# Patient Record
Sex: Male | Born: 1950 | Race: Black or African American | Hispanic: No | Marital: Married | Smoking: Former smoker
Health system: Southern US, Community
[De-identification: ages and names within clinical notes are randomized; demographics above are authoritative.]

## PROBLEM LIST (undated history)

## (undated) DIAGNOSIS — I10 Essential (primary) hypertension: Secondary | ICD-10-CM

## (undated) DIAGNOSIS — R413 Other amnesia: Secondary | ICD-10-CM

## (undated) DIAGNOSIS — E119 Type 2 diabetes mellitus without complications: Secondary | ICD-10-CM

## (undated) DIAGNOSIS — K219 Gastro-esophageal reflux disease without esophagitis: Secondary | ICD-10-CM

## (undated) DIAGNOSIS — M1990 Unspecified osteoarthritis, unspecified site: Secondary | ICD-10-CM

## (undated) HISTORY — DX: Essential (primary) hypertension: I10

## (undated) HISTORY — PX: SKIN GRAFT: SHX250

## (undated) HISTORY — PX: FRACTURE SURGERY: SHX138

## (undated) HISTORY — PX: COLONOSCOPY: SHX174

## (undated) HISTORY — PX: ABSCESS DRAINAGE: SHX1119

## (undated) HISTORY — DX: Other amnesia: R41.3

## (undated) HISTORY — PX: PILONIDAL CYST EXCISION: SHX744

## (undated) HISTORY — DX: Type 2 diabetes mellitus without complications: E11.9

---

## 2000-07-14 ENCOUNTER — Encounter: Admission: RE | Admit: 2000-07-14 | Discharge: 2000-07-14 | Payer: Self-pay | Admitting: Family Medicine

## 2000-07-14 ENCOUNTER — Encounter: Payer: Self-pay | Admitting: Family Medicine

## 2002-08-01 ENCOUNTER — Inpatient Hospital Stay (HOSPITAL_COMMUNITY): Admission: EM | Admit: 2002-08-01 | Discharge: 2002-08-03 | Payer: Self-pay | Admitting: Nurse Practitioner

## 2002-08-01 ENCOUNTER — Encounter: Payer: Self-pay | Admitting: Cardiology

## 2002-08-02 ENCOUNTER — Encounter: Payer: Self-pay | Admitting: Cardiology

## 2003-05-17 ENCOUNTER — Emergency Department (HOSPITAL_COMMUNITY): Admission: EM | Admit: 2003-05-17 | Discharge: 2003-05-17 | Payer: Self-pay | Admitting: Emergency Medicine

## 2003-05-20 ENCOUNTER — Emergency Department (HOSPITAL_COMMUNITY): Admission: EM | Admit: 2003-05-20 | Discharge: 2003-05-20 | Payer: Self-pay | Admitting: Emergency Medicine

## 2005-07-14 ENCOUNTER — Ambulatory Visit: Payer: Self-pay | Admitting: Gastroenterology

## 2005-07-22 ENCOUNTER — Encounter (INDEPENDENT_AMBULATORY_CARE_PROVIDER_SITE_OTHER): Payer: Self-pay | Admitting: *Deleted

## 2005-07-22 ENCOUNTER — Ambulatory Visit: Payer: Self-pay | Admitting: Gastroenterology

## 2009-07-21 DIAGNOSIS — K219 Gastro-esophageal reflux disease without esophagitis: Secondary | ICD-10-CM

## 2009-07-21 DIAGNOSIS — R079 Chest pain, unspecified: Secondary | ICD-10-CM | POA: Insufficient documentation

## 2010-10-10 ENCOUNTER — Observation Stay (HOSPITAL_COMMUNITY): Admission: EM | Admit: 2010-10-10 | Discharge: 2009-11-16 | Payer: Self-pay | Admitting: Emergency Medicine

## 2011-01-19 LAB — CBC
HCT: 43.4 % (ref 39.0–52.0)
Hemoglobin: 15.2 g/dL (ref 13.0–17.0)
MCHC: 34.9 g/dL (ref 30.0–36.0)
MCV: 94.8 fL (ref 78.0–100.0)
Platelets: 196 10*3/uL (ref 150–400)
RBC: 4.58 MIL/uL (ref 4.22–5.81)
RDW: 12.2 % (ref 11.5–15.5)
WBC: 10.1 10*3/uL (ref 4.0–10.5)

## 2011-01-19 LAB — BASIC METABOLIC PANEL
BUN: 8 mg/dL (ref 6–23)
CO2: 28 mEq/L (ref 19–32)
Calcium: 8.8 mg/dL (ref 8.4–10.5)
Chloride: 99 mEq/L (ref 96–112)
Creatinine, Ser: 1.02 mg/dL (ref 0.4–1.5)
GFR calc Af Amer: >60 mL/min (ref >60)
GFR calc non Af Amer: >60 mL/min (ref >60)
Glucose, Bld: 111 mg/dL — ABNORMAL HIGH (ref 70–99)
Potassium: 3.8 mEq/L (ref 3.5–5.1)
Sodium: 136 mEq/L (ref 135–145)

## 2011-01-19 LAB — POCT CARDIAC MARKERS
CKMB, poc: <1 ng/mL — ABNORMAL LOW (ref 1.0–8.0)
Myoglobin, poc: 78.5 ng/mL (ref 12–200)
Troponin i, poc: <0.05 ng/mL (ref 0.00–0.09)

## 2011-01-19 LAB — CK TOTAL AND CKMB (NOT AT ARMC)
CK, MB: 0.4 ng/mL (ref 0.3–4.0)
CK, MB: 0.4 ng/mL (ref 0.3–4.0)
Relative Index: 0.4 (ref 0.0–2.5)
Total CK: 105 U/L (ref 7–232)

## 2011-01-19 LAB — TROPONIN I: Troponin I: 0.03 ng/mL (ref 0.00–0.06)

## 2011-03-21 NOTE — Discharge Summary (Signed)
NAME:  Edward Briggs, Edward Briggs NO.:  [id number]   MEDICAL RECORD NO.:  [id number]                   PATIENT TYPE:  INP   LOCATION:  2009                                 FACILITY:  [facility]   PHYSICIAN:  Everardo Beals. Juanda Chance, M.D. [facility]           DATE OF BIRTH:  04-01-1951   DATE OF ADMISSION:  08/01/2002  DATE OF DISCHARGE:  08/03/2002                           DISCHARGE SUMMARY - REFERRING   PROCEDURES:  1. Cardiac catheterization.  2. Coronary arteriogram.  3. Left ventriculogram.  4. A 2D echocardiogram.   HOSPITAL COURSE:  The patient is a 60 year old male with no known  history  of coronary artery disease, who was admitted for intermittent chest pain  that radiated to his shoulder and tingling in his left arm. He was  admitted  to rule out MI and for further evaluation. His enzymes were negative for MI  and he was scheduled for cardiac catheterization. The cardiac  catheterization showed normal coronary arteries with an RCA which was large  and dominant. His ejection fraction was 65% with normal  wall motion. No  further ischemic workup was indicated at this time.   The patient had an echocardiogram to assess valvular function. His EF was  55% to 65%, and he had trivial tricuspid regurgitation but no significant  aortic valvular or mitral valvular regurgitation. There was no pericardial  effusion. The left atrium was mildly dilated at 42 mm with the upper limits  of normal being 40. It was not felt that any further workup was indicated  for this at this time.   The patient had been started on a proton pump inhibitor and remained symptom  free.  Post catheterization his groin was stable and he was ambulating  without difficulty. He was  considered stable for discharge on August 03, 2002.   LABORATORY DATA:  Hemoglobin 13.4, hematocrit 40.1, WBCs 9.5, platelets 216.  Sodium 138, potassium 3.9, chloride 105, CO2 27, BUN 10, creatinine 1.3.  Total cholesterol  161, triglycerides 108, HDL 43, LDL 96.   Chest x-ray:  Heart size and vascularity are normal and the lungs are clear  with no significant bony abnormality.   CONDITION ON DISCHARGE:  Stable.   DISCHARGE DIAGNOSES:  1. Chest pain, possibility secondary to reflux symptoms with no     abnormalities on chest x-ray or cardiac catheterization.  2. Possible gastroesophageal reflux disease symptoms.   DISCHARGE INSTRUCTIONS:  His activity  level is to include no driving,  sexual or strenuous activity for two days. He is to stick to a low fat diet.  He is to call  the office for problems with the catheterization site.    FOLLOW UP:  He has a followup appointment with the P.A. for a groin check on  August 15, 2002, at 10 a.m. He is to see Dr. Margrett Rud for followup.   DISCHARGE MEDICATIONS:  Protonix 40 mg q.d.  Lavella Hammock, P.A. [facility]                  Bruce R. Juanda Chance, M.D. [facility]    RG/MEDQ  D:  08/03/2002  T:  08/06/2002  Job:  [id number]   cc:   Vale Haven. Andrey Campanile, M.D.   Bruce Elvera Lennox Juanda Chance, M.D. [facility]  [address]  [place]  [place] [address]

## 2011-03-21 NOTE — H&P (Signed)
NAME:  Edward Briggs, Edward Briggs NO.:  [id number]   MEDICAL RECORD NO.:  [id number]                   PATIENT TYPE:  INP   LOCATION:  1830                                 FACILITY:  [facility]   PHYSICIAN:  Everardo Beals. Juanda Chance, M.D. [facility]           DATE OF BIRTH:  1951-09-18   DATE OF ADMISSION:  08/01/2002  DATE OF DISCHARGE:                                HISTORY & PHYSICAL   CHIEF COMPLAINT:  Left shoulder, jaw, and chest pain.   CLINICAL HISTORY:  Mr. Meiklejohn is a 60 year old gentleman with no prior history  of known heart disease. Over the past two weeks, he has had intermittent  pain in his left jaw and left shoulder and some pain in his mid to left  chest. This comes on without any provocation and lasts about 10 minutes.  Sometimes his symptoms can be in his jaw, sometimes in his shoulder, and  sometimes in his chest. There is no relation to exertion. There is no  associated shortness of breath, diaphoresis, or nausea. He saw Dr. Earl Lites in Urgent Care today, and he performed an EKG which showed ST elevation  in V1 and V2 and some lateral depression in V4 to V6. He was concerned his  symptoms might represent unstable angina and arranged for him to come to the  emergency room and be seen by [place]. He was not having any pain bilateral in  the emergency room.   PAST MEDICAL HISTORY:  Negative for any major illnesses, and he is currently  taking no medications. There is no history of hypertension, diabetes, or  hyperlipidemia and no prior surgeries.   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  He quit smoking about 15 years ago. For details of social history, family  history, and review of systems, please see Annette Stable Yearn's complete note.   PHYSICAL EXAMINATION:  VITAL SIGNS:  Blood pressure 120/80, pulse 70 and  regular. There was no venous distention. Carotid pulses were full without  bruits.  CHEST:  Clear. There were no rales or  rhonchi.  CARDIAC:  Rhythm was regular. The first and second heart sounds were normal,  and there were no murmurs or gallops.  ABDOMEN:  Soft without organomegaly. No pulsatile masses, and the bowel  sounds were normal.  EXTREMITIES:  The peripheral pulses were full, and there was no peripheral  edema.  MUSCULOSKELETAL SYSTEM:  Shows no deformities.  SKIN:  Warm and dry.  NEUROLOGIC EXAM:  Showed no focal signs.   LABORATORY DATA:  His EKG showed slight ST elevation of 1 to 1.5 mm in V1 to  V3 and lateral T wave inversions from V4 to V6 and in III and aVF.   IMPRESSION:  Chest, shoulder, and jaw pain and abnormal EKG-possible  unstable angina, rule out other causes.    RECOMMENDATIONS:  We will plan  to admit the patient for observation. We will  get serial enzymes and ECGs. We will evaluate the patient with an  echocardiogram to rule out a cardiomyopathy and will plan to evaluate him  with cardiac catheterization tomorrow to rule out ischemic heart disease. We  will treat him with Lovenox and aspirin in the interim.                                                 Bruce Elvera Lennox Juanda Chance, M.D. [facility]    BRB/MEDQ  D:  08/01/2002  T:  08/03/2002  Job:  [id number]   cc:   Brett Canales A. Cleta Alberts, M.D.   Cardiopulmonary lab   [facility]

## 2011-03-21 NOTE — Cardiovascular Report (Signed)
   NAME:  Edward Briggs, Edward Briggs NO.:  [id number]   MEDICAL RECORD NO.:  [id number]                   PATIENT TYPE:  INP   LOCATION:  2009                                 FACILITY:  [facility]   PHYSICIAN:  Rollene Rotunda, M.D. [facility]            DATE OF BIRTH:  Mar 17, 1951   DATE OF PROCEDURE:  08/02/2002  DATE OF DISCHARGE:                              CARDIAC CATHETERIZATION   PROCEDURE:  Left heart catheterization/coronary arteriography.   INDICATION:  Evaluate patient with chest pain and an abnormal EKG.   PROCEDURAL NOTE:  Left heart catheterization was performed via the right  femoral artery.  The artery was cannulated using anterior wall puncture.  A  #6-French arterial sheath was inserted via the modified Seldinger technique.  Preformed Judkins and a pigtail catheter were utilized.  The patient  tolerated the procedure well and left the lab in stable condition.   RESULTS:  Hemodynamics:  LV 144/25, AO 148/100.   Coronaries:  The left main was normal.  The LAD was normal.  The circumflex  was normal.  The right coronary artery was a large dominant vessel and was  normal.   Left ventriculogram:  The left ventriculogram was obtained in the RAO  projection.  The EF was 65% with normal wall motion.   CONCLUSION:  Normal coronary arteries.  Normal left ventricular function.   PLAN:  No further cardiac workup is planned.  The patient will be encouraged  to follow up with his primary care physician for evaluation of recurrent  chest discomfort, which is obviously of non-anginal etiology.                                               Rollene Rotunda, M.D. [facility]    JH/MEDQ  D:  08/02/2002  T:  08/06/2002  Job:  [id number]   cc:   Brett Canales A. Cleta Alberts, M.D.

## 2011-11-17 ENCOUNTER — Emergency Department (HOSPITAL_COMMUNITY): Payer: Worker's Compensation

## 2011-11-17 ENCOUNTER — Encounter (HOSPITAL_COMMUNITY): Payer: Self-pay

## 2011-11-17 ENCOUNTER — Emergency Department (HOSPITAL_COMMUNITY)
Admission: EM | Admit: 2011-11-17 | Discharge: 2011-11-17 | Disposition: A | Discharge status: Home / Self Care | Payer: Worker's Compensation | Attending: Emergency Medicine | Admitting: Emergency Medicine

## 2011-11-17 DIAGNOSIS — X500XXA Overexertion from strenuous movement or load, initial encounter: Secondary | ICD-10-CM | POA: Insufficient documentation

## 2011-11-17 DIAGNOSIS — M25476 Effusion, unspecified foot: Secondary | ICD-10-CM | POA: Insufficient documentation

## 2011-11-17 DIAGNOSIS — M25473 Effusion, unspecified ankle: Secondary | ICD-10-CM | POA: Insufficient documentation

## 2011-11-17 DIAGNOSIS — R269 Unspecified abnormalities of gait and mobility: Secondary | ICD-10-CM | POA: Insufficient documentation

## 2011-11-17 DIAGNOSIS — M25579 Pain in unspecified ankle and joints of unspecified foot: Secondary | ICD-10-CM | POA: Insufficient documentation

## 2011-11-17 DIAGNOSIS — S8253XA Displaced fracture of medial malleolus of unspecified tibia, initial encounter for closed fracture: Secondary | ICD-10-CM

## 2011-11-17 MED ORDER — OXYCODONE-ACETAMINOPHEN 5-325 MG PO TABS: 1.0000 | ORAL_TABLET | Freq: Four times a day (QID) | ORAL | Status: AC | PRN

## 2011-11-17 MED ORDER — HYDROMORPHONE HCL PF 2 MG/ML IJ SOLN
1.0000 mg | Freq: Once | INTRAMUSCULAR | Status: AC
  Administered 2011-11-17: 1 mg via INTRAMUSCULAR
  Filled 2011-11-17: qty 1

## 2011-11-17 NOTE — Progress Notes (Signed)
Orthopedic Tech Progress Note Patient Details:  Edward Briggs Sep 23, 1951 [id number]  Type of Splint: Stirrup;Post (short) Splint Location: applied prefab splint to left leg Splint Interventions: Application crutches   Gaye Pollack 11/17/2011, 10:21 AM

## 2011-11-17 NOTE — ED Notes (Signed)
Pt transported to and from radiology on stretcher with tech and tolerated well. 

## 2011-11-17 NOTE — ED Notes (Signed)
Pt presents with swelling and pain to L ankle after slipping in water at work.  Pt reports turning ankle.  +pedal pulse to L foot, +sensation and movement.  Pt reports he is unable to bear weight.  +swelling from L foot up to L knee that is pitting.

## 2011-11-17 NOTE — ED Notes (Signed)
Ice applied while pt awaiting imaging result.

## 2011-11-17 NOTE — ED Notes (Signed)
Pt presents with L ankle and foot pain after slipping in water in parking deck at work.  Pt unable to bear weight.

## 2011-11-17 NOTE — ED Provider Notes (Signed)
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 States his walking and somehow tripped. Reports twisting his ankle inward. Reports immediately begin to swell and was unable to bear weight on his ankle. Denies numbness, tingling. Patient is a 61 y.o. male presenting with ankle pain. The history is provided by the patient.  Ankle Pain  The incident occurred 1 to 2 hours ago. The incident occurred at work. The injury mechanism was a fall. The pain is present in the left ankle. The pain is severe. Associated symptoms include inability to bear weight and loss of motion. Pertinent negatives include no numbness, no muscle weakness, no loss of sensation and no tingling. He reports no foreign bodies present. The symptoms are aggravated by activity, bearing weight and palpation.    History reviewed. No pertinent past medical history.  Past Surgical History  Procedure Date  . Skin graft   . Abscess drainage     No family history on file.  History  Substance Use Topics  . Smoking status: Former Games developer  . Smokeless tobacco: Not on file  . Alcohol Use: Yes      Review of Systems  Musculoskeletal: Positive for joint swelling and gait problem. Negative for back pain.       Ankle pain and swelling   Skin: Negative for color change and wound.  Neurological: Negative for tingling, weakness and numbness.  All other systems reviewed and are negative.    Allergies  Review of patient's allergies indicates no known allergies.  Home Medications   Current Outpatient Rx  Name Route Sig Dispense Refill  . NAPROXEN SODIUM 220 MG PO TABS Oral Take 440 mg by mouth 2 (two) times daily as needed. For pain    . PSEUDOEPHEDRINE-GUAIFENESIN ER 60-600 MG PO TB12 Oral Take 1 tablet by mouth daily as needed. For congestion with cold    . OXYCODONE-ACETAMINOPHEN 5-325  MG PO TABS Oral Take 1 tablet by mouth every 6 (six) hours as needed for pain. 30 tablet 0    BP 149/90  Pulse 86  Temp(Src) 97.5 F (36.4 C) (Oral)  Resp 14  SpO2 99%  Physical Exam  Constitutional: He is oriented to person, place, and time. He appears well-developed and well-nourished.  HENT:  Head: Normocephalic and atraumatic.  Eyes: Pupils are equal, round, and reactive to light.  Musculoskeletal:       Left ankle: He exhibits decreased range of motion and swelling. He exhibits no ecchymosis, no deformity and normal pulse. tenderness. Medial malleolus and AITFL tenderness found. No lateral malleolus, no CF ligament, no posterior TFL, no head of 5th metatarsal and no proximal fibula tenderness found. Achilles tendon normal.       Feet:  Neurological: He is alert and oriented to person, place, and time.  Skin: Skin is warm and dry. No rash noted. No erythema. No pallor.  Psychiatric: He has a normal mood and affect. His behavior is normal.    ED Course  Procedures   Labs Reviewed - No data to display Dg Ankle Complete Left  11/17/2011  *RADIOLOGY REPORT*  Clinical Data: Twisting injury, swelling, pain.  LEFT ANKLE COMPLETE - 3+ VIEW  Comparison: None.  Findings: There is a transverse fracture through the medial malleolus.  Widening of the medial ankle mortise noted.  No fibular abnormality seen.  Overlying soft tissue swelling.  IMPRESSION: Transverse fracture through the medial malleolus with widening of the joint space medially.  Original Report Authenticated By: Cyndie Chime, M.D.     1. Fracture of medial malleolus, closed       MDM  Patient placed in a posterior/stirrup splint recommended orthopedic physician after swelling has decreased. Patient's family agree with plan to.       Thomasene Lot, PA-C 11/17/11 [phone]

## 2011-11-18 NOTE — ED Provider Notes (Signed)
Medical screening examination/treatment/procedure(s) were performed by non-physician practitioner and as supervising physician I was immediately available for consultation/collaboration.    Makana Rostad R Analiza Cowger, MD 11/18/11 0707 

## 2011-12-01 ENCOUNTER — Encounter (HOSPITAL_BASED_OUTPATIENT_CLINIC_OR_DEPARTMENT_OTHER): Payer: Self-pay | Admitting: *Deleted

## 2011-12-02 ENCOUNTER — Other Ambulatory Visit: Payer: Self-pay | Admitting: Physician Assistant

## 2011-12-03 ENCOUNTER — Encounter (HOSPITAL_BASED_OUTPATIENT_CLINIC_OR_DEPARTMENT_OTHER): Payer: Self-pay | Admitting: Anesthesiology

## 2011-12-03 ENCOUNTER — Ambulatory Visit (HOSPITAL_BASED_OUTPATIENT_CLINIC_OR_DEPARTMENT_OTHER)
Admission: RE | Admit: 2011-12-03 | Discharge: 2011-12-03 | Disposition: A | Discharge status: Home / Self Care | Payer: Worker's Compensation | Source: Ambulatory Visit | Attending: Orthopedic Surgery | Admitting: Orthopedic Surgery

## 2011-12-03 ENCOUNTER — Encounter (HOSPITAL_BASED_OUTPATIENT_CLINIC_OR_DEPARTMENT_OTHER)
Admission: RE | Disposition: A | Discharge status: Home / Self Care | Payer: Self-pay | Source: Ambulatory Visit | Attending: Orthopedic Surgery

## 2011-12-03 ENCOUNTER — Ambulatory Visit (HOSPITAL_BASED_OUTPATIENT_CLINIC_OR_DEPARTMENT_OTHER): Payer: Worker's Compensation | Admitting: Anesthesiology

## 2011-12-03 DIAGNOSIS — Y9269 Other specified industrial and construction area as the place of occurrence of the external cause: Secondary | ICD-10-CM | POA: Insufficient documentation

## 2011-12-03 DIAGNOSIS — S8253XA Displaced fracture of medial malleolus of unspecified tibia, initial encounter for closed fracture: Secondary | ICD-10-CM | POA: Insufficient documentation

## 2011-12-03 DIAGNOSIS — S93439A Sprain of tibiofibular ligament of unspecified ankle, initial encounter: Secondary | ICD-10-CM | POA: Insufficient documentation

## 2011-12-03 DIAGNOSIS — Y990 Civilian activity done for income or pay: Secondary | ICD-10-CM | POA: Insufficient documentation

## 2011-12-03 DIAGNOSIS — X500XXA Overexertion from strenuous movement or load, initial encounter: Secondary | ICD-10-CM | POA: Insufficient documentation

## 2011-12-03 DIAGNOSIS — S82409A Unspecified fracture of shaft of unspecified fibula, initial encounter for closed fracture: Secondary | ICD-10-CM | POA: Insufficient documentation

## 2011-12-03 DIAGNOSIS — K219 Gastro-esophageal reflux disease without esophagitis: Secondary | ICD-10-CM | POA: Insufficient documentation

## 2011-12-03 DIAGNOSIS — S82892A Other fracture of left lower leg, initial encounter for closed fracture: Secondary | ICD-10-CM

## 2011-12-03 HISTORY — DX: Gastro-esophageal reflux disease without esophagitis: K21.9

## 2011-12-03 HISTORY — PX: ORIF ANKLE FRACTURE: SHX5408

## 2011-12-03 SURGERY — OPEN REDUCTION INTERNAL FIXATION (ORIF) ANKLE FRACTURE
Anesthesia: General | Site: Leg Lower | Laterality: Left | Wound class: Clean

## 2011-12-03 MED ORDER — CEFAZOLIN SODIUM 1-5 GM-% IV SOLN: 1.0000 g | INTRAVENOUS | Status: DC

## 2011-12-03 MED ORDER — VITAMIN C 500 MG PO TABS: 500.0000 mg | ORAL_TABLET | Freq: Every day | ORAL | Status: AC

## 2011-12-03 MED ORDER — DEXAMETHASONE SODIUM PHOSPHATE 4 MG/ML IJ SOLN
INTRAMUSCULAR | Status: DC | PRN
  Administered 2011-12-03: 10 mg via INTRAVENOUS

## 2011-12-03 MED ORDER — MIDAZOLAM HCL 2 MG/2ML IJ SOLN
1.0000 mg | INTRAMUSCULAR | Status: DC | PRN
  Administered 2011-12-03: 2 mg via INTRAVENOUS

## 2011-12-03 MED ORDER — METHOCARBAMOL 500 MG PO TABS: 500.0000 mg | ORAL_TABLET | Freq: Three times a day (TID) | ORAL | Status: AC

## 2011-12-03 MED ORDER — PROPOFOL 10 MG/ML IV EMUL
INTRAVENOUS | Status: DC | PRN
  Administered 2011-12-03: 250 mg via INTRAVENOUS

## 2011-12-03 MED ORDER — OXYCODONE-ACETAMINOPHEN 5-325 MG PO TABS
1.0000 | ORAL_TABLET | Freq: Once | ORAL | Status: AC
  Administered 2011-12-03: 2 via ORAL

## 2011-12-03 MED ORDER — SODIUM CHLORIDE 0.9 % IV SOLN: INTRAVENOUS | Status: DC

## 2011-12-03 MED ORDER — HYDROMORPHONE HCL PF 1 MG/ML IJ SOLN
0.2500 mg | INTRAMUSCULAR | Status: DC | PRN
  Administered 2011-12-03 (×3): 0.5 mg via INTRAVENOUS

## 2011-12-03 MED ORDER — FENTANYL CITRATE 0.05 MG/ML IJ SOLN
50.0000 ug | INTRAMUSCULAR | Status: DC | PRN
  Administered 2011-12-03: 100 ug via INTRAVENOUS

## 2011-12-03 MED ORDER — CEFAZOLIN SODIUM-DEXTROSE 2-3 GM-% IV SOLR
2.0000 g | INTRAVENOUS | Status: AC
  Administered 2011-12-03: 2 g via INTRAVENOUS

## 2011-12-03 MED ORDER — CHLORHEXIDINE GLUCONATE 4 % EX LIQD: 60.0000 mL | Freq: Once | CUTANEOUS | Status: DC

## 2011-12-03 MED ORDER — PROMETHAZINE HCL 25 MG/ML IJ SOLN: 6.2500 mg | INTRAMUSCULAR | Status: DC | PRN

## 2011-12-03 MED ORDER — LACTATED RINGERS IV SOLN
INTRAVENOUS | Status: DC
  Administered 2011-12-03 (×3): via INTRAVENOUS

## 2011-12-03 MED ORDER — FENTANYL CITRATE 0.05 MG/ML IJ SOLN
INTRAMUSCULAR | Status: DC | PRN
Start: 2011-12-03 — End: 2011-12-03
  Administered 2011-12-03: 25 ug via INTRAVENOUS
  Administered 2011-12-03: 100 ug via INTRAVENOUS
  Administered 2011-12-03: 25 ug via INTRAVENOUS
  Administered 2011-12-03: 50 ug via INTRAVENOUS

## 2011-12-03 MED ORDER — BUPIVACAINE HCL (PF) 0.5 % IJ SOLN
INTRAMUSCULAR | Status: DC | PRN
  Administered 2011-12-03: 10 mL

## 2011-12-03 MED ORDER — ONDANSETRON HCL 4 MG/2ML IJ SOLN
INTRAMUSCULAR | Status: DC | PRN
  Administered 2011-12-03: 4 mg via INTRAVENOUS

## 2011-12-03 MED ORDER — ASPIRIN EC 325 MG PO TBEC: 325.0000 mg | DELAYED_RELEASE_TABLET | Freq: Two times a day (BID) | ORAL | Status: AC

## 2011-12-03 MED ORDER — OXYCODONE-ACETAMINOPHEN 5-325 MG PO TABS
1.0000 | ORAL_TABLET | ORAL | Status: DC | PRN
  Administered 2011-12-03: 1 via ORAL

## 2011-12-03 MED ORDER — BUPIVACAINE-EPINEPHRINE PF 0.5-1:200000 % IJ SOLN
INTRAMUSCULAR | Status: DC | PRN
  Administered 2011-12-03: 30 mL

## 2011-12-03 SURGICAL SUPPLY — 71 items
BANDAGE ELASTIC 4 VELCRO ST LF (GAUZE/BANDAGES/DRESSINGS) IMPLANT
BANDAGE ELASTIC 6 VELCRO ST LF (GAUZE/BANDAGES/DRESSINGS) ×3 IMPLANT
BIT DRILL 2.5X110 QC LCP DISP (BIT) ×3 IMPLANT
BIT DRILL Q/COUPLING 1 (BIT) ×3 IMPLANT
BIT DRILL QC 3.5X195 (BIT) IMPLANT
BLADE SURG 15 STRL LF DISP TIS (BLADE) ×8 IMPLANT
BLADE SURG 15 STRL SS (BLADE) ×4
BRUSH SCRUB EZ PLAIN DRY (MISCELLANEOUS) ×3 IMPLANT
BUR EGG 3PK/BX (BURR) IMPLANT
CLOTH BEACON ORANGE TIMEOUT ST (SAFETY) ×3 IMPLANT
COTTON STERILE ROLL (GAUZE/BANDAGES/DRESSINGS) ×3 IMPLANT
COVER TABLE BACK 60X90 (DRAPES) ×3 IMPLANT
CUFF TOURNIQUET SINGLE 34IN LL (TOURNIQUET CUFF) ×3 IMPLANT
DRAPE EXTREMITY T 121X128X90 (DRAPE) ×3 IMPLANT
DRAPE OEC MINIVIEW 54X84 (DRAPES) ×3 IMPLANT
DRAPE SURG 17X23 STRL (DRAPES) ×3 IMPLANT
DRSG PAD ABDOMINAL 8X10 ST (GAUZE/BANDAGES/DRESSINGS) IMPLANT
DURA STEPPER LG (CAST SUPPLIES) IMPLANT
DURA STEPPER MED (CAST SUPPLIES) IMPLANT
ELECT REM PT RETURN 9FT ADLT (ELECTROSURGICAL) ×3
ELECTRODE REM PT RTRN 9FT ADLT (ELECTROSURGICAL) ×2 IMPLANT
GAUZE SPONGE 4X4 16PLY XRAY LF (GAUZE/BANDAGES/DRESSINGS) IMPLANT
GAUZE XEROFORM 1X8 LF (GAUZE/BANDAGES/DRESSINGS) ×3 IMPLANT
GAUZE XEROFORM 5X9 LF (GAUZE/BANDAGES/DRESSINGS) IMPLANT
GLOVE BIO SURGEON STRL SZ 6.5 (GLOVE) ×3 IMPLANT
GLOVE BIO SURGEON STRL SZ8 (GLOVE) ×3 IMPLANT
GLOVE BIOGEL PI IND STRL 8 (GLOVE) ×4 IMPLANT
GLOVE BIOGEL PI INDICATOR 8 (GLOVE) ×2
GLOVE INDICATOR 7.0 STRL GRN (GLOVE) ×3 IMPLANT
GLOVE SURG SS PI 8.0 STRL IVOR (GLOVE) ×3 IMPLANT
GOWN BRE IMP PREV XXLGXLNG (GOWN DISPOSABLE) ×6 IMPLANT
GOWN PREVENTION PLUS XLARGE (GOWN DISPOSABLE) ×3 IMPLANT
K-WIRE 2.0X150M (WIRE) ×3
KIT 1/3 TUB PL 4H 49M (Orthopedic Implant) ×2 IMPLANT
KWIRE 2.0X150M (WIRE) ×2 IMPLANT
NEEDLE HYPO 22GX1.5 SAFETY (NEEDLE) IMPLANT
NS IRRIG 1000ML POUR BTL (IV SOLUTION) ×3 IMPLANT
PACK BASIN DAY SURGERY FS (CUSTOM PROCEDURE TRAY) ×3 IMPLANT
PAD CAST 4YDX4 CTTN HI CHSV (CAST SUPPLIES) ×2 IMPLANT
PADDING CAST ABS 4INX4YD NS (CAST SUPPLIES) ×1
PADDING CAST ABS COTTON 4X4 ST (CAST SUPPLIES) ×2 IMPLANT
PADDING CAST COTTON 4X4 STRL (CAST SUPPLIES) ×1
PENCIL BUTTON HOLSTER BLD 10FT (ELECTRODE) ×3 IMPLANT
PROS 1/3 TUB PL 4H 49M (Orthopedic Implant) ×3 IMPLANT
SCREW CORT ST 4.5X50 (Screw) ×3 IMPLANT
SCREW CORTEX 3.5 34MM (Screw) ×1 IMPLANT
SCREW CORTEX 3.5 38MM (Screw) ×1 IMPLANT
SCREW CORTEX ST 4.5X60 (Screw) ×3 IMPLANT
SCREW CORTEX ST 4.5X62 (Screw) IMPLANT
SCREW LOCK CORT ST 3.5X34 (Screw) ×2 IMPLANT
SCREW LOCK CORT ST 3.5X38 (Screw) ×2 IMPLANT
SHEET MEDIUM DRAPE 40X70 STRL (DRAPES) ×6 IMPLANT
SLEEVE SCD COMPRESS KNEE MED (MISCELLANEOUS) ×3 IMPLANT
SPLINT FAST PLASTER 5X30 (CAST SUPPLIES) ×20
SPLINT PLASTER CAST FAST 5X30 (CAST SUPPLIES) ×40 IMPLANT
SPONGE GAUZE 4X4 12PLY (GAUZE/BANDAGES/DRESSINGS) ×3 IMPLANT
STOCKINETTE 6  STRL (DRAPES) ×1
STOCKINETTE 6 STRL (DRAPES) ×2 IMPLANT
SUCTION FRAZIER TIP 10 FR DISP (SUCTIONS) ×3 IMPLANT
SUT ETHILON 3 0 PS 1 (SUTURE) IMPLANT
SUT ETHILON 4 0 PS 2 18 (SUTURE) ×9 IMPLANT
SUT VIC AB 2-0 SH 27 (SUTURE)
SUT VIC AB 2-0 SH 27XBRD (SUTURE) IMPLANT
SUT VIC AB 3-0 PS1 18 (SUTURE) ×2
SUT VIC AB 3-0 PS1 18XBRD (SUTURE) ×4 IMPLANT
SYR BULB 3OZ (MISCELLANEOUS) ×3 IMPLANT
TOWEL OR 17X24 6PK STRL BLUE (TOWEL DISPOSABLE) ×9 IMPLANT
TOWEL OR NON WOVEN STRL DISP B (DISPOSABLE) ×3 IMPLANT
TUBE CONNECTING 20X1/4 (TUBING) ×6 IMPLANT
UNDERPAD 30X30 INCONTINENT (UNDERPADS AND DIAPERS) ×3 IMPLANT
WATER STERILE IRR 1000ML POUR (IV SOLUTION) ×3 IMPLANT

## 2011-12-03 NOTE — Anesthesia Procedure Notes (Addendum)
Anesthesia Regional Block:  Popliteal block  Pre-Anesthetic Checklist: ,, timeout performed, Correct Patient, Correct Site, Correct Laterality, Correct Procedure, Correct Position, site marked, Risks and benefits discussed,  Surgical consent,  Pre-op evaluation,  At surgeon's request and post-op pain management  Laterality: Left  Prep: chloraprep       Needles:  Injection technique: Single-shot  Needle Type: Stimulator Needle - 80      Needle Gauge: 22 and 22 G    Additional Needles:  Procedures: nerve stimulator Popliteal block  Nerve Stimulator or Paresthesia:  Response: toe abduction, 0.45 mA, 0.1 ms,   Additional Responses:   Narrative:  Start time: 12/03/2011 10:40 AM End time: 12/03/2011 10:46 AM Injection made incrementally with aspirations every 5 mL.  Performed by: Personally  Anesthesiologist: Sandford Craze, MD  Additional Notes: Pt identified in Holding room.  Monitors applied. Working IV access confirmed. Sterile prep.  #22ga PNS to toe twitch at 0.16mA threshold.  30cc 0.5% Bupivacaine with 1:200k epi injected incrementally after negative test dose.  Additional 10cc 0.5% Bupivacaine injected subq at prox, ant-med tibia for saphenous nerve supplementation. Patient asymptomatic, VSS, no heme aspirated, tolerated well.   Sandford Craze, MD   Procedure Name: LMA Insertion Date/Time: 12/03/2011 10:56 AM Performed by: Jearld Shines Pre-anesthesia Checklist: Patient identified, Emergency Drugs available, Suction available and Patient being monitored Patient Re-evaluated:Patient Re-evaluated prior to inductionOxygen Delivery Method: Circle System Utilized Preoxygenation: Pre-oxygenation with 100% oxygen Intubation Type: IV induction Ventilation: Mask ventilation without difficulty LMA: LMA with gastric port inserted LMA Size: 4.0 and 5.0 Number of attempts: 1 Airway Equipment and Method: bite block Placement Confirmation: positive ETCO2 Tube secured with: Tape Dental  Injury: Teeth and Oropharynx as per pre-operative assessment

## 2011-12-03 NOTE — Progress Notes (Signed)
Assisted Dr. Jackson with left, popliteal block. Side rails up, monitors on throughout procedure. See vital signs in flow sheet. Tolerated Procedure well. 

## 2011-12-03 NOTE — Brief Op Note (Signed)
12/03/2011  11:11 AM  PATIENT:  Edward Briggs  61 y.o. male  PRE-OPERATIVE DIAGNOSIS:  left medial malleolus fracture  POST-OPERATIVE DIAGNOSIS:  * No post-op diagnosis entered *  PROCEDURE:  Procedure(s): OPEN REDUCTION INTERNAL FIXATION (ORIF) ANKLE FRACTURE OPEN REDUCTION INTERNAL FIXATION (ORIF) TIBIA/FIBULA FRACTURE  SURGEON:  Surgeon(s): Sherri Rad, MD  PHYSICIAN ASSISTANT: Rexene Edison, PAC  ASSISTANTS: Above   ANESTHESIA:   general  EBL:  Total I/O In: 200 [I.V.:200] Out: -   BLOOD ADMINISTERED:none  DRAINS: none   LOCAL MEDICATIONS USED:  NONE  SPECIMEN:  No Specimen  DISPOSITION OF SPECIMEN:  N/A  COUNTS:  YES  TOURNIQUET:  * Missing tourniquet times found for documented tourniquets in log:  21315 *  DICTATION: .Other Dictation: Dictation Number [id number]  PLAN OF CARE: Discharge to home after PACU  PATIENT DISPOSITION:  PACU - hemodynamically stable.   Delay start of Pharmacological VTE agent (>24hrs) due to surgical blood loss or risk of bleeding:  {YES/NO/NOT APPLICABLE:20182

## 2011-12-03 NOTE — Anesthesia Preprocedure Evaluation (Signed)
Anesthesia Evaluation  Patient identified by MRN, date of birth, ID band Patient awake    Reviewed: Allergy & Precautions, H&P , NPO status , Patient's Chart, lab work & pertinent test results  History of Anesthesia Complications Negative for: history of anesthetic complications  Airway       Dental   Pulmonary former smoker         Cardiovascular neg cardio ROS  ECHO '03- normal LVF, normal valves   Neuro/Psych Negative Neurological ROS     GI/Hepatic Neg liver ROS, GERD-  Controlled,  Endo/Other  Morbid obesity  Renal/GU negative Renal ROS     Musculoskeletal   Abdominal   Peds  Hematology   Anesthesia Other Findings   Reproductive/Obstetrics                           Anesthesia Physical Anesthesia Plan  ASA: II  Anesthesia Plan: General   Post-op Pain Management:    Induction: Intravenous  Airway Management Planned: LMA  Additional Equipment:   Intra-op Plan:   Post-operative Plan:   Informed Consent: I have reviewed the patients History and Physical, chart, labs and discussed the procedure including the risks, benefits and alternatives for the proposed anesthesia with the patient or authorized representative who has indicated his/her understanding and acceptance.   Dental advisory given  Plan Discussed with: CRNA and Surgeon  Anesthesia Plan Comments: (Plan routine monitors, GA- LMA OK, popliteal block for post op analgesia)        Anesthesia Quick Evaluation

## 2011-12-03 NOTE — Transfer of Care (Signed)
Immediate Anesthesia Transfer of Care Note  Patient: Edward Briggs  Procedure(s) Performed:  OPEN REDUCTION INTERNAL FIXATION (ORIF) ANKLE FRACTURE - left medial malleolus fracture; OPEN REDUCTION INTERNAL FIXATION (ORIF) TIBIA/FIBULA FRACTURE - distal tibial/fibula, syndesmosis closed fracture with manipulation of fibula shaft   Patient Location: PACU  Anesthesia Type: GA combined with regional for post-op pain  Level of Consciousness: sedated  Airway & Oxygen Therapy: Patient Spontanous Breathing and Patient connected to face mask oxygen  Post-op Assessment: Report given to PACU RN and Post -op Vital signs reviewed and stable  Post vital signs: Reviewed and stable  Complications: No apparent anesthesia complications

## 2011-12-03 NOTE — Op Note (Signed)
NAME:  JEFFERY, BACHMEIER NO.:  MEDICAL RECORD NO.:  [id number]  LOCATION:                                 FACILITY:  PHYSICIAN:  Leonides Grills, M.D.     DATE OF BIRTH:  11/02/1951  DATE OF PROCEDURE:  12/03/2011 DATE OF DISCHARGE:                              OPERATIVE REPORT   PREOPERATIVE DIAGNOSES: 1. Left medial malleolus fracture. 2. Left fibular shaft fracture. 3. Left distal tibiofibular syndesmotic rupture.  POSTOPERATIVE DIAGNOSES: 1. Left medial malleolus fracture. 2. Left fibular shaft fracture. 3. Left distal tibiofibular syndesmotic rupture.  OPERATION: 1. Open reduction and internal fixation of left medial malleolus     fracture. 2. Open reduction and internal fixation of left distal tibiofibular     syndesmotic rupture. 3. Closed treatment with manipulation, left fibular shaft fracture. 4. Stress x-ray, ankle.  ANESTHESIA:  General.  SURGEON:  Leonides Grills, MD  ASSISTANT:  Richardean Canal, PA-C.  First assistant was used throughout the case for aid and visualization, suction, aid, reduction, fixation, closure, and dressing application.  ESTIMATED BLOOD LOSS:  Minimal.  TOURNIQUET TIME:  Approximately an hour.  COMPLICATIONS:  None.  DISPOSITION:  Stable to PR.  INDICATION:  This is a 61 year old gentleman who while at work twisted his ankle and sustained the above injury.  He was consented for the procedure.  All risks of infection, nerve or vessel injury, nonunion, malunion, hardware rotation, hardware failure, persistent pain, worsening pain, prolonged recovery, stiffness, arthritis, wound healing problems, DVT, PE, and the fact that the hardware will be removed at a later date likely were all explained.  Specifically, the syndesmotic risks were all explained.  Questions were encouraged and answered.  OPERATION:  The patient was brought to the operating room and placed in supine position after adequate general anesthesia  was administered with popliteal block as well as Ancef 1 g IV piggyback.  Bump was placed on the left ipsilateral hip internally rotating the left lower extremity. All bony prominences were well padded.  Left lower extremity was then prepped and draped in a sterile manner over a proximally thigh tourniquet.  Limb was gravity exsanguinated.  Tourniquet was elevated to 290 mmHg.  A longitudinal incision over the left medial malleolus was then made.  Dissection was carried down through the skin.  Hemostasis was obtained.  Fracture site was then encountered.  This had a more distal fracture and extended into a portion of the tibial plafond anteromedially.  Once the soft tissue and hematoma as well as periosteum from the fracture site was removed and the fracture site was opened, the joint was copiously irrigated with normal saline as was the fracture site.  There was no obvious osteochondral lesion.  We then anatomically reduced the fracture with a two-point reduction clamp and a dental pick. Once this was anatomically reduced, we then obtained an stress x-ray to verify that this was in the proper position and also too there was still lateral subluxation of talus without any warts.  Once this was anatomically reduced, we then placed a K-wire across the fracture site to hold this in its desired position.  This  was placed anteriorly.  We then placed a screw on the posterior aspect.  This was a 3.5-mm fully- threaded cortical set screw using a 2.5-mm drill hole respectively. This had excellent purchase and maintenance of the desired position.  K- wire was removed and a 3.5-mm fully-threaded cortical set screw was placed using 3.5 and 2.5 mm drill hole respectively.  Again, this had excellent purchase and maintenance of the desired position.  The clamp was removed.  We then obtained stress x-rays on AP, lateral, mortise views that showed no gross motion of fixation, proper position, and excellent  alignment as well.  The fracture was anatomic.  We then made a longitudinal over the lateral malleolus on distal fibular region. Dissection was carried down through skin.  Hemostasis was obtained. Full-thickness flap was then done.  A 4-hole one-third tubular plate was applied in poster0lateral portion of the lateral malleolus.  We then using a Darrick Penna clamp reduced the distal tib-fib syndesmosis.  Once this was anatomically reduced, we verified this under C-arm guidance to be in the proper position on AP and lateral planes.  The lateral subluxation of the talus within the ankle mortise was reduced and the thigh stasis between the distal tib-fib syndesmosis was also reduced as well.  We then placed a 4.5-mm fully-threaded cortical set screw using a 3.2-mm drill hole respectively.  This had excellent purchase and maintenance of the desired position.  This was a distal screw most screw and 3-cortical screw.  This was done because of the fact that this was abutting the medial screws in the medial malleolus.  We then removed the reduction clamp and then placed another 4.5 screw, these were all 4.5 mm fully-threaded cortical set screw using a 3.2-mm drill hole respectively.  This had excellent purchase and maintenance of the desired position.  This was 4 cortices.  Both syndesmotic screws were 4.5 mm fully-threaded cortical set screws.  All hardware used was [employer] stainless steel.  We then obtained final stress x-rays on AP, lateral, and mortise views that showed no gross motion of fixation, proper position, and excellent alignment as well.  Clinically, the range of motion of ankle was excellent.  There was no impingement that could be obviously felt.  The proximal fibula was also indirectly reduced with internal rotation and with reduction of the distal tib-fib syndesmosis and we will treat this conservatively as well with a close treatment. Wounds were copiously irrigated with normal  saline.  Tourniquet was deflated.  Hemostasis was obtained.  There was no pulsatile bleeding. Subcu was closed with 3-0 Vicryl and skin was closed with 4-0 nylon over all wounds.  Sterile dressing was applied.  Modified Jones dressing was applied with the ankle in neutral dorsiflexion.  The patient was stable to PR.     Leonides Grills, M.D.     PB/MEDQ  D:  12/03/2011  T:  12/03/2011  Job:  [id number]

## 2011-12-03 NOTE — H&P (Signed)
  H&P documentation: Placed to be scanned history and physical exam in chart.  -History and Physical Reviewed  -Patient has been re-examined  -No change in the plan of care  Kavari Parrillo A  

## 2011-12-03 NOTE — Anesthesia Postprocedure Evaluation (Signed)
  Anesthesia Post-op Note  Patient: Edward Briggs  Procedure(s) Performed:  OPEN REDUCTION INTERNAL FIXATION (ORIF) ANKLE FRACTURE - left medial malleolus fracture; OPEN REDUCTION INTERNAL FIXATION (ORIF) TIBIA/FIBULA FRACTURE - distal tibial/fibula, syndesmosis closed fracture with manipulation of fibula shaft   Patient Location: PACU  Anesthesia Type: GA combined with regional for post-op pain  Level of Consciousness: awake, alert  and oriented  Airway and Oxygen Therapy: Patient Spontanous Breathing  Post-op Pain: mild  Post-op Assessment: Post-op Vital signs reviewed, Patient's Cardiovascular Status Stable, Respiratory Function Stable, Patent Airway, No signs of Nausea or vomiting and Pain level controlled  Post-op Vital Signs: Reviewed and stable  Complications: No apparent anesthesia complications

## 2011-12-04 ENCOUNTER — Encounter (HOSPITAL_BASED_OUTPATIENT_CLINIC_OR_DEPARTMENT_OTHER): Payer: Self-pay | Admitting: Orthopedic Surgery

## 2012-01-30 ENCOUNTER — Encounter (HOSPITAL_BASED_OUTPATIENT_CLINIC_OR_DEPARTMENT_OTHER): Payer: Self-pay

## 2012-03-24 ENCOUNTER — Encounter (HOSPITAL_BASED_OUTPATIENT_CLINIC_OR_DEPARTMENT_OTHER): Payer: Self-pay | Admitting: *Deleted

## 2012-03-26 ENCOUNTER — Encounter (HOSPITAL_BASED_OUTPATIENT_CLINIC_OR_DEPARTMENT_OTHER)
Admission: RE | Disposition: A | Discharge status: Home / Self Care | Payer: Self-pay | Source: Ambulatory Visit | Attending: Orthopedic Surgery

## 2012-03-26 ENCOUNTER — Encounter (HOSPITAL_BASED_OUTPATIENT_CLINIC_OR_DEPARTMENT_OTHER): Payer: Self-pay | Admitting: Anesthesiology

## 2012-03-26 ENCOUNTER — Ambulatory Visit (HOSPITAL_BASED_OUTPATIENT_CLINIC_OR_DEPARTMENT_OTHER)
Admission: RE | Admit: 2012-03-26 | Discharge: 2012-03-26 | Disposition: A | Discharge status: Home / Self Care | Payer: Worker's Compensation | Source: Ambulatory Visit | Attending: Orthopedic Surgery | Admitting: Orthopedic Surgery

## 2012-03-26 ENCOUNTER — Encounter (HOSPITAL_BASED_OUTPATIENT_CLINIC_OR_DEPARTMENT_OTHER): Payer: Self-pay | Admitting: *Deleted

## 2012-03-26 ENCOUNTER — Ambulatory Visit (HOSPITAL_BASED_OUTPATIENT_CLINIC_OR_DEPARTMENT_OTHER): Payer: Worker's Compensation | Admitting: Anesthesiology

## 2012-03-26 DIAGNOSIS — K219 Gastro-esophageal reflux disease without esophagitis: Secondary | ICD-10-CM | POA: Insufficient documentation

## 2012-03-26 DIAGNOSIS — M25579 Pain in unspecified ankle and joints of unspecified foot: Secondary | ICD-10-CM

## 2012-03-26 DIAGNOSIS — Y831 Surgical operation with implant of artificial internal device as the cause of abnormal reaction of the patient, or of later complication, without mention of misadventure at the time of the procedure: Secondary | ICD-10-CM | POA: Insufficient documentation

## 2012-03-26 DIAGNOSIS — T84498A Other mechanical complication of other internal orthopedic devices, implants and grafts, initial encounter: Secondary | ICD-10-CM | POA: Insufficient documentation

## 2012-03-26 DIAGNOSIS — Z87891 Personal history of nicotine dependence: Secondary | ICD-10-CM | POA: Insufficient documentation

## 2012-03-26 HISTORY — DX: Unspecified osteoarthritis, unspecified site: M19.90

## 2012-03-26 HISTORY — PX: HARDWARE REMOVAL: SHX979

## 2012-03-26 SURGERY — REMOVAL, HARDWARE
Anesthesia: General | Site: Ankle | Laterality: Left | Wound class: Clean

## 2012-03-26 MED ORDER — CHLORHEXIDINE GLUCONATE 4 % EX LIQD: 60.0000 mL | Freq: Once | CUTANEOUS | Status: DC

## 2012-03-26 MED ORDER — LACTATED RINGERS IV SOLN
INTRAVENOUS | Status: DC
  Administered 2012-03-26 (×2): via INTRAVENOUS

## 2012-03-26 MED ORDER — ONDANSETRON HCL 4 MG/2ML IJ SOLN
INTRAMUSCULAR | Status: DC | PRN
  Administered 2012-03-26: 4 mg via INTRAVENOUS

## 2012-03-26 MED ORDER — SODIUM CHLORIDE 0.9 % IV SOLN: INTRAVENOUS | Status: DC

## 2012-03-26 MED ORDER — PROPOFOL 10 MG/ML IV EMUL
INTRAVENOUS | Status: DC | PRN
  Administered 2012-03-26: 150 mg via INTRAVENOUS

## 2012-03-26 MED ORDER — FENTANYL CITRATE 0.05 MG/ML IJ SOLN
INTRAMUSCULAR | Status: DC | PRN
  Administered 2012-03-26: 100 ug via INTRAVENOUS

## 2012-03-26 MED ORDER — LIDOCAINE HCL (CARDIAC) 20 MG/ML IV SOLN
INTRAVENOUS | Status: DC | PRN
  Administered 2012-03-26: 30 mg via INTRAVENOUS

## 2012-03-26 MED ORDER — DEXAMETHASONE SODIUM PHOSPHATE 10 MG/ML IJ SOLN
INTRAMUSCULAR | Status: DC | PRN
  Administered 2012-03-26: 10 mg via INTRAVENOUS

## 2012-03-26 MED ORDER — VITAMIN C 500 MG PO TABS: 500.0000 mg | ORAL_TABLET | Freq: Every day | ORAL | Status: AC

## 2012-03-26 MED ORDER — MIDAZOLAM HCL 5 MG/5ML IJ SOLN
INTRAMUSCULAR | Status: DC | PRN
  Administered 2012-03-26: 2 mg via INTRAVENOUS

## 2012-03-26 MED ORDER — METHOCARBAMOL 500 MG PO TABS: 500.0000 mg | ORAL_TABLET | Freq: Three times a day (TID) | ORAL | Status: AC

## 2012-03-26 MED ORDER — CEFAZOLIN SODIUM 1-5 GM-% IV SOLN
1.0000 g | INTRAVENOUS | Status: AC
  Administered 2012-03-26: 2 g via INTRAVENOUS

## 2012-03-26 MED ORDER — CEFAZOLIN SODIUM 1-5 GM-% IV SOLN: 1.0000 g | INTRAVENOUS | Status: DC

## 2012-03-26 SURGICAL SUPPLY — 48 items
BANDAGE ELASTIC 4 VELCRO ST LF (GAUZE/BANDAGES/DRESSINGS) ×2 IMPLANT
BANDAGE ELASTIC 6 VELCRO ST LF (GAUZE/BANDAGES/DRESSINGS) IMPLANT
BLADE SURG 15 STRL LF DISP TIS (BLADE) ×2 IMPLANT
BLADE SURG 15 STRL SS (BLADE) ×2
BRUSH SCRUB EZ PLAIN DRY (MISCELLANEOUS) ×2 IMPLANT
CLOTH BEACON ORANGE TIMEOUT ST (SAFETY) ×2 IMPLANT
COVER TABLE BACK 60X90 (DRAPES) ×2 IMPLANT
CUFF TOURNIQUET SINGLE 34IN LL (TOURNIQUET CUFF) ×2 IMPLANT
DRAPE EXTREMITY T 121X128X90 (DRAPE) ×2 IMPLANT
DRAPE OEC MINIVIEW 54X84 (DRAPES) ×2 IMPLANT
DRAPE SURG 17X23 STRL (DRAPES) ×2 IMPLANT
DRSG PAD ABDOMINAL 8X10 ST (GAUZE/BANDAGES/DRESSINGS) ×2 IMPLANT
ELECT REM PT RETURN 9FT ADLT (ELECTROSURGICAL) ×2
ELECTRODE REM PT RTRN 9FT ADLT (ELECTROSURGICAL) ×1 IMPLANT
GAUZE XEROFORM 1X8 LF (GAUZE/BANDAGES/DRESSINGS) ×2 IMPLANT
GLOVE BIO SURGEON STRL SZ 6.5 (GLOVE) ×2 IMPLANT
GLOVE BIO SURGEON STRL SZ8 (GLOVE) ×2 IMPLANT
GLOVE BIOGEL PI IND STRL 7.0 (GLOVE) ×2 IMPLANT
GLOVE BIOGEL PI IND STRL 8 (GLOVE) ×2 IMPLANT
GLOVE BIOGEL PI INDICATOR 7.0 (GLOVE) ×2
GLOVE BIOGEL PI INDICATOR 8 (GLOVE) ×2
GLOVE SURG SS PI 8.0 STRL IVOR (GLOVE) ×2 IMPLANT
GOWN BRE IMP PREV XXLGXLNG (GOWN DISPOSABLE) ×2 IMPLANT
GOWN PREVENTION PLUS XLARGE (GOWN DISPOSABLE) IMPLANT
NEEDLE HYPO 22GX1.5 SAFETY (NEEDLE) IMPLANT
NS IRRIG 1000ML POUR BTL (IV SOLUTION) ×2 IMPLANT
PACK BASIN DAY SURGERY FS (CUSTOM PROCEDURE TRAY) ×2 IMPLANT
PAD CAST 4YDX4 CTTN HI CHSV (CAST SUPPLIES) IMPLANT
PADDING CAST ABS 4INX4YD NS (CAST SUPPLIES) ×1
PADDING CAST ABS COTTON 4X4 ST (CAST SUPPLIES) ×1 IMPLANT
PADDING CAST COTTON 4X4 STRL (CAST SUPPLIES)
PENCIL BUTTON HOLSTER BLD 10FT (ELECTRODE) IMPLANT
SHEET MEDIUM DRAPE 40X70 STRL (DRAPES) ×4 IMPLANT
SPONGE GAUZE 4X4 12PLY (GAUZE/BANDAGES/DRESSINGS) ×2 IMPLANT
STOCKINETTE 6  STRL (DRAPES) ×1
STOCKINETTE 6 STRL (DRAPES) ×1 IMPLANT
SUCTION FRAZIER TIP 10 FR DISP (SUCTIONS) IMPLANT
SUT ETHILON 4 0 PS 2 18 (SUTURE) ×2 IMPLANT
SUT VIC AB 3-0 FS2 27 (SUTURE) IMPLANT
SUT VIC AB 3-0 PS1 18 (SUTURE)
SUT VIC AB 3-0 PS1 18XBRD (SUTURE) IMPLANT
SYR BULB 3OZ (MISCELLANEOUS) ×2 IMPLANT
SYR CONTROL 10ML LL (SYRINGE) IMPLANT
TOWEL OR 17X24 6PK STRL BLUE (TOWEL DISPOSABLE) ×6 IMPLANT
TOWEL OR NON WOVEN STRL DISP B (DISPOSABLE) ×2 IMPLANT
TUBE CONNECTING 20X1/4 (TUBING) ×4 IMPLANT
UNDERPAD 30X30 INCONTINENT (UNDERPADS AND DIAPERS) ×2 IMPLANT
WATER STERILE IRR 1000ML POUR (IV SOLUTION) ×2 IMPLANT

## 2012-03-26 NOTE — Discharge Instructions (Signed)

## 2012-03-26 NOTE — Anesthesia Procedure Notes (Signed)
Procedure Name: LMA Insertion Date/Time: 03/26/2012 11:59 AM Performed by: Burna Cash Pre-anesthesia Checklist: Patient identified, Emergency Drugs available, Suction available and Patient being monitored Patient Re-evaluated:Patient Re-evaluated prior to inductionOxygen Delivery Method: Circle System Utilized Preoxygenation: Pre-oxygenation with 100% oxygen Intubation Type: IV induction Ventilation: Mask ventilation without difficulty LMA: LMA inserted LMA Size: 5.0 Number of attempts: 1 Airway Equipment and Method: bite block Placement Confirmation: positive ETCO2 Tube secured with: Tape Dental Injury: Teeth and Oropharynx as per pre-operative assessment

## 2012-03-26 NOTE — Anesthesia Preprocedure Evaluation (Addendum)
Anesthesia Evaluation  Patient identified by MRN, date of birth, ID band Patient awake    Reviewed: Allergy & Precautions, H&P , NPO status , Patient's Chart, lab work & pertinent test results  Airway Mallampati: I TM Distance: >3 FB Neck ROM: Full    Dental  (+) Partial Lower, Partial Upper and Dental Advisory Given   Pulmonary former smoker (quit over 20 years) breath sounds clear to auscultation  Pulmonary exam normal       Cardiovascular negative cardio ROS  Rhythm:Regular Rate:Normal  '03 cath: normal coronaries, normal ventricle '03 ECHO: normal LVF, normal valves    Neuro/Psych negative neurological ROS     GI/Hepatic Neg liver ROS, GERD-  Controlled,  Endo/Other  Morbid obesity  Renal/GU negative Renal ROS     Musculoskeletal   Abdominal (+) + obese,   Peds  Hematology negative hematology ROS (+)   Anesthesia Other Findings   Reproductive/Obstetrics                          Anesthesia Physical Anesthesia Plan  ASA: II  Anesthesia Plan: General   Post-op Pain Management:    Induction: Intravenous  Airway Management Planned: Oral ETT  Additional Equipment:   Intra-op Plan:   Post-operative Plan:   Informed Consent: I have reviewed the patients History and Physical, chart, labs and discussed the procedure including the risks, benefits and alternatives for the proposed anesthesia with the patient or authorized representative who has indicated his/her understanding and acceptance.   Dental advisory given  Plan Discussed with: Surgeon and CRNA  Anesthesia Plan Comments: (Plan routine monitors, GA- LMA OK  )        Anesthesia Quick Evaluation

## 2012-03-26 NOTE — Transfer of Care (Signed)
Immediate Anesthesia Transfer of Care Note  Patient: Edward Briggs  Procedure(s) Performed: Procedure(s) (LRB): HARDWARE REMOVAL (Left)  Patient Location: PACU  Anesthesia Type: General  Level of Consciousness: sedated  Airway & Oxygen Therapy: Patient Spontanous Breathing and Patient connected to face mask oxygen  Post-op Assessment: Report given to PACU RN and Post -op Vital signs reviewed and stable  Post vital signs: Reviewed and stable  Complications: No apparent anesthesia complications

## 2012-03-26 NOTE — H&P (Signed)
  H&P documentation: Placed to be scanned history and physical exam in chart.  -History and Physical Reviewed  -Patient has been re-examined  -No change in the plan of care  Edward Briggs  

## 2012-03-26 NOTE — Brief Op Note (Signed)
03/26/2012  12:23 PM  PATIENT:  Edward Briggs  61 y.o. male  PRE-OPERATIVE DIAGNOSIS:  retained hardware and status post orif syndesmotic ligament and medial wall  POST-OPERATIVE DIAGNOSIS:  same as preop  PROCEDURE:  Procedure(s) (LRB): HARDWARE REMOVAL (Left)  SURGEON:  Surgeon(s) and Role:    * Sherri Rad, MD - Primary  PHYSICIAN ASSISTANT: Rexene Edison, PAC   ASSISTANTS: Above   ANESTHESIA:   general  EBL:  Total I/O In: 300 [I.V.:300] Out: -   BLOOD ADMINISTERED:none  DRAINS: none   LOCAL MEDICATIONS USED:  NONE  SPECIMEN:  No Specimen  DISPOSITION OF SPECIMEN:  N/A  COUNTS:  YES  TOURNIQUET:  * No tourniquets in log *  DICTATION: .Other Dictation: Dictation Number [id number]  PLAN OF CARE: Discharge to home after PACU  PATIENT DISPOSITION:  PACU - hemodynamically stable.   Delay start of Pharmacological VTE agent (>24hrs) due to surgical blood loss or risk of bleeding: no

## 2012-03-26 NOTE — Anesthesia Postprocedure Evaluation (Signed)
  Anesthesia Post-op Note  Patient: Edward Briggs  Procedure(s) Performed: Procedure(s) (LRB): HARDWARE REMOVAL (Left)  Patient Location: PACU  Anesthesia Type: General  Level of Consciousness: awake, alert  and oriented  Airway and Oxygen Therapy: Patient Spontanous Breathing  Post-op Pain: none  Post-op Assessment: Post-op Vital signs reviewed, Patient's Cardiovascular Status Stable, Respiratory Function Stable, Patent Airway, No signs of Nausea or vomiting and Pain level controlled  Post-op Vital Signs: Reviewed and stable  Complications: No apparent anesthesia complications

## 2012-03-30 ENCOUNTER — Encounter (HOSPITAL_BASED_OUTPATIENT_CLINIC_OR_DEPARTMENT_OTHER): Payer: Self-pay | Admitting: Orthopedic Surgery

## 2012-03-30 NOTE — Op Note (Signed)
Edward Briggs, Edward Briggs NO.:  [id number]  MEDICAL RECORD NO.:  [id number]  LOCATION:                                 FACILITY:  PHYSICIAN:  Leonides Grills, M.D.     DATE OF BIRTH:  [birth year]  DATE OF PROCEDURE:  03/26/2012 DATE OF DISCHARGE:                              OPERATIVE REPORT   PREOPERATIVE DIAGNOSIS:  Complication of hardware of left ankle, status post open reduction and internal fixation of the distal tib-fib syndesmosis and medial malleolus.  POSTOPERATIVE DIAGNOSIS:  Complication of hardware of left ankle, status post open reduction and internal fixation of the distal tib-fib syndesmosis and medial malleolus.  OPERATION: 1. Hardware removal deep left ankle syndesmotic screws only. 2. Stress x-rays of left ankle.  ANESTHESIA:  General.  SURGEON:  SURGEON:  Leonides Grills, MD.  ASSISTANT:  None.  ESTIMATED BLOOD LOSS:  Minimal.  TOURNIQUET TIME:  None.  COMPLICATIONS:  None.  DISPOSITION:  Stable to PR.  INDICATION:  This is a 61 year old male who underwent open reduction and fixation for the above injuries.  At this point, he was consented for hardware removal of the syndesmotic screws only.  He was consented for the above procedure.  All risks, infection, nerve or vessel injury, possibility of nonunion of the syndesmotic ligament with instability of ankle that may require refixation, persistent pain, worse pain, prolonged recovery, stiffness, arthritis, wound healing problems, DVT, PE, and possibility screws may be broken were all explained.  Questions were encouraged and answered.  OPERATION:  The patient was brought to the operating room and placed in a supine position.  After adequate general anesthesia was administered as well as Ancef 1 g IV piggyback, bumps placed on left ipsilateral hip internally rotating left lower extremity.  All bony prominences were well padded.  Left lower extremity was prepped and draped  sterile manner.  No tourniquet was used.  A longitudinal incision down to bone was then made through the old incision to the screw head.  Once the screw head was developed, screwdrivers were placed in the head and this was then removed.  Both screws were removed without difficulty.  Both were intact, no evidence of hardware failure.  We then obtained external rotational stress x-ray and showed no lateral subluxation of the talus within the ankle mortise.  Diastasis of the distal tib-fib syndesmosis.  The wound was copiously irrigated with normal saline.  Skin was closed with 4-0 nylon stitch.  Sterile dressing was applied.  The patient was stable to PR.     Leonides Grills, M.D.     PB/MEDQ  D:  03/26/2012  T:  03/26/2012  Job:  [id number]

## 2012-08-25 ENCOUNTER — Ambulatory Visit (INDEPENDENT_AMBULATORY_CARE_PROVIDER_SITE_OTHER): Payer: BC Managed Care – PPO | Admitting: Family Medicine

## 2012-08-25 VITALS — BP 133/87 | HR 80 | Temp 98.1°F | Resp 16 | Ht 73.0 in | Wt 263.0 lb

## 2012-08-25 DIAGNOSIS — R079 Chest pain, unspecified: Secondary | ICD-10-CM

## 2012-08-25 DIAGNOSIS — E119 Type 2 diabetes mellitus without complications: Secondary | ICD-10-CM

## 2012-08-25 DIAGNOSIS — R42 Dizziness and giddiness: Secondary | ICD-10-CM

## 2012-08-25 DIAGNOSIS — D649 Anemia, unspecified: Secondary | ICD-10-CM

## 2012-08-25 LAB — POCT CBC
Granulocyte percent: 62.9 %G (ref 37–80)
HCT, POC: 43 % — AB (ref 43.5–53.7)
Hemoglobin: 13.2 g/dL — AB (ref 14.1–18.1)
MPV: 8.4 fL (ref 0–99.8)
POC Granulocyte: 6.4 (ref 2–6.9)
POC MID %: 4.9 %M (ref 0–12)
RBC: 4.42 M/uL — AB (ref 4.69–6.13)

## 2012-08-25 LAB — POCT GLYCOSYLATED HEMOGLOBIN (HGB A1C): Hemoglobin A1C: 10.2

## 2012-08-25 LAB — GLUCOSE, POCT (MANUAL RESULT ENTRY): POC Glucose: 226 mg/dl — AB (ref 70–99)

## 2012-08-25 MED ORDER — METFORMIN HCL 500 MG PO TABS: 500.0000 mg | ORAL_TABLET | Freq: Two times a day (BID) | ORAL | Status: DC

## 2012-08-25 NOTE — Progress Notes (Signed)
[facility] [address], [place] [address] [phone]  Date:  08/25/2012   Name:  Edward Briggs   DOB:  10-13-1951   MRN:  [id number]  PCP:  Sheila Oats, MD    Chief Complaint: Sore Throat and Dizziness   History of Present Illness:  Edward Briggs is a 61 y.o. very pleasant male patient who presents with the following:  He was at work today and noted onset of vertigo at around 10 am.  The dizziness lasted for about 10 minutes.   It has not come back again.  He has never had vertigo in the past.   He also had a ST a few days ago- his throat now feels ok.    He has not noted a cough, fever, or headache.   He did note some chest pain about 2 weeks ago.  It lasted a minute or so.  It has not come back.  He was walking on level ground when this occurred.    No numbness, weakness, slurred speech or facial drooping. He did not have any CP when he had the dizziness today.    He is generally healthy, no history of HTN or heart trouble.  He did have CP and a cardiac cath in 07/2002 and was finally diagnosed with GERD.  Normal cath and echo at that time.   His sister has a heart murmur- no other family history.    Patient Active Problem List  Diagnosis  . GASTROESOPHAGEAL REFLUX DISEASE  . CHEST PAIN    Past Medical History  Diagnosis Date  . GERD (gastroesophageal reflux disease)     no meds  . Arthritis     Past Surgical History  Procedure Date  . Skin graft ~1970    Lt great toe (graft from Lt thigh)  . Abscess drainage   . Pilonidal cyst excision ~1982  . Orif ankle fracture 12/03/2011    Procedure: OPEN REDUCTION INTERNAL FIXATION (ORIF) ANKLE FRACTURE;  Surgeon: Sherri Rad, MD;  Location: [facility];  Service: Orthopedics;  Laterality: Left;  left medial malleolus fracture  . Hardware removal 03/26/2012    Procedure: HARDWARE REMOVAL;  Surgeon: Sherri Rad, MD;  Location: [facility];  Service:  Orthopedics;  Laterality: Left;  hardware removal deep left ankle syndesmotic screws only and stress xrays ankle     History  Substance Use Topics  . Smoking status: Former Smoker    Quit date: 10/30/1988  . Smokeless tobacco: Not on file  . Alcohol Use: 0.6 oz/week    1 Shots of liquor per week     every two weeks    Family History  Problem Relation Age of Onset  . Cancer Mother   . Cancer Father     No Known Allergies  Medication list has been reviewed and updated.  Current Outpatient Prescriptions on File Prior to Visit  Medication Sig Dispense Refill  . oxyCODONE-acetaminophen (PERCOCET) 10-650 MG per tablet Take 0.5 tablets by mouth every 6 (six) hours as needed.      . vitamin C (ASCORBIC ACID) 500 MG tablet Take 1 tablet (500 mg total) by mouth daily.  90 tablet  0  . vitamin C (ASCORBIC ACID) 500 MG tablet Take 1 tablet (500 mg total) by mouth daily.  90 tablet  0    Review of Systems:  As per HPI- otherwise negative.   Physical Examination: Filed Vitals:   08/25/12 1119  BP: 133/87  Pulse: 80  Temp: 98.1 F (36.7 C)  Resp: 16   Filed Vitals:   08/25/12 1119  Height: 6\' 1"  (1.854 m)  Weight: 263 lb (119.296 kg)   Body mass index is 34.70 kg/(m^2). Ideal Body Weight: Weight in (lb) to have BMI = 25: 189.1   GEN: WDWN, NAD, Non-toxic, A & O x 3, obese, looks well, here with his wife today HEENT: Atraumatic, Normocephalic. Neck supple. No masses, No LAD.  Bilateral TM wnl, oropharynx normal.  PEERL,EOMI.   Ears and Nose: No external deformity. CV: RRR, No M/G/R. No JVD. No thrill. No extra heart sounds. PULM: CTA B, no wheezes, crackles, rhonchi. No retractions. No resp. distress. No accessory muscle use. ABD: S, NT, ND, +BS. No rebound. No HSM. EXTR: No c/c/e NEURO Normal gait. Negative arm drift, normal strength, sensation, and DTR all extremities.  No facial droop or muscular asymmetry. Normal cerebellar fundgion PSYCH: Normally interactive.  Conversant. Not depressed or anxious appearing.  Calm demeanor.   Results for orders placed in visit on 08/25/12  POCT CBC      Component Value Range   WBC 10.1  4.6 - 10.2 K/uL   Lymph, poc 3.3  0.6 - 3.4   POC LYMPH PERCENT 32.3  10 - 50 %L   MID (cbc) 0.5  0 - 0.9   POC MID % 4.9  0 - 12 %M   POC Granulocyte 6.4  2 - 6.9   Granulocyte percent 62.9  37 - 80 %G   RBC 4.42 (*) 4.69 - 6.13 M/uL   Hemoglobin 13.2 (*) 14.1 - 18.1 g/dL   HCT, POC 16.1 (*) 09.6 - 53.7 %   MCV 97.3 (*) 80 - 97 fL   MCH, POC 29.9  27 - 31.2 pg   MCHC 30.7 (*) 31.8 - 35.4 g/dL   RDW, POC 04.5     Platelet Count, POC 284  142 - 424 K/uL   MPV 8.4  0 - 99.8 fL  GLUCOSE, POCT (MANUAL RESULT ENTRY)      Component Value Range   POC Glucose 226 (*) 70 - 99 mg/dl  POCT GLYCOSYLATED HEMOGLOBIN (HGB A1C)      Component Value Range   Hemoglobin A1C 10.2     EKG: NSR, no ST elevation or depression  Assessment and Plan: 1. Vertigo  POCT CBC, POCT glucose (manual entry)  2. Chest pain  EKG 12-Lead  3. Diabetes mellitus, type II  POCT glycosylated hemoglobin (Hb A1C), metFORMIN (GLUCOPHAGE) 500 MG tablet  4. Anemia     New diagnosis of DM today.  Discussed in detail with [place] and his wife.  This is likely the cause of his dizziness today.  We will start treatment with metformin.  Went over some diet and exercise guidelines.  He will review the ADA website.  Plan close follow-up and a FLP at follow-up.  Advised him that we will probably start lisinopril at his follow-up. Also will need to start aspirin and have a flu shot.  Will make these changes step- wise as do not want to overwhelm him with changes today.   He also has mild anemia today.  We will recheck his CBC at his recheck.  He had a colonoscopy in 2006 at [facility] CP- atypical and EKG today is reassuring.  However, will need to evaluate further if it happens again.  Right now focus on risk factor reduction.    Also added a BMP today to  his  labs  Abbe Amsterdam, MD

## 2012-08-25 NOTE — Patient Instructions (Addendum)
You have diabetes.  We are going to work together to get this under control.  Please look over the american diabetes association (ADA) website- it has good diet and exercise advice.  Let's see you back in 2 weeks- please come fasting so we can check your cholesterol

## 2012-08-26 LAB — BASIC METABOLIC PANEL
BUN: 10 mg/dL (ref 6–23)
Chloride: 103 mEq/L (ref 96–112)
Potassium: 4.2 mEq/L (ref 3.5–5.3)

## 2012-08-27 ENCOUNTER — Encounter: Payer: Self-pay | Admitting: Family Medicine

## 2012-09-10 ENCOUNTER — Ambulatory Visit (INDEPENDENT_AMBULATORY_CARE_PROVIDER_SITE_OTHER): Payer: BC Managed Care – PPO | Admitting: Family Medicine

## 2012-09-10 VITALS — BP 118/72 | HR 77 | Temp 98.2°F | Resp 18 | Ht 72.5 in | Wt 262.2 lb

## 2012-09-10 DIAGNOSIS — IMO0001 Reserved for inherently not codable concepts without codable children: Secondary | ICD-10-CM

## 2012-09-10 DIAGNOSIS — E119 Type 2 diabetes mellitus without complications: Secondary | ICD-10-CM | POA: Insufficient documentation

## 2012-09-10 DIAGNOSIS — E669 Obesity, unspecified: Secondary | ICD-10-CM

## 2012-09-10 LAB — COMPREHENSIVE METABOLIC PANEL
ALT: 16 U/L (ref 0–53)
Albumin: 4.1 g/dL (ref 3.5–5.2)
Alkaline Phosphatase: 73 U/L (ref 39–117)
CO2: 30 mEq/L (ref 19–32)
Glucose, Bld: 151 mg/dL — ABNORMAL HIGH (ref 70–99)
Potassium: 4.4 mEq/L (ref 3.5–5.3)
Sodium: 138 mEq/L (ref 135–145)
Total Bilirubin: 0.5 mg/dL (ref 0.3–1.2)
Total Protein: 7.2 g/dL (ref 6.0–8.3)

## 2012-09-10 LAB — LIPID PANEL
Cholesterol: 115 mg/dL (ref 0–200)
LDL Cholesterol: 59 mg/dL (ref 0–99)
Triglycerides: 87 mg/dL (ref <150)
VLDL: 17 mg/dL (ref 0–40)

## 2012-09-10 LAB — POCT URINALYSIS DIPSTICK
Bilirubin, UA: NEGATIVE
Glucose, UA: 500
Nitrite, UA: NEGATIVE
Urobilinogen, UA: 0.2

## 2012-09-10 LAB — GLUCOSE, POCT (MANUAL RESULT ENTRY): POC Glucose: 176 mg/dl — AB (ref 70–99)

## 2012-09-10 MED ORDER — BLOOD GLUCOSE METER KIT: PACK | Status: AC

## 2012-09-10 MED ORDER — METFORMIN HCL 500 MG PO TABS: ORAL_TABLET | ORAL | Status: DC

## 2012-09-10 MED ORDER — LISINOPRIL 5 MG PO TABS: 5.0000 mg | ORAL_TABLET | Freq: Every day | ORAL | Status: DC

## 2012-09-10 NOTE — Patient Instructions (Addendum)
Go to American Diabetic Association website.  Aspirin 81 mg daily Lisinopril 5 mg daily Metformin 1000 mg twice daily

## 2012-09-10 NOTE — Progress Notes (Signed)
  Subjective:  patient is here for followup with regard to his diabetes. He feels all better. No major problems or concerns. He had a lot of questions about diet. His wife accompanied her here today. He does not have a glucose meter.  Objective: Obese male in no acute distress. Chest is clear. Heart regular without murmurs. Normal neurologic sensation in the feet   Results for orders placed in visit on 09/10/12  GLUCOSE, POCT (MANUAL RESULT ENTRY)      Component Value Range   POC Glucose 176 (*) 70 - 99 mg/dl  POCT URINALYSIS DIPSTICK      Component Value Range   Color, UA yellow     Clarity, UA clear     Glucose, UA 500     Bilirubin, UA neg     Ketones, UA neg     Spec Grav, UA 1.015     Blood, UA neg     pH, UA 5.5     Protein, UA neg     Urobilinogen, UA 0.2     Nitrite, UA neg     Leukocytes, UA Negative    Assessment: Poorly controlled diabetes mellitus, improving Obesity  Plan: Increase the metformin to 1000 mg twice a day. Add lisinopril 5 mg daily. Add aspirin 81 mg daily. Check baseline labs. See her back in 2 months. Call if problems. Thank you

## 2012-09-13 ENCOUNTER — Encounter: Payer: Self-pay | Admitting: Family Medicine

## 2012-11-10 ENCOUNTER — Ambulatory Visit (INDEPENDENT_AMBULATORY_CARE_PROVIDER_SITE_OTHER): Payer: BC Managed Care – PPO | Admitting: Family Medicine

## 2012-11-10 VITALS — BP 128/84 | HR 82 | Temp 97.9°F | Resp 18 | Ht 72.5 in | Wt 263.0 lb

## 2012-11-10 DIAGNOSIS — E119 Type 2 diabetes mellitus without complications: Secondary | ICD-10-CM

## 2012-11-10 DIAGNOSIS — IMO0001 Reserved for inherently not codable concepts without codable children: Secondary | ICD-10-CM

## 2012-11-10 LAB — COMPREHENSIVE METABOLIC PANEL
ALT: 14 U/L (ref 0–53)
Albumin: 4.3 g/dL (ref 3.5–5.2)
Alkaline Phosphatase: 65 U/L (ref 39–117)
Glucose, Bld: 99 mg/dL (ref 70–99)
Potassium: 4.4 mEq/L (ref 3.5–5.3)
Sodium: 140 mEq/L (ref 135–145)
Total Bilirubin: 0.7 mg/dL (ref 0.3–1.2)
Total Protein: 7.3 g/dL (ref 6.0–8.3)

## 2012-11-10 LAB — POCT GLYCOSYLATED HEMOGLOBIN (HGB A1C): Hemoglobin A1C: 6.8

## 2012-11-10 MED ORDER — METFORMIN HCL 500 MG PO TABS: ORAL_TABLET | ORAL | Status: DC

## 2012-11-10 NOTE — Patient Instructions (Signed)
Same medications. Return in 3-4 months. Work hard on weight loss.

## 2012-11-10 NOTE — Progress Notes (Signed)
Subjective: Patient is here for recheck with regard to his diabetes. In November he had a high hemoglobin A1c, we increased his Glucophage to metformin 1000 mg twice a day ( he is only takin 500 bid apparently). He takes his medicines faithfully. He checks his sugars he gets usually 100 to 100 in the mornings. No major complaints. No cardiovascular respiratory, sensory, or neurologic complaints.  Objective: Alert and oriented. Chest clear. Heart regular without murmurs. Feet look good. No ulcerations. Pulses are present little diminished, believe I can feel both posterior tib pulses. He has sensory to the filament test was normal.  Assessment: Diabetes  Plan: Check hemoglobin A1c and chemistries  Results for orders placed in visit on 11/10/12  POCT GLYCOSYLATED HEMOGLOBIN (HGB A1C)      Component Value Range   Hemoglobin A1C 6.8     Commended him on the wonderful improvement in his sugars. Continue metformin 500 twice a day.

## 2013-01-05 ENCOUNTER — Ambulatory Visit (INDEPENDENT_AMBULATORY_CARE_PROVIDER_SITE_OTHER): Payer: BC Managed Care – PPO | Admitting: Emergency Medicine

## 2013-01-05 VITALS — BP 160/80 | HR 86 | Temp 98.3°F | Resp 17 | Ht 72.0 in | Wt 264.0 lb

## 2013-01-05 DIAGNOSIS — M7918 Myalgia, other site: Secondary | ICD-10-CM

## 2013-01-05 DIAGNOSIS — L03317 Cellulitis of buttock: Secondary | ICD-10-CM

## 2013-01-05 DIAGNOSIS — IMO0001 Reserved for inherently not codable concepts without codable children: Secondary | ICD-10-CM

## 2013-01-05 DIAGNOSIS — L0231 Cutaneous abscess of buttock: Secondary | ICD-10-CM

## 2013-01-05 MED ORDER — HYDROCODONE-ACETAMINOPHEN 5-325 MG PO TABS: 1.0000 | ORAL_TABLET | Freq: Four times a day (QID) | ORAL | Status: DC | PRN

## 2013-01-05 MED ORDER — DOXYCYCLINE HYCLATE 100 MG PO CAPS: 100.0000 mg | ORAL_CAPSULE | Freq: Two times a day (BID) | ORAL | Status: DC

## 2013-01-05 NOTE — Progress Notes (Signed)
Procedure Note: Verbal consent obtained.  Local anesthesia with 3 cc 2% lidocaine.  Betadine prep.  1cm incision with 11 blade.  Copious purulence expressed.  Wound culture collected.  Wound explored, no loculations.  Wound irrigated with remaining anesthetic.  Packed with 1/4 inch plain packing.  Cleansed and dressed.  Pt tolerated well.

## 2013-01-05 NOTE — Progress Notes (Signed)
  Subjective:    Patient ID: Edward Briggs, male    DOB: Sep 01, 1951, 62 y.o.   MRN: [id number]  HPI boil on buttocks, there for about 4 days. Drained in the past on two separate occassions. One in the 80's and again about 5 years ago the area is exquisitely tender. The last time he had to have an I&D was about 5 years ago.     Review of Systems     Objective:   Physical Exam examination reveals a 6 x 8 cm firm area involving the right mid buttocks. There is loosening of the skin in about a 2 x 3 cm area consistent with abscess formation.        Assessment & Plan:  Patient has an abscess of his right buttocks with a history of same. We'll go ahead and I&D the abscess place him on hydrocodone and doxycycline recheck 48 hours for packing removal wound culture will be sent .

## 2013-01-08 LAB — WOUND CULTURE: Organism ID, Bacteria: NO GROWTH

## 2013-01-10 ENCOUNTER — Ambulatory Visit (INDEPENDENT_AMBULATORY_CARE_PROVIDER_SITE_OTHER): Payer: BC Managed Care – PPO | Admitting: Physician Assistant

## 2013-01-10 VITALS — BP 124/84 | HR 87 | Temp 98.0°F | Resp 16 | Ht 72.5 in | Wt 270.0 lb

## 2013-01-10 DIAGNOSIS — L723 Sebaceous cyst: Secondary | ICD-10-CM

## 2013-01-10 DIAGNOSIS — L089 Local infection of the skin and subcutaneous tissue, unspecified: Secondary | ICD-10-CM

## 2013-01-10 NOTE — Progress Notes (Signed)
   [address] [place] [address]   Phone [phone]  Subjective:    Patient ID: Edward Briggs, male    DOB: [birth year], 62 y.o.   MRN: [id number]  HPI  Pt presents to clinic for wound recheck.  He was supposed to come back 3 days ago but due to the weather he is back today.  He has been changing the drsg daily.  He has noticed a lot of drainage from the wound.  He is tolerating the abx ok.  Less pain.  Review of Systems  Constitutional: Negative for fever and chills.  Gastrointestinal: Negative for nausea.  Skin: Positive for wound.       Objective:   Physical Exam  Vitals reviewed. Constitutional: He is oriented to person, place, and time. He appears well-developed and well-nourished.  HENT:  Head: Normocephalic and atraumatic.  Right Ear: External ear normal.  Left Ear: External ear normal.  Neurological: He is alert and oriented to person, place, and time.  Skin: Skin is warm and dry.  Packing removed.  Some sebaceous material expressed.  Irrigated with 1% lido and then repacked with 1/4in plain packing - Drsg placed on wound.  Psychiatric: He has a normal mood and affect. His behavior is normal. Judgment and thought content normal.   Reviewed wound cultures with patient.     Assessment & Plan:  Infected sebaceous cyst  Pt to recheck in 4 days.  Continue abx and daily drsg changes.

## 2013-01-13 ENCOUNTER — Ambulatory Visit (INDEPENDENT_AMBULATORY_CARE_PROVIDER_SITE_OTHER): Payer: BC Managed Care – PPO | Admitting: Physician Assistant

## 2013-01-13 VITALS — BP 130/84 | HR 72 | Temp 98.2°F | Resp 16 | Ht 73.0 in | Wt 266.0 lb

## 2013-01-13 DIAGNOSIS — L723 Sebaceous cyst: Secondary | ICD-10-CM

## 2013-01-13 DIAGNOSIS — L089 Local infection of the skin and subcutaneous tissue, unspecified: Secondary | ICD-10-CM

## 2013-01-13 NOTE — Progress Notes (Signed)
  Subjective:    Patient ID: Edward Briggs, male    DOB: Jul 05, 1951, 62 y.o.   MRN: [id number]  HPI   Edward Briggs is a very pleasant 62 yr old male here for follow-up on an abscess/infected sebaceous cyst I&D'd here 01/05/13.  Pt states he is doing much better.  Minimal pain.  Changing dressing about once daily, still with drainage from the wound.  Tolerating abx well.      Review of Systems  Skin: Positive for wound.  All other systems reviewed and are negative.       Objective:   Physical Exam  Vitals reviewed. Constitutional: He is oriented to person, place, and time. He appears well-developed and well-nourished. No distress.  HENT:  Head: Normocephalic and atraumatic.  Eyes: Conjunctivae are normal. No scleral icterus.  Pulmonary/Chest: Effort normal.  Neurological: He is alert and oriented to person, place, and time.  Skin: Skin is warm and dry.     Psychiatric: He has a normal mood and affect. His behavior is normal.     Filed Vitals:   01/13/13 0813  BP: 130/84  Pulse: 72  Temp: 98.2 F (36.8 C)  Resp: 16    Wound Care: Dressing removed.  Packing stuck to dressing, saturated with purulent/serosanguinous drainage.  Unable to express anything from the wound.  Irrigated with 5cc 2% plain lidocaine.  Wound explored, still approx 1cm deep.  Repacked with 1/4" plain packing.  Dressing applied.       Assessment & Plan:  Infected sebaceous cyst  Edward Briggs is a very pleasant 62 yr old male here for wound care.  Wound appears to be healing well, it looks much better than when I initially drained the abscess 8 days ago.  The wound still accomodates several centimeters of packing, so I have repacked it.  Discussed wound care.  Finish abx as prescribed.  Will see him back in approx 3 days for recheck.  Fast track card given.

## 2013-01-17 ENCOUNTER — Ambulatory Visit (INDEPENDENT_AMBULATORY_CARE_PROVIDER_SITE_OTHER): Payer: BC Managed Care – PPO | Admitting: Physician Assistant

## 2013-01-17 VITALS — BP 133/86 | HR 90 | Temp 98.5°F | Resp 18 | Wt 282.0 lb

## 2013-01-17 DIAGNOSIS — L089 Local infection of the skin and subcutaneous tissue, unspecified: Secondary | ICD-10-CM

## 2013-01-17 DIAGNOSIS — L723 Sebaceous cyst: Secondary | ICD-10-CM

## 2013-01-17 NOTE — Progress Notes (Signed)
  Subjective:    Patient ID: Edward Briggs, male    DOB: [birth year], 62 y.o.   MRN: [id number]  HPI    Edward Briggs is a very pleasant 62 yr old male here for follow-up on an abscess/infected sebaceous cyst I&D'd here 01/05/13.  Pt states he is doing much better.  No pain.  Went back to work today.  Changing dressing about once daily, still with some drainage from the wound.  Completed antibiotics.    Review of Systems  Skin: Positive for wound.  All other systems reviewed and are negative.       Objective:   Physical Exam  Vitals reviewed. Constitutional: He is oriented to person, place, and time. He appears well-developed and well-nourished. No distress.  HENT:  Head: Normocephalic and atraumatic.  Eyes: Conjunctivae are normal. No scleral icterus.  Pulmonary/Chest: Effort normal.  Neurological: He is alert and oriented to person, place, and time.  Skin: Skin is warm and dry.     Psychiatric: He has a normal mood and affect. His behavior is normal.     Filed Vitals:   01/17/13 1705  BP: 133/86  Pulse: 90  Temp: 98.5 F (36.9 C)  Resp: 18    Wound Care: Dressing removed.  Packing removed.  Unable to express anything from the wound.  Wound bed healthy.  Dry dressing applied.       Assessment & Plan:  Infected sebaceous cyst  Edward Briggs is a very pleasant 62 yr old male here for wound care.  Wound appears to be healing well.  Wound bed visible and healthy.  I have not repacked.  Dry dressing applied.  Encouraged pt to continue daily dressings until the area is fully closed.  No need for further follow-up unless he is worsening or not improving.  Pt needed updated work note stating he can return to work today, I provided this for him.

## 2013-09-17 ENCOUNTER — Other Ambulatory Visit: Payer: Self-pay | Admitting: Family Medicine

## 2013-10-23 ENCOUNTER — Other Ambulatory Visit: Payer: Self-pay | Admitting: Physician Assistant

## 2013-11-11 ENCOUNTER — Ambulatory Visit (INDEPENDENT_AMBULATORY_CARE_PROVIDER_SITE_OTHER): Payer: BC Managed Care – PPO | Admitting: Family Medicine

## 2013-11-11 VITALS — BP 110/82 | HR 81 | Temp 98.3°F | Resp 16 | Ht 71.5 in | Wt 259.0 lb

## 2013-11-11 DIAGNOSIS — L259 Unspecified contact dermatitis, unspecified cause: Secondary | ICD-10-CM

## 2013-11-11 DIAGNOSIS — E119 Type 2 diabetes mellitus without complications: Secondary | ICD-10-CM

## 2013-11-11 DIAGNOSIS — L309 Dermatitis, unspecified: Secondary | ICD-10-CM

## 2013-11-11 LAB — COMPREHENSIVE METABOLIC PANEL
ALK PHOS: 53 U/L (ref 39–117)
ALT: 14 U/L (ref 0–53)
AST: 18 U/L (ref 0–37)
Albumin: 4.5 g/dL (ref 3.5–5.2)
BUN: 13 mg/dL (ref 6–23)
CO2: 26 mEq/L (ref 19–32)
Calcium: 9.6 mg/dL (ref 8.4–10.5)
Chloride: 101 mEq/L (ref 96–112)
Creat: 0.95 mg/dL (ref 0.50–1.35)
Glucose, Bld: 74 mg/dL (ref 70–99)
Potassium: 4.4 mEq/L (ref 3.5–5.3)
SODIUM: 136 meq/L (ref 135–145)
TOTAL PROTEIN: 7.7 g/dL (ref 6.0–8.3)
Total Bilirubin: 0.7 mg/dL (ref 0.3–1.2)

## 2013-11-11 LAB — LIPID PANEL
Cholesterol: 140 mg/dL (ref 0–200)
HDL: 42 mg/dL (ref >39)
LDL Cholesterol: 75 mg/dL (ref 0–99)
Total CHOL/HDL Ratio: 3.3 Ratio
Triglycerides: 113 mg/dL (ref <150)
VLDL: 23 mg/dL (ref 0–40)

## 2013-11-11 LAB — POCT GLYCOSYLATED HEMOGLOBIN (HGB A1C): HEMOGLOBIN A1C: 6.2

## 2013-11-11 MED ORDER — METFORMIN HCL 500 MG PO TABS: ORAL_TABLET | ORAL | Status: DC

## 2013-11-11 MED ORDER — LISINOPRIL 5 MG PO TABS: 5.0000 mg | ORAL_TABLET | Freq: Every day | ORAL | Status: DC

## 2013-11-11 MED ORDER — TRIAMCINOLONE ACETONIDE 0.1 % EX CREA: 1.0000 "application " | TOPICAL_CREAM | Freq: Two times a day (BID) | CUTANEOUS | Status: DC

## 2013-11-11 NOTE — Progress Notes (Signed)
Subjective: Patient is here for recheck of his diabetes. He has been doing well. He is working on trying to lose weight now. We will about weight and exercise. He has not seen an eye doctor and we are making a referral to Dr. Gershon Crane for him. His wife sees Gershon Crane. He does not smoke. He gets some exercises and walks around the AM [place]. Review of systems is really unremarkable.  Objective: Overweight male in no major acute distress. Fundi appear benign. No carotid bruits. Chest clear. Heart regular without murmurs. And soft without mass or tenderness. Extremities normal. He apparently breaks out intermittently with some eczema. He only has a little bit on his ankle at this time.  Assessment: Type 2 diabetes mellitus Eczema  Plan: Triamcinolone cream Continue current medications Had a long talk with him about his diabetes. He is continued take an aspirin daily. I stressed the importance of regular visits.  Results for orders placed in visit on 11/11/13  POCT GLYCOSYLATED HEMOGLOBIN (HGB A1C)      Result Value Range   Hemoglobin A1C 6.2

## 2013-11-11 NOTE — Patient Instructions (Addendum)
Continue taking your medications in the same fashion  Advise seeing an eye doctor. Since your wife goes to Dr. Gershon Crane he would be a good one to see you. You should get checked yearly for diabetes.  Work hard on getting regular exercise and eat less. Especially cut back on the starches, things like breads, rice, posterior, potatoes. Learn to fill up on fruits and vegetables.  Use the cream on the rash twice daily when necessary

## 2014-01-18 ENCOUNTER — Encounter: Payer: Self-pay | Admitting: Family Medicine

## 2014-12-06 ENCOUNTER — Other Ambulatory Visit: Payer: Self-pay | Admitting: Family Medicine

## 2015-01-07 ENCOUNTER — Other Ambulatory Visit: Payer: Self-pay | Admitting: Family Medicine

## 2015-02-10 ENCOUNTER — Other Ambulatory Visit: Payer: Self-pay | Admitting: Physician Assistant

## 2015-02-20 ENCOUNTER — Ambulatory Visit (INDEPENDENT_AMBULATORY_CARE_PROVIDER_SITE_OTHER): Payer: BC Managed Care – PPO | Admitting: Family Medicine

## 2015-02-20 VITALS — BP 130/84 | HR 78 | Temp 97.6°F | Resp 18 | Ht 73.0 in | Wt 271.6 lb

## 2015-02-20 DIAGNOSIS — E119 Type 2 diabetes mellitus without complications: Secondary | ICD-10-CM

## 2015-02-20 DIAGNOSIS — I10 Essential (primary) hypertension: Secondary | ICD-10-CM | POA: Diagnosis not present

## 2015-02-20 DIAGNOSIS — M25511 Pain in right shoulder: Secondary | ICD-10-CM

## 2015-02-20 LAB — COMPREHENSIVE METABOLIC PANEL
ALT: 16 U/L (ref 0–53)
AST: 13 U/L (ref 0–37)
Albumin: 4 g/dL (ref 3.5–5.2)
Alkaline Phosphatase: 58 U/L (ref 39–117)
BUN: 8 mg/dL (ref 6–23)
CO2: 27 mEq/L (ref 19–32)
Calcium: 8.9 mg/dL (ref 8.4–10.5)
Chloride: 104 mEq/L (ref 96–112)
Creat: 0.79 mg/dL (ref 0.50–1.35)
Glucose, Bld: 153 mg/dL — ABNORMAL HIGH (ref 70–99)
Potassium: 4.3 mEq/L (ref 3.5–5.3)
Sodium: 138 mEq/L (ref 135–145)
Total Bilirubin: 0.5 mg/dL (ref 0.2–1.2)
Total Protein: 6.9 g/dL (ref 6.0–8.3)

## 2015-02-20 LAB — POCT GLYCOSYLATED HEMOGLOBIN (HGB A1C): Hemoglobin A1C: 6.5

## 2015-02-20 LAB — MICROALBUMIN, URINE: Microalb, Ur: 0.7 mg/dL (ref <2.0)

## 2015-02-20 LAB — GLUCOSE, POCT (MANUAL RESULT ENTRY): POC Glucose: 160 mg/dl — AB (ref 70–99)

## 2015-02-20 MED ORDER — DICLOFENAC SODIUM 75 MG PO TBEC: 75.0000 mg | DELAYED_RELEASE_TABLET | Freq: Two times a day (BID) | ORAL | Status: DC

## 2015-02-20 MED ORDER — LISINOPRIL 5 MG PO TABS: 5.0000 mg | ORAL_TABLET | Freq: Every day | ORAL | Status: DC

## 2015-02-20 MED ORDER — METFORMIN HCL 500 MG PO TABS: 500.0000 mg | ORAL_TABLET | Freq: Two times a day (BID) | ORAL | Status: DC

## 2015-02-20 NOTE — Progress Notes (Signed)
This a 64 year old gentleman who works at [employer]. He's in for refill on his medications.  Patient has a many year history of diabetes. Is having no new symptoms referable to that. He's also taking lisinopril to protect his kidneys and needs a refill.  Patient also complains about right shoulder pain which she's had 56 days. He's been no trauma. 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 He has had some discomfort moving his neck but this has resolved and is able to look both ways now without problem.  Objective:BP 130/84 mmHg  Pulse 78  Temp(Src) 97.6 F (36.4 C) (Oral)  Resp 18  Ht 6\' 1"  (1.854 m)  Wt 271 lb 9.6 oz (123.197 kg)  BMI 35.84 kg/m2  SpO2 99% HEENT: Unremarkable including funduscopic exam Neck: Supple Chest: Clear Heart: Regular no murmur Extremities: Good pulses, scar over left first metatarsal, posttraumatic swelling of the left ankle Patient has full range of motion of his neck, and right upper extremity with no focal tenderness or obvious bony abnormality. There is no rash in the area. Normal monofilament test  Results for orders placed or performed in visit on 02/20/15  POCT glucose (manual entry)  Result Value Ref Range   POC Glucose 160 (A) 70 - 99 mg/dl  POCT glycosylated hemoglobin (Hb A1C)  Result Value Ref Range   Hemoglobin A1C 6.5      Assessment:  64 year old gentleman with controlled diabetes and no complications. He has shoulder discomfort which is probably a strain and I think short-term dose of Voltaren be safe and effective.  This chart was scribed in my presence and reviewed by me personally.    ICD-9-CM ICD-10-CM   1. Essential hypertension 401.9 I10 lisinopril (PRINIVIL,ZESTRIL) 5 MG tablet     Comprehensive metabolic panel  2. Type 2 diabetes mellitus, controlled 250.00 E11.9 metFORMIN (GLUCOPHAGE) 500 MG tablet     POCT glucose (manual entry)     POCT glycosylated hemoglobin (Hb A1C)     Comprehensive metabolic panel     Microalbumin, urine  3. Type 2 diabetes mellitus without complication 656.81 E75.1 metFORMIN (GLUCOPHAGE) 500 MG tablet     POCT glucose (manual entry)     POCT glycosylated hemoglobin (Hb A1C)     Comprehensive metabolic panel     Microalbumin, urine  4. Pain in joint, shoulder region, right 719.41 M25.511 diclofenac (VOLTAREN) 75 MG EC tablet     Signed, Robyn Haber, MD

## 2015-03-05 ENCOUNTER — Other Ambulatory Visit: Payer: Self-pay | Admitting: Family Medicine

## 2015-03-05 ENCOUNTER — Telehealth: Payer: Self-pay | Admitting: Radiology

## 2015-03-05 DIAGNOSIS — M25511 Pain in right shoulder: Secondary | ICD-10-CM

## 2015-03-05 MED ORDER — DICLOFENAC SODIUM 75 MG PO TBEC: 75.0000 mg | DELAYED_RELEASE_TABLET | Freq: Two times a day (BID) | ORAL | Status: DC

## 2015-03-05 NOTE — Telephone Encounter (Signed)
Pt would like refill of Diclofenac.

## 2015-03-05 NOTE — Telephone Encounter (Signed)
Called pt to let him know. Pt could not here me on the phone.

## 2015-03-05 NOTE — Telephone Encounter (Signed)
This is fine, rx was sent. He is not to take any other NSAID like ibuprofen, advil, naproxen while taking diclofenac. If his pain continues beyond 1 week, he should come in for an office visit.

## 2015-03-05 NOTE — Telephone Encounter (Signed)
Assessment: 64 year old gentleman with controlled diabetes and no complications. He has shoulder discomfort which is probably a strain and I think short-term dose of Voltaren be safe and effective.  Can we refill?

## 2015-03-06 NOTE — Telephone Encounter (Signed)
Informed pt .

## 2015-03-12 ENCOUNTER — Ambulatory Visit (INDEPENDENT_AMBULATORY_CARE_PROVIDER_SITE_OTHER): Payer: BC Managed Care – PPO | Admitting: Family Medicine

## 2015-03-12 ENCOUNTER — Other Ambulatory Visit: Payer: Self-pay | Admitting: Urgent Care

## 2015-03-12 ENCOUNTER — Other Ambulatory Visit: Payer: Self-pay | Admitting: Family Medicine

## 2015-03-12 VITALS — BP 132/80 | HR 88 | Temp 98.0°F | Resp 18 | Ht 72.0 in | Wt 273.0 lb

## 2015-03-12 DIAGNOSIS — M5412 Radiculopathy, cervical region: Secondary | ICD-10-CM | POA: Diagnosis not present

## 2015-03-12 DIAGNOSIS — M542 Cervicalgia: Secondary | ICD-10-CM | POA: Diagnosis not present

## 2015-03-12 MED ORDER — CYCLOBENZAPRINE HCL 5 MG PO TABS: ORAL_TABLET | ORAL | Status: DC

## 2015-03-12 MED ORDER — PREDNISONE 20 MG PO TABS: 40.0000 mg | ORAL_TABLET | Freq: Every day | ORAL | Status: DC

## 2015-03-12 NOTE — Progress Notes (Signed)
Subjective:  This chart was scribed for Edward Ray, MD by [facility], medical scribe at [facility].The patient was seen in exam room 14 and the patient's care was started at 11:18 AM.   Patient ID: Edward Briggs, male    DOB: Sep 13, 1951, 64 y.o.   MRN: [id number] Chief Complaint  Patient presents with  . Arm Pain    right arm; x1.5 week(s); states he woke up one morning and it was hurting  . Shoulder Pain    right arm   HPI HPI Comments: Edward Briggs is a 65 y.o. male who presents to [facility] complaining of right arm pain about 2 weeks ago. Pt woke up and his right arm was bothering him. Pain starts at the right side of his neck and radiates down his arm. 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 No surgeries to his neck or shoulder. Seen on the 02/20/2015 by Dr. Joseph Art  for the same issue and given diclofenac which provided mild relief. He has not taken anything. He has a history of diabetes, A1C was 6.5 at April 19 th visit. He denies chest pain and  shortness of breath.   Patient Active Problem List   Diagnosis Date Noted  . Type II or unspecified type diabetes mellitus without mention of complication, uncontrolled 09/10/2012  . GASTROESOPHAGEAL REFLUX DISEASE 07/21/2009  . CHEST PAIN 07/21/2009   Past Medical History  Diagnosis Date  . GERD (gastroesophageal reflux disease)     no meds  . Arthritis   . Diabetes mellitus without complication    Past Surgical History  Procedure Laterality Date  . Skin graft  ~1970    Lt great toe (graft from Lt thigh)  . Abscess drainage    . Pilonidal cyst excision  ~1982  . Orif ankle fracture  12/03/2011    Procedure: OPEN REDUCTION INTERNAL FIXATION (ORIF) ANKLE FRACTURE;  Surgeon: [name], MD;  Location: [facility];  Service: Orthopedics;  Laterality: Left;  left medial malleolus fracture  . Hardware removal  03/26/2012      Procedure: HARDWARE REMOVAL;  Surgeon: [name], MD;  Location: [facility];  Service: Orthopedics;  Laterality: Left;  hardware removal deep left ankle syndesmotic screws only and stress xrays ankle   . Fracture surgery     No Known Allergies Prior to Admission medications   Medication Sig Start Date End Date Taking? Authorizing Provider  Ascorbic Acid (VITAMIN C PO) Take by mouth.   Yes Historical Provider, MD  aspirin 81 MG tablet Take 81 mg by mouth daily.   Yes Historical Provider, MD  Blood Glucose Monitoring Suppl (BLOOD GLUCOSE METER) kit Use as instructed 09/10/12  Yes [name], MD  diclofenac (VOLTAREN) 75 MG EC tablet Take 1 tablet (75 mg total) by mouth 2 (two) times daily. 03/05/15  Yes Jaynee Eagles, PA-C  HYDROcodone-acetaminophen (NORCO) 5-325 MG per tablet Take 1 tablet by mouth every 6 (six) hours as needed for pain. 01/05/13  Yes Darlyne Russian, MD  lisinopril (PRINIVIL,ZESTRIL) 5 MG tablet Take 1 tablet (5 mg total) by mouth daily. PT OVERDUE FOR CHECK UP. NEEDS OV FOR MORE REFILLS 02/20/15  Yes Robyn Haber, MD  metFORMIN (GLUCOPHAGE) 500 MG tablet Take 1 tablet (500 mg total) by mouth 2 (two) times daily with a meal. 02/20/15  Yes Robyn Haber, MD   History  Social History  . Marital Status: Married    Spouse Name: N/A  . Number of Children: N/A  . Years of Education: N/A   Occupational History  . Not on file.   Social History Main Topics  . Smoking status: Former Smoker    Quit date: 10/30/1988  . Smokeless tobacco: Not on file  . Alcohol Use: 0.6 oz/week    1 Shots of liquor per week     Comment: every two weeks  . Drug Use: No  . Sexual Activity: Yes    Birth Control/ Protection: None   Other Topics Concern  . Not on file   Social History Narrative   Review of Systems  Respiratory: Negative for shortness of breath.   Cardiovascular: Negative for chest pain.  Musculoskeletal: Positive for myalgias, neck pain and neck  stiffness.      Objective:  BP 132/80 mmHg  Pulse 88  Temp(Src) 98 F (36.7 C) (Oral)  Resp 18  Ht 6' (1.829 m)  Wt 273 lb (123.832 kg)  BMI 37.02 kg/m2  SpO2 96% Physical Exam  Constitutional: He is oriented to person, place, and time. He appears well-developed and well-nourished. No distress.  HENT:  Head: Normocephalic and atraumatic.  Eyes: Pupils are equal, round, and reactive to light.  Neck: Normal range of motion.  No midline bony tenderness, slight sasap of the trapizus and perispinal muscle of the neck Decreased extension, which also reproduces pain shooting down his arm. flull flexion Equal rotation Equal lateral flexion  Cardiovascular: Normal rate, regular rhythm and normal heart sounds.   No murmur heard. Pulmonary/Chest: Effort normal. No respiratory distress.  Musculoskeletal: Normal range of motion.  Right shoulder: Enderlin joint, AC joint and clavicle intact. Right shoulder full range of motion. Full rotator cuff strength. Equal strength and grip of upper extremities.   Neurological: He is alert and oriented to person, place, and time.  Difficulty with upper extremity reflex but equal.  Skin: Skin is warm and dry.  Psychiatric: He has a normal mood and affect. His behavior is normal.  Nursing note and vitals reviewed.     Assessment & Plan:   Edward Briggs is a 64 y.o. male Neck pain on right side - Plan: predniSONE (DELTASONE) 20 MG tablet, cyclobenzaprine (FLEXERIL) 5 MG tablet  Radiculopathy of cervical spine - Plan: predniSONE (DELTASONE) 20 MG tablet, cyclobenzaprine (FLEXERIL) 5 MG tablet  Suspected discogenic cause of pain - DDD with radiculopathy of R arm. No weakness.  -trial of adding flexeril to diclofenac as slight improvement.   -neck care manual  -If not improved in few days - start prednisone (STOP diclofenac at that time), and check blood sugars 3 times per day as diabetic, but has been well controlled.   -rtc precautions if pain persists  or worsening sx's sooner.   Meds ordered this encounter  Medications  . predniSONE (DELTASONE) 20 MG tablet    Sig: Take 2 tablets (40 mg total) by mouth daily with breakfast.    Dispense:  10 tablet    Refill:  0  . cyclobenzaprine (FLEXERIL) 5 MG tablet    Sig: 1 pill by mouth up to every 8 hours as needed. Start with one pill by mouth each bedtime as needed due to sedation    Dispense:  15 tablet    Refill:  0   Patient Instructions  You can try the cyclobenzaprine at bedtime along with the diclofenac next day or two. Also see the neck care  manual for other information.  If not improving in next few days - STOP diclofenac and start prednisone for 5 days. Check your blood sugar 3 times per day while taking prednisone and if over 250 - return to clinic for instructions.  Return to the clinic or go to the nearest emergency room if any of your symptoms worsen or new symptoms occur.  If neck pain is not improving after the prednisone, return for xray and other evaluation as you may need to see a specialist if this persists.       I personally performed the services described in this documentation, which was scribed in my presence. The recorded information has been reviewed and considered, and addended by me as needed.

## 2015-03-12 NOTE — Patient Instructions (Signed)
You can try the cyclobenzaprine at bedtime along with the diclofenac next day or two. Also see the neck care manual for other information.  If not improving in next few days - STOP diclofenac and start prednisone for 5 days. Check your blood sugar 3 times per day while taking prednisone and if over 250 - return to clinic for instructions.  Return to the clinic or go to the nearest emergency room if any of your symptoms worsen or new symptoms occur.  If neck pain is not improving after the prednisone, return for xray and other evaluation as you may need to see a specialist if this persists.

## 2015-05-31 ENCOUNTER — Encounter: Payer: Self-pay | Admitting: Gastroenterology

## 2015-08-20 ENCOUNTER — Encounter: Payer: Self-pay | Admitting: Family Medicine

## 2015-08-20 ENCOUNTER — Ambulatory Visit (INDEPENDENT_AMBULATORY_CARE_PROVIDER_SITE_OTHER): Payer: BC Managed Care – PPO | Admitting: Family Medicine

## 2015-08-20 VITALS — BP 125/79 | HR 80 | Temp 98.8°F | Resp 16 | Ht 71.5 in | Wt 261.0 lb

## 2015-08-20 DIAGNOSIS — Z1159 Encounter for screening for other viral diseases: Secondary | ICD-10-CM

## 2015-08-20 DIAGNOSIS — E119 Type 2 diabetes mellitus without complications: Secondary | ICD-10-CM | POA: Diagnosis not present

## 2015-08-20 DIAGNOSIS — Z125 Encounter for screening for malignant neoplasm of prostate: Secondary | ICD-10-CM

## 2015-08-20 DIAGNOSIS — I10 Essential (primary) hypertension: Secondary | ICD-10-CM

## 2015-08-20 DIAGNOSIS — Z130 Encounter for screening for diseases of the blood and blood-forming organs and certain disorders involving the immune mechanism: Secondary | ICD-10-CM | POA: Diagnosis not present

## 2015-08-20 DIAGNOSIS — Z0000 Encounter for general adult medical examination without abnormal findings: Secondary | ICD-10-CM | POA: Diagnosis not present

## 2015-08-20 DIAGNOSIS — Z114 Encounter for screening for human immunodeficiency virus [HIV]: Secondary | ICD-10-CM

## 2015-08-20 LAB — COMPLETE METABOLIC PANEL WITHOUT GFR
ALT: 16 U/L (ref 9–46)
AST: 15 U/L (ref 10–35)
Albumin: 4.2 g/dL (ref 3.6–5.1)
Alkaline Phosphatase: 60 U/L (ref 40–115)
BUN: 12 mg/dL (ref 7–25)
CO2: 26 mmol/L (ref 20–31)
Calcium: 9.5 mg/dL (ref 8.6–10.3)
Chloride: 106 mmol/L (ref 98–110)
Creat: 0.86 mg/dL (ref 0.70–1.25)
GFR, Est African American: >89 mL/min
GFR, Est Non African American: >89 mL/min
Glucose, Bld: 140 mg/dL — ABNORMAL HIGH (ref 65–99)
Potassium: 4.5 mmol/L (ref 3.5–5.3)
Sodium: 140 mmol/L (ref 135–146)
Total Bilirubin: 0.7 mg/dL (ref 0.2–1.2)
Total Protein: 7.4 g/dL (ref 6.1–8.1)

## 2015-08-20 LAB — LIPID PANEL
Cholesterol: 144 mg/dL (ref 125–200)
HDL: 44 mg/dL
LDL Cholesterol: 84 mg/dL
Total CHOL/HDL Ratio: 3.3 ratio
Triglycerides: 80 mg/dL
VLDL: 16 mg/dL

## 2015-08-20 LAB — CBC
HCT: 42.2 % (ref 39.0–52.0)
Hemoglobin: 15.1 g/dL (ref 13.0–17.0)
MCH: 32.8 pg (ref 26.0–34.0)
MCHC: 35.8 g/dL (ref 30.0–36.0)
MCV: 91.5 fL (ref 78.0–100.0)
MPV: 10.4 fL (ref 8.6–12.4)
Platelets: 267 K/uL (ref 150–400)
RBC: 4.61 MIL/uL (ref 4.22–5.81)
RDW: 12.9 % (ref 11.5–15.5)
WBC: 8.5 K/uL (ref 4.0–10.5)

## 2015-08-20 LAB — POCT GLYCOSYLATED HEMOGLOBIN (HGB A1C): Hemoglobin A1C: 6.8

## 2015-08-20 LAB — HEPATITIS C ANTIBODY: HCV Ab: NEGATIVE

## 2015-08-20 LAB — HEMOGLOBIN A1C: Hgb A1c MFr Bld: 6.8 % — AB (ref 4.0–6.0)

## 2015-08-20 LAB — GLUCOSE, POCT (MANUAL RESULT ENTRY): POC Glucose: 150 mg/dL — AB (ref 70–99)

## 2015-08-20 LAB — HIV ANTIBODY (ROUTINE TESTING W REFLEX): HIV 1&2 Ab, 4th Generation: NONREACTIVE

## 2015-08-20 MED ORDER — LISINOPRIL 5 MG PO TABS: 5.0000 mg | ORAL_TABLET | Freq: Every day | ORAL | Status: DC

## 2015-08-20 NOTE — Patient Instructions (Signed)
Check into date of last colonoscopy and tetanus shot and let us know.   I recommend flu vaccine, so return if you change your mind about this.   Start exercise - 150 minutes per week as recommended.   Schedule appointment with dentist (every 6 months) and eye care provider (once per year) as these are needed to monitor for complications of diabetes.   Keeping you healthy  Get these tests  Blood pressure- Have your blood pressure checked once a year by your healthcare provider.  Normal blood pressure is 120/80  Weight- Have your body mass index (BMI) calculated to screen for obesity.  BMI is a measure of body fat based on height and weight. You can also calculate your own BMI at ViewBanking.si.  Cholesterol- Have your cholesterol checked every year.  Diabetes- Have your blood sugar checked regularly if you have high blood pressure, high cholesterol, have a family history of diabetes or if you are overweight.  Screening for Colon Cancer- Colonoscopy starting at age 34.  Screening may begin sooner depending on your family history and other health conditions. Follow up colonoscopy as directed by your Gastroenterologist.  Screening for Prostate Cancer- Both blood work (PSA) and a rectal exam help screen for Prostate Cancer.  Screening begins at age 48 with African-American men and at age 73 with Caucasian men.  Screening may begin sooner depending on your family history.  Take these medicines  Aspirin- One aspirin daily can help prevent Heart disease and Stroke.  Flu shot- Every fall.  Tetanus- Every 10 years.  Zostavax- Once after the age of 86 to prevent Shingles.  Pneumonia shot- Once after the age of 30; if you are younger than 66, ask your healthcare provider if you need a Pneumonia shot.  Take these steps  Don't smoke- If you do smoke, talk to your doctor about quitting.  For tips on how to quit, go to www.smokefree.gov or call 1-800-QUIT-NOW.  Be physically active-  Exercise 5 days a week for at least 30 minutes.  If you are not already physically active start slow and gradually work up to 30 minutes of moderate physical activity.  Examples of moderate activity include walking briskly, mowing the yard, dancing, swimming, bicycling, etc.  Eat a healthy diet- Eat a variety of healthy food such as fruits, vegetables, low fat milk, low fat cheese, yogurt, lean meant, poultry, fish, beans, tofu, etc. For more information go to www.thenutritionsource.org  Drink alcohol in moderation- Limit alcohol intake to less than two drinks a day. Never drink and drive.  Dentist- Brush and floss twice daily; visit your dentist twice a year.  Depression- Your emotional health is as important as your physical health. If you're feeling down, or losing interest in things you would normally enjoy please talk to your healthcare provider.  Eye exam- Visit your eye doctor every year.  Safe sex- If you may be exposed to a sexually transmitted infection, use a condom.  Seat belts- Seat belts can save your life; always wear one.  Smoke/Carbon Monoxide detectors- These detectors need to be installed on the appropriate level of your home.  Replace batteries at least once a year.  Skin cancer- When out in the sun, cover up and use sunscreen 15 SPF or higher.  Violence- If anyone is threatening you, please tell your healthcare provider.  Living Will/ Health care power of attorney- Speak with your healthcare provider and family.

## 2015-08-20 NOTE — Progress Notes (Addendum)
Subjective:  This chart was scribed for Edward Ray, MD by [name], Medical Scribe. This patient was seen in room 29 and the patient's care was started 9:04 AM.    Patient ID: Edward Briggs, male    DOB: 1951/06/07, 64 y.o.   MRN: [id number]  HPI [name] is a 64 y.o. male Here today for a physical. HIV and Hep C will be drawn today.   Cancer Screening Colonoscopy: believes last done less than 10 years ago but does not recall exactly when or where.  Prostate exam recommended and was done today.   Immunizations Flu: He was recommended on flu shot but he declines the vaccine.  Tdap: He believes he had it done last in 2014.   Depression Depression screen PHQ 2/9 08/20/2015  Decreased Interest 0  Down, Depressed, Hopeless 0  PHQ - 2 Score 0    Vision  Visual Acuity Screening   Right eye Left eye Both eyes  Without correction: 20/30 20/25 20/25   With correction:     He denies seeing eye doctor.   Exercise He hasn't been exercising regularly lately.   Dentist He denies seeing dentist.   DM Lab Results  Component Value Date   HGBA1C 6.5 02/20/2015   Takes metformin 500 mg bid. Lisinopril 5 mg qd.  Lab Results  Component Value Date   MICROALBUR 0.7 02/20/2015   He usually takes metformin once a day. He might miss the second dose every now and then. He denies constipation and diarrhea with medication and denies symptomatic lows.    Patient Active Problem List   Diagnosis Date Noted  . Type II or unspecified type diabetes mellitus without mention of complication, uncontrolled 09/10/2012  . GASTROESOPHAGEAL REFLUX DISEASE 07/21/2009  . CHEST PAIN 07/21/2009   Past Medical History  Diagnosis Date  . GERD (gastroesophageal reflux disease)     no meds  . Arthritis   . Diabetes mellitus without complication [facility])    Past Surgical History  Procedure Laterality Date  . Skin graft  ~1970    Lt great toe (graft from Lt thigh)  . Abscess drainage      . Pilonidal cyst excision  ~1982  . Orif ankle fracture  12/03/2011    Procedure: OPEN REDUCTION INTERNAL FIXATION (ORIF) ANKLE FRACTURE;  Surgeon: [name], MD;  Location: [place];  Service: Orthopedics;  Laterality: Left;  left medial malleolus fracture  . Hardware removal  03/26/2012    Procedure: HARDWARE REMOVAL;  Surgeon: [name], MD;  Location: [place];  Service: Orthopedics;  Laterality: Left;  hardware removal deep left ankle syndesmotic screws only and stress xrays ankle   . Fracture surgery     No Known Allergies Prior to Admission medications   Medication Sig Start Date End Date Taking? Authorizing Provider  Ascorbic Acid (VITAMIN C PO) Take by mouth.    Historical Provider, MD  aspirin 81 MG tablet Take 81 mg by mouth daily.    Historical Provider, MD  Blood Glucose Monitoring Suppl (BLOOD GLUCOSE METER) kit Use as instructed 09/10/12   [name], MD  cyclobenzaprine (FLEXERIL) 5 MG tablet 1 pill by mouth up to every 8 hours as needed. Start with one pill by mouth each bedtime as needed due to sedation 03/12/15   [name], MD  diclofenac (VOLTAREN) 75 MG EC tablet Take 1 tablet (75 mg total) by mouth 2 (two) times daily. 03/05/15  Jaynee Eagles, PA-C  HYDROcodone-acetaminophen (NORCO) 5-325 MG per tablet Take 1 tablet by mouth every 6 (six) hours as needed for pain. 01/05/13   Darlyne Russian, MD  lisinopril (PRINIVIL,ZESTRIL) 5 MG tablet Take 1 tablet (5 mg total) by mouth daily. PT OVERDUE FOR CHECK UP. NEEDS OV FOR MORE REFILLS 02/20/15   Robyn Haber, MD  metFORMIN (GLUCOPHAGE) 500 MG tablet Take 1 tablet (500 mg total) by mouth 2 (two) times daily with a meal. 02/20/15   Robyn Haber, MD  predniSONE (DELTASONE) 20 MG tablet Take 2 tablets (40 mg total) by mouth daily with breakfast. 03/12/15   [name], MD   Social History   Social History  . Marital Status: Married    Spouse Name: N/A  . Number of Children: N/A   . Years of Education: N/A   Occupational History  . Not on file.   Social History Main Topics  . Smoking status: Former Smoker    Quit date: 10/30/1988  . Smokeless tobacco: Not on file  . Alcohol Use: 0.6 oz/week    1 Shots of liquor per week     Comment: every two weeks  . Drug Use: No  . Sexual Activity: Yes    Birth Control/ Protection: None   Other Topics Concern  . Not on file   Social History Narrative     Review of Systems  Constitutional: Negative for fatigue and unexpected weight change.  Eyes: Negative for visual disturbance.  Respiratory: Negative for cough, chest tightness and shortness of breath.   Cardiovascular: Negative for chest pain, palpitations and leg swelling.  Gastrointestinal: Negative for abdominal pain and blood in stool.  Neurological: Negative for dizziness, light-headedness and headaches.   13 point ROS reviewed, no positive findings    Objective:   Physical Exam  Constitutional: He is oriented to person, place, and time. He appears well-developed and well-nourished.  HENT:  Head: Normocephalic and atraumatic.  Right Ear: External ear normal.  Left Ear: External ear normal.  Mouth/Throat: Oropharynx is clear and moist.  Eyes: Conjunctivae and EOM are normal. Pupils are equal, round, and reactive to light.  Neck: Normal range of motion. Neck supple. No thyromegaly present.  Cardiovascular: Normal rate, regular rhythm, normal heart sounds and intact distal pulses.   Pulmonary/Chest: Effort normal and breath sounds normal. No respiratory distress. He has no wheezes.  Abdominal: Soft. He exhibits no distension. There is no tenderness. Hernia confirmed negative in the right inguinal area and confirmed negative in the left inguinal area.  Genitourinary: Prostate normal.  Musculoskeletal: Normal range of motion. He exhibits no edema or tenderness.  Lymphadenopathy:    He has no cervical adenopathy.  Neurological: He is alert and oriented to  person, place, and time. He has normal reflexes.  Skin: Skin is warm and dry.  Psychiatric: He has a normal mood and affect. His behavior is normal.  Vitals reviewed.   Filed Vitals:   08/20/15 0830  BP: 125/79  Pulse: 80  Temp: 98.8 F (37.1 C)  Resp: 16  Height: 5' 11.5" (1.816 m)  Weight: 261 lb (118.389 kg)   EKG: SR, no acute findings.  Results for orders placed or performed in visit on 08/20/15  POCT glucose (manual entry)  Result Value Ref Range   POC Glucose 150 (A) 70 - 99 mg/dl  POCT glycosylated hemoglobin (Hb A1C)  Result Value Ref Range   Hemoglobin A1C 6.8         Assessment &  Plan:   ANDRIS BROTHERS is a 64 y.o. male Annual physical exam - Plan: EKG 12-Lead  --anticipatory guidance as below in AVS, screening labs above. Health maintenance items as above in HPI discussed/recommended as applicable.   Need for hepatitis C screening test - Plan: Hepatitis C antibody  Screening for HIV (human immunodeficiency virus) - Plan: HIV antibody  Screening, anemia, deficiency, iron - Plan: CBC  Screening for prostate cancer - Plan: PSA  -We discussed pros and cons of prostate cancer screening, and after this discussion, he chose to have screening done. PSA obtained, and no concerning findings on DRE.   Controlled type 2 diabetes mellitus without complication, without long-term current use of insulin ([place]) - Plan: COMPLETE METABOLIC PANEL WITH GFR, Lipid panel, POCT glucose (manual entry), POCT glycosylated hemoglobin (Hb A1C), EKG 12-Lead  -continue same dose metformin. Wt loss and exercise encouraged.   Essential hypertension - Plan: lisinopril (PRINIVIL,ZESTRIL) 5 MG tablet, EKG 12-Lead  -stable, controlled on low dose Ace-I. No changes.   Meds ordered this encounter  Medications  . lisinopril (PRINIVIL,ZESTRIL) 5 MG tablet    Sig: Take 1 tablet (5 mg total) by mouth daily.    Dispense:  90 tablet    Refill:  1   Patient Instructions  Check into date of last  colonoscopy and tetanus shot and let us know.   I recommend flu vaccine, so return if you change your mind about this.   Start exercise - 150 minutes per week as recommended.   Schedule appointment with dentist (every 6 months) and eye care provider (once per year) as these are needed to monitor for complications of diabetes.   Keeping you healthy  Get these tests  Blood pressure- Have your blood pressure checked once a year by your healthcare provider.  Normal blood pressure is 120/80  Weight- Have your body mass index (BMI) calculated to screen for obesity.  BMI is a measure of body fat based on height and weight. You can also calculate your own BMI at ViewBanking.si.  Cholesterol- Have your cholesterol checked every year.  Diabetes- Have your blood sugar checked regularly if you have high blood pressure, high cholesterol, have a family history of diabetes or if you are overweight.  Screening for Colon Cancer- Colonoscopy starting at age 18.  Screening may begin sooner depending on your family history and other health conditions. Follow up colonoscopy as directed by your Gastroenterologist.  Screening for Prostate Cancer- Both blood work (PSA) and a rectal exam help screen for Prostate Cancer.  Screening begins at age 32 with African-American men and at age 64 with Caucasian men.  Screening may begin sooner depending on your family history.  Take these medicines  Aspirin- One aspirin daily can help prevent Heart disease and Stroke.  Flu shot- Every fall.  Tetanus- Every 10 years.  Zostavax- Once after the age of 76 to prevent Shingles.  Pneumonia shot- Once after the age of 63; if you are younger than 28, ask your healthcare provider if you need a Pneumonia shot.  Take these steps  Don't smoke- If you do smoke, talk to your doctor about quitting.  For tips on how to quit, go to www.smokefree.gov or call 1-800-QUIT-NOW.  Be physically active- Exercise 5 days a week  for at least 30 minutes.  If you are not already physically active start slow and gradually work up to 30 minutes of moderate physical activity.  Examples of moderate activity include walking briskly, mowing the  yard, dancing, swimming, bicycling, etc.  Eat a healthy diet- Eat a variety of healthy food such as fruits, vegetables, low fat milk, low fat cheese, yogurt, lean meant, poultry, fish, beans, tofu, etc. For more information go to www.thenutritionsource.org  Drink alcohol in moderation- Limit alcohol intake to less than two drinks a day. Never drink and drive.  Dentist- Brush and floss twice daily; visit your dentist twice a year.  Depression- Your emotional health is as important as your physical health. If you're feeling down, or losing interest in things you would normally enjoy please talk to your healthcare provider.  Eye exam- Visit your eye doctor every year.  Safe sex- If you may be exposed to a sexually transmitted infection, use a condom.  Seat belts- Seat belts can save your life; always wear one.  Smoke/Carbon Monoxide detectors- These detectors need to be installed on the appropriate level of your home.  Replace batteries at least once a year.  Skin cancer- When out in the sun, cover up and use sunscreen 15 SPF or higher.  Violence- If anyone is threatening you, please tell your healthcare provider.  Living Will/ Health care power of attorney- Speak with your healthcare provider and family.     By signing my name below, I, [name], attest that this documentation has been prepared under the direction and in the presence of Edward Ray, MD. Electronically Signed: [name], [place]. 08/20/2015 , 9:04 AM .  I personally performed the services described in this documentation, which was scribed in my presence. The recorded information has been reviewed and considered, and addended by me as needed.

## 2015-08-21 LAB — PSA: PSA: 0.38 ng/mL (ref <=4.00)

## 2015-08-27 ENCOUNTER — Encounter: Payer: Self-pay | Admitting: Family Medicine

## 2015-12-27 ENCOUNTER — Ambulatory Visit (INDEPENDENT_AMBULATORY_CARE_PROVIDER_SITE_OTHER): Payer: BC Managed Care – PPO | Admitting: Family Medicine

## 2015-12-27 VITALS — BP 126/84 | HR 86 | Temp 98.5°F | Resp 16 | Ht 72.0 in | Wt 259.0 lb

## 2015-12-27 DIAGNOSIS — E119 Type 2 diabetes mellitus without complications: Secondary | ICD-10-CM | POA: Diagnosis not present

## 2015-12-27 DIAGNOSIS — J069 Acute upper respiratory infection, unspecified: Secondary | ICD-10-CM

## 2015-12-27 LAB — POCT GLYCOSYLATED HEMOGLOBIN (HGB A1C)

## 2015-12-27 MED ORDER — GUAIFENESIN ER 1200 MG PO TB12: 1.0000 | ORAL_TABLET | Freq: Two times a day (BID) | ORAL | Status: DC | PRN

## 2015-12-27 MED ORDER — ALBUTEROL SULFATE (2.5 MG/3ML) 0.083% IN NEBU
2.5000 mg | INHALATION_SOLUTION | Freq: Once | RESPIRATORY_TRACT | Status: AC
  Administered 2015-12-27: 2.5 mg via RESPIRATORY_TRACT

## 2015-12-27 NOTE — Progress Notes (Signed)
Subjective:    Patient ID: Edward Briggs, male    DOB: 1951/05/03, 65 y.o.   MRN: [id number] By signing my name below, I, Zola Button, attest that this documentation has been prepared under the direction and in the presence of Delman Cheadle, MD.  Electronically Signed: Zola Button, Medical Scribe. 12/27/2015. 9:40 AM.  Chief Complaint  Patient presents with  . chest congestion    x 10 days   HPI HPI Comments: Edward Briggs is a 65 y.o. male who presents to the [facility] complaining of gradual onset productive cough with yellowish sputum that started 10 days ago. Patient reports having associated chest/nasal congestion and sore throat. He has been taking Benadryl and Robitussin for his symptoms. He has been able to sleep without difficulty. Patient denies SOB, fever and chills. He also denies history of asthma or smoking. He has missed work since Monday this past week and would like a note to return to work next Monday, in 4 days. He has not used an inhaler before. Patient states his blood sugars have been good; it was 133 this morning and 129 yesterday morning.   Patient reported his CBG to be 120s-140s in the mornings and occasional 150s-180s during the day but was not clear on when to check sugars or what to do with the results. Patient also requests his A1c be checked. His A1c was 6.8 four months ago at his CPE with Dr. [name]. He is on metformin 500 mg BID but usually only takes qd.  Past Medical History  Diagnosis Date  . GERD (gastroesophageal reflux disease)     no meds  . Arthritis   . Diabetes mellitus without complication [facility])    Past Surgical History  Procedure Laterality Date  . Skin graft  ~1970    Lt great toe (graft from Lt thigh)  . Abscess drainage    . Pilonidal cyst excision  ~1982  . Orif ankle fracture  12/03/2011    Procedure: OPEN REDUCTION INTERNAL FIXATION (ORIF) ANKLE FRACTURE;  Surgeon: [name], MD;  Location: [place];  Service: Orthopedics;  Laterality: Left;  left medial malleolus fracture  . Hardware removal  03/26/2012    Procedure: HARDWARE REMOVAL;  Surgeon: [name], MD;  Location: [place];  Service: Orthopedics;  Laterality: Left;  hardware removal deep left ankle syndesmotic screws only and stress xrays ankle   . Fracture surgery     Current Outpatient Prescriptions on File Prior to Visit  Medication Sig Dispense Refill  . Ascorbic Acid (VITAMIN C PO) Take by mouth.    [name] aspirin 81 MG tablet Take 81 mg by mouth daily.    . Blood Glucose Monitoring Suppl (BLOOD GLUCOSE METER) kit Use as instructed 1 each 0  . lisinopril (PRINIVIL,ZESTRIL) 5 MG tablet Take 1 tablet (5 mg total) by mouth daily. 90 tablet 1  . metFORMIN (GLUCOPHAGE) 500 MG tablet Take 1 tablet (500 mg total) by mouth 2 (two) times daily with a meal. 180 tablet 3   No current facility-administered medications on file prior to visit.   No Known Allergies Family History  Problem Relation Age of Onset  . Cancer Mother   . Cancer Father    Social History   Social History  . Marital Status: Married    Spouse Name: N/A  . Number of Children: N/A  . Years of Education: N/A   Social History Main Topics  .  Smoking status: Former Smoker    Quit date: 10/30/1988  . Smokeless tobacco: Never Used  . Alcohol Use: 0.6 oz/week    1 Shots of liquor per week     Comment: every two weeks  . Drug Use: No  . Sexual Activity: Yes    Birth Control/ Protection: None   Other Topics Concern  . None   Social History Narrative    Review of Systems  Constitutional: Positive for activity change, appetite change and fatigue. Negative for fever and chills.  HENT: Positive for congestion, postnasal drip, rhinorrhea, sinus pressure and sore throat. Negative for trouble swallowing and voice change.   Respiratory: Positive for cough and chest tightness. Negative for shortness of breath.   Cardiovascular: Negative  for chest pain.  Gastrointestinal: Negative for vomiting.  Neurological: Negative for numbness.  Psychiatric/Behavioral: Negative for sleep disturbance.       Objective:  BP 126/84 mmHg  Pulse 86  Temp(Src) 98.5 F (36.9 C) (Oral)  Resp 16  Ht 6' (1.829 m)  Wt 259 lb (117.482 kg)  BMI 35.12 kg/m2  SpO2 98%  Physical Exam  Constitutional: He is oriented to person, place, and time. He appears well-developed and well-nourished. No distress.  HENT:  Head: Normocephalic and atraumatic.  Left Ear: Tympanic membrane is injected.  Mouth/Throat: Posterior oropharyngeal erythema present. No oropharyngeal exudate.  Left naris edematous.  Eyes: Pupils are equal, round, and reactive to light.  Neck: Neck supple.  Anterior cervical adenopathy bilaterally.  Cardiovascular: Normal rate and regular rhythm.   Pulmonary/Chest: Effort normal. He has rhonchi in the right lower field.  Good air movement. Right lower lobe with inspiratory rhonchi.  Musculoskeletal: He exhibits no edema.  Lymphadenopathy:    He has cervical adenopathy.  Neurological: He is alert and oriented to person, place, and time. No cranial nerve deficit.  Skin: Skin is warm and dry. No rash noted.  Psychiatric: He has a normal mood and affect. His behavior is normal.  Nursing note and vitals reviewed.   Pulmonary exam is improved. Lungs clear, rhonchi cleared. Denies improvement in symptoms so declines offer of an albuterol inhaler, but agrees to pulmonary hygiene and start Mucinex.     Results for orders placed or performed in visit on 12/27/15  POCT glycosylated hemoglobin (Hb A1C)  Result Value Ref Range   Hemoglobin A1C 7.5f    Assessment & Plan:    1. URI, acute   2. Type 2 diabetes mellitus without complication, without long-term current use of insulin ([facility]) - spent over 20 min discussing above diabetic education - reviewed how and when to check cbgs and how to interpret them as well as diabetic diet.  Pt  was unaware that he was supposed to be taking the metformin twice a day and thinks he will easily be able to increase.    Orders Placed This Encounter  Procedures  . Ambulatory referral to diabetic education    Referral Priority:  Routine    Referral Type:  Consultation    Referral Reason:  Specialty Services Required    Number of Visits Requested:  1  . POCT glycosylated hemoglobin (Hb A1C)    Meds ordered this encounter  Medications  . albuterol (PROVENTIL) (2.5 MG/3ML) 0.083% nebulizer solution 2.5 mg    Sig:   . Guaifenesin (MUCINEX MAXIMUM STRENGTH) 1200 MG TB12    Sig: Take 1 tablet (1,200 mg total) by mouth every 12 (twelve) hours as needed.    Dispense:  14  tablet    Refill:  1   Over 25 min spent in face-to-face evaluation of and consultation with patient and coordination of care.  Over 50% of this time was spent counseling this patient.  I personally performed the services described in this documentation, which was scribed in my presence. The recorded information has been reviewed and considered, and addended by me as needed.  Delman Cheadle, MD MPH

## 2015-12-27 NOTE — Patient Instructions (Addendum)
Acute Bronchitis Bronchitis is inflammation of the airways that extend from the windpipe into the lungs (bronchi). The inflammation often causes mucus to develop. This leads to a cough, which is the most common symptom of bronchitis.  In acute bronchitis, the condition usually develops suddenly and goes away over time, usually in a couple weeks. Smoking, allergies, and asthma can make bronchitis worse. Repeated episodes of bronchitis may cause further lung problems.  CAUSES Acute bronchitis is most often caused by the same virus that causes a cold. The virus can spread from person to person (contagious) through coughing, sneezing, and touching contaminated objects. SIGNS AND SYMPTOMS   Cough.   Fever.   Coughing up mucus.   Body aches.   Chest congestion.   Chills.   Shortness of breath.   Sore throat.  DIAGNOSIS  Acute bronchitis is usually diagnosed through a physical exam. Your health care provider will also ask you questions about your medical history. Tests, such as chest X-rays, are sometimes done to rule out other conditions.  TREATMENT  Acute bronchitis usually goes away in a couple weeks. Oftentimes, no medical treatment is necessary. Medicines are sometimes given for relief of fever or cough. Antibiotic medicines are usually not needed but may be prescribed in certain situations. In some cases, an inhaler may be recommended to help reduce shortness of breath and control the cough. A cool mist vaporizer may also be used to help thin bronchial secretions and make it easier to clear the chest.  HOME CARE INSTRUCTIONS  Get plenty of rest.   Drink enough fluids to keep your urine clear or pale yellow (unless you have a medical condition that requires fluid restriction). Increasing fluids may help thin your respiratory secretions (sputum) and reduce chest congestion, and it will prevent dehydration.   Take medicines only as directed by your health care provider.  If  you were prescribed an antibiotic medicine, finish it all even if you start to feel better.  Avoid smoking and secondhand smoke. Exposure to cigarette smoke or irritating chemicals will make bronchitis worse. If you are a smoker, consider using nicotine gum or skin patches to help control withdrawal symptoms. Quitting smoking will help your lungs heal faster.   Reduce the chances of another bout of acute bronchitis by washing your hands frequently, avoiding people with cold symptoms, and trying not to touch your hands to your mouth, nose, or eyes.   Keep all follow-up visits as directed by your health care provider.  SEEK MEDICAL CARE IF: Your symptoms do not improve after 1 week of treatment.  SEEK IMMEDIATE MEDICAL CARE IF:  You develop an increased fever or chills.   You have chest pain.   You have severe shortness of breath.  You have bloody sputum.   You develop dehydration.  You faint or repeatedly feel like you are going to pass out.  You develop repeated vomiting.  You develop a severe headache. MAKE SURE YOU:   Understand these instructions.  Will watch your condition.  Will get help right away if you are not doing well or get worse.   This information is not intended to replace advice given to you by your health care provider. Make sure you discuss any questions you have with your health care provider.   Document Released: 11/27/2004 Document Revised: 11/10/2014 Document Reviewed: 04/12/2013 Elsevier Interactive Patient Education 2016 Elsevier Inc. Blood Glucose Monitoring, Adult Monitoring your blood glucose (also know as blood sugar) helps you to manage your diabetes.  It also helps you and your health care provider monitor your diabetes and determine how well your treatment plan is working. WHY SHOULD YOU MONITOR YOUR BLOOD GLUCOSE? It can help you understand how food, exercise, and medicine affect your blood glucose. It allows you to know what your blood  glucose is at any given moment. You can quickly tell if you are having low blood glucose (hypoglycemia) or high blood glucose (hyperglycemia). It can help you and your health care provider know how to adjust your medicines. It can help you understand how to manage an illness or adjust medicine for exercise. WHEN SHOULD YOU TEST? Your health care provider will help you decide how often you should check your blood glucose. This may depend on the type of diabetes you have, your diabetes control, or the types of medicines you are taking. Be sure to write down all of your blood glucose readings so that this information can be reviewed with your health care provider. See below for examples of testing times that your health care provider may suggest. Type 1 Diabetes Test at least 2 times per day if your diabetes is well controlled, if you are using an insulin pump, or if you perform multiple daily injections. If your diabetes is not well controlled or if you are sick, you may need to test more often. It is a good idea to also test: Before every insulin injection. Before and after exercise. Between meals and 2 hours after a meal. Occasionally between 2:00 a.m. and 3:00 a.m. Type 2 Diabetes If you are taking insulin, test at least 2 times per day. However, it is best to test before every insulin injection. If you take medicines by mouth (orally), test 2 times a day. If you are on a controlled diet, test once a day. If your diabetes is not well controlled or if you are sick, you may need to monitor more often. HOW TO MONITOR YOUR BLOOD GLUCOSE Supplies Needed Blood glucose meter. Test strips for your meter. Each meter has its own strips. You must use the strips that go with your own meter. A pricking needle (lancet). A device that holds the lancet (lancing device). A journal or log book to write down your results. Procedure Wash your hands with soap and water. Alcohol is not preferred. Prick the  side of your finger (not the tip) with the lancet. Gently milk the finger until a small drop of blood appears. Follow the instructions that come with your meter for inserting the test strip, applying blood to the strip, and using your blood glucose meter. Other Areas to Get Blood for Testing Some meters allow you to use other areas of your body (other than your finger) to test your blood. These areas are called alternative sites. The most common alternative sites are: The forearm. The thigh. The back area of the lower leg. The palm of the hand. The blood flow in these areas is slower. Therefore, the blood glucose values you get may be delayed, and the numbers are different from what you would get from your fingers. Do not use alternative sites if you think you are having hypoglycemia. Your reading will not be accurate. Always use a finger if you are having hypoglycemia. Also, if you cannot feel your lows (hypoglycemia unawareness), always use your fingers for your blood glucose checks. ADDITIONAL TIPS FOR GLUCOSE MONITORING Do not reuse lancets. Always carry your supplies with you. All blood glucose meters have a 24-hour "hotline" number to call  if you have questions or need help. Adjust (calibrate) your blood glucose meter with a control solution after finishing a few boxes of strips. BLOOD GLUCOSE RECORD KEEPING It is a good idea to keep a daily record or log of your blood glucose readings. Most glucose meters, if not all, keep your glucose records stored in the meter. Some meters come with the ability to download your records to your home computer. Keeping a record of your blood glucose readings is especially helpful if you are wanting to look for patterns. Make notes to go along with the blood glucose readings because you might forget what happened at that exact time. Keeping good records helps you and your health care provider to work together to achieve good diabetes management.    This  information is not intended to replace advice given to you by your health care provider. Make sure you discuss any questions you have with your health care provider.   Document Released: 10/23/2003 Document Revised: 11/10/2014 Document Reviewed: 03/14/2013 Elsevier Interactive Patient Education 2016 [employer]. Diabetes Mellitus and Food It is important for you to manage your blood sugar (glucose) level. Your blood glucose level can be greatly affected by what you eat. Eating healthier foods in the appropriate amounts throughout the day at about the same time each day will help you control your blood glucose level. It can also help slow or prevent worsening of your diabetes mellitus. Healthy eating may even help you improve the level of your blood pressure and reach or maintain a healthy weight.  General recommendations for healthful eating and cooking habits include:  Eating meals and snacks regularly. Avoid going long periods of time without eating to lose weight.  Eating a diet that consists mainly of plant-based foods, such as fruits, vegetables, nuts, legumes, and whole grains.  Using low-heat cooking methods, such as baking, instead of high-heat cooking methods, such as deep frying. Work with your dietitian to make sure you understand how to use the Nutrition Facts information on food labels. HOW CAN FOOD AFFECT ME? Carbohydrates Carbohydrates affect your blood glucose level more than any other type of food. Your dietitian will help you determine how many carbohydrates to eat at each meal and teach you how to count carbohydrates. Counting carbohydrates is important to keep your blood glucose at a healthy level, especially if you are using insulin or taking certain medicines for diabetes mellitus. Alcohol Alcohol can cause sudden decreases in blood glucose (hypoglycemia), especially if you use insulin or take certain medicines for diabetes mellitus. Hypoglycemia can be a life-threatening  condition. Symptoms of hypoglycemia (sleepiness, dizziness, and disorientation) are similar to symptoms of having too much alcohol.  If your health care provider has given you approval to drink alcohol, do so in moderation and use the following guidelines:  Women should not have more than one drink per day, and men should not have more than two drinks per day. One drink is equal to:  12 oz of beer.  5 oz of wine.  1 oz of hard liquor.  Do not drink on an empty stomach.  Keep yourself hydrated. Have water, diet soda, or unsweetened iced tea.  Regular soda, juice, and other mixers might contain a lot of carbohydrates and should be counted. WHAT FOODS ARE NOT RECOMMENDED? As you make food choices, it is important to remember that all foods are not the same. Some foods have fewer nutrients per serving than other foods, even though they might have the same  number of calories or carbohydrates. It is difficult to get your body what it needs when you eat foods with fewer nutrients. Examples of foods that you should avoid that are high in calories and carbohydrates but low in nutrients include:  Trans fats (most processed foods list trans fats on the Nutrition Facts label).  Regular soda.  Juice.  Candy.  Sweets, such as cake, pie, doughnuts, and cookies.  Fried foods. WHAT FOODS CAN I EAT? Eat nutrient-rich foods, which will nourish your body and keep you healthy. The food you should eat also will depend on several factors, including:  The calories you need.  The medicines you take.  Your weight.  Your blood glucose level.  Your blood pressure level.  Your cholesterol level. You should eat a variety of foods, including:  Protein.  Lean cuts of meat.  Proteins low in saturated fats, such as fish, egg whites, and beans. Avoid processed meats.  Fruits and vegetables.  Fruits and vegetables that may help control blood glucose levels, such as apples, mangoes, and  yams.  Dairy products.  Choose fat-free or low-fat dairy products, such as milk, yogurt, and cheese.  Grains, bread, pasta, and rice.  Choose whole grain products, such as multigrain bread, whole oats, and brown rice. These foods may help control blood pressure.  Fats.  Foods containing healthful fats, such as nuts, avocado, olive oil, canola oil, and fish. DOES EVERYONE WITH DIABETES MELLITUS HAVE THE SAME MEAL PLAN? Because every person with diabetes mellitus is different, there is not one meal plan that works for everyone. It is very important that you meet with a dietitian who will help you create a meal plan that is just right for you.   This information is not intended to replace advice given to you by your health care provider. Make sure you discuss any questions you have with your health care provider.   Document Released: 07/17/2005 Document Revised: 11/10/2014 Document Reviewed: 09/16/2013 Elsevier Interactive Patient Education [employer].

## 2016-03-04 ENCOUNTER — Other Ambulatory Visit: Payer: Self-pay | Admitting: Family Medicine

## 2016-03-05 ENCOUNTER — Other Ambulatory Visit: Payer: Self-pay | Admitting: Family Medicine

## 2016-08-06 ENCOUNTER — Other Ambulatory Visit: Payer: Self-pay | Admitting: Family Medicine

## 2016-08-06 NOTE — Telephone Encounter (Signed)
12/2015 last DM exam and HGA1C Dr. Brigitte Pulse

## 2016-08-08 NOTE — Telephone Encounter (Signed)
Needs Office Visit before any additional refills. Please let pt know.

## 2016-08-11 NOTE — Telephone Encounter (Signed)
Notified pt on VM that he needs OV for more RFs.

## 2016-08-17 ENCOUNTER — Other Ambulatory Visit: Payer: Self-pay | Admitting: Physician Assistant

## 2016-09-16 ENCOUNTER — Other Ambulatory Visit: Payer: Self-pay | Admitting: Physician Assistant

## 2016-09-16 NOTE — Telephone Encounter (Signed)
08/2015 last annual exam and labs 12/2015 last ov 03/2016  Last refill

## 2016-10-15 ENCOUNTER — Other Ambulatory Visit: Payer: Self-pay | Admitting: Physician Assistant

## 2016-10-17 ENCOUNTER — Ambulatory Visit (INDEPENDENT_AMBULATORY_CARE_PROVIDER_SITE_OTHER): Payer: BC Managed Care – PPO | Admitting: Physician Assistant

## 2016-10-17 VITALS — BP 128/80 | HR 74 | Temp 98.1°F | Resp 16 | Ht 72.0 in | Wt 263.0 lb

## 2016-10-17 DIAGNOSIS — J069 Acute upper respiratory infection, unspecified: Secondary | ICD-10-CM

## 2016-10-17 DIAGNOSIS — I10 Essential (primary) hypertension: Secondary | ICD-10-CM

## 2016-10-17 DIAGNOSIS — B9789 Other viral agents as the cause of diseases classified elsewhere: Secondary | ICD-10-CM

## 2016-10-17 MED ORDER — LISINOPRIL 5 MG PO TABS: 5.0000 mg | ORAL_TABLET | Freq: Every day | ORAL | 1 refills | Status: DC

## 2016-10-17 NOTE — Patient Instructions (Addendum)
DASH Eating Plan DASH stands for "Dietary Approaches to Stop Hypertension." The DASH eating plan is a healthy eating plan that has been shown to reduce high blood pressure (hypertension). Additional health benefits may include reducing the risk of type 2 diabetes mellitus, heart disease, and stroke. The DASH eating plan may also help with weight loss. What do I need to know about the DASH eating plan? For the DASH eating plan, you will follow these general guidelines:  Choose foods with less than 150 milligrams of sodium per serving (as listed on the food label).  Use salt-free seasonings or herbs instead of table salt or sea salt.  Check with your health care provider or pharmacist before using salt substitutes.  Eat lower-sodium products. These are often labeled as "low-sodium" or "no salt added."  Eat fresh foods. Avoid eating a lot of canned foods.  Eat more vegetables, fruits, and low-fat dairy products.  Choose whole grains. Look for the word "whole" as the first word in the ingredient list.  Choose fish and skinless chicken or turkey more often than red meat. Limit fish, poultry, and meat to 6 oz (170 g) each day.  Limit sweets, desserts, sugars, and sugary drinks.  Choose heart-healthy fats.  Eat more home-cooked food and less restaurant, buffet, and fast food.  Limit fried foods.  Do not fry foods. Cook foods using methods such as baking, boiling, grilling, and broiling instead.  When eating at a restaurant, ask that your food be prepared with less salt, or no salt if possible. What foods can I eat? Seek help from a dietitian for individual calorie needs. Grains  Whole grain or whole wheat bread. Brown rice. Whole grain or whole wheat pasta. Quinoa, bulgur, and whole grain cereals. Low-sodium cereals. Corn or whole wheat flour tortillas. Whole grain cornbread. Whole grain crackers. Low-sodium crackers. Vegetables  Fresh or frozen vegetables (raw, steamed, roasted, or  grilled). Low-sodium or reduced-sodium tomato and vegetable juices. Low-sodium or reduced-sodium tomato sauce and paste. Low-sodium or reduced-sodium canned vegetables. Fruits  All fresh, canned (in natural juice), or frozen fruits. Meat and Other Protein Products  Ground beef (85% or leaner), grass-fed beef, or beef trimmed of fat. Skinless chicken or turkey. Ground chicken or turkey. Pork trimmed of fat. All fish and seafood. Eggs. Dried beans, peas, or lentils. Unsalted nuts and seeds. Unsalted canned beans. Dairy  Low-fat dairy products, such as skim or 1% milk, 2% or reduced-fat cheeses, low-fat ricotta or cottage cheese, or plain low-fat yogurt. Low-sodium or reduced-sodium cheeses. Fats and Oils  Tub margarines without trans fats. Light or reduced-fat mayonnaise and salad dressings (reduced sodium). Avocado. Safflower, olive, or canola oils. Natural peanut or almond butter. Other  Unsalted popcorn and pretzels. The items listed above may not be a complete list of recommended foods or beverages. Contact your dietitian for more options.  What foods are not recommended? Grains  White bread. White pasta. White rice. Refined cornbread. Bagels and croissants. Crackers that contain trans fat. Vegetables  Creamed or fried vegetables. Vegetables in a cheese sauce. Regular canned vegetables. Regular canned tomato sauce and paste. Regular tomato and vegetable juices. Fruits  Canned fruit in light or heavy syrup. Fruit juice. Meat and Other Protein Products  Fatty cuts of meat. Ribs, chicken wings, bacon, sausage, bologna, salami, chitterlings, fatback, hot dogs, bratwurst, and packaged luncheon meats. Salted nuts and seeds. Canned beans with salt. Dairy  Whole or 2% milk, cream, half-and-half, and cream cheese. Whole-fat or sweetened yogurt. Full-fat cheeses   or blue cheese. Nondairy creamers and whipped toppings. Processed cheese, cheese spreads, or cheese curds. Condiments  Onion and garlic  salt, seasoned salt, table salt, and sea salt. Canned and packaged gravies. Worcestershire sauce. Tartar sauce. Barbecue sauce. Teriyaki sauce. Soy sauce, including reduced sodium. Steak sauce. Fish sauce. Oyster sauce. Cocktail sauce. Horseradish. Ketchup and mustard. Meat flavorings and tenderizers. Bouillon cubes. Hot sauce. Tabasco sauce. Marinades. Taco seasonings. Relishes. Fats and Oils  Butter, stick margarine, lard, shortening, ghee, and bacon fat. Coconut, palm kernel, or palm oils. Regular salad dressings. Other  Pickles and olives. Salted popcorn and pretzels. The items listed above may not be a complete list of foods and beverages to avoid. Contact your dietitian for more information.  Where can I find more information? National Heart, Lung, and Blood Institute: travelstabloid.com This information is not intended to replace advice given to you by your health care provider. Make sure you discuss any questions you have with your health care provider. Document Released: 10/09/2011 Document Revised: 03/27/2016 Document Reviewed: 08/24/2013 Elsevier Interactive Patient Education  2017 [employer].     IF you received an x-ray today, you will receive an invoice from [facility]. Please contact [facility] at [phone] with questions or concerns regarding your invoice.   IF you received labwork today, you will receive an invoice from [place] [facility]. Please contact [facility] at [phone] with questions or concerns regarding your invoice.   Our billing staff will not be able to assist you with questions regarding bills from these companies.  You will be contacted with the lab results as soon as they are available. The fastest way to get your results is to activate your My Chart account. Instructions are located on the last page of this paperwork. If you have not heard from [place] regarding the results in 2 weeks, please contact this office.

## 2016-10-17 NOTE — Progress Notes (Signed)
[facility] [address], [place] [address] [phone]  Date:  10/17/2016   Name:  Edward Briggs   DOB:  02-09-1951   MRN:  [id number]  PCP:  Default, Provider, MD    History of Present Illness:  Edward Briggs is a 65 y.o. male patient who presents to [facility] for cc of medication refill of lisinopril.  Hypertension ROS: taking medications as instructed, no medication side effects noted, no TIA's, no chest pain on exertion, no dyspnea on exertion and no swelling of ankles.   No vision changes or dizziness.    New concerns: recent nasal congestion that started about 1 week ago.  No fevers.  Not coughing.        Patient Active Problem List   Diagnosis Date Noted  . Type II or unspecified type diabetes mellitus without mention of complication, uncontrolled 09/10/2012  . GASTROESOPHAGEAL REFLUX DISEASE 07/21/2009  . CHEST PAIN 07/21/2009    Past Medical History:  Diagnosis Date  . Arthritis   . Diabetes mellitus without complication ([place])   . GERD (gastroesophageal reflux disease)    no meds    Past Surgical History:  Procedure Laterality Date  . ABSCESS DRAINAGE    . FRACTURE SURGERY    . HARDWARE REMOVAL  03/26/2012   Procedure: HARDWARE REMOVAL;  Surgeon: [name], MD;  Location: [place];  Service: Orthopedics;  Laterality: Left;  hardware removal deep left ankle syndesmotic screws only and stress xrays ankle   . ORIF ANKLE FRACTURE  12/03/2011   Procedure: OPEN REDUCTION INTERNAL FIXATION (ORIF) ANKLE FRACTURE;  Surgeon: [name], MD;  Location: [place];  Service: Orthopedics;  Laterality: Left;  left medial malleolus fracture  . PILONIDAL CYST EXCISION  ~1982  . SKIN GRAFT  ~1970   Lt great toe (graft from Lt thigh)    Social History  Substance Use Topics  . Smoking status: Former Smoker    Quit date: 10/30/1988  . Smokeless tobacco: Never Used  . Alcohol use 0.6 oz/week    1 Shots of  liquor per week     Comment: every two weeks    Family History  Problem Relation Age of Onset  . Cancer Mother   . Cancer Father     No Known Allergies  Medication list has been reviewed and updated.  Current Outpatient Prescriptions on File Prior to Visit  Medication Sig Dispense Refill  . Ascorbic Acid (VITAMIN C PO) Take by mouth.    [name] aspirin 81 MG tablet Take 81 mg by mouth daily.    . Blood Glucose Monitoring Suppl (BLOOD GLUCOSE METER) kit Use as instructed 1 each 0  . lisinopril (PRINIVIL,ZESTRIL) 5 MG tablet Take 1 tablet (5 mg total) by mouth daily. Patient needs office visit with new pcp before refills 30 tablet 0  . metFORMIN (GLUCOPHAGE) 500 MG tablet Take 1 tablet (500 mg total) by mouth 2 (two) times daily with a meal. Need office visit for any refills. 180 tablet 0  . Guaifenesin (MUCINEX MAXIMUM STRENGTH) 1200 MG TB12 Take 1 tablet (1,200 mg total) by mouth every 12 (twelve) hours as needed. (Patient not taking: Reported on 10/17/2016) 14 tablet 1   No current facility-administered medications on file prior to visit.     ROS ROS otherwise unremarkable unless listed above.   Physical Examination: BP 128/80 (BP Location: Right Arm, Patient Position: Sitting, Cuff Size: Small)   Pulse 74  Temp 98.1 F (36.7 C) (Oral)   Resp 16   Ht 6' (1.829 m)   Wt 263 lb (119.3 kg)   SpO2 97%   BMI 35.67 kg/m  Ideal Body Weight: Weight in (lb) to have BMI = 25: 183.9  Physical Exam  Appearance alert, well appearing, and in no distress, oriented to person, place, and time and well hydrated. General exam BP noted to be well controlled today in office, S1, S2 normal, no gallop, no murmur, chest clear, no JVD, no HSM, no edema.  Lab review: labs are reviewed, up to date and normal.   Assessment:   Hypertension stable.   Plan:  Current treatment plan is effective, no change in therapy..  Patient continues to have symptoms, he may contact by phone and directly send the  message to me.  I will issue an antibiotic.  Essential hypertension - Plan: Basic metabolic panel, lisinopril (PRINIVIL,ZESTRIL) 5 MG tablet  Viral URI  Ivar Drape, PA-C [facility] 12/15/20178:32 AM

## 2016-10-18 LAB — BASIC METABOLIC PANEL
BUN / CREAT RATIO: 14 (ref 10–24)
BUN: 12 mg/dL (ref 8–27)
CHLORIDE: 101 mmol/L (ref 96–106)
CO2: 23 mmol/L (ref 18–29)
Calcium: 9.4 mg/dL (ref 8.6–10.2)
Creatinine, Ser: 0.88 mg/dL (ref 0.76–1.27)
GFR calc Af Amer: 105 mL/min/{1.73_m2} (ref >59)
GFR calc non Af Amer: 91 mL/min/{1.73_m2} (ref >59)
GLUCOSE: 134 mg/dL — AB (ref 65–99)
POTASSIUM: 4.9 mmol/L (ref 3.5–5.2)
SODIUM: 140 mmol/L (ref 134–144)

## 2016-11-07 ENCOUNTER — Other Ambulatory Visit: Payer: Self-pay | Admitting: Family Medicine

## 2016-12-09 ENCOUNTER — Other Ambulatory Visit: Payer: Self-pay | Admitting: Family Medicine

## 2017-04-12 ENCOUNTER — Other Ambulatory Visit: Payer: Self-pay | Admitting: Physician Assistant

## 2017-04-12 DIAGNOSIS — I10 Essential (primary) hypertension: Secondary | ICD-10-CM

## 2017-04-23 ENCOUNTER — Encounter: Payer: Self-pay | Admitting: Family Medicine

## 2017-04-23 ENCOUNTER — Ambulatory Visit (INDEPENDENT_AMBULATORY_CARE_PROVIDER_SITE_OTHER): Payer: BC Managed Care – PPO | Admitting: Family Medicine

## 2017-04-23 VITALS — BP 125/82 | HR 81 | Temp 98.0°F | Resp 18 | Ht 71.65 in | Wt 260.0 lb

## 2017-04-23 DIAGNOSIS — E119 Type 2 diabetes mellitus without complications: Secondary | ICD-10-CM

## 2017-04-23 DIAGNOSIS — R059 Cough, unspecified: Secondary | ICD-10-CM

## 2017-04-23 DIAGNOSIS — Z13220 Encounter for screening for lipoid disorders: Secondary | ICD-10-CM | POA: Diagnosis not present

## 2017-04-23 DIAGNOSIS — J302 Other seasonal allergic rhinitis: Secondary | ICD-10-CM | POA: Diagnosis not present

## 2017-04-23 DIAGNOSIS — N400 Enlarged prostate without lower urinary tract symptoms: Secondary | ICD-10-CM | POA: Diagnosis not present

## 2017-04-23 DIAGNOSIS — I10 Essential (primary) hypertension: Secondary | ICD-10-CM | POA: Diagnosis not present

## 2017-04-23 DIAGNOSIS — R05 Cough: Secondary | ICD-10-CM | POA: Diagnosis not present

## 2017-04-23 DIAGNOSIS — L608 Other nail disorders: Secondary | ICD-10-CM | POA: Diagnosis not present

## 2017-04-23 DIAGNOSIS — Z0000 Encounter for general adult medical examination without abnormal findings: Secondary | ICD-10-CM

## 2017-04-23 DIAGNOSIS — Z1211 Encounter for screening for malignant neoplasm of colon: Secondary | ICD-10-CM | POA: Diagnosis not present

## 2017-04-23 DIAGNOSIS — Z125 Encounter for screening for malignant neoplasm of prostate: Secondary | ICD-10-CM

## 2017-04-23 DIAGNOSIS — Z23 Encounter for immunization: Secondary | ICD-10-CM

## 2017-04-23 MED ORDER — ZOSTER VAC RECOMB ADJUVANTED 50 MCG/0.5ML IM SUSR: 0.5000 mL | Freq: Once | INTRAMUSCULAR | 1 refills | Status: AC

## 2017-04-23 NOTE — Patient Instructions (Addendum)
Try over the counter claritin once per day to see if that helps cough. If not improving in next 2-3 weeks, return for possible xray and to look other causes. Lisinopril can also cause a cough, so may need to change to different blood pressure medication if cough continues.   Prevnar today, then other pneumonia vaccine in a year.   Schedule appointment with eye specialist and dentist.   I will refer you for a colonoscopy.  As we discussed the right side of your prostate appeared to be a little bit thicker or more prominent. I will check a PSA blood test, but would like you to meet with urology for another opinion and repeat exam. We will schedule that appointment. Let me know if you have questions in the meantime.   Cough, Adult Coughing is a reflex that clears your throat and your airways. Coughing helps to heal and protect your lungs. It is normal to cough occasionally, but a cough that happens with other symptoms or lasts a long time may be a sign of a condition that needs treatment. A cough may last only 2-3 weeks (acute), or it may last longer than 8 weeks (chronic). What are the causes? Coughing is commonly caused by:  Breathing in substances that irritate your lungs.  A viral or bacterial respiratory infection.  Allergies.  Asthma.  Postnasal drip.  Smoking.  Acid backing up from the stomach into the esophagus (gastroesophageal reflux).  Certain medicines.  Chronic lung problems, including COPD (or rarely, lung cancer).  Other medical conditions such as heart failure.  Follow these instructions at home: Pay attention to any changes in your symptoms. Take these actions to help with your discomfort:  Take medicines only as told by your health care provider. ? If you were prescribed an antibiotic medicine, take it as told by your health care provider. Do not stop taking the antibiotic even if you start to feel better. ? Talk with your health care provider before you take a  cough suppressant medicine.  Drink enough fluid to keep your urine clear or pale yellow.  If the air is dry, use a cold steam vaporizer or humidifier in your bedroom or your home to help loosen secretions.  Avoid anything that causes you to cough at work or at home.  If your cough is worse at night, try sleeping in a semi-upright position.  Avoid cigarette smoke. If you smoke, quit smoking. If you need help quitting, ask your health care provider.  Avoid caffeine.  Avoid alcohol.  Rest as needed.  Contact a health care provider if:  You have new symptoms.  You cough up pus.  Your cough does not get better after 2-3 weeks, or your cough gets worse.  You cannot control your cough with suppressant medicines and you are losing sleep.  You develop pain that is getting worse or pain that is not controlled with pain medicines.  You have a fever.  You have unexplained weight loss.  You have night sweats. Get help right away if:  You cough up blood.  You have difficulty breathing.  Your heartbeat is very fast. This information is not intended to replace advice given to you by your health care provider. Make sure you discuss any questions you have with your health care provider. Document Released: 04/18/2011 Document Revised: 03/27/2016 Document Reviewed: 12/27/2014 Elsevier Interactive Patient Education  2017 [employer].   Diabetes Mellitus and Standards of Medical Care Managing diabetes (diabetes mellitus) can be complicated.  Your diabetes treatment may be managed by a team of health care providers, including:  A diet and nutrition specialist (registered dietitian).  A nurse.  A certified diabetes educator (CDE).  A diabetes specialist (endocrinologist).  An eye doctor.  A primary care provider.  A dentist.  Your health care providers follow a schedule in order to help you get the best quality of care. The following schedule is a general guideline for your  diabetes management plan. Your health care providers may also give you more specific instructions. HbA1c ( hemoglobin A1c) test This test provides information about blood sugar (glucose) control over the previous 2-3 months. It is used to check whether your diabetes management plan needs to be adjusted.  If you are meeting your treatment goals, this test is done at least 2 times a year.  If you are not meeting treatment goals or if your treatment goals have changed, this test is done 4 times a year.  Blood pressure test  This test is done at every routine medical visit. For most people, the goal is less than 130/80. Ask your health care provider what your goal blood pressure should be. Dental and eye exams  Visit your dentist two times a year.  If you have type 1 diabetes, get an eye exam 3-5 years after you are diagnosed, and then once a year after your first exam. ? If you were diagnosed with type 1 diabetes as a child, get an eye exam when you are age 32 or older and have had diabetes for 3-5 years. After the first exam, you should get an eye exam once a year.  If you have type 2 diabetes, have an eye exam as soon as you are diagnosed, and then once a year after your first exam. Foot care exam  Visual foot exams are done at every routine medical visit. The exams check for cuts, bruises, redness, blisters, sores, or other problems with the feet.  A complete foot exam is done by your health care provider once a year. This exam includes an inspection of the structure and skin of your feet, and a check of the pulses and sensation in your feet. ? Type 1 diabetes: Get your first exam 3-5 years after diagnosis. ? Type 2 diabetes: Get your first exam as soon as you are diagnosed.  Check your feet every day for cuts, bruises, redness, blisters, or sores. If you have any of these or other problems that are not healing, contact your health care provider. Kidney function test ( urine  microalbumin)  This test is done once a year. ? Type 1 diabetes: Get your first test 5 years after diagnosis. ? Type 2 diabetes: Get your first test as soon as you are diagnosed.  If you have chronic kidney disease (CKD), get a serum creatinine and estimated glomerular filtration rate (eGFR) test once a year. Lipid profile (cholesterol, HDL, LDL, triglycerides)  This test should be done when you are diagnosed with diabetes, and every 5 years after the first test. If you are on medicines to lower your cholesterol, you may need to get this test done every year. ? The goal for LDL is less than 100 mg/dL (5.5 mmol/L). If you are at high risk, the goal is less than 70 mg/dL (3.9 mmol/L). ? The goal for HDL is 40 mg/dL (2.2 mmol/L) for men and 50 mg/dL(2.8 mmol/L) for women. An HDL cholesterol of 60 mg/dL (3.3 mmol/L) or higher gives some protection against heart  disease. ? The goal for triglycerides is less than 150 mg/dL (8.3 mmol/L). Immunizations  The yearly flu (influenza) vaccine is recommended for everyone 6 months or older who has diabetes.  The pneumonia (pneumococcal) vaccine is recommended for everyone 2 years or older who has diabetes. If you are 45 or older, you may get the pneumonia vaccine as a series of two separate shots.  The hepatitis B vaccine is recommended for adults shortly after they have been diagnosed with diabetes.  The Tdap (tetanus, diphtheria, and pertussis) vaccine should be given: ? According to normal childhood vaccination schedules, for children. ? Every 10 years, for adults who have diabetes.  The shingles vaccine is recommended for people who have had chicken pox and are 50 years or older. Mental and emotional health  Screening for symptoms of eating disorders, anxiety, and depression is recommended at the time of diagnosis and afterward as needed. If your screening shows that you have symptoms (you have a positive screening result), you may need further  evaluation and be referred to a mental health care provider. Diabetes self-management education  Education about how to manage your diabetes is recommended at diagnosis and ongoing as needed. Treatment plan  Your treatment plan will be reviewed at every medical visit. Summary  Managing diabetes (diabetes mellitus) can be complicated. Your diabetes treatment may be managed by a team of health care providers.  Your health care providers follow a schedule in order to help you get the best quality of care.  Standards of care including having regular physical exams, blood tests, blood pressure monitoring, immunizations, screening tests, and education about how to manage your diabetes.  Your health care providers may also give you more specific instructions based on your individual health. This information is not intended to replace advice given to you by your health care provider. Make sure you discuss any questions you have with your health care provider. Document Released: 08/17/2009 Document Revised: 07/18/2016 Document Reviewed: 07/18/2016 Elsevier Interactive Patient Education  2018 [place] you healthy  Get these tests  Blood pressure- Have your blood pressure checked once a year by your healthcare provider.  Normal blood pressure is 120/80  Weight- Have your body mass index (BMI) calculated to screen for obesity.  BMI is a measure of body fat based on height and weight. You can also calculate your own BMI at [email or web address].  Cholesterol- Have your cholesterol checked every year.  Diabetes- Have your blood sugar checked regularly if you have high blood pressure, high cholesterol, have a family history of diabetes or if you are overweight.  Screening for Colon Cancer- Colonoscopy starting at age 51.  Screening may begin sooner depending on your family history and other health conditions. Follow up colonoscopy as directed by your  Gastroenterologist.  Screening for Prostate Cancer- Both blood work (PSA) and a rectal exam help screen for Prostate Cancer.  Screening begins at age 39 with African-American men and at age 59 with Caucasian men.  Screening may begin sooner depending on your family history.  Take these medicines  Aspirin- One aspirin daily can help prevent Heart disease and Stroke.  Flu shot- Every fall.  Tetanus- Every 10 years.  Zostavax- Once after the age of 28 to prevent Shingles.  Pneumonia shot- Once after the age of 47; if you are younger than 47, ask your healthcare provider if you need a Pneumonia shot.  Take these steps  Don't smoke- If you do smoke, talk to your doctor about  quitting.  For tips on how to quit, go to www.smokefree.gov or call 1-800-QUIT-NOW.  Be physically active- Exercise 5 days a week for at least 30 minutes.  If you are not already physically active start slow and gradually work up to 30 minutes of moderate physical activity.  Examples of moderate activity include walking briskly, mowing the yard, dancing, swimming, bicycling, etc.  Eat a healthy diet- Eat a variety of healthy food such as fruits, vegetables, low fat milk, low fat cheese, yogurt, lean meant, poultry, fish, beans, tofu, etc. For more information go to www.thenutritionsource.org  Drink alcohol in moderation- Limit alcohol intake to less than two drinks a day. Never drink and drive.  Dentist- Brush and floss twice daily; visit your dentist twice a year.  Depression- Your emotional health is as important as your physical health. If you're feeling down, or losing interest in things you would normally enjoy please talk to your healthcare provider.  Eye exam- Visit your eye doctor every year.  Safe sex- If you may be exposed to a sexually transmitted infection, use a condom.  Seat belts- Seat belts can save your life; always wear one.  Smoke/Carbon Monoxide detectors- These detectors need to be installed on  the appropriate level of your home.  Replace batteries at least once a year.  Skin cancer- When out in the sun, cover up and use sunscreen 15 SPF or higher.  Violence- If anyone is threatening you, please tell your healthcare provider.  Living Will/ Health care power of attorney- Speak with your healthcare provider and family.   IF you received an x-ray today, you will receive an invoice from [facility]. Please contact [facility] at [phone] with questions or concerns regarding your invoice.   IF you received labwork today, you will receive an invoice from [facility]. Please contact [facility] at [phone] with questions or concerns regarding your invoice.   Our billing staff will not be able to assist you with questions regarding bills from these companies.  You will be contacted with the lab results as soon as they are available. The fastest way to get your results is to activate your My Chart account. Instructions are located on the last page of this paperwork. If you have not heard from [place] regarding the results in 2 weeks, please contact this office.

## 2017-04-23 NOTE — Progress Notes (Signed)
   Subjective:    Patient ID: Edward Briggs, male    DOB: 29-May-1951, 66 y.o.   MRN: [id number]  HPI    Review of Systems  Constitutional: Positive for activity change.  HENT: Negative.   Eyes: Negative.   Respiratory: Positive for cough.   Cardiovascular: Negative.   Gastrointestinal: Negative.   Endocrine: Negative.   Genitourinary: Negative.   Musculoskeletal: Negative.   Skin: Negative.   Allergic/Immunologic: Positive for environmental allergies.  Neurological: Negative.   Hematological: Negative.   Psychiatric/Behavioral: Negative.        Objective:   Physical Exam        Assessment & Plan:

## 2017-04-23 NOTE — Progress Notes (Addendum)
Subjective:  By signing my name below, I, Edward Briggs, attest that this documentation has been prepared under the direction and in the presence of Edward Ray, MD. Electronically Signed: Moises Briggs, [place]. 04/23/2017 , 3:38 PM .  Patient was seen in Room 26 .   Patient ID: Edward Briggs, male    DOB: [birth year], 66 y.o.   MRN: [id number] Chief Complaint  Patient presents with  . Annual Exam   HPI REMIJIO Briggs is a 66 y.o. male Here for annual physical. His last physical with me was in October 2016; I have not seen him since that time, but he's seen other providers.   Cough He states he's had this cough for a couple of months now. He believes it's caused by seasonal allergies. He's just been dealing with it. He denies coughing up Briggs, unexpected weight loss, rhinorrhea or congestion.   DM Lab Results  Component Value Date   HGBA1C 7.64f02/23/2017   He was last evaluated in Feb 2017, and is taking metformin 5056mQD.   Lab Results  Component Value Date   MICROALBUR 0.7 02/20/2015   HTN Lab Results  Component Value Date   CREATININE 0.88 10/17/2016   He takes lisinopril 85m78mD.   Lipid screening Lab Results  Component Value Date   CHOL 144 08/20/2015   HDL 44 08/20/2015   LDLCALC 84 08/20/2015   TRIG 80 08/20/2015   CHOLHDL 3.3 08/20/2015    Cancer Screening Colonoscopy: He believes his last colonoscopy was when he was [age]ars old. He denies known family history of colon cancer.  Prostate cancer screening:  Lab Results  Component Value Date   PSA 0.38 08/20/2015   He denies known family history of prostate cancer.  He urinates approximately twice per night. Sometimes once, sometimes more. Overall 2 times per night. No recent changes.  Immunizations  There is no immunization history on file for this patient.  He agrees to receiving pneumonia vaccine today.  He will check Shingles vaccine with his pharmacy.   Depression Depression screen  [facility] 04/23/2017 10/17/2016 12/27/2015 08/20/2015  Decreased Interest 0 0 0 0  Down, Depressed, Hopeless 0 0 0 0  PHQ - 2 Score 0 0 0 0   Fall screening He denies any falls in the past year.   Vision  Visual Acuity Screening   Right eye Left eye Both eyes  Without correction: _0  With correction:     Hearing Screening Comments: Left Ear- 6 feet Right Ear 6 feet  He hasn't seen his eye doctor in a while.   Dentist He's followed by dentist, but hasn't seen for a while.   Exercise Activity Change He's been keeping his yard clean and cut. He also has a [place] on the backside of his house and keeping that area cleaned up as well. He notes, "I went from slow to fast". He denies chest pain or shortness of breath with this activity.   Functional Status Survey: Is the patient deaf or have difficulty hearing?: No Does the patient have difficulty seeing, even when wearing glasses/contacts?: No Does the patient have difficulty concentrating, remembering, or making decisions?: No Does the patient have difficulty walking or climbing stairs?: No Does the patient have difficulty dressing or bathing?: No Does the patient have difficulty doing errands alone such as visiting a doctor's office or shopping?: No  Advanced Directives He does not have advanced directives, and declined information.   Patient Active  Problem List   Diagnosis Date Noted  . Type II or unspecified type diabetes mellitus without mention of complication, uncontrolled 09/10/2012  . GASTROESOPHAGEAL REFLUX DISEASE 07/21/2009  . CHEST PAIN 07/21/2009   Past Medical History:  Diagnosis Date  . Arthritis   . Diabetes mellitus without complication ([place])   . GERD (gastroesophageal reflux disease)    no meds   Past Surgical History:  Procedure Laterality Date  . ABSCESS DRAINAGE    . FRACTURE SURGERY    . HARDWARE REMOVAL  03/26/2012   Procedure: HARDWARE REMOVAL;  Surgeon: [name], MD;  Location:  [facility];  Service: Orthopedics;  Laterality: Left;  hardware removal deep left ankle syndesmotic screws only and stress xrays ankle   . ORIF ANKLE FRACTURE  12/03/2011   Procedure: OPEN REDUCTION INTERNAL FIXATION (ORIF) ANKLE FRACTURE;  Surgeon: [name], MD;  Location: [facility];  Service: Orthopedics;  Laterality: Left;  left medial malleolus fracture  . PILONIDAL CYST EXCISION  ~1982  . SKIN GRAFT  ~1970   Lt great toe (graft from Lt thigh)   No Known Allergies Prior to Admission medications   Medication Sig Start Date End Date Taking? Authorizing Provider  Ascorbic Acid (VITAMIN C PO) Take by mouth.   Yes [provider]  aspirin 81 MG tablet Take 81 mg by mouth daily.   Yes [provider]  Briggs Glucose Monitoring Suppl (Briggs GLUCOSE METER) kit Use as instructed 09/10/12  Yes [name], MD  Guaifenesin [facility] MAXIMUM STRENGTH) 1200 MG TB12 Take 1 tablet (1,200 mg total) by mouth every 12 (twelve) hours as needed. 12/27/15  Yes Shawnee Knapp, MD  lisinopril (PRINIVIL,ZESTRIL) 5 MG tablet TAKE 1 TABLET (5 MG TOTAL) BY MOUTH DAILY. PATIENT NEEDS OFFICE VISIT WITH NEW PCP BEFORE REFILLS 04/14/17  Yes English, Richardton D, PA  metFORMIN (GLUCOPHAGE) 500 MG tablet TAKE 1 TABLET BY MOUTH TWICE A DAY WITH A MEAL 12/09/16  Yes Shawnee Knapp, MD   Social History   Social History  . Marital status: Married    Spouse name: N/A  . Number of children: N/A  . Years of education: N/A   Occupational History  . Not on file.   Social History Main Topics  . Smoking status: Former Smoker    Quit date: 10/30/1988  . Smokeless tobacco: Never Used  . Alcohol use 0.6 oz/week    1 Shots of liquor per week     Comment: every two weeks  . Drug use: No  . Sexual activity: Yes    Birth control/ protection: None   Other Topics Concern  . Not on file   Social History Narrative  . No narrative on file   Review of Systems 13 point ROS -  positive for activity change, cough and seasonal allergies    Objective:   Physical Exam  Constitutional: He is oriented to person, place, and time. He appears well-developed and well-nourished.  HENT:  Head: Normocephalic and atraumatic.  Right Ear: External ear normal.  Left Ear: External ear normal.  Mouth/Throat: Oropharynx is clear and moist.  Eyes: Conjunctivae and EOM are normal. Pupils are equal, round, and reactive to light.  Neck: Normal range of motion. Neck supple. No thyromegaly present.  Cardiovascular: Normal rate, regular rhythm, normal heart sounds and intact distal pulses.   Pulmonary/Chest: Effort normal and breath sounds normal. No respiratory distress. He has no wheezes.  Abdominal: Soft. He exhibits no distension.  There is no tenderness. Hernia confirmed negative in the right inguinal area and confirmed negative in the left inguinal area.  Genitourinary: Prostate is enlarged (Enlarged with firm/asymmetric prominence of right side.). Prostate is not tender.  Musculoskeletal: Normal range of motion. He exhibits no edema or tenderness.  Thickened curved overgrown toenails bilaterally, L>R Scar over the left great toe, and left medial ankle  Lymphadenopathy:    He has no cervical adenopathy.  Neurological: He is alert and oriented to person, place, and time. He has normal reflexes.  Skin: Skin is warm and dry.  Psychiatric: He has a normal mood and affect. His behavior is normal.  Vitals reviewed.   Vitals:   04/23/17 1523  BP: 125/82  Pulse: 81  Resp: 18  Temp: 98 F (36.7 C)  TempSrc: Oral  SpO2: 98%  Weight: 260 lb (117.9 kg)  Height: 5' 11.65" (1.82 m)      Assessment & Plan:    EMAURI KRYGIER is a 66 y.o. male Annual physical exam  .-anticipatory guidance as below in AVS, screening labs above. Health maintenance items as above in HPI discussed/recommended as applicable.   Type 2 diabetes mellitus without complication, without long-term current use  of insulin (HCC)  - Check A1c, urine microalbumin. Continue metformin same dose. With thickened nails will refer to podiatry for nail care.  -  -Typical timing of A1c and other maintenance items for diabetes given on handout.  -Plan on recheck in 3 months, possible 6 months depending on control.   Cough Seasonal allergic rhinitis, unspecified trigger  Initial trial of Claritin, then recheck in next few weeks if not improved. Consider ACE inhibitor cough, or possible chest x-Briggs at that time if persistent. RTC precautions if worsening sooner  Essential hypertension  - Stable. Routine labs obtained. No med changes at this time.  Screen for colon cancer  -Refer to gastroenterology for colonoscopy.  Screening for prostate cancer  - We discussed pros and cons of prostate cancer screening, and after this discussion, he chose to have screening done. PSA obtained.  Possible asymmetric prostate. We'll refer to urology for repeat exam and evaluation.   Screening for hyperlipidemia  - Check CMP, lipid panel. If elevated, would recommend statin as diabetic.  Need for shingles vaccine  - Shingles vaccine printed for dispense/injection at pharmacy  Need for prophylactic vaccination against Streptococcus pneumoniae (pneumococcus)  - Prevnar given, plan on Pneumovax at future visit.  No orders of the defined types were placed in this encounter.  Patient Instructions   Try over the counter claritin once per day to see if that helps cough. If not improving in next 2-3 weeks, return for possible xray and to look other causes. Lisinopril can also cause a cough, so may need to change to different Briggs pressure medication if cough continues.   Prevnar today, then other pneumonia vaccine in a year.   Schedule appointment with eye specialist and dentist.   I will refer you for a colonoscopy.  As we discussed the right side of your prostate appeared to be a little bit thicker or more prominent. I will  check a PSA Briggs test, but would like you to meet with urology for another opinion and repeat exam. We will schedule that appointment. Let me know if you have questions in the meantime.   Cough, Adult Coughing is a reflex that clears your throat and your airways. Coughing helps to heal and protect your lungs. It is normal to cough occasionally,  but a cough that happens with other symptoms or lasts a long time may be a sign of a condition that needs treatment. A cough may last only 2-3 weeks (acute), or it may last longer than 8 weeks (chronic). What are the causes? Coughing is commonly caused by:  Breathing in substances that irritate your lungs.  A viral or bacterial respiratory infection.  Allergies.  Asthma.  Postnasal drip.  Smoking.  Acid backing up from the stomach into the esophagus (gastroesophageal reflux).  Certain medicines.  Chronic lung problems, including COPD (or rarely, lung cancer).  Other medical conditions such as heart failure.  Follow these instructions at home: Pay attention to any changes in your symptoms. Take these actions to help with your discomfort:  Take medicines only as told by your health care provider. ? If you were prescribed an antibiotic medicine, take it as told by your health care provider. Do not stop taking the antibiotic even if you start to feel better. ? Talk with your health care provider before you take a cough suppressant medicine.  Drink enough fluid to keep your urine clear or pale yellow.  If the air is dry, use a cold steam vaporizer or humidifier in your bedroom or your home to help loosen secretions.  Avoid anything that causes you to cough at work or at home.  If your cough is worse at night, try sleeping in a semi-upright position.  Avoid cigarette smoke. If you smoke, quit smoking. If you need help quitting, ask your health care provider.  Avoid caffeine.  Avoid alcohol.  Rest as needed.  Contact a health care  provider if:  You have new symptoms.  You cough up pus.  Your cough does not get better after 2-3 weeks, or your cough gets worse.  You cannot control your cough with suppressant medicines and you are losing sleep.  You develop pain that is getting worse or pain that is not controlled with pain medicines.  You have a fever.  You have unexplained weight loss.  You have night sweats. Get help right away if:  You cough up Briggs.  You have difficulty breathing.  Your heartbeat is very fast. This information is not intended to replace advice given to you by your health care provider. Make sure you discuss any questions you have with your health care provider. Document Released: 04/18/2011 Document Revised: 03/27/2016 Document Reviewed: 12/27/2014 Elsevier Interactive Patient Education  2017 [place] [employer].   Diabetes Mellitus and Standards of Medical Care Managing diabetes (diabetes mellitus) can be complicated. Your diabetes treatment may be managed by a team of health care providers, including:  A diet and nutrition specialist (registered dietitian).  A nurse.  A certified diabetes educator (CDE).  A diabetes specialist (endocrinologist).  An eye doctor.  A primary care provider.  A dentist.  Your health care providers follow a schedule in order to help you get the best quality of care. The following schedule is a general guideline for your diabetes management plan. Your health care providers may also give you more specific instructions. HbA1c ( hemoglobin A1c) test This test provides information about Briggs sugar (glucose) control over the previous 2-3 months. It is used to check whether your diabetes management plan needs to be adjusted.  If you are meeting your treatment goals, this test is done at least 2 times a year.  If you are not meeting treatment goals or if your treatment goals have changed, this test is done 4 times a  year.  Briggs pressure test  This  test is done at every routine medical visit. For most people, the goal is less than 130/80. Ask your health care provider what your goal Briggs pressure should be. Dental and eye exams  Visit your dentist two times a year.  If you have type 1 diabetes, get an eye exam 3-5 years after you are diagnosed, and then once a year after your first exam. ? If you were diagnosed with type 1 diabetes as a child, get an eye exam when you are age 86 or older and have had diabetes for 3-5 years. After the first exam, you should get an eye exam once a year.  If you have type 2 diabetes, have an eye exam as soon as you are diagnosed, and then once a year after your first exam. Foot care exam  Visual foot exams are done at every routine medical visit. The exams check for cuts, bruises, redness, blisters, sores, or other problems with the feet.  A complete foot exam is done by your health care provider once a year. This exam includes an inspection of the structure and skin of your feet, and a check of the pulses and sensation in your feet. ? Type 1 diabetes: Get your first exam 3-5 years after diagnosis. ? Type 2 diabetes: Get your first exam as soon as you are diagnosed.  Check your feet every day for cuts, bruises, redness, blisters, or sores. If you have any of these or other problems that are not healing, contact your health care provider. Kidney function test ( urine microalbumin)  This test is done once a year. ? Type 1 diabetes: Get your first test 5 years after diagnosis. ? Type 2 diabetes: Get your first test as soon as you are diagnosed.  If you have chronic kidney disease (CKD), get a serum creatinine and estimated glomerular filtration rate (eGFR) test once a year. Lipid profile (cholesterol, HDL, LDL, triglycerides)  This test should be done when you are diagnosed with diabetes, and every 5 years after the first test. If you are on medicines to lower your cholesterol, you may need to get this  test done every year. ? The goal for LDL is less than 100 mg/dL (5.5 mmol/L). If you are at high risk, the goal is less than 70 mg/dL (3.9 mmol/L). ? The goal for HDL is 40 mg/dL (2.2 mmol/L) for men and 50 mg/dL(2.8 mmol/L) for women. An HDL cholesterol of 60 mg/dL (3.3 mmol/L) or higher gives some protection against heart disease. ? The goal for triglycerides is less than 150 mg/dL (8.3 mmol/L). Immunizations  The yearly flu (influenza) vaccine is recommended for everyone 6 months or older who has diabetes.  The pneumonia (pneumococcal) vaccine is recommended for everyone 2 years or older who has diabetes. If you are 70 or older, you may get the pneumonia vaccine as a series of two separate shots.  The hepatitis B vaccine is recommended for adults shortly after they have been diagnosed with diabetes.  The Tdap (tetanus, diphtheria, and pertussis) vaccine should be given: ? According to normal childhood vaccination schedules, for children. ? Every 10 years, for adults who have diabetes.  The shingles vaccine is recommended for people who have had chicken pox and are 50 years or older. Mental and emotional health  Screening for symptoms of eating disorders, anxiety, and depression is recommended at the time of diagnosis and afterward as needed. If your screening shows that you have  symptoms (you have a positive screening result), you may need further evaluation and be referred to a mental health care provider. Diabetes self-management education  Education about how to manage your diabetes is recommended at diagnosis and ongoing as needed. Treatment plan  Your treatment plan will be reviewed at every medical visit. Summary  Managing diabetes (diabetes mellitus) can be complicated. Your diabetes treatment may be managed by a team of health care providers.  Your health care providers follow a schedule in order to help you get the best quality of care.  Standards of care including having  regular physical exams, Briggs tests, Briggs pressure monitoring, immunizations, screening tests, and education about how to manage your diabetes.  Your health care providers may also give you more specific instructions based on your individual health. This information is not intended to replace advice given to you by your health care provider. Make sure you discuss any questions you have with your health care provider. Document Released: 08/17/2009 Document Revised: 07/18/2016 Document Reviewed: 07/18/2016 Elsevier Interactive Patient Education  2018 [place] [employer]  Get these tests  Briggs pressure- Have your Briggs pressure checked once a year by your healthcare provider.  Normal Briggs pressure is 120/80  Weight- Have your body mass index (BMI) calculated to screen for obesity.  BMI is a measure of body fat based on height and weight. You can also calculate your own BMI at [employer].  Cholesterol- Have your cholesterol checked every year.  Diabetes- Have your Briggs sugar checked regularly if you have high Briggs pressure, high cholesterol, have a family history of diabetes or if you are overweight.  Screening for Colon Cancer- Colonoscopy starting at age 71.  Screening may begin sooner depending on your family history and other health conditions. Follow up colonoscopy as directed by your Gastroenterologist.  Screening for Prostate Cancer- Both Briggs work (PSA) and a rectal exam help screen for Prostate Cancer.  Screening begins at age 71 with African-American men and at age 46 with Caucasian men.  Screening may begin sooner depending on your family history.  Take these medicines  Aspirin- One aspirin daily can help prevent Heart disease and Stroke.  Flu shot- Every fall.  Tetanus- Every 10 years.  Zostavax- Once after the age of 63 to prevent Shingles.  Pneumonia shot- Once after the age of 66; if you are younger than 53, ask your healthcare provider  if you need a Pneumonia shot.  Take these steps  Don't smoke- If you do smoke, talk to your doctor about quitting.  For tips on how to quit, go to www.smokefree.gov or call 1-800-QUIT-NOW.  Be physically active- Exercise 5 days a week for at least 30 minutes.  If you are not already physically active start slow and gradually work up to 30 minutes of moderate physical activity.  Examples of moderate activity include walking briskly, mowing the yard, dancing, swimming, bicycling, etc.  Eat a healthy diet- Eat a variety of healthy food such as fruits, vegetables, low fat milk, low fat cheese, yogurt, lean meant, poultry, fish, beans, tofu, etc. For more information go to www.thenutritionsource.org  Drink alcohol in moderation- Limit alcohol intake to less than two drinks a day. Never drink and drive.  Dentist- Brush and floss twice daily; visit your dentist twice a year.  Depression- Your emotional health is as important as your physical health. If you're feeling down, or losing interest in things you would normally enjoy please talk to your healthcare provider.  Eye exam- Visit your eye doctor every year.  Safe sex- If you may be exposed to a sexually transmitted infection, use a condom.  Seat belts- Seat belts can save your life; always wear one.  Smoke/Carbon Monoxide detectors- These detectors need to be installed on the appropriate level of your home.  Replace batteries at least once a year.  Skin cancer- When out in the sun, cover up and use sunscreen 15 SPF or higher.  Violence- If anyone is threatening you, please tell your healthcare provider.  Living Will/ Health care power of attorney- Speak with your healthcare provider and family.   IF you received an x-Briggs today, you will receive an invoice from [facility]. Please contact [facility] at [phone] with questions or concerns regarding your invoice.   IF you received labwork today, you will receive an  invoice from [place]. Please contact [facility] at [phone] with questions or concerns regarding your invoice.   Our billing staff will not be able to assist you with questions regarding bills from these companies.  You will be contacted with the lab results as soon as they are available. The fastest way to get your results is to activate your My Chart account. Instructions are located on the last page of this paperwork. If you have not heard from [place] regarding the results in 2 weeks, please contact this office.       I personally performed the services described in this documentation, which was scribed in my presence. The recorded information has been reviewed and considered for accuracy and completeness, addended by me as needed, and agree with information above.  Signed,   Edward Ray, MD [facility] [place].  04/23/17 3:46 PM

## 2017-04-24 LAB — COMPREHENSIVE METABOLIC PANEL
ALBUMIN: 4.6 g/dL (ref 3.6–4.8)
ALK PHOS: 67 IU/L (ref 39–117)
ALT: 17 IU/L (ref 0–44)
AST: 19 IU/L (ref 0–40)
Albumin/Globulin Ratio: 1.5 (ref 1.2–2.2)
BUN/Creatinine Ratio: 12 (ref 10–24)
BUN: 11 mg/dL (ref 8–27)
Bilirubin Total: 0.6 mg/dL (ref 0.0–1.2)
CO2: 24 mmol/L (ref 20–29)
CREATININE: 0.89 mg/dL (ref 0.76–1.27)
Calcium: 9.4 mg/dL (ref 8.6–10.2)
Chloride: 104 mmol/L (ref 96–106)
GFR, EST AFRICAN AMERICAN: 104 mL/min/{1.73_m2} (ref >59)
GFR, EST NON AFRICAN AMERICAN: 90 mL/min/{1.73_m2} (ref >59)
GLOBULIN, TOTAL: 3 g/dL (ref 1.5–4.5)
GLUCOSE: 143 mg/dL — AB (ref 65–99)
Potassium: 5 mmol/L (ref 3.5–5.2)
Sodium: 143 mmol/L (ref 134–144)
TOTAL PROTEIN: 7.6 g/dL (ref 6.0–8.5)

## 2017-04-24 LAB — HEMOGLOBIN A1C
ESTIMATED AVERAGE GLUCOSE: 183 mg/dL
HEMOGLOBIN A1C: 8 % — AB (ref 4.8–5.6)

## 2017-04-24 LAB — LIPID PANEL
CHOL/HDL RATIO: 3.2 ratio (ref 0.0–5.0)
Cholesterol, Total: 152 mg/dL (ref 100–199)
HDL: 47 mg/dL (ref >39)
LDL CALC: 84 mg/dL (ref 0–99)
Triglycerides: 104 mg/dL (ref 0–149)
VLDL Cholesterol Cal: 21 mg/dL (ref 5–40)

## 2017-04-24 LAB — MICROALBUMIN, URINE: Microalbumin, Urine: 6.5 ug/mL

## 2017-04-24 LAB — PSA: Prostate Specific Ag, Serum: 0.3 ng/mL (ref 0.0–4.0)

## 2017-05-04 ENCOUNTER — Other Ambulatory Visit: Payer: Self-pay | Admitting: Family Medicine

## 2017-05-04 MED ORDER — METFORMIN HCL 500 MG PO TABS: ORAL_TABLET | ORAL | 0 refills | Status: DC

## 2017-05-05 ENCOUNTER — Ambulatory Visit (INDEPENDENT_AMBULATORY_CARE_PROVIDER_SITE_OTHER): Payer: BC Managed Care – PPO | Admitting: Podiatry

## 2017-05-05 ENCOUNTER — Encounter: Payer: Self-pay | Admitting: Podiatry

## 2017-05-05 VITALS — BP 149/85 | HR 85 | Ht 71.0 in | Wt 260.0 lb

## 2017-05-05 DIAGNOSIS — M79671 Pain in right foot: Secondary | ICD-10-CM | POA: Diagnosis not present

## 2017-05-05 DIAGNOSIS — M79672 Pain in left foot: Secondary | ICD-10-CM | POA: Diagnosis not present

## 2017-05-05 DIAGNOSIS — B351 Tinea unguium: Secondary | ICD-10-CM | POA: Diagnosis not present

## 2017-05-05 DIAGNOSIS — L600 Ingrowing nail: Secondary | ICD-10-CM | POA: Diagnosis not present

## 2017-05-05 DIAGNOSIS — E0800 Diabetes mellitus due to underlying condition with hyperosmolarity without nonketotic hyperglycemic-hyperosmolar coma (NKHHC): Secondary | ICD-10-CM

## 2017-05-05 NOTE — Patient Instructions (Addendum)
Seen for hypertrophic nails. All nails debrided. Return in 3 months or as needed.  

## 2017-05-05 NOTE — Progress Notes (Signed)
  66 y.o. year old male presents requesting diabetic foot care. Referred by Dr. Nyoka Cowden for diabetic foot check. Been diabetic for 2-3 years. Most recent blood glucose level was 165.  HPI: History of broken ankle and surgery left 3-4 years ago. History of lawnmower accident on left foot injured over the first MPJ in 1969.  REVIEW OF SYSTEMS: Pertinent items noted in HPI and remainder of comprehensive ROS otherwise negative.  OBJECTIVE: DERMATOLOGIC EXAMINATION: Nails: Thick dystrophic nails x 10.  VASCULAR EXAMINATION OF LOWER LIMBS: All pedal pulses are palpable with normal pulsation.  Capillary Filling times within 3 seconds in all digits.  No edema or erythema noted. Temperature gradient from tibial crest to dorsum of foot is within normal bilateral.  NEUROLOGIC EXAMINATION OF THE LOWER LIMBS: Achilles DTR is present and within normal. Monofilament (Semmes-Weinstein 10-gm) sensory testing positive 6 out of 6, bilateral. Vibratory sensations(128Hz  turning fork) intact at medial and lateral forefoot bilateral.  Sharp and Dull discriminatory sensations at the plantar ball of hallux is intact bilateral.   MUSCULOSKELETAL EXAMINATION: No gross deformities noted.  ASSESSMENT: Onychomycosis x 10. Painful feet.  PLAN: Reviewed clinical findings and available treatment options. All nails debrided. Return in 3 months.

## 2017-05-17 ENCOUNTER — Other Ambulatory Visit: Payer: Self-pay | Admitting: Physician Assistant

## 2017-05-17 DIAGNOSIS — I10 Essential (primary) hypertension: Secondary | ICD-10-CM

## 2017-05-18 ENCOUNTER — Encounter: Payer: Self-pay | Admitting: Family Medicine

## 2017-06-16 ENCOUNTER — Ambulatory Visit: Payer: BC Managed Care – PPO | Admitting: Family Medicine

## 2017-06-27 ENCOUNTER — Other Ambulatory Visit: Payer: Self-pay | Admitting: Family Medicine

## 2017-06-27 DIAGNOSIS — I10 Essential (primary) hypertension: Secondary | ICD-10-CM

## 2017-07-07 DIAGNOSIS — N402 Nodular prostate without lower urinary tract symptoms: Secondary | ICD-10-CM | POA: Diagnosis not present

## 2017-08-05 ENCOUNTER — Ambulatory Visit: Payer: BC Managed Care – PPO | Admitting: Podiatry

## 2017-08-05 ENCOUNTER — Other Ambulatory Visit: Payer: Self-pay | Admitting: Family Medicine

## 2017-08-06 NOTE — Telephone Encounter (Signed)
NO ANSWER ON CELL PHONE VOICE MAIL BOX WAS FULL LMOM ON HOME VOICE MAIL FOR PT TO CALL AN SCHEDULE AN OV WITH [facility] FOR MORE MED REFILL

## 2017-08-19 ENCOUNTER — Encounter: Payer: Self-pay | Admitting: Podiatry

## 2017-08-19 ENCOUNTER — Ambulatory Visit (INDEPENDENT_AMBULATORY_CARE_PROVIDER_SITE_OTHER): Payer: BC Managed Care – PPO | Admitting: Podiatry

## 2017-08-19 DIAGNOSIS — B351 Tinea unguium: Secondary | ICD-10-CM

## 2017-08-19 DIAGNOSIS — L600 Ingrowing nail: Secondary | ICD-10-CM

## 2017-08-19 NOTE — Patient Instructions (Signed)
Seen for hypertrophic nails. Both big toe nails are ingrown. All nails debrided. Bleeding nail cleansed with iodine and dressing applied. Remove the band aid tomorrow. Return in 3 months for routine foot care.

## 2017-08-19 NOTE — Progress Notes (Signed)
Subjective: 66 y.o. year old male patient presents complaining of painful nails. Patient requests toe nails, corns and calluses trimmed.  Blood sugar reading was 135 yesterday.  Hx of injured left great toe joint from lawnmower accident many years ago, 1969. Hx of broken ankle and surgery 3-4 years ago left.  Objective: Dermatologic: Thick yellow deformed nails x 10. Both great toe nails are ingrown. Vascular: Pedal pulses are all palpable. Orthopedic: Contracted lesser digits  Neurologic: All epicritic and tactile sensations grossly intact.  Assessment: Dystrophic mycotic nails x 10. Painful nails.  Treatment: All mycotic nails debrided.  Bleeding left great toe nail cleansed with Iodine and dressing applied. Return in 3 months for routine foot care.

## 2017-09-09 ENCOUNTER — Other Ambulatory Visit: Payer: Self-pay | Admitting: Family Medicine

## 2017-09-09 DIAGNOSIS — I10 Essential (primary) hypertension: Secondary | ICD-10-CM

## 2017-11-17 ENCOUNTER — Encounter: Payer: BC Managed Care – PPO | Admitting: Family Medicine

## 2017-11-19 ENCOUNTER — Ambulatory Visit: Payer: BC Managed Care – PPO | Admitting: Podiatry

## 2017-11-27 ENCOUNTER — Ambulatory Visit (INDEPENDENT_AMBULATORY_CARE_PROVIDER_SITE_OTHER): Payer: Medicare Other | Admitting: Family Medicine

## 2017-11-27 ENCOUNTER — Encounter: Payer: Self-pay | Admitting: Family Medicine

## 2017-11-27 VITALS — BP 130/80 | HR 87 | Temp 98.4°F | Resp 18 | Ht 72.5 in | Wt 261.0 lb

## 2017-11-27 DIAGNOSIS — E119 Type 2 diabetes mellitus without complications: Secondary | ICD-10-CM | POA: Diagnosis not present

## 2017-11-27 DIAGNOSIS — Z13220 Encounter for screening for lipoid disorders: Secondary | ICD-10-CM | POA: Diagnosis not present

## 2017-11-27 DIAGNOSIS — I10 Essential (primary) hypertension: Secondary | ICD-10-CM | POA: Diagnosis not present

## 2017-11-27 LAB — LIPID PANEL
CHOL/HDL RATIO: 2.9 ratio (ref 0.0–5.0)
Cholesterol, Total: 136 mg/dL (ref 100–199)
HDL: 47 mg/dL (ref >39)
LDL Calculated: 66 mg/dL (ref 0–99)
TRIGLYCERIDES: 113 mg/dL (ref 0–149)
VLDL Cholesterol Cal: 23 mg/dL (ref 5–40)

## 2017-11-27 LAB — HEMOGLOBIN A1C
ESTIMATED AVERAGE GLUCOSE: 212 mg/dL
HEMOGLOBIN A1C: 9 % — AB (ref 4.8–5.6)

## 2017-11-27 LAB — COMPREHENSIVE METABOLIC PANEL
A/G RATIO: 1.5 (ref 1.2–2.2)
ALT: 18 IU/L (ref 0–44)
AST: 17 IU/L (ref 0–40)
Albumin: 4.4 g/dL (ref 3.6–4.8)
Alkaline Phosphatase: 67 IU/L (ref 39–117)
BUN / CREAT RATIO: 10 (ref 10–24)
BUN: 9 mg/dL (ref 8–27)
Bilirubin Total: 0.6 mg/dL (ref 0.0–1.2)
CALCIUM: 10 mg/dL (ref 8.6–10.2)
CO2: 21 mmol/L (ref 20–29)
Chloride: 101 mmol/L (ref 96–106)
Creatinine, Ser: 0.89 mg/dL (ref 0.76–1.27)
GFR calc Af Amer: 103 mL/min/{1.73_m2} (ref >59)
GFR, EST NON AFRICAN AMERICAN: 89 mL/min/{1.73_m2} (ref >59)
GLOBULIN, TOTAL: 2.9 g/dL (ref 1.5–4.5)
Glucose: 194 mg/dL — ABNORMAL HIGH (ref 65–99)
POTASSIUM: 4.6 mmol/L (ref 3.5–5.2)
SODIUM: 139 mmol/L (ref 134–144)
Total Protein: 7.3 g/dL (ref 6.0–8.5)

## 2017-11-27 MED ORDER — LISINOPRIL 5 MG PO TABS: 5.0000 mg | ORAL_TABLET | Freq: Every day | ORAL | 1 refills | Status: DC

## 2017-11-27 NOTE — Patient Instructions (Addendum)
Thank you for coming in today. No change in medications for now. Would consider a statin medication and dose would depend on your cholesterol levels with history of diabetes. Recheck in 3 months.  IF you received an x-ray today, you will receive an invoice from [facility]. Please contact [facility] at [phone] with questions or concerns regarding your invoice.   IF you received labwork today, you will receive an invoice from [place]. Please contact [facility] at [phone] with questions or concerns regarding your invoice.   Our billing staff will not be able to assist you with questions regarding bills from these companies.  You will be contacted with the lab results as soon as they are available. The fastest way to get your results is to activate your My Chart account. Instructions are located on the last page of this paperwork. If you have not heard from [place] regarding the results in 2 weeks, please contact this office.

## 2017-11-27 NOTE — Progress Notes (Signed)
Subjective:    Patient ID: Edward Briggs, male    DOB: 1951/04/04, 67 y.o.   MRN: [id number]  HPI Edward Briggs is a 67 y.o. male Presents today for: Chief Complaint  Patient presents with  . Follow-up  . Diabetes  Here for follow-up of multiple medical problems and meds.   Diabetes: Last OV June 2018. Planned on 3 month follow up.  Lab Results  Component Value Date   HGBA1C 8.0 (H) 04/23/2017  planned to increase metformin to 523m BID - has been taking it twice per day.  Urine microalbumin 6.07 April 2017.  Last optho - unknown. Will refer today.  Refuses flu vaccine.   Had prevnar June 2018, plans on pneumovax in June this year On lisinopril.  On ASA 818mQD.   Hypertension Lab Results  Component Value Date   CREATININE 0.89 04/23/2017  lisinopril QD  - no new side effects or missed doses.   Lipid screening. Fasting this am.  Lab Results  Component Value Date   CHOL 152 04/23/2017   HDL 47 04/23/2017   LDLCALC 84 04/23/2017   TRIG 104 04/23/2017   CHOLHDL 3.2 04/23/2017   Lab Results  Component Value Date   ALT 17 04/23/2017   AST 19 04/23/2017   ALKPHOS 67 04/23/2017   BILITOT 0.6 04/23/2017   Health maintenance: Plans on colonoscopy in March Refuses flu vaccine.    Patient Active Problem List   Diagnosis Date Noted  . Type II or unspecified type diabetes mellitus without mention of complication, uncontrolled 09/10/2012  . GASTROESOPHAGEAL REFLUX DISEASE 07/21/2009  . CHEST PAIN 07/21/2009   Past Medical History:  Diagnosis Date  . Arthritis   . Diabetes [place] without complication [place]  . GERD (gastroesophageal reflux disease)    no meds   Past Surgical History:  Procedure Laterality Date  . ABSCESS DRAINAGE    . FRACTURE SURGERY    . HARDWARE REMOVAL  03/26/2012   Procedure: HARDWARE REMOVAL;  Surgeon: PaColin RheinMD;  [place];  Laterality: Left;  hardware removal deep left ankle  syndesmotic screws only and stress xrays ankle   . ORIF ANKLE FRACTURE  12/03/2011   Procedure: OPEN REDUCTION INTERNAL FIXATION (ORIF) ANKLE FRACTURE;  Surgeon: PaColin RheinMD;  [place] [facility] Service: [place]ics;  Laterality: Left;  left medial malleolus fracture  . PILONIDAL CYST EXCISION  ~1982  . SKIN GRAFT  ~1970   Lt great toe (graft from Lt thigh)   No Known Allergies Prior to Admission medications   Medication Sig Start Date End Date Taking? Authorizing Provider  Ascorbic Acid (VITAMIN C PO) Take by mouth.   Yes [provider]  aspirin 81 MG tablet Take 81 mg by mouth daily.   Yes [provider]  Blood Glucose Monitoring Suppl (BLOOD GLUCOSE METER) kit Use as instructed 09/10/12  Yes HoPosey BoyerMD  Guaifenesin ([facility]wayAXIMUM STRENGTH) 1200 MG TB12 Take 1 tablet (1,200 mg total) by mouth every 12 (twelve) hours as needed. 12/27/15  Yes ShShawnee KnappMD  lisinopril (PRINIVIL,ZESTRIL) 5 MG tablet TAKE 1 TABLET (5 MG TOTAL) BY MOUTH DAILY. PATIENT NEEDS OFFICE VISIT WITH NEW PCP BEFORE REFILLS 09/10/17  Yes GrWendie AgresteMD  metFORMIN (GLUCOPHAGE) 500 MG tablet TAKE 1 TABLET BY MOUTH TWICE A DAY WITH A MEAL  Office visit needed 09/10/17  Yes GrWendie AgresteMD   Social  History   Socioeconomic History  . Marital status: Married    Spouse name: Not on file  . Number of children: Not on file  . Years of education: Not on file  . Highest education level: Not on file  Social Needs  . Financial resource strain: Not on file  . Food insecurity - worry: Not on file  . Food insecurity - inability: Not on file  . Transportation needs - medical: Not on file  . Transportation needs - non-medical: Not on file  Occupational History  . Not on file  Tobacco Use  . Smoking status: Former Smoker    Last attempt to quit: 10/30/1988    Years since quitting: 29.0  . Smokeless tobacco: Never Used  Substance and Sexual Activity  . Alcohol  use: Yes    Alcohol/week: 0.6 oz    Types: 1 Shots of liquor per week    Comment: every two weeks  . Drug use: No  . Sexual activity: Yes    Birth control/protection: None  Other Topics Concern  . Not on file  Social History Narrative  . Not on file    Review of Systems  Constitutional: Negative for fatigue and unexpected weight change.  Eyes: Negative for visual disturbance.  Respiratory: Negative for cough, chest tightness and shortness of breath.   Cardiovascular: Negative for chest pain, palpitations and leg swelling.  Gastrointestinal: Negative for abdominal pain and blood in stool.  Neurological: Negative for dizziness, light-headedness and headaches.       Objective:   Physical Exam  Constitutional: He is oriented to person, place, and time. He appears well-developed and well-nourished.  HENT:  Head: Normocephalic and atraumatic.  Eyes: EOM are normal. Pupils are equal, round, and reactive to light.  Neck: No JVD present. Carotid bruit is not present.  Cardiovascular: Normal rate, regular rhythm and normal heart sounds.  No murmur heard. Pulmonary/Chest: Effort normal and breath sounds normal. He has no rales.  Musculoskeletal: He exhibits no edema.  Neurological: He is alert and oriented to person, place, and time.  Skin: Skin is warm and dry.  Psychiatric: He has a normal mood and affect.  Vitals reviewed.  Vitals:   11/27/17 1058  BP: 130/80  Pulse: 87  Resp: 18  Temp: 98.4 F (36.9 C)  TempSrc: Oral  SpO2: 98%  Weight: 261 lb (118.4 kg)  Height: 6' 0.5" (1.842 m)      Assessment & Plan:   Edward Briggs is a 67 y.o. male Type 2 diabetes mellitus without complication, without long-term current use of insulin ([place]) - Plan: Ambulatory referral to Ophthalmology, Comprehensive metabolic panel, Lipid panel, Hemoglobin A1c  - Referred also, check A1c, no change in medications for now. Stressed importance of follow-up A1c every 3 months unless controlled  then space to 6 months  Essential hypertension - Plan: Comprehensive metabolic panel, lisinopril (PRINIVIL,ZESTRIL) 5 MG tablet  -Borderline, controlled. Continue same dose lisinopril. Labs pending  Screening for hyperlipidemia - Plan: Lipid panel  -Check lipids, consider statin with diabetes.  Meds ordered this encounter  Medications  . lisinopril (PRINIVIL,ZESTRIL) 5 MG tablet    Sig: Take 1 tablet (5 mg total) by mouth daily.    Dispense:  90 tablet    Refill:  1   Patient Instructions   Thank you for coming in today. No change in medications for now. Would consider a statin medication and dose would depend on your cholesterol levels with history of diabetes. Recheck in 3  months.  IF you received an x-ray today, you will receive an invoice from [facility]. Please contact [facility] at [phone] with questions or concerns regarding your invoice.   IF you received labwork today, you will receive an invoice from [place]. Please contact [facility] at [phone] with questions or concerns regarding your invoice.   Our billing staff will not be able to assist you with questions regarding bills from these companies.  You will be contacted with the lab results as soon as they are available. The fastest way to get your results is to activate your My Chart account. Instructions are located on the last page of this paperwork. If you have not heard from [place] regarding the results in 2 weeks, please contact this office.       Signed,   [name], MD [facility] [place].  11/28/17 11:27 AM

## 2017-11-28 ENCOUNTER — Encounter: Payer: Self-pay | Admitting: Family Medicine

## 2017-12-15 ENCOUNTER — Ambulatory Visit: Payer: BC Managed Care – PPO | Admitting: Family Medicine

## 2017-12-17 ENCOUNTER — Ambulatory Visit: Payer: BC Managed Care – PPO | Admitting: Podiatry

## 2018-01-12 NOTE — Progress Notes (Deleted)
[facility] Note  01/13/2018     CHIEF COMPLAINT Patient presents for No chief complaint on file.   HISTORY OF PRESENT ILLNESS: Edward Briggs is a 67 y.o. male who presents to the clinic today for:     Referring physician: [name], MD [place], [place] [address]  HISTORICAL INFORMATION:   Selected notes from the MEDICAL RECORD NUMBER Referred by Dr. Jacklyn Shell for DM exam;   LEE-  Ocular Hx-  PMH- DM (A1C - 9.0 - 1 month ago), arthritis, GERD    CURRENT MEDICATIONS: No current outpatient medications on file. (Ophthalmic Drugs)   No current facility-administered medications for this visit.  (Ophthalmic Drugs)   Current Outpatient Medications (Other)  Medication Sig   Ascorbic Acid (VITAMIN C PO) Take by mouth.   aspirin 81 MG tablet Take 81 mg by mouth daily.   Blood Glucose Monitoring Suppl (BLOOD GLUCOSE METER) kit Use as instructed   Guaifenesin (MUCINEX MAXIMUM STRENGTH) 1200 MG TB12 Take 1 tablet (1,200 mg total) by mouth every 12 (twelve) hours as needed.   lisinopril (PRINIVIL,ZESTRIL) 5 MG tablet Take 1 tablet (5 mg total) by mouth daily.   metFORMIN (GLUCOPHAGE) 500 MG tablet TAKE 1 TABLET BY MOUTH TWICE A DAY WITH A MEAL  Office visit needed   No current facility-administered medications for this visit.  (Other)      REVIEW OF SYSTEMS:    ALLERGIES No Known Allergies  PAST MEDICAL HISTORY Past Medical History:  Diagnosis Date   Arthritis    Diabetes mellitus without complication ([place])    GERD (gastroesophageal reflux disease)    no meds   Past Surgical History:  Procedure Laterality Date   ABSCESS DRAINAGE     FRACTURE SURGERY     HARDWARE REMOVAL  03/26/2012   Procedure: HARDWARE REMOVAL;  Surgeon: [name], MD;  Location: [place];  Service: Orthopedics;  Laterality: Left;  hardware removal deep left ankle syndesmotic screws only and stress xrays ankle     ORIF ANKLE FRACTURE  12/03/2011   Procedure: OPEN REDUCTION INTERNAL FIXATION (ORIF) ANKLE FRACTURE;  Surgeon: [name], MD;  Location: [place];  Service: Orthopedics;  Laterality: Left;  left medial malleolus fracture   PILONIDAL CYST EXCISION  ~1982   SKIN GRAFT  ~1970   Lt great toe (graft from Lt thigh)    FAMILY HISTORY Family History  Problem Relation Age of Onset   Cancer Mother    Cancer Father     SOCIAL HISTORY Social History   Tobacco Use   Smoking status: Former Smoker    Last attempt to quit: 10/30/1988    Years since quitting: 29.2   Smokeless tobacco: Never Used  Substance Use Topics   Alcohol use: Yes    Alcohol/week: 0.6 oz    Types: 1 Shots of liquor per week    Comment: every two weeks   Drug use: No         OPHTHALMIC EXAM:  Not recorded      IMAGING AND PROCEDURES  Imaging and Procedures for 01/12/18           ASSESSMENT/PLAN:    ICD-10-CM   1. 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  2.  3.  Ophthalmic Meds Ordered this visit:  No orders of the defined types were placed in this encounter.  No Follow-up on file.  There are no Patient Instructions on file for this visit.   Explained the diagnoses, plan, and follow up with the patient and they expressed understanding.  Patient expressed understanding of the importance of proper follow up care.   This document serves as a record of services personally performed by Gardiner Sleeper, MD, PhD. It was created on their behalf by Catha Brow, [place], a certified ophthalmic assistant. The creation of this record is the provider's dictation and/or activities during the visit.  Electronically signed by: Catha Brow, [place]  01/12/18 11:55 AM   Gardiner Sleeper, M.D., Ph.D. Diseases & Surgery of the Retina and [place] 01/12/18     Abbreviations: M myopia (nearsighted); A astigmatism; H  hyperopia (farsighted); P presbyopia; Mrx spectacle prescription;  CTL contact lenses; OD right eye; OS left eye; OU both eyes  XT exotropia; ET esotropia; PEK punctate epithelial keratitis; PEE punctate epithelial erosions; DES dry eye syndrome; MGD meibomian gland dysfunction; ATs artificial tears; PFAT's preservative free artificial tears; [place] nuclear sclerotic cataract; PSC posterior subcapsular cataract; ERM epi-retinal membrane; PVD posterior vitreous detachment; RD retinal detachment; DM diabetes mellitus; DR diabetic retinopathy; NPDR non-proliferative diabetic retinopathy; PDR proliferative diabetic retinopathy; CSME clinically significant macular edema; DME diabetic macular edema; dbh dot blot hemorrhages; CWS cotton wool spot; POAG primary open angle glaucoma; C/D cup-to-disc ratio; HVF humphrey visual field; GVF goldmann visual field; OCT optical coherence tomography; IOP intraocular pressure; BRVO Branch retinal vein occlusion; CRVO central retinal vein occlusion; CRAO central retinal artery occlusion; BRAO branch retinal artery occlusion; RT retinal tear; SB scleral buckle; PPV pars plana vitrectomy; VH Vitreous hemorrhage; PRP panretinal laser photocoagulation; IVK intravitreal kenalog; VMT vitreomacular traction; MH Macular hole;  NVD neovascularization of the disc; NVE neovascularization elsewhere; AREDS age related eye disease study; ARMD age related macular degeneration; POAG primary open angle glaucoma; EBMD epithelial/anterior basement membrane dystrophy; ACIOL anterior chamber intraocular lens; IOL intraocular lens; PCIOL posterior chamber intraocular lens; Phaco/IOL phacoemulsification with intraocular lens placement; [place] photorefractive keratectomy; LASIK laser assisted in situ keratomileusis; HTN hypertension; DM diabetes mellitus; COPD chronic obstructive pulmonary disease

## 2018-01-13 ENCOUNTER — Encounter (INDEPENDENT_AMBULATORY_CARE_PROVIDER_SITE_OTHER): Payer: BC Managed Care – PPO | Admitting: Ophthalmology

## 2018-02-01 ENCOUNTER — Encounter: Payer: Self-pay | Admitting: Physician Assistant

## 2018-02-11 ENCOUNTER — Encounter: Payer: Self-pay | Admitting: Podiatry

## 2018-02-11 ENCOUNTER — Ambulatory Visit (INDEPENDENT_AMBULATORY_CARE_PROVIDER_SITE_OTHER): Payer: Medicare Other | Admitting: Podiatry

## 2018-02-11 DIAGNOSIS — B351 Tinea unguium: Secondary | ICD-10-CM | POA: Diagnosis not present

## 2018-02-11 DIAGNOSIS — M79672 Pain in left foot: Secondary | ICD-10-CM | POA: Diagnosis not present

## 2018-02-11 DIAGNOSIS — M79671 Pain in right foot: Secondary | ICD-10-CM | POA: Diagnosis not present

## 2018-02-11 DIAGNOSIS — L600 Ingrowing nail: Secondary | ICD-10-CM

## 2018-02-11 NOTE — Progress Notes (Signed)
Subjective: 67 y.o. year old male patient presents complaining of painful nails. Patient requests toe nails, corns and calluses trimmed.  Las blood sugar was 3 days ago and was 158.  Objective: Dermatologic: Thick yellow deformed nails x 10. Ingrown hallucal nail both great toes. Vascular: Pedal pulses are all palpable. Orthopedic: History of foot surgery and multiple scar over first ray bilateral. Neurologic: All epicritic and tactile sensations grossly intact.  Assessment: Dystrophic mycotic nails x 10. Ingrown hallucal nails. Painful feet.  Treatment: All mycotic nails debrided.  Return in 3 months or as needed.

## 2018-02-11 NOTE — Patient Instructions (Signed)
Seen for hypertrophic nails. All nails debrided. Return in 3 months or as needed.  

## 2018-05-13 ENCOUNTER — Ambulatory Visit: Payer: Medicare Other | Admitting: Podiatry

## 2018-05-20 ENCOUNTER — Encounter: Payer: Self-pay | Admitting: Podiatry

## 2018-05-20 ENCOUNTER — Ambulatory Visit (INDEPENDENT_AMBULATORY_CARE_PROVIDER_SITE_OTHER): Payer: Medicare Other | Admitting: Podiatry

## 2018-05-20 DIAGNOSIS — L600 Ingrowing nail: Secondary | ICD-10-CM

## 2018-05-20 DIAGNOSIS — B351 Tinea unguium: Secondary | ICD-10-CM | POA: Diagnosis not present

## 2018-05-20 DIAGNOSIS — E0800 Diabetes mellitus due to underlying condition with hyperosmolarity without nonketotic hyperglycemic-hyperosmolar coma (NKHHC): Secondary | ICD-10-CM

## 2018-05-20 NOTE — Progress Notes (Signed)
Subjective: 67 y.o. year old male patient presents for diabetic foot care for painful nails.  He does not remember his blood sugar level he checked 2 days ago.  Objective: Dermatologic: Thick yellow deformed nails x 10. Ingrown hallucal nails at medial border without infection bilateral. Vascular: Pedal pulses are all palpable. Orthopedic: Contracted lesser digits bilateral. Neurologic: All epicritic and tactile sensations grossly intact.  Assessment: Dystrophic mycotic nails x 10. Ingrown hallucal nails. Painful nails. Diabetic uncontrolled.  Treatment: All mycotic nails debrided.  Return in 3 months or as needed.

## 2018-05-20 NOTE — Patient Instructions (Signed)
Seen for hypertrophic nails. All nails debrided. Return in 3 months or as needed.  

## 2018-05-27 ENCOUNTER — Other Ambulatory Visit: Payer: Self-pay | Admitting: Family Medicine

## 2018-05-27 DIAGNOSIS — I10 Essential (primary) hypertension: Secondary | ICD-10-CM

## 2018-05-30 NOTE — Telephone Encounter (Signed)
LOV 11/27/17 with Dr. [name] / Refill reqeust for lisinopril / Last filled: lisinopril (PRINIVIL,ZESTRIL) 5 MG tablet 90 tablet 1 11/27/2017    Sig - Route: Take 1 tablet (5 mg total) by mouth daily. - Oral   Sent to pharmacy as: lisinopril (PRINIVIL,ZESTRIL) 5 MG tablet    / Per last note, patient should follow up in 3 months.  I do not see where patient followed up.  Will send to provider for review

## 2018-07-13 ENCOUNTER — Ambulatory Visit (INDEPENDENT_AMBULATORY_CARE_PROVIDER_SITE_OTHER): Payer: Medicare Other | Admitting: Family Medicine

## 2018-07-13 ENCOUNTER — Encounter: Payer: Self-pay | Admitting: Family Medicine

## 2018-07-13 ENCOUNTER — Other Ambulatory Visit: Payer: Self-pay

## 2018-07-13 VITALS — BP 137/88 | HR 84 | Temp 98.4°F | Ht 73.0 in | Wt 257.8 lb

## 2018-07-13 DIAGNOSIS — J22 Unspecified acute lower respiratory infection: Secondary | ICD-10-CM | POA: Diagnosis not present

## 2018-07-13 DIAGNOSIS — R059 Cough, unspecified: Secondary | ICD-10-CM

## 2018-07-13 DIAGNOSIS — R05 Cough: Secondary | ICD-10-CM

## 2018-07-13 MED ORDER — AZITHROMYCIN 250 MG PO TABS: ORAL_TABLET | ORAL | 0 refills | Status: DC

## 2018-07-13 NOTE — Patient Instructions (Addendum)
Start antibiotic for possible bronchitis or chest congestion.  Mucinex during the day for cough as needed. If cough is not improving within the next week to 10 days or any worsening sooner, please return for recheck and possible x-rays.  Please follow-up in the next few weeks to review other chronic medical concerns/medications.  Thank you for coming in today.   Acute Bronchitis, Adult Acute bronchitis is sudden (acute) swelling of the air tubes (bronchi) in the lungs. Acute bronchitis causes these tubes to fill with mucus, which can make it hard to breathe. It can also cause coughing or wheezing. In adults, acute bronchitis usually goes away within 2 weeks. A cough caused by bronchitis may last up to 3 weeks. Smoking, allergies, and asthma can make the condition worse. Repeated episodes of bronchitis may cause further lung problems, such as chronic obstructive pulmonary disease (COPD). What are the causes? This condition can be caused by germs and by substances that irritate the lungs, including:  Cold and flu viruses. This condition is most often caused by the same virus that causes a cold.  Bacteria.  Exposure to tobacco smoke, dust, fumes, and air pollution.  What increases the risk? This condition is more likely to develop in people who:  Have close contact with someone with acute bronchitis.  Are exposed to lung irritants, such as tobacco smoke, dust, fumes, and vapors.  Have a weak immune system.  Have a respiratory condition such as asthma.  What are the signs or symptoms? Symptoms of this condition include:  A cough.  Coughing up clear, yellow, or green mucus.  Wheezing.  Chest congestion.  Shortness of breath.  A fever.  Body aches.  Chills.  A sore throat.  How is this diagnosed? This condition is usually diagnosed with a physical exam. During the exam, your health care provider may order tests, such as chest X-rays, to rule out other conditions. He or she  may also:  Test a sample of your mucus for bacterial infection.  Check the level of oxygen in your blood. This is done to check for pneumonia.  Do a chest X-ray or lung function testing to rule out pneumonia and other conditions.  Perform blood tests.  Your health care provider will also ask about your symptoms and medical history. How is this treated? Most cases of acute bronchitis clear up over time without treatment. Your health care provider may recommend:  Drinking more fluids. Drinking more makes your mucus thinner, which may make it easier to breathe.  Taking a medicine for a fever or cough.  Taking an antibiotic medicine.  Using an inhaler to help improve shortness of breath and to control a cough.  Using a cool mist vaporizer or humidifier to make it easier to breathe.  Follow these instructions at home: Medicines  Take over-the-counter and prescription medicines only as told by your health care provider.  If you were prescribed an antibiotic, take it as told by your health care provider. Do not stop taking the antibiotic even if you start to feel better. General instructions  Get plenty of rest.  Drink enough fluids to keep your urine clear or pale yellow.  Avoid smoking and secondhand smoke. Exposure to cigarette smoke or irritating chemicals will make bronchitis worse. If you smoke and you need help quitting, ask your health care provider. Quitting smoking will help your lungs heal faster.  Use an inhaler, cool mist vaporizer, or humidifier as told by your health care provider.  Keep  all follow-up visits as told by your health care provider. This is important. How is this prevented? To lower your risk of getting this condition again:  Wash your hands often with soap and water. If soap and water are not available, use hand sanitizer.  Avoid contact with people who have cold symptoms.  Try not to touch your hands to your mouth, nose, or eyes.  Make sure to  get the flu shot every year.  Contact a health care provider if:  Your symptoms do not improve in 2 weeks of treatment. Get help right away if:  You cough up blood.  You have chest pain.  You have severe shortness of breath.  You become dehydrated.  You faint or keep feeling like you are going to faint.  You keep vomiting.  You have a severe headache.  Your fever or chills gets worse. This information is not intended to replace advice given to you by your health care provider. Make sure you discuss any questions you have with your health care provider. Document Released: 11/27/2004 Document Revised: 05/14/2016 Document Reviewed: 04/09/2016 Elsevier Interactive Patient Education  2018 [place].    Cough, Adult Coughing is a reflex that clears your throat and your airways. Coughing helps to heal and protect your lungs. It is normal to cough occasionally, but a cough that happens with other symptoms or lasts a long time may be a sign of a condition that needs treatment. A cough may last only 2-3 weeks (acute), or it may last longer than 8 weeks (chronic). What are the causes? Coughing is commonly caused by:  Breathing in substances that irritate your lungs.  A viral or bacterial respiratory infection.  Allergies.  Asthma.  Postnasal drip.  Smoking.  Acid backing up from the stomach into the esophagus (gastroesophageal reflux).  Certain medicines.  Chronic lung problems, including COPD (or rarely, lung cancer).  Other medical conditions such as heart failure.  Follow these instructions at home: Pay attention to any changes in your symptoms. Take these actions to help with your discomfort:  Take medicines only as told by your health care provider. ? If you were prescribed an antibiotic medicine, take it as told by your health care provider. Do not stop taking the antibiotic even if you start to feel better. ? Talk with your health care provider before you  take a cough suppressant medicine.  Drink enough fluid to keep your urine clear or pale yellow.  If the air is dry, use a cold steam vaporizer or humidifier in your bedroom or your home to help loosen secretions.  Avoid anything that causes you to cough at work or at home.  If your cough is worse at night, try sleeping in a semi-upright position.  Avoid cigarette smoke. If you smoke, quit smoking. If you need help quitting, ask your health care provider.  Avoid caffeine.  Avoid alcohol.  Rest as needed.  Contact a health care provider if:  You have new symptoms.  You cough up pus.  Your cough does not get better after 2-3 weeks, or your cough gets worse.  You cannot control your cough with suppressant medicines and you are losing sleep.  You develop pain that is getting worse or pain that is not controlled with pain medicines.  You have a fever.  You have unexplained weight loss.  You have night sweats. Get help right away if:  You cough up blood.  You have difficulty breathing.  Your heartbeat is  very fast. This information is not intended to replace advice given to you by your health care provider. Make sure you discuss any questions you have with your health care provider. Document Released: 04/18/2011 Document Revised: 03/27/2016 Document Reviewed: 12/27/2014 Elsevier Interactive Patient Education  [employer].    If you have lab work done today you will be contacted with your lab results within the next 2 weeks.  If you have not heard from [place] then please contact us. The fastest way to get your results is to register for My Chart.   IF you received an x-ray today, you will receive an invoice from [facility]. Please contact [facility] at [phone] with questions or concerns regarding your invoice.   IF you received labwork today, you will receive an invoice from [place]. Please contact [facility] at [phone] with questions or  concerns regarding your invoice.   Our billing staff will not be able to assist you with questions regarding bills from these companies.  You will be contacted with the lab results as soon as they are available. The fastest way to get your results is to activate your My Chart account. Instructions are located on the last page of this paperwork. If you have not heard from [place] regarding the results in 2 weeks, please contact this office.

## 2018-07-13 NOTE — Progress Notes (Signed)
Subjective:  By signing my name below, I, Essence Howell, attest that this documentation has been prepared under the direction and in the presence of [name], MD Electronically Signed: Ladene Artist, ED Scribe 07/13/2018 at 12:01 PM.   Patient ID: Edward Briggs, male    DOB: 1951-06-25, 67 y.o.   MRN: [id number]  Chief Complaint  Patient presents with  . Fall    fell off bed. Positive fall screening   . Chest congestion    2 weeks   HPI Edward Briggs is a 67 y.o. male who presents to Primary Care at [facility] complaining of chest congestion x 2 wks. Reports associated productive cough with yellow sputum. Denies fever, rhinorrhea, nasal congestion, sinus pain/pressure, sob, cp. No treatments tried PTA. No h/o CHF.  Pos Fall Screen Pt states that he was laying on the edge of his bed a few days ago when he rolled off the bed while sleeping. Denies any injuries.  Patient Active Problem List   Diagnosis Date Noted  . Type II or unspecified type diabetes mellitus without mention of complication, uncontrolled 09/10/2012  . GASTROESOPHAGEAL REFLUX DISEASE 07/21/2009  . CHEST PAIN 07/21/2009   Past Medical History:  Diagnosis Date  . Arthritis   . Diabetes mellitus without complication ([place])   . GERD (gastroesophageal reflux disease)    no meds   Past Surgical History:  Procedure Laterality Date  . ABSCESS DRAINAGE    . FRACTURE SURGERY    . HARDWARE REMOVAL  03/26/2012   Procedure: HARDWARE REMOVAL;  Surgeon: [name], MD;  Location: [facility];  Service: Orthopedics;  Laterality: Left;  hardware removal deep left ankle syndesmotic screws only and stress xrays ankle   . ORIF ANKLE FRACTURE  12/03/2011   Procedure: OPEN REDUCTION INTERNAL FIXATION (ORIF) ANKLE FRACTURE;  Surgeon: [name], MD;  Location: [facility];  Service: Orthopedics;  Laterality: Left;  left medial malleolus fracture  . PILONIDAL CYST EXCISION  ~1982  . SKIN  GRAFT  ~1970   Lt great toe (graft from Lt thigh)   No Known Allergies Prior to Admission medications   Medication Sig Start Date End Date Taking? Authorizing Provider  Ascorbic Acid (VITAMIN C PO) Take by mouth.    [provider]  aspirin 81 MG tablet Take 81 mg by mouth daily.    [provider]  Blood Glucose Monitoring Suppl (BLOOD GLUCOSE METER) kit Use as instructed 09/10/12   [name], MD  Guaifenesin [facility] MAXIMUM STRENGTH) 1200 MG TB12 Take 1 tablet (1,200 mg total) by mouth every 12 (twelve) hours as needed. 12/27/15   Shawnee Knapp, MD  lisinopril (PRINIVIL,ZESTRIL) 5 MG tablet TAKE 1 TABLET BY MOUTH EVERY DAY 06/01/18   [name], MD  metFORMIN (GLUCOPHAGE) 500 MG tablet TAKE 1 TABLET BY MOUTH TWICE A DAY WITH A MEAL  Office visit needed 09/10/17   [name], MD   Social History   Socioeconomic History  . Marital status: Married    Spouse name: Not on file  . Number of children: Not on file  . Years of education: Not on file  . Highest education level: Not on file  Occupational History  . Not on file  Social Needs  . Financial resource strain: Not on file  . Food insecurity:    Worry: Not on file    Inability: Not on file  . Transportation needs:    Medical: Not  on file    Non-medical: Not on file  Tobacco Use  . Smoking status: Former Smoker    Last attempt to quit: 10/30/1988    Years since quitting: 29.7  . Smokeless tobacco: Never Used  Substance and Sexual Activity  . Alcohol use: Yes    Alcohol/week: 1.0 standard drinks    Types: 1 Shots of liquor per week    Comment: every two weeks  . Drug use: No  . Sexual activity: Yes    Birth control/protection: None  Lifestyle  . Physical activity:    Days per week: Not on file    Minutes per session: Not on file  . Stress: Not on file  Relationships  . Social connections:    Talks on phone: Not on file    Gets together: Not on file    Attends religious service: Not  on file    Active member of club or organization: Not on file    Attends meetings of clubs or organizations: Not on file    Relationship status: Not on file  . Intimate partner violence:    Fear of current or ex partner: Not on file    Emotionally abused: Not on file    Physically abused: Not on file    Forced sexual activity: Not on file  Other Topics Concern  . Not on file  Social History Narrative  . Not on file   Review of Systems  Constitutional: Negative for fever.  HENT: Positive for congestion. Negative for rhinorrhea, sinus pressure and sinus pain.   Respiratory: Positive for cough. Negative for shortness of breath.   Cardiovascular: Negative for chest pain.      Objective:   Physical Exam  Constitutional: He is oriented to person, place, and time. He appears well-developed and well-nourished.  HENT:  Head: Normocephalic and atraumatic.  Right Ear: Tympanic membrane, external ear and ear canal normal.  Left Ear: Tympanic membrane, external ear and ear canal normal.  Nose: No rhinorrhea. Right sinus exhibits no maxillary sinus tenderness and no frontal sinus tenderness. Left sinus exhibits no maxillary sinus tenderness and no frontal sinus tenderness.  Mouth/Throat: Oropharynx is clear and moist and mucous membranes are normal. No oropharyngeal exudate or posterior oropharyngeal erythema.  Eyes: Pupils are equal, round, and reactive to light. Conjunctivae are normal.  Neck: Neck supple.  Cardiovascular: Normal rate, regular rhythm, normal heart sounds and intact distal pulses.  No murmur heard. Pulmonary/Chest: Effort normal and breath sounds normal. No respiratory distress. He has no wheezes. He has no rhonchi. He has no rales.  Few faint coarse breath sounds R lower lobe.  Abdominal: Soft. There is no tenderness.  Musculoskeletal: He exhibits no edema.  No sig pedal edema.  Lymphadenopathy:    He has no cervical adenopathy.  Neurological: He is alert and oriented to  person, place, and time.  Skin: Skin is warm and dry. No rash noted.  Psychiatric: He has a normal mood and affect. His behavior is normal.  Vitals reviewed.  Vitals:   07/13/18 1144  BP: 137/88  Pulse: 84  Temp: 98.4 F (36.9 C)  TempSrc: Oral  SpO2: 99%  Weight: 257 lb 12.8 oz (116.9 kg)  Height: _0  (1.854 m)      Assessment & Plan:  Edward Briggs is a 66 y.o. male Cough - Plan: azithromycin (ZITHROMAX) 250 MG tablet  LRTI (lower respiratory tract infection) - Plan: azithromycin (ZITHROMAX) 250 MG tablet  Persistent cough, suspected lower respiratory  infection/bronchitis.  Some asymmetry with rhonchi, DDX includes mild community acquired pneumonia but no distress, reassuring vital signs.  Imaging deferred at present.  -Start Z-Pak, potential side effects discussed.  Mucinex for cough, coupon given.  RTC precautions if not improving, sooner if worse.   Meds ordered this encounter  Medications  . azithromycin (ZITHROMAX) 250 MG tablet    Sig: Take 2 pills by mouth on day 1, then 1 pill by mouth per day on days 2 through 5.    Dispense:  6 tablet    Refill:  0   Patient Instructions   Start antibiotic for possible bronchitis or chest congestion.  Mucinex during the day for cough as needed. If cough is not improving within the next week to 10 days or any worsening sooner, please return for recheck and possible x-rays.  Please follow-up in the next few weeks to review other chronic medical concerns/medications.  Thank you for coming in today.   Acute Bronchitis, Adult Acute bronchitis is sudden (acute) swelling of the air tubes (bronchi) in the lungs. Acute bronchitis causes these tubes to fill with mucus, which can make it hard to breathe. It can also cause coughing or wheezing. In adults, acute bronchitis usually goes away within 2 weeks. A cough caused by bronchitis may last up to 3 weeks. Smoking, allergies, and asthma can make the condition worse. Repeated episodes of  bronchitis may cause further lung problems, such as chronic obstructive pulmonary disease (COPD). What are the causes? This condition can be caused by germs and by substances that irritate the lungs, including:  Cold and flu viruses. This condition is most often caused by the same virus that causes a cold.  Bacteria.  Exposure to tobacco smoke, dust, fumes, and air pollution.  What increases the risk? This condition is more likely to develop in people who:  Have close contact with someone with acute bronchitis.  Are exposed to lung irritants, such as tobacco smoke, dust, fumes, and vapors.  Have a weak immune system.  Have a respiratory condition such as asthma.  What are the signs or symptoms? Symptoms of this condition include:  A cough.  Coughing up clear, yellow, or green mucus.  Wheezing.  Chest congestion.  Shortness of breath.  A fever.  Body aches.  Chills.  A sore throat.  How is this diagnosed? This condition is usually diagnosed with a physical exam. During the exam, your health care provider may order tests, such as chest X-rays, to rule out other conditions. He or she may also:  Test a sample of your mucus for bacterial infection.  Check the level of oxygen in your blood. This is done to check for pneumonia.  Do a chest X-ray or lung function testing to rule out pneumonia and other conditions.  Perform blood tests.  Your health care provider will also ask about your symptoms and medical history. How is this treated? Most cases of acute bronchitis clear up over time without treatment. Your health care provider may recommend:  Drinking more fluids. Drinking more makes your mucus thinner, which may make it easier to breathe.  Taking a medicine for a fever or cough.  Taking an antibiotic medicine.  Using an inhaler to help improve shortness of breath and to control a cough.  Using a cool mist vaporizer or humidifier to make it easier to  breathe.  Follow these instructions at home: Medicines  Take over-the-counter and prescription medicines only as told by your health care provider.  If you were prescribed an antibiotic, take it as told by your health care provider. Do not stop taking the antibiotic even if you start to feel better. General instructions  Get plenty of rest.  Drink enough fluids to keep your urine clear or pale yellow.  Avoid smoking and secondhand smoke. Exposure to cigarette smoke or irritating chemicals will make bronchitis worse. If you smoke and you need help quitting, ask your health care provider. Quitting smoking will help your lungs heal faster.  Use an inhaler, cool mist vaporizer, or humidifier as told by your health care provider.  Keep all follow-up visits as told by your health care provider. This is important. How is this prevented? To lower your risk of getting this condition again:  Wash your hands often with soap and water. If soap and water are not available, use hand sanitizer.  Avoid contact with people who have cold symptoms.  Try not to touch your hands to your mouth, nose, or eyes.  Make sure to get the flu shot every year.  Contact a health care provider if:  Your symptoms do not improve in 2 weeks of treatment. Get help right away if:  You cough up blood.  You have chest pain.  You have severe shortness of breath.  You become dehydrated.  You faint or keep feeling like you are going to faint.  You keep vomiting.  You have a severe headache.  Your fever or chills gets worse. This information is not intended to replace advice given to you by your health care provider. Make sure you discuss any questions you have with your health care provider. Document Released: 11/27/2004 Document Revised: 05/14/2016 Document Reviewed: 04/09/2016 Elsevier Interactive Patient Education  2018 [place].    Cough, Adult Coughing is a reflex that clears your throat and  your airways. Coughing helps to heal and protect your lungs. It is normal to cough occasionally, but a cough that happens with other symptoms or lasts a long time may be a sign of a condition that needs treatment. A cough may last only 2-3 weeks (acute), or it may last longer than 8 weeks (chronic). What are the causes? Coughing is commonly caused by:  Breathing in substances that irritate your lungs.  A viral or bacterial respiratory infection.  Allergies.  Asthma.  Postnasal drip.  Smoking.  Acid backing up from the stomach into the esophagus (gastroesophageal reflux).  Certain medicines.  Chronic lung problems, including COPD (or rarely, lung cancer).  Other medical conditions such as heart failure.  Follow these instructions at home: Pay attention to any changes in your symptoms. Take these actions to help with your discomfort:  Take medicines only as told by your health care provider. ? If you were prescribed an antibiotic medicine, take it as told by your health care provider. Do not stop taking the antibiotic even if you start to feel better. ? Talk with your health care provider before you take a cough suppressant medicine.  Drink enough fluid to keep your urine clear or pale yellow.  If the air is dry, use a cold steam vaporizer or humidifier in your bedroom or your home to help loosen secretions.  Avoid anything that causes you to cough at work or at home.  If your cough is worse at night, try sleeping in a semi-upright position.  Avoid cigarette smoke. If you smoke, quit smoking. If you need help quitting, ask your health care provider.  Avoid caffeine.  Avoid alcohol.  Rest as needed.  Contact a health care provider if:  You have new symptoms.  You cough up pus.  Your cough does not get better after 2-3 weeks, or your cough gets worse.  You cannot control your cough with suppressant medicines and you are losing sleep.  You develop pain that is  getting worse or pain that is not controlled with pain medicines.  You have a fever.  You have unexplained weight loss.  You have night sweats. Get help right away if:  You cough up blood.  You have difficulty breathing.  Your heartbeat is very fast. This information is not intended to replace advice given to you by your health care provider. Make sure you discuss any questions you have with your health care provider. Document Released: 04/18/2011 Document Revised: 03/27/2016 Document Reviewed: 12/27/2014 Elsevier Interactive Patient Education  [employer].    If you have lab work done today you will be contacted with your lab results within the next 2 weeks.  If you have not heard from [place] then please contact us. The fastest way to get your results is to register for My Chart.   IF you received an x-ray today, you will receive an invoice from [facility]. Please contact [facility] at [phone] with questions or concerns regarding your invoice.   IF you received labwork today, you will receive an invoice from [place]. Please contact [facility] at [phone] with questions or concerns regarding your invoice.   Our billing staff will not be able to assist you with questions regarding bills from these companies.  You will be contacted with the lab results as soon as they are available. The fastest way to get your results is to activate your My Chart account. Instructions are located on the last page of this paperwork. If you have not heard from [place] regarding the results in 2 weeks, please contact this office.       I personally performed the services described in this documentation, which was scribed in my presence. The recorded information has been reviewed and considered for accuracy and completeness, addended by me as needed, and agree with information above.  Signed,   [name], MD Primary [facility] at [facility].   07/13/18 3:34 PM

## 2018-08-23 ENCOUNTER — Ambulatory Visit: Payer: Medicare Other | Admitting: Podiatry

## 2018-11-17 ENCOUNTER — Encounter: Payer: Self-pay | Admitting: Podiatry

## 2018-11-17 ENCOUNTER — Ambulatory Visit (INDEPENDENT_AMBULATORY_CARE_PROVIDER_SITE_OTHER): Payer: Medicare Other | Admitting: Podiatry

## 2018-11-17 VITALS — BP 114/70

## 2018-11-17 DIAGNOSIS — E1142 Type 2 diabetes mellitus with diabetic polyneuropathy: Secondary | ICD-10-CM

## 2018-11-17 DIAGNOSIS — M79674 Pain in right toe(s): Secondary | ICD-10-CM | POA: Diagnosis not present

## 2018-11-17 DIAGNOSIS — M79675 Pain in left toe(s): Secondary | ICD-10-CM | POA: Diagnosis not present

## 2018-11-17 DIAGNOSIS — B351 Tinea unguium: Secondary | ICD-10-CM

## 2018-11-17 NOTE — Patient Instructions (Addendum)
Diabetes Mellitus and Foot Care Foot care is an important part of your health, especially when you have diabetes. Diabetes may cause you to have problems because of poor blood flow (circulation) to your feet and legs, which can cause your skin to:  Become thinner and drier.  Break more easily.  Heal more slowly.  Peel and crack. You may also have nerve damage (neuropathy) in your legs and feet, causing decreased feeling in them. This means that you may not notice minor injuries to your feet that could lead to more serious problems. Noticing and addressing any potential problems early is the best way to prevent future foot problems. How to care for your feet Foot hygiene  Wash your feet daily with warm water and mild soap. Do not use hot water. Then, pat your feet and the areas between your toes until they are completely dry. Do not soak your feet as this can dry your skin.  Trim your toenails straight across. Do not dig under them or around the cuticle. File the edges of your nails with an emery board or nail file.  Apply a moisturizing lotion or petroleum jelly to the skin on your feet and to dry, brittle toenails. Use lotion that does not contain alcohol and is unscented. Do not apply lotion between your toes. Shoes and socks  Wear clean socks or stockings every day. Make sure they are not too tight. Do not wear knee-high stockings since they may decrease blood flow to your legs.  Wear shoes that fit properly and have enough cushioning. Always look in your shoes before you put them on to be sure there are no objects inside.  To break in new shoes, wear them for just a few hours a day. This prevents injuries on your feet. Wounds, scrapes, corns, and calluses  Check your feet daily for blisters, cuts, bruises, sores, and redness. If you cannot see the bottom of your feet, use a mirror or ask someone for help.  Do not cut corns or calluses or try to remove them with medicine.  If you  find a minor scrape, cut, or break in the skin on your feet, keep it and the skin around it clean and dry. You may clean these areas with mild soap and water. Do not clean the area with peroxide, alcohol, or iodine.  If you have a wound, scrape, corn, or callus on your foot, look at it several times a day to make sure it is healing and not infected. Check for: ? Redness, swelling, or pain. ? Fluid or blood. ? Warmth. ? Pus or a bad smell. General instructions  Do not cross your legs. This may decrease blood flow to your feet.  Do not use heating pads or hot water bottles on your feet. They may burn your skin. If you have lost feeling in your feet or legs, you may not know this is happening until it is too late.  Protect your feet from hot and cold by wearing shoes, such as at the beach or on hot pavement.  Schedule a complete foot exam at least once a year (annually) or more often if you have foot problems. If you have foot problems, report any cuts, sores, or bruises to your health care provider immediately. Contact a health care provider if:  You have a medical condition that increases your risk of infection and you have any cuts, sores, or bruises on your feet.  You have an injury that is not   healing.  You have redness on your legs or feet.  You feel burning or tingling in your legs or feet.  You have pain or cramps in your legs and feet.  Your legs or feet are numb.  Your feet always feel cold.  You have pain around a toenail. Get help right away if:  You have a wound, scrape, corn, or callus on your foot and: ? You have pain, swelling, or redness that gets worse. ? You have fluid or blood coming from the wound, scrape, corn, or callus. ? Your wound, scrape, corn, or callus feels warm to the touch. ? You have pus or a bad smell coming from the wound, scrape, corn, or callus. ? You have a fever. ? You have a red line going up your leg. Summary  Check your feet every day  for cuts, sores, red spots, swelling, and blisters.  Moisturize feet and legs daily.  Wear shoes that fit properly and have enough cushioning.  If you have foot problems, report any cuts, sores, or bruises to your health care provider immediately.  Schedule a complete foot exam at least once a year (annually) or more often if you have foot problems. This information is not intended to replace advice given to you by your health care provider. Make sure you discuss any questions you have with your health care provider. Document Released: 10/17/2000 Document Revised: 12/02/2017 Document Reviewed: 11/21/2016 Elsevier Interactive Patient Education  2019 [employer]  Diabetic Neuropathy Diabetic neuropathy refers to nerve damage that is caused by diabetes (diabetes mellitus). Over time, people with diabetes can develop nerve damage throughout the body. There are several types of diabetic neuropathy:  Peripheral neuropathy. This is the most common type of diabetic neuropathy. It causes damage to nerves that carry signals between the spinal cord and other parts of the body (peripheral nerves). This usually affects nerves in the feet and legs first, and may eventually affect the hands and arms. The damage affects the ability to sense touch or temperature.  Autonomic neuropathy. This type causes damage to nerves that control involuntary functions (autonomic nerves). These nerves carry signals that control: ? Heartbeat. ? Body temperature. ? Blood pressure. ? Urination. ? Digestion. ? Sweating. ? Sexual function. ? Response to changing blood sugar (glucose) levels.  Focal neuropathy. This type of nerve damage affects one area of the body, such as an arm, a leg, or the face. The injury may involve one nerve or a small group of nerves. Focal neuropathy can be painful and unpredictable, and occurs most often in older adults with diabetes. This often develops suddenly, but usually improves over time  and does not cause long-term problems.  Proximal neuropathy. This type of nerve damage affects the nerves of the thighs, hips, buttocks, or legs. It causes severe pain, weakness, and muscle death (atrophy), usually in the thigh muscles. It is more common among older men and people who have type 2 diabetes. The length of recovery time may vary. What are the causes? Peripheral, autonomic, and focal neuropathies are caused by diabetes that is not well controlled with treatment. The cause of proximal neuropathy is not known, but it may be caused by inflammation related to uncontrolled blood glucose levels. What are the signs or symptoms? Peripheral neuropathy Peripheral neuropathy develops slowly over time. When the nerves of the feet and legs no longer work, you may experience:  Burning, stabbing, or aching pain in the legs or feet.  Pain or cramping in the   legs or feet.  Loss of feeling (numbness) and inability to feel pressure or pain in the feet. This can lead to: ? Thick calluses or sores on areas of constant pressure. ? Ulcers. ? Reduced ability to feel temperature changes.  Foot deformities.  Muscle weakness.  Loss of balance or coordination. Autonomic neuropathy The symptoms of autonomic neuropathy vary depending on which nerves are affected. Symptoms may include:  Problems with digestion, such as: ? Nausea or vomiting. ? Poor appetite. ? Bloating. ? Diarrhea or constipation. ? Trouble swallowing. ? Losing weight without trying to.  Problems with the heart, blood and lungs, such as: ? Dizziness, especially when standing up. ? Fainting. ? Shortness of breath. ? Irregular heartbeat.  Bladder problems, such as: ? Trouble starting or stopping urination. ? Leaking urine. ? Trouble emptying the bladder. ? Urinary tract infections (UTIs).  Problems with other body functions, such as: ? Sweat. You may sweat too much or too little. ? Temperature. You might get hot easily.  Or, you might feel cold more than usual. ? Sexual function. Men may not be able to get or maintain an erection. Women may have vaginal dryness and difficulty with arousal. Focal neuropathy Symptoms affect only one area of the body. Common symptoms include:  Numbness.  Tingling.  Burning pain.  Prickling feeling.  Very sensitive skin.  Weakness.  Inability to move (paralysis).  Muscle twitching.  Muscles getting smaller (wasting).  Poor coordination.  Double or blurred vision. Proximal neuropathy  Sudden, severe pain in the hip, thigh, or buttocks. Pain may spread from the back into the legs (sciatica).  Pain and numbness in the arms and legs.  Tingling.  Loss of bladder control or bowel control.  Weakness and wasting of thigh muscles.  Difficulty getting up from a seated position.  Abdominal swelling.  Unexplained weight loss. How is this diagnosed? Diagnosis usually involves reviewing your medical history and any symptoms you have. Diagnosis varies depending on the type of neuropathy your health care provider suspects. Peripheral neuropathy Your health care provider will check areas that are affected by your nervous system (neurologic exam), such as your reflexes, how you move, and what you can feel. You may have other tests, such as:  Blood tests.  Removal and examination of fluid that surrounds the spinal cord (lumbar puncture).  CT scan.  MRI.  A test to check the nerves that control muscles (electromyogram, EMG).  Tests of how quickly messages pass through your nerves (nerve conduction velocity tests).  Removal of a small piece of nerve to be examined under a microscope (biopsy). Autonomic neuropathy You may have tests, such as:  Tests to measure your blood pressure and heart rate. This may include monitoring you while you are safely secured to an exam table that moves you from a lying position to an upright position (table tilt test).  Breathing  tests to check your lungs.  Tests to check how food moves through the digestive system (gastric emptying tests).  Blood, sweat, or urine tests.  Ultrasound of your bladder.  Spinal fluid tests. Focal neuropathy This condition may be diagnosed with:  A neurologic exam.  CT scan.  MRI.  EMG.  Nerve conduction velocity tests. Proximal neuropathy There is no test to diagnose this type of neuropathy. You may have tests to rule out other possible causes of this type of neuropathy. Tests may include:  X-rays of your spine and lumbar region.  Lumbar puncture.  MRI. How is this treated? The   goal of treatment is to keep nerve damage from getting worse. The most important part of treatment is keeping your blood glucose level and your A1C level within your target range by following your diabetes management plan. Over time, maintaining lower blood glucose levels helps lessen symptoms. In some cases, you may need prescription pain medicine. Follow these instructions at home:  Lifestyle   Do not use any products that contain nicotine or tobacco, such as cigarettes and e-cigarettes. If you need help quitting, ask your health care provider.  Be physically active every day. Include strength training and balance exercises.  Follow a healthy meal plan.  Work with your health care provider to manage your blood pressure. General instructions  Follow your diabetes management plan as directed. ? Check your blood glucose levels as directed by your health care provider. ? Keep your blood glucose in your target range as directed by your health care provider. ? Have your A1C level checked at least two times a year, or as often as told by your health care provider.  Take over the counter and prescription medicines only as told by your health care provider. This includes insulin and diabetes medicine.  Do not drive or use heavy machinery while taking prescription pain medicines.  Check your  skin and feet every day for cuts, bruises, redness, blisters, or sores.  Keep all follow up visits as told by your health care provider. This is important. Contact a health care provider if:  You have burning, stabbing, or aching pain in your legs or feet.  You are unable to feel pressure or pain in your feet.  You develop problems with digestion, such as: ? Nausea. ? Vomiting. ? Bloating. ? Constipation. ? Diarrhea. ? Abdominal pain.  You have difficulty with urination, such as inability: ? To control when you urinate (incontinence). ? To completely empty the bladder (retention).  You have palpitations.  You feel dizzy, weak, or faint when you stand up. Get help right away if:  You cannot urinate.  You have sudden weakness or loss of coordination.  You have trouble speaking.  You have pain or pressure in your chest.  You have an irregular heart beat.  You have sudden inability to move a part of your body. Summary  Diabetic neuropathy refers to nerve damage that is caused by diabetes. It can affect nerves throughout the entire body, causing numbness and pain in the arms, legs, digestive tract, heart, and other body systems.  Keep your blood glucose level and your blood pressure in your target range, as directed by your health care provider. This can help prevent neuropathy from getting worse.  Check your skin and feet every day for cuts, bruises, redness, blisters, or sores.  Do not use any products that contain nicotine or tobacco, such as cigarettes and e-cigarettes. If you need help quitting, ask your health care provider. This information is not intended to replace advice given to you by your health care provider. Make sure you discuss any questions you have with your health care provider. Document Released: 12/29/2001 Document Revised: 12/02/2017 Document Reviewed: 11/24/2016 Elsevier Interactive Patient Education  2019 [employer] Onychomycosis/Fungal  Toenails  WHAT IS IT? An infection that lies within the keratin of your nail plate that is caused by a fungus.  WHY ME? Fungal infections affect all ages, sexes, races, and creeds.  There may be many factors that predispose you to a fungal infection such as age, coexisting medical conditions such as diabetes, or   an autoimmune disease; stress, medications, fatigue, genetics, etc.  Bottom line: fungus thrives in a warm, moist environment and your shoes offer such a location.  IS IT CONTAGIOUS? Theoretically, yes.  You do not want to share shoes, nail clippers or files with someone who has fungal toenails.  Walking around barefoot in the same room or sleeping in the same bed is unlikely to transfer the organism.  It is important to realize, however, that fungus can spread easily from one nail to the next on the same foot.  HOW DO WE TREAT THIS?  There are several ways to treat this condition.  Treatment may depend on many factors such as age, medications, pregnancy, liver and kidney conditions, etc.  It is best to ask your doctor which options are available to you.  1. No treatment.   Unlike many other medical concerns, you can live with this condition.  However for many people this can be a painful condition and may lead to ingrown toenails or a bacterial infection.  It is recommended that you keep the nails cut short to help reduce the amount of fungal nail. 2. Topical treatment.  These range from herbal remedies to prescription strength nail lacquers.  About 40-50% effective, topicals require twice daily application for approximately 9 to 12 months or until an entirely new nail has grown out.  The most effective topicals are medical grade medications available through physicians offices. 3. Oral antifungal medications.  With an 80-90% cure rate, the most common oral medication requires 3 to 4 months of therapy and stays in your system for a year as the new nail grows out.  Oral antifungal medications do  require blood work to make sure it is a safe drug for you.  A liver function panel will be performed prior to starting the medication and after the first month of treatment.  It is important to have the blood work performed to avoid any harmful side effects.  In general, this medication safe but blood work is required. 4. Laser Therapy.  This treatment is performed by applying a specialized laser to the affected nail plate.  This therapy is noninvasive, fast, and non-painful.  It is not covered by insurance and is therefore, out of pocket.  The results have been very good with a 80-95% cure rate.  [facility] is the only practice in the area to offer this therapy. 5. Permanent Nail Avulsion.  Removing the entire nail so that a new nail will not grow back. 

## 2018-12-05 NOTE — Progress Notes (Signed)
Subjective: Edward Briggs presents today referred by [name], MD with diabetes and cc of painful, discolored, thick toenails which interfere with daily activities.  Pain is aggravated when wearing enclosed shoe gear. Pain is relieved with periodic professional debridement. Patient has see Podiatrist in the past, but Podiatrist has retired.  Last A1C: 9.0  Past Medical History:  Diagnosis Date  . Arthritis   . Diabetes mellitus without complication ([place])   . GERD (gastroesophageal reflux disease)    no meds    Patient Active Problem List   Diagnosis Date Noted  . Type II or unspecified type diabetes mellitus without mention of complication, uncontrolled 09/10/2012  . GASTROESOPHAGEAL REFLUX DISEASE 07/21/2009  . CHEST PAIN 07/21/2009    Past Surgical History:  Procedure Laterality Date  . ABSCESS DRAINAGE    . FRACTURE SURGERY    . HARDWARE REMOVAL  03/26/2012   Procedure: HARDWARE REMOVAL;  Surgeon: [name], MD;  Location: [place];  Service: Orthopedics;  Laterality: Left;  hardware removal deep left ankle syndesmotic screws only and stress xrays ankle   . ORIF ANKLE FRACTURE  12/03/2011   Procedure: OPEN REDUCTION INTERNAL FIXATION (ORIF) ANKLE FRACTURE;  Surgeon: [name], MD;  Location: [facility];  Service: Orthopedics;  Laterality: Left;  left medial malleolus fracture  . PILONIDAL CYST EXCISION  ~1982  . SKIN GRAFT  ~1970   Lt great toe (graft from Lt thigh)     Current Outpatient Medications:  .  Ascorbic Acid (VITAMIN C PO), Take by mouth., Disp: , Rfl:  .  aspirin 81 MG tablet, Take 81 mg by mouth daily., Disp: , Rfl:  .  Blood Glucose Monitoring Suppl (BLOOD GLUCOSE METER) kit, Use as instructed, Disp: 1 each, Rfl: 0 .  Guaifenesin (MUCINEX MAXIMUM STRENGTH) 1200 MG TB12, Take 1 tablet (1,200 mg total) by mouth every 12 (twelve) hours as needed., Disp: 14 tablet, Rfl: 1 .  lisinopril (PRINIVIL,ZESTRIL) 5 MG  tablet, TAKE 1 TABLET BY MOUTH EVERY DAY, Disp: 30 tablet, Rfl: 0 .  metFORMIN (GLUCOPHAGE) 500 MG tablet, TAKE 1 TABLET BY MOUTH TWICE A DAY WITH A MEAL  Office visit needed, Disp: 30 tablet, Rfl: 0 .  azithromycin (ZITHROMAX) 250 MG tablet, Take 2 pills by mouth on day 1, then 1 pill by mouth per day on days 2 through 5. (Patient not taking: Reported on 11/17/2018), Disp: 6 tablet, Rfl: 0  No Known Allergies  Social History   Occupational History  . Not on file  Tobacco Use  . Smoking status: Former Smoker    Last attempt to quit: 10/30/1988    Years since quitting: 30.1  . Smokeless tobacco: Never Used  Substance and Sexual Activity  . Alcohol use: Yes    Alcohol/week: 1.0 standard drinks    Types: 1 Shots of liquor per week    Comment: every two weeks  . Drug use: No  . Sexual activity: Yes    Birth control/protection: None    Family History  Problem Relation Age of Onset  . Cancer Mother   . Cancer Father     Immunization History  Administered Date(s) Administered  . Pneumococcal Conjugate-13 04/23/2017     Review of systems: Positive Findings in bold print.  Constitutional:  chills, fatigue, fever, sweats, weight change Communication: Optometrist, sign Ecologist, hand writing, iPad/Android device Head: headaches, head injury Eyes: changes in vision, eye pain, glaucoma, cataracts, macular degeneration, diplopia, glare,  light  sensitivity, eyeglasses or contacts, blindness Ears nose mouth throat: Hard of hearing, ringing in ears, deaf, sign language,  vertigo,   nosebleeds,  rhinitis,  cold sores, snoring, swollen glands Cardiovascular: HTN, edema, arrhythmia, pacemaker in place, defibrillator in place,  chest pain/tightness, chronic anticoagulation, blood clot, heart failure Peripheral Vascular: leg cramps, varicose veins, blood clots, lymphedema Respiratory:  difficulty breathing, denies congestion, SOB, wheezing, cough, emphysema Gastrointestinal: change  in appetite or weight, abdominal pain, constipation, diarrhea, nausea, vomiting, vomiting blood, change in bowel habits, abdominal pain, jaundice, rectal bleeding, hemorrhoids, GERD Genitourinary:  nocturia,  pain on urination,  blood in urine, Foley catheter, urinary urgency Musculoskeletal: uses mobility aid,  cramping, stiff joints, painful joints, decreased joint motion, fractures, OA, gout Skin: +changes in toenails, color change, dryness, itching, mole changes,  rash  Neurological: headaches, numbness in feet, paresthesias in feet, burning in feet, fainting,  seizures, change in speech. denies headaches, memory problems/poor historian, cerebral palsy, weakness, paralysis Endocrine: diabetes, hypothyroidism, hyperthyroidism,  goiter, dry mouth, flushing, heat intolerance,  cold intolerance,  excessive thirst, denies polyuria,  nocturia Hematological:  easy bleeding, excessive bleeding, easy bruising, enlarged lymph nodes, on long term blood thinner, history of past transusions Allergy/immunological:  hives, eczema, frequent infections, multiple drug allergies, seasonal allergies, transplant recipient Psychiatric:  anxiety, depression, mood disorder, suicidal ideations, hallucinations   Objective: Vascular Examination: Capillary refill time immediate x 10 digits Dorsalis pedis 2/4 b/l  Posterior tibial pulses 1/4 b/l Decreased digital hair x 10 digits Skin temperature gradient WNL b/l  Dermatological Examination: Skin with normal turgor, texture and tone b/l  Toenails 1-5 b/l discolored, thick, dystrophic with subungual debris and pain with palpation to nailbeds due to thickness of nails.  Musculoskeletal: Muscle strength 5/5 to all LE muscle groups  Neurological: Sensation intact with 10 gram monofilament b/l Vibratory sensation decreased b/l  Assessment: 1. Painful onychomycosis toenails 1-5 b/l  2. NIDDM with early neuropathy  Plan: 1. Discussed diabetic foot care  principles. Literature dispensed on today. 2. Toenails 1-5 b/l were debrided in length and girth without iatrogenic bleeding. 3. Patient to continue soft, supportive shoe gear 4. Patient to report any pedal injuries to medical professional immediately. 5. Follow up 3 months. Patient/POA to call should there be a concern in the interim.

## 2018-12-07 ENCOUNTER — Encounter: Payer: Self-pay | Admitting: Family Medicine

## 2018-12-07 ENCOUNTER — Other Ambulatory Visit: Payer: Self-pay

## 2018-12-07 ENCOUNTER — Ambulatory Visit (INDEPENDENT_AMBULATORY_CARE_PROVIDER_SITE_OTHER): Payer: Medicare Other | Admitting: Family Medicine

## 2018-12-07 VITALS — BP 132/82 | HR 72 | Temp 98.4°F | Resp 16 | Ht 73.0 in | Wt 255.0 lb

## 2018-12-07 DIAGNOSIS — N476 Balanoposthitis: Secondary | ICD-10-CM

## 2018-12-07 DIAGNOSIS — Z23 Encounter for immunization: Secondary | ICD-10-CM

## 2018-12-07 DIAGNOSIS — Z9114 Patient's other noncompliance with medication regimen: Secondary | ICD-10-CM | POA: Diagnosis not present

## 2018-12-07 DIAGNOSIS — Z125 Encounter for screening for malignant neoplasm of prostate: Secondary | ICD-10-CM | POA: Diagnosis not present

## 2018-12-07 DIAGNOSIS — E119 Type 2 diabetes mellitus without complications: Secondary | ICD-10-CM | POA: Diagnosis not present

## 2018-12-07 DIAGNOSIS — Z0000 Encounter for general adult medical examination without abnormal findings: Secondary | ICD-10-CM

## 2018-12-07 DIAGNOSIS — Z1211 Encounter for screening for malignant neoplasm of colon: Secondary | ICD-10-CM | POA: Diagnosis not present

## 2018-12-07 DIAGNOSIS — E1165 Type 2 diabetes mellitus with hyperglycemia: Secondary | ICD-10-CM | POA: Diagnosis not present

## 2018-12-07 LAB — POCT GLYCOSYLATED HEMOGLOBIN (HGB A1C): Hemoglobin A1C: 14 % — AB (ref 4.0–5.6)

## 2018-12-07 LAB — POCT URINALYSIS DIP (MANUAL ENTRY)
Bilirubin, UA: NEGATIVE
Glucose, UA: 500 mg/dL — AB
Ketones, POC UA: NEGATIVE mg/dL
Nitrite, UA: NEGATIVE
Protein Ur, POC: NEGATIVE mg/dL
SPEC GRAV UA: 1.015 (ref 1.010–1.025)
Urobilinogen, UA: 0.2 E.U./dL
pH, UA: 5.5 (ref 5.0–8.0)

## 2018-12-07 LAB — GLUCOSE, POCT (MANUAL RESULT ENTRY): POC Glucose: 376 mg/dl — AB (ref 70–99)

## 2018-12-07 MED ORDER — HYDROCORTISONE 1 % EX LOTN: 1.0000 "application " | TOPICAL_LOTION | Freq: Two times a day (BID) | CUTANEOUS | 0 refills | Status: DC

## 2018-12-07 MED ORDER — PEN NEEDLES 32G X 4 MM MISC: 1.0000 "application " | Freq: Every day | 2 refills | Status: DC

## 2018-12-07 MED ORDER — FLUCONAZOLE 150 MG PO TABS: 150.0000 mg | ORAL_TABLET | Freq: Once | ORAL | 0 refills | Status: AC

## 2018-12-07 MED ORDER — INSULIN GLARGINE 100 UNIT/ML SOLOSTAR PEN: 10.0000 [IU] | PEN_INJECTOR | Freq: Every day | SUBCUTANEOUS | 1 refills | Status: DC

## 2018-12-07 NOTE — Patient Instructions (Addendum)
Memory test looked ok today, but if you notice more memory problems, return for recheck and other testing.   Swelling at the foreskin of the penis is commonly due to fungal infection.  Take Diflucan that was prescribed today, hydrocortisone lotion twice per day to the irritated area to see if that will help with swelling and recheck in the next 3 to 4 days.  If at any point the foreskin is retracted and unable to replace the foreskin, go to emergency room.  Diabetes is uncontrolled. That is likely contributing to infection.  Use the pill box and setting an alarm can help with taking medications.  Start insulin once per day and recheck in 3 days with Dr. Nolon Rod.  Check blood sugars fasting and 2 hours after meal.   Watch for low blood sugar symptoms - see precautions below.     Insulin Injection Instructions, Using Insulin Pens, Adult A subcutaneous injection is a shot of medicine that is injected into the layer of fat and tissue between skin and muscle. People with type 1 diabetes must take insulin because their bodies do not make it. People with type 2 diabetes may need to take insulin. There are many different types of insulin. The type of insulin that you take may determine how many injections you give yourself and when you need to give the injections. Supplies needed:  Soap and water to wash hands.  Your insulin pen.  A new, unused needle.  Alcohol wipes.  A disposal container that is meant for sharp items (sharps container), such as an empty plastic bottle with a cover. How to choose a site for injection The body absorbs insulin differently, depending on where the insulin is injected (injection site). It is best to inject insulin into the same body area each time (for example, always in the abdomen), but you should use a different spot in that area for each injection. Do not inject the insulin in the same spot each time. There are five main areas that can be used for injecting.  These areas include:  Abdomen. This is the preferred area.  Front of thigh.  Upper, outer side of thigh.  Upper, outer side of arm.  Upper, outer part of buttock. How to use an insulin pen  First, follow the steps for Get ready, then continue with the steps for Inject the insulin. Get ready 1. Wash your hands with soap and water. If soap and water are not available, use hand sanitizer. 2. Before you give yourself an insulin injection, be sure to test your blood sugar level (blood glucose level) and write down that number. Follow any instructions from your health care provider about what to do if your blood glucose level is higher or lower than your normal range. 3. Check the expiration date and the type of insulin that is in the pen. 4. If you are using CLEAR insulin, check to see that it is clear and free of clumps. 5. If you are using CLOUDY insulin, do not shake the pen to get the injection ready. Instead, get it ready in one of these ways: ? Gently roll the pen between your palms several times. ? Tip the pen up and down several times. 6. Remove the cap from the insulin pen. 7. Use an alcohol wipe to clean the rubber tip of the pen. 8. Remove the protective paper tab from the disposable needle. Do not let the needle touch anything. 9. Screw a new, unused needle onto the pen.  10. Remove the outer plastic needle cover. Do not throw away the outer plastic cover yet. ? If the pen uses a special safety needle, leave the inner needle shield in place. ? If the pen does not use a special safety needle, remove the inner plastic cover from the needle. 11. Follow the manufacturer's instructions to prime the insulin pen with the volume of insulin needed. Hold the pen with the needle pointing up, and push the button on the opposite end of the pen until a drop of insulin appears at the needle tip. If no insulin appears, repeat this step. 12. Turn the button (dial) to the number of units of insulin  that you will be injecting. Inject the insulin 1. Use an alcohol wipe to clean the site where you will be injecting the needle. Let the site air-dry. 2. Hold the pen in the palm of your writing hand like a pencil. 3. If directed by your health care provider, use your other hand to pinch and hold about an inch (2.5 cm) of skin at the injection site. Do not directly touch the cleaned part of the skin. 4. Gently but quickly, use your writing hand to put the needle straight into the skin. The needle should be at a 90-degree angle (perpendicular) to the skin. 5. When the needle is completely inserted into the skin, use your thumb or index finger of your writing hand to push the top button of the pen down all the way to inject the insulin. 6. Let go of the skin that you are pinching. Continue to hold the pen in place with your writing hand. 7. Wait 10 seconds, then pull the needle straight out of the skin. This will allow all of the insulin to go from the pen and needle into your body. 8. Carefully put the larger (outer) plastic cover of the needle back over the needle, then unscrew the capped needle and discard it in a sharps container, such as an empty plastic bottle with a cover. 9. Put the plastic cap back on the insulin pen. How to throw away supplies  Discard all used needles in a puncture-proof sharps disposal container. You can ask your local pharmacy about where you can get this kind of disposal container, or you can use an empty plastic liquid laundry detergent bottle that has a cover.  Follow the disposal regulations for the area where you live. Do not use any needle more than one time.  Throw away empty disposable pens in the regular trash. Questions to ask your health care provider  How often should I be taking insulin?  How often should I check my blood glucose?  What amount of insulin should I be taking at each time?  What are the side effects?  What should I do if my blood  glucose is too high?  What should I do if my blood glucose is too low?  What should I do if I forget to take my insulin?  What number should I call if I have questions? Where to find more information  American Diabetes Association (ADA): www.diabetes.org  American Association of Diabetes Educators (AADE) Patient Resources: https://www.diabeteseducator.org Summary  A subcutaneous injection is a shot of medicine that is injected into the layer of fat and tissue between skin and muscle.  Before you give yourself an insulin injection, be sure to test your blood sugar level (blood glucose level) and write down that number.  Check the expiration date and the type of  insulin that is in the pen. The type of insulin that you take may determine how many injections you give yourself and when you need to give the injections.  It is best to inject insulin into the same body area each time (for example, always in the abdomen), but you should use a different spot in that area for each injection. This information is not intended to replace advice given to you by your health care provider. Make sure you discuss any questions you have with your health care provider. Document Released: 11/23/2015 Document Revised: 11/09/2017 Document Reviewed: 11/23/2015 Elsevier Interactive Patient Education  2019 [employer]   Hypoglycemia Hypoglycemia is when the sugar (glucose) level in your blood is too low. Signs of low blood sugar may include:  Feeling: ? Hungry. ? Worried or nervous (anxious). ? Sweaty and clammy. ? Confused. ? Dizzy. ? Sleepy. ? Sick to your stomach (nauseous).  Having: ? A fast heartbeat. ? A headache. ? A change in your vision. ? Tingling or no feeling (numbness) around your mouth, lips, or tongue. ? Jerky movements that you cannot control (seizure).  Having trouble with: ? Moving (coordination). ? Sleeping. ? Passing out (fainting). ? Getting upset easily  (irritability). Low blood sugar can happen to people who have diabetes and people who do not have diabetes. Low blood sugar can happen quickly, and it can be an emergency. Treating low blood sugar Low blood sugar is often treated by eating or drinking something sugary right away, such as:  Fruit juice, 4-6 oz (120-150 mL).  Regular soda (not diet soda), 4-6 oz (120-150 mL).  Low-fat milk, 4 oz (120 mL).  Several pieces of hard candy.  Sugar or honey, 1 Tbsp (15 mL). Treating low blood sugar if you have diabetes If you can think clearly and swallow safely, follow the 15:15 rule:  Take 15 grams of a fast-acting carb (carbohydrate). Talk with your doctor about how much you should take.  Always keep a source of fast-acting carb with you, such as: ? Sugar tablets (glucose pills). Take 3-4 pills. ? 6-8 pieces of hard candy. ? 4-6 oz (120-150 mL) of fruit juice. ? 4-6 oz (120-150 mL) of regular (not diet) soda. ? 1 Tbsp (15 mL) honey or sugar.  Check your blood sugar 15 minutes after you take the carb.  If your blood sugar is still at or below 70 mg/dL (3.9 mmol/L), take 15 grams of a carb again.  If your blood sugar does not go above 70 mg/dL (3.9 mmol/L) after 3 tries, get help right away.  After your blood sugar goes back to normal, eat a meal or a snack within 1 hour.  Treating very low blood sugar If your blood sugar is at or below 54 mg/dL (3 mmol/L), you have very low blood sugar (severe hypoglycemia). This may also cause:  Passing out.  Jerky movements you cannot control (seizure).  Losing consciousness (coma). This is an emergency. Do not wait to see if the symptoms will go away. Get medical help right away. Call your local emergency services (911 in the [place] Do not drive yourself to the hospital. If you have very low blood sugar and you cannot eat or drink, you may need a glucagon shot (injection). A family member or friend should learn how to check your blood sugar  and how to give you a glucagon shot. Ask your doctor if you need to have a glucagon shot kit at home. Follow these instructions at home:  General instructions  Take over-the-counter and prescription medicines only as told by your doctor.  Stay aware of your blood sugar as told by your doctor.  Limit alcohol intake to no more than 1 drink a day for nonpregnant women and 2 drinks a day for men. One drink equals 12 oz of beer (355 mL), 5 oz of wine (148 mL), or 1 oz of hard liquor (44 mL).  Keep all follow-up visits as told by your doctor. This is important. If you have diabetes:   Follow your diabetes care plan as told by your doctor. Make sure you: ? Know the signs of low blood sugar. ? Take your medicines as told. ? Follow your exercise and meal plan. ? Eat on time. Do not skip meals. ? Check your blood sugar as often as told by your doctor. Always check it before and after exercise. ? Follow your sick day plan when you cannot eat or drink normally. Make this plan ahead of time with your doctor.  Share your diabetes care plan with: ? Your work or school. ? People you live with.  Check your pee (urine) for ketones: ? When you are sick. ? As told by your doctor.  Carry a card or wear jewelry that says you have diabetes. Contact a doctor if:  You have trouble keeping your blood sugar in your target range.  You have low blood sugar often. Get help right away if:  You still have symptoms after you eat or drink something sugary.  Your blood sugar is at or below 54 mg/dL (3 mmol/L).  You have jerky movements that you cannot control.  You pass out. These symptoms may be an emergency. Do not wait to see if the symptoms will go away. Get medical help right away. Call your local emergency services (911 in the [place] Do not drive yourself to the hospital. Summary  Hypoglycemia happens when the level of sugar (glucose) in your blood is too low.  Low blood sugar can happen to  people who have diabetes and people who do not have diabetes. Low blood sugar can happen quickly, and it can be an emergency.  Make sure you know the signs of low blood sugar and know how to treat it.  Always keep a source of sugar (fast-acting carb) with you to treat low blood sugar. This information is not intended to replace advice given to you by your health care provider. Make sure you discuss any questions you have with your health care provider. Document Released: 01/14/2010 Document Revised: 04/13/2018 Document Reviewed: 11/23/2015 Elsevier Interactive Patient Education  2019 [employer].   Type 2 Diabetes Mellitus, Self Care, Adult When you have type 2 diabetes (type 2 diabetes mellitus), you must make sure your blood sugar (glucose) stays in a healthy range. You can do this with:  Nutrition.  Exercise.  Lifestyle changes.  Medicines or insulin, if needed.  Support from your doctors and others. How to stay aware of blood sugar   Check your blood sugar level every day, as often as told.  Have your A1c (hemoglobin A1c) level checked two or more times a year. Have it checked more often if your doctor tells you to. Your doctor will set personal treatment goals for you. Generally, you should have these blood sugar levels:  Before meals (preprandial): 80-130 mg/dL (4.4-7.2 mmol/L).  After meals (postprandial): below 180 mg/dL (10 mmol/L).  A1c level: less than 7%. How to manage high and low blood sugar Signs  of high blood sugar High blood sugar is called hyperglycemia. Know the signs of high blood sugar. Signs may include:  Feeling: ? Thirsty. ? Hungry. ? Very tired.  Needing to pee (urinate) more than usual.  Blurry vision. Signs of low blood sugar Low blood sugar is called hypoglycemia. This is when blood sugar is at or below 70 mg/dL (3.9 mmol/L). Signs may include:  Feeling: ? Hungry. ? Worried or nervous (anxious). ? Sweaty and  clammy. ? Confused. ? Dizzy. ? Sleepy. ? Sick to your stomach (nauseous).  Having: ? A fast heartbeat. ? A headache. ? A change in your vision. ? Jerky movements that you cannot control (seizure). ? Tingling or no feeling (numbness) around your mouth, lips, or tongue.  Having trouble with: ? Moving (coordination). ? Sleeping. ? Passing out (fainting). ? Getting upset easily (irritability). Treating low blood sugar To treat low blood sugar, eat or drink something sugary right away. If you can think clearly and swallow safely, follow the 15:15 rule:  Take 15 grams of a fast-acting carb (carbohydrate). Talk with your doctor about how much you should take.  Some fast-acting carbs are: ? Sugar tablets (glucose pills). Take 3-4 pills. ? 6-8 pieces of hard candy. ? 4-6 oz (120-150 mL) of fruit juice. ? 4-6 oz (120-150 mL) of regular (not diet) soda. ? 1 Tbsp (15 mL) honey or sugar.  Check your blood sugar 15 minutes after you take the carb.  If your blood sugar is still at or below 70 mg/dL (3.9 mmol/L), take 15 grams of a carb again.  If your blood sugar does not go above 70 mg/dL (3.9 mmol/L) after 3 tries, get help right away.  After your blood sugar goes back to normal, eat a meal or a snack within 1 hour. Treating very low blood sugar If your blood sugar is at or below 54 mg/dL (3 mmol/L), you have very low blood sugar (severe hypoglycemia). This is an emergency. Do not wait to see if the symptoms will go away. Get medical help right away. Call your local emergency services (911 in the [place] If you have very low blood sugar and you cannot eat or drink, you may need a glucagon shot (injection). A family member or friend should learn how to check your blood sugar and how to give you a glucagon shot. Ask your doctor if you need to have a glucagon shot kit at home. Follow these instructions at home: Medicine  Take insulin and diabetes medicines as told.  If your doctor says  you should take more or less insulin and medicines, do this exactly as told.  Do not run out of insulin or medicines. Having diabetes can raise your risk for other long-term conditions. These include heart disease and kidney disease. Your doctor may prescribe medicines to help you not have these problems. Food   Make healthy food choices. These include: ? Chicken, fish, egg whites, and beans. ? Oats, whole wheat, bulgur, brown rice, quinoa, and millet. ? Fresh fruits and vegetables. ? Low-fat dairy products. ? Nuts, avocado, olive oil, and canola oil.  Meet with a food specialist (dietitian). He or she can help you make an eating plan that is right for you.  Follow instructions from your doctor about what you cannot eat or drink.  Drink enough fluid to keep your pee (urine) pale yellow.  Keep track of carbs that you eat. Do this by reading food labels and learning food serving sizes.  Follow  your sick day plan when you cannot eat or drink normally. Make this plan with your doctor so it is ready to use. Activity  Exercise 3 or more times a week.  Do not go more than 2 days without exercising.  Talk with your doctor before you start a new exercise. Your doctor may need to tell you to change: ? How much insulin or medicines you take. ? How much food you eat. Lifestyle  Do not use any tobacco products. These include cigarettes, chewing tobacco, and e-cigarettes. If you need help quitting, ask your doctor.  Ask your doctor how much alcohol is safe for you.  Learn to deal with stress. If you need help with this, ask your doctor. Body care   Stay up to date with your shots (immunizations).  Have your eyes and feet checked by a doctor as often as told.  Check your skin and feet every day. Check for cuts, bruises, redness, blisters, or sores.  Brush your teeth and gums two times a day. Floss one or more times a day.  Go to the dentist one or more times every 6 months.  Stay  at a healthy weight. General instructions  Take over-the-counter and prescription medicines only as told by your doctor.  Share your diabetes care plan with: ? Your work or school. ? People you live with.  Carry a card or wear jewelry that says you have diabetes.  Keep all follow-up visits as told by your doctor. This is important. Questions to ask your doctor  Do I need to meet with a diabetes educator?  Where can I find a support group for people with diabetes? Where to find more information To learn more about diabetes, visit:  American Diabetes Association: www.diabetes.org  American Association of Diabetes Educators: www.diabeteseducator.org Summary  When you have type 2 diabetes, you must make sure your blood sugar (glucose) stays in a healthy range.  Check your blood sugar every day, as often as told.  Having diabetes can raise your risk for other conditions. Your doctor may prescribe medicines to help you not have these problems.  Keep all follow-up visits as told by your doctor. This is important. This information is not intended to replace advice given to you by your health care provider. Make sure you discuss any questions you have with your health care provider. Document Released: 02/11/2016 Document Revised: 04/12/2018 Document Reviewed: 11/23/2015 Elsevier Interactive Patient Education  2019 [place] 65 Years and Older, Male Preventive care refers to lifestyle choices and visits with your health care provider that can promote health and wellness. What does preventive care include?   A yearly physical exam. This is also called an annual well check.  Dental exams once or twice a year.  Routine eye exams. Ask your health care provider how often you should have your eyes checked.  Personal lifestyle choices, including: ? Daily care of your teeth and gums. ? Regular physical activity. ? Eating a healthy diet. ? Avoiding tobacco  and drug use. ? Limiting alcohol use. ? Practicing safe sex. ? Taking low doses of aspirin every day. ? Taking vitamin and mineral supplements as recommended by your health care provider. What happens during an annual well check? The services and screenings done by your health care provider during your annual well check will depend on your age, overall health, lifestyle risk factors, and family history of disease. Counseling Your health care provider may ask you questions about  your:  Alcohol use.  Tobacco use.  Drug use.  Emotional well-being.  Home and relationship well-being.  Sexual activity.  Eating habits.  History of falls.  Memory and ability to understand (cognition).  Work and work Statistician. Screening You may have the following tests or measurements:  Height, weight, and BMI.  Blood pressure.  Lipid and cholesterol levels. These may be checked every 5 years, or more frequently if you are over 80 years old.  Skin check.  Lung cancer screening. You may have this screening every year starting at age 73 if you have a 30-pack-year history of smoking and currently smoke or have quit within the past 15 years.  Colorectal cancer screening. All adults should have this screening starting at age 73 and continuing until age 44. You will have tests every 1-10 years, depending on your results and the type of screening test. People at increased risk should start screening at an earlier age. Screening tests may include: ? Guaiac-based fecal occult blood testing. ? Fecal immunochemical test (FIT). ? Stool DNA test. ? Virtual colonoscopy. ? Sigmoidoscopy. During this test, a flexible tube with a tiny camera (sigmoidoscope) is used to examine your rectum and lower colon. The sigmoidoscope is inserted through your anus into your rectum and lower colon. ? Colonoscopy. During this test, a long, thin, flexible tube with a tiny camera (colonoscope) is used to examine your entire  colon and rectum.  Prostate cancer screening. Recommendations will vary depending on your family history and other risks.  Hepatitis C blood test.  Hepatitis B blood test.  Sexually transmitted disease (STD) testing.  Diabetes screening. This is done by checking your blood sugar (glucose) after you have not eaten for a while (fasting). You may have this done every 1-3 years.  Abdominal aortic aneurysm (AAA) screening. You may need this if you are a current or former smoker.  Osteoporosis. You may be screened starting at age 63 if you are at high risk. Talk with your health care provider about your test results, treatment options, and if necessary, the need for more tests. Vaccines Your health care provider may recommend certain vaccines, such as:  Influenza vaccine. This is recommended every year.  Tetanus, diphtheria, and acellular pertussis (Tdap, Td) vaccine. You may need a Td booster every 10 years.  Varicella vaccine. You may need this if you have not been vaccinated.  Zoster vaccine. You may need this after age 31.  Measles, mumps, and rubella (MMR) vaccine. You may need at least one dose of MMR if you were born in 1957 or later. You may also need a second dose.  Pneumococcal 13-valent conjugate (PCV13) vaccine. One dose is recommended after age 108.  Pneumococcal polysaccharide (PPSV23) vaccine. One dose is recommended after age 42.  Meningococcal vaccine. You may need this if you have certain conditions.  Hepatitis A vaccine. You may need this if you have certain conditions or if you travel or work in places where you may be exposed to hepatitis A.  Hepatitis B vaccine. You may need this if you have certain conditions or if you travel or work in places where you may be exposed to hepatitis B.  Haemophilus influenzae type b (Hib) vaccine. You may need this if you have certain risk factors. Talk to your health care provider about which screenings and vaccines you need and  how often you need them. This information is not intended to replace advice given to you by your health care provider. Make sure  you discuss any questions you have with your health care provider. Document Released: 11/16/2015 Document Revised: 12/10/2017 Document Reviewed: 08/21/2015 Elsevier Interactive Patient Education  [employer].   If you have lab work done today you will be contacted with your lab results within the next 2 weeks.  If you have not heard from [place] then please contact us. The fastest way to get your results is to register for My Chart.   IF you received an x-ray today, you will receive an invoice from [facility]. Please contact [facility] at [phone] with questions or concerns regarding your invoice.   IF you received labwork today, you will receive an invoice from [place]. Please contact [facility] at [phone] with questions or concerns regarding your invoice.   Our billing staff will not be able to assist you with questions regarding bills from these companies.  You will be contacted with the lab results as soon as they are available. The fastest way to get your results is to activate your My Chart account. Instructions are located on the last page of this paperwork. If you have [id number] heard from [place] regarding the results in 2 weeks, please contact this office.

## 2018-12-07 NOTE — Progress Notes (Signed)
Subjective:    Patient ID: Edward Briggs, male    DOB: 04-19-1951, 68 y.o.   MRN: [id number]  HPI Edward Briggs is a 68 y.o. male Presents today for: Chief Complaint  Patient presents with  . Annual Exam    last physical done here was 04/23/17  . Groin Swelling    swelling around the tip of penis x 1-2 weeks   Presents for wellness exam as well as follow-up of routine medical issues as well as acute issue as above.  I saw him last September for acute illness.  Diabetes:  He has taken metformin 500 mg twice daily previously. Has missed doses at times - every few days misses a dose or the whole day. Does have a pill box.  Not on statin.  Has been on lisinopril 5 mg daily.  He was advised to follow-up in 3 months when seen in January of last year. Microalbumin: Normal in June 2018 Optho, foot exam, pneumovax: Due for optho exam.  Foot exam September 2019.  Due for Pneumovax Increased thirst/some inc urination. No n/v/abd pain/blurry vision.   Lab Results  Component Value Date   HGBA1C 9.0 (H) 11/27/2017   HGBA1C 8.0 (H) 04/23/2017   HGBA1C 7.70f02/23/2017   Lab Results  Component Value Date   MICROALBUR 0.7 02/20/2015   LDLCALC 66 11/27/2017   CREATININE 0.89 11/27/2017    . Hypertension: BP Readings from Last 3 Encounters:  12/07/18 132/82  11/17/18 114/70  07/13/18 137/88  Taking lisinopril 5 mg daily, occasional missed dose.  Lab Results  Component Value Date   CREATININE 0.89 11/27/2017   Penile swelling: Started 1-2 weeks ago. No d/c. Area is sore. No ulcers/blisters. Uncircumcised. Some difficulty retracting skin. spraying with urination d/t swelling of foreskin. Erection few times with ability to retract /replace foreskin. Few times of white d/c.  Tx: lotion.   Cancer screen: Overdue for colon cancer screening. PSA normal at 0.3 in June 2018  Immunization History  Administered Date(s) Administered  . Pneumococcal Conjugate-13 04/23/2017   Fall  screen: No falls in past year.  Depression screen:    Functional Status Survey: Is the patient deaf or have difficulty hearing?: No Does the patient have difficulty seeing, even when wearing glasses/contacts?: No Does the patient have difficulty concentrating, remembering, or making decisions?: Yes(little forgetful) Does the patient have difficulty walking or climbing stairs?: No Does the patient have difficulty dressing or bathing?: No Does the patient have difficulty doing errands alone such as visiting a doctor's office or shopping?: No  Memory screen 6CIT Screen 12/07/2018  What Year? 0 points  What time? 0 points  Count back from 20 0 points  Months in reverse 0 points  Repeat phrase 0 points   Vision:  Visual Acuity Screening   Right eye Left eye Both eyes  Without correction: 20/30 20/30 20/30  With correction:     no recent visit. Agrees to referral.   Dental: both dentures and natural teeth, no recent visit.   Exercise: Walking and working in yard.  Wt Readings from Last 3 Encounters:  12/07/18 255 lb (115.7 kg)  07/13/18 257 lb 12.8 oz (116.9 kg)  11/27/17 261 lb (118.4 kg)    Advanced directives: Info provided.     Patient Active Problem List   Diagnosis Date Noted  . Type II or unspecified type diabetes mellitus without mention of complication, uncontrolled 09/10/2012  . GASTROESOPHAGEAL REFLUX DISEASE 07/21/2009  . CHEST PAIN 07/21/2009  Past Medical History:  Diagnosis Date  . Arthritis   . Diabetes mellitus without complication ([place])   . GERD (gastroesophageal reflux disease)    no meds   Past Surgical History:  Procedure Laterality Date  . ABSCESS DRAINAGE    . FRACTURE SURGERY    . HARDWARE REMOVAL  03/26/2012   Procedure: HARDWARE REMOVAL;  Surgeon: [name], MD;  Location: [facility];  Service: Orthopedics;  Laterality: Left;  hardware removal deep left ankle syndesmotic screws only and stress xrays ankle   . ORIF  ANKLE FRACTURE  12/03/2011   Procedure: OPEN REDUCTION INTERNAL FIXATION (ORIF) ANKLE FRACTURE;  Surgeon: [name], MD;  Location: [facility];  Service: Orthopedics;  Laterality: Left;  left medial malleolus fracture  . PILONIDAL CYST EXCISION  ~1982  . SKIN GRAFT  ~1970   Lt great toe (graft from Lt thigh)   No Known Allergies Prior to Admission medications   Medication Sig Start Date End Date Taking? Authorizing Provider  Ascorbic Acid (VITAMIN C PO) Take by mouth.   Yes [provider]  aspirin 81 MG tablet Take 81 mg by mouth daily.   Yes [provider]  Blood Glucose Monitoring Suppl (BLOOD GLUCOSE METER) kit Use as instructed 09/10/12  Yes [name], MD  lisinopril (PRINIVIL,ZESTRIL) 5 MG tablet TAKE 1 TABLET BY MOUTH EVERY DAY 06/01/18  Yes [name], MD  metFORMIN (GLUCOPHAGE) 500 MG tablet TAKE 1 TABLET BY MOUTH TWICE A DAY WITH A MEAL  Office visit needed 09/10/17  Yes [name], MD   Social History   Socioeconomic History  . Marital status: Married    Spouse name: Not on file  . Number of children: Not on file  . Years of education: Not on file  . Highest education level: Not on file  Occupational History  . Not on file  Social Needs  . Financial resource strain: Not on file  . Food insecurity:    Worry: Not on file    Inability: Not on file  . Transportation needs:    Medical: Not on file    Non-medical: Not on file  Tobacco Use  . Smoking status: Former Smoker    Last attempt to quit: 10/30/1988    Years since quitting: 30.1  . Smokeless tobacco: Never Used  Substance and Sexual Activity  . Alcohol use: Yes    Alcohol/week: 1.0 standard drinks    Types: 1 Shots of liquor per week    Comment: every two weeks  . Drug use: No  . Sexual activity: Yes    Birth control/protection: None  Lifestyle  . Physical activity:    Days per week: Not on file    Minutes per session: Not on file  . Stress: Not on  file  Relationships  . Social connections:    Talks on phone: Not on file    Gets together: Not on file    Attends religious service: Not on file    Active member of club or organization: Not on file    Attends meetings of clubs or organizations: Not on file    Relationship status: Not on file  . Intimate partner violence:    Fear of current or ex partner: Not on file    Emotionally abused: Not on file    Physically abused: Not on file    Forced sexual activity: Not on file  Other Topics Concern  . Not on  file  Social History Narrative  . Not on file   Review of Systems 13 point review of systems per patient health survey noted.  Negative other than as indicated above or in HPI.      Objective:   Physical Exam Vitals signs reviewed.  Constitutional:      Appearance: He is well-developed.  HENT:     Head: Normocephalic and atraumatic.     Right Ear: External ear normal.     Left Ear: External ear normal.  Eyes:     Conjunctiva/sclera: Conjunctivae normal.     Pupils: Pupils are equal, round, and reactive to light.  Neck:     Musculoskeletal: Normal range of motion and neck supple.     Thyroid: No thyromegaly.  Cardiovascular:     Rate and Rhythm: Normal rate and regular rhythm.     Heart sounds: Normal heart sounds.  Pulmonary:     Effort: Pulmonary effort is normal. No respiratory distress.     Breath sounds: Normal breath sounds. No wheezing.  Abdominal:     General: There is no distension.     Palpations: Abdomen is soft.     Tenderness: There is no abdominal tenderness.     Hernia: There is no hernia in the right inguinal area or left inguinal area.  Genitourinary:    Penis: Uncircumcised. Tenderness and swelling (distal foreskin with few fissures. no vesciles. min erythema at affected area. no penil head ttp, but unable to retract foreskin) present.      Prostate: Normal.  Musculoskeletal: Normal range of motion.        General: No tenderness.    Lymphadenopathy:     Cervical: No cervical adenopathy.  Skin:    General: Skin is warm and dry.  Neurological:     Mental Status: He is alert and oriented to person, place, and time.     Deep Tendon Reflexes: Reflexes are normal and symmetric.  Psychiatric:        Behavior: Behavior normal.    Vitals:   12/07/18 0810  BP: 132/82  Pulse: 72  Resp: 16  Temp: 98.4 F (36.9 C)  TempSrc: Oral  SpO2: 97%  Weight: 255 lb (115.7 kg)  Height: 6' 1" (1.854 m)    Results for orders placed or performed in visit on 12/07/18  POCT glycosylated hemoglobin (Hb A1C)  Result Value Ref Range   Hemoglobin A1C 14.0 (A) 4.0 - 5.6 %   HbA1c POC (<> result, manual entry)     HbA1c, POC (prediabetic range)     HbA1c, POC (controlled diabetic range)    POCT glucose (manual entry)  Result Value Ref Range   POC Glucose 376 (A) 70 - 99 mg/dl  POCT urinalysis dipstick  Result Value Ref Range   Color, UA yellow yellow   Clarity, UA clear clear   Glucose, UA =500 (A) negative mg/dL   Bilirubin, UA negative negative   Ketones, POC UA negative negative mg/dL   Spec Grav, UA 1.015 1.010 - 1.025   Blood, UA small (A) negative   pH, UA 5.5 5.0 - 8.0   Protein Ur, POC negative negative mg/dL   Urobilinogen, UA 0.2 0.2 or 1.0 E.U./dL   Nitrite, UA Negative Negative   Leukocytes, UA Small (1+) (A) Negative      Assessment & Plan:    Edward Briggs is a 68 y.o. male Annual physical exam - Plan: Comprehensive metabolic panel, Lipid Panel, PSA  - -anticipatory guidance as  below in AVS, screening labs above. Health maintenance items as above in HPI discussed/recommended as applicable.   Type 2 diabetes mellitus with hyperglycemia, without long-term current use of insulin (HCC) - Plan: Microalbumin/Creatinine Ratio, Urine, POCT glycosylated hemoglobin (Hb A1C), Ambulatory referral to Ophthalmology, POCT glucose (manual entry), POCT urinalysis dipstick, Insulin Glargine (LANTUS SOLOSTAR) 100 UNIT/ML  Solostar Pen, Insulin Pen Needle (PEN NEEDLES) 32G X 4 MM MISC, Ambulatory referral to diabetic education, CANCELED: Hemoglobin A1c Nonadherence to medication  -Unfortunately has been nonadherent/inconsistent with use of metformin and no recent follow-up for diabetes.  Uncontrolled at this time as above with A1c of 14 glucose in the 300s.  -Restart metformin, but additionally will start Lantus Solostar pen 10 units/day.  Handout given on use of insulin pens, but call back following day requesting assistance.  Referral placed to diabetes educator today.  Special screening for malignant neoplasms, colon - Plan: Ambulatory referral to Gastroenterology  Need for prophylactic vaccination against Streptococcus pneumoniae (pneumococcus) - Plan: Pneumococcal polysaccharide vaccine 23-valent greater than or equal to 2yo subcutaneous/IM  Balanoposthitis - Plan: fluconazole (DIFLUCAN) 150 MG tablet, hydrocortisone 1 % lotion  -Likely related to uncontrolled diabetes.  No suspicious for fungal cause at this time, but differential includes bacterial.   -Start with Diflucan 1501 then hydrocortisone lotion twice daily with recheck in 3 days.  ER precautions discussed if any acute worsening, or symptoms of paraphimosis.  Understanding expressed.  Meds ordered this encounter  Medications  . fluconazole (DIFLUCAN) 150 MG tablet    Sig: Take 1 tablet (150 mg total) by mouth once for 1 dose.    Dispense:  1 tablet    Refill:  0  . hydrocortisone 1 % lotion    Sig: Apply 1 application topically 2 (two) times daily.    Dispense:  118 mL    Refill:  0  . Insulin Glargine (LANTUS SOLOSTAR) 100 UNIT/ML Solostar Pen    Sig: Inject 10 Units into the skin daily.    Dispense:  5 pen    Refill:  1  . Insulin Pen Needle (PEN NEEDLES) 32G X 4 MM MISC    Sig: 1 application by Does not apply route daily.    Dispense:  100 each    Refill:  2   Patient Instructions    Memory test looked ok today, but if you notice  more memory problems, return for recheck and other testing.   Swelling at the foreskin of the penis is commonly due to fungal infection.  Take Diflucan that was prescribed today, hydrocortisone lotion twice per day to the irritated area to see if that will help with swelling and recheck in the next 3 to 4 days.  If at any point the foreskin is retracted and unable to replace the foreskin, go to emergency room.  Diabetes is uncontrolled. That is likely contributing to infection.  Use the pill box and setting an alarm can help with taking medications.  Start insulin once per day and recheck in 3 days with Dr. Nolon Rod.  Check blood sugars fasting and 2 hours after meal.   Watch for low blood sugar symptoms - see precautions below.     Insulin Injection Instructions, Using Insulin Pens, Adult A subcutaneous injection is a shot of medicine that is injected into the layer of fat and tissue between skin and muscle. People with type 1 diabetes must take insulin because their bodies do not make it. People with type 2 diabetes may need to take insulin.  There are many different types of insulin. The type of insulin that you take may determine how many injections you give yourself and when you need to give the injections. Supplies needed:  Soap and water to wash hands.  Your insulin pen.  A new, unused needle.  Alcohol wipes.  A disposal container that is meant for sharp items (sharps container), such as an empty plastic bottle with a cover. How to choose a site for injection The body absorbs insulin differently, depending on where the insulin is injected (injection site). It is best to inject insulin into the same body area each time (for example, always in the abdomen), but you should use a different spot in that area for each injection. Do not inject the insulin in the same spot each time. There are five main areas that can be used for injecting. These areas include:  Abdomen. This is the  preferred area.  Front of thigh.  Upper, outer side of thigh.  Upper, outer side of arm.  Upper, outer part of buttock. How to use an insulin pen  First, follow the steps for Get ready, then continue with the steps for Inject the insulin. Get ready 1. Wash your hands with soap and water. If soap and water are not available, use hand sanitizer. 2. Before you give yourself an insulin injection, be sure to test your blood sugar level (blood glucose level) and write down that number. Follow any instructions from your health care provider about what to do if your blood glucose level is higher or lower than your normal range. 3. Check the expiration date and the type of insulin that is in the pen. 4. If you are using CLEAR insulin, check to see that it is clear and free of clumps. 5. If you are using CLOUDY insulin, do not shake the pen to get the injection ready. Instead, get it ready in one of these ways: ? Gently roll the pen between your palms several times. ? Tip the pen up and down several times. 6. Remove the cap from the insulin pen. 7. Use an alcohol wipe to clean the rubber tip of the pen. 8. Remove the protective paper tab from the disposable needle. Do not let the needle touch anything. 9. Screw a new, unused needle onto the pen. 10. Remove the outer plastic needle cover. Do not throw away the outer plastic cover yet. ? If the pen uses a special safety needle, leave the inner needle shield in place. ? If the pen does not use a special safety needle, remove the inner plastic cover from the needle. 11. Follow the manufacturer's instructions to prime the insulin pen with the volume of insulin needed. Hold the pen with the needle pointing up, and push the button on the opposite end of the pen until a drop of insulin appears at the needle tip. If no insulin appears, repeat this step. 12. Turn the button (dial) to the number of units of insulin that you will be injecting. Inject the  insulin 1. Use an alcohol wipe to clean the site where you will be injecting the needle. Let the site air-dry. 2. Hold the pen in the palm of your writing hand like a pencil. 3. If directed by your health care provider, use your other hand to pinch and hold about an inch (2.5 cm) of skin at the injection site. Do not directly touch the cleaned part of the skin. 4. Gently but quickly, use your writing hand  to put the needle straight into the skin. The needle should be at a 90-degree angle (perpendicular) to the skin. 5. When the needle is completely inserted into the skin, use your thumb or index finger of your writing hand to push the top button of the pen down all the way to inject the insulin. 6. Let go of the skin that you are pinching. Continue to hold the pen in place with your writing hand. 7. Wait 10 seconds, then pull the needle straight out of the skin. This will allow all of the insulin to go from the pen and needle into your body. 8. Carefully put the larger (outer) plastic cover of the needle back over the needle, then unscrew the capped needle and discard it in a sharps container, such as an empty plastic bottle with a cover. 9. Put the plastic cap back on the insulin pen. How to throw away supplies  Discard all used needles in a puncture-proof sharps disposal container. You can ask your local pharmacy about where you can get this kind of disposal container, or you can use an empty plastic liquid laundry detergent bottle that has a cover.  Follow the disposal regulations for the area where you live. Do not use any needle more than one time.  Throw away empty disposable pens in the regular trash. Questions to ask your health care provider  How often should I be taking insulin?  How often should I check my blood glucose?  What amount of insulin should I be taking at each time?  What are the side effects?  What should I do if my blood glucose is too high?  What should I do if  my blood glucose is too low?  What should I do if I forget to take my insulin?  What number should I call if I have questions? Where to find more information  American Diabetes Association (ADA): www.diabetes.org  American Association of Diabetes Educators (AADE) Patient Resources: https://www.diabeteseducator.org Summary  A subcutaneous injection is a shot of medicine that is injected into the layer of fat and tissue between skin and muscle.  Before you give yourself an insulin injection, be sure to test your blood sugar level (blood glucose level) and write down that number.  Check the expiration date and the type of insulin that is in the pen. The type of insulin that you take may determine how many injections you give yourself and when you need to give the injections.  It is best to inject insulin into the same body area each time (for example, always in the abdomen), but you should use a different spot in that area for each injection. This information is not intended to replace advice given to you by your health care provider. Make sure you discuss any questions you have with your health care provider. Document Released: 11/23/2015 Document Revised: 11/09/2017 Document Reviewed: 11/23/2015 Elsevier Interactive Patient Education  2019 [employer]   Hypoglycemia Hypoglycemia is when the sugar (glucose) level in your blood is too low. Signs of low blood sugar may include:  Feeling: ? Hungry. ? Worried or nervous (anxious). ? Sweaty and clammy. ? Confused. ? Dizzy. ? Sleepy. ? Sick to your stomach (nauseous).  Having: ? A fast heartbeat. ? A headache. ? A change in your vision. ? Tingling or no feeling (numbness) around your mouth, lips, or tongue. ? Jerky movements that you cannot control (seizure).  Having trouble with: ? Moving (coordination). ? Sleeping. ? Passing out (  fainting). ? Getting upset easily (irritability). Low blood sugar can happen to people who  have diabetes and people who do not have diabetes. Low blood sugar can happen quickly, and it can be an emergency. Treating low blood sugar Low blood sugar is often treated by eating or drinking something sugary right away, such as:  Fruit juice, 4-6 oz (120-150 mL).  Regular soda (not diet soda), 4-6 oz (120-150 mL).  Low-fat milk, 4 oz (120 mL).  Several pieces of hard candy.  Sugar or honey, 1 Tbsp (15 mL). Treating low blood sugar if you have diabetes If you can think clearly and swallow safely, follow the 15:15 rule:  Take 15 grams of a fast-acting carb (carbohydrate). Talk with your doctor about how much you should take.  Always keep a source of fast-acting carb with you, such as: ? Sugar tablets (glucose pills). Take 3-4 pills. ? 6-8 pieces of hard candy. ? 4-6 oz (120-150 mL) of fruit juice. ? 4-6 oz (120-150 mL) of regular (not diet) soda. ? 1 Tbsp (15 mL) honey or sugar.  Check your blood sugar 15 minutes after you take the carb.  If your blood sugar is still at or below 70 mg/dL (3.9 mmol/L), take 15 grams of a carb again.  If your blood sugar does not go above 70 mg/dL (3.9 mmol/L) after 3 tries, get help right away.  After your blood sugar goes back to normal, eat a meal or a snack within 1 hour.  Treating very low blood sugar If your blood sugar is at or below 54 mg/dL (3 mmol/L), you have very low blood sugar (severe hypoglycemia). This may also cause:  Passing out.  Jerky movements you cannot control (seizure).  Losing consciousness (coma). This is an emergency. Do not wait to see if the symptoms will go away. Get medical help right away. Call your local emergency services (911 in the [place] Do not drive yourself to the hospital. If you have very low blood sugar and you cannot eat or drink, you may need a glucagon shot (injection). A family member or friend should learn how to check your blood sugar and how to give you a glucagon shot. Ask your doctor if you  need to have a glucagon shot kit at home. Follow these instructions at home: General instructions  Take over-the-counter and prescription medicines only as told by your doctor.  Stay aware of your blood sugar as told by your doctor.  Limit alcohol intake to no more than 1 drink a day for nonpregnant women and 2 drinks a day for men. One drink equals 12 oz of beer (355 mL), 5 oz of wine (148 mL), or 1 oz of hard liquor (44 mL).  Keep all follow-up visits as told by your doctor. This is important. If you have diabetes:   Follow your diabetes care plan as told by your doctor. Make sure you: ? Know the signs of low blood sugar. ? Take your medicines as told. ? Follow your exercise and meal plan. ? Eat on time. Do not skip meals. ? Check your blood sugar as often as told by your doctor. Always check it before and after exercise. ? Follow your sick day plan when you cannot eat or drink normally. Make this plan ahead of time with your doctor.  Share your diabetes care plan with: ? Your work or school. ? People you live with.  Check your pee (urine) for ketones: ? When you are sick. ?  As told by your doctor.  Carry a card or wear jewelry that says you have diabetes. Contact a doctor if:  You have trouble keeping your blood sugar in your target range.  You have low blood sugar often. Get help right away if:  You still have symptoms after you eat or drink something sugary.  Your blood sugar is at or below 54 mg/dL (3 mmol/L).  You have jerky movements that you cannot control.  You pass out. These symptoms may be an emergency. Do not wait to see if the symptoms will go away. Get medical help right away. Call your local emergency services (911 in the [place] Do not drive yourself to the hospital. Summary  Hypoglycemia happens when the level of sugar (glucose) in your blood is too low.  Low blood sugar can happen to people who have diabetes and people who do not have diabetes.  Low blood sugar can happen quickly, and it can be an emergency.  Make sure you know the signs of low blood sugar and know how to treat it.  Always keep a source of sugar (fast-acting carb) with you to treat low blood sugar. This information is not intended to replace advice given to you by your health care provider. Make sure you discuss any questions you have with your health care provider. Document Released: 01/14/2010 Document Revised: 04/13/2018 Document Reviewed: 11/23/2015 Elsevier Interactive Patient Education  2019 [place].   Type 2 Diabetes Mellitus, Self Care, Adult When you have type 2 diabetes (type 2 diabetes mellitus), you must make sure your blood sugar (glucose) stays in a healthy range. You can do this with:  Nutrition.  Exercise.  Lifestyle changes.  Medicines or insulin, if needed.  Support from your doctors and others. How to stay aware of blood sugar   Check your blood sugar level every day, as often as told.  Have your A1c (hemoglobin A1c) level checked two or more times a year. Have it checked more often if your doctor tells you to. Your doctor will set personal treatment goals for you. Generally, you should have these blood sugar levels:  Before meals (preprandial): 80-130 mg/dL (4.4-7.2 mmol/L).  After meals (postprandial): below 180 mg/dL (10 mmol/L).  A1c level: less than 7%. How to manage high and low blood sugar Signs of high blood sugar High blood sugar is called hyperglycemia. Know the signs of high blood sugar. Signs may include:  Feeling: ? Thirsty. ? Hungry. ? Very tired.  Needing to pee (urinate) more than usual.  Blurry vision. Signs of low blood sugar Low blood sugar is called hypoglycemia. This is when blood sugar is at or below 70 mg/dL (3.9 mmol/L). Signs may include:  Feeling: ? Hungry. ? Worried or nervous (anxious). ? Sweaty and clammy. ? Confused. ? Dizzy. ? Sleepy. ? Sick to your stomach  (nauseous).  Having: ? A fast heartbeat. ? A headache. ? A change in your vision. ? Jerky movements that you cannot control (seizure). ? Tingling or no feeling (numbness) around your mouth, lips, or tongue.  Having trouble with: ? Moving (coordination). ? Sleeping. ? Passing out (fainting). ? Getting upset easily (irritability). Treating low blood sugar To treat low blood sugar, eat or drink something sugary right away. If you can think clearly and swallow safely, follow the 15:15 rule:  Take 15 grams of a fast-acting carb (carbohydrate). Talk with your doctor about how much you should take.  Some fast-acting carbs are: ? Sugar tablets (glucose pills).  Take 3-4 pills. ? 6-8 pieces of hard candy. ? 4-6 oz (120-150 mL) of fruit juice. ? 4-6 oz (120-150 mL) of regular (not diet) soda. ? 1 Tbsp (15 mL) honey or sugar.  Check your blood sugar 15 minutes after you take the carb.  If your blood sugar is still at or below 70 mg/dL (3.9 mmol/L), take 15 grams of a carb again.  If your blood sugar does not go above 70 mg/dL (3.9 mmol/L) after 3 tries, get help right away.  After your blood sugar goes back to normal, eat a meal or a snack within 1 hour. Treating very low blood sugar If your blood sugar is at or below 54 mg/dL (3 mmol/L), you have very low blood sugar (severe hypoglycemia). This is an emergency. Do not wait to see if the symptoms will go away. Get medical help right away. Call your local emergency services (911 in the [place] If you have very low blood sugar and you cannot eat or drink, you may need a glucagon shot (injection). A family member or friend should learn how to check your blood sugar and how to give you a glucagon shot. Ask your doctor if you need to have a glucagon shot kit at home. Follow these instructions at home: Medicine  Take insulin and diabetes medicines as told.  If your doctor says you should take more or less insulin and medicines, do this exactly  as told.  Do not run out of insulin or medicines. Having diabetes can raise your risk for other long-term conditions. These include heart disease and kidney disease. Your doctor may prescribe medicines to help you not have these problems. Food   Make healthy food choices. These include: ? Chicken, fish, egg whites, and beans. ? Oats, whole wheat, bulgur, brown rice, quinoa, and millet. ? Fresh fruits and vegetables. ? Low-fat dairy products. ? Nuts, avocado, olive oil, and canola oil.  Meet with a food specialist (dietitian). He or she can help you make an eating plan that is right for you.  Follow instructions from your doctor about what you cannot eat or drink.  Drink enough fluid to keep your pee (urine) pale yellow.  Keep track of carbs that you eat. Do this by reading food labels and learning food serving sizes.  Follow your sick day plan when you cannot eat or drink normally. Make this plan with your doctor so it is ready to use. Activity  Exercise 3 or more times a week.  Do not go more than 2 days without exercising.  Talk with your doctor before you start a new exercise. Your doctor may need to tell you to change: ? How much insulin or medicines you take. ? How much food you eat. Lifestyle  Do not use any tobacco products. These include cigarettes, chewing tobacco, and e-cigarettes. If you need help quitting, ask your doctor.  Ask your doctor how much alcohol is safe for you.  Learn to deal with stress. If you need help with this, ask your doctor. Body care   Stay up to date with your shots (immunizations).  Have your eyes and feet checked by a doctor as often as told.  Check your skin and feet every day. Check for cuts, bruises, redness, blisters, or sores.  Brush your teeth and gums two times a day. Floss one or more times a day.  Go to the dentist one or more times every 6 months.  Stay at a healthy weight. General  instructions  Take over-the-counter  and prescription medicines only as told by your doctor.  Share your diabetes care plan with: ? Your work or school. ? People you live with.  Carry a card or wear jewelry that says you have diabetes.  Keep all follow-up visits as told by your doctor. This is important. Questions to ask your doctor  Do I need to meet with a diabetes educator?  Where can I find a support group for people with diabetes? Where to find more information To learn more about diabetes, visit:  American Diabetes Association: www.diabetes.org  American Association of Diabetes Educators: www.diabeteseducator.org Summary  When you have type 2 diabetes, you must make sure your blood sugar (glucose) stays in a healthy range.  Check your blood sugar every day, as often as told.  Having diabetes can raise your risk for other conditions. Your doctor may prescribe medicines to help you not have these problems.  Keep all follow-up visits as told by your doctor. This is important. This information is not intended to replace advice given to you by your health care provider. Make sure you discuss any questions you have with your health care provider. Document Released: 02/11/2016 Document Revised: 04/12/2018 Document Reviewed: 11/23/2015 Elsevier Interactive Patient Education  2019 [place] 65 Years and Older, Male Preventive care refers to lifestyle choices and visits with your health care provider that can promote health and wellness. What does preventive care include?   A yearly physical exam. This is also called an annual well check.  Dental exams once or twice a year.  Routine eye exams. Ask your health care provider how often you should have your eyes checked.  Personal lifestyle choices, including: ? Daily care of your teeth and gums. ? Regular physical activity. ? Eating a healthy diet. ? Avoiding tobacco and drug use. ? Limiting alcohol use. ? Practicing safe  sex. ? Taking low doses of aspirin every day. ? Taking vitamin and mineral supplements as recommended by your health care provider. What happens during an annual well check? The services and screenings done by your health care provider during your annual well check will depend on your age, overall health, lifestyle risk factors, and family history of disease. Counseling Your health care provider may ask you questions about your:  Alcohol use.  Tobacco use.  Drug use.  Emotional well-being.  Home and relationship well-being.  Sexual activity.  Eating habits.  History of falls.  Memory and ability to understand (cognition).  Work and work Statistician. Screening You may have the following tests or measurements:  Height, weight, and BMI.  Blood pressure.  Lipid and cholesterol levels. These may be checked every 5 years, or more frequently if you are over 65 years old.  Skin check.  Lung cancer screening. You may have this screening every year starting at age 29 if you have a 30-pack-year history of smoking and currently smoke or have quit within the past 15 years.  Colorectal cancer screening. All adults should have this screening starting at age 6 and continuing until age 29. You will have tests every 1-10 years, depending on your results and the type of screening test. People at increased risk should start screening at an earlier age. Screening tests may include: ? Guaiac-based fecal occult blood testing. ? Fecal immunochemical test (FIT). ? Stool DNA test. ? Virtual colonoscopy. ? Sigmoidoscopy. During this test, a flexible tube with a tiny camera (sigmoidoscope) is used to examine your  rectum and lower colon. The sigmoidoscope is inserted through your anus into your rectum and lower colon. ? Colonoscopy. During this test, a long, thin, flexible tube with a tiny camera (colonoscope) is used to examine your entire colon and rectum.  Prostate cancer screening.  Recommendations will vary depending on your family history and other risks.  Hepatitis C blood test.  Hepatitis B blood test.  Sexually transmitted disease (STD) testing.  Diabetes screening. This is done by checking your blood sugar (glucose) after you have not eaten for a while (fasting). You may have this done every 1-3 years.  Abdominal aortic aneurysm (AAA) screening. You may need this if you are a current or former smoker.  Osteoporosis. You may be screened starting at age 71 if you are at high risk. Talk with your health care provider about your test results, treatment options, and if necessary, the need for more tests. Vaccines Your health care provider may recommend certain vaccines, such as:  Influenza vaccine. This is recommended every year.  Tetanus, diphtheria, and acellular pertussis (Tdap, Td) vaccine. You may need a Td booster every 10 years.  Varicella vaccine. You may need this if you have not been vaccinated.  Zoster vaccine. You may need this after age 62.  Measles, mumps, and rubella (MMR) vaccine. You may need at least one dose of MMR if you were born in 1957 or later. You may also need a second dose.  Pneumococcal 13-valent conjugate (PCV13) vaccine. One dose is recommended after age 65.  Pneumococcal polysaccharide (PPSV23) vaccine. One dose is recommended after age 67.  Meningococcal vaccine. You may need this if you have certain conditions.  Hepatitis A vaccine. You may need this if you have certain conditions or if you travel or work in places where you may be exposed to hepatitis A.  Hepatitis B vaccine. You may need this if you have certain conditions or if you travel or work in places where you may be exposed to hepatitis B.  Haemophilus influenzae type b (Hib) vaccine. You may need this if you have certain risk factors. Talk to your health care provider about which screenings and vaccines you need and how often you need them. This information is  not intended to replace advice given to you by your health care provider. Make sure you discuss any questions you have with your health care provider. Document Released: 11/16/2015 Document Revised: 12/10/2017 Document Reviewed: 08/21/2015 Elsevier Interactive Patient Education  [employer].   If you have lab work done today you will be contacted with your lab results within the next 2 weeks.  If you have not heard from [place] then please contact us. The fastest way to get your results is to register for My Chart.   IF you received an x-ray today, you will receive an invoice from [facility]. Please contact [facility] at [phone] with questions or concerns regarding your invoice.   IF you received labwork today, you will receive an invoice from [place]. Please contact [facility] at [phone] with questions or concerns regarding your invoice.   Our billing staff will not be able to assist you with questions regarding bills from these companies.  You will be contacted with the lab results as soon as they are available. The fastest way to get your results is to activate your My Chart account. Instructions are located on the last page of this paperwork. If you have not heard from [place] regarding the results in 2 weeks, please contact  this office.       Signed,   [name], MD [facility].  12/08/18 8:52 AM

## 2018-12-08 ENCOUNTER — Encounter: Payer: Medicare Other | Attending: Family Medicine | Admitting: *Deleted

## 2018-12-08 DIAGNOSIS — E119 Type 2 diabetes mellitus without complications: Secondary | ICD-10-CM

## 2018-12-08 LAB — COMPREHENSIVE METABOLIC PANEL
A/G RATIO: 1.4 (ref 1.2–2.2)
ALBUMIN: 4.1 g/dL (ref 3.8–4.8)
ALT: 14 IU/L (ref 0–44)
AST: 13 IU/L (ref 0–40)
Alkaline Phosphatase: 88 IU/L (ref 39–117)
BILIRUBIN TOTAL: 0.5 mg/dL (ref 0.0–1.2)
BUN / CREAT RATIO: 10 (ref 10–24)
BUN: 10 mg/dL (ref 8–27)
CHLORIDE: 97 mmol/L (ref 96–106)
CO2: 20 mmol/L (ref 20–29)
Calcium: 9.2 mg/dL (ref 8.6–10.2)
Creatinine, Ser: 0.98 mg/dL (ref 0.76–1.27)
GFR calc Af Amer: 92 mL/min/{1.73_m2} (ref >59)
GFR, EST NON AFRICAN AMERICAN: 79 mL/min/{1.73_m2} (ref >59)
Globulin, Total: 3 g/dL (ref 1.5–4.5)
Glucose: 365 mg/dL — ABNORMAL HIGH (ref 65–99)
Potassium: 4.4 mmol/L (ref 3.5–5.2)
Sodium: 136 mmol/L (ref 134–144)
TOTAL PROTEIN: 7.1 g/dL (ref 6.0–8.5)

## 2018-12-08 LAB — MICROALBUMIN / CREATININE URINE RATIO
CREATININE, UR: 135.1 mg/dL
MICROALB/CREAT RATIO: 14 mg/g{creat} (ref 0–29)
MICROALBUM., U, RANDOM: 18.7 ug/mL

## 2018-12-08 LAB — HEMOGLOBIN A1C
Est. average glucose Bld gHb Est-mCnc: 332 mg/dL
Hgb A1c MFr Bld: 13.2 % — ABNORMAL HIGH (ref 4.8–5.6)

## 2018-12-08 LAB — LIPID PANEL
CHOLESTEROL TOTAL: 146 mg/dL (ref 100–199)
Chol/HDL Ratio: 3 ratio (ref 0.0–5.0)
HDL: 49 mg/dL (ref >39)
LDL Calculated: 77 mg/dL (ref 0–99)
TRIGLYCERIDES: 98 mg/dL (ref 0–149)
VLDL CHOLESTEROL CAL: 20 mg/dL (ref 5–40)

## 2018-12-08 LAB — PSA: PROSTATE SPECIFIC AG, SERUM: 0.3 ng/mL (ref 0.0–4.0)

## 2018-12-10 ENCOUNTER — Encounter: Payer: Self-pay | Admitting: Family Medicine

## 2018-12-10 ENCOUNTER — Ambulatory Visit (INDEPENDENT_AMBULATORY_CARE_PROVIDER_SITE_OTHER): Payer: Medicare Other | Admitting: Family Medicine

## 2018-12-10 VITALS — BP 120/84 | HR 94 | Temp 98.4°F | Resp 16 | Ht 72.5 in | Wt 255.2 lb

## 2018-12-10 DIAGNOSIS — N476 Balanoposthitis: Secondary | ICD-10-CM | POA: Diagnosis not present

## 2018-12-10 DIAGNOSIS — E1165 Type 2 diabetes mellitus with hyperglycemia: Secondary | ICD-10-CM | POA: Diagnosis not present

## 2018-12-10 DIAGNOSIS — R739 Hyperglycemia, unspecified: Secondary | ICD-10-CM | POA: Diagnosis not present

## 2018-12-10 LAB — GLUCOSE, POCT (MANUAL RESULT ENTRY): POC GLUCOSE: 332 mg/dL — AB (ref 70–99)

## 2018-12-10 NOTE — Patient Instructions (Addendum)
Start your lantus at dinner time at 8pm Check your sugar before you go to sleep  Check your sugars when you wake up Check your sugars one hour after breakfast  Increase you lantus to 12 units at dinner time tonight Then increase by 2 units every 3-4 days until the fasting glucose in the morning is decreasing The goal of the fasting glucose should be 120-160 for now  If you get dizziness and fatigue and your sugars are still in the 200s it is because your body is not used to a normal sugar. Eat a protein rich snack like a mozzarella cheese stick  Fatty foods also raise your sugar. Avoid charp cheddar cheese or any orange color cheese Use mozzarella or swiss cheese.  Return to clinic with your sugar readings in 2 weeks.   If you have lab work done today you will be contacted with your lab results within the next 2 weeks.  If you have not heard from [place] then please contact us. The fastest way to get your results is to register for My Chart.   IF you received an x-ray today, you will receive an invoice from [facility]. Please contact [facility] at [phone] with questions or concerns regarding your invoice.   IF you received labwork today, you will receive an invoice from [place]. Please contact [facility] at [phone] with questions or concerns regarding your invoice.   Our billing staff will not be able to assist you with questions regarding bills from these companies.  You will be contacted with the lab results as soon as they are available. The fastest way to get your results is to activate your My Chart account. Instructions are located on the last page of this paperwork. If you have not heard from [place] regarding the results in 2 weeks, please contact this office.

## 2018-12-10 NOTE — Progress Notes (Signed)
Established Patient Office Visit  Subjective:  Patient ID: Edward Briggs, male    DOB: 10-12-1951  Age: 68 y.o. MRN: [id number]  CC:  Chief Complaint  Patient presents with  . Swelling    Penile at foreskin, per pt " swelling is a little better" still taking medication  . Diabetes    uncontrolled, f/u x 3 days pt stated that he is using insulin    HPI Edward Briggs presents for   Patient reports that he was noncompliant with his diabetes medications prior to seeing Dr. [name] 3 days ago. Dr. [name] restarted metformin 519m bid with a meal and lantus 10 units daily He has not had diabetic education as yet and has questions about what he can eat.  For dinner he had baked potato He put cheese on his potato  He is taking the lantus and went and got his supplies and demonstration yesterday. He took his lantus 10 units at 3pm He reports that he did not check sugars before or after the lantus He denies sweats, shakes, nausea or vomiting. He urinates frequently.   Diabetes management ACE/ARB? Lisinopril Insulin? yes Metformin? yes Statin? No, last LDL 77 Aspirin? Yes, asa 865mEye exam? Up to date Dental Exam? Not up to date Podiatry? Upcoming appointment Depression screen-  phq 0 Immunizations-  Up to date  Penile irritation He reports he has been using his hydrocortisone lotion And taking the diflucan He has a long foreskin but it does not affect him typically He had penile irritation     Past Medical History:  Diagnosis Date  . Arthritis   . [place] without complication [place] [place]  . GERD (gastroesophageal reflux disease)    no meds    Past Surgical History:  Procedure Laterality Date  . ABSCESS DRAINAGE    . FRACTURE SURGERY    . HARDWARE REMOVAL  03/26/2012   Procedure: HARDWARE REMOVAL;  Surgeon: PaColin RheinMD;  [facility];  Laterality: Left;  hardware removal deep left ankle syndesmotic  screws only and stress xrays ankle   . ORIF ANKLE FRACTURE  12/03/2011   Procedure: OPEN REDUCTION INTERNAL FIXATION (ORIF) ANKLE FRACTURE;  Surgeon: PaColin RheinMD;  [facility];  Laterality: Left;  left medial malleolus fracture  . PILONIDAL CYST EXCISION  ~1982  . SKIN GRAFT  ~1970   Lt great toe (graft from Lt thigh)    Family History  Problem Relation Age of Onset  . Cancer Mother   . Cancer Father     Social History   Socioeconomic History  . Marital status: Married    Spouse name: Not on file  . Number of children: Not on file  . Years of education: Not on file  . Highest education level: Not on file  Occupational History  . Not on file  Social Needs  . Financial resource strain: Not on file  . Food insecurity:    Worry: Not on file    Inability: Not on file  . Transportation needs:    Medical: Not on file    Non-medical: Not on file  Tobacco Use  . Smoking status: Former Smoker    Last attempt to quit: 10/30/1988    Years since quitting: 30.1  . Smokeless tobacco: Never Used  Substance and Sexual Activity  . Alcohol use: Yes    Alcohol/week: 1.0 standard drinks    Types: 1  Shots of liquor per week    Comment: every two weeks  . Drug use: No  . Sexual activity: Yes    Birth control/protection: None  Lifestyle  . Physical activity:    Days per week: Not on file    Minutes per session: Not on file  . Stress: Not on file  Relationships  . Social connections:    Talks on phone: Not on file    Gets together: Not on file    Attends religious service: Not on file    Active member of club or organization: Not on file    Attends meetings of clubs or organizations: Not on file    Relationship status: Not on file  . Intimate partner violence:    Fear of current or ex partner: Not on file    Emotionally abused: Not on file    Physically abused: Not on file    Forced sexual activity: Not on file  Other Topics  Concern  . Not on file  Social History Narrative  . Not on file    Outpatient Medications Prior to Visit  Medication Sig Dispense Refill  . Ascorbic Acid (VITAMIN C PO) Take by mouth.    [name] aspirin 81 MG tablet Take 81 mg by mouth daily.    . Blood Glucose Monitoring Suppl (BLOOD GLUCOSE METER) kit Use as instructed 1 each 0  . hydrocortisone 1 % lotion Apply 1 application topically 2 (two) times daily. 118 mL 0  . Insulin Glargine (LANTUS SOLOSTAR) 100 UNIT/ML Solostar Pen Inject 10 Units into the skin daily. 5 pen 1  . Insulin Pen Needle (PEN NEEDLES) 32G X 4 MM MISC 1 application by Does not apply route daily. 100 each 2  . lisinopril (PRINIVIL,ZESTRIL) 5 MG tablet TAKE 1 TABLET BY MOUTH EVERY DAY 30 tablet 0  . metFORMIN (GLUCOPHAGE) 500 MG tablet TAKE 1 TABLET BY MOUTH TWICE A DAY WITH A MEAL  Office visit needed 30 tablet 0   No facility-administered medications prior to visit.     No Known Allergies  ROS Review of Systems Review of Systems  Constitutional: Negative for activity change, appetite change, chills and fever.  HENT: Negative for congestion, nosebleeds, trouble swallowing and voice change.   Respiratory: Negative for cough, shortness of breath and wheezing.   Gastrointestinal: Negative for diarrhea, nausea and vomiting.  Genitourinary: Negative for difficulty urinating, dysuria, flank pain and hematuria.  Musculoskeletal: Negative for back pain, joint swelling and neck pain.  Neurological: Negative for dizziness, speech difficulty, light-headedness and numbness.  See HPI. All other review of systems negative.      Objective:    Physical Exam  BP 120/84 (BP Location: Right Arm, Patient Position: Sitting, Cuff Size: Large)   Pulse 94   Temp 98.4 F (36.9 C) (Oral)   Resp 16   Ht 6' 0.5" (1.842 m)   Wt 255 lb 3.2 oz (115.8 kg)   SpO2 99%   BMI 34.14 kg/m  Wt Readings from Last 3 Encounters:  12/10/18 255 lb 3.2 oz (115.8 kg)  12/07/18 255 lb (115.7 kg)   07/13/18 257 lb 12.8 oz (116.9 kg)   Physical Exam  Constitutional: Oriented to person, place, and time. Appears well-developed and well-nourished.  HENT:  Head: Normocephalic and atraumatic.  Eyes: Conjunctivae and EOM are normal.  Cardiovascular: Normal rate, regular rhythm, normal heart sounds and intact distal pulses.  No murmur heard. Pulmonary/Chest: Effort normal and breath sounds normal. No stridor. No respiratory distress.  Has no wheezes.  Neurological: Is alert and oriented to person, place, and time.  Skin: Skin is warm. Capillary refill takes less than 2 seconds.  Psychiatric: Has a normal mood and affect. Behavior is normal. Judgment and thought content normal.  GU chaperone present Foreskin with healing fissures Mild erythema Glans with thin white discharge   Health Maintenance Due  Topic Date Due  . COLONOSCOPY  10/23/2001    There are no preventive care reminders to display for this patient.  No results found for: TSH Lab Results  Component Value Date   WBC 8.5 08/20/2015   HGB 15.1 08/20/2015   HCT 42.2 08/20/2015   MCV 91.5 08/20/2015   PLT 267 08/20/2015   Lab Results  Component Value Date   NA 136 12/07/2018   K 4.4 12/07/2018   CO2 20 12/07/2018   GLUCOSE 365 (H) 12/07/2018   BUN 10 12/07/2018   CREATININE 0.98 12/07/2018   BILITOT 0.5 12/07/2018   ALKPHOS 88 12/07/2018   AST 13 12/07/2018   ALT 14 12/07/2018   PROT 7.1 12/07/2018   ALBUMIN 4.1 12/07/2018   CALCIUM 9.2 12/07/2018   Lab Results  Component Value Date   CHOL 146 12/07/2018   Lab Results  Component Value Date   HDL 49 12/07/2018   Lab Results  Component Value Date   LDLCALC 77 12/07/2018   Lab Results  Component Value Date   TRIG 98 12/07/2018   Lab Results  Component Value Date   CHOLHDL 3.0 12/07/2018   Lab Results  Component Value Date   HGBA1C 13.2 (H) 12/07/2018      Assessment & Plan:   Problem List Items Addressed This Visit    None    Visit  Diagnoses    Elevated blood sugar    -  Primary   Type 2 diabetes mellitus with hyperglycemia, without long-term current use of insulin ([place])    -  Will continue diabetic education at each visit.  Discussed some dietary changes. Pt will need close follow up for continued support on glucose monitoring and titrating his insulin. Will use same day appointment to facilitate that and plan for him to continue with Dr. [name] after the next visit  At next visit will perform more teaching on lantus titration to achieve goal of blood glucose of 120 fasting  Will also discuss ways to improve his diet and review glycemic index    Relevant Orders   POCT glucose (manual entry) (Completed)   Balanoposthitis    -  Continue to retract foreskin and keep it dry Use cream as discussed       No orders of the defined types were placed in this encounter.   Follow-up: Return in about 2 weeks (around 12/24/2018) for 2 weeks with same day with Dr. Nolon Rod and 6 weeks with Dr. [name] for office visit to recheck lab.    Forrest Moron, MD

## 2018-12-16 NOTE — Progress Notes (Signed)
Diabetes Self-Management Education  Visit Type: First/Initial  Appt. Start Time: 1430 Appt. End Time: 5038  12/16/2018  Edward Briggs, identified by name and date of birth, is a 68 y.o. male with a diagnosis of Diabetes: Type 2. Patient here for basic diabetes education and insulin instruction. He also has a new meter and would like me to show him how to use it.   ASSESSMENT  There were no vitals taken for this visit. There is no height or weight on file to calculate BMI.  Diabetes Self-Management Education - 12/16/18 0727      Visit Information   Visit Type  First/Initial      Initial Visit   Diabetes Type  Type 2    Are you currently following a meal plan?  No    Are you taking your medications as prescribed?  Yes    Date Diagnosed  2019      Health Coping   How would you rate your overall health?  Fair      Psychosocial Assessment   Other persons present  Patient    Patient Concerns  Medication;Monitoring    Special Needs  None    Learning Readiness  Change in progress    How often do you need to have someone help you when you read instructions, pamphlets, or other written materials from your doctor or pharmacy?  1 - Never    What is the last grade level you completed in school?  college      Pre-Education Assessment   Patient understands using medications safely.  Needs Instruction    Patient understands monitoring blood glucose, interpreting and using results  Needs Review      Complications   How often do you check your blood sugar?  1-2 times/day    Have you had a dilated eye exam in the past 12 months?  No    Have you had a dental exam in the past 12 months?  No    Are you checking your feet?  Yes    How many days per week are you checking your feet?  2      Exercise   Exercise Type  Light (walking / raking leaves)    How many days per week to you exercise?  3    How many minutes per day do you exercise?  30    Total minutes per week of exercise  90       Patient Education   Medications  Taught/reviewed insulin injection, site rotation, insulin storage and needle disposal.    Monitoring  Taught/evaluated SMBG meter.;Taught/discussed recording of test results and interpretation of SMBG.;Identified appropriate SMBG and/or A1C goals.      Individualized Goals (developed by patient)   Medications  take my medication as prescribed      Post-Education Assessment   Patient understands using medications safely.  Demonstrates understanding / competency    Patient understands monitoring blood glucose, interpreting and using results  Demonstrates understanding / competency      Outcomes   Expected Outcomes  Demonstrated interest in learning. Expect positive outcomes    Future DMSE  PRN    Program Status  Other (comment)       Individualized Plan for Diabetes Self-Management Training:   Learning Objective:  Patient will have a greater understanding of diabetes self-management. Patient education plan is to attend individual and/or group sessions per assessed needs and concerns.   Insulin Instruction  The following learning objectives were  met by the patient during this visit:   Insulin Action of Lantus insulins  Reviewed pen including # units per pen cartridge and needles  Hygiene and storage  Dialing up dose on pen   Rotation of Sites  Hypoglycemia- symptoms, causes , treatment choices  Record keeping and MD follow up   Patient demonstrated understanding of insulin administration by return demonstration.  Patient received the following handouts:  Insulin Pen Instruction Handout                                        Patient to start on insulin as Rx'd by MD @ 10 units at night  Plan:   Patient Instructions  Plan:  Take Lantus @ 10 units daily every night around 9 PM Use your new meter to check BG as directed by MD Consider checking BG at alternate times, both before and after meals, occasionally after exercise Let your MD know  if you have episodes of hypoglycemia  Expected Outcomes:  Demonstrated interest in learning. Expect positive outcomes  If problems or questions, patient to contact team via:  Phone  Future DSME appointment: PRN

## 2018-12-16 NOTE — Patient Instructions (Signed)
Plan:  Take Lantus @ 10 units daily every night around 9 PM Use your new meter to check BG as directed by MD Consider checking BG at alternate times, both before and after meals, occasionally after exercise Let your MD know if you have episodes of hypoglycemia

## 2018-12-21 ENCOUNTER — Encounter: Payer: Self-pay | Admitting: Radiology

## 2018-12-28 ENCOUNTER — Other Ambulatory Visit: Payer: Self-pay

## 2018-12-28 ENCOUNTER — Encounter: Payer: Self-pay | Admitting: Family Medicine

## 2018-12-28 ENCOUNTER — Telehealth: Payer: Self-pay | Admitting: Family Medicine

## 2018-12-28 ENCOUNTER — Other Ambulatory Visit: Payer: Self-pay | Admitting: Family Medicine

## 2018-12-28 ENCOUNTER — Ambulatory Visit (INDEPENDENT_AMBULATORY_CARE_PROVIDER_SITE_OTHER): Payer: Medicare Other | Admitting: Family Medicine

## 2018-12-28 VITALS — BP 124/75 | HR 86 | Temp 98.6°F | Resp 20 | Ht 72.32 in | Wt 257.8 lb

## 2018-12-28 DIAGNOSIS — Z1211 Encounter for screening for malignant neoplasm of colon: Secondary | ICD-10-CM

## 2018-12-28 DIAGNOSIS — E1165 Type 2 diabetes mellitus with hyperglycemia: Secondary | ICD-10-CM

## 2018-12-28 DIAGNOSIS — R739 Hyperglycemia, unspecified: Secondary | ICD-10-CM | POA: Diagnosis not present

## 2018-12-28 DIAGNOSIS — I10 Essential (primary) hypertension: Secondary | ICD-10-CM

## 2018-12-28 LAB — GLUCOSE, POCT (MANUAL RESULT ENTRY): POC Glucose: 241 mg/dl — AB (ref 70–99)

## 2018-12-28 NOTE — Patient Instructions (Addendum)
Increase lantus to 16 units at bedtime and stay there until you see Dr. [name]   If you have lab work done today you will be contacted with your lab results within the next 2 weeks.  If you have not heard from [place] then please contact us. The fastest way to get your results is to register for My Chart.   IF you received an x-ray today, you will receive an invoice from [facility]. Please contact [facility] at [phone] with questions or concerns regarding your invoice.   IF you received labwork today, you will receive an invoice from [facility]. Please contact [facility] at [phone] with questions or concerns regarding your invoice.   Our billing staff will not be able to assist you with questions regarding bills from these companies.  You will be contacted with the lab results as soon as they are available. The fastest way to get your results is to activate your My Chart account. Instructions are located on the last page of this paperwork. If you have not heard from [place] regarding the results in 2 weeks, please contact this office.     Colonoscopy, Adult A colonoscopy is an exam to look at the entire large intestine. During the exam, a lubricated, flexible tube that has a camera on the end of it is inserted into the anus and then passed into the rectum, colon, and other parts of the large intestine. You may have a colonoscopy as a part of normal colorectal screening or if you have certain symptoms, such as:  Lack of red blood cells (anemia).  Diarrhea that does not go away.  Abdominal pain.  Blood in your stool (feces). A colonoscopy can help screen for and diagnose medical problems, including:  Tumors.  Polyps.  Inflammation.  Areas of bleeding. Tell a health care provider about:  Any allergies you have.  All medicines you are taking, including vitamins, herbs, eye drops, creams, and over-the-counter medicines.  Any problems you or family members have had  with anesthetic medicines.  Any blood disorders you have.  Any surgeries you have had.  Any medical conditions you have.  Any problems you have had passing stool. What are the risks? Generally, this is a safe procedure. However, problems may occur, including:  Bleeding.  A tear in the intestine.  A reaction to medicines given during the exam.  Infection (rare). What happens before the procedure? Eating and drinking restrictions Follow instructions from your health care provider about eating and drinking, which may include:  A few days before the procedure - follow a low-fiber diet. Avoid nuts, seeds, dried fruit, raw fruits, and vegetables.  1-3 days before the procedure - follow a clear liquid diet. Drink only clear liquids, such as clear broth or bouillon, black coffee or tea, clear juice, clear soft drinks or sports drinks, gelatin dessert, and popsicles. Avoid any liquids that contain red or purple dye.  On the day of the procedure - do not eat or drink anything starting 2 hours before the procedure, or within the time period that your health care provider recommends. Up to 2 hours before the procedure, you may continue to drink clear liquids, such as water or clear fruit juice. Bowel prep If you were prescribed an oral bowel prep to clean out your colon:  Take it as told by your health care provider. Starting the day before your procedure, you will need to drink a large amount of medicated liquid. The liquid will cause you to  have multiple loose stools until your stool is almost clear or light green.  If your skin or anus gets irritated from diarrhea, you may use these to relieve the irritation: ? Medicated wipes, such as adult wet wipes with aloe and vitamin E. ? A skin-soothing product like petroleum jelly.  If you vomit while drinking the bowel prep, take a break for up to 60 minutes and then begin the bowel prep again. If vomiting continues and you cannot take the bowel  prep without vomiting, call your health care provider.  To clean out your colon, you may also be given: ? Laxative medicines. ? Instructions about how to use an enema. General instructions  Ask your health care provider about: ? Changing or stopping your regular medicines or supplements. This is especially important if you are taking iron supplements, diabetes medicines, or blood thinners. ? Taking medicines such as aspirin and ibuprofen. These medicines can thin your blood. Do not take these medicines before the procedure if your health care provider tells you not to.  Plan to have someone take you home from the hospital or clinic. What happens during the procedure?   An IV may be inserted into one of your veins.  You will be given medicine to help you relax (sedative).  To reduce your risk of infection: ? Your health care team will wash or sanitize their hands. ? Your anal area will be washed with soap.  You will be asked to lie on your side with your knees bent.  Your health care provider will lubricate a long, thin, flexible tube. The tube will have a camera and a light on the end.  The tube will be inserted into your anus.  The tube will be gently eased through your rectum and colon.  Air will be delivered into your colon to keep it open. You may feel some pressure or cramping.  The camera will be used to take images during the procedure.  A small tissue sample may be removed to be examined under a microscope (biopsy).  If small polyps are found, your health care provider may remove them and have them checked for cancer cells.  When the exam is done, the tube will be removed. The procedure may vary among health care providers and hospitals. What happens after the procedure?  Your blood pressure, heart rate, breathing rate, and blood oxygen level will be monitored until the medicines you were given have worn off.  Do not drive for 24 hours after the exam.  You may  have a small amount of blood in your stool.  You may pass gas and have mild abdominal cramping or bloating due to the air that was used to inflate your colon during the exam.  It is up to you to get the results of your procedure. Ask your health care provider, or the department performing the procedure, when your results will be ready. Summary  A colonoscopy is an exam to look at the entire large intestine.  During a colonoscopy, a lubricated, flexible tube with a camera on the end of it is inserted into the anus and then passed into the colon and other parts of the large intestine.  Follow instructions from your health care provider about eating and drinking before the procedure.  If you were prescribed an oral bowel prep to clean out your colon, take it as told by your health care provider.  After your procedure, your blood pressure, heart rate, breathing rate, and blood  oxygen level will be monitored until the medicines you were given have worn off. This information is not intended to replace advice given to you by your health care provider. Make sure you discuss any questions you have with your health care provider. Document Released: 10/17/2000 Document Revised: 08/12/2017 Document Reviewed: 01/01/2016 Elsevier Interactive Patient Education  2019 [employer].

## 2018-12-28 NOTE — Telephone Encounter (Signed)
Patient needs a new prescription sent for Accucheck Aviva test strips. He checks 3x day, has a few left.

## 2018-12-28 NOTE — Telephone Encounter (Signed)
Copied from [place] [id number]. Topic: Quick Communication - Rx Refill/Question >> Dec 28, 2018  3:36 PM Margot Ables wrote: Medication: test strips for AccuChek Aviva - pt states he is just starting out - pt has a few strips left - testing 3/day  Has the patient contacted their pharmacy? No -  New RX needed Preferred Pharmacy (with phone number or street name): [place], [place] - [address] [phone] (Phone) [phone] (Fax)

## 2018-12-28 NOTE — Telephone Encounter (Signed)
Requested medication (s) are due for refill today -yes  Requested medication (s) are on the active medication list -yes  Future visit scheduled -yes  Last refill: metformin- 1 year                 Lisinopril- 7 months  Notes to clinic: Patient was seen in office today and has follow up appointment scheduled next month- notes for today not complete. Sent for review of refill request.  Requested Prescriptions  Pending Prescriptions Disp Refills   metFORMIN (GLUCOPHAGE) 500 MG tablet 30 tablet 0    Sig: TAKE 1 TABLET BY MOUTH TWICE A DAY WITH A MEAL  Office visit needed     Endocrinology:  Diabetes - Biguanides Failed - 12/28/2018  3:23 PM      Failed - HBA1C is between 0 and 7.9 and within 180 days    Hgb A1c MFr Bld  Date Value Ref Range Status  12/07/2018 13.2 (H) 4.8 - 5.6 % Final    Comment:             Prediabetes: 5.7 - 6.4          Diabetes: >6.4          Glycemic control for adults with diabetes: <7.0          Passed - Cr in normal range and within 360 days    Creat  Date Value Ref Range Status  08/20/2015 0.86 0.70 - 1.25 mg/dL Final   Creatinine, Ser  Date Value Ref Range Status  12/07/2018 0.98 0.76 - 1.27 mg/dL Final         Passed - eGFR in normal range and within 360 days    GFR, Est African American  Date Value Ref Range Status  08/20/2015 >89 >=60 mL/min Final   GFR calc Af Amer  Date Value Ref Range Status  12/07/2018 92 >59 mL/min/1.73 Final   GFR, Est Non African American  Date Value Ref Range Status  08/20/2015 >89 >=60 mL/min Final    Comment:      The estimated GFR is a calculation valid for adults (>=69 years old) that uses the CKD-EPI algorithm to adjust for age and sex. It is   not to be used for children, pregnant women, hospitalized patients,    patients on dialysis, or with rapidly changing kidney function. According to the NKDEP, eGFR >89 is normal, 60-89 shows mild impairment, 30-59 shows moderate impairment, 15-29 shows  severe impairment and <15 is ESRD.      GFR calc non Af Amer  Date Value Ref Range Status  12/07/2018 79 >59 mL/min/1.73 Final         Passed - Valid encounter within last 6 months    Recent Outpatient Visits          Today Uncontrolled type 2 diabetes mellitus with hyperglycemia ([place])   Primary Care at [place], MD   2 weeks ago Type 2 diabetes mellitus with hyperglycemia, without long-term current use of insulin [facility])   Primary Care at [facility], Arlie Solomons, MD   3 weeks ago Annual physical exam   [facility] at [facility], [name], MD   5 months ago Cough   Primary Care at [facility] [facility], [name], MD   1 year ago Type 2 diabetes mellitus without complication, without long-term current use of insulin [facility])   Primary Care at [place] [facility], [name], MD      Future Appointments  In 3 weeks [name], MD Primary Care at [place], [facility]          lisinopril (PRINIVIL,ZESTRIL) 5 MG tablet 30 tablet 0    Sig: Take 1 tablet (5 mg total) by mouth daily.     Cardiovascular:  ACE Inhibitors Passed - 12/28/2018  3:23 PM      Passed - Cr in normal range and within 180 days    Creat  Date Value Ref Range Status  08/20/2015 0.86 0.70 - 1.25 mg/dL Final   Creatinine, Ser  Date Value Ref Range Status  12/07/2018 0.98 0.76 - 1.27 mg/dL Final         Passed - K in normal range and within 180 days    Potassium  Date Value Ref Range Status  12/07/2018 4.4 3.5 - 5.2 mmol/L Final         Passed - Patient is not pregnant      Passed - Last BP in normal range    BP Readings from Last 1 Encounters:  12/28/18 124/75         Passed - Valid encounter within last 6 months    Recent Outpatient Visits          Today Uncontrolled type 2 diabetes mellitus with hyperglycemia ([place])   Primary Care at [facility], Zoe A, MD   2 weeks ago Type 2 diabetes mellitus with hyperglycemia, without long-term current use of insulin ([place])   Primary Care  at [facility], Arlie Solomons, MD   3 weeks ago Annual physical exam   [facility] at [facility], [name], MD   5 months ago Cough   Primary Care at [facility], [name], MD   1 year ago Type 2 diabetes mellitus without complication, without long-term current use of insulin [facility])   Primary Care at [facility] [facility], [name], MD      Future Appointments            In 3 weeks [name], MD Primary Care at [place], [facility]            Requested Prescriptions  Pending Prescriptions Disp Refills   metFORMIN (GLUCOPHAGE) 500 MG tablet 30 tablet 0    Sig: TAKE 1 TABLET BY MOUTH TWICE A DAY WITH A MEAL  Office visit needed     Endocrinology:  Diabetes - Biguanides Failed - 12/28/2018  3:23 PM      Failed - HBA1C is between 0 and 7.9 and within 180 days    Hgb A1c MFr Bld  Date Value Ref Range Status  12/07/2018 13.2 (H) 4.8 - 5.6 % Final    Comment:             Prediabetes: 5.7 - 6.4          Diabetes: >6.4          Glycemic control for adults with diabetes: <7.0          Passed - Cr in normal range and within 360 days    Creat  Date Value Ref Range Status  08/20/2015 0.86 0.70 - 1.25 mg/dL Final   Creatinine, Ser  Date Value Ref Range Status  12/07/2018 0.98 0.76 - 1.27 mg/dL Final         Passed - eGFR in normal range and within 360 days    GFR, Est African American  Date Value Ref Range Status  08/20/2015 >89 >=60 mL/min Final   GFR calc Af Wyvonnia Lora  Date  Value Ref Range Status  12/07/2018 92 >59 mL/min/1.73 Final   GFR, Est Non African American  Date Value Ref Range Status  08/20/2015 >89 >=60 mL/min Final    Comment:      The estimated GFR is a calculation valid for adults (>=4 years old) that uses the CKD-EPI algorithm to adjust for age and sex. It is   not to be used for children, pregnant women, hospitalized patients,    patients on dialysis, or with rapidly changing kidney function. According to the NKDEP, eGFR >89 is normal, 60-89 shows  mild impairment, 30-59 shows moderate impairment, 15-29 shows severe impairment and <15 is ESRD.      GFR calc non Af Amer  Date Value Ref Range Status  12/07/2018 79 >59 mL/min/1.73 Final         Passed - Valid encounter within last 6 months    Recent Outpatient Visits          Today Uncontrolled type 2 diabetes mellitus with hyperglycemia ([place])   Primary Care at [place], MD   2 weeks ago Type 2 diabetes mellitus with hyperglycemia, without long-term current use of insulin [name])   Primary Care at [facility], Arlie Solomons, MD   3 weeks ago Annual physical exam   Primary Care at [facility], [name], MD   5 months ago Cough   Primary Care at [facility] [facility], [name], MD   1 year ago Type 2 diabetes mellitus without complication, without long-term current use of insulin [facility])   Primary Care at [place], [name], MD      Future Appointments            In 3 weeks [name], MD Primary Care at [place], [facility]          lisinopril (PRINIVIL,ZESTRIL) 5 MG tablet 30 tablet 0    Sig: Take 1 tablet (5 mg total) by mouth daily.     Cardiovascular:  ACE Inhibitors Passed - 12/28/2018  3:23 PM      Passed - Cr in normal range and within 180 days    Creat  Date Value Ref Range Status  08/20/2015 0.86 0.70 - 1.25 mg/dL Final   Creatinine, Ser  Date Value Ref Range Status  12/07/2018 0.98 0.76 - 1.27 mg/dL Final         Passed - K in normal range and within 180 days    Potassium  Date Value Ref Range Status  12/07/2018 4.4 3.5 - 5.2 mmol/L Final         Passed - Patient is not pregnant      Passed - Last BP in normal range    BP Readings from Last 1 Encounters:  12/28/18 124/75         Passed - Valid encounter within last 6 months    Recent Outpatient Visits          Today Uncontrolled type 2 diabetes mellitus with hyperglycemia ([place])   Primary Care at [facility], Zoe A, MD   2 weeks ago Type 2 diabetes mellitus with hyperglycemia,  without long-term current use of insulin ([place])   Primary Care at [facility], Arlie Solomons, MD   3 weeks ago Annual physical exam   [facility] Care at [facility], [name], MD   5 months ago Cough   Primary Care at [facility], [name], MD   1 year ago Type 2 diabetes mellitus without complication, without long-term current  use of insulin [facility])   Primary Care at [facility], [name], MD      Future Appointments            In 3 weeks [name] [name], MD Primary Care at [place], [facility]

## 2018-12-28 NOTE — Telephone Encounter (Signed)
Copied from [place] [id number]. Topic: Quick Communication - Rx Refill/Question >> Dec 28, 2018  3:18 PM Alanda Slim E wrote: Medication: metFORMIN (GLUCOPHAGE) 500 MG tablet  lisinopril (PRINIVIL,ZESTRIL) 5 MG tablet  Has the patient contacted their pharmacy? Yes- Pt changed to different pharmacy   Preferred Pharmacy (with phone number or street name): [place], [place] - [address] [phone] (Phone) [phone] (Fax)    Agent: Please be advised that RX refills may take up to 3 business days. We ask that you follow-up with your pharmacy.

## 2018-12-28 NOTE — Progress Notes (Signed)
Established Patient Office Visit  Subjective:  Patient ID: HAKEEN SHIPES, male    DOB: [birth year]  Age: 68 y.o. MRN: [id number]  CC:  Chief Complaint  Patient presents with  . Diabetes    f/u    HPI Lin C Mindel presents for      Diabetes Mellitus: Patient presents for follow up of diabetes. Symptoms: penile irritation. Symptoms have gradually improved. Patient denies hypoglycemia , increase appetite, nausea, paresthesia of the feet, polydipsia and polyuria.  Evaluation to date has been included: hemoglobin A1C.  Home sugars: BGs improving.  He reports that he increased his lantus by 2 units at a time to get his fasting glucose down It is now 170-190s fasting in the morning and he is doing Lantus 14 units at bedtime He denies hypoglycemia  Last night he had dinner that was baked salmon and string beans   Wt Readings from Last 3 Encounters:  12/28/18 257 lb 12.8 oz (116.9 kg)  12/10/18 255 lb 3.2 oz (115.8 kg)  12/07/18 255 lb (115.7 kg)    Past Medical History:  Diagnosis Date  . Arthritis   . Diabetes mellitus without complication ([place])   . GERD (gastroesophageal reflux disease)    no meds    Past Surgical History:  Procedure Laterality Date  . ABSCESS DRAINAGE    . FRACTURE SURGERY    . HARDWARE REMOVAL  03/26/2012   Procedure: HARDWARE REMOVAL;  Surgeon: [name], MD;  Location: [place];  Service: Orthopedics;  Laterality: Left;  hardware removal deep left ankle syndesmotic screws only and stress xrays ankle   . ORIF ANKLE FRACTURE  12/03/2011   Procedure: OPEN REDUCTION INTERNAL FIXATION (ORIF) ANKLE FRACTURE;  Surgeon: [name], MD;  Location: [place];  Service: Orthopedics;  Laterality: Left;  left medial malleolus fracture  . PILONIDAL CYST EXCISION  ~1982  . SKIN GRAFT  ~1970   Lt great toe (graft from Lt thigh)    Family History  Problem Relation Age of Onset  . Cancer Mother   . Cancer Father      Social History   Socioeconomic History  . Marital status: Married    Spouse name: Not on file  . Number of children: 2  . Years of education: Not on file  . Highest education level: Not on file  Occupational History  . Not on file  Social Needs  . Financial resource strain: Not on file  . Food insecurity:    Worry: Not on file    Inability: Not on file  . Transportation needs:    Medical: Not on file    Non-medical: Not on file  Tobacco Use  . Smoking status: Former Smoker    Last attempt to quit: 10/30/1988    Years since quitting: 30.1  . Smokeless tobacco: Never Used  Substance and Sexual Activity  . Alcohol use: Yes    Alcohol/week: 1.0 standard drinks    Types: 1 Shots of liquor per week    Comment: every two weeks  . Drug use: No  . Sexual activity: Yes    Birth control/protection: None  Lifestyle  . Physical activity:    Days per week: Not on file    Minutes per session: Not on file  . Stress: Not on file  Relationships  . Social connections:    Talks on phone: Not on file    Gets together: Not on file    Attends religious  service: Not on file    Active member of club or organization: Not on file    Attends meetings of clubs or organizations: Not on file    Relationship status: Not on file  . Intimate partner violence:    Fear of current or ex partner: Not on file    Emotionally abused: Not on file    Physically abused: Not on file    Forced sexual activity: Not on file  Other Topics Concern  . Not on file  Social History Narrative  . Not on file    Outpatient Medications Prior to Visit  Medication Sig Dispense Refill  . Ascorbic Acid (VITAMIN C PO) Take by mouth.    [name] aspirin 81 MG tablet Take 81 mg by mouth daily.    . Blood Glucose Monitoring Suppl (BLOOD GLUCOSE METER) kit Use as instructed 1 each 0  . hydrocortisone 1 % lotion Apply 1 application topically 2 (two) times daily. 118 mL 0  . Insulin Glargine (LANTUS SOLOSTAR) 100 UNIT/ML  Solostar Pen Inject 10 Units into the skin daily. 5 pen 1  . Insulin Pen Needle (PEN NEEDLES) 32G X 4 MM MISC 1 application by Does not apply route daily. 100 each 2  . lisinopril (PRINIVIL,ZESTRIL) 5 MG tablet TAKE 1 TABLET BY MOUTH EVERY DAY 30 tablet 0  . metFORMIN (GLUCOPHAGE) 500 MG tablet TAKE 1 TABLET BY MOUTH TWICE A DAY WITH A MEAL  Office visit needed 30 tablet 0   No facility-administered medications prior to visit.     No Known Allergies  ROS Review of Systems Review of Systems  Constitutional: Negative for activity change, appetite change, chills and fever.  HENT: Negative for congestion, nosebleeds, trouble swallowing and voice change.   Respiratory: Negative for cough, shortness of breath and wheezing.   Gastrointestinal: Negative for diarrhea, nausea and vomiting.  Genitourinary: Negative for difficulty urinating, dysuria, flank pain and hematuria.  Musculoskeletal: Negative for back pain, joint swelling and neck pain.  Neurological: Negative for dizziness, speech difficulty, light-headedness and numbness.  See HPI. All other review of systems negative.     Objective:    Physical Exam  BP 124/75   Pulse 86   Temp 98.6 F (37 C) (Oral)   Resp 20   Ht 6' 0.32" (1.837 m)   Wt 257 lb 12.8 oz (116.9 kg)   SpO2 97%   BMI 34.65 kg/m  Wt Readings from Last 3 Encounters:  12/28/18 257 lb 12.8 oz (116.9 kg)  12/10/18 255 lb 3.2 oz (115.8 kg)  12/07/18 255 lb (115.7 kg)   Physical Exam  Constitutional: Oriented to person, place, and time. Appears well-developed and well-nourished.  HENT:  Head: Normocephalic and atraumatic.  Eyes: Conjunctivae and EOM are normal.  Cardiovascular: Normal rate, regular rhythm, normal heart sounds and intact distal pulses.  No murmur heard. Pulmonary/Chest: Effort normal and breath sounds normal. No stridor. No respiratory distress. Has no wheezes.  Neurological: Is alert and oriented to person, place, and time.  Skin: Skin is  warm. Capillary refill takes less than 2 seconds.  Psychiatric: Has a normal mood and affect. Behavior is normal. Judgment and thought content normal.    Health Maintenance Due  Topic Date Due  . COLONOSCOPY  10/23/2001  . OPHTHALMOLOGY EXAM  12/01/2014    There are no preventive care reminders to display for this patient.  No results found for: TSH Lab Results  Component Value Date   WBC 8.5 08/20/2015   HGB  15.1 08/20/2015   HCT 42.2 08/20/2015   MCV 91.5 08/20/2015   PLT 267 08/20/2015   Lab Results  Component Value Date   NA 136 12/07/2018   K 4.4 12/07/2018   CO2 20 12/07/2018   GLUCOSE 365 (H) 12/07/2018   BUN 10 12/07/2018   CREATININE 0.98 12/07/2018   BILITOT 0.5 12/07/2018   ALKPHOS 88 12/07/2018   AST 13 12/07/2018   ALT 14 12/07/2018   PROT 7.1 12/07/2018   ALBUMIN 4.1 12/07/2018   CALCIUM 9.2 12/07/2018   Lab Results  Component Value Date   CHOL 146 12/07/2018   Lab Results  Component Value Date   HDL 49 12/07/2018   Lab Results  Component Value Date   LDLCALC 77 12/07/2018   Lab Results  Component Value Date   TRIG 98 12/07/2018   Lab Results  Component Value Date   CHOLHDL 3.0 12/07/2018   Lab Results  Component Value Date   HGBA1C 13.2 (H) 12/07/2018      Assessment & Plan:   Problem List Items Addressed This Visit    None    Visit Diagnoses    Uncontrolled type 2 diabetes mellitus with hyperglycemia ([place])    -  Primary  Discussed that the goals of diabetes Continue insulin and glucose monitoring discussed some dietary modification Follow up with Dr. [name]    Relevant Orders   POCT glucose (manual entry) (Completed)   Fructosamine   CMP14+EGFR   Screening for colon cancer       Relevant Orders   Ambulatory referral to Gastroenterology   Elevated blood sugar          No orders of the defined types were placed in this encounter.   Follow-up: Return in about 4 weeks (around 01/25/2019) for next appointment with Dr.  [name] for diabetes.    Forrest Moron, MD

## 2018-12-29 ENCOUNTER — Other Ambulatory Visit: Payer: Self-pay | Admitting: Emergency Medicine

## 2018-12-29 DIAGNOSIS — E1165 Type 2 diabetes mellitus with hyperglycemia: Secondary | ICD-10-CM

## 2018-12-29 LAB — CMP14+EGFR
ALBUMIN: 4 g/dL (ref 3.8–4.8)
ALT: 18 IU/L (ref 0–44)
AST: 16 IU/L (ref 0–40)
Albumin/Globulin Ratio: 1.4 (ref 1.2–2.2)
Alkaline Phosphatase: 61 IU/L (ref 39–117)
BUN / CREAT RATIO: 13 (ref 10–24)
BUN: 11 mg/dL (ref 8–27)
Bilirubin Total: 0.5 mg/dL (ref 0.0–1.2)
CO2: 19 mmol/L — ABNORMAL LOW (ref 20–29)
Calcium: 9.3 mg/dL (ref 8.6–10.2)
Chloride: 101 mmol/L (ref 96–106)
Creatinine, Ser: 0.85 mg/dL (ref 0.76–1.27)
GFR calc Af Amer: 104 mL/min/{1.73_m2} (ref >59)
GFR calc non Af Amer: 90 mL/min/{1.73_m2} (ref >59)
Globulin, Total: 2.8 g/dL (ref 1.5–4.5)
Glucose: 226 mg/dL — ABNORMAL HIGH (ref 65–99)
Potassium: 4.8 mmol/L (ref 3.5–5.2)
Sodium: 137 mmol/L (ref 134–144)
Total Protein: 6.8 g/dL (ref 6.0–8.5)

## 2018-12-29 LAB — FRUCTOSAMINE: Fructosamine: 376 umol/L — ABNORMAL HIGH (ref 0–285)

## 2018-12-29 MED ORDER — GLUCOSE BLOOD VI STRP: ORAL_STRIP | 12 refills | Status: DC

## 2018-12-29 NOTE — Telephone Encounter (Signed)
accu check test strips sent into the pharm

## 2019-01-01 ENCOUNTER — Other Ambulatory Visit: Payer: Self-pay

## 2019-01-01 DIAGNOSIS — E1165 Type 2 diabetes mellitus with hyperglycemia: Secondary | ICD-10-CM

## 2019-01-01 MED ORDER — GLUCOSE BLOOD VI STRP: ORAL_STRIP | 12 refills | Status: DC

## 2019-01-03 ENCOUNTER — Other Ambulatory Visit: Payer: Self-pay | Admitting: Family Medicine

## 2019-01-03 DIAGNOSIS — I10 Essential (primary) hypertension: Secondary | ICD-10-CM

## 2019-01-03 DIAGNOSIS — E1165 Type 2 diabetes mellitus with hyperglycemia: Secondary | ICD-10-CM

## 2019-01-03 NOTE — Telephone Encounter (Signed)
Copied from [place] [id number]. Topic: Quick Communication - Rx Refill/Question >> Jan 03, 2019  4:38 PM Gustavus Messing wrote: Medication: metFORMIN (GLUCOPHAGE) 500 MG tablet  lisinopril (PRINIVIL,ZESTRIL) 5 MG tablet  glucose blood (ACCU-CHEK AVIVA) test strip [[id number]]  Has the patient contacted their pharmacy? No. (Agent: If no, request that the patient contact the pharmacy for the refill.) Out of refills and medications   Preferred Pharmacy (with phone number or street name): [place], [place] - [address] [phone] (Phone) [phone] (Fax)    Agent: Please be advised that RX refills may take up to 3 business days. We ask that you follow-up with your pharmacy.

## 2019-01-04 ENCOUNTER — Other Ambulatory Visit: Payer: Self-pay | Admitting: Emergency Medicine

## 2019-01-04 DIAGNOSIS — E1165 Type 2 diabetes mellitus with hyperglycemia: Secondary | ICD-10-CM

## 2019-01-04 MED ORDER — METFORMIN HCL 500 MG PO TABS: ORAL_TABLET | ORAL | 0 refills | Status: DC

## 2019-01-04 MED ORDER — GLUCOSE BLOOD VI STRP: ORAL_STRIP | 12 refills | Status: DC

## 2019-01-04 MED ORDER — LISINOPRIL 5 MG PO TABS: 5.0000 mg | ORAL_TABLET | Freq: Every day | ORAL | 0 refills | Status: DC

## 2019-01-04 NOTE — Telephone Encounter (Signed)
Courtesy refill already given- pt with upcoming appt given pills to last until OV Requested Prescriptions  Pending Prescriptions Disp Refills  . lisinopril (PRINIVIL,ZESTRIL) 5 MG tablet 17 tablet 0    Sig: Take 1 tablet (5 mg total) by mouth daily.     Cardiovascular:  ACE Inhibitors Passed - 01/03/2019  5:40 PM      Passed - Cr in normal range and within 180 days    Creat  Date Value Ref Range Status  08/20/2015 0.86 0.70 - 1.25 mg/dL Final   Creatinine, Ser  Date Value Ref Range Status  12/28/2018 0.85 0.76 - 1.27 mg/dL Final         Passed - K in normal range and within 180 days    Potassium  Date Value Ref Range Status  12/28/2018 4.8 3.5 - 5.2 mmol/L Final         Passed - Patient is not pregnant      Passed - Last BP in normal range    BP Readings from Last 1 Encounters:  12/28/18 124/75         Passed - Valid encounter within last 6 months    Recent Outpatient Visits          1 week ago Uncontrolled type 2 diabetes mellitus with hyperglycemia ([place])   Primary Care at [facility], Zoe A, MD   3 weeks ago Type 2 diabetes mellitus with hyperglycemia, without long-term current use of insulin ([place])   Primary Care at [facility], Arlie Solomons, MD   4 weeks ago Annual physical exam   [facility] Care at [facility], [name], MD   5 months ago Cough   Primary Care at [facility], [name], MD   1 year ago Type 2 diabetes mellitus without complication, without long-term current use of insulin [facility])   Primary Care at [facility], [name], MD      Future Appointments            In 2 weeks [name], MD Primary Care at [place], [facility]         . metFORMIN (GLUCOPHAGE) 500 MG tablet 34 tablet 0    Sig: TAKE 1 TABLET BY MOUTH TWICE A DAY WITH A MEAL  Office visit needed     Endocrinology:  Diabetes - Biguanides Failed - 01/03/2019  5:40 PM      Failed - HBA1C is between 0 and 7.9 and within 180 days    Hgb A1c MFr Bld  Date Value Ref Range Status   12/07/2018 13.2 (H) 4.8 - 5.6 % Final    Comment:             Prediabetes: 5.7 - 6.4          Diabetes: >6.4          Glycemic control for adults with diabetes: <7.0          Passed - Cr in normal range and within 360 days    Creat  Date Value Ref Range Status  08/20/2015 0.86 0.70 - 1.25 mg/dL Final   Creatinine, Ser  Date Value Ref Range Status  12/28/2018 0.85 0.76 - 1.27 mg/dL Final         Passed - eGFR in normal range and within 360 days    GFR, Est African American  Date Value Ref Range Status  08/20/2015 >89 >=60 mL/min Final   GFR calc Af Amer  Date Value Ref Range Status  12/28/2018 104 >  59 mL/min/1.73 Final   GFR, Est Non African American  Date Value Ref Range Status  08/20/2015 >89 >=60 mL/min Final    Comment:      The estimated GFR is a calculation valid for adults (>=67 years old) that uses the CKD-EPI algorithm to adjust for age and sex. It is   not to be used for children, pregnant women, hospitalized patients,    patients on dialysis, or with rapidly changing kidney function. According to the NKDEP, eGFR >89 is normal, 60-89 shows mild impairment, 30-59 shows moderate impairment, 15-29 shows severe impairment and <15 is ESRD.      GFR calc non Af Amer  Date Value Ref Range Status  12/28/2018 90 >59 mL/min/1.73 Final         Passed - Valid encounter within last 6 months    Recent Outpatient Visits          1 week ago Uncontrolled type 2 diabetes mellitus with hyperglycemia ([place])   Primary Care at [place], MD   3 weeks ago Type 2 diabetes mellitus with hyperglycemia, without long-term current use of insulin [facility])   Primary Care at [facility], Arlie Solomons, MD   4 weeks ago Annual physical exam   Primary Care at [facility], [name], MD   5 months ago Cough   Primary Care at [facility], [name], MD   1 year ago Type 2 diabetes mellitus without complication, without long-term current use of insulin [facility])   Primary Care  at [facility], [name], MD      Future Appointments            In 2 weeks [name] [name], MD Primary Care at [place], [facility]         . glucose blood (ACCU-CHEK AVIVA) test strip 100 each 12    Sig: Use as instructed     Endocrinology: Diabetes - Testing Supplies Passed - 01/03/2019  5:40 PM      Passed - Valid encounter within last 12 months    Recent Outpatient Visits          1 week ago Uncontrolled type 2 diabetes mellitus with hyperglycemia ([place])   Primary Care at [place], MD   3 weeks ago Type 2 diabetes mellitus with hyperglycemia, without long-term current use of insulin [facility])   Primary Care at [facility], Arlie Solomons, MD   4 weeks ago Annual physical exam   [facility] Care at [facility], [name], MD   5 months ago Cough   Primary Care at [facility], [name], MD   1 year ago Type 2 diabetes mellitus without complication, without long-term current use of insulin [facility])   Primary Care at [facility], [name], MD      Future Appointments            In 2 weeks [name] [name], MD Primary Care at [place], [facility]

## 2019-01-05 ENCOUNTER — Telehealth: Payer: Self-pay | Admitting: Family Medicine

## 2019-01-05 NOTE — Telephone Encounter (Signed)
Spoke with pharmacy and they said everything was resolved.

## 2019-01-05 NOTE — Telephone Encounter (Signed)
Copied from [place] [phone]. Topic: General - Other >> Jan 04, 2019  4:08 PM Yvette Rack wrote: Reason for CRM: Patient wife stated the pharmacy told her that they can fill the Rx until they receive documentation from the physician detailing the number of test strips and how many times daily the patient tests.

## 2019-01-21 ENCOUNTER — Encounter: Payer: Self-pay | Admitting: Family Medicine

## 2019-01-21 ENCOUNTER — Other Ambulatory Visit: Payer: Self-pay

## 2019-01-21 ENCOUNTER — Ambulatory Visit (INDEPENDENT_AMBULATORY_CARE_PROVIDER_SITE_OTHER): Payer: Medicare Other | Admitting: Family Medicine

## 2019-01-21 VITALS — BP 106/68 | HR 82 | Temp 98.2°F | Ht 73.0 in | Wt 262.4 lb

## 2019-01-21 DIAGNOSIS — Z1211 Encounter for screening for malignant neoplasm of colon: Secondary | ICD-10-CM | POA: Diagnosis not present

## 2019-01-21 DIAGNOSIS — E1165 Type 2 diabetes mellitus with hyperglycemia: Secondary | ICD-10-CM

## 2019-01-21 DIAGNOSIS — Z794 Long term (current) use of insulin: Secondary | ICD-10-CM | POA: Diagnosis not present

## 2019-01-21 LAB — GLUCOSE, POCT (MANUAL RESULT ENTRY): POC Glucose: 201 mg/dl — AB (ref 70–99)

## 2019-01-21 MED ORDER — METFORMIN HCL 500 MG PO TABS: ORAL_TABLET | ORAL | 1 refills | Status: DC

## 2019-01-21 NOTE — Patient Instructions (Addendum)
Increase lantus to 18 units for now. Same dose of metformin. Recheck in 2 months. If blood sugars remain over 200, then we need to adjust meds further. Call me or send me a message through Mychart in next few weeks.    Diabetes Mellitus and Nutrition, Adult When you have diabetes (diabetes mellitus), it is very important to have healthy eating habits because your blood sugar (glucose) levels are greatly affected by what you eat and drink. Eating healthy foods in the appropriate amounts, at about the same times every day, can help you:  Control your blood glucose.  Lower your risk of heart disease.  Improve your blood pressure.  Reach or maintain a healthy weight. Every person with diabetes is different, and each person has different needs for a meal plan. Your health care provider may recommend that you work with a diet and nutrition specialist (dietitian) to make a meal plan that is best for you. Your meal plan may vary depending on factors such as:  The calories you need.  The medicines you take.  Your weight.  Your blood glucose, blood pressure, and cholesterol levels.  Your activity level.  Other health conditions you have, such as heart or kidney disease. How do carbohydrates affect me? Carbohydrates, also called carbs, affect your blood glucose level more than any other type of food. Eating carbs naturally raises the amount of glucose in your blood. Carb counting is a method for keeping track of how many carbs you eat. Counting carbs is important to keep your blood glucose at a healthy level, especially if you use insulin or take certain oral diabetes medicines. It is important to know how many carbs you can safely have in each meal. This is different for every person. Your dietitian can help you calculate how many carbs you should have at each meal and for each snack. Foods that contain carbs include:  Bread, cereal, rice, pasta, and crackers.  Potatoes and corn.  Peas,  beans, and lentils.  Milk and yogurt.  Fruit and juice.  Desserts, such as cakes, cookies, ice cream, and candy. How does alcohol affect me? Alcohol can cause a sudden decrease in blood glucose (hypoglycemia), especially if you use insulin or take certain oral diabetes medicines. Hypoglycemia can be a life-threatening condition. Symptoms of hypoglycemia (sleepiness, dizziness, and confusion) are similar to symptoms of having too much alcohol. If your health care provider says that alcohol is safe for you, follow these guidelines:  Limit alcohol intake to no more than 1 drink per day for nonpregnant women and 2 drinks per day for men. One drink equals 12 oz of beer, 5 oz of wine, or 1 oz of hard liquor.  Do not drink on an empty stomach.  Keep yourself hydrated with water, diet soda, or unsweetened iced tea.  Keep in mind that regular soda, juice, and other mixers may contain a lot of sugar and must be counted as carbs. What are tips for following this plan?  Reading food labels  Start by checking the serving size on the "Nutrition Facts" label of packaged foods and drinks. The amount of calories, carbs, fats, and other nutrients listed on the label is based on one serving of the item. Many items contain more than one serving per package.  Check the total grams (g) of carbs in one serving. You can calculate the number of servings of carbs in one serving by dividing the total carbs by 15. For example, if a food has 30   g of total carbs, it would be equal to 2 servings of carbs.  Check the number of grams (g) of saturated and trans fats in one serving. Choose foods that have low or no amount of these fats.  Check the number of milligrams (mg) of salt (sodium) in one serving. Most people should limit total sodium intake to less than 2,300 mg per day.  Always check the nutrition information of foods labeled as "low-fat" or "nonfat". These foods may be higher in added sugar or refined carbs  and should be avoided.  Talk to your dietitian to identify your daily goals for nutrients listed on the label. Shopping  Avoid buying canned, premade, or processed foods. These foods tend to be high in fat, sodium, and added sugar.  Shop around the outside edge of the grocery store. This includes fresh fruits and vegetables, bulk grains, fresh meats, and fresh dairy. Cooking  Use low-heat cooking methods, such as baking, instead of high-heat cooking methods like deep frying.  Cook using healthy oils, such as olive, canola, or sunflower oil.  Avoid cooking with butter, cream, or high-fat meats. Meal planning  Eat meals and snacks regularly, preferably at the same times every day. Avoid going long periods of time without eating.  Eat foods high in fiber, such as fresh fruits, vegetables, beans, and whole grains. Talk to your dietitian about how many servings of carbs you can eat at each meal.  Eat 4-6 ounces (oz) of lean protein each day, such as lean meat, chicken, fish, eggs, or tofu. One oz of lean protein is equal to: ? 1 oz of meat, chicken, or fish. ? 1 egg. ?  cup of tofu.  Eat some foods each day that contain healthy fats, such as avocado, nuts, seeds, and fish. Lifestyle  Check your blood glucose regularly.  Exercise regularly as told by your health care provider. This may include: ? 150 minutes of moderate-intensity or vigorous-intensity exercise each week. This could be brisk walking, biking, or water aerobics. ? Stretching and doing strength exercises, such as yoga or weightlifting, at least 2 times a week.  Take medicines as told by your health care provider.  Do not use any products that contain nicotine or tobacco, such as cigarettes and e-cigarettes. If you need help quitting, ask your health care provider.  Work with a counselor or diabetes educator to identify strategies to manage stress and any emotional and social challenges. Questions to ask a health care  provider  Do I need to meet with a diabetes educator?  Do I need to meet with a dietitian?  What number can I call if I have questions?  When are the best times to check my blood glucose? Where to find more information:  American Diabetes Association: diabetes.org  Academy of Nutrition and Dietetics: www.eatright.org  National Institute of Diabetes and Digestive and Kidney Diseases (NIH): www.niddk.nih.gov Summary  A healthy meal plan will help you control your blood glucose and maintain a healthy lifestyle.  Working with a diet and nutrition specialist (dietitian) can help you make a meal plan that is best for you.  Keep in mind that carbohydrates (carbs) and alcohol have immediate effects on your blood glucose levels. It is important to count carbs and to use alcohol carefully. This information is not intended to replace advice given to you by your health care provider. Make sure you discuss any questions you have with your health care provider. Document Released: 07/17/2005 Document Revised: 05/20/2017 Document Reviewed:   11/24/2016 Elsevier Interactive Patient Education  2019 [employer]   Type 2 Diabetes Mellitus, Self Care, Adult When you have type 2 diabetes (type 2 diabetes mellitus), you must make sure your blood sugar (glucose) stays in a healthy range. You can do this with:  Nutrition.  Exercise.  Lifestyle changes.  Medicines or insulin, if needed.  Support from your doctors and others. How to stay aware of blood sugar   Check your blood sugar level every day, as often as told.  Have your A1c (hemoglobin A1c) level checked two or more times a year. Have it checked more often if your doctor tells you to. Your doctor will set personal treatment goals for you. Generally, you should have these blood sugar levels:  Before meals (preprandial): 80-130 mg/dL (4.4-7.2 mmol/L). Goal initially under 120.   After meals (postprandial): below 180 mg/dL (10  mmol/L).  A1c level: less than 7%. How to manage high and low blood sugar Signs of high blood sugar High blood sugar is called hyperglycemia. Know the signs of high blood sugar. Signs may include:  Feeling: ? Thirsty. ? Hungry. ? Very tired.  Needing to pee (urinate) more than usual.  Blurry vision. Signs of low blood sugar Low blood sugar is called hypoglycemia. This is when blood sugar is at or below 70 mg/dL (3.9 mmol/L). Signs may include:  Feeling: ? Hungry. ? Worried or nervous (anxious). ? Sweaty and clammy. ? Confused. ? Dizzy. ? Sleepy. ? Sick to your stomach (nauseous).  Having: ? A fast heartbeat. ? A headache. ? A change in your vision. ? Jerky movements that you cannot control (seizure). ? Tingling or no feeling (numbness) around your mouth, lips, or tongue.  Having trouble with: ? Moving (coordination). ? Sleeping. ? Passing out (fainting). ? Getting upset easily (irritability). Treating low blood sugar To treat low blood sugar, eat or drink something sugary right away. If you can think clearly and swallow safely, follow the 15:15 rule:  Take 15 grams of a fast-acting carb (carbohydrate). Talk with your doctor about how much you should take.  Some fast-acting carbs are: ? Sugar tablets (glucose pills). Take 3-4 pills. ? 6-8 pieces of hard candy. ? 4-6 oz (120-150 mL) of fruit juice. ? 4-6 oz (120-150 mL) of regular (not diet) soda. ? 1 Tbsp (15 mL) honey or sugar.  Check your blood sugar 15 minutes after you take the carb.  If your blood sugar is still at or below 70 mg/dL (3.9 mmol/L), take 15 grams of a carb again.  If your blood sugar does not go above 70 mg/dL (3.9 mmol/L) after 3 tries, get help right away.  After your blood sugar goes back to normal, eat a meal or a snack within 1 hour. Treating very low blood sugar If your blood sugar is at or below 54 mg/dL (3 mmol/L), you have very low blood sugar (severe hypoglycemia). This is an  emergency. Do not wait to see if the symptoms will go away. Get medical help right away. Call your local emergency services (911 in the [place] If you have very low blood sugar and you cannot eat or drink, you may need a glucagon shot (injection). A family member or friend should learn how to check your blood sugar and how to give you a glucagon shot. Ask your doctor if you need to have a glucagon shot kit at home. Follow these instructions at home: Medicine  Take insulin and diabetes medicines as told.  If   your doctor says you should take more or less insulin and medicines, do this exactly as told.  Do not run out of insulin or medicines. Having diabetes can raise your risk for other long-term conditions. These include heart disease and kidney disease. Your doctor may prescribe medicines to help you not have these problems. Food   Make healthy food choices. These include: ? Chicken, fish, egg whites, and beans. ? Oats, whole wheat, bulgur, brown rice, quinoa, and millet. ? Fresh fruits and vegetables. ? Low-fat dairy products. ? Nuts, avocado, olive oil, and canola oil.  Meet with a food specialist (dietitian). He or she can help you make an eating plan that is right for you.  Follow instructions from your doctor about what you cannot eat or drink.  Drink enough fluid to keep your pee (urine) pale yellow.  Keep track of carbs that you eat. Do this by reading food labels and learning food serving sizes.  Follow your sick day plan when you cannot eat or drink normally. Make this plan with your doctor so it is ready to use. Activity  Exercise 3 or more times a week.  Do not go more than 2 days without exercising.  Talk with your doctor before you start a new exercise. Your doctor may need to tell you to change: ? How much insulin or medicines you take. ? How much food you eat. Lifestyle  Do not use any tobacco products. These include cigarettes, chewing tobacco, and  e-cigarettes. If you need help quitting, ask your doctor.  Ask your doctor how much alcohol is safe for you.  Learn to deal with stress. If you need help with this, ask your doctor. Body care   Stay up to date with your shots (immunizations).  Have your eyes and feet checked by a doctor as often as told.  Check your skin and feet every day. Check for cuts, bruises, redness, blisters, or sores.  Brush your teeth and gums two times a day. Floss one or more times a day.  Go to the dentist one or more times every 6 months.  Stay at a healthy weight. General instructions  Take over-the-counter and prescription medicines only as told by your doctor.  Share your diabetes care plan with: ? Your work or school. ? People you live with.  Carry a card or wear jewelry that says you have diabetes.  Keep all follow-up visits as told by your doctor. This is important. Questions to ask your doctor  Do I need to meet with a diabetes educator?  Where can I find a support group for people with diabetes? Where to find more information To learn more about diabetes, visit:  American Diabetes Association: www.diabetes.org  American Association of Diabetes Educators: www.diabeteseducator.org Summary  When you have type 2 diabetes, you must make sure your blood sugar (glucose) stays in a healthy range.  Check your blood sugar every day, as often as told.  Having diabetes can raise your risk for other conditions. Your doctor may prescribe medicines to help you not have these problems.  Keep all follow-up visits as told by your doctor. This is important. This information is not intended to replace advice given to you by your health care provider. Make sure you discuss any questions you have with your health care provider. Document Released: 02/11/2016 Document Revised: 04/12/2018 Document Reviewed: 11/23/2015 Elsevier Interactive Patient Education  [employer].   If you have lab  work done today you will be  contacted with your lab results within the next 2 weeks.  If you have not heard from [place] then please contact us. The fastest way to get your results is to register for My Chart.   IF you received an x-ray today, you will receive an invoice from [facility]. Please contact [facility] at [phone] with questions or concerns regarding your invoice.   IF you received labwork today, you will receive an invoice from [place]. Please contact [facility] at [phone] with questions or concerns regarding your invoice.   Our billing staff will not be able to assist you with questions regarding bills from these companies.  You will be contacted with the lab results as soon as they are available. The fastest way to get your results is to activate your My Chart account. Instructions are located on the last page of this paperwork. If you have not heard from [place] regarding the results in 2 weeks, please contact this office.

## 2019-01-21 NOTE — Progress Notes (Signed)
Subjective:    Patient ID: Edward Briggs, male    DOB: Oct 27, 1951, 68 y.o.   MRN: [id number]  HPI Edward Briggs is a 68 y.o. male Presents today for: Chief Complaint  Patient presents with  . Diabetes    f/u    Diabetes:  Last seen February 4 for physical, nonadherent/inconsistent use of metformin and had not had recent follow-up for diabetes at that time.  Uncontrolled A1c as below.  Started on Lantus initially 10 units/day, restarted metformin 5108m BID - taking twice per day.  lantus 16 units per day.  Home readings in 150-170.  200's after meals. No symptomatic lows.  Fructosamine 376 at last visit.   Lab Results  Component Value Date   HGBA1C 13.2 (H) 12/07/2018   HGBA1C 14.0 (A) 12/07/2018   HGBA1C 9.0 (H) 11/27/2017   Lab Results  Component Value Date   MICROALBUR 0.7 02/20/2015   LDLCALC 77 12/07/2018   CREATININE 0.85 12/28/2018   Balanitis.  See last visit, suspected balanitis secondary to uncontrolled diabetes, treated with Diflucan 150 mg x 1, then hydrocortisone lotion twice daily, advised 3-day follow-up. Seen by Dr. SNolon RodFebruary 7.  Stable at that time.  Back to normal - no further rash.   Patient Active Problem List   Diagnosis Date Noted  . Type II or unspecified type diabetes mellitus without mention of complication, uncontrolled 09/10/2012  . GASTROESOPHAGEAL REFLUX DISEASE 07/21/2009  . CHEST PAIN 07/21/2009   Past Medical History:  Diagnosis Date  . Arthritis   . Diabetes mellitus without complication [place]   . GERD (gastroesophageal reflux disease)    no meds   Past Surgical History:  Procedure Laterality Date  . ABSCESS DRAINAGE    . FRACTURE SURGERY    . HARDWARE REMOVAL  03/26/2012   Procedure: HARDWARE REMOVAL;  Surgeon: PColin Rhein MD;  [place] [facility]  Service: Orthopedics;  Laterality: Left;  hardware removal deep left ankle syndesmotic screws only and stress xrays ankle   . ORIF ANKLE FRACTURE   12/03/2011   Procedure: OPEN REDUCTION INTERNAL FIXATION (ORIF) ANKLE FRACTURE;  Surgeon: PColin Rhein MD;  [facility] [facility]  Service: Orthopedics;  Laterality: Left;  left medial malleolus fracture  . PILONIDAL CYST EXCISION  ~1982  . SKIN GRAFT  ~1970   Lt great toe (graft from Lt thigh)   No Known Allergies Prior to Admission medications   Medication Sig Start Date End Date Taking? Authorizing Provider  Ascorbic Acid (VITAMIN C PO) Take by mouth.   Yes [provider]  aspirin 81 MG tablet Take 81 mg by mouth daily.   Yes [provider]  Blood Glucose Monitoring Suppl (BLOOD GLUCOSE METER) kit Use as instructed 09/10/12  Yes HPosey Boyer MD  glucose blood (ACCU-CHEK AVIVA) test strip Use as instructed-  2-3 time a day 01/04/19  Yes GWendie Agreste MD  hydrocortisone 1 % lotion Apply 1 application topically 2 (two) times daily. 12/07/18  Yes GWendie Agreste MD  Insulin Glargine (LANTUS SOLOSTAR) 100 UNIT/ML Solostar Pen Inject 10 Units into the skin daily. 12/07/18  Yes GWendie Agreste MD  Insulin Pen Needle (PEN NEEDLES) 32G X 4 MM MISC 1 application by Does not apply route daily. 12/07/18  Yes GWendie Agreste MD  lisinopril (PRINIVIL,ZESTRIL) 5 MG tablet Take 1 tablet (5 mg total) by mouth daily. 01/04/19  Yes GWendie Agreste MD  metFORMIN (GLUCOPHAGE) 500 MG tablet  TAKE 1 TABLET BY MOUTH TWICE A DAY WITH A MEAL  Office visit needed 01/04/19  Yes [name], MD   Social History   Socioeconomic History  . Marital status: Married    Spouse name: Not on file  . Number of children: 2  . Years of education: Not on file  . Highest education level: Not on file  Occupational History  . Not on file  Social Needs  . Financial resource strain: Not on file  . Food insecurity:    Worry: Not on file    Inability: Not on file  . Transportation needs:    Medical: Not on file    Non-medical: Not on file  Tobacco Use  . Smoking status:  Former Smoker    Last attempt to quit: 10/30/1988    Years since quitting: 30.2  . Smokeless tobacco: Never Used  Substance and Sexual Activity  . Alcohol use: Yes    Alcohol/week: 1.0 standard drinks    Types: 1 Shots of liquor per week    Comment: every two weeks  . Drug use: No  . Sexual activity: Yes    Birth control/protection: None  Lifestyle  . Physical activity:    Days per week: Not on file    Minutes per session: Not on file  . Stress: Not on file  Relationships  . Social connections:    Talks on phone: Not on file    Gets together: Not on file    Attends religious service: Not on file    Active member of club or organization: Not on file    Attends meetings of clubs or organizations: Not on file    Relationship status: Not on file  . Intimate partner violence:    Fear of current or ex partner: Not on file    Emotionally abused: Not on file    Physically abused: Not on file    Forced sexual activity: Not on file  Other Topics Concern  . Not on file  Social History Narrative  . Not on file    Review of Systems  Constitutional: Negative for fatigue and unexpected weight change.  Eyes: Negative for visual disturbance.  Respiratory: Negative for cough, chest tightness and shortness of breath.   Cardiovascular: Negative for chest pain, palpitations and leg swelling.  Gastrointestinal: Negative for abdominal pain and blood in stool.  Neurological: Negative for dizziness, light-headedness and headaches.   Per HPI     Objective:   Physical Exam Vitals signs reviewed.  Constitutional:      Appearance: He is well-developed.  HENT:     Head: Normocephalic and atraumatic.  Eyes:     Pupils: Pupils are equal, round, and reactive to light.  Neck:     Vascular: No carotid bruit or JVD.  Cardiovascular:     Rate and Rhythm: Normal rate and regular rhythm.     Heart sounds: Normal heart sounds. No murmur.  Pulmonary:     Effort: Pulmonary effort is normal.      Breath sounds: Normal breath sounds. No rales.  Skin:    General: Skin is warm and dry.  Neurological:     Mental Status: He is alert and oriented to person, place, and time.    Vitals:   01/21/19 1013  BP: 106/68  Pulse: 82  Temp: 98.2 F (36.8 C)  TempSrc: Oral  SpO2: 97%  Weight: 262 lb 6.4 oz (119 kg)  Height: _0  (1.854 m)   Results  for orders placed or performed in visit on 01/21/19  POCT glucose (manual entry)  Result Value Ref Range   POC Glucose 201 (A) 70 - 99 mg/dl      Assessment & Plan:    MILAN PERKINS is a 68 y.o. male Type 2 diabetes mellitus with hyperglycemia, with long-term current use of insulin ([place]) - Plan: POCT glucose (manual entry), metFORMIN (GLUCOPHAGE) 500 MG tablet  -Improving but still some elevated home readings.  Increase Lantus to 18 units/day, continue metformin same dose for now.  Monitor home readings and if persistent readings in the 200s advised to call or MyChart message and we can adjust regimen further.  Recheck 2 months.  Screen for colon cancer - Plan: Ambulatory referral to Gastroenterology   Meds ordered this encounter  Medications  . metFORMIN (GLUCOPHAGE) 500 MG tablet    Sig: TAKE 1 TABLET BY MOUTH TWICE A DAY WITH A MEAL    Dispense:  180 tablet    Refill:  1   Patient Instructions    Increase lantus to 18 units for now. Same dose of metformin. Recheck in 2 months. If blood sugars remain over 200, then we need to adjust meds further. Call me or send me a message through Mychart in next few weeks.    Diabetes Mellitus and Nutrition, Adult When you have diabetes (diabetes mellitus), it is very important to have healthy eating habits because your blood sugar (glucose) levels are greatly affected by what you eat and drink. Eating healthy foods in the appropriate amounts, at about the same times every day, can help you:  Control your blood glucose.  Lower your risk of heart disease.  Improve your blood pressure.   Reach or maintain a healthy weight. Every person with diabetes is different, and each person has different needs for a meal plan. Your health care provider may recommend that you work with a diet and nutrition specialist (dietitian) to make a meal plan that is best for you. Your meal plan may vary depending on factors such as:  The calories you need.  The medicines you take.  Your weight.  Your blood glucose, blood pressure, and cholesterol levels.  Your activity level.  Other health conditions you have, such as heart or kidney disease. How do carbohydrates affect me? Carbohydrates, also called carbs, affect your blood glucose level more than any other type of food. Eating carbs naturally raises the amount of glucose in your blood. Carb counting is a method for keeping track of how many carbs you eat. Counting carbs is important to keep your blood glucose at a healthy level, especially if you use insulin or take certain oral diabetes medicines. It is important to know how many carbs you can safely have in each meal. This is different for every person. Your dietitian can help you calculate how many carbs you should have at each meal and for each snack. Foods that contain carbs include:  Bread, cereal, rice, pasta, and crackers.  Potatoes and corn.  Peas, beans, and lentils.  Milk and yogurt.  Fruit and juice.  Desserts, such as cakes, cookies, ice cream, and candy. How does alcohol affect me? Alcohol can cause a sudden decrease in blood glucose (hypoglycemia), especially if you use insulin or take certain oral diabetes medicines. Hypoglycemia can be a life-threatening condition. Symptoms of hypoglycemia (sleepiness, dizziness, and confusion) are similar to symptoms of having too much alcohol. If your health care provider says that alcohol is safe for you,  follow these guidelines:  Limit alcohol intake to no more than 1 drink per day for nonpregnant women and 2 drinks per day for men.  One drink equals 12 oz of beer, 5 oz of wine, or 1 oz of hard liquor.  Do not drink on an empty stomach.  Keep yourself hydrated with water, diet soda, or unsweetened iced tea.  Keep in mind that regular soda, juice, and other mixers may contain a lot of sugar and must be counted as carbs. What are tips for following this plan?  Reading food labels  Start by checking the serving size on the "Nutrition Facts" label of packaged foods and drinks. The amount of calories, carbs, fats, and other nutrients listed on the label is based on one serving of the item. Many items contain more than one serving per package.  Check the total grams (g) of carbs in one serving. You can calculate the number of servings of carbs in one serving by dividing the total carbs by 15. For example, if a food has 30 g of total carbs, it would be equal to 2 servings of carbs.  Check the number of grams (g) of saturated and trans fats in one serving. Choose foods that have low or no amount of these fats.  Check the number of milligrams (mg) of salt (sodium) in one serving. Most people should limit total sodium intake to less than 2,300 mg per day.  Always check the nutrition information of foods labeled as "low-fat" or "nonfat". These foods may be higher in added sugar or refined carbs and should be avoided.  Talk to your dietitian to identify your daily goals for nutrients listed on the label. Shopping  Avoid buying canned, premade, or processed foods. These foods tend to be high in fat, sodium, and added sugar.  Shop around the outside edge of the grocery store. This includes fresh fruits and vegetables, bulk grains, fresh meats, and fresh dairy. Cooking  Use low-heat cooking methods, such as baking, instead of high-heat cooking methods like deep frying.  Cook using healthy oils, such as olive, canola, or sunflower oil.  Avoid cooking with butter, cream, or high-fat meats. Meal planning  Eat meals and snacks  regularly, preferably at the same times every day. Avoid going long periods of time without eating.  Eat foods high in fiber, such as fresh fruits, vegetables, beans, and whole grains. Talk to your dietitian about how many servings of carbs you can eat at each meal.  Eat 4-6 ounces (oz) of lean protein each day, such as lean meat, chicken, fish, eggs, or tofu. One oz of lean protein is equal to: ? 1 oz of meat, chicken, or fish. ? 1 egg. ?  cup of tofu.  Eat some foods each day that contain healthy fats, such as avocado, nuts, seeds, and fish. Lifestyle  Check your blood glucose regularly.  Exercise regularly as told by your health care provider. This may include: ? 150 minutes of moderate-intensity or vigorous-intensity exercise each week. This could be brisk walking, biking, or water aerobics. ? Stretching and doing strength exercises, such as yoga or weightlifting, at least 2 times a week.  Take medicines as told by your health care provider.  Do not use any products that contain nicotine or tobacco, such as cigarettes and e-cigarettes. If you need help quitting, ask your health care provider.  Work with a Social worker or diabetes educator to identify strategies to manage stress and any emotional and social challenges.  Questions to ask a health care provider  Do I need to meet with a diabetes educator?  Do I need to meet with a dietitian?  What number can I call if I have questions?  When are the best times to check my blood glucose? Where to find more information:  American Diabetes Association: diabetes.org  Academy of Nutrition and Dietetics: www.eatright.[employer] of Diabetes and Digestive and Kidney Diseases (NIH): [employer] Summary  A healthy meal plan will help you control your blood glucose and maintain a healthy lifestyle.  Working with a diet and nutrition specialist (dietitian) can help you make a meal plan that is best for you.  Keep in  mind that carbohydrates (carbs) and alcohol have immediate effects on your blood glucose levels. It is important to count carbs and to use alcohol carefully. This information is not intended to replace advice given to you by your health care provider. Make sure you discuss any questions you have with your health care provider. Document Released: 07/17/2005 Document Revised: 05/20/2017 Document Reviewed: 11/24/2016 Elsevier Interactive Patient Education  2019 [place].   Type 2 Diabetes Mellitus, Self Care, Adult When you have type 2 diabetes (type 2 diabetes mellitus), you must make sure your blood sugar (glucose) stays in a healthy range. You can do this with:  Nutrition.  Exercise.  Lifestyle changes.  Medicines or insulin, if needed.  Support from your doctors and others. How to stay aware of blood sugar   Check your blood sugar level every day, as often as told.  Have your A1c (hemoglobin A1c) level checked two or more times a year. Have it checked more often if your doctor tells you to. Your doctor will set personal treatment goals for you. Generally, you should have these blood sugar levels:  Before meals (preprandial): 80-130 mg/dL (4.4-7.2 mmol/L). Goal initially under 120.   After meals (postprandial): below 180 mg/dL (10 mmol/L).  A1c level: less than 7%. How to manage high and low blood sugar Signs of high blood sugar High blood sugar is called hyperglycemia. Know the signs of high blood sugar. Signs may include:  Feeling: ? Thirsty. ? Hungry. ? Very tired.  Needing to pee (urinate) more than usual.  Blurry vision. Signs of low blood sugar Low blood sugar is called hypoglycemia. This is when blood sugar is at or below 70 mg/dL (3.9 mmol/L). Signs may include:  Feeling: ? Hungry. ? Worried or nervous (anxious). ? Sweaty and clammy. ? Confused. ? Dizzy. ? Sleepy. ? Sick to your stomach (nauseous).  Having: ? A fast heartbeat. ? A headache. ? A  change in your vision. ? Jerky movements that you cannot control (seizure). ? Tingling or no feeling (numbness) around your mouth, lips, or tongue.  Having trouble with: ? Moving (coordination). ? Sleeping. ? Passing out (fainting). ? Getting upset easily (irritability). Treating low blood sugar To treat low blood sugar, eat or drink something sugary right away. If you can think clearly and swallow safely, follow the 15:15 rule:  Take 15 grams of a fast-acting carb (carbohydrate). Talk with your doctor about how much you should take.  Some fast-acting carbs are: ? Sugar tablets (glucose pills). Take 3-4 pills. ? 6-8 pieces of hard candy. ? 4-6 oz (120-150 mL) of fruit juice. ? 4-6 oz (120-150 mL) of regular (not diet) soda. ? 1 Tbsp (15 mL) honey or sugar.  Check your blood sugar 15 minutes after you take the carb.  If your blood  sugar is still at or below 70 mg/dL (3.9 mmol/L), take 15 grams of a carb again.  If your blood sugar does not go above 70 mg/dL (3.9 mmol/L) after 3 tries, get help right away.  After your blood sugar goes back to normal, eat a meal or a snack within 1 hour. Treating very low blood sugar If your blood sugar is at or below 54 mg/dL (3 mmol/L), you have very low blood sugar (severe hypoglycemia). This is an emergency. Do not wait to see if the symptoms will go away. Get medical help right away. Call your local emergency services (911 in the [place] If you have very low blood sugar and you cannot eat or drink, you may need a glucagon shot (injection). A family member or friend should learn how to check your blood sugar and how to give you a glucagon shot. Ask your doctor if you need to have a glucagon shot kit at home. Follow these instructions at home: Medicine  Take insulin and diabetes medicines as told.  If your doctor says you should take more or less insulin and medicines, do this exactly as told.  Do not run out of insulin or medicines. Having  diabetes can raise your risk for other long-term conditions. These include heart disease and kidney disease. Your doctor may prescribe medicines to help you not have these problems. Food   Make healthy food choices. These include: ? Chicken, fish, egg whites, and beans. ? Oats, whole wheat, bulgur, brown rice, quinoa, and millet. ? Fresh fruits and vegetables. ? Low-fat dairy products. ? Nuts, avocado, olive oil, and canola oil.  Meet with a food specialist (dietitian). He or she can help you make an eating plan that is right for you.  Follow instructions from your doctor about what you cannot eat or drink.  Drink enough fluid to keep your pee (urine) pale yellow.  Keep track of carbs that you eat. Do this by reading food labels and learning food serving sizes.  Follow your sick day plan when you cannot eat or drink normally. Make this plan with your doctor so it is ready to use. Activity  Exercise 3 or more times a week.  Do not go more than 2 days without exercising.  Talk with your doctor before you start a new exercise. Your doctor may need to tell you to change: ? How much insulin or medicines you take. ? How much food you eat. Lifestyle  Do not use any tobacco products. These include cigarettes, chewing tobacco, and e-cigarettes. If you need help quitting, ask your doctor.  Ask your doctor how much alcohol is safe for you.  Learn to deal with stress. If you need help with this, ask your doctor. Body care   Stay up to date with your shots (immunizations).  Have your eyes and feet checked by a doctor as often as told.  Check your skin and feet every day. Check for cuts, bruises, redness, blisters, or sores.  Brush your teeth and gums two times a day. Floss one or more times a day.  Go to the dentist one or more times every 6 months.  Stay at a healthy weight. General instructions  Take over-the-counter and prescription medicines only as told by your doctor.   Share your diabetes care plan with: ? Your work or school. ? People you live with.  Carry a card or wear jewelry that says you have diabetes.  Keep all follow-up visits as told by  your doctor. This is important. Questions to ask your doctor  Do I need to meet with a diabetes educator?  Where can I find a support group for people with diabetes? Where to find more information To learn more about diabetes, visit:  American Diabetes Association: www.diabetes.org  American Association of Diabetes Educators: www.diabeteseducator.org Summary  When you have type 2 diabetes, you must make sure your blood sugar (glucose) stays in a healthy range.  Check your blood sugar every day, as often as told.  Having diabetes can raise your risk for other conditions. Your doctor may prescribe medicines to help you not have these problems.  Keep all follow-up visits as told by your doctor. This is important. This information is not intended to replace advice given to you by your health care provider. Make sure you discuss any questions you have with your health care provider. Document Released: 02/11/2016 Document Revised: 04/12/2018 Document Reviewed: 11/23/2015 Elsevier Interactive Patient Education  [employer].   If you have lab work done today you will be contacted with your lab results within the next 2 weeks.  If you have not heard from [place] then please contact us. The fastest way to get your results is to register for My Chart.   IF you received an x-ray today, you will receive an invoice from [facility]. Please contact [facility] at [phone] with questions or concerns regarding your invoice.   IF you received labwork today, you will receive an invoice from [place]. Please contact [facility] at [phone] with questions or concerns regarding your invoice.   Our billing staff will not be able to assist you with questions regarding bills from these companies.   You will be contacted with the lab results as soon as they are available. The fastest way to get your results is to activate your My Chart account. Instructions are located on the last page of this paperwork. If you have not heard from [place] regarding the results in 2 weeks, please contact this office.       Signed,   [name], MD [facility].  01/23/19 10:57 PM

## 2019-01-23 ENCOUNTER — Encounter: Payer: Self-pay | Admitting: Family Medicine

## 2019-01-30 ENCOUNTER — Other Ambulatory Visit: Payer: Self-pay | Admitting: Family Medicine

## 2019-01-30 DIAGNOSIS — I10 Essential (primary) hypertension: Secondary | ICD-10-CM

## 2019-02-16 ENCOUNTER — Other Ambulatory Visit: Payer: Self-pay

## 2019-02-16 ENCOUNTER — Ambulatory Visit (INDEPENDENT_AMBULATORY_CARE_PROVIDER_SITE_OTHER): Payer: Medicare Other | Admitting: Podiatry

## 2019-02-16 ENCOUNTER — Encounter: Payer: Self-pay | Admitting: Podiatry

## 2019-02-16 DIAGNOSIS — E1142 Type 2 diabetes mellitus with diabetic polyneuropathy: Secondary | ICD-10-CM | POA: Diagnosis not present

## 2019-02-16 DIAGNOSIS — M79674 Pain in right toe(s): Secondary | ICD-10-CM

## 2019-02-16 DIAGNOSIS — B351 Tinea unguium: Secondary | ICD-10-CM

## 2019-02-16 DIAGNOSIS — M79675 Pain in left toe(s): Secondary | ICD-10-CM | POA: Diagnosis not present

## 2019-02-17 ENCOUNTER — Encounter: Payer: Self-pay | Admitting: Podiatry

## 2019-02-17 NOTE — Progress Notes (Signed)
Subjective: Edward Briggs presents today with painful, thick toenails 1-5 b/l that he cannot cut and which interfere with daily activities.  Pain is aggravated when wearing enclosed shoe gear.  [name], MD is his PCP and last visit was 01/21/2019.   Current Outpatient Medications:  .  Ascorbic Acid (VITAMIN C PO), Take by mouth., Disp: , Rfl:  .  aspirin 81 MG tablet, Take 81 mg by mouth daily., Disp: , Rfl:  .  Blood Glucose Monitoring Suppl (BLOOD GLUCOSE METER) kit, Use as instructed, Disp: 1 each, Rfl: 0 .  glucose blood (ACCU-CHEK AVIVA) test strip, Use as instructed-  2-3 time a day, Disp: 100 each, Rfl: 12 .  hydrocortisone 1 % lotion, Apply 1 application topically 2 (two) times daily., Disp: 118 mL, Rfl: 0 .  Insulin Glargine (LANTUS SOLOSTAR) 100 UNIT/ML Solostar Pen, Inject 10 Units into the skin daily., Disp: 5 pen, Rfl: 1 .  Insulin Pen Needle (PEN NEEDLES) 32G X 4 MM MISC, 1 application by Does not apply route daily., Disp: 100 each, Rfl: 2 .  lisinopril (PRINIVIL,ZESTRIL) 5 MG tablet, Take 1 tablet (5 mg total) by mouth daily., Disp: 90 tablet, Rfl: 0 .  metFORMIN (GLUCOPHAGE) 500 MG tablet, TAKE 1 TABLET BY MOUTH TWICE A DAY WITH A MEAL, Disp: 180 tablet, Rfl: 1  No Known Allergies  Objective:  Vascular Examination: Capillary refill time immediate x 10 digits.  Dorsalis pedis 2/4 b/l.  Posterior tibial pulses 1/4  b/l  Digital hair decreased x 10 digits  Skin temperature gradient WNL b/l  Dermatological Examination: Skin with normal turgor, texture and tone b/l  Toenails 1-5 b/l discolored, thick, dystrophic with subungual debris and pain with palpation to nailbeds due to thickness of nails.  Musculoskeletal: Muscle strength 5/5 to all LE muscle groups  No gross bony deformities b/l.  No pain, crepitus or joint limitation noted with ROM.   Neurological: Sensation intact with 10 gram monofilament.  Vibratory sensation decreased  b/l.  Assessment: Painful onychomycosis toenails 1-5 b/l  NIDDM with early neuropathy.  Plan: 1. Toenails 1-5 b/l were debrided in length and girth without iatrogenic bleeding. 2. Patient to continue soft, supportive shoe gear daily. 3. Patient to report any pedal injuries to medical professional immediately. 4. Follow up 3 months.  5. Patient/POA to call should there be a concern in the interim.

## 2019-03-21 ENCOUNTER — Other Ambulatory Visit: Payer: Self-pay

## 2019-03-21 DIAGNOSIS — E1165 Type 2 diabetes mellitus with hyperglycemia: Secondary | ICD-10-CM

## 2019-03-22 ENCOUNTER — Encounter: Payer: Self-pay | Admitting: Family Medicine

## 2019-03-22 ENCOUNTER — Other Ambulatory Visit: Payer: Self-pay

## 2019-03-22 ENCOUNTER — Ambulatory Visit (INDEPENDENT_AMBULATORY_CARE_PROVIDER_SITE_OTHER): Payer: Medicare Other | Admitting: Family Medicine

## 2019-03-22 ENCOUNTER — Telehealth (INDEPENDENT_AMBULATORY_CARE_PROVIDER_SITE_OTHER): Payer: Medicare Other | Admitting: Family Medicine

## 2019-03-22 DIAGNOSIS — Z794 Long term (current) use of insulin: Secondary | ICD-10-CM | POA: Diagnosis not present

## 2019-03-22 DIAGNOSIS — E1165 Type 2 diabetes mellitus with hyperglycemia: Secondary | ICD-10-CM

## 2019-03-22 LAB — POCT GLYCOSYLATED HEMOGLOBIN (HGB A1C): Hemoglobin A1C: 8.6 % — AB (ref 4.0–5.6)

## 2019-03-22 MED ORDER — PEN NEEDLES 32G X 4 MM MISC: 1.0000 "application " | Freq: Every day | 2 refills | Status: DC

## 2019-03-22 MED ORDER — METFORMIN HCL 850 MG PO TABS: 850.0000 mg | ORAL_TABLET | Freq: Two times a day (BID) | ORAL | 1 refills | Status: DC

## 2019-03-22 MED ORDER — INSULIN GLARGINE 100 UNIT/ML SOLOSTAR PEN: 20.0000 [IU] | PEN_INJECTOR | Freq: Every day | SUBCUTANEOUS | 1 refills | Status: DC

## 2019-03-22 NOTE — Progress Notes (Signed)
Pt c/o Diabetes and needs needles for lantus pen.

## 2019-03-22 NOTE — Patient Instructions (Signed)
Increase lantus to 20 units per day, and increase metformin dose to 850 mg twice per day.  Watch for low blood sugar symptoms at these higher doses of medicines and follow-up in 3 months.  See precautions below on hypo-glycemia or low blood sugars.  If those occur please let me know.    Hypoglycemia Hypoglycemia occurs when the level of sugar (glucose) in the blood is too low. Hypoglycemia can happen in people who do or do not have diabetes. It can develop quickly, and it can be a medical emergency. For most people with diabetes, a blood glucose level below 70 mg/dL (3.9 mmol/L) is considered hypoglycemia. Glucose is a type of sugar that provides the body's main source of energy. Certain hormones (insulin and glucagon) control the level of glucose in the blood. Insulin lowers blood glucose, and glucagon raises blood glucose. Hypoglycemia can result from having too much insulin in the bloodstream, or from not eating enough food that contains glucose. You may also have reactive hypoglycemia, which happens within 4 hours after eating a meal. What are the causes? Hypoglycemia occurs most often in people who have diabetes and may be caused by:  Diabetes medicine.  Not eating enough, or not eating often enough.  Increased physical activity.  Drinking alcohol on an empty stomach. If you do not have diabetes, hypoglycemia may be caused by:  A tumor in the pancreas.  Not eating enough, or not eating for long periods at a time (fasting).  A severe infection or illness.  Certain medicines. What increases the risk? Hypoglycemia is more likely to develop in:  People who have diabetes and take medicines to lower blood glucose.  People who abuse alcohol.  People who have a severe illness. What are the signs or symptoms? Mild symptoms Mild hypoglycemia may not cause any symptoms. If you do have symptoms, they may include:  Hunger.  Anxiety.  Sweating and feeling clammy.  Dizziness or  feeling light-headed.  Sleepiness.  Nausea.  Increased heart rate.  Headache.  Blurry vision.  Irritability.  Tingling or numbness around the mouth, lips, or tongue.  A change in coordination.  Restless sleep. Moderate symptoms Moderate hypoglycemia can cause:  Mental confusion and poor judgment.  Behavior changes.  Weakness.  Irregular heartbeat. Severe symptoms Severe hypoglycemia is a medical emergency. It can cause:  Fainting.  Seizures.  Loss of consciousness (coma).  Death. How is this diagnosed? Hypoglycemia is diagnosed with a blood test to measure your blood glucose level. This blood test is done while you are having symptoms. Your health care provider may also do a physical exam and review your medical history. How is this treated? This condition can often be treated by immediately eating or drinking something that contains sugar, such as:  Fruit juice, 4-6 oz (120-150 mL).  Regular soda (not diet soda), 4-6 oz (120-150 mL).  Low-fat milk, 4 oz (120 mL).  Several pieces of hard candy.  Sugar or honey, 1 Tbsp (15 mL). Treating hypoglycemia if you have diabetes If you are alert and able to swallow safely, follow the 15:15 rule:  Take 15 grams of a rapid-acting carbohydrate. Talk with your health care provider about how much you should take.  Rapid-acting options include: ? Glucose pills (take 15 grams). ? 6-8 pieces of hard candy. ? 4-6 oz (120-150 mL) of fruit juice. ? 4-6 oz (120-150 mL) of regular (not diet) soda. ? 1 Tbsp (15 mL) honey or sugar.  Check your blood glucose 15 minutes  after you take the carbohydrate.  If the repeat blood glucose level is still at or below 70 mg/dL (3.9 mmol/L), take 15 grams of a carbohydrate again.  If your blood glucose level does not increase above 70 mg/dL (3.9 mmol/L) after 3 tries, seek emergency medical care.  After your blood glucose level returns to normal, eat a meal or a snack within 1 hour.   Treating severe hypoglycemia Severe hypoglycemia is when your blood glucose level is at or below 54 mg/dL (3 mmol/L). Severe hypoglycemia is a medical emergency. Get medical help right away. If you have severe hypoglycemia and you cannot eat or drink, you may need an injection of glucagon. A family member or close friend should learn how to check your blood glucose and how to give you a glucagon injection. Ask your health care provider if you need to have an emergency glucagon injection kit available. Severe hypoglycemia may need to be treated in a hospital. The treatment may include getting glucose through an IV. You may also need treatment for the cause of your hypoglycemia. Follow these instructions at home:  General instructions  Take over-the-counter and prescription medicines only as told by your health care provider.  Monitor your blood glucose as told by your health care provider.  Limit alcohol intake to no more than 1 drink a day for nonpregnant women and 2 drinks a day for men. One drink equals 12 oz of beer (355 mL), 5 oz of wine (148 mL), or 1 oz of hard liquor (44 mL).  Keep all follow-up visits as told by your health care provider. This is important. If you have diabetes:  Always have a rapid-acting carbohydrate snack with you to treat low blood glucose.  Follow your diabetes management plan as directed. Make sure you: ? Know the symptoms of hypoglycemia. It is important to treat it right away to prevent it from becoming severe. ? Take your medicines as directed. ? Follow your exercise plan. ? Follow your meal plan. Eat on time, and do not skip meals. ? Check your blood glucose as often as directed. Always check before and after exercise. ? Follow your sick day plan whenever you cannot eat or drink normally. Make this plan in advance with your health care provider.  Share your diabetes management plan with people in your workplace, school, and household.  Check your  urine for ketones when you are ill and as told by your health care provider.  Carry a medical alert card or wear medical alert jewelry. Contact a health care provider if:  You have problems keeping your blood glucose in your target range.  You have frequent episodes of hypoglycemia. Get help right away if:  You continue to have hypoglycemia symptoms after eating or drinking something containing glucose.  Your blood glucose is at or below 54 mg/dL (3 mmol/L).  You have a seizure.  You faint. These symptoms may represent a serious problem that is an emergency. Do not wait to see if the symptoms will go away. Get medical help right away. Call your local emergency services (911 in the [place] Summary  Hypoglycemia occurs when the level of sugar (glucose) in the blood is too low.  Hypoglycemia can happen in people who do or do not have diabetes. It can develop quickly, and it can be a medical emergency.  Make sure you know the symptoms of hypoglycemia and how to treat it.  Always have a rapid-acting carbohydrate snack with you to treat low  blood sugar. This information is not intended to replace advice given to you by your health care provider. Make sure you discuss any questions you have with your health care provider. Document Released: 10/20/2005 Document Revised: 04/13/2018 Document Reviewed: 11/23/2015 Elsevier Interactive Patient Education  2019 [employer].

## 2019-03-22 NOTE — Progress Notes (Signed)
Virtual Visit via Telephone Note  I connected with Edward Briggs on 03/22/19 at 9:43 AM by telephone and verified that I am speaking with the correct person using two identifiers.   I discussed the limitations, risks, security and privacy concerns of performing an evaluation and management service by telephone and the availability of in person appointments. I also discussed with the patient that there may be a patient responsible charge related to this service. The patient expressed understanding and agreed to proceed, consent obtained  Chief complaint:  Diabetes.  History of Present Illness: Edward Briggs is a 68 y.o. male  Diabetes:  Now insulin-dependent.  Improving at March visit but still some elevated readings at that time, Lantus was increased to 18 units/day, continued metformin same dose. Microalbumin/creat ratio - normal in February.  Optho, foot exam, pneumovax: On ace inhibitor - lisinopril 53m qd.  Home readings 169, 154, 189. No symptomatic lows.  On lantus 18 units per day, metformin 5047mBID. No GI side effects of metformin at current dose.  Exercise: walking - 3 days per week.   No chest pain, lightheadedness, dizziness.   Lab Results  Component Value Date   HGBA1C 8.6 (A) 03/22/2019   HGBA1C 13.2 (H) 12/07/2018   HGBA1C 14.0 (A) 12/07/2018   Lab Results  Component Value Date   MICROALBUR 0.7 02/20/2015   LDLCALC 77 12/07/2018   CREATININE 0.85 12/28/2018     Patient Active Problem List   Diagnosis Date Noted  . Type II or unspecified type diabetes mellitus without mention of complication, uncontrolled 09/10/2012  . GASTROESOPHAGEAL REFLUX DISEASE 07/21/2009  . CHEST PAIN 07/21/2009   Past Medical History:  Diagnosis Date  . Arthritis   . Diabetes mellitus without complication ([facility]  . GERD (gastroesophageal reflux disease)    no meds   Past Surgical History:  Procedure Laterality Date  . ABSCESS DRAINAGE    . FRACTURE SURGERY    .  HARDWARE REMOVAL  03/26/2012   Procedure: HARDWARE REMOVAL;  Surgeon: PaColin RheinMD;  [facility];  Laterality: Left;  hardware removal deep left ankle syndesmotic screws only and stress xrays ankle   . ORIF ANKLE FRACTURE  12/03/2011   Procedure: OPEN REDUCTION INTERNAL FIXATION (ORIF) ANKLE FRACTURE;  Surgeon: PaColin RheinMD;  [place] [facility];  Laterality: Left;  left medial malleolus fracture  . PILONIDAL CYST EXCISION  ~1982  . SKIN GRAFT  ~1970   Lt great toe (graft from Lt thigh)   No Known Allergies Prior to Admission medications   Medication Sig Start Date End Date Taking? Authorizing Provider  Ascorbic Acid (VITAMIN C PO) Take by mouth.   Yes [provider]  aspirin 81 MG tablet Take 81 mg by mouth daily.   Yes [provider]  Blood Glucose Monitoring Suppl (BLOOD GLUCOSE METER) kit Use as instructed 09/10/12  Yes HoPosey BoyerMD  glucose blood (ACCU-CHEK AVIVA) test strip Use as instructed-  2-3 time a day 01/04/19  Yes GrWendie AgresteMD  hydrocortisone 1 % lotion Apply 1 application topically 2 (two) times daily. 12/07/18  Yes GrWendie AgresteMD  Insulin Glargine (LANTUS SOLOSTAR) 100 UNIT/ML Solostar Pen Inject 10 Units into the skin daily. 12/07/18  Yes GrWendie AgresteMD  Insulin Pen Needle (PEN NEEDLES) 32G X 4 MM MISC 1 application by Does not apply route daily. 12/07/18  Yes GrCarlota Briggs  [name], MD  lisinopril (PRINIVIL,ZESTRIL) 5 MG tablet Take 1 tablet (5 mg total) by mouth daily. 01/30/19  Yes Edward Agreste, MD  metFORMIN (GLUCOPHAGE) 500 MG tablet TAKE 1 TABLET BY MOUTH TWICE A DAY WITH A MEAL 01/21/19  Yes Edward Agreste, MD   Social History   Socioeconomic History  . Marital status: Married    Spouse name: Not on file  . Number of children: 2  . Years of education: Not on file  . Highest education level: Not on file  Occupational History  .  Not on file  Social Needs  . Financial resource strain: Not on file  . Food insecurity:    Worry: Not on file    Inability: Not on file  . Transportation needs:    Medical: Not on file    Non-medical: Not on file  Tobacco Use  . Smoking status: Former Smoker    Last attempt to quit: 10/30/1988    Years since quitting: 30.4  . Smokeless tobacco: Never Used  Substance and Sexual Activity  . Alcohol use: Yes    Alcohol/week: 1.0 standard drinks    Types: 1 Shots of liquor per week    Comment: every two weeks  . Drug use: No  . Sexual activity: Yes    Birth control/protection: None  Lifestyle  . Physical activity:    Days per week: Not on file    Minutes per session: Not on file  . Stress: Not on file  Relationships  . Social connections:    Talks on phone: Not on file    Gets together: Not on file    Attends religious service: Not on file    Active member of club or organization: Not on file    Attends meetings of clubs or organizations: Not on file    Relationship status: Not on file  . Intimate partner violence:    Fear of current or ex partner: Not on file    Emotionally abused: Not on file    Physically abused: Not on file    Forced sexual activity: Not on file  Other Topics Concern  . Not on file  Social History Narrative  . Not on file     Observations/Objective: Vitals:   03/22/19 0818  Weight: 255 lb (115.7 kg)  Height: 6' 1" (1.854 m)  no distress on phone. Appropriate responses.   Assessment and Plan: Type 2 diabetes mellitus with hyperglycemia, without long-term current use of insulin (HCC) Improving control, still elevated A1c based on reading today.  Persistent elevated home readings.  Increase Lantus to 20 units/day, metformin to 850 mg twice daily, potential side effects discussed, and cautioned on hypoglycemic symptoms with increasing doses.  RTC precautions.  Recheck 3 months.  Repeat lipids in 3 months and if still mild elevation, consider adding  statin at that time.  Follow Up Instructions: 3 months.    Patient Instructions  Increase lantus to 20 units per day, and increase metformin dose to 850 mg twice per day.  Watch for low blood sugar symptoms at these higher doses of medicines and follow-up in 3 months.  See precautions below on hypo-glycemia or low blood sugars.  If those occur please let me know.    Hypoglycemia Hypoglycemia occurs when the level of sugar (glucose) in the blood is too low. Hypoglycemia can happen in people who do or do not have diabetes. It can develop quickly, and it can be a medical emergency. For most  people with diabetes, a blood glucose level below 70 mg/dL (3.9 mmol/L) is considered hypoglycemia. Glucose is a type of sugar that provides the body's main source of energy. Certain hormones (insulin and glucagon) control the level of glucose in the blood. Insulin lowers blood glucose, and glucagon raises blood glucose. Hypoglycemia can result from having too much insulin in the bloodstream, or from not eating enough food that contains glucose. You may also have reactive hypoglycemia, which happens within 4 hours after eating a meal. What are the causes? Hypoglycemia occurs most often in people who have diabetes and may be caused by:  Diabetes medicine.  Not eating enough, or not eating often enough.  Increased physical activity.  Drinking alcohol on an empty stomach. If you do not have diabetes, hypoglycemia may be caused by:  A tumor in the pancreas.  Not eating enough, or not eating for long periods at a time (fasting).  A severe infection or illness.  Certain medicines. What increases the risk? Hypoglycemia is more likely to develop in:  People who have diabetes and take medicines to lower blood glucose.  People who abuse alcohol.  People who have a severe illness. What are the signs or symptoms? Mild symptoms Mild hypoglycemia may not cause any symptoms. If you do have symptoms, they  may include:  Hunger.  Anxiety.  Sweating and feeling clammy.  Dizziness or feeling light-headed.  Sleepiness.  Nausea.  Increased heart rate.  Headache.  Blurry vision.  Irritability.  Tingling or numbness around the mouth, lips, or tongue.  A change in coordination.  Restless sleep. Moderate symptoms Moderate hypoglycemia can cause:  Mental confusion and poor judgment.  Behavior changes.  Weakness.  Irregular heartbeat. Severe symptoms Severe hypoglycemia is a medical emergency. It can cause:  Fainting.  Seizures.  Loss of consciousness (coma).  Death. How is this diagnosed? Hypoglycemia is diagnosed with a blood test to measure your blood glucose level. This blood test is done while you are having symptoms. Your health care provider may also do a physical exam and review your medical history. How is this treated? This condition can often be treated by immediately eating or drinking something that contains sugar, such as:  Fruit juice, 4-6 oz (120-150 mL).  Regular soda (not diet soda), 4-6 oz (120-150 mL).  Low-fat milk, 4 oz (120 mL).  Several pieces of hard candy.  Sugar or honey, 1 Tbsp (15 mL). Treating hypoglycemia if you have diabetes If you are alert and able to swallow safely, follow the 15:15 rule:  Take 15 grams of a rapid-acting carbohydrate. Talk with your health care provider about how much you should take.  Rapid-acting options include: ? Glucose pills (take 15 grams). ? 6-8 pieces of hard candy. ? 4-6 oz (120-150 mL) of fruit juice. ? 4-6 oz (120-150 mL) of regular (not diet) soda. ? 1 Tbsp (15 mL) honey or sugar.  Check your blood glucose 15 minutes after you take the carbohydrate.  If the repeat blood glucose level is still at or below 70 mg/dL (3.9 mmol/L), take 15 grams of a carbohydrate again.  If your blood glucose level does not increase above 70 mg/dL (3.9 mmol/L) after 3 tries, seek emergency medical care.   After your blood glucose level returns to normal, eat a meal or a snack within 1 hour.  Treating severe hypoglycemia Severe hypoglycemia is when your blood glucose level is at or below 54 mg/dL (3 mmol/L). Severe hypoglycemia is a medical emergency. Get medical  help right away. If you have severe hypoglycemia and you cannot eat or drink, you may need an injection of glucagon. A family member or close friend should learn how to check your blood glucose and how to give you a glucagon injection. Ask your health care provider if you need to have an emergency glucagon injection kit available. Severe hypoglycemia may need to be treated in a hospital. The treatment may include getting glucose through an IV. You may also need treatment for the cause of your hypoglycemia. Follow these instructions at home:  General instructions  Take over-the-counter and prescription medicines only as told by your health care provider.  Monitor your blood glucose as told by your health care provider.  Limit alcohol intake to no more than 1 drink a day for nonpregnant women and 2 drinks a day for men. One drink equals 12 oz of beer (355 mL), 5 oz of wine (148 mL), or 1 oz of hard liquor (44 mL).  Keep all follow-up visits as told by your health care provider. This is important. If you have diabetes:  Always have a rapid-acting carbohydrate snack with you to treat low blood glucose.  Follow your diabetes management plan as directed. Make sure you: ? Know the symptoms of hypoglycemia. It is important to treat it right away to prevent it from becoming severe. ? Take your medicines as directed. ? Follow your exercise plan. ? Follow your meal plan. Eat on time, and do not skip meals. ? Check your blood glucose as often as directed. Always check before and after exercise. ? Follow your sick day plan whenever you cannot eat or drink normally. Make this plan in advance with your health care provider.  Share your diabetes  management plan with people in your workplace, school, and household.  Check your urine for ketones when you are ill and as told by your health care provider.  Carry a medical alert card or wear medical alert jewelry. Contact a health care provider if:  You have problems keeping your blood glucose in your target range.  You have frequent episodes of hypoglycemia. Get help right away if:  You continue to have hypoglycemia symptoms after eating or drinking something containing glucose.  Your blood glucose is at or below 54 mg/dL (3 mmol/L).  You have a seizure.  You faint. These symptoms may represent a serious problem that is an emergency. Do not wait to see if the symptoms will go away. Get medical help right away. Call your local emergency services (911 in the [place] Summary  Hypoglycemia occurs when the level of sugar (glucose) in the blood is too low.  Hypoglycemia can happen in people who do or do not have diabetes. It can develop quickly, and it can be a medical emergency.  Make sure you know the symptoms of hypoglycemia and how to treat it.  Always have a rapid-acting carbohydrate snack with you to treat low blood sugar. This information is not intended to replace advice given to you by your health care provider. Make sure you discuss any questions you have with your health care provider. Document Released: 10/20/2005 Document Revised: 04/13/2018 Document Reviewed: 11/23/2015 Elsevier Interactive Patient Education  2019 [employer].      I discussed the assessment and treatment plan with the patient. The patient was provided an opportunity to ask questions and all were answered. The patient agreed with the plan and demonstrated an understanding of the instructions.   The patient was advised to  call back or seek an in-person evaluation if the symptoms worsen or if the condition fails to improve as anticipated.  I provided 9 minutes of non-face-to-face time during this  encounter.  Signed,   [name], MD [facility].  03/22/19

## 2019-03-23 LAB — MICROALBUMIN / CREATININE URINE RATIO
Creatinine, Urine: 135 mg/dL
Microalb/Creat Ratio: 3 mg/g creat (ref 0–29)
Microalbumin, Urine: 3.7 ug/mL

## 2019-04-15 ENCOUNTER — Telehealth: Payer: Self-pay | Admitting: Family Medicine

## 2019-04-15 NOTE — Telephone Encounter (Signed)
Copied from [place] [id number]. Topic: Quick Communication - Rx Refill/Question >> Apr 15, 2019  1:18 PM Kaycee, [place] D wrote: Medication: metFORMIN (GLUCOPHAGE) 850 MG tablet / lisinopril (PRINIVIL,ZESTRIL) 5 MG / tablet/ glucose blood (ACCU-CHEK AVIVA) test strip  Has the patient contacted their pharmacy? Yes.   (Agent: If no, request that the patient contact the pharmacy for the refill.) (Agent: If yes, when and what did the pharmacy advise?)  Preferred Pharmacy (with phone number or street name): [place], [place] - [address] [phone] (Phone) [phone] (Fax)    Agent: Please be advised that RX refills may take up to 3 business days. We ask that you follow-up with your pharmacy.

## 2019-04-21 ENCOUNTER — Other Ambulatory Visit: Payer: Self-pay

## 2019-04-21 DIAGNOSIS — E1165 Type 2 diabetes mellitus with hyperglycemia: Secondary | ICD-10-CM

## 2019-04-21 DIAGNOSIS — I10 Essential (primary) hypertension: Secondary | ICD-10-CM

## 2019-04-21 MED ORDER — ACCU-CHEK AVIVA VI STRP: ORAL_STRIP | 12 refills | Status: DC

## 2019-04-21 MED ORDER — LISINOPRIL 5 MG PO TABS: 5.0000 mg | ORAL_TABLET | Freq: Every day | ORAL | 0 refills | Status: DC

## 2019-04-21 NOTE — Telephone Encounter (Signed)
Rx sent to pharmacy   

## 2019-05-18 ENCOUNTER — Encounter: Payer: Self-pay | Admitting: Podiatry

## 2019-05-18 ENCOUNTER — Ambulatory Visit (INDEPENDENT_AMBULATORY_CARE_PROVIDER_SITE_OTHER): Payer: Medicare Other | Admitting: Podiatry

## 2019-05-18 ENCOUNTER — Other Ambulatory Visit: Payer: Self-pay

## 2019-05-18 VITALS — Temp 97.3°F

## 2019-05-18 DIAGNOSIS — M79675 Pain in left toe(s): Secondary | ICD-10-CM

## 2019-05-18 DIAGNOSIS — B351 Tinea unguium: Secondary | ICD-10-CM | POA: Diagnosis not present

## 2019-05-18 DIAGNOSIS — M79674 Pain in right toe(s): Secondary | ICD-10-CM

## 2019-05-18 NOTE — Patient Instructions (Signed)
Diabetes Mellitus and Foot Care Foot care is an important part of your health, especially when you have diabetes. Diabetes may cause you to have problems because of poor blood flow (circulation) to your feet and legs, which can cause your skin to:  Become thinner and drier.  Break more easily.  Heal more slowly.  Peel and crack. You may also have nerve damage (neuropathy) in your legs and feet, causing decreased feeling in them. This means that you may not notice minor injuries to your feet that could lead to more serious problems. Noticing and addressing any potential problems early is the best way to prevent future foot problems. How to care for your feet Foot hygiene  Wash your feet daily with warm water and mild soap. Do not use hot water. Then, pat your feet and the areas between your toes until they are completely dry. Do not soak your feet as this can dry your skin.  Trim your toenails straight across. Do not dig under them or around the cuticle. File the edges of your nails with an emery board or nail file.  Apply a moisturizing lotion or petroleum jelly to the skin on your feet and to dry, brittle toenails. Use lotion that does not contain alcohol and is unscented. Do not apply lotion between your toes. Shoes and socks  Wear clean socks or stockings every day. Make sure they are not too tight. Do not wear knee-high stockings since they may decrease blood flow to your legs.  Wear shoes that fit properly and have enough cushioning. Always look in your shoes before you put them on to be sure there are no objects inside.  To break in new shoes, wear them for just a few hours a day. This prevents injuries on your feet. Wounds, scrapes, corns, and calluses  Check your feet daily for blisters, cuts, bruises, sores, and redness. If you cannot see the bottom of your feet, use a mirror or ask someone for help.  Do not cut corns or calluses or try to remove them with medicine.  If you  find a minor scrape, cut, or break in the skin on your feet, keep it and the skin around it clean and dry. You may clean these areas with mild soap and water. Do not clean the area with peroxide, alcohol, or iodine.  If you have a wound, scrape, corn, or callus on your foot, look at it several times a day to make sure it is healing and not infected. Check for: ? Redness, swelling, or pain. ? Fluid or blood. ? Warmth. ? Pus or a bad smell. General instructions  Do not cross your legs. This may decrease blood flow to your feet.  Do not use heating pads or hot water bottles on your feet. They may burn your skin. If you have lost feeling in your feet or legs, you may not know this is happening until it is too late.  Protect your feet from hot and cold by wearing shoes, such as at the beach or on hot pavement.  Schedule a complete foot exam at least once a year (annually) or more often if you have foot problems. If you have foot problems, report any cuts, sores, or bruises to your health care provider immediately. Contact a health care provider if:  You have a medical condition that increases your risk of infection and you have any cuts, sores, or bruises on your feet.  You have an injury that is not   healing.  You have redness on your legs or feet.  You feel burning or tingling in your legs or feet.  You have pain or cramps in your legs and feet.  Your legs or feet are numb.  Your feet always feel cold.  You have pain around a toenail. Get help right away if:  You have a wound, scrape, corn, or callus on your foot and: ? You have pain, swelling, or redness that gets worse. ? You have fluid or blood coming from the wound, scrape, corn, or callus. ? Your wound, scrape, corn, or callus feels warm to the touch. ? You have pus or a bad smell coming from the wound, scrape, corn, or callus. ? You have a fever. ? You have a red line going up your leg. Summary  Check your feet every day  for cuts, sores, red spots, swelling, and blisters.  Moisturize feet and legs daily.  Wear shoes that fit properly and have enough cushioning.  If you have foot problems, report any cuts, sores, or bruises to your health care provider immediately.  Schedule a complete foot exam at least once a year (annually) or more often if you have foot problems. This information is not intended to replace advice given to you by your health care provider. Make sure you discuss any questions you have with your health care provider. Document Released: 10/17/2000 Document Revised: 12/02/2017 Document Reviewed: 11/21/2016 Elsevier Patient Education  2020 [employer]   Onychomycosis/Fungal Toenails  WHAT IS IT? An infection that lies within the keratin of your nail plate that is caused by a fungus.  WHY ME? Fungal infections affect all ages, sexes, races, and creeds.  There may be many factors that predispose you to a fungal infection such as age, coexisting medical conditions such as diabetes, or an autoimmune disease; stress, medications, fatigue, genetics, etc.  Bottom line: fungus thrives in a warm, moist environment and your shoes offer such a location.  IS IT CONTAGIOUS? Theoretically, yes.  You do not want to share shoes, nail clippers or files with someone who has fungal toenails.  Walking around barefoot in the same room or sleeping in the same bed is unlikely to transfer the organism.  It is important to realize, however, that fungus can spread easily from one nail to the next on the same foot.  HOW DO WE TREAT THIS?  There are several ways to treat this condition.  Treatment may depend on many factors such as age, medications, pregnancy, liver and kidney conditions, etc.  It is best to ask your doctor which options are available to you.  1. No treatment.   Unlike many other medical concerns, you can live with this condition.  However for many people this can be a painful condition and may lead to  ingrown toenails or a bacterial infection.  It is recommended that you keep the nails cut short to help reduce the amount of fungal nail. 2. Topical treatment.  These range from herbal remedies to prescription strength nail lacquers.  About 40-50% effective, topicals require twice daily application for approximately 9 to 12 months or until an entirely new nail has grown out.  The most effective topicals are medical grade medications available through physicians offices. 3. Oral antifungal medications.  With an 80-90% cure rate, the most common oral medication requires 3 to 4 months of therapy and stays in your system for a year as the new nail grows out.  Oral antifungal medications do require   blood work to make sure it is a safe drug for you.  A liver function panel will be performed prior to starting the medication and after the first month of treatment.  It is important to have the blood work performed to avoid any harmful side effects.  In general, this medication safe but blood work is required. 4. Laser Therapy.  This treatment is performed by applying a specialized laser to the affected nail plate.  This therapy is noninvasive, fast, and non-painful.  It is not covered by insurance and is therefore, out of pocket.  The results have been very good with a 80-95% cure rate.  [facility] is the only practice in the area to offer this therapy. 5. Permanent Nail Avulsion.  Removing the entire nail so that a new nail will not grow back. 

## 2019-05-22 NOTE — Progress Notes (Signed)
Subjective:  Edward Briggs presents to clinic today with cc of  painful, thick, discolored, elongated toenails 1-5 b/l that become tender and cannot cut because of thickness. Pain is aggravated when wearing enclosed shoe gear.   Current Outpatient Medications:  .  Ascorbic Acid (VITAMIN C PO), Take by mouth., Disp: , Rfl:  .  aspirin 81 MG tablet, Take 81 mg by mouth daily., Disp: , Rfl:  .  Blood Glucose Monitoring Suppl (BLOOD GLUCOSE METER) kit, Use as instructed, Disp: 1 each, Rfl: 0 .  fluconazole (DIFLUCAN) 150 MG tablet, TAKE 1 TABLET BY MOUTH AS ONE DOSE, Disp: , Rfl:  .  glucose blood (ACCU-CHEK AVIVA) test strip, Use as instructed-  2-3 time a day, Disp: 100 each, Rfl: 12 .  hydrocortisone 1 % lotion, Apply 1 application topically 2 (two) times daily., Disp: 118 mL, Rfl: 0 .  Insulin Glargine (LANTUS SOLOSTAR) 100 UNIT/ML Solostar Pen, Inject 20 Units into the skin daily., Disp: 5 pen, Rfl: 1 .  Insulin Pen Needle (PEN NEEDLES) 32G X 4 MM MISC, 1 application by Does not apply route daily. BD nano ultrafine, Disp: 100 each, Rfl: 2 .  lisinopril (ZESTRIL) 5 MG tablet, Take 1 tablet (5 mg total) by mouth daily., Disp: 90 tablet, Rfl: 0 .  metFORMIN (GLUCOPHAGE) 850 MG tablet, Take 1 tablet (850 mg total) by mouth 2 (two) times daily with a meal., Disp: 180 tablet, Rfl: 1   No Known Allergies   Objective: Vitals:   05/18/19 1030  Temp: (!) 97.3 F (36.3 C)    Physical Examination:  Vascular Examination: Capillary refill time immediate x 10 digits.  Palpable DP 2/4 b/l.   PT pulses 1/4 b/l.  Digital hair decreased b/l  No edema noted b/l.  Skin temperature gradient WNL b/l.  Dermatological Examination: Skin with normal turgor, texture and tone b/l.  No open wounds b/l.  No interdigital macerations noted b/l.  Elongated, thick, discolored brittle toenails with subungual debris and pain on dorsal palpation of nailbeds 1-5 b/l.  Musculoskeletal  Examination: Muscle strength 5/5 to all muscle groups b/l.  No pain, crepitus or joint discomfort with active/passive ROM.  Neurological Examination: Sensation intact 5/5 b/l with 10 gram monofilament.  Vibratory sensation decreased b/l.  Assessment: Mycotic nail infection with pain 1-5 b/l  Plan: 1. Toenails 1-5 b/l were debrided in length and girth without iatrogenic laceration. 2.  Continue soft, supportive shoe gear daily. 3.  Report any pedal injuries to medical professional. 4.  Follow up 3 months. 5.  Patient/POA to call should there be a question/concern in there interim.

## 2019-06-09 ENCOUNTER — Other Ambulatory Visit: Payer: Self-pay | Admitting: Family Medicine

## 2019-06-09 DIAGNOSIS — E1165 Type 2 diabetes mellitus with hyperglycemia: Secondary | ICD-10-CM

## 2019-06-09 NOTE — Telephone Encounter (Signed)
Forwarding medication refill request to clinical pool for review. 

## 2019-06-10 NOTE — Telephone Encounter (Signed)
Refilled medication,  0 refills. Pateint

## 2019-07-22 ENCOUNTER — Ambulatory Visit (INDEPENDENT_AMBULATORY_CARE_PROVIDER_SITE_OTHER)
Admission: EM | Admit: 2019-07-22 | Discharge: 2019-07-22 | Disposition: A | Discharge status: Home / Self Care | Payer: Medicare Other | Source: Home / Self Care

## 2019-07-22 ENCOUNTER — Inpatient Hospital Stay (HOSPITAL_COMMUNITY)
Admission: EM | Admit: 2019-07-22 | Discharge: 2019-07-25 | DRG: 871 | Disposition: A | Discharge status: Home Health | Payer: Medicare Other | Attending: Internal Medicine | Admitting: Internal Medicine

## 2019-07-22 ENCOUNTER — Other Ambulatory Visit: Payer: Self-pay

## 2019-07-22 ENCOUNTER — Emergency Department (HOSPITAL_COMMUNITY): Payer: Medicare Other

## 2019-07-22 DIAGNOSIS — Z79899 Other long term (current) drug therapy: Secondary | ICD-10-CM | POA: Diagnosis not present

## 2019-07-22 DIAGNOSIS — A419 Sepsis, unspecified organism: Secondary | ICD-10-CM | POA: Diagnosis not present

## 2019-07-22 DIAGNOSIS — G92 Toxic encephalopathy: Secondary | ICD-10-CM | POA: Diagnosis present

## 2019-07-22 DIAGNOSIS — K219 Gastro-esophageal reflux disease without esophagitis: Secondary | ICD-10-CM | POA: Diagnosis present

## 2019-07-22 DIAGNOSIS — Z20828 Contact with and (suspected) exposure to other viral communicable diseases: Secondary | ICD-10-CM | POA: Diagnosis present

## 2019-07-22 DIAGNOSIS — N3000 Acute cystitis without hematuria: Secondary | ICD-10-CM | POA: Diagnosis not present

## 2019-07-22 DIAGNOSIS — Z7982 Long term (current) use of aspirin: Secondary | ICD-10-CM | POA: Diagnosis not present

## 2019-07-22 DIAGNOSIS — E1165 Type 2 diabetes mellitus with hyperglycemia: Secondary | ICD-10-CM | POA: Diagnosis present

## 2019-07-22 DIAGNOSIS — R270 Ataxia, unspecified: Secondary | ICD-10-CM | POA: Diagnosis not present

## 2019-07-22 DIAGNOSIS — R404 Transient alteration of awareness: Secondary | ICD-10-CM

## 2019-07-22 DIAGNOSIS — M1990 Unspecified osteoarthritis, unspecified site: Secondary | ICD-10-CM | POA: Diagnosis present

## 2019-07-22 DIAGNOSIS — R509 Fever, unspecified: Secondary | ICD-10-CM

## 2019-07-22 DIAGNOSIS — R739 Hyperglycemia, unspecified: Secondary | ICD-10-CM

## 2019-07-22 DIAGNOSIS — R4182 Altered mental status, unspecified: Secondary | ICD-10-CM | POA: Diagnosis not present

## 2019-07-22 DIAGNOSIS — G9341 Metabolic encephalopathy: Secondary | ICD-10-CM | POA: Diagnosis not present

## 2019-07-22 DIAGNOSIS — I10 Essential (primary) hypertension: Secondary | ICD-10-CM | POA: Diagnosis present

## 2019-07-22 DIAGNOSIS — Z87891 Personal history of nicotine dependence: Secondary | ICD-10-CM

## 2019-07-22 DIAGNOSIS — N400 Enlarged prostate without lower urinary tract symptoms: Secondary | ICD-10-CM | POA: Diagnosis present

## 2019-07-22 DIAGNOSIS — E119 Type 2 diabetes mellitus without complications: Secondary | ICD-10-CM | POA: Diagnosis not present

## 2019-07-22 DIAGNOSIS — Z794 Long term (current) use of insulin: Secondary | ICD-10-CM

## 2019-07-22 DIAGNOSIS — N390 Urinary tract infection, site not specified: Secondary | ICD-10-CM | POA: Diagnosis present

## 2019-07-22 DIAGNOSIS — R918 Other nonspecific abnormal finding of lung field: Secondary | ICD-10-CM | POA: Diagnosis not present

## 2019-07-22 DIAGNOSIS — N179 Acute kidney failure, unspecified: Secondary | ICD-10-CM | POA: Diagnosis present

## 2019-07-22 LAB — COMPREHENSIVE METABOLIC PANEL
ALT: 17 U/L (ref 0–44)
AST: 16 U/L (ref 15–41)
Albumin: 3.6 g/dL (ref 3.5–5.0)
Alkaline Phosphatase: 53 U/L (ref 38–126)
Anion gap: 12 (ref 5–15)
BUN: 15 mg/dL (ref 8–23)
CO2: 22 mmol/L (ref 22–32)
Calcium: 8.9 mg/dL (ref 8.9–10.3)
Chloride: 97 mmol/L — ABNORMAL LOW (ref 98–111)
Creatinine, Ser: 1.53 mg/dL — ABNORMAL HIGH (ref 0.61–1.24)
GFR calc Af Amer: 54 mL/min — ABNORMAL LOW (ref >60)
GFR calc non Af Amer: 46 mL/min — ABNORMAL LOW (ref >60)
Glucose, Bld: 262 mg/dL — ABNORMAL HIGH (ref 70–99)
Potassium: 4.3 mmol/L (ref 3.5–5.1)
Sodium: 131 mmol/L — ABNORMAL LOW (ref 135–145)
Total Bilirubin: 1.2 mg/dL (ref 0.3–1.2)
Total Protein: 7.7 g/dL (ref 6.5–8.1)

## 2019-07-22 LAB — CBG MONITORING, ED: Glucose-Capillary: 252 mg/dL — ABNORMAL HIGH (ref 70–99)

## 2019-07-22 LAB — URINALYSIS, ROUTINE W REFLEX MICROSCOPIC
Bilirubin Urine: NEGATIVE
Glucose, UA: NEGATIVE mg/dL
Ketones, ur: NEGATIVE mg/dL
Nitrite: NEGATIVE
Protein, ur: NEGATIVE mg/dL
Specific Gravity, Urine: 1.009 (ref 1.005–1.030)
WBC, UA: >50 WBC/hpf — ABNORMAL HIGH (ref 0–5)
pH: 5 (ref 5.0–8.0)

## 2019-07-22 LAB — CBC
HCT: 40.9 % (ref 39.0–52.0)
Hemoglobin: 13.7 g/dL (ref 13.0–17.0)
MCH: 31.8 pg (ref 26.0–34.0)
MCHC: 33.5 g/dL (ref 30.0–36.0)
MCV: 94.9 fL (ref 80.0–100.0)
Platelets: 206 10*3/uL (ref 150–400)
RBC: 4.31 MIL/uL (ref 4.22–5.81)
RDW: 11.9 % (ref 11.5–15.5)
WBC: 17.5 10*3/uL — ABNORMAL HIGH (ref 4.0–10.5)
nRBC: 0 % (ref 0.0–0.2)

## 2019-07-22 LAB — LACTIC ACID, PLASMA: Lactic Acid, Venous: 1.9 mmol/L (ref 0.5–1.9)

## 2019-07-22 LAB — POCT FASTING CBG KUC MANUAL ENTRY: POCT Glucose (KUC): 264 mg/dL — AB (ref 70–99)

## 2019-07-22 MED ORDER — SODIUM CHLORIDE 0.9 % IV BOLUS: 1000.0000 mL | Freq: Once | INTRAVENOUS | Status: DC

## 2019-07-22 MED ORDER — SODIUM CHLORIDE 0.9 % IV SOLN
500.0000 mg | Freq: Once | INTRAVENOUS | Status: AC
  Administered 2019-07-22: 500 mg via INTRAVENOUS
  Filled 2019-07-22: qty 500

## 2019-07-22 MED ORDER — SODIUM CHLORIDE 0.9 % IV BOLUS
1500.0000 mL | Freq: Once | INTRAVENOUS | Status: AC
  Administered 2019-07-22: 23:00:00 1500 mL via INTRAVENOUS

## 2019-07-22 MED ORDER — SODIUM CHLORIDE 0.9 % IV SOLN
1.0000 g | Freq: Once | INTRAVENOUS | Status: AC
  Administered 2019-07-22: 1 g via INTRAVENOUS
  Filled 2019-07-22: qty 10

## 2019-07-22 MED ORDER — SODIUM CHLORIDE 0.9 % IV BOLUS
500.0000 mL | Freq: Once | INTRAVENOUS | Status: AC
  Administered 2019-07-22: 19:00:00 500 mL via INTRAVENOUS

## 2019-07-22 MED ORDER — SODIUM CHLORIDE 0.9 % IV SOLN
Freq: Once | INTRAVENOUS | Status: AC
  Administered 2019-07-22: 21:00:00 via INTRAVENOUS

## 2019-07-22 MED ORDER — ACETAMINOPHEN 500 MG PO TABS
1000.0000 mg | ORAL_TABLET | Freq: Once | ORAL | Status: AC
  Administered 2019-07-22: 1000 mg via ORAL
  Filled 2019-07-22: qty 2

## 2019-07-22 NOTE — ED Provider Notes (Signed)
°[facility] EMERGENCY DEPARTMENT °Provider Note ° ° °CSN: [id number] °Arrival date & time: 07/22/19  1205 ° °  ° °History   °Chief Complaint °Chief Complaint  °Patient presents with  °• Altered Mental Status  ° ° °HPI °Matty C Clonch is a 68 y.o. male. ° °  ° °68-year-old male with prior medical history as detailed below presents for evaluation of weakness, fatigue, fever, and unsteady gait.  Symptoms began approximately 2 to 3 days previously.  Patient was seen earlier today at urgent care and sent to the ED for further evaluation.  Patient was noted to be febrile there with a temperature of 100.4.  Patient without known exposure to COVID positive patient.  Patient's spouse provides majority of history.  She reports the patient has been urinating more frequently.  No reported cough, congestion, shortness of breath, bowel movement change, or pain. ° °The history is provided by the patient, the spouse and medical records.  °Altered Mental Status °Presenting symptoms: disorientation and lethargy   °Severity:  Mild °Most recent episode:  2 days ago °Episode history:  Single °Timing:  Constant °Progression:  Waxing and waning °Chronicity:  New ° ° °Past Medical History:  °Diagnosis Date  °• Arthritis   °• Diabetes mellitus without complication (HCC)   °• GERD (gastroesophageal reflux disease)   ° no meds  ° ° °Patient Active Problem List  ° Diagnosis Date Noted  °• Type II or unspecified type diabetes mellitus without mention of complication, uncontrolled 09/10/2012  °• GASTROESOPHAGEAL REFLUX DISEASE 07/21/2009  °• CHEST PAIN 07/21/2009  ° ° °Past Surgical History:  °Procedure Laterality Date  °• ABSCESS DRAINAGE    °• FRACTURE SURGERY    °• HARDWARE REMOVAL  03/26/2012  ° Procedure: HARDWARE REMOVAL;  Surgeon: Paul A Bednarz, MD;  Location: [facility];  Service: Orthopedics;  Laterality: Left;  hardware removal deep left ankle syndesmotic screws only and stress xrays ankle °  °• ORIF  ANKLE FRACTURE  12/03/2011  ° Procedure: OPEN REDUCTION INTERNAL FIXATION (ORIF) ANKLE FRACTURE;  Surgeon: Paul A Bednarz, MD;  Location: [facility];  Service: Orthopedics;  Laterality: Left;  left medial malleolus fracture  °• PILONIDAL CYST EXCISION  ~1982  °• SKIN GRAFT  ~1970  ° Lt great toe (graft from Lt thigh)  °  ° ° ° ° °Home Medications   ° °Prior to Admission medications   °Medication Sig Start Date End Date Taking? Authorizing Provider  °Ascorbic Acid (VITAMIN C PO) Take by mouth.    [provider]  °aspirin 81 MG tablet Take 81 mg by mouth daily.    [provider]  °Blood Glucose Monitoring Suppl (BLOOD GLUCOSE METER) kit Use as instructed 09/10/12   Hopper, David H, MD  °fluconazole (DIFLUCAN) 150 MG tablet TAKE 1 TABLET BY MOUTH AS ONE DOSE 12/07/18   [provider]  °glucose blood (ACCU-CHEK AVIVA) test strip Use as instructed-  2-3 time a day 04/21/19   Greene, Jeffrey R, MD  °hydrocortisone 1 % lotion Apply 1 application topically 2 (two) times daily. 12/07/18   Greene, Jeffrey R, MD  °Insulin Pen Needle (PEN NEEDLES) 32G X 4 MM MISC 1 application by Does not apply route daily. BD nano ultrafine 03/22/19   Greene, Jeffrey R, MD  °LANTUS SOLOSTAR 100 UNIT/ML Solostar Pen INJECT 10 UNITS INTO THE SKIN DAILY. 06/10/19   Greene, Jeffrey R, MD  °lisinopril (ZESTRIL) 5 MG tablet Take 1 tablet (5 mg total)   total) by mouth daily. 04/21/19   [name], MD  metFORMIN (GLUCOPHAGE) 850 MG tablet Take 1 tablet (850 mg total) by mouth 2 (two) times daily with a meal. 03/22/19   [name], MD    Family History Family History  Problem Relation Age of Onset   Cancer Mother    Cancer Father     Social History Social History   Tobacco Use   Smoking status: Former Smoker    Quit date: 10/30/1988    Years since quitting: 30.7   Smokeless tobacco: Never Used  Substance Use Topics   Alcohol use: Yes    Alcohol/week: 1.0 standard drinks    Types: 1 Shots  of liquor per week    Comment: every two weeks   Drug use: No     Allergies   Patient has no known allergies.   Review of Systems Review of Systems  All other systems reviewed and are negative.    Physical Exam Updated Vital Signs BP 115/63 (BP Location: Right Arm)    Pulse 93    Temp 98.7 F (37.1 C) (Oral)    Resp (!) 27    SpO2 100%   Physical Exam Vitals signs and nursing note reviewed.  Constitutional:      General: He is not in acute distress.    Appearance: Normal appearance. He is well-developed.  HENT:     Head: Normocephalic and atraumatic.  Eyes:     Conjunctiva/sclera: Conjunctivae normal.     Pupils: Pupils are equal, round, and reactive to light.  Neck:     Musculoskeletal: Normal range of motion and neck supple.  Cardiovascular:     Rate and Rhythm: Normal rate and regular rhythm.     Heart sounds: Normal heart sounds.  Pulmonary:     Effort: Pulmonary effort is normal. No respiratory distress.     Breath sounds: Normal breath sounds.  Abdominal:     General: There is no distension.     Palpations: Abdomen is soft.     Tenderness: There is no abdominal tenderness.  Musculoskeletal: Normal range of motion.        General: No deformity.  Skin:    General: Skin is warm and dry.  Neurological:     General: No focal deficit present.     Mental Status: He is alert.     Comments: No focal deficit  Normal speech  No facial droop        ED Treatments / Results  Labs (all labs ordered are listed, but only abnormal results are displayed) Labs Reviewed  COMPREHENSIVE METABOLIC PANEL - Abnormal; Notable for the following components:      Result Value   Sodium 131 (*)    Chloride 97 (*)    Glucose, Bld 262 (*)    Creatinine, Ser 1.53 (*)    GFR calc non Af Amer 46 (*)    GFR calc Af Amer 54 (*)    All other components within normal limits  CBC - Abnormal; Notable for the following components:   WBC 17.5 (*)    All other components within  normal limits  CBG MONITORING, ED - Abnormal; Notable for the following components:   Glucose-Capillary 252 (*)    All other components within normal limits  SARS CORONAVIRUS 2 (TAT 6-24 HRS)  SARS CORONAVIRUS 2 (HOSPITAL ORDER, [facility])  LACTIC ACID, PLASMA  URINALYSIS, ROUTINE W REFLEX MICROSCOPIC  CBG MONITORING, ED  EKG None  Radiology Ct Head Wo Contrast  Result Date: 07/22/2019 CLINICAL DATA:  Altered mental status EXAM: CT HEAD WITHOUT CONTRAST TECHNIQUE: Contiguous axial images were obtained from the base of the skull through the vertex without intravenous contrast. COMPARISON:  None. FINDINGS: Brain: No evidence of acute infarction, hemorrhage, hydrocephalus, extra-axial collection or mass lesion/mass effect. Periventricular white matter hypodensity. Vascular: No hyperdense vessel or unexpected calcification. Skull: Normal. Negative for fracture or focal lesion. Sinuses/Orbits: Mucoid retention cysts and frothy secretions present in the maxillary sinuses. Mild mucosal thickening. Other: None. IMPRESSION: 1. No acute intracranial pathology. Small-vessel white matter disease. 2. Mucoid retention cysts and frothy secretions present in the maxillary sinuses. Mild mucosal thickening. Correlate for signs and symptoms of sinusitis. Electronically Signed   By: Eddie Candle M.D.   On: 07/22/2019 20:24   Dg Chest Port 1 View  Result Date: 07/22/2019 CLINICAL DATA:  Fever EXAM: PORTABLE CHEST 1 VIEW COMPARISON:  None. FINDINGS: The heart size and mediastinal contours are within normal limits. Subtle bibasilar heterogeneous airspace opacity. The visualized skeletal structures are unremarkable. IMPRESSION: Subtle bibasilar heterogeneous airspace opacity, concerning for infection or aspiration. There may be chronic underlying scarring or interstitial change. Electronically Signed   By: Eddie Candle M.D.   On: 07/22/2019 18:08    Procedures Procedures (including  critical care time)  Medications Ordered in ED Medications  acetaminophen (TYLENOL) tablet 1,000 mg (has no administration in time range)  sodium chloride 0.9 % bolus 500 mL (has no administration in time range)     Initial Impression / Assessment and Plan / ED Course  I have reviewed the triage vital signs and the nursing notes.  Pertinent labs & imaging results that were available during my care of the patient were reviewed by me and considered in my medical decision making (see chart for details).  Clinical Course as of Jul 21 2142  Fri Jul 22, 2019  2048 Lactic Acid, Venous: 1.9 [PM]    Clinical Course User Index [PM] Valarie Merino, MD       MDM  Screen complete  WESTON KALLMAN was evaluated in Emergency Department on 07/22/2019 for the symptoms described in the history of present illness. He was evaluated in the context of the global COVID-19 pandemic, which necessitated consideration that the patient might be at risk for infection with the SARS-CoV-2 virus that causes COVID-19. Institutional protocols and algorithms that pertain to the evaluation of patients at risk for COVID-19 are in a state of rapid change based on information released by regulatory bodies including the CDC and federal and state organizations. These policies and algorithms were followed during the patient's care in the ED.  Patient is presenting for evaluation of fever and confusion. Symptoms are suggestive of possible infection.   Screening tests suggest possible pneumonia vs UTI. Covid (2-24 TAT) pending at time of admission discussion with Dr. Blaine Hamper. Dr. Blaine Hamper requests rapid COVID test.  Patient given abx to cover possible UTI vs CAP. He would benefit from admission. Dr Blaine Hamper aware of case and will evaluate for same.    Final Clinical Impressions(s) / ED Diagnoses   Final diagnoses:  Fever, unspecified fever cause    ED Discharge Orders    None       Valarie Merino, MD 07/22/19 2146

## 2019-07-22 NOTE — ED Triage Notes (Signed)
pts wife states pts sugar was over 300 last night, he has been going to the bathroom a lot and drinking more water. States he has been dropping things, confused and sleeping a lot since yesterday

## 2019-07-22 NOTE — ED Triage Notes (Signed)
Pt to ER for evaluation of altered mental status and unsteady gait onset Wednesday. Wife is not present to obtain more information. CBG in triage 252. Patient is calm and cooperative. Equal strength in upper and lower extremities. No slurred speech or facial droop noted. He is alert to himself and place, unsure of time or situation.

## 2019-07-22 NOTE — H&P (Addendum)
History and Physical    MAYS PAINO OEH:[id number] DOB: February 15, 1951 DOA: 07/22/2019  Referring MD/NP/PA:   PCP: [name], MD   Patient coming from:  The patient is coming from home.  At baseline, pt is independent for most of ADL.        Chief Complaint: AMS, fever and increased urinary frequency  HPI: Edward Briggs is a 68 y.o. male with medical history significant of hypertension, diabetes mellitus, GERD, who presents with altered mental status, fever, increased urinary frequency.  Per patient's wife, patient has been confused in the past several days. He is more sleepy, with unsteady gait and generalized weakness.  No slurred speech, facial droop, no unilateral numbness or tingling to extremities.  Patient has fever and chills.  Patient does not have cough, shortness of breath, chest pain.  No nausea, vomiting, diarrhea, abdominal pain.  Patient has increased urinary frequency and urgency, no dysuria. Pt was seen in UC this AM, and was sent to ED for further evaluation treatment.  ED Course: pt was found to have WBC 17.5, lactic acid 1.9, pending COVID-19 test, positive UA (hazy appearance, large amount of leukocyte, many bacteria, WBC>50). AKI with creatinine 1.53, BUN 15, temperature 100.4, blood pressure 117/77, tachycardia, tachypnea, oxygen saturation 98% on room air.  Chest x-ray showed subtle possible bilateral basilar opacity.  CT head is negative for acute intracranial abnormalities, but showed possible sinusitis.  Patient is admitted to telemetry bed as inpatient.  Review of Systems: Could not reviewed expiratory due to confusion.   Allergy: No Known Allergies  Past Medical History:  Diagnosis Date  . Arthritis   . Diabetes mellitus without complication ([place])   . GERD (gastroesophageal reflux disease)    no meds    Past Surgical History:  Procedure Laterality Date  . ABSCESS DRAINAGE    . FRACTURE SURGERY    . HARDWARE REMOVAL  03/26/2012   Procedure:  HARDWARE REMOVAL;  Surgeon: [name], MD;  Location: [facility];  Service: Orthopedics;  Laterality: Left;  hardware removal deep left ankle syndesmotic screws only and stress xrays ankle   . ORIF ANKLE FRACTURE  12/03/2011   Procedure: OPEN REDUCTION INTERNAL FIXATION (ORIF) ANKLE FRACTURE;  Surgeon: [name], MD;  Location: [place] [facility];  Service: Orthopedics;  Laterality: Left;  left medial malleolus fracture  . PILONIDAL CYST EXCISION  ~1982  . SKIN GRAFT  ~1970   Lt great toe (graft from Lt thigh)    Social History:  reports that he quit smoking about 30 years ago. He has never used smokeless tobacco. He reports current alcohol use of about 1.0 standard drinks of alcohol per week. He reports that he does not use drugs.  Family History:  Family History  Problem Relation Age of Onset  . Cancer Mother   . Cancer Father      Prior to Admission medications   Medication Sig Start Date End Date Taking? Authorizing Provider  Ascorbic Acid (VITAMIN C PO) Take 1 tablet by mouth daily.    Yes [provider]  aspirin 81 MG tablet Take 81 mg by mouth daily.   Yes [provider]  LANTUS SOLOSTAR 100 UNIT/ML Solostar Pen INJECT 10 UNITS INTO THE SKIN DAILY. Patient taking differently: Inject 20 Units into the skin at bedtime.  06/10/19  Yes [name], MD  lisinopril (ZESTRIL) 5 MG tablet Take 1 tablet (5 mg total) by mouth daily. 04/21/19  Yes [name],  [name], MD  metFORMIN (GLUCOPHAGE) 850 MG tablet Take 1 tablet (850 mg total) by mouth 2 (two) times daily with a meal. 03/22/19  Yes [name], MD  Blood Glucose Monitoring Suppl (BLOOD GLUCOSE METER) kit Use as instructed 09/10/12   [name], MD  glucose blood (ACCU-CHEK AVIVA) test strip Use as instructed-  2-3 time a day 04/21/19   [name], MD  Insulin Pen Needle (PEN NEEDLES) 32G X 4 MM MISC 1 application by Does not apply route daily. BD nano ultrafine  03/22/19   [name], MD    Physical Exam: Vitals:   07/22/19 2129 07/22/19 2145 07/22/19 2200 07/22/19 2215  BP: 96/65 114/74 106/78 113/71  Pulse: 83 83 84 83  Resp: (!) 21 (!) 22 19 19   Temp:      TempSrc:      SpO2: 97% 98% 100% 99%  Weight:      Height:       General: Not in acute distress HEENT:       Eyes: PERRL, EOMI, no scleral icterus.       ENT: No discharge from the ears and nose, no pharynx injection, no tonsillar enlargement.        Neck: No JVD, no bruit, no mass felt. Heme: No neck lymph node enlargement. Cardiac: S1/S2, RRR, No murmurs, No gallops or rubs. Respiratory: No rales, wheezing, rhonchi or rubs. GI: Soft, nondistended, nontender, no rebound pain, no organomegaly, BS present. GU: No hematuria Ext: No pitting leg edema bilaterally. 2+DP/PT pulse bilaterally. Musculoskeletal: No joint deformities, No joint redness or warmth, no limitation of ROM in spin. Skin: No rashes.  Neuro: confused, knows his own name, but not oriented to place and time. cranial nerves II-XII grossly intact, moves all extremities. Psych: Patient is not psychotic, no suicidal or hemocidal ideation.  Labs on Admission: I have personally reviewed following labs and imaging studies  CBC: Recent Labs  Lab 07/22/19 1228  WBC 17.5*  HGB 13.7  HCT 40.9  MCV 94.9  PLT 194   Basic Metabolic Panel: Recent Labs  Lab 07/22/19 1228  NA 131*  K 4.3  CL 97*  CO2 22  GLUCOSE 262*  BUN 15  CREATININE 1.53*  CALCIUM 8.9   GFR: Estimated Creatinine Clearance: 63 mL/min (A) (by C-G formula based on SCr of 1.53 mg/dL (H)). Liver Function Tests: Recent Labs  Lab 07/22/19 1228  AST 16  ALT 17  ALKPHOS 53  BILITOT 1.2  PROT 7.7  ALBUMIN 3.6   No results for input(s): LIPASE, AMYLASE in the last 168 hours. No results for input(s): AMMONIA in the last 168 hours. Coagulation Profile: No results for input(s): INR, PROTIME in the last 168 hours. Cardiac Enzymes: No  results for input(s): CKTOTAL, CKMB, CKMBINDEX, TROPONINI in the last 168 hours. BNP (last 3 results) No results for input(s): PROBNP in the last 8760 hours. HbA1C: No results for input(s): HGBA1C in the last 72 hours. CBG: Recent Labs  Lab 07/22/19 1218  GLUCAP 252*   Lipid Profile: No results for input(s): CHOL, HDL, LDLCALC, TRIG, CHOLHDL, LDLDIRECT in the last 72 hours. Thyroid Function Tests: No results for input(s): TSH, T4TOTAL, FREET4, T3FREE, THYROIDAB in the last 72 hours. Anemia Panel: No results for input(s): VITAMINB12, FOLATE, FERRITIN, TIBC, IRON, RETICCTPCT in the last 72 hours. Urine analysis:    Component Value Date/Time   COLORURINE YELLOW 07/22/2019 2128   APPEARANCEUR HAZY (A) 07/22/2019 2128   LABSPEC 1.009  07/22/2019 2128   PHURINE 5.0 07/22/2019 2128   GLUCOSEU NEGATIVE 07/22/2019 2128   HGBUR MODERATE (A) 07/22/2019 2128   BILIRUBINUR NEGATIVE 07/22/2019 2128   BILIRUBINUR negative 12/07/2018 0922   BILIRUBINUR neg 09/10/2012 1254   KETONESUR NEGATIVE 07/22/2019 2128   PROTEINUR NEGATIVE 07/22/2019 2128   UROBILINOGEN 0.2 12/07/2018 0922   NITRITE NEGATIVE 07/22/2019 2128   LEUKOCYTESUR LARGE (A) 07/22/2019 2128   Sepsis Labs: @LABRCNTIP (procalcitonin:4,lacticidven:4) )No results found for this or any previous visit (from the past 240 hour(s)).   Radiological Exams on Admission: Ct Head Wo Contrast  Result Date: 07/22/2019 CLINICAL DATA:  Altered mental status EXAM: CT HEAD WITHOUT CONTRAST TECHNIQUE: Contiguous axial images were obtained from the base of the skull through the vertex without intravenous contrast. COMPARISON:  None. FINDINGS: Brain: No evidence of acute infarction, hemorrhage, hydrocephalus, extra-axial collection or mass lesion/mass effect. Periventricular white matter hypodensity. Vascular: No hyperdense vessel or unexpected calcification. Skull: Normal. Negative for fracture or focal lesion. Sinuses/Orbits: Mucoid retention cysts  and frothy secretions present in the maxillary sinuses. Mild mucosal thickening. Other: None. IMPRESSION: 1. No acute intracranial pathology. Small-vessel white matter disease. 2. Mucoid retention cysts and frothy secretions present in the maxillary sinuses. Mild mucosal thickening. Correlate for signs and symptoms of sinusitis. Electronically Signed   By: Eddie Candle M.D.   On: 07/22/2019 20:24   Dg Chest Port 1 View  Result Date: 07/22/2019 CLINICAL DATA:  Fever EXAM: PORTABLE CHEST 1 VIEW COMPARISON:  None. FINDINGS: The heart size and mediastinal contours are within normal limits. Subtle bibasilar heterogeneous airspace opacity. The visualized skeletal structures are unremarkable. IMPRESSION: Subtle bibasilar heterogeneous airspace opacity, concerning for infection or aspiration. There may be chronic underlying scarring or interstitial change. Electronically Signed   By: Eddie Candle M.D.   On: 07/22/2019 18:08     EKG: Not done in ED, will get one.   Assessment/Plan Principal Problem:   UTI (urinary tract infection) Active Problems:   Diabetes mellitus without complication (H[facility])   HTN (hypertension)   Acute metabolic encephalopathy   AKI (acute kidney injury) ([place])   Sepsis ([place])   Sepsis due to UTI: Patient has symptoms of UTI, urinalysis positive for UTI.  Patient meets criteria for sepsis with leukocytosis, fever, tachycardia and tachypnea.  Lactic acid 1.9.  Currently hemodynamically stable.  Chest x-ray showed subtle bilateral basilar opacity, but the patient does not have any symptoms of pneumonia, clinically patient does not seem to have pneumonia.  Will treat patient as UTI.   - Admit to telemetry bed as inpt -  Ceftriaxone by IV (patient received 1 dose of azithromycin in ED) - Follow up results of urine and blood cx and amend antibiotic regimen if needed per sensitivity results - prn Zofran for nausea - will get Procalcitonin and trend lactic acid levels per sepsis  protocol. - IVF: total of 2L of NS bolus in ED, followed by 125 cc/h   Diabetes mellitus without complication ([place]): Last A1c 8.6 on 03/22/19, poorly controled. Patient is taking metformin and Lantus at home.  Blood sugar 262 -will decrease Lantus dose from 20 to 14 units daily. -SSI  HTN (hypertension): Blood pressure 117/77 -hold lisinopril due to AKI -Monitor blood pressure  AKI (acute kidney injury) ([place]): Creatinine 1.53, BUN 15.  Likely due to UTI. -IV fluid as above -Hold lisinopril - Follow-up renal function by BMP  Acute metabolic encephalopathy: Most likely due to sepsis and UTI.  CT head is negative for acute intracranial  abnormalities. -Frequent neuro checks.   Inpatient status:  # Patient requires inpatient status due to high intensity of service, high risk for further deterioration and high frequency of surveillance required.  I certify that at the point of admission it is my clinical judgment that the patient will require inpatient hospital care spanning beyond 2 midnights from the point of admission.  . This patient has multiple chronic comorbidities including  hypertension, diabetes mellitus, GERD. . Now patient has presenting with sepsis due to UTI, AMS and AKI . The worrisome physical exam findings include AMS. . The initial radiographic and laboratory data are worrisome because of leukocytosis, sepsis, AKI . Current medical needs: please see my assessment and plan . Predictability of an adverse outcome (risk): Patients multiple comorbidities, now presents with sepsis due to UTI.  Patient also has altered mental status and AKI.  His presentation is highly complicated.  Patient is at high risk of deterioration, such as developing hypotension due to sepsis.  Patient will need to be treated in hospital for at least 2 days.      DVT ppx: SQ Heparin      Code Status: Full code Family Communication:   Yes, patient's wife  at bed side Disposition Plan:  Anticipate  discharge back to previous home environment Consults called:  None Admission status:  Inpatient/tele      Date of Service 07/22/2019    [facility]   If 7PM-7AM, please contact night-coverage [email or web address] Password [email or web address] 07/22/2019, 10:50 PM

## 2019-07-22 NOTE — Discharge Instructions (Addendum)
68 year old male with history of insulin-dependent diabetes, uncontrolled, comes in with spouse for 2 to 3-day history of hyperglycemia, intermittent confusion/altered mental status, ataxia. Denies one sided weakness. Does admit to dizziness on ambulation. Denies recent history of head injury/LOC, syncope. Febrile at 100.4, without URI symptoms, abdominal, nausea, vomiting, diarrhea. Has urinary frequency, without dysuria, hematuria.  CBG 264 in office. Patient unable to ambulate on own, swaying while walking. Slow finger to nose and requires correction. No facial drooping, aphasia. CN II-XII grossly intact.   Given history and exam, discharge in stable condition to the ED for further evaluation needed.

## 2019-07-22 NOTE — ED Provider Notes (Signed)
[facility]    CSN: [id number] Arrival date & time: 07/22/19  1047      History   Chief Complaint Chief Complaint  Patient presents with  . Hyperglycemia    HPI Edward Briggs is a 68 y.o. male.   68 year old male with history of uncontrolled insulin-dependent diabetes comes in with spouse for 2 to 3-day history of confusion/altered mental status, fatigue, ataxia.  HPI obtained by patient with assistance from spouse.  He has had intermittent confusion/lethargy with no obvious aggravating or alleviating factor.  He admits to dizziness, especially with ambulation.  States at baseline, he is able to walk on own without difficulty.  For the past 2 to 3 days, he has been taking "baby steps", and has needed assistance from spouse in order to ambulate.  He uses spouse as a guide when walking.  He denies any recent falls, head injury, loss of consciousness.  Denies chest pain, shortness of breath, palpitation.  Denies one-sided weakness.  Had subjective fever last night without chills or body aches.  Denies URI symptoms such as cough, congestion, sore throat.  States he has had urinary frequency without dysuria or hematuria.  Denies abdominal pain, nausea, vomiting, diarrhea.  Has had hyperglycemia, with CBG ranging 200s to 300s.  Last A1c 8.6.     Past Medical History:  Diagnosis Date  . Arthritis   . Diabetes mellitus without complication ([place])   . GERD (gastroesophageal reflux disease)    no meds    Patient Active Problem List   Diagnosis Date Noted  . Type II or unspecified type diabetes mellitus without mention of complication, uncontrolled 09/10/2012  . GASTROESOPHAGEAL REFLUX DISEASE 07/21/2009  . CHEST PAIN 07/21/2009    Past Surgical History:  Procedure Laterality Date  . ABSCESS DRAINAGE    . FRACTURE SURGERY    . HARDWARE REMOVAL  03/26/2012   Procedure: HARDWARE REMOVAL;  Surgeon: [name], MD;  Location: [place];  Service:  Orthopedics;  Laterality: Left;  hardware removal deep left ankle syndesmotic screws only and stress xrays ankle   . ORIF ANKLE FRACTURE  12/03/2011   Procedure: OPEN REDUCTION INTERNAL FIXATION (ORIF) ANKLE FRACTURE;  Surgeon: [name], MD;  Location: [facility];  Service: Orthopedics;  Laterality: Left;  left medial malleolus fracture  . PILONIDAL CYST EXCISION  ~1982  . SKIN GRAFT  ~1970   Lt great toe (graft from Lt thigh)       Home Medications    Prior to Admission medications   Medication Sig Start Date End Date Taking? Authorizing Provider  Ascorbic Acid (VITAMIN C PO) Take by mouth.    [provider]  aspirin 81 MG tablet Take 81 mg by mouth daily.    [provider]  Blood Glucose Monitoring Suppl (BLOOD GLUCOSE METER) kit Use as instructed 09/10/12   [name], MD  fluconazole (DIFLUCAN) 150 MG tablet TAKE 1 TABLET BY MOUTH AS ONE DOSE 12/07/18   [provider]  glucose blood (ACCU-CHEK AVIVA) test strip Use as instructed-  2-3 time a day 04/21/19   [name], MD  hydrocortisone 1 % lotion Apply 1 application topically 2 (two) times daily. 12/07/18   [name], MD  Insulin Pen Needle (PEN NEEDLES) 32G X 4 MM MISC 1 application by Does not apply route daily. BD nano ultrafine 03/22/19   [name], MD  LANTUS SOLOSTAR 100 UNIT/ML Solostar Pen INJECT 10 UNITS  INTO THE SKIN DAILY. 06/10/19   [name], MD  lisinopril (ZESTRIL) 5 MG tablet Take 1 tablet (5 mg total) by mouth daily. 04/21/19   [name], MD  metFORMIN (GLUCOPHAGE) 850 MG tablet Take 1 tablet (850 mg total) by mouth 2 (two) times daily with a meal. 03/22/19   [name], MD    Family History Family History  Problem Relation Age of Onset  . Cancer Mother   . Cancer Father     Social History Social History   Tobacco Use  . Smoking status: Former Smoker    Quit date: 10/30/1988    Years since quitting: 30.7  .  Smokeless tobacco: Never Used  Substance Use Topics  . Alcohol use: Yes    Alcohol/week: 1.0 standard drinks    Types: 1 Shots of liquor per week    Comment: every two weeks  . Drug use: No     Allergies   Patient has no known allergies.   Review of Systems Review of Systems  Reason unable to perform ROS: See HPI as above.     Physical Exam Triage Vital Signs ED Triage Vitals [07/22/19 1057]  Enc Vitals Group     BP 115/71     Pulse Rate (!) 103     Resp 18     Temp (!) 100.4 F (38 C)     Temp Source Oral     SpO2      Weight      Height      Head Circumference      Peak Flow      Pain Score 0     Pain Loc      Pain Edu?      Excl. in [place]?    No data found.  Updated Vital Signs BP 115/71 (BP Location: Left Arm)   Pulse (!) 103   Temp (!) 100.4 F (38 C) (Oral)   Resp 18   Physical Exam Constitutional:      General: He is not in acute distress.    Appearance: Normal appearance. He is not ill-appearing, toxic-appearing or diaphoretic.  HENT:     Head: Normocephalic and atraumatic.     Mouth/Throat:     Mouth: Mucous membranes are moist.     Pharynx: Oropharynx is clear. Uvula midline.  Eyes:     Extraocular Movements: Extraocular movements intact.     Conjunctiva/sclera: Conjunctivae normal.     Pupils: Pupils are equal, round, and reactive to light.  Neck:     Musculoskeletal: Normal range of motion and neck supple.  Cardiovascular:     Rate and Rhythm: Normal rate and regular rhythm.     Heart sounds: Normal heart sounds. No murmur. No friction rub. No gallop.   Pulmonary:     Effort: Pulmonary effort is normal. No accessory muscle usage, prolonged expiration, respiratory distress or retractions.     Comments: Lungs clear to auscultation without adventitious lung sounds. Skin:    General: Skin is warm and dry.  Neurological:     Mental Status: He is alert and oriented to person, place, and time.     GCS: GCS eye subscore is 4. GCS verbal  subscore is 5. GCS motor subscore is 6.     Comments: Slow movements, needs repeating for directions, but able to follow directions.   Slight drooping to the right cheek/lip, for which spouse states is normal. Otherwise CN grossly intact. Sensation intact. Strength normal and  equal bilaterally.   Abnormal finger to nose, abnormal gait and coordination. Unable to stand or walk in own. Swaying without support. Negative romberg.       UC Treatments / Results  Labs (all labs ordered are listed, but only abnormal results are displayed) Labs Reviewed  POCT FASTING CBG [facility] MANUAL ENTRY - Abnormal; Notable for the following components:      Result Value   POCT Glucose ([facility]) 264 (*)    All other components within normal limits    EKG   Radiology No results found.  Procedures Procedures (including critical care time)  Medications Ordered in UC Medications - No data to display  Initial Impression / Assessment and Plan / UC Course  I have reviewed the triage vital signs and the nursing notes.  Pertinent labs & imaging results that were available during my care of the patient were reviewed by me and considered in my medical decision making (see chart for details).    68 year old male with history of uncontrolled insulin-dependent diabetes, comes in with spouse for 2 to 3-day history of hyperglycemia, intermittent confusion/altered mental status, ataxia, dizziness with ambulation. Denies one sided weakness. Denies recent history of head injury/LOC, syncope. Febrile at 100.4, without URI symptoms, abdominal, nausea, vomiting, diarrhea. Has urinary frequency, without dysuria, hematuria.  CBG 264 in office. Patient unable to ambulate on own, swaying while walking. Abnormal finger to nose, coordination, gait. No facial drooping, aphasia. CN II-XII grossly intact.   Given history and exam, will require further evaluation at the ED. Offered EMS transport given ataxia. Spouse declined at this  time. Will discharge in stable condition to the ED for further evaluation needed.   Final Clinical Impressions(s) / UC Diagnoses   Final diagnoses:  Ataxia  Transient alteration of awareness  Hyperglycemia   ED Prescriptions    None     PDMP not reviewed this encounter.   Ok Edwards, PA-C 07/22/19 1140

## 2019-07-23 ENCOUNTER — Encounter (HOSPITAL_COMMUNITY): Payer: Self-pay | Admitting: *Deleted

## 2019-07-23 DIAGNOSIS — N3000 Acute cystitis without hematuria: Secondary | ICD-10-CM

## 2019-07-23 LAB — CBG MONITORING, ED
Glucose-Capillary: 186 mg/dL — ABNORMAL HIGH (ref 70–99)
Glucose-Capillary: 176 mg/dL — ABNORMAL HIGH (ref 70–99)

## 2019-07-23 LAB — CBC WITH DIFFERENTIAL/PLATELET
Abs Immature Granulocytes: 0.05 10*3/uL (ref 0.00–0.07)
Basophils Absolute: 0 10*3/uL (ref 0.0–0.1)
Basophils Relative: 0 %
Eosinophils Absolute: 0.1 10*3/uL (ref 0.0–0.5)
Eosinophils Relative: 1 %
HCT: 36.7 % — ABNORMAL LOW (ref 39.0–52.0)
Hemoglobin: 12.7 g/dL — ABNORMAL LOW (ref 13.0–17.0)
Immature Granulocytes: 0 %
Lymphocytes Relative: 19 %
Lymphs Abs: 2.6 10*3/uL (ref 0.7–4.0)
MCH: 32.9 pg (ref 26.0–34.0)
MCHC: 34.6 g/dL (ref 30.0–36.0)
MCV: 95.1 fL (ref 80.0–100.0)
Monocytes Absolute: 1.4 10*3/uL — ABNORMAL HIGH (ref 0.1–1.0)
Monocytes Relative: 10 %
Neutro Abs: 9.3 10*3/uL — ABNORMAL HIGH (ref 1.7–7.7)
Neutrophils Relative %: 70 %
Platelets: 169 10*3/uL (ref 150–400)
RBC: 3.86 MIL/uL — ABNORMAL LOW (ref 4.22–5.81)
RDW: 11.9 % (ref 11.5–15.5)
WBC: 13.5 10*3/uL — ABNORMAL HIGH (ref 4.0–10.5)
nRBC: 0 % (ref 0.0–0.2)

## 2019-07-23 LAB — BASIC METABOLIC PANEL
Anion gap: 11 (ref 5–15)
BUN: 14 mg/dL (ref 8–23)
CO2: 22 mmol/L (ref 22–32)
Calcium: 8.2 mg/dL — ABNORMAL LOW (ref 8.9–10.3)
Chloride: 101 mmol/L (ref 98–111)
Creatinine, Ser: 0.89 mg/dL (ref 0.61–1.24)
GFR calc Af Amer: >60 mL/min (ref >60)
GFR calc non Af Amer: >60 mL/min (ref >60)
Glucose, Bld: 201 mg/dL — ABNORMAL HIGH (ref 70–99)
Potassium: 3.9 mmol/L (ref 3.5–5.1)
Sodium: 134 mmol/L — ABNORMAL LOW (ref 135–145)

## 2019-07-23 LAB — RAPID URINE DRUG SCREEN, HOSP PERFORMED
Amphetamines: NOT DETECTED
Barbiturates: NOT DETECTED
Benzodiazepines: NOT DETECTED
Cocaine: NOT DETECTED
Opiates: NOT DETECTED
Tetrahydrocannabinol: NOT DETECTED

## 2019-07-23 LAB — GLUCOSE, CAPILLARY
Glucose-Capillary: 248 mg/dL — ABNORMAL HIGH (ref 70–99)
Glucose-Capillary: 217 mg/dL — ABNORMAL HIGH (ref 70–99)

## 2019-07-23 LAB — SARS CORONAVIRUS 2 (TAT 6-24 HRS): SARS Coronavirus 2: NEGATIVE

## 2019-07-23 LAB — HIV ANTIBODY (ROUTINE TESTING W REFLEX): HIV Screen 4th Generation wRfx: NONREACTIVE

## 2019-07-23 LAB — PROCALCITONIN: Procalcitonin: 1.36 ng/mL

## 2019-07-23 LAB — LACTIC ACID, PLASMA: Lactic Acid, Venous: 1.4 mmol/L (ref 0.5–1.9)

## 2019-07-23 MED ORDER — ACETAMINOPHEN 650 MG RE SUPP: 650.0000 mg | Freq: Four times a day (QID) | RECTAL | Status: DC | PRN

## 2019-07-23 MED ORDER — HEPARIN SODIUM (PORCINE) 5000 UNIT/ML IJ SOLN
5000.0000 [IU] | Freq: Three times a day (TID) | INTRAMUSCULAR | Status: DC
  Administered 2019-07-23 – 2019-07-25 (×7): 5000 [IU] via SUBCUTANEOUS
  Filled 2019-07-23 (×7): qty 1

## 2019-07-23 MED ORDER — ACETAMINOPHEN 325 MG PO TABS: 650.0000 mg | ORAL_TABLET | Freq: Four times a day (QID) | ORAL | Status: DC | PRN

## 2019-07-23 MED ORDER — ASPIRIN 81 MG PO CHEW
81.0000 mg | CHEWABLE_TABLET | Freq: Every day | ORAL | Status: DC
  Administered 2019-07-23 – 2019-07-25 (×3): 81 mg via ORAL
  Filled 2019-07-23 (×3): qty 1

## 2019-07-23 MED ORDER — VITAMIN C 500 MG PO TABS
500.0000 mg | ORAL_TABLET | Freq: Every day | ORAL | Status: DC
  Administered 2019-07-23 – 2019-07-25 (×3): 500 mg via ORAL
  Filled 2019-07-23 (×3): qty 1

## 2019-07-23 MED ORDER — SODIUM CHLORIDE 0.9 % IV SOLN
INTRAVENOUS | Status: AC
  Administered 2019-07-23: 15:00:00 via INTRAVENOUS

## 2019-07-23 MED ORDER — INSULIN ASPART 100 UNIT/ML ~~LOC~~ SOLN
0.0000 [IU] | Freq: Three times a day (TID) | SUBCUTANEOUS | Status: DC
  Administered 2019-07-23: 12:00:00 2 [IU] via SUBCUTANEOUS
  Administered 2019-07-23: 17:00:00 3 [IU] via SUBCUTANEOUS
  Administered 2019-07-23: 08:00:00 2 [IU] via SUBCUTANEOUS
  Administered 2019-07-24: 7 [IU] via SUBCUTANEOUS
  Administered 2019-07-24: 3 [IU] via SUBCUTANEOUS
  Administered 2019-07-24 – 2019-07-25 (×2): 2 [IU] via SUBCUTANEOUS

## 2019-07-23 MED ORDER — INSULIN GLARGINE 100 UNIT/ML ~~LOC~~ SOLN
14.0000 [IU] | Freq: Every day | SUBCUTANEOUS | Status: DC
  Filled 2019-07-23: qty 0.14

## 2019-07-23 MED ORDER — ONDANSETRON HCL 4 MG/2ML IJ SOLN: 4.0000 mg | Freq: Four times a day (QID) | INTRAMUSCULAR | Status: DC | PRN

## 2019-07-23 MED ORDER — ONDANSETRON HCL 4 MG PO TABS: 4.0000 mg | ORAL_TABLET | Freq: Four times a day (QID) | ORAL | Status: DC | PRN

## 2019-07-23 MED ORDER — INSULIN GLARGINE 100 UNIT/ML ~~LOC~~ SOLN
20.0000 [IU] | Freq: Every day | SUBCUTANEOUS | Status: DC
  Administered 2019-07-23: 20 [IU] via SUBCUTANEOUS
  Filled 2019-07-23 (×2): qty 0.2

## 2019-07-23 MED ORDER — SODIUM CHLORIDE 0.9 % IV SOLN
1.0000 g | INTRAVENOUS | Status: DC
  Administered 2019-07-23 – 2019-07-24 (×2): 1 g via INTRAVENOUS
  Filled 2019-07-23: qty 10
  Filled 2019-07-23 (×2): qty 1

## 2019-07-23 NOTE — ED Notes (Signed)
Lunch tray ordered 

## 2019-07-23 NOTE — ED Notes (Signed)
ED TO INPATIENT HANDOFF REPORT  ED Nurse Name and Phone #: Ophelia Charter RN [phone]  S Name/Age/Gender [name] 68 y.o. male Room/Bed: 025C/025C  Code Status   Code Status: Full Code  Home/SNF/Other Home Patient oriented to: self, place, time and situation Is this baseline? Yes   Triage Complete: Triage complete  Chief Complaint AMS, unsteady gait, diabetic  Triage Note Pt to ER for evaluation of altered mental status and unsteady gait onset Wednesday. Wife is not present to obtain more information. CBG in triage 252. Patient is calm and cooperative. Equal strength in upper and lower extremities. No slurred speech or facial droop noted. He is alert to himself and place, unsure of time or situation.    Allergies No Known Allergies  Level of Care/Admitting Diagnosis ED Disposition    ED Disposition Condition Comment   Admit  Hospital Area: [facility] [[id number]]  Level of Care: Med-Surg [16]  Covid Evaluation: Confirmed COVID Negative  Diagnosis: UTI (urinary tract infectionUT:[id number]  Admitting Physician: [place], [place]  Attending Physician: [name] [[id number]]  Estimated length of stay: past midnight tomorrow  Certification:: I certify this patient will need inpatient services for at least 2 midnights  PT Class (Do Not Modify): Inpatient [101]  PT Acc Code (Do Not Modify): Private [1]       B Medical/Surgery History Past Medical History:  Diagnosis Date  . Arthritis   . Diabetes mellitus without complication ([place])   . GERD (gastroesophageal reflux disease)    no meds   Past Surgical History:  Procedure Laterality Date  . ABSCESS DRAINAGE    . FRACTURE SURGERY    . HARDWARE REMOVAL  03/26/2012   Procedure: HARDWARE REMOVAL;  Surgeon: [name], MD;  Location: [facility];  Service: Orthopedics;  Laterality: Left;  hardware removal deep left ankle syndesmotic screws only and stress xrays ankle   . ORIF ANKLE  FRACTURE  12/03/2011   Procedure: OPEN REDUCTION INTERNAL FIXATION (ORIF) ANKLE FRACTURE;  Surgeon: [name], MD;  Location: [facility];  Service: Orthopedics;  Laterality: Left;  left medial malleolus fracture  . PILONIDAL CYST EXCISION  ~1982  . SKIN GRAFT  ~1970   Lt great toe (graft from Lt thigh)     A IV Location/Drains/Wounds Patient Lines/Drains/Airways Status   Active Line/Drains/Airways    Name:   Placement date:   Placement time:   Site:   Days:   Peripheral IV 07/22/19 Right Antecubital   07/22/19    1732    Antecubital   1   Peripheral IV 07/23/19 Right Wrist   07/23/19    0241    Wrist   less than 1   Incision 12/03/11 Other (Comment) Left   12/03/11    1217     2789   Incision 03/26/12 Other (Comment) Left   03/26/12    1220     2675          Intake/Output Last 24 hours  Intake/Output Summary (Last 24 hours) at 07/23/2019 1234 Last data filed at 07/23/2019 0239 Gross per 24 hour  Intake 850 ml  Output 800 ml  Net 50 ml    Labs/Imaging Results for orders placed or performed during the hospital encounter of 07/22/19 (from the past 48 hour(s))  CBG monitoring, ED     Status: Abnormal   Collection Time: 07/22/19 12:18 PM  Result Value Ref Range   Glucose-Capillary 252 (H) 70 -  99 mg/dL  Comprehensive metabolic panel     Status: Abnormal   Collection Time: 07/22/19 12:28 PM  Result Value Ref Range   Sodium 131 (L) 135 - 145 mmol/L   Potassium 4.3 3.5 - 5.1 mmol/L   Chloride 97 (L) 98 - 111 mmol/L   CO2 22 22 - 32 mmol/L   Glucose, Bld 262 (H) 70 - 99 mg/dL   BUN 15 8 - 23 mg/dL   Creatinine, Ser 1.53 (H) 0.61 - 1.24 mg/dL   Calcium 8.9 8.9 - 10.3 mg/dL   Total Protein 7.7 6.5 - 8.1 g/dL   Albumin 3.6 3.5 - 5.0 g/dL   AST 16 15 - 41 U/L   ALT 17 0 - 44 U/L   Alkaline Phosphatase 53 38 - 126 U/L   Total Bilirubin 1.2 0.3 - 1.2 mg/dL   GFR calc non Af Amer 46 (L) >60 mL/min   GFR calc Af Amer 54 (L) >60 mL/min   Anion gap 12 5 - 15     Comment: Performed at [facility] [address] [place], [place] [address]  CBC     Status: Abnormal   Collection Time: 07/22/19 12:28 PM  Result Value Ref Range   WBC 17.5 (H) 4.0 - 10.5 K/uL   RBC 4.31 4.22 - 5.81 MIL/uL   Hemoglobin 13.7 13.0 - 17.0 g/dL   HCT 40.9 39.0 - 52.0 %   MCV 94.9 80.0 - 100.0 fL   MCH 31.8 26.0 - 34.0 pg   MCHC 33.5 30.0 - 36.0 g/dL   RDW 11.9 11.5 - 15.5 %   Platelets 206 150 - 400 K/uL   nRBC 0.0 0.0 - 0.2 %    Comment: Performed at [facility], [place] [address], [place], [place] [address]  SARS CORONAVIRUS 2 (TAT 6-24 HRS) Nasopharyngeal Nasopharyngeal Swab     Status: None   Collection Time: 07/22/19  5:34 PM   Specimen: Nasopharyngeal Swab  Result Value Ref Range   SARS Coronavirus 2 NEGATIVE NEGATIVE    Comment: (NOTE) SARS-CoV-2 target nucleic acids are NOT DETECTED. The SARS-CoV-2 RNA is generally detectable in upper and lower respiratory specimens during the acute phase of infection. Negative results do not preclude SARS-CoV-2 infection, do not rule out co-infections with other pathogens, and should not be used as the sole basis for treatment or other patient management decisions. Negative results must be combined with clinical observations, patient history, and epidemiological information. The expected result is Negative. Fact Sheet for Patients: SugarRoll.be Fact Sheet for Healthcare Providers: https://www.woods-mathews.com/ This test is not yet approved or cleared by the [place] FDA and  has been authorized for detection and/or diagnosis of SARS-CoV-2 by FDA under an Emergency Use Authorization (EUA). This EUA will remain  in effect (meaning this test can be used) for the duration of the COVID-19 declaration under Section 56 4(b)(1) of the Act, 21 U.S.C. section 360bbb-3(b)(1), unless the authorization is terminated or revoked sooner. Performed at [facility],  [place] [address] [place], [place] [address]   Lactic acid, plasma     Status: None   Collection Time: 07/22/19  5:34 PM  Result Value Ref Range   Lactic Acid, Venous 1.9 0.5 - 1.9 mmol/L    Comment: Performed at [facility] [address] [place],  [address]  Urinalysis, Routine w reflex microscopic     Status: Abnormal   Collection Time: 07/22/19  9:28 PM  Result Value  Ref Range   Color, Urine YELLOW YELLOW   APPearance HAZY (A) CLEAR   Specific Gravity, Urine 1.009 1.005 - 1.030   pH 5.0 5.0 - 8.0   Glucose, UA NEGATIVE NEGATIVE mg/dL   Hgb urine dipstick MODERATE (A) NEGATIVE   Bilirubin Urine NEGATIVE NEGATIVE   Ketones, ur NEGATIVE NEGATIVE mg/dL   Protein, ur NEGATIVE NEGATIVE mg/dL   Nitrite NEGATIVE NEGATIVE   Leukocytes,Ua LARGE (A) NEGATIVE   RBC / HPF 6-10 0 - 5 RBC/hpf   WBC, UA >50 (H) 0 - 5 WBC/hpf   Bacteria, UA MANY (A) NONE SEEN   Squamous Epithelial / LPF 0-5 0 - 5   WBC Clumps PRESENT    Mucus PRESENT    Hyaline Casts, UA PRESENT     Comment: Performed at [facility] [address], [place], [place] [address]  Procalcitonin     Status: None   Collection Time: 07/23/19  3:27 AM  Result Value Ref Range   Procalcitonin 1.36 ng/mL    Comment:        Interpretation: PCT > 0.5 ng/mL and <= 2 ng/mL: Systemic infection (sepsis) is possible, but other conditions are known to elevate PCT as well. (NOTE)       Sepsis PCT Algorithm           Lower Respiratory Tract                                      Infection PCT Algorithm    ----------------------------     ----------------------------         PCT < 0.25 ng/mL                PCT < 0.10 ng/mL         Strongly encourage             Strongly discourage   discontinuation of antibiotics    initiation of antibiotics    ----------------------------     -----------------------------       PCT 0.25 - 0.50 ng/mL            PCT 0.10 - 0.25 ng/mL               OR       >80% decrease in PCT             Discourage initiation of                                            antibiotics      Encourage discontinuation           of antibiotics    ----------------------------     -----------------------------         PCT >= 0.50 ng/mL              PCT 0.26 - 0.50 ng/mL                AND       <80% decrease in PCT             Encourage initiation of  antibiotics       Encourage continuation           of antibiotics    ----------------------------     -----------------------------        PCT >= 0.50 ng/mL                  PCT > 0.50 ng/mL               AND         increase in PCT                  Strongly encourage                                      initiation of antibiotics    Strongly encourage escalation           of antibiotics                                     -----------------------------                                           PCT <= 0.25 ng/mL                                                 OR                                        > 80% decrease in PCT                                     Discontinue / Do not initiate                                             antibiotics Performed at [facility], [address] [place], [place] [address]   Lactic acid, plasma     Status: None   Collection Time: 07/23/19  5:22 AM  Result Value Ref Range   Lactic Acid, Venous 1.4 0.5 - 1.9 mmol/L    Comment: Performed at [facility] [address] [place], [place] [id number]  Basic metabolic panel     Status: Abnormal   Collection Time: 07/23/19  5:22 AM  Result Value Ref Range   Sodium 134 (L) 135 - 145 mmol/L   Potassium 3.9 3.5 - 5.1 mmol/L   Chloride 101 98 - 111 mmol/L   CO2 22 22 - 32 mmol/L   Glucose, Bld 201 (H) 70 - 99 mg/dL   BUN 14 8 - 23 mg/dL   Creatinine, Ser 0.89 0.61 - 1.24 mg/dL   Calcium 8.2 (L) 8.9 - 10.3 mg/dL   GFR calc non Af Amer >60 >60 mL/min   GFR calc Af Amer >60 >  60 mL/min   Anion gap 11 5 - 15     Comment: Performed at [facility] [address] [place], [place] [address]  CBC with Differential/Platelet     Status: Abnormal   Collection Time: 07/23/19  5:22 AM  Result Value Ref Range   WBC 13.5 (H) 4.0 - 10.5 K/uL   RBC 3.86 (L) 4.22 - 5.81 MIL/uL   Hemoglobin 12.7 (L) 13.0 - 17.0 g/dL   HCT 36.7 (L) 39.0 - 52.0 %   MCV 95.1 80.0 - 100.0 fL   MCH 32.9 26.0 - 34.0 pg   MCHC 34.6 30.0 - 36.0 g/dL   RDW 11.9 11.5 - 15.5 %   Platelets 169 150 - 400 K/uL   nRBC 0.0 0.0 - 0.2 %   Neutrophils Relative % 70 %   Neutro Abs 9.3 (H) 1.7 - 7.7 K/uL   Lymphocytes Relative 19 %   Lymphs Abs 2.6 0.7 - 4.0 K/uL   Monocytes Relative 10 %   Monocytes Absolute 1.4 (H) 0.1 - 1.0 K/uL   Eosinophils Relative 1 %   Eosinophils Absolute 0.1 0.0 - 0.5 K/uL   Basophils Relative 0 %   Basophils Absolute 0.0 0.0 - 0.1 K/uL   Immature Granulocytes 0 %   Abs Immature Granulocytes 0.05 0.00 - 0.07 K/uL    Comment: Performed at [facility], [place] [address], [place], [place] [address]  CBG monitoring, ED     Status: Abnormal   Collection Time: 07/23/19  7:54 AM  Result Value Ref Range   Glucose-Capillary 186 (H) 70 - 99 mg/dL  Rapid urine drug screen (hospital performed)     Status: None   Collection Time: 07/23/19  7:55 AM  Result Value Ref Range   Opiates NONE DETECTED NONE DETECTED   Cocaine NONE DETECTED NONE DETECTED   Benzodiazepines NONE DETECTED NONE DETECTED   Amphetamines NONE DETECTED NONE DETECTED   Tetrahydrocannabinol NONE DETECTED NONE DETECTED   Barbiturates NONE DETECTED NONE DETECTED    Comment: (NOTE) DRUG SCREEN FOR MEDICAL PURPOSES ONLY.  IF CONFIRMATION IS NEEDED FOR ANY PURPOSE, NOTIFY LAB WITHIN 5 DAYS. LOWEST DETECTABLE LIMITS FOR URINE DRUG SCREEN Drug Class                     Cutoff (ng/mL) Amphetamine and metabolites    1000 Barbiturate and metabolites    200 Benzodiazepine                 [id number] Tricyclics and metabolites     300 Opiates and  metabolites        300 Cocaine and metabolites        300 THC                            50 Performed at [facility], [place] [address] [place], [place] [address]   CBG monitoring, ED     Status: Abnormal   Collection Time: 07/23/19 12:01 PM  Result Value Ref Range   Glucose-Capillary 176 (H) 70 - 99 mg/dL   Comment 1 Notify RN    Comment 2 Document in Chart    Ct Head Wo Contrast  Result Date: 07/22/2019 CLINICAL DATA:  Altered mental status EXAM: CT HEAD WITHOUT CONTRAST TECHNIQUE: Contiguous axial images were obtained from the base of the skull through the vertex without intravenous contrast. COMPARISON:  None. FINDINGS: Brain: No evidence of  acute infarction, hemorrhage, hydrocephalus, extra-axial collection or mass lesion/mass effect. Periventricular white matter hypodensity. Vascular: No hyperdense vessel or unexpected calcification. Skull: Normal. Negative for fracture or focal lesion. Sinuses/Orbits: Mucoid retention cysts and frothy secretions present in the maxillary sinuses. Mild mucosal thickening. Other: None. IMPRESSION: 1. No acute intracranial pathology. Small-vessel white matter disease. 2. Mucoid retention cysts and frothy secretions present in the maxillary sinuses. Mild mucosal thickening. Correlate for signs and symptoms of sinusitis. Electronically Signed   By: Eddie Candle M.D.   On: 07/22/2019 20:24   Dg Chest Port 1 View  Result Date: 07/22/2019 CLINICAL DATA:  Fever EXAM: PORTABLE CHEST 1 VIEW COMPARISON:  None. FINDINGS: The heart size and mediastinal contours are within normal limits. Subtle bibasilar heterogeneous airspace opacity. The visualized skeletal structures are unremarkable. IMPRESSION: Subtle bibasilar heterogeneous airspace opacity, concerning for infection or aspiration. There may be chronic underlying scarring or interstitial change. Electronically Signed   By: Eddie Candle M.D.   On: 07/22/2019 18:08    Pending Labs Unresulted Labs (From  admission, onward)    Start     Ordered   07/24/19 0500  CBC  Tomorrow morning,   R     07/23/19 1018   07/24/19 [id number]  Basic metabolic panel  Tomorrow morning,   R     07/23/19 1018   07/23/19 0426  HIV antibody (Routine Testing)  Once,   STAT     07/23/19 0425   07/22/19 2149  Urine Culture  Once,   STAT    Comments: Please get clean catch specimen ONLY (DO NOT obtain from urinal)    07/22/19 2148   07/22/19 2148  Culture, blood (Routine X 2) w Reflex to ID Panel  BLOOD CULTURE X 2,   R (with STAT occurrences)    Comments: Please obtain prior to antibiotic administration.    07/22/19 2148          Vitals/Pain Today's Vitals   07/23/19 0840 07/23/19 0958 07/23/19 1000 07/23/19 1004  BP: 109/66 121/77 111/74   Pulse: 76 80    Resp: 19 (!) 22 20   Temp:      TempSrc:      SpO2: 98% 98%    Weight:      Height:      PainSc:    0-No pain    Isolation Precautions No active isolations  Medications Medications  cefTRIAXone (ROCEPHIN) 1 g in sodium chloride 0.9 % 100 mL IVPB (has no administration in time range)  insulin aspart (novoLOG) injection 0-9 Units (2 Units Subcutaneous Given 07/23/19 1208)  heparin injection 5,000 Units (5,000 Units Subcutaneous Given 07/23/19 0533)  acetaminophen (TYLENOL) tablet 650 mg (has no administration in time range)    Or  acetaminophen (TYLENOL) suppository 650 mg (has no administration in time range)  ondansetron (ZOFRAN) tablet 4 mg (has no administration in time range)    Or  ondansetron (ZOFRAN) injection 4 mg (has no administration in time range)  aspirin chewable tablet 81 mg (81 mg Oral Given 07/23/19 1012)  vitamin C (ASCORBIC ACID) tablet 500 mg (500 mg Oral Given 07/23/19 1012)  0.9 %  sodium chloride infusion ( Intravenous Not Given 07/23/19 1057)  insulin glargine (LANTUS) injection 20 Units (has no administration in time range)  acetaminophen (TYLENOL) tablet 1,000 mg (1,000 mg Oral Given 07/22/19 1831)  sodium chloride 0.9 %  bolus 500 mL (0 mLs Intravenous Stopped 07/22/19 2123)  cefTRIAXone (ROCEPHIN) 1 g in sodium chloride 0.9 % 100  mL IVPB (0 g Intravenous Stopped 07/22/19 2123)  azithromycin (ZITHROMAX) 500 mg in sodium chloride 0.9 % 250 mL IVPB (0 mg Intravenous Stopped 07/22/19 2123)  0.9 %  sodium chloride infusion ( Intravenous Stopped 07/23/19 0146)  sodium chloride 0.9 % bolus 1,500 mL (0 mLs Intravenous Stopped 07/23/19 0147)    Mobility walks with person assist Moderate fall risk   Focused Assessments    R Recommendations: See Admitting Provider Note  Report given to:   Additional Notes:

## 2019-07-23 NOTE — Progress Notes (Signed)
PROGRESS NOTE    Edward Briggs  [id number] DOB: 03-14-1951 DOA: 07/22/2019 PCP: [name], MD  Brief Narrative: 68 year old male with history of 2 diabetes mellitus, arthritis, GERD presented to the ER with weakness, lethargy, unsteady gait, urinary frequency for 2 to 3 days.  He was noted to have leukocytosis of 17.5, abnormal urinalysis and chest x-ray showed some subtle possible bilateral basilar opacity, COVID PCR was negative   Assessment & Plan:   Sepsis/secondary to UTI -Admitted with confusion and lethargy from above infection -Clinically improving, continue IV ceftriaxone -Follow-up blood and urine culture -Monitor for urinary retention -Needs urology follow-up, concern for possible BPH based on symptoms  Toxic metabolic encephalopathy -Secondary to UTI, sepsis -Improving, CT head unremarkable, physical therapy eval  Acute kidney injury -In the setting of above, improving with hydration -Hold lisinopril  Type 2 diabetes mellitus -Last A1c in May was 8.6, on metformin and Lantus at home, continue Lantus at a lower dose, sliding scale insulin  DVT prophylaxis:Hep SQ Code Status: Full Code Family Communication: none at bedside Disposition Plan: Home in 1--2days    Procedures:   Antimicrobials:    Subjective: -Feels better today, denies incomplete voiding now  Objective: Vitals:   07/23/19 0600 07/23/19 0840 07/23/19 0958 07/23/19 1000  BP: 124/88 109/66 121/77 111/74  Pulse: 78 76 80   Resp: (!) 21 19 (!) 22 20  Temp:      TempSrc:      SpO2: 99% 98% 98%   Weight:      Height:        Intake/Output Summary (Last 24 hours) at 07/23/2019 1129 Last data filed at 07/23/2019 0239 Gross per 24 hour  Intake 850 ml  Output 800 ml  Net 50 ml   Filed Weights   07/22/19 1856  Weight: 117.9 kg    Examination:  General exam: Pleasant male appears much older than stated age, AAO x2, no distress Respiratory system: clear bilaterally  Cardiovascular system: S1 & S2 heard, RRR  Gastrointestinal system: Abdomen is nondistended, soft and nontender.Normal bowel sounds heard. Central nervous system: Alert and oriented x2. No focal neurological deficits. Extremities: No edema Skin: No rashes, lesions or ulcers Psychiatry: Mood & affect appropriate.     Data Reviewed:   CBC: Recent Labs  Lab 07/22/19 1228 07/23/19 0522  WBC 17.5* 13.5*  NEUTROABS  --  9.3*  HGB 13.7 12.7*  HCT 40.9 36.7*  MCV 94.9 95.1  PLT 206 [id number]   Basic Metabolic Panel: Recent Labs  Lab 07/22/19 1228 07/23/19 0522  NA 131* 134*  K 4.3 3.9  CL 97* 101  CO2 22 22  GLUCOSE 262* 201*  BUN 15 14  CREATININE 1.53* 0.89  CALCIUM 8.9 8.2*   GFR: Estimated Creatinine Clearance: 108.3 mL/min (by C-G formula based on SCr of 0.89 mg/dL). Liver Function Tests: Recent Labs  Lab 07/22/19 1228  AST 16  ALT 17  ALKPHOS 53  BILITOT 1.2  PROT 7.7  ALBUMIN 3.6   No results for input(s): LIPASE, AMYLASE in the last 168 hours. No results for input(s): AMMONIA in the last 168 hours. Coagulation Profile: No results for input(s): INR, PROTIME in the last 168 hours. Cardiac Enzymes: No results for input(s): CKTOTAL, CKMB, CKMBINDEX, TROPONINI in the last 168 hours. BNP (last 3 results) No results for input(s): PROBNP in the last 8760 hours. HbA1C: No results for input(s): HGBA1C in the last 72 hours. CBG: Recent Labs  Lab 07/22/19 1218 07/23/19 0754  GLUCAP 252* 186*   Lipid Profile: No results for input(s): CHOL, HDL, LDLCALC, TRIG, CHOLHDL, LDLDIRECT in the last 72 hours. Thyroid Function Tests: No results for input(s): TSH, T4TOTAL, FREET4, T3FREE, THYROIDAB in the last 72 hours. Anemia Panel: No results for input(s): VITAMINB12, FOLATE, FERRITIN, TIBC, IRON, RETICCTPCT in the last 72 hours. Urine analysis:    Component Value Date/Time   COLORURINE YELLOW 07/22/2019 2128   APPEARANCEUR HAZY (A) 07/22/2019 2128   LABSPEC 1.009  07/22/2019 2128   PHURINE 5.0 07/22/2019 2128   GLUCOSEU NEGATIVE 07/22/2019 2128   HGBUR MODERATE (A) 07/22/2019 2128   BILIRUBINUR NEGATIVE 07/22/2019 2128   BILIRUBINUR negative 12/07/2018 0922   BILIRUBINUR neg 09/10/2012 1254   KETONESUR NEGATIVE 07/22/2019 2128   PROTEINUR NEGATIVE 07/22/2019 2128   UROBILINOGEN 0.2 12/07/2018 0922   NITRITE NEGATIVE 07/22/2019 2128   LEUKOCYTESUR LARGE (A) 07/22/2019 2128   Sepsis Labs: @LABRCNTIP (procalcitonin:4,lacticidven:4)  ) Recent Results (from the past 240 hour(s))  SARS CORONAVIRUS 2 (TAT 6-24 HRS) Nasopharyngeal Nasopharyngeal Swab     Status: None   Collection Time: 07/22/19  5:34 PM   Specimen: Nasopharyngeal Swab  Result Value Ref Range Status   SARS Coronavirus 2 NEGATIVE NEGATIVE Final    Comment: (NOTE) SARS-CoV-2 target nucleic acids are NOT DETECTED. The SARS-CoV-2 RNA is generally detectable in upper and lower respiratory specimens during the acute phase of infection. Negative results do not preclude SARS-CoV-2 infection, do not rule out co-infections with other pathogens, and should not be used as the sole basis for treatment or other patient management decisions. Negative results must be combined with clinical observations, patient history, and epidemiological information. The expected result is Negative. Fact Sheet for Patients: SugarRoll.be Fact Sheet for Healthcare Providers: https://www.woods-mathews.com/ This test is not yet approved or cleared by the [place] FDA and  has been authorized for detection and/or diagnosis of SARS-CoV-2 by FDA under an Emergency Use Authorization (EUA). This EUA will remain  in effect (meaning this test can be used) for the duration of the COVID-19 declaration under Section 56 4(b)(1) of the Act, 21 U.S.C. section 360bbb-3(b)(1), unless the authorization is terminated or revoked sooner. Performed at [facility], [place]  [address] [place], [place] [address]          Radiology Studies: Ct Head Wo Contrast  Result Date: 07/22/2019 CLINICAL DATA:  Altered mental status EXAM: CT HEAD WITHOUT CONTRAST TECHNIQUE: Contiguous axial images were obtained from the base of the skull through the vertex without intravenous contrast. COMPARISON:  None. FINDINGS: Brain: No evidence of acute infarction, hemorrhage, hydrocephalus, extra-axial collection or mass lesion/mass effect. Periventricular white matter hypodensity. Vascular: No hyperdense vessel or unexpected calcification. Skull: Normal. Negative for fracture or focal lesion. Sinuses/Orbits: Mucoid retention cysts and frothy secretions present in the maxillary sinuses. Mild mucosal thickening. Other: None. IMPRESSION: 1. No acute intracranial pathology. Small-vessel white matter disease. 2. Mucoid retention cysts and frothy secretions present in the maxillary sinuses. Mild mucosal thickening. Correlate for signs and symptoms of sinusitis. Electronically Signed   By: Eddie Candle M.D.   On: 07/22/2019 20:24   Dg Chest Port 1 View  Result Date: 07/22/2019 CLINICAL DATA:  Fever EXAM: PORTABLE CHEST 1 VIEW COMPARISON:  None. FINDINGS: The heart size and mediastinal contours are within normal limits. Subtle bibasilar heterogeneous airspace opacity. The visualized skeletal structures are unremarkable. IMPRESSION: Subtle bibasilar heterogeneous airspace opacity, concerning for infection or aspiration. There may be chronic underlying scarring or interstitial change. Electronically Signed  By: Eddie Candle M.D.   On: 07/22/2019 18:08        Scheduled Meds: . aspirin  81 mg Oral Daily  . heparin  5,000 Units Subcutaneous Q8H  . insulin aspart  0-9 Units Subcutaneous TID WC  . insulin glargine  14 Units Subcutaneous QHS  . vitamin C  500 mg Oral Daily   Continuous Infusions: . sodium chloride    . cefTRIAXone (ROCEPHIN)  IV       LOS: 1 day    Time spent: 4min     [name], MD [facility]  07/23/2019, 11:29 AM

## 2019-07-23 NOTE — ED Notes (Signed)
Patient was placed on a hospital bed

## 2019-07-23 NOTE — ED Notes (Signed)
Hospitalist at bedside 

## 2019-07-24 LAB — CBC
HCT: 38.4 % — ABNORMAL LOW (ref 39.0–52.0)
Hemoglobin: 12.7 g/dL — ABNORMAL LOW (ref 13.0–17.0)
MCH: 31.2 pg (ref 26.0–34.0)
MCHC: 33.1 g/dL (ref 30.0–36.0)
MCV: 94.3 fL (ref 80.0–100.0)
Platelets: 177 10*3/uL (ref 150–400)
RBC: 4.07 MIL/uL — ABNORMAL LOW (ref 4.22–5.81)
RDW: 11.8 % (ref 11.5–15.5)
WBC: 10.8 10*3/uL — ABNORMAL HIGH (ref 4.0–10.5)
nRBC: 0 % (ref 0.0–0.2)

## 2019-07-24 LAB — BASIC METABOLIC PANEL
Anion gap: 11 (ref 5–15)
BUN: 13 mg/dL (ref 8–23)
CO2: 21 mmol/L — ABNORMAL LOW (ref 22–32)
Calcium: 8.3 mg/dL — ABNORMAL LOW (ref 8.9–10.3)
Chloride: 103 mmol/L (ref 98–111)
Creatinine, Ser: 0.82 mg/dL (ref 0.61–1.24)
GFR calc Af Amer: >60 mL/min (ref >60)
GFR calc non Af Amer: >60 mL/min (ref >60)
Glucose, Bld: 176 mg/dL — ABNORMAL HIGH (ref 70–99)
Potassium: 4.5 mmol/L (ref 3.5–5.1)
Sodium: 135 mmol/L (ref 135–145)

## 2019-07-24 LAB — GLUCOSE, CAPILLARY
Glucose-Capillary: 170 mg/dL — ABNORMAL HIGH (ref 70–99)
Glucose-Capillary: 307 mg/dL — ABNORMAL HIGH (ref 70–99)
Glucose-Capillary: 238 mg/dL — ABNORMAL HIGH (ref 70–99)
Glucose-Capillary: 268 mg/dL — ABNORMAL HIGH (ref 70–99)

## 2019-07-24 LAB — URINE CULTURE: Culture: NO GROWTH

## 2019-07-24 MED ORDER — INSULIN GLARGINE 100 UNIT/ML ~~LOC~~ SOLN
25.0000 [IU] | Freq: Every day | SUBCUTANEOUS | Status: DC
  Administered 2019-07-24: 25 [IU] via SUBCUTANEOUS
  Filled 2019-07-24 (×2): qty 0.25

## 2019-07-24 NOTE — Evaluation (Signed)
Physical Therapy Evaluation Patient Details Name: CORNELIS EADIE MRN: [id number] DOB: 05-28-1951 Today's Date: 07/24/2019   History of Present Illness  68 yo male with onset of AMS and sudden LE weakness was admitted with AKI and UTI resulting in acute metabolic encephalopathy from sepsis UTI, leukocytosis.  PMHx:  DM, OA,   Clinical Impression  Pt was seen for mobility evaluation with wife in attendance, and was able to demonstrate better balance control with SPC.  Has a great control of LE strength but in standing is unbalanced and not as reactive to control LOB.  Will work toward home with wife able to assist him, and will obtain SPC to increase his balance control with HHPT to follow and refine the control.  Follow acutely for these needs.    Follow Up Recommendations Home health PT;Supervision for mobility/OOB    Equipment Recommendations  Cane    Recommendations for Other Services       Precautions / Restrictions Precautions Precautions: Fall Precaution Comments: minimal balance loss reaction Restrictions Weight Bearing Restrictions: No      Mobility  Bed Mobility               General bed mobility comments: up in chair when PT arrived  Transfers Overall transfer level: Needs assistance Equipment used: 1 person hand held assist Transfers: Sit to/from Stand Sit to Stand: Min assist         General transfer comment: pt is able to assist with min assist to power up  Ambulation/Gait Ambulation/Gait assistance: Min assist;Min guard Gait Distance (Feet): 180 Feet Assistive device: Rolling walker (2 wheeled);Straight cane;1 person hand held assist Gait Pattern/deviations: Step-through pattern;Wide base of support;Drifts right/left Gait velocity: reduced Gait velocity interpretation: <1.31 ft/sec, indicative of household ambulator General Gait Details: pt was seen to try HHA, SPC and RW with best control and appearance with [facility]  Stairs             Wheelchair Mobility    Modified Rankin (Stroke Patients Only)       Balance Overall balance assessment: Needs assistance Sitting-balance support: Feet supported Sitting balance-Leahy Scale: Fair     Standing balance support: Bilateral upper extremity supported;During functional activity Standing balance-Leahy Scale: Fair Standing balance comment: less than fair dynamically                             Pertinent Vitals/Pain Pain Assessment: No/denies pain    Home Living Family/patient expects to be discharged to:: Private residence Living Arrangements: Spouse/significant other Available Help at Discharge: Family;Available 24 hours/day Type of Home: House Home Access: Stairs to enter Entrance Stairs-Rails: Psychiatric nurse of Steps: 3 Home Layout: Two level Home Equipment: None Additional Comments: pt has been independent with gait prior to this    Prior Function Level of Independence: Independent               Hand Dominance   Dominant Hand: Right    Extremity/Trunk Assessment   Upper Extremity Assessment Upper Extremity Assessment: Overall WFL for tasks assessed    Lower Extremity Assessment Lower Extremity Assessment: Overall WFL for tasks assessed(strength is good but has balance changes)    Cervical / Trunk Assessment Cervical / Trunk Assessment: Normal  Communication   Communication: No difficulties  Cognition Arousal/Alertness: Awake/alert Behavior During Therapy: Flat affect Overall Cognitive Status: Within Functional Limits for tasks assessed  General Comments: pt is quiet but appropriate when he answers      General Comments General comments (skin integrity, edema, etc.): pt is demonstrating balance changes which are significantly helped by using SPC but cannot use RW well.  HHA is helpful but pt is not using PT to correct his balance as well as the cane     Exercises     Assessment/Plan    PT Assessment Patient needs continued PT services  PT Problem List Decreased range of motion;Decreased activity tolerance;Decreased balance;Decreased coordination;Decreased mobility;Decreased knowledge of use of DME       PT Treatment Interventions DME instruction;Gait training;Stair training;Functional mobility training;Therapeutic activities;Therapeutic exercise;Balance training;Neuromuscular re-education;Patient/family education    PT Goals (Current goals can be found in the Care Plan section)  Acute Rehab PT Goals Patient Stated Goal: to feel stronger and use cane correctly PT Goal Formulation: With patient/family Time For Goal Achievement: 07/31/19 Potential to Achieve Goals: Good    Frequency Min 3X/week   Barriers to discharge Inaccessible home environment has stairs inside house    Co-evaluation               AM-PAC PT "6 Clicks" Mobility  Outcome Measure Help needed turning from your back to your side while in a flat bed without using bedrails?: None Help needed moving from lying on your back to sitting on the side of a flat bed without using bedrails?: None Help needed moving to and from a bed to a chair (including a wheelchair)?: A Little Help needed standing up from a chair using your arms (e.g., wheelchair or bedside chair)?: A Little Help needed to walk in hospital room?: A Little Help needed climbing 3-5 steps with a railing? : A Lot 6 Click Score: 19    End of Session Equipment Utilized During Treatment: Gait belt Activity Tolerance: Patient tolerated treatment well Patient left: in chair;with call bell/phone within reach;with family/visitor present Nurse Communication: Mobility status PT Visit Diagnosis: Unsteadiness on feet (R26.81);Difficulty in walking, not elsewhere classified (R26.2)    Time: [id number] PT Time Calculation (min) (ACUTE ONLY): 31 min   Charges:   PT Evaluation $PT Eval Moderate Complexity: 1  Mod PT Treatments $Gait Training: 8-22 mins       Ramond Dial 07/24/2019, 5:33 PM   Mee Hives, PT MS Acute Rehab Dept. Number: [place]

## 2019-07-24 NOTE — Progress Notes (Signed)
PROGRESS NOTE    Edward Briggs  [id number] DOB: 1951/04/25 DOA: 07/22/2019 PCP: [name], MD  Brief Narrative: 68 year old male with history of 2 diabetes mellitus, arthritis, GERD presented to the ER with weakness, lethargy, unsteady gait, urinary frequency for 2 to 3 days.  He was noted to have leukocytosis of 17.5, abnormal urinalysis and chest x-ray showed some subtle possible bilateral basilar opacity, COVID PCR was negative -Improving with antibiotics and fluids  Assessment & Plan:   Sepsis/secondary to UTI -Admitted with confusion and lethargy from above infection -Good clinical improvement with IV ceftriaxone, mental status has improved back to baseline -Blood culture negative, surprisingly urine culture is also negative however given clinical response abnormal urinalysis and symptomatology will still treat for UTI, changed to oral antibiotics tomorrow -Monitor for urinary retention -Needs urology follow-up, concern for possible BPH based on symptoms  Toxic metabolic encephalopathy -Secondary to UTI, sepsis -Improved, CT head unremarkable, physical therapy eval -Ambulate today  Acute kidney injury -In the setting of above, improving with hydration -Hold lisinopril -Resolved  Type 2 diabetes mellitus -Last A1c in May was 8.6, on metformin and Lantus at home, increase Lantus to 25 units, sliding scale insulin  DVT prophylaxis:Hep SQ Code Status: Full Code Family Communication: none at bedside, called and updated wife Disposition Plan: Home tomorrow if stable    Procedures:   Antimicrobials:    Subjective:  -Feels much better, feels like his mind is clearer energy is better  Objective: Vitals:   07/23/19 1626 07/23/19 2052 07/24/19 0425 07/24/19 0744  BP: 119/80 119/74 131/86 131/84  Pulse: 76 84 73 70  Resp: 19 (!) 22 16 18   Temp: 98.5 F (36.9 C) 98.9 F (37.2 C) 98.4 F (36.9 C) 98.4 F (36.9 C)  TempSrc: Oral Oral Oral Oral  SpO2: 100%  100% 100% 100%  Weight:      Height:        Intake/Output Summary (Last 24 hours) at 07/24/2019 1302 Last data filed at 07/24/2019 0859 Gross per 24 hour  Intake 596 ml  Output 2025 ml  Net -1429 ml   Filed Weights   07/22/19 1856  Weight: 117.9 kg    Examination:  Gen: Awake, Alert, Oriented X 3, appears much older than stated age, pleasant no distress HEENT: PERRLA, Neck supple, no JVD Lungs: Clear bilaterally CVS: RRR,No Gallops,Rubs or new Murmurs Abd: soft, Non tender, non distended, BS present Extremities: No edema Skin: no new rashes Psychiatry: Mood & affect appropriate.     Data Reviewed:   CBC: Recent Labs  Lab 07/22/19 1228 07/23/19 0522 07/24/19 0453  WBC 17.5* 13.5* 10.8*  NEUTROABS  --  9.3*  --   HGB 13.7 12.7* 12.7*  HCT 40.9 36.7* 38.4*  MCV 94.9 95.1 94.3  PLT 206 169 [id number]   Basic Metabolic Panel: Recent Labs  Lab 07/22/19 1228 07/23/19 0522 07/24/19 0453  NA 131* 134* 135  K 4.3 3.9 4.5  CL 97* 101 103  CO2 22 22 21*  GLUCOSE 262* 201* 176*  BUN 15 14 13   CREATININE 1.53* 0.89 0.82  CALCIUM 8.9 8.2* 8.3*   GFR: Estimated Creatinine Clearance: 117.6 mL/min (by C-G formula based on SCr of 0.82 mg/dL). Liver Function Tests: Recent Labs  Lab 07/22/19 1228  AST 16  ALT 17  ALKPHOS 53  BILITOT 1.2  PROT 7.7  ALBUMIN 3.6   No results for input(s): LIPASE, AMYLASE in the last 168 hours. No results for input(s): AMMONIA in the  last 168 hours. Coagulation Profile: No results for input(s): INR, PROTIME in the last 168 hours. Cardiac Enzymes: No results for input(s): CKTOTAL, CKMB, CKMBINDEX, TROPONINI in the last 168 hours. BNP (last 3 results) No results for input(s): PROBNP in the last 8760 hours. HbA1C: No results for input(s): HGBA1C in the last 72 hours. CBG: Recent Labs  Lab 07/23/19 1201 07/23/19 1659 07/23/19 2054 07/24/19 0741 07/24/19 1149  GLUCAP 176* 248* 217* 170* 307*   Lipid Profile: No results for  input(s): CHOL, HDL, LDLCALC, TRIG, CHOLHDL, LDLDIRECT in the last 72 hours. Thyroid Function Tests: No results for input(s): TSH, T4TOTAL, FREET4, T3FREE, THYROIDAB in the last 72 hours. Anemia Panel: No results for input(s): VITAMINB12, FOLATE, FERRITIN, TIBC, IRON, RETICCTPCT in the last 72 hours. Urine analysis:    Component Value Date/Time   COLORURINE YELLOW 07/22/2019 2128   APPEARANCEUR HAZY (A) 07/22/2019 2128   LABSPEC 1.009 07/22/2019 2128   PHURINE 5.0 07/22/2019 2128   GLUCOSEU NEGATIVE 07/22/2019 2128   HGBUR MODERATE (A) 07/22/2019 2128   BILIRUBINUR NEGATIVE 07/22/2019 2128   BILIRUBINUR negative 12/07/2018 0922   BILIRUBINUR neg 09/10/2012 1254   KETONESUR NEGATIVE 07/22/2019 2128   PROTEINUR NEGATIVE 07/22/2019 2128   UROBILINOGEN 0.2 12/07/2018 0922   NITRITE NEGATIVE 07/22/2019 2128   LEUKOCYTESUR LARGE (A) 07/22/2019 2128   Sepsis Labs: @LABRCNTIP (procalcitonin:4,lacticidven:4)  ) Recent Results (from the past 240 hour(s))  Culture, blood (Routine X 2) w Reflex to ID Panel     Status: None (Preliminary result)   Collection Time: 07/22/19  5:33 PM   Specimen: BLOOD  Result Value Ref Range Status   Specimen Description BLOOD RIGHT ANTECUBITAL  Final   Special Requests   Final    BOTTLES DRAWN AEROBIC AND ANAEROBIC Blood Culture adequate volume   Culture   Final    NO GROWTH < 24 HOURS Performed at [facility], [place] [address], [place], [place] [address]    Report Status PENDING  Incomplete  SARS CORONAVIRUS 2 (TAT 6-24 HRS) Nasopharyngeal Nasopharyngeal Swab     Status: None   Collection Time: 07/22/19  5:34 PM   Specimen: Nasopharyngeal Swab  Result Value Ref Range Status   SARS Coronavirus 2 NEGATIVE NEGATIVE Final    Comment: (NOTE) SARS-CoV-2 target nucleic acids are NOT DETECTED. The SARS-CoV-2 RNA is generally detectable in upper and lower respiratory specimens during the acute phase of infection. Negative results do not preclude  SARS-CoV-2 infection, do not rule out co-infections with other pathogens, and should not be used as the sole basis for treatment or other patient management decisions. Negative results must be combined with clinical observations, patient history, and epidemiological information. The expected result is Negative. Fact Sheet for Patients: SugarRoll.be Fact Sheet for Healthcare Providers: https://www.woods-mathews.com/ This test is not yet approved or cleared by the [place] FDA and  has been authorized for detection and/or diagnosis of SARS-CoV-2 by FDA under an Emergency Use Authorization (EUA). This EUA will remain  in effect (meaning this test can be used) for the duration of the COVID-19 declaration under Section 56 4(b)(1) of the Act, 21 U.S.C. section 360bbb-3(b)(1), unless the authorization is terminated or revoked sooner. Performed at [facility], [place] [address], [place], [place] [address]   Urine Culture     Status: None   Collection Time: 07/22/19 10:04 PM   Specimen: Urine, Random  Result Value Ref Range Status   Specimen Description URINE, RANDOM  Final   Special Requests NONE  Final   Culture   Final    NO GROWTH Performed at [facility], [place] [address] [place], [place] [address]    Report Status 07/24/2019 FINAL  Final  Culture, blood (Routine X 2) w Reflex to ID Panel     Status: None (Preliminary result)   Collection Time: 07/22/19 10:06 PM   Specimen: BLOOD RIGHT HAND  Result Value Ref Range Status   Specimen Description BLOOD RIGHT HAND  Final   Special Requests   Final    BOTTLES DRAWN AEROBIC AND ANAEROBIC Blood Culture adequate volume   Culture   Final    NO GROWTH < 24 HOURS Performed at [facility], [place] [address], [place], [place] [address]    Report Status PENDING  Incomplete         Radiology Studies: Ct Head Wo Contrast  Result Date: 07/22/2019 CLINICAL DATA:  Altered mental  status EXAM: CT HEAD WITHOUT CONTRAST TECHNIQUE: Contiguous axial images were obtained from the base of the skull through the vertex without intravenous contrast. COMPARISON:  None. FINDINGS: Brain: No evidence of acute infarction, hemorrhage, hydrocephalus, extra-axial collection or mass lesion/mass effect. Periventricular white matter hypodensity. Vascular: No hyperdense vessel or unexpected calcification. Skull: Normal. Negative for fracture or focal lesion. Sinuses/Orbits: Mucoid retention cysts and frothy secretions present in the maxillary sinuses. Mild mucosal thickening. Other: None. IMPRESSION: 1. No acute intracranial pathology. Small-vessel white matter disease. 2. Mucoid retention cysts and frothy secretions present in the maxillary sinuses. Mild mucosal thickening. Correlate for signs and symptoms of sinusitis. Electronically Signed   By: Eddie Candle M.D.   On: 07/22/2019 20:24   Dg Chest Port 1 View  Result Date: 07/22/2019 CLINICAL DATA:  Fever EXAM: PORTABLE CHEST 1 VIEW COMPARISON:  None. FINDINGS: The heart size and mediastinal contours are within normal limits. Subtle bibasilar heterogeneous airspace opacity. The visualized skeletal structures are unremarkable. IMPRESSION: Subtle bibasilar heterogeneous airspace opacity, concerning for infection or aspiration. There may be chronic underlying scarring or interstitial change. Electronically Signed   By: Eddie Candle M.D.   On: 07/22/2019 18:08        Scheduled Meds: . aspirin  81 mg Oral Daily  . heparin  5,000 Units Subcutaneous Q8H  . insulin aspart  0-9 Units Subcutaneous TID WC  . insulin glargine  20 Units Subcutaneous QHS  . vitamin C  500 mg Oral Daily   Continuous Infusions: . cefTRIAXone (ROCEPHIN)  IV 1 g (07/23/19 1714)     LOS: 2 days    Time spent: 39min    [name], MD [facility]  07/24/2019, 1:02 PM

## 2019-07-24 NOTE — Plan of Care (Signed)
  Problem: Pain Managment: Goal: General experience of comfort will improve Outcome: Progressing   Problem: Safety: Goal: Ability to remain free from injury will improve Outcome: Progressing   Problem: Skin Integrity: Goal: Risk for impaired skin integrity will decrease Outcome: Progressing   

## 2019-07-25 LAB — CBC
HCT: 36.4 % — ABNORMAL LOW (ref 39.0–52.0)
Hemoglobin: 12.2 g/dL — ABNORMAL LOW (ref 13.0–17.0)
MCH: 31.6 pg (ref 26.0–34.0)
MCHC: 33.5 g/dL (ref 30.0–36.0)
MCV: 94.3 fL (ref 80.0–100.0)
Platelets: 213 10*3/uL (ref 150–400)
RBC: 3.86 MIL/uL — ABNORMAL LOW (ref 4.22–5.81)
RDW: 11.6 % (ref 11.5–15.5)
WBC: 8.2 10*3/uL (ref 4.0–10.5)
nRBC: 0 % (ref 0.0–0.2)

## 2019-07-25 LAB — GLUCOSE, CAPILLARY: Glucose-Capillary: 158 mg/dL — ABNORMAL HIGH (ref 70–99)

## 2019-07-25 MED ORDER — BLOOD GLUCOSE METER KIT: PACK | 0 refills | Status: DC

## 2019-07-25 MED ORDER — CEFDINIR 300 MG PO CAPS
300.0000 mg | ORAL_CAPSULE | Freq: Two times a day (BID) | ORAL | Status: DC
  Administered 2019-07-25: 10:00:00 300 mg via ORAL
  Filled 2019-07-25 (×2): qty 1

## 2019-07-25 MED ORDER — LANTUS SOLOSTAR 100 UNIT/ML ~~LOC~~ SOPN: 20.0000 [IU] | PEN_INJECTOR | Freq: Every day | SUBCUTANEOUS | Status: DC

## 2019-07-25 MED ORDER — CEFDINIR 300 MG PO CAPS: 300.0000 mg | ORAL_CAPSULE | Freq: Two times a day (BID) | ORAL | 0 refills | Status: AC

## 2019-07-25 NOTE — Progress Notes (Signed)
Pt given oral and written discharge instructions. Was given written script for diabetic supplies. Declined cane as recommended by therapy. Volunteer services to assist to vehicle for discharge home.

## 2019-07-25 NOTE — TOC Transition Note (Addendum)
Transition of Care [facility]) - CM/SW Discharge Note   Patient Details  Name: Edward Briggs MRN: [id number] Date of Birth: 22-Nov-1950  [facility]) CM/SW Contact:  Marilu Favre, RN Phone Number: 07/25/2019, 10:05 AM   Clinical Narrative:     PT recommending cane for home. NCM offered to order same through [facility] and have it delivered to hospital room prior to discharge, however, patient declined , he will buy one.   [place] unable, to take  referral. Tommi Rumps with [facility] accepted referral and start of care will be today   Final next level of care: Home w Home Health Services Barriers to Discharge: No Barriers Identified   Patient Goals and CMS Choice Patient states their goals for this hospitalization and ongoing recovery are:: to go home CMS Medicare.gov Compare Post Acute Care list provided to:: Patient Choice offered to / list presented to : Patient  Discharge Placement                       Discharge Plan and Services   Discharge Planning Services: CM Consult Post Acute Care Choice: Home Health          DME Arranged: (Patient refused NCM to order cane, he will buy one)         HH Arranged: PT [place]: [place] ([place]) Date [place]: 07/25/19 Time [place]: 1004 Representative spoke with at [place]: [place] ([place]) Interventions     Readmission Risk Interventions No flowsheet data found.

## 2019-07-25 NOTE — Plan of Care (Signed)
  Problem: Pain Managment: Goal: General experience of comfort will improve Outcome: Progressing   Problem: Safety: Goal: Ability to remain free from injury will improve Outcome: Progressing   Problem: Skin Integrity: Goal: Risk for impaired skin integrity will decrease Outcome: Progressing   

## 2019-07-27 LAB — CULTURE, BLOOD (ROUTINE X 2)
Culture: NO GROWTH
Culture: NO GROWTH
Special Requests: ADEQUATE
Special Requests: ADEQUATE

## 2019-07-28 ENCOUNTER — Other Ambulatory Visit: Payer: Self-pay

## 2019-07-28 ENCOUNTER — Telehealth: Payer: Self-pay | Admitting: Family Medicine

## 2019-07-28 ENCOUNTER — Encounter: Payer: Self-pay | Admitting: Family Medicine

## 2019-07-28 ENCOUNTER — Ambulatory Visit: Payer: Medicare Other

## 2019-07-28 ENCOUNTER — Ambulatory Visit (INDEPENDENT_AMBULATORY_CARE_PROVIDER_SITE_OTHER): Payer: Medicare Other | Admitting: Family Medicine

## 2019-07-28 VITALS — BP 118/77 | HR 63 | Temp 98.4°F | Wt 267.4 lb

## 2019-07-28 DIAGNOSIS — R350 Frequency of micturition: Secondary | ICD-10-CM | POA: Diagnosis not present

## 2019-07-28 DIAGNOSIS — N179 Acute kidney failure, unspecified: Secondary | ICD-10-CM

## 2019-07-28 DIAGNOSIS — A419 Sepsis, unspecified organism: Secondary | ICD-10-CM

## 2019-07-28 DIAGNOSIS — N390 Urinary tract infection, site not specified: Secondary | ICD-10-CM

## 2019-07-28 DIAGNOSIS — Z794 Long term (current) use of insulin: Secondary | ICD-10-CM | POA: Diagnosis not present

## 2019-07-28 DIAGNOSIS — E1165 Type 2 diabetes mellitus with hyperglycemia: Secondary | ICD-10-CM

## 2019-07-28 DIAGNOSIS — N402 Nodular prostate without lower urinary tract symptoms: Secondary | ICD-10-CM

## 2019-07-28 DIAGNOSIS — R9389 Abnormal findings on diagnostic imaging of other specified body structures: Secondary | ICD-10-CM | POA: Diagnosis not present

## 2019-07-28 LAB — POCT URINALYSIS DIP (MANUAL ENTRY)
Bilirubin, UA: NEGATIVE
Blood, UA: NEGATIVE
Glucose, UA: NEGATIVE mg/dL
Ketones, POC UA: NEGATIVE mg/dL
Leukocytes, UA: NEGATIVE
Nitrite, UA: NEGATIVE
Protein Ur, POC: NEGATIVE mg/dL
Spec Grav, UA: 1.02 (ref 1.010–1.025)
Urobilinogen, UA: 0.2 E.U./dL
pH, UA: 5.5 (ref 5.0–8.0)

## 2019-07-28 NOTE — Progress Notes (Addendum)
Subjective:    Patient ID: Edward Briggs, male    DOB: [birth year], 68 y.o.   MRN: [id number]  HPI Edward Briggs is a 68 y.o. male Presents today for: Chief Complaint  Patient presents with  . Hospitalization Follow-up    was seen at ER on 07/22/19 for uti, fever, off balance diarrhea and cunfusion. Dx with UTI. Patient states his balance is better, no problem with urination. ER record did show enlarged prostate and need to be seen by Urologist. Kidney was not in the normal ranger. Last PSA was done 2/20 was normal 0.3   Hospital follow-up.  Admitted September 19 through 21st.  Sepsis secondary to UTI Admitted with confusion, lethargy.  Treated with azithromycin in ED and then  IV ceftriaxone with mental status returning to baseline.  Blood cultures were negative.  Urine culture negative but continued treatment for UTI, transitioned to Omnicef 300 mgTwice daily for 3 additional days. COVID PCR was negative.  Chest x-ray with some subtle possible bilateral basilar opacity.  Leukocytosis initially 17.5. Cane recommended for assistance at home from physical therapy. Possible BPH based on imaging, plan for urology outpatient follow-up.  Last PSA normal at 0.3 in February. Abnormal DRE noted by me in 2018  - nodule noted, eval by urology in 07/2017 - plan for recheck in 6 months. No other visit noted.   Feeling better since hospital No fevers. No abd pain, no confusion. Balance is better - not needing cane at this time.  No urinary retention. Last dose abx today.   Lab Results  Component Value Date   WBC 8.2 07/25/2019   HGB 12.2 (L) 07/25/2019   HCT 36.4 (L) 07/25/2019   MCV 94.3 07/25/2019   PLT 213 07/25/2019   Acute kidney injury: Thought to be due to illness/UTI.  Increased to 1.53 up from 0.85, improved back to baseline at 0.82 lisinopril was held temporarily. I am glad to hear you are feeling better.  Finish antibiotics as ordered, make sure to drink plenty of fluids.  I  will refer you back to Lab Results  Component Value Date   CREATININE 0.82 07/24/2019    Diabetes Continue metformin, Lantus was increased to 25 units with sliding scale insulin in the hospital. Lab Results  Component Value Date   HGBA1C 8.6 (A) 03/22/2019       Patient Active Problem List   Diagnosis Date Noted  . HTN (hypertension) 07/22/2019  . Acute metabolic encephalopathy 14/48/1856  . AKI (acute kidney injury) ([place]) 07/22/2019  . Sepsis ([place]) 07/22/2019  . UTI (urinary tract infection) 07/22/2019  . Diabetes mellitus without complication ([place]) 31/49/7026  . CHEST PAIN 07/21/2009   Past Medical History:  Diagnosis Date  . Diabetes mellitus without complication ([place])   . GERD (gastroesophageal reflux disease)    no meds   Past Surgical History:  Procedure Laterality Date  . ABSCESS DRAINAGE    . FRACTURE SURGERY    . HARDWARE REMOVAL  03/26/2012   Procedure: HARDWARE REMOVAL;  Surgeon: [name], MD;  Location: [place];  Service: Orthopedics;  Laterality: Left;  hardware removal deep left ankle syndesmotic screws only and stress xrays ankle   . ORIF ANKLE FRACTURE  12/03/2011   Procedure: OPEN REDUCTION INTERNAL FIXATION (ORIF) ANKLE FRACTURE;  Surgeon: [name], MD;  Location: [facility];  Service: Orthopedics;  Laterality: Left;  left medial malleolus fracture  . PILONIDAL CYST EXCISION  ~1982  . SKIN  GRAFT  ~1970   Lt great toe (graft from Lt thigh)   No Known Allergies Prior to Admission medications   Medication Sig Start Date End Date Taking? Authorizing Provider  Ascorbic Acid (VITAMIN C PO) Take 1 tablet by mouth daily.    Yes [provider]  aspirin 81 MG tablet Take 81 mg by mouth daily.   Yes [provider]  blood glucose meter kit and supplies Dispense based on patient and insurance preference. Use up to four times daily as directed. (FOR ICD-10 E10.9, E11.9). 07/25/19  Yes [name],  MD  Blood Glucose Monitoring Suppl (BLOOD GLUCOSE METER) kit Use as instructed 09/10/12  Yes [name], MD  cefdinir (OMNICEF) 300 MG capsule Take 1 capsule (300 mg total) by mouth 2 (two) times daily for 3 days. 07/25/19 07/28/19 Yes [name], MD  glucose blood (ACCU-CHEK AVIVA) test strip Use as instructed-  2-3 time a day 04/21/19  Yes [name], MD  Insulin Glargine (LANTUS SOLOSTAR) 100 UNIT/ML Solostar Pen Inject 20 Units into the skin at bedtime. 07/25/19  Yes [name], MD  Insulin Pen Needle (PEN NEEDLES) 32G X 4 MM MISC 1 application by Does not apply route daily. BD nano ultrafine 03/22/19  Yes [name], MD  lisinopril (ZESTRIL) 5 MG tablet Take 1 tablet (5 mg total) by mouth daily. 04/21/19  Yes [name], MD  metFORMIN (GLUCOPHAGE) 850 MG tablet Take 1 tablet (850 mg total) by mouth 2 (two) times daily with a meal. 03/22/19  Yes [name], MD   Social History   Socioeconomic History  . Marital status: Married    Spouse name: Not on file  . Number of children: 2  . Years of education: Not on file  . Highest education level: Not on file  Occupational History  . Not on file  Social Needs  . Financial resource strain: Not on file  . Food insecurity    Worry: Not on file    Inability: Not on file  . Transportation needs    Medical: Not on file    Non-medical: Not on file  Tobacco Use  . Smoking status: Former Smoker    Quit date: 10/30/1988    Years since quitting: 30.7  . Smokeless tobacco: Never Used  Substance and Sexual Activity  . Alcohol use: Yes    Alcohol/week: 1.0 standard drinks    Types: 1 Shots of liquor per week    Comment: every two weeks  . Drug use: No  . Sexual activity: Yes    Birth control/protection: None  Lifestyle  . Physical activity    Days per week: Not on file    Minutes per session: Not on file  . Stress: Not on file  Relationships  . Social Herbalist on phone: Not on file    Gets  together: Not on file    Attends religious service: Not on file    Active member of club or organization: Not on file    Attends meetings of clubs or organizations: Not on file    Relationship status: Not on file  . Intimate partner violence    Fear of current or ex partner: Not on file    Emotionally abused: Not on file    Physically abused: Not on file    Forced sexual activity: Not on file  Other Topics Concern  . Not on file  Social History Narrative  . Not  on file    Review of Systems Per HPI.     Objective:   Physical Exam Vitals signs reviewed.  Constitutional:      Appearance: He is well-developed.  HENT:     Head: Normocephalic and atraumatic.  Eyes:     Pupils: Pupils are equal, round, and reactive to light.  Neck:     Vascular: No carotid bruit or JVD.  Cardiovascular:     Rate and Rhythm: Normal rate and regular rhythm.     Heart sounds: Normal heart sounds. No murmur.  Pulmonary:     Effort: Pulmonary effort is normal.     Breath sounds: Normal breath sounds. No rales.  Skin:    General: Skin is warm and dry.  Neurological:     Mental Status: He is alert and oriented to person, place, and time.    Vitals:   07/28/19 1024  BP: 118/77  Pulse: 63  Temp: 98.4 F (36.9 C)  TempSrc: Oral  SpO2: 100%  Weight: 267 lb 6.4 oz (121.3 kg)  No results found. Results for orders placed or performed in visit on 07/28/19  POCT urinalysis dipstick  Result Value Ref Range   Color, UA yellow yellow   Clarity, UA clear clear   Glucose, UA negative negative mg/dL   Bilirubin, UA negative negative   Ketones, POC UA negative negative mg/dL   Spec Grav, UA 1.020 1.010 - 1.025   Blood, UA negative negative   pH, UA 5.5 5.0 - 8.0   Protein Ur, POC negative negative mg/dL   Urobilinogen, UA 0.2 0.2 or 1.0 E.U./dL   Nitrite, UA Negative Negative   Leukocytes, UA Negative Negative       Assessment & Plan:  Edward Briggs is a 67 y.o. male Sepsis secondary to UTI  ([place]) - Plan: Ambulatory referral to Urology, PSA  Urine frequency - Plan: POCT urinalysis dipstick  Prostate nodule - Plan: Ambulatory referral to Urology, PSA  Abnormal finding on chest xray - Plan: DG Chest 2 View  Type 2 diabetes mellitus with hyperglycemia, with long-term current use of insulin ([place]) - Plan: Hemoglobin A1c  AKI (acute kidney injury) ([place]) - Plan: Basic metabolic panel  Improved since hospitalization.  Continue antibiotics for current plan, but check PSA, and if elevated or recurrence of symptoms may need change in agent or more prolonged treatment.  Urology eval also ordered for follow-up of recent infection as well as follow-up of prior nodule..  Repeat BMP although creatinine had normalized in hospital. Plan for repeat chest x-ray to evaluate questionable basilar findings and ER.  RTC precautions given, with recheck in 2 weeks.  Can discuss diabetes further at that time.  No orders of the defined types were placed in this encounter.  Patient Instructions   I am glad to hear you are feeling better.  Finish antibiotics as prescribed.  If any return of urinary symptoms or previous symptoms when seen at the urgent care, be seen right away.  I referred you back to the urologist as repeat prostate exam as needed and follow-up of the previous nodule.  I did check a PSA today for reference.  Kidney function improved in the hospital but will recheck that today.  Possible abnormal x-ray of chest, so return later this afternoon to have an x-ray performed here.  I will check your diabetes test but please follow-up in the next 2 weeks to discuss that further.    If you have lab work done today  you will be contacted with your lab results within the next 2 weeks.  If you have not heard from us then please contact us. The fastest way to get your results is to register for My Chart.   IF you received an x-ray today, you will receive an invoice from [facility]. Please  contact [facility] at [phone] with questions or concerns regarding your invoice.   IF you received labwork today, you will receive an invoice from [facility]. Please contact [facility] at [phone] with questions or concerns regarding your invoice.   Our billing staff will not be able to assist you with questions regarding bills from these companies.  You will be contacted with the lab results as soon as they are available. The fastest way to get your results is to activate your My Chart account. Instructions are located on the last page of this paperwork. If you have not heard from us regarding the results in 2 weeks, please contact this office.       Signed,    , MD [facility].  07/28/19 7:37 PM   

## 2019-07-28 NOTE — Patient Instructions (Addendum)
I am glad to hear you are feeling better.  Finish antibiotics as prescribed.  If any return of urinary symptoms or previous symptoms when seen at the urgent care, be seen right away.  I referred you back to the urologist as repeat prostate exam as needed and follow-up of the previous nodule.  I did check a PSA today for reference.  Kidney function improved in the hospital but will recheck that today.  Possible abnormal x-ray of chest, so return later this afternoon to have an x-ray performed here.  I will check your diabetes test but please follow-up in the next 2 weeks to discuss that further.    If you have lab work done today you will be contacted with your lab results within the next 2 weeks.  If you have not heard from [place] then please contact us. The fastest way to get your results is to register for My Chart.   IF you received an x-ray today, you will receive an invoice from [facility]. Please contact [facility] at [phone] with questions or concerns regarding your invoice.   IF you received labwork today, you will receive an invoice from [facility]. Please contact [facility] at [phone] with questions or concerns regarding your invoice.   Our billing staff will not be able to assist you with questions regarding bills from these companies.  You will be contacted with the lab results as soon as they are available. The fastest way to get your results is to activate your My Chart account. Instructions are located on the last page of this paperwork. If you have not heard from [place] regarding the results in 2 weeks, please contact this office.

## 2019-07-28 NOTE — Telephone Encounter (Signed)
Pt states that Dr. [name] sent him to [facility] in [place] but would prefer to go to the one in [place].

## 2019-07-29 ENCOUNTER — Other Ambulatory Visit: Payer: Self-pay | Admitting: Family Medicine

## 2019-07-29 DIAGNOSIS — E1165 Type 2 diabetes mellitus with hyperglycemia: Secondary | ICD-10-CM

## 2019-07-29 LAB — HEMOGLOBIN A1C
Est. average glucose Bld gHb Est-mCnc: 223 mg/dL
Hgb A1c MFr Bld: 9.4 % — ABNORMAL HIGH (ref 4.8–5.6)

## 2019-07-29 LAB — BASIC METABOLIC PANEL
BUN/Creatinine Ratio: 13 (ref 10–24)
BUN: 13 mg/dL (ref 8–27)
CO2: 22 mmol/L (ref 20–29)
Calcium: 9.4 mg/dL (ref 8.6–10.2)
Chloride: 100 mmol/L (ref 96–106)
Creatinine, Ser: 1 mg/dL (ref 0.76–1.27)
GFR calc Af Amer: 90 mL/min/{1.73_m2} (ref >59)
GFR calc non Af Amer: 78 mL/min/{1.73_m2} (ref >59)
Glucose: 189 mg/dL — ABNORMAL HIGH (ref 65–99)
Potassium: 4.7 mmol/L (ref 3.5–5.2)
Sodium: 136 mmol/L (ref 134–144)

## 2019-07-29 LAB — PSA: Prostate Specific Ag, Serum: 5.1 ng/mL — ABNORMAL HIGH (ref 0.0–4.0)

## 2019-07-29 MED ORDER — PEN NEEDLES 32G X 4 MM MISC: 1.0000 "application " | Freq: Every day | 2 refills | Status: DC

## 2019-07-29 NOTE — Telephone Encounter (Signed)
Patient was seen in by PCP on 07/28/2019 and forgot to tell PCP he is need of Insulin Pen Needle (PEN NEEDLES) 32G X 4 MM MISC. Informed please allow 48 to 72 hour turn around time  [facility] ([place]), [place] - [address] [phone] (Phone) [phone] (Fax)

## 2019-08-01 ENCOUNTER — Telehealth: Payer: Self-pay | Admitting: Family Medicine

## 2019-08-01 DIAGNOSIS — Z794 Long term (current) use of insulin: Secondary | ICD-10-CM | POA: Diagnosis not present

## 2019-08-01 DIAGNOSIS — Z87440 Personal history of urinary (tract) infections: Secondary | ICD-10-CM | POA: Diagnosis not present

## 2019-08-01 DIAGNOSIS — R350 Frequency of micturition: Secondary | ICD-10-CM | POA: Diagnosis not present

## 2019-08-01 DIAGNOSIS — E119 Type 2 diabetes mellitus without complications: Secondary | ICD-10-CM | POA: Diagnosis not present

## 2019-08-01 DIAGNOSIS — I10 Essential (primary) hypertension: Secondary | ICD-10-CM | POA: Diagnosis not present

## 2019-08-01 DIAGNOSIS — Z87891 Personal history of nicotine dependence: Secondary | ICD-10-CM | POA: Diagnosis not present

## 2019-08-01 DIAGNOSIS — K219 Gastro-esophageal reflux disease without esophagitis: Secondary | ICD-10-CM | POA: Diagnosis not present

## 2019-08-01 DIAGNOSIS — Z9181 History of falling: Secondary | ICD-10-CM | POA: Diagnosis not present

## 2019-08-01 DIAGNOSIS — Z87311 Personal history of (healed) other pathological fracture: Secondary | ICD-10-CM | POA: Diagnosis not present

## 2019-08-01 DIAGNOSIS — G9341 Metabolic encephalopathy: Secondary | ICD-10-CM | POA: Diagnosis not present

## 2019-08-01 DIAGNOSIS — Z7982 Long term (current) use of aspirin: Secondary | ICD-10-CM | POA: Diagnosis not present

## 2019-08-01 DIAGNOSIS — N179 Acute kidney failure, unspecified: Secondary | ICD-10-CM | POA: Diagnosis not present

## 2019-08-01 DIAGNOSIS — M1990 Unspecified osteoarthritis, unspecified site: Secondary | ICD-10-CM | POA: Diagnosis not present

## 2019-08-01 NOTE — Telephone Encounter (Signed)
Home Health Verbal Orders - Caller/Agency: [facility]/ [place] Number: [phone] OT/PT/Skilled Nursing/Social Work/Speech Therapy: PT Frequency: 2x for 3wks, 1x for 2wks, 2x for 1wk, 1x for 2wks.

## 2019-08-02 ENCOUNTER — Telehealth: Payer: Self-pay | Admitting: *Deleted

## 2019-08-02 NOTE — Telephone Encounter (Signed)
Sent to referrals

## 2019-08-02 NOTE — Telephone Encounter (Signed)
Schedule AWV.  

## 2019-08-02 NOTE — Telephone Encounter (Signed)
Patient would like to go to [facility] instead of [facility].  Thank You

## 2019-08-02 NOTE — Telephone Encounter (Signed)
Verbal given 

## 2019-08-03 NOTE — Discharge Summary (Signed)
Physician Discharge Summary  Edward Briggs:[id number] DOB: 1950-12-09 DOA: 07/22/2019  PCP: [name], MD  Admit date: 07/22/2019 Discharge date: 07/25/2019  Time spent: 35 minutes  Recommendations for Outpatient Follow-up:  PCP in 1 week Urology in 1 month  Discharge Diagnoses:  Principal Problem:   UTI (urinary tract infection) Active Problems:   Diabetes mellitus without complication ([place])   HTN (hypertension)   Acute metabolic encephalopathy   AKI (acute kidney injury) ([place])   Sepsis ([place])   Discharge Condition: Improved  Diet recommendation: Diabetic  Filed Weights   07/22/19 1856  Weight: 117.9 kg    History of present illness:  68 year old male with history of 2 diabetes mellitus, arthritis, GERD presented to the ER with weakness, lethargy, unsteady gait, urinary frequency for 2 to 3 days.  He was noted to have leukocytosis of 17.5, abnormal urinalysis and chest x-ray showed some subtle possible bilateral basilar opacity, COVID PCR was negative  Hospital Course:   Sepsis/secondary to UTI -Admitted with confusion and lethargy from above infection -Good clinical improvement with IV ceftriaxone, mental status has improved back to baseline -Blood culture negative, surprisingly urine culture is also negative however given clinical response abnormal urinalysis and symptomatology will still treat for UTI, changed to oral cefdinir today today for 3 more days -Needs urology follow-up, concern for possible BPH based on symptoms  Toxic metabolic encephalopathy -Secondary to UTI, sepsis -Improved, CT head unremarkable, physical therapy eval completed, home health recommended and set up  Acute kidney injury -In the setting of above, improving with hydration -Held lisinopril -Resolved, lisinopril resumed at discharge  Type 2 diabetes mellitus -Last A1c in May was 8.6, on metformin and Lantus at home   Discharge Exam: Vitals:   07/25/19 0515 07/25/19  1007  BP: 135/80 (!) 145/85  Pulse: 66 93  Resp: 18 15  Temp: 98.7 F (37.1 C) 97.9 F (36.6 C)  SpO2: 99% 94%   Gen: Awake, Alert, Oriented X 3, appears much older than stated age, pleasant no distress Lungs: Clear bilaterally CVS: RRR,No Gallops,Rubs or new Murmurs  Discharge Instructions   Discharge Instructions    Diet - low sodium heart healthy   Complete by: As directed    Diet Carb Modified   Complete by: As directed    Increase activity slowly   Complete by: As directed      Allergies as of 07/25/2019   No Known Allergies     Medication List    TAKE these medications   Accu-Chek Aviva test strip Generic drug: glucose blood Use as instructed-  2-3 time a day   aspirin 81 MG tablet Take 81 mg by mouth daily.   blood glucose meter kit and supplies Use as instructed   blood glucose meter kit and supplies Dispense based on patient and insurance preference. Use up to four times daily as directed. (FOR ICD-10 E10.9, E11.9).   Lantus SoloStar 100 UNIT/ML Solostar Pen Generic drug: Insulin Glargine Inject 20 Units into the skin at bedtime. What changed: See the new instructions.   lisinopril 5 MG tablet Commonly known as: ZESTRIL Take 1 tablet (5 mg total) by mouth daily.   metFORMIN 850 MG tablet Commonly known as: GLUCOPHAGE Take 1 tablet (850 mg total) by mouth 2 (two) times daily with a meal.   VITAMIN C PO Take 1 tablet by mouth daily.     ASK your doctor about these medications   cefdinir 300 MG capsule Commonly known as: OMNICEF Take  1 capsule (300 mg total) by mouth 2 (two) times daily for 3 days. Ask about: Should I take this medication?      No Known Allergies Follow-up Information    [name], MD. Schedule an appointment as soon as possible for a visit in 1 week(s).   Specialties: Family Medicine, Sports Medicine Contact information: [place] [address] [phone]        Care, [facility]  Follow up.   Specialty: Home Health Services Contact information: [place] [address] [phone]            The results of significant diagnostics from this hospitalization (including imaging, microbiology, ancillary and laboratory) are listed below for reference.    Significant Diagnostic Studies: Ct Head Wo Contrast  Result Date: 07/22/2019 CLINICAL DATA:  Altered mental status EXAM: CT HEAD WITHOUT CONTRAST TECHNIQUE: Contiguous axial images were obtained from the base of the skull through the vertex without intravenous contrast. COMPARISON:  None. FINDINGS: Brain: No evidence of acute infarction, hemorrhage, hydrocephalus, extra-axial collection or mass lesion/mass effect. Periventricular white matter hypodensity. Vascular: No hyperdense vessel or unexpected calcification. Skull: Normal. Negative for fracture or focal lesion. Sinuses/Orbits: Mucoid retention cysts and frothy secretions present in the maxillary sinuses. Mild mucosal thickening. Other: None. IMPRESSION: 1. No acute intracranial pathology. Small-vessel white matter disease. 2. Mucoid retention cysts and frothy secretions present in the maxillary sinuses. Mild mucosal thickening. Correlate for signs and symptoms of sinusitis. Electronically Signed   By: Eddie Candle M.D.   On: 07/22/2019 20:24   Dg Chest Port 1 View  Result Date: 07/22/2019 CLINICAL DATA:  Fever EXAM: PORTABLE CHEST 1 VIEW COMPARISON:  None. FINDINGS: The heart size and mediastinal contours are within normal limits. Subtle bibasilar heterogeneous airspace opacity. The visualized skeletal structures are unremarkable. IMPRESSION: Subtle bibasilar heterogeneous airspace opacity, concerning for infection or aspiration. There may be chronic underlying scarring or interstitial change. Electronically Signed   By: Eddie Candle M.D.   On: 07/22/2019 18:08    Microbiology: No results found for this or any previous visit (from the past  240 hour(s)).   Labs: Basic Metabolic Panel: Recent Labs  Lab 07/28/19 1145  NA 136  K 4.7  CL 100  CO2 22  GLUCOSE 189*  BUN 13  CREATININE 1.00  CALCIUM 9.4   Liver Function Tests: No results for input(s): AST, ALT, ALKPHOS, BILITOT, PROT, ALBUMIN in the last 168 hours. No results for input(s): LIPASE, AMYLASE in the last 168 hours. No results for input(s): AMMONIA in the last 168 hours. CBC: No results for input(s): WBC, NEUTROABS, HGB, HCT, MCV, PLT in the last 168 hours. Cardiac Enzymes: No results for input(s): CKTOTAL, CKMB, CKMBINDEX, TROPONINI in the last 168 hours. BNP: BNP (last 3 results) No results for input(s): BNP in the last 8760 hours.  ProBNP (last 3 results) No results for input(s): PROBNP in the last 8760 hours.  CBG: No results for input(s): GLUCAP in the last 168 hours.     Signed:  [name] MD.  [facility] 08/03/2019, 4:30 PM

## 2019-08-04 ENCOUNTER — Ambulatory Visit: Payer: Self-pay

## 2019-08-04 DIAGNOSIS — M1990 Unspecified osteoarthritis, unspecified site: Secondary | ICD-10-CM | POA: Diagnosis not present

## 2019-08-04 DIAGNOSIS — I10 Essential (primary) hypertension: Secondary | ICD-10-CM | POA: Diagnosis not present

## 2019-08-04 DIAGNOSIS — N179 Acute kidney failure, unspecified: Secondary | ICD-10-CM | POA: Diagnosis not present

## 2019-08-04 DIAGNOSIS — K219 Gastro-esophageal reflux disease without esophagitis: Secondary | ICD-10-CM | POA: Diagnosis not present

## 2019-08-04 DIAGNOSIS — G9341 Metabolic encephalopathy: Secondary | ICD-10-CM | POA: Diagnosis not present

## 2019-08-04 DIAGNOSIS — E119 Type 2 diabetes mellitus without complications: Secondary | ICD-10-CM | POA: Diagnosis not present

## 2019-08-04 NOTE — Telephone Encounter (Signed)
Physical therapist from [facility] called to report that the patient has high BP today. Left arm 160/100 and again 180/120. Rt arm 130/80's and again 158/ 110. Per patients wife he had taken his lisinopril today about 10 am. Physical therapy is due to hospital encounter for UTI. Patient denies symptoms. Care advice read to patient and his wife and Physical therapist. They verbalized understanding. Call transferred to office for appointment scheduling.  Reason for Disposition . Systolic BP  >= 99991111 OR Diastolic >= [id number]  Answer Assessment - Initial Assessment Questions 1. BLOOD PRESSURE: "What is the blood pressure?" "Did you take at least two measurements 5 minutes apart?"     160/100,and 180/120  left arm, Rt arm 130/80's and 158/110 2. ONSET: "When did you take your blood pressure?"     today 3. HOW: "How did you obtain the blood pressure?" (e.g., visiting nurse, automatic home BP monitor)     Visiting Physical therapist 4. HISTORY: "Do you have a history of high blood pressure?"     yes 5. MEDICATIONS: "Are you taking any medications for blood pressure?" "Have you missed any doses recently?"     No  Took medication around 10 6. OTHER SYMPTOMS: "Do you have any symptoms?" (e.g., headache, chest pain, blurred vision, difficulty breathing, weakness)     none 7. PREGNANCY: "Is there any chance you are pregnant?" "When was your last menstrual period?"     N/A  Protocols used: HIGH BLOOD PRESSURE-A-AH

## 2019-08-05 ENCOUNTER — Encounter: Payer: Self-pay | Admitting: Family Medicine

## 2019-08-05 ENCOUNTER — Other Ambulatory Visit: Payer: Self-pay

## 2019-08-05 ENCOUNTER — Ambulatory Visit (INDEPENDENT_AMBULATORY_CARE_PROVIDER_SITE_OTHER): Payer: Medicare Other | Admitting: Family Medicine

## 2019-08-05 VITALS — BP 143/90 | HR 94 | Temp 98.4°F | Wt 268.4 lb

## 2019-08-05 DIAGNOSIS — I10 Essential (primary) hypertension: Secondary | ICD-10-CM | POA: Diagnosis not present

## 2019-08-05 DIAGNOSIS — R972 Elevated prostate specific antigen [PSA]: Secondary | ICD-10-CM

## 2019-08-05 MED ORDER — LISINOPRIL 5 MG PO TABS: 2.5000 mg | ORAL_TABLET | Freq: Every day | ORAL | 1 refills | Status: DC

## 2019-08-05 NOTE — Progress Notes (Signed)
Subjective:    Patient ID: Edward Briggs, male    DOB: 04/27/1951, 68 y.o.   MRN: [id number]  HPI Edward Briggs is a 68 y.o. male Presents today for: Chief Complaint  Patient presents with  . Hypertension    blood pressure been running high on the left arm   Hypertension: BP Readings from Last 3 Encounters:  08/05/19 (!) 143/90  07/28/19 118/77  07/25/19 (!) 145/85   Lab Results  Component Value Date   CREATININE 1.00 07/28/2019  Hospitalized 919 through 921 for sepsis secondary to UTI.  Acute kidney injury at that time with creatinine up to peak 1.53, then improved to baseline 1.82.  Lisinopril was temporarily held during that time.  Currently off antihypertensives. Home readings elevated - fitness assessment at home - unknown reading.   Glucose ok at home - 119 this am, tolerating meds. Plans to discuss DM at next OV.  Lab Results  Component Value Date   HGBA1C 9.4 (H) 07/28/2019       Sepsis secondary to UTI, possible prostatitis. Treated last month.  Initial azithromycin, ceftriaxone, transitioned to [facility] outpatient.  Symptomatically improved at hospital follow-up September 24.  Clear urinalysis at that time but PSA was elevated at 5.1, increased from 0.3 in February.  No fever, no abd pain, urinating normally. Feels well.  Finished antibiotics about 3-4 days ago.  Appt with urology has been scheduled - around the 19th.      Patient Active Problem List   Diagnosis Date Noted  . HTN (hypertension) 07/22/2019  . Acute metabolic encephalopathy 72/53/6644  . AKI (acute kidney injury) ([place]) 07/22/2019  . Sepsis ([place]) 07/22/2019  . UTI (urinary tract infection) 07/22/2019  . Diabetes mellitus without complication ([place]) 03/47/4259  . CHEST PAIN 07/21/2009   Past Medical History:  Diagnosis Date  . Diabetes mellitus without complication ([place])   . GERD (gastroesophageal reflux disease)    no meds   Past Surgical History:  Procedure Laterality Date  .  ABSCESS DRAINAGE    . FRACTURE SURGERY    . HARDWARE REMOVAL  03/26/2012   Procedure: HARDWARE REMOVAL;  Surgeon: [name], MD;  Location: [place];  Service: Orthopedics;  Laterality: Left;  hardware removal deep left ankle syndesmotic screws only and stress xrays ankle   . ORIF ANKLE FRACTURE  12/03/2011   Procedure: OPEN REDUCTION INTERNAL FIXATION (ORIF) ANKLE FRACTURE;  Surgeon: [name], MD;  Location: [place];  Service: Orthopedics;  Laterality: Left;  left medial malleolus fracture  . PILONIDAL CYST EXCISION  ~1982  . SKIN GRAFT  ~1970   Lt great toe (graft from Lt thigh)   No Known Allergies Prior to Admission medications   Medication Sig Start Date End Date Taking? Authorizing Provider  Ascorbic Acid (VITAMIN C PO) Take 1 tablet by mouth daily.    Yes [provider]  aspirin 81 MG tablet Take 81 mg by mouth daily.   Yes [provider]  blood glucose meter kit and supplies Dispense based on patient and insurance preference. Use up to four times daily as directed. (FOR ICD-10 E10.9, E11.9). 07/25/19  Yes [name], MD  Blood Glucose Monitoring Suppl (BLOOD GLUCOSE METER) kit Use as instructed 09/10/12  Yes [name], MD  glucose blood (ACCU-CHEK AVIVA) test strip Use as instructed-  2-3 time a day 04/21/19  Yes [name], MD  Insulin Glargine (LANTUS SOLOSTAR) 100 UNIT/ML Solostar Pen Inject 20  Units into the skin at bedtime. 07/25/19  Yes [name], MD  Insulin Pen Needle (PEN NEEDLES) 32G X 4 MM MISC 1 application by Does not apply route daily. BD nano ultrafine 07/29/19  Yes [name], MD  lisinopril (ZESTRIL) 5 MG tablet Take 1 tablet (5 mg total) by mouth daily. 04/21/19  Yes [name], MD  metFORMIN (GLUCOPHAGE) 850 MG tablet Take 1 tablet (850 mg total) by mouth 2 (two) times daily with a meal. 03/22/19  Yes [name], MD   Social History   Socioeconomic History  .  Marital status: Married    Spouse name: Not on file  . Number of children: 2  . Years of education: Not on file  . Highest education level: Not on file  Occupational History  . Not on file  Social Needs  . Financial resource strain: Not on file  . Food insecurity    Worry: Not on file    Inability: Not on file  . Transportation needs    Medical: Not on file    Non-medical: Not on file  Tobacco Use  . Smoking status: Former Smoker    Quit date: 10/30/1988    Years since quitting: 30.7  . Smokeless tobacco: Never Used  Substance and Sexual Activity  . Alcohol use: Yes    Alcohol/week: 1.0 standard drinks    Types: 1 Shots of liquor per week    Comment: every two weeks  . Drug use: No  . Sexual activity: Yes    Birth control/protection: None  Lifestyle  . Physical activity    Days per week: Not on file    Minutes per session: Not on file  . Stress: Not on file  Relationships  . Social Herbalist on phone: Not on file    Gets together: Not on file    Attends religious service: Not on file    Active member of club or organization: Not on file    Attends meetings of clubs or organizations: Not on file    Relationship status: Not on file  . Intimate partner violence    Fear of current or ex partner: Not on file    Emotionally abused: Not on file    Physically abused: Not on file    Forced sexual activity: Not on file  Other Topics Concern  . Not on file  Social History Narrative  . Not on file    Review of Systems  Constitutional: Negative for fatigue and unexpected weight change.  Eyes: Negative for visual disturbance.  Respiratory: Negative for cough, chest tightness and shortness of breath.   Cardiovascular: Negative for chest pain, palpitations and leg swelling.  Gastrointestinal: Negative for abdominal pain and blood in stool.  Genitourinary: Negative for decreased urine volume, difficulty urinating, dysuria, frequency, hematuria and urgency.   Neurological: Negative for dizziness, light-headedness and headaches.       Objective:   Physical Exam Vitals signs reviewed.  Constitutional:      Appearance: He is well-developed.  HENT:     Head: Normocephalic and atraumatic.  Eyes:     Pupils: Pupils are equal, round, and reactive to light.  Neck:     Vascular: No carotid bruit or JVD.  Cardiovascular:     Rate and Rhythm: Normal rate and regular rhythm.     Heart sounds: Normal heart sounds. No murmur.  Pulmonary:     Effort: Pulmonary effort is normal.  Breath sounds: Normal breath sounds. No rales.  Abdominal:     General: Abdomen is flat.     Tenderness: There is no abdominal tenderness.  Skin:    General: Skin is warm and dry.  Neurological:     Mental Status: He is alert and oriented to person, place, and time.    Vitals:   08/05/19 0913 08/05/19 0914  BP: 127/80 (!) 143/90  Pulse: 94   Temp: 98.4 F (36.9 C)   TempSrc: Oral   SpO2: 100%   Weight: 268 lb 6.4 oz (121.7 kg)        Assessment & Plan:   Edward Briggs is a 68 y.o. male Elevated PSA - Plan: PSA  -Prior urinary tract infection, sepsis.  Symptoms have resolved.  Completed antibiotics.  Repeat PSA, persistent elevation may need extended course of antibiotics but RTC precautions given the return of symptoms.  Continue follow-up with urology.  Essential hypertension - Plan: lisinopril (ZESTRIL) 5 MG tablet  -Some return of elevated readings.  Borderline on recheck today.  Restart lisinopril at half dosing of 2.5 mg daily, recheck 3 weeks.  Renal function normal on last test.  Elevated A1c at last visit, with some improvement due to stress/infection with hyperglycemia.  Glucose 119 this morning.  Check home readings and bring those to follow-up visit in 3 weeks to review meds/adjust meds if needed.   Meds ordered this encounter  Medications  . lisinopril (ZESTRIL) 5 MG tablet    Sig: Take 0.5 tablets (2.5 mg total) by mouth daily.     Dispense:  45 tablet    Refill:  1   Patient Instructions    Restart lisinopril at lower dose of 1/2 pill/day.  Continue to monitor blood pressures at home.  I will recheck PSA level today for prostate test today, but if you have any signs of urinary infection including frequent urination, burning with urination, trouble starting or stopping stream, abdominal discomfort or other new symptoms be seen right away.  Recheck in 3 weeks to repeat blood pressure and possible recheck blood work.   Return to the clinic or go to the nearest emergency room if any of your symptoms worsen or new symptoms occur.   If you have lab work done today you will be contacted with your lab results within the next 2 weeks.  If you have not heard from [place] then please contact us. The fastest way to get your results is to register for My Chart.   IF you received an x-ray today, you will receive an invoice from [facility]. Please contact [facility] at [phone] with questions or concerns regarding your invoice.   IF you received labwork today, you will receive an invoice from [facility]. Please contact [facility] at [phone] with questions or concerns regarding your invoice.   Our billing staff will not be able to assist you with questions regarding bills from these companies.  You will be contacted with the lab results as soon as they are available. The fastest way to get your results is to activate your My Chart account. Instructions are located on the last page of this paperwork. If you have not heard from [place] regarding the results in 2 weeks, please contact this office.       Signed,   [name], MD [facility] [place].  08/05/19 9:43 AM

## 2019-08-05 NOTE — Patient Instructions (Addendum)
  Restart lisinopril at lower dose of 1/2 pill/day.  Continue to monitor blood pressures at home.  I will recheck PSA level today for prostate test today, but if you have any signs of urinary infection including frequent urination, burning with urination, trouble starting or stopping stream, abdominal discomfort or other new symptoms be seen right away.  Recheck in 3 weeks to repeat blood pressure and possible recheck blood work.   Return to the clinic or go to the nearest emergency room if any of your symptoms worsen or new symptoms occur.   If you have lab work done today you will be contacted with your lab results within the next 2 weeks.  If you have not heard from [place] then please contact us. The fastest way to get your results is to register for My Chart.   IF you received an x-ray today, you will receive an invoice from [facility]. Please contact [facility]iology at [phone] with questions or concerns regarding your invoice.   IF you received labwork today, you will receive an invoice from [facility]. Please contact [facility] at [phone] with questions or concerns regarding your invoice.   Our billing staff will not be able to assist you with questions regarding bills from these companies.  You will be contacted with the lab results as soon as they are available. The fastest way to get your results is to activate your My Chart account. Instructions are located on the last page of this paperwork. If you have not heard from [place] regarding the results in 2 weeks, please contact this office.

## 2019-08-06 LAB — PSA: Prostate Specific Ag, Serum: 1.8 ng/mL (ref 0.0–4.0)

## 2019-08-08 ENCOUNTER — Telehealth: Payer: Self-pay | Admitting: Family Medicine

## 2019-08-08 DIAGNOSIS — N179 Acute kidney failure, unspecified: Secondary | ICD-10-CM | POA: Diagnosis not present

## 2019-08-08 DIAGNOSIS — M1990 Unspecified osteoarthritis, unspecified site: Secondary | ICD-10-CM | POA: Diagnosis not present

## 2019-08-08 DIAGNOSIS — E119 Type 2 diabetes mellitus without complications: Secondary | ICD-10-CM | POA: Diagnosis not present

## 2019-08-08 DIAGNOSIS — K219 Gastro-esophageal reflux disease without esophagitis: Secondary | ICD-10-CM | POA: Diagnosis not present

## 2019-08-08 DIAGNOSIS — I10 Essential (primary) hypertension: Secondary | ICD-10-CM | POA: Diagnosis not present

## 2019-08-08 DIAGNOSIS — G9341 Metabolic encephalopathy: Secondary | ICD-10-CM | POA: Diagnosis not present

## 2019-08-08 NOTE — Telephone Encounter (Signed)
[place] needs to verify patients primary diagnosis. cb is [phone]

## 2019-08-09 ENCOUNTER — Ambulatory Visit: Payer: Self-pay

## 2019-08-09 NOTE — Telephone Encounter (Signed)
given

## 2019-08-10 ENCOUNTER — Ambulatory Visit (INDEPENDENT_AMBULATORY_CARE_PROVIDER_SITE_OTHER): Payer: Medicare Other | Admitting: Family Medicine

## 2019-08-10 VITALS — BP 127/80 | Ht 73.0 in | Wt 268.0 lb

## 2019-08-10 DIAGNOSIS — Z0000 Encounter for general adult medical examination without abnormal findings: Secondary | ICD-10-CM | POA: Diagnosis not present

## 2019-08-10 NOTE — Patient Instructions (Signed)
Thank you for taking time to come for your Medicare Wellness Visit. I appreciate your ongoing commitment to your health goals. Please review the following plan we discussed and let me know if I can assist you in the future.  Leroy Kennedy LPN  Preventive Care 68 Years and Older, Male Preventive care refers to lifestyle choices and visits with your health care provider that can promote health and wellness. This includes:  A yearly physical exam. This is also called an annual well check.  Regular dental and eye exams.  Immunizations.  Screening for certain conditions.  Healthy lifestyle choices, such as diet and exercise. What can I expect for my preventive care visit? Physical exam Your health care provider will check:  Height and weight. These may be used to calculate body mass index (BMI), which is a measurement that tells if you are at a healthy weight.  Heart rate and blood pressure.  Your skin for abnormal spots. Counseling Your health care provider may ask you questions about:  Alcohol, tobacco, and drug use.  Emotional well-being.  Home and relationship well-being.  Sexual activity.  Eating habits.  History of falls.  Memory and ability to understand (cognition).  Work and work Statistician. What immunizations do I need?  Influenza (flu) vaccine  This is recommended every year. Tetanus, diphtheria, and pertussis (Tdap) vaccine  You may need a Td booster every 10 years. Varicella (chickenpox) vaccine  You may need this vaccine if you have not already been vaccinated. Zoster (shingles) vaccine  You may need this after age 66. Pneumococcal conjugate (PCV13) vaccine  One dose is recommended after age 84. Pneumococcal polysaccharide (PPSV23) vaccine  One dose is recommended after age 88. Measles, mumps, and rubella (MMR) vaccine  You may need at least one dose of MMR if you were born in 1957 or later. You may also need a second dose. Meningococcal  conjugate (MenACWY) vaccine  You may need this if you have certain conditions. Hepatitis A vaccine  You may need this if you have certain conditions or if you travel or work in places where you may be exposed to hepatitis A. Hepatitis B vaccine  You may need this if you have certain conditions or if you travel or work in places where you may be exposed to hepatitis B. Haemophilus influenzae type b (Hib) vaccine  You may need this if you have certain conditions. You may receive vaccines as individual doses or as more than one vaccine together in one shot (combination vaccines). Talk with your health care provider about the risks and benefits of combination vaccines. What tests do I need? Blood tests  Lipid and cholesterol levels. These may be checked every 5 years, or more frequently depending on your overall health.  Hepatitis C test.  Hepatitis B test. Screening  Lung cancer screening. You may have this screening every year starting at age 24 if you have a 30-pack-year history of smoking and currently smoke or have quit within the past 15 years.  Colorectal cancer screening. All adults should have this screening starting at age 52 and continuing until age 61. Your health care provider may recommend screening at age 57 if you are at increased risk. You will have tests every 1-10 years, depending on your results and the type of screening test.  Prostate cancer screening. Recommendations will vary depending on your family history and other risks.  Diabetes screening. This is done by checking your blood sugar (glucose) after you have not eaten for  a while (fasting). You may have this done every 1-3 years.  Abdominal aortic aneurysm (AAA) screening. You may need this if you are a current or former smoker.  Sexually transmitted disease (STD) testing. Follow these instructions at home: Eating and drinking  Eat a diet that includes fresh fruits and vegetables, whole grains, lean  protein, and low-fat dairy products. Limit your intake of foods with high amounts of sugar, saturated fats, and salt.  Take vitamin and mineral supplements as recommended by your health care provider.  Do not drink alcohol if your health care provider tells you not to drink.  If you drink alcohol: ? Limit how much you have to 0-2 drinks a day. ? Be aware of how much alcohol is in your drink. In the [place] one drink equals one 12 oz bottle of beer (355 mL), one 5 oz glass of wine (148 mL), or one 1 oz glass of hard liquor (44 mL). Lifestyle  Take daily care of your teeth and gums.  Stay active. Exercise for at least 30 minutes on 5 or more days each week.  Do not use any products that contain nicotine or tobacco, such as cigarettes, e-cigarettes, and chewing tobacco. If you need help quitting, ask your health care provider.  If you are sexually active, practice safe sex. Use a condom or other form of protection to prevent STIs (sexually transmitted infections).  Talk with your health care provider about taking a low-dose aspirin or statin. What's next?  Visit your health care provider once a year for a well check visit.  Ask your health care provider how often you should have your eyes and teeth checked.  Stay up to date on all vaccines. This information is not intended to replace advice given to you by your health care provider. Make sure you discuss any questions you have with your health care provider. Document Released: 11/16/2015 Document Revised: 10/14/2018 Document Reviewed: 10/14/2018 Elsevier Patient Education  2020 [employer].

## 2019-08-10 NOTE — Progress Notes (Signed)
Presents today for [employer] Visit   Date of last exam: 08/05/2019  Interpreter used for this visit? No  I connected with  Deivi C Hatler on 08/10/19 by a Telephone  application and verified that I am speaking with the correct person using two identifiers.   I discussed the limitations of evaluation and management by telemedicine. The patient expressed understanding and agreed to proceed.   Patient Care Team: [name], MD as PCP - General (Family Medicine)   Other items to address today:   Discussed importance of Eye care Discussed dental Discussed immunizations/coloscopy  Follow up with Dr. [name] 08/29/2019 9:20 am   Other Screening: Last screening for diabetes: 07/28/2019 Last lipid screening: 12/07/2018  ADVANCE DIRECTIVES: Discussed: yes On File: no Materials Provided: yes (mailed)  Immunization status:  Immunization History  Administered Date(s) Administered  . Pneumococcal Conjugate-13 04/23/2017  . Pneumococcal Polysaccharide-23 12/07/2018     Health Maintenance Due  Topic Date Due  . COLONOSCOPY  10/23/2001  . OPHTHALMOLOGY EXAM  12/01/2014  . FOOT EXAM  07/14/2019     Functional Status Survey: Is the patient deaf or have difficulty hearing?: No Does the patient have difficulty seeing, even when wearing glasses/contacts?: No Does the patient have difficulty walking or climbing stairs?: No Does the patient have difficulty dressing or bathing?: No Does the patient have difficulty doing errands alone such as visiting a doctor's office or shopping?: No   6CIT Screen 08/10/2019 12/07/2018  What Year? 0 points 0 points  What month? 0 points -  What time? 0 points 0 points  Count back from 20 0 points 0 points  Months in reverse 0 points 0 points  Repeat phrase 8 points 0 points  Total Score 8 -        Clinical Support from 08/10/2019 in [facility]  AUDIT-C Score  2       Home Environment:   Lives in a  two story  No trouble climbing stairs  Yes scattered rugs (advised to put grabbers so do not slip) No grab bars Adequate lighting/ no clutter   Patient Active Problem List   Diagnosis Date Noted  . HTN (hypertension) 07/22/2019  . Acute metabolic encephalopathy 63/84/5364  . AKI (acute kidney injury) ([place]) 07/22/2019  . Sepsis ([place]) 07/22/2019  . UTI (urinary tract infection) 07/22/2019  . Diabetes mellitus without complication ([place]) 68/01/2121  . CHEST PAIN 07/21/2009     Past Medical History:  Diagnosis Date  . Diabetes mellitus without complication ([place])   . GERD (gastroesophageal reflux disease)    no meds     Past Surgical History:  Procedure Laterality Date  . ABSCESS DRAINAGE    . FRACTURE SURGERY    . HARDWARE REMOVAL  03/26/2012   Procedure: HARDWARE REMOVAL;  Surgeon: [name], MD;  Location: [place];  Service: Orthopedics;  Laterality: Left;  hardware removal deep left ankle syndesmotic screws only and stress xrays ankle   . ORIF ANKLE FRACTURE  12/03/2011   Procedure: OPEN REDUCTION INTERNAL FIXATION (ORIF) ANKLE FRACTURE;  Surgeon: [name], MD;  Location: [place];  Service: Orthopedics;  Laterality: Left;  left medial malleolus fracture  . PILONIDAL CYST EXCISION  ~1982  . SKIN GRAFT  ~1970   Lt great toe (graft from Lt thigh)     Family History  Problem Relation Age of Onset  . Cancer Mother   . Cancer Father  Social History   Socioeconomic History  . Marital status: Married    Spouse name: Not on file  . Number of children: 2  . Years of education: Not on file  . Highest education level: Not on file  Occupational History  . Not on file  Social Needs  . Financial resource strain: Not on file  . Food insecurity    Worry: Not on file    Inability: Not on file  . Transportation needs    Medical: Not on file    Non-medical: Not on file  Tobacco Use  . Smoking status: Former Smoker     Quit date: 10/30/1988    Years since quitting: 30.7  . Smokeless tobacco: Never Used  Substance and Sexual Activity  . Alcohol use: Yes    Alcohol/week: 1.0 standard drinks    Types: 1 Shots of liquor per week    Comment: every two weeks  . Drug use: No  . Sexual activity: Yes    Birth control/protection: None  Lifestyle  . Physical activity    Days per week: Not on file    Minutes per session: Not on file  . Stress: Not on file  Relationships  . Social Herbalist on phone: Not on file    Gets together: Not on file    Attends religious service: Not on file    Active member of club or organization: Not on file    Attends meetings of clubs or organizations: Not on file    Relationship status: Not on file  . Intimate partner violence    Fear of current or ex partner: Not on file    Emotionally abused: Not on file    Physically abused: Not on file    Forced sexual activity: Not on file  Other Topics Concern  . Not on file  Social History Narrative  . Not on file     No Known Allergies   Prior to Admission medications   Medication Sig Start Date End Date Taking? Authorizing Provider  Ascorbic Acid (VITAMIN C PO) Take 1 tablet by mouth daily.    Yes [provider]  aspirin 81 MG tablet Take 81 mg by mouth daily.   Yes [provider]  blood glucose meter kit and supplies Dispense based on patient and insurance preference. Use up to four times daily as directed. (FOR ICD-10 E10.9, E11.9). 07/25/19  Yes [name], MD  Blood Glucose Monitoring Suppl (BLOOD GLUCOSE METER) kit Use as instructed 09/10/12  Yes [name], MD  glucose blood (ACCU-CHEK AVIVA) test strip Use as instructed-  2-3 time a day 04/21/19  Yes [name], MD  Insulin Glargine (LANTUS SOLOSTAR) 100 UNIT/ML Solostar Pen Inject 20 Units into the skin at bedtime. 07/25/19  Yes [name], MD  Insulin Pen Needle (PEN NEEDLES) 32G X 4 MM MISC 1 application by Does not  apply route daily. BD nano ultrafine 07/29/19  Yes [name], MD  lisinopril (ZESTRIL) 5 MG tablet Take 0.5 tablets (2.5 mg total) by mouth daily. 08/05/19  Yes [name], MD  metFORMIN (GLUCOPHAGE) 850 MG tablet Take 1 tablet (850 mg total) by mouth 2 (two) times daily with a meal. 03/22/19  Yes [name], MD     Depression screen [facility] 2/9 08/10/2019 08/05/2019 07/28/2019 03/22/2019 01/21/2019  Decreased Interest 0 0 0 0 0  Down, Depressed, Hopeless 0 0 0 0 0  PHQ - 2 Score  0 0 0 0 0     Fall Risk  08/10/2019 08/05/2019 07/28/2019 03/22/2019 01/21/2019  Falls in the past year? 0 0 0 0 0  Number falls in past yr: 0 0 0 - 0  Injury with Fall? 0 0 0 - 0  Follow up Falls evaluation completed;Education provided;Falls prevention discussed Falls evaluation completed Falls evaluation completed - Falls evaluation completed      PHYSICAL EXAM: BP 127/80 Comment: per patient  Ht 6' 1"  (1.854 m)   Wt 268 lb (121.6 kg) Comment: taken from previous visit  BMI 35.36 kg/m    Wt Readings from Last 3 Encounters:  08/10/19 268 lb (121.6 kg)  08/05/19 268 lb 6.4 oz (121.7 kg)  07/28/19 267 lb 6.4 oz (121.3 kg)   Medicare annual wellness visit, subsequent    Education/Counseling provided regarding diet and exercise, prevention of chronic diseases, smoking/tobacco cessation, if applicable, and reviewed "Covered Medicare Preventive Services."

## 2019-08-11 DIAGNOSIS — N179 Acute kidney failure, unspecified: Secondary | ICD-10-CM | POA: Diagnosis not present

## 2019-08-11 DIAGNOSIS — M1990 Unspecified osteoarthritis, unspecified site: Secondary | ICD-10-CM | POA: Diagnosis not present

## 2019-08-11 DIAGNOSIS — I10 Essential (primary) hypertension: Secondary | ICD-10-CM | POA: Diagnosis not present

## 2019-08-11 DIAGNOSIS — E119 Type 2 diabetes mellitus without complications: Secondary | ICD-10-CM | POA: Diagnosis not present

## 2019-08-11 DIAGNOSIS — G9341 Metabolic encephalopathy: Secondary | ICD-10-CM | POA: Diagnosis not present

## 2019-08-11 DIAGNOSIS — K219 Gastro-esophageal reflux disease without esophagitis: Secondary | ICD-10-CM | POA: Diagnosis not present

## 2019-08-12 ENCOUNTER — Ambulatory Visit: Payer: Medicare Other | Admitting: Family Medicine

## 2019-08-12 ENCOUNTER — Telehealth: Payer: Self-pay | Admitting: Family Medicine

## 2019-08-12 NOTE — Telephone Encounter (Signed)
Wife Vickii Chafe called concerned that pt states Dr Nyoka Cowden advised him to do 25 units of insulin at bedtime. She does not see anything on his paperwork that says for him to do that. But he has been doing the 25 units, and she states sometimes he forgets things, so she wants to make sure that is what the dr ordered.

## 2019-08-15 ENCOUNTER — Encounter: Payer: Self-pay | Admitting: Radiology

## 2019-08-15 DIAGNOSIS — N179 Acute kidney failure, unspecified: Secondary | ICD-10-CM | POA: Diagnosis not present

## 2019-08-15 DIAGNOSIS — G9341 Metabolic encephalopathy: Secondary | ICD-10-CM | POA: Diagnosis not present

## 2019-08-15 DIAGNOSIS — I10 Essential (primary) hypertension: Secondary | ICD-10-CM | POA: Diagnosis not present

## 2019-08-15 DIAGNOSIS — K219 Gastro-esophageal reflux disease without esophagitis: Secondary | ICD-10-CM | POA: Diagnosis not present

## 2019-08-15 DIAGNOSIS — E119 Type 2 diabetes mellitus without complications: Secondary | ICD-10-CM | POA: Diagnosis not present

## 2019-08-15 DIAGNOSIS — M1990 Unspecified osteoarthritis, unspecified site: Secondary | ICD-10-CM | POA: Diagnosis not present

## 2019-08-17 ENCOUNTER — Other Ambulatory Visit: Payer: Self-pay

## 2019-08-17 ENCOUNTER — Ambulatory Visit (INDEPENDENT_AMBULATORY_CARE_PROVIDER_SITE_OTHER): Payer: Medicare Other | Admitting: Podiatry

## 2019-08-17 DIAGNOSIS — B351 Tinea unguium: Secondary | ICD-10-CM | POA: Diagnosis not present

## 2019-08-17 DIAGNOSIS — M79674 Pain in right toe(s): Secondary | ICD-10-CM

## 2019-08-17 DIAGNOSIS — M79675 Pain in left toe(s): Secondary | ICD-10-CM | POA: Diagnosis not present

## 2019-08-17 NOTE — Patient Instructions (Signed)
Diabetes Mellitus and Foot Care Foot care is an important part of your health, especially when you have diabetes. Diabetes may cause you to have problems because of poor blood flow (circulation) to your feet and legs, which can cause your skin to:  Become thinner and drier.  Break more easily.  Heal more slowly.  Peel and crack. You may also have nerve damage (neuropathy) in your legs and feet, causing decreased feeling in them. This means that you may not notice minor injuries to your feet that could lead to more serious problems. Noticing and addressing any potential problems early is the best way to prevent future foot problems. How to care for your feet Foot hygiene  Wash your feet daily with warm water and mild soap. Do not use hot water. Then, pat your feet and the areas between your toes until they are completely dry. Do not soak your feet as this can dry your skin.  Trim your toenails straight across. Do not dig under them or around the cuticle. File the edges of your nails with an emery board or nail file.  Apply a moisturizing lotion or petroleum jelly to the skin on your feet and to dry, brittle toenails. Use lotion that does not contain alcohol and is unscented. Do not apply lotion between your toes. Shoes and socks  Wear clean socks or stockings every day. Make sure they are not too tight. Do not wear knee-high stockings since they may decrease blood flow to your legs.  Wear shoes that fit properly and have enough cushioning. Always look in your shoes before you put them on to be sure there are no objects inside.  To break in new shoes, wear them for just a few hours a day. This prevents injuries on your feet. Wounds, scrapes, corns, and calluses  Check your feet daily for blisters, cuts, bruises, sores, and redness. If you cannot see the bottom of your feet, use a mirror or ask someone for help.  Do not cut corns or calluses or try to remove them with medicine.  If you  find a minor scrape, cut, or break in the skin on your feet, keep it and the skin around it clean and dry. You may clean these areas with mild soap and water. Do not clean the area with peroxide, alcohol, or iodine.  If you have a wound, scrape, corn, or callus on your foot, look at it several times a day to make sure it is healing and not infected. Check for: ? Redness, swelling, or pain. ? Fluid or blood. ? Warmth. ? Pus or a bad smell. General instructions  Do not cross your legs. This may decrease blood flow to your feet.  Do not use heating pads or hot water bottles on your feet. They may burn your skin. If you have lost feeling in your feet or legs, you may not know this is happening until it is too late.  Protect your feet from hot and cold by wearing shoes, such as at the beach or on hot pavement.  Schedule a complete foot exam at least once a year (annually) or more often if you have foot problems. If you have foot problems, report any cuts, sores, or bruises to your health care provider immediately. Contact a health care provider if:  You have a medical condition that increases your risk of infection and you have any cuts, sores, or bruises on your feet.  You have an injury that is not   healing.  You have redness on your legs or feet.  You feel burning or tingling in your legs or feet.  You have pain or cramps in your legs and feet.  Your legs or feet are numb.  Your feet always feel cold.  You have pain around a toenail. Get help right away if:  You have a wound, scrape, corn, or callus on your foot and: ? You have pain, swelling, or redness that gets worse. ? You have fluid or blood coming from the wound, scrape, corn, or callus. ? Your wound, scrape, corn, or callus feels warm to the touch. ? You have pus or a bad smell coming from the wound, scrape, corn, or callus. ? You have a fever. ? You have a red line going up your leg. Summary  Check your feet every day  for cuts, sores, red spots, swelling, and blisters.  Moisturize feet and legs daily.  Wear shoes that fit properly and have enough cushioning.  If you have foot problems, report any cuts, sores, or bruises to your health care provider immediately.  Schedule a complete foot exam at least once a year (annually) or more often if you have foot problems. This information is not intended to replace advice given to you by your health care provider. Make sure you discuss any questions you have with your health care provider. Document Released: 10/17/2000 Document Revised: 12/02/2017 Document Reviewed: 11/21/2016 Elsevier Patient Education  2020 [employer]  

## 2019-08-18 DIAGNOSIS — E119 Type 2 diabetes mellitus without complications: Secondary | ICD-10-CM | POA: Diagnosis not present

## 2019-08-18 DIAGNOSIS — I10 Essential (primary) hypertension: Secondary | ICD-10-CM | POA: Diagnosis not present

## 2019-08-18 DIAGNOSIS — M1990 Unspecified osteoarthritis, unspecified site: Secondary | ICD-10-CM | POA: Diagnosis not present

## 2019-08-18 DIAGNOSIS — K219 Gastro-esophageal reflux disease without esophagitis: Secondary | ICD-10-CM | POA: Diagnosis not present

## 2019-08-18 DIAGNOSIS — N179 Acute kidney failure, unspecified: Secondary | ICD-10-CM | POA: Diagnosis not present

## 2019-08-18 DIAGNOSIS — G9341 Metabolic encephalopathy: Secondary | ICD-10-CM | POA: Diagnosis not present

## 2019-08-18 NOTE — Telephone Encounter (Signed)
lantus 20 units ok until follow up visit as improving at last visit. Thanks.

## 2019-08-18 NOTE — Telephone Encounter (Signed)
Hi Dr. [name],   Mr. Stanczak's wife wants to clarify the amount of insulin he should be taking at bedtime.  He thought it was 25 units.  It was prescribed as 20 units and I don't see anything in the last OV note to indicate an increase.  Please confirm and I can reach out to Mr. and Mrs. Mascorro.   Thank you,  Wilfred Curtis

## 2019-08-19 NOTE — Telephone Encounter (Signed)
Spoke with Mrs. Essman and clarified that bedtime does of Lantus is 20 units.  Wife verbalized understanding.

## 2019-08-21 ENCOUNTER — Encounter: Payer: Self-pay | Admitting: Podiatry

## 2019-08-21 NOTE — Progress Notes (Signed)
Subjective:  Edward Briggs presents to clinic today with cc of  painful, thick, discolored, elongated toenails 1-5 b/l that become tender and cannot cut because of thickness. Pain is aggravated when wearing enclosed shoe gear.  [name], MD is his PCP.   Current Outpatient Medications on File Prior to Visit  Medication Sig Dispense Refill  . Ascorbic Acid (VITAMIN C PO) Take 1 tablet by mouth daily.     [name] aspirin 81 MG tablet Take 81 mg by mouth daily.    . blood glucose meter kit and supplies Dispense based on patient and insurance preference. Use up to four times daily as directed. (FOR ICD-10 E10.9, E11.9). 1 each 0  . Blood Glucose Monitoring Suppl (BLOOD GLUCOSE METER) kit Use as instructed 1 each 0  . glucose blood (ACCU-CHEK AVIVA) test strip Use as instructed-  2-3 time a day 100 each 12  . Insulin Glargine (LANTUS SOLOSTAR) 100 UNIT/ML Solostar Pen Inject 20 Units into the skin at bedtime.    . Insulin Pen Needle (PEN NEEDLES) 32G X 4 MM MISC 1 application by Does not apply route daily. BD nano ultrafine 100 each 2  . lisinopril (ZESTRIL) 5 MG tablet Take 0.5 tablets (2.5 mg total) by mouth daily. 45 tablet 1  . metFORMIN (GLUCOPHAGE) 850 MG tablet Take 1 tablet (850 mg total) by mouth 2 (two) times daily with a meal. 180 tablet 1   No current facility-administered medications on file prior to visit.      No Known Allergies   Objective: There were no vitals filed for this visit.  Physical Examination:  Vascular Examination: Capillary refill time immediate x 10 digits.  DP pulses palpable b/l.  PT pulses faintly palpable b/l.  Digital hair decreased b/l.  No edema noted b/l.  Skin temperature gradient WNL b/l.  Dermatological Examination: Skin with normal turgor, texture and tone b/l.  No open wounds b/l.  No interdigital macerations noted b/l.  Elongated, thick, discolored brittle toenails with subungual debris and pain on dorsal palpation of  nailbeds 1-5 b/l.  Musculoskeletal Examination: Muscle strength 5/5 to all muscle groups b/l.  No pain, crepitus or joint discomfort with active/passive ROM.  Neurological Examination: Sensation intact 5/5 b/l with 10 gram monofilament.  Vibratory sensation decreased b/l.  Assessment: Mycotic nail infection with pain 1-5 b/l  Plan: 1. Toenails 1-5 b/l were debrided in length and girth without iatrogenic laceration. 2.  Continue soft, supportive shoe gear daily. 3.  Report any pedal injuries to medical professional. 4.  Follow up 3 months. 5.  Patient/POA to call should there be a question/concern in there interim.

## 2019-08-24 DIAGNOSIS — G9341 Metabolic encephalopathy: Secondary | ICD-10-CM | POA: Diagnosis not present

## 2019-08-24 DIAGNOSIS — M1990 Unspecified osteoarthritis, unspecified site: Secondary | ICD-10-CM | POA: Diagnosis not present

## 2019-08-24 DIAGNOSIS — N179 Acute kidney failure, unspecified: Secondary | ICD-10-CM | POA: Diagnosis not present

## 2019-08-24 DIAGNOSIS — I10 Essential (primary) hypertension: Secondary | ICD-10-CM | POA: Diagnosis not present

## 2019-08-24 DIAGNOSIS — K219 Gastro-esophageal reflux disease without esophagitis: Secondary | ICD-10-CM | POA: Diagnosis not present

## 2019-08-24 DIAGNOSIS — E119 Type 2 diabetes mellitus without complications: Secondary | ICD-10-CM | POA: Diagnosis not present

## 2019-08-25 ENCOUNTER — Ambulatory Visit: Payer: Medicare Other | Admitting: Family Medicine

## 2019-08-29 ENCOUNTER — Ambulatory Visit: Payer: Medicare Other | Admitting: Family Medicine

## 2019-08-30 DIAGNOSIS — E119 Type 2 diabetes mellitus without complications: Secondary | ICD-10-CM | POA: Diagnosis not present

## 2019-08-30 DIAGNOSIS — G9341 Metabolic encephalopathy: Secondary | ICD-10-CM | POA: Diagnosis not present

## 2019-08-30 DIAGNOSIS — M1990 Unspecified osteoarthritis, unspecified site: Secondary | ICD-10-CM | POA: Diagnosis not present

## 2019-08-30 DIAGNOSIS — N179 Acute kidney failure, unspecified: Secondary | ICD-10-CM | POA: Diagnosis not present

## 2019-08-30 DIAGNOSIS — I10 Essential (primary) hypertension: Secondary | ICD-10-CM | POA: Diagnosis not present

## 2019-08-30 DIAGNOSIS — K219 Gastro-esophageal reflux disease without esophagitis: Secondary | ICD-10-CM | POA: Diagnosis not present

## 2019-08-31 ENCOUNTER — Encounter: Payer: Self-pay | Admitting: Family Medicine

## 2019-08-31 ENCOUNTER — Ambulatory Visit (INDEPENDENT_AMBULATORY_CARE_PROVIDER_SITE_OTHER): Payer: Medicare Other | Admitting: Family Medicine

## 2019-08-31 ENCOUNTER — Other Ambulatory Visit: Payer: Self-pay

## 2019-08-31 VITALS — BP 147/88 | HR 85 | Temp 98.6°F | Wt 273.2 lb

## 2019-08-31 DIAGNOSIS — R972 Elevated prostate specific antigen [PSA]: Secondary | ICD-10-CM | POA: Diagnosis not present

## 2019-08-31 DIAGNOSIS — N419 Inflammatory disease of prostate, unspecified: Secondary | ICD-10-CM | POA: Diagnosis not present

## 2019-08-31 DIAGNOSIS — Z7982 Long term (current) use of aspirin: Secondary | ICD-10-CM | POA: Diagnosis not present

## 2019-08-31 DIAGNOSIS — Z87440 Personal history of urinary (tract) infections: Secondary | ICD-10-CM | POA: Diagnosis not present

## 2019-08-31 DIAGNOSIS — M545 Low back pain, unspecified: Secondary | ICD-10-CM

## 2019-08-31 DIAGNOSIS — R350 Frequency of micturition: Secondary | ICD-10-CM | POA: Diagnosis not present

## 2019-08-31 DIAGNOSIS — E119 Type 2 diabetes mellitus without complications: Secondary | ICD-10-CM | POA: Diagnosis not present

## 2019-08-31 DIAGNOSIS — K219 Gastro-esophageal reflux disease without esophagitis: Secondary | ICD-10-CM | POA: Diagnosis not present

## 2019-08-31 DIAGNOSIS — N179 Acute kidney failure, unspecified: Secondary | ICD-10-CM | POA: Diagnosis not present

## 2019-08-31 DIAGNOSIS — Z87891 Personal history of nicotine dependence: Secondary | ICD-10-CM | POA: Diagnosis not present

## 2019-08-31 DIAGNOSIS — M1990 Unspecified osteoarthritis, unspecified site: Secondary | ICD-10-CM | POA: Diagnosis not present

## 2019-08-31 DIAGNOSIS — E1165 Type 2 diabetes mellitus with hyperglycemia: Secondary | ICD-10-CM | POA: Diagnosis not present

## 2019-08-31 DIAGNOSIS — G9341 Metabolic encephalopathy: Secondary | ICD-10-CM | POA: Diagnosis not present

## 2019-08-31 DIAGNOSIS — Z87311 Personal history of (healed) other pathological fracture: Secondary | ICD-10-CM | POA: Diagnosis not present

## 2019-08-31 DIAGNOSIS — I10 Essential (primary) hypertension: Secondary | ICD-10-CM

## 2019-08-31 DIAGNOSIS — Z9181 History of falling: Secondary | ICD-10-CM | POA: Diagnosis not present

## 2019-08-31 DIAGNOSIS — Z794 Long term (current) use of insulin: Secondary | ICD-10-CM | POA: Diagnosis not present

## 2019-08-31 LAB — POCT URINALYSIS DIP (MANUAL ENTRY)
Bilirubin, UA: NEGATIVE
Blood, UA: NEGATIVE
Glucose, UA: NEGATIVE mg/dL
Ketones, POC UA: NEGATIVE mg/dL
Leukocytes, UA: NEGATIVE
Nitrite, UA: NEGATIVE
Protein Ur, POC: NEGATIVE mg/dL
Spec Grav, UA: 1.02 (ref 1.010–1.025)
Urobilinogen, UA: 0.2 E.U./dL
pH, UA: 6 (ref 5.0–8.0)

## 2019-08-31 NOTE — Progress Notes (Signed)
Subjective:    Patient ID: Edward Briggs, male    DOB: 1951/04/18, 68 y.o.   MRN: [id number]  HPI Edward Briggs is a 68 y.o. male Presents today for: Chief Complaint  Patient presents with  . Hypertension     3week f/u  . Back Pain    lower center spin pain that been going on for 3-4 week. with post UTI in Sept   UTI/sepsis, probable prostatitis.: See prior visits.  Improving symptoms last visit October 2, plan to complete antibiotics with RTC precautions if recurrent symptoms.  PSA improved from 5.1 on September 20 4th-1.8 October 2.  Looks like his usual range was 0.3 in June 2018 and February of this year.  Deferred further antibiotics as he had improved at that time.  Plan for urology follow-up as outpatient. No new burning/dysruia. No retention, normal stream.  Some low back soreness since hospital. Some improvement recently. No treatments.  No bowel or bladder incontinence, no saddle anesthesia, no lower extremity weakness.  Has home PT in place.    Hyperglycemia, diabetes: Currently on Metformin 850 mg twice daily.  He is on ACE inhibitor with lisinopril.  Glucose 119 at last visit, thought to have some hyperglycemia due to stress/infection previously.  Advised to return with home readings today. Home readings: 119, 158, 178. No 200's. Lowest 119, no symptomatic lows.  No regular missed doses of metformin 888m BID., lantus 20 units at bedtime.  Apple cider vinegar.   Lab Results  Component Value Date   HGBA1C 9.4 (H) 07/28/2019     Hypertension: BP Readings from Last 3 Encounters:  08/31/19 (!) 147/88  08/10/19 127/80  08/05/19 (!) 143/90   Lab Results  Component Value Date   CREATININE 1.00 07/28/2019  Increasing blood pressure prior visit, restarted lisinopril at 2.5 mg daily with home blood pressure monitoring.  Slowly restarted given prior AKI (peak creatinine during hospitalization 1.53.  Baseline creatinine 0.82-1.0. Taking 1/2 pill of lisinopril once  per day.  Home BP: 140/80's.        Patient Active Problem List   Diagnosis Date Noted  . HTN (hypertension) 07/22/2019  . Acute metabolic encephalopathy 038/45/3646 . AKI (acute kidney injury) ([facility] 07/22/2019  . Sepsis ([facility] 07/22/2019  . UTI (urinary tract infection) 07/22/2019  . Diabetes mellitus without complication ([facility] 180/32/1224 . CHEST PAIN 07/21/2009   Past Medical History:  Diagnosis Date  . Diabetes mellitus without complication ([facility]   . GERD (gastroesophageal reflux disease)    no meds   Past Surgical History:  Procedure Laterality Date  . ABSCESS DRAINAGE    . FRACTURE SURGERY    . HARDWARE REMOVAL  03/26/2012   Procedure: HARDWARE REMOVAL;  Surgeon: PColin Rhein MD;  [facility]  Service: Orthopedics;  Laterality: Left;  hardware removal deep left ankle syndesmotic screws only and stress xrays ankle   . ORIF ANKLE FRACTURE  12/03/2011   Procedure: OPEN REDUCTION INTERNAL FIXATION (ORIF) ANKLE FRACTURE;  Surgeon: PColin Rhein MD;  [facility]  Service: Orthopedics;  Laterality: Left;  left medial malleolus fracture  . PILONIDAL CYST EXCISION  ~1982  . SKIN GRAFT  ~1970   Lt great toe (graft from Lt thigh)   No Known Allergies Prior to Admission medications   Medication Sig Start Date End Date Taking? Authorizing Provider  Ascorbic Acid (VITAMIN C PO) Take 1 tablet by mouth daily.    Yes [provider]  aspirin 81 MG tablet Take 81 mg by mouth daily.   Yes [provider]  blood glucose meter kit and supplies Dispense based on patient and insurance preference. Use up to four times daily as directed. (FOR ICD-10 E10.9, E11.9). 07/25/19  Yes [name], MD  Blood Glucose Monitoring Suppl (BLOOD GLUCOSE METER) kit Use as instructed 09/10/12  Yes [name], MD  glucose blood (ACCU-CHEK AVIVA) test strip Use as instructed-  2-3 time a day 04/21/19  Yes [name], MD   Insulin Glargine (LANTUS SOLOSTAR) 100 UNIT/ML Solostar Pen Inject 20 Units into the skin at bedtime. 07/25/19  Yes [name], MD  Insulin Pen Needle (PEN NEEDLES) 32G X 4 MM MISC 1 application by Does not apply route daily. BD nano ultrafine 07/29/19  Yes [name], MD  lisinopril (ZESTRIL) 5 MG tablet Take 0.5 tablets (2.5 mg total) by mouth daily. 08/05/19  Yes [name], MD  metFORMIN (GLUCOPHAGE) 850 MG tablet Take 1 tablet (850 mg total) by mouth 2 (two) times daily with a meal. 03/22/19  Yes [name], MD   Social History   Socioeconomic History  . Marital status: Married    Spouse name: Not on file  . Number of children: 2  . Years of education: Not on file  . Highest education level: Not on file  Occupational History  . Not on file  Social Needs  . Financial resource strain: Not on file  . Food insecurity    Worry: Not on file    Inability: Not on file  . Transportation needs    Medical: Not on file    Non-medical: Not on file  Tobacco Use  . Smoking status: Former Smoker    Quit date: 10/30/1988    Years since quitting: 30.8  . Smokeless tobacco: Never Used  Substance and Sexual Activity  . Alcohol use: Yes    Alcohol/week: 1.0 standard drinks    Types: 1 Shots of liquor per week    Comment: every two weeks  . Drug use: No  . Sexual activity: Yes    Birth control/protection: None  Lifestyle  . Physical activity    Days per week: Not on file    Minutes per session: Not on file  . Stress: Not on file  Relationships  . Social Herbalist on phone: Not on file    Gets together: Not on file    Attends religious service: Not on file    Active member of club or organization: Not on file    Attends meetings of clubs or organizations: Not on file    Relationship status: Not on file  . Intimate partner violence    Fear of current or ex partner: Not on file    Emotionally abused: Not on file    Physically abused: Not on file     Forced sexual activity: Not on file  Other Topics Concern  . Not on file  Social History Narrative  . Not on file    Review of Systems Per HPI.     Objective:   Physical Exam Vitals signs reviewed.  Constitutional:      Appearance: He is well-developed.  HENT:     Head: Normocephalic and atraumatic.  Eyes:     Pupils: Pupils are equal, round, and reactive to light.  Neck:     Vascular: No carotid bruit or JVD.  Cardiovascular:     Rate and Rhythm:  Normal rate and regular rhythm.     Heart sounds: Normal heart sounds. No murmur.  Pulmonary:     Effort: Pulmonary effort is normal.     Breath sounds: Normal breath sounds. No rales.  Musculoskeletal:     Lumbar back: Tenderness: Minimal lower mid lumbar spine, paraspinals.  Negative seated straight leg raise.  Ambulating without assistance.  Skin:    General: Skin is warm and dry.  Neurological:     Mental Status: He is alert and oriented to person, place, and time.    Vitals:   08/31/19 1147 08/31/19 1149  BP: (!) 151/89 (!) 147/88  Pulse: 85   Temp: 98.6 F (37 C)   TempSrc: Oral   SpO2: 98%   Weight: 273 lb 3.2 oz (123.9 kg)       Results for orders placed or performed in visit on 08/31/19  POCT urinalysis dipstick  Result Value Ref Range   Color, UA yellow yellow   Clarity, UA clear clear   Glucose, UA negative negative mg/dL   Bilirubin, UA negative negative   Ketones, POC UA negative negative mg/dL   Spec Grav, UA 1.020 1.010 - 1.025   Blood, UA negative negative   pH, UA 6.0 5.0 - 8.0   Protein Ur, POC negative negative mg/dL   Urobilinogen, UA 0.2 0.2 or 1.0 E.U./dL   Nitrite, UA Negative Negative   Leukocytes, UA Negative Negative       Assessment & Plan:  JHOVANI GRISWOLD is a 68 y.o. male Acute midline low back pain without sciatica - Plan: POCT urinalysis dipstick  -Appears to be more musculoskeletal, likely component of degenerative disc disease some deconditioning.  Currently involved in  physical therapy.  Some improvement.  Tylenol discussed over-the-counter, follow-up next few weeks, consider imaging at that time if persistent, sooner if worse.  Elevated PSA - Plan: PSA Prostatitis, unspecified prostatitis type  -Denies new urinary symptoms.  Previous PSA downtrending and normalized but not quite at his base level.  Repeat testing today to verify he has returned to previous baseline, consider further antibiotics if increasing.  Urology eval planned for December.  Essential hypertension  -Decreased control, advised to take full lisinopril each day, monitor home readings, recheck 3 weeks.  Type 2 diabetes mellitus with hyperglycemia, with long-term current use of insulin (HCC)  -Decreased control, with some persistent elevated home readings.  Will increase Lantus by 2 units for now, recheck 3 weeks with home readings.  Continue metformin same dose for now.    No orders of the defined types were placed in this encounter.  Patient Instructions    Increase lisinopril to full pill per day.  Increase lantus to 22 units per day and recheck in next 3 weeks with home readings.  I will recheck the prostate test - if elevated, may need more antibiotics.  Low back pain likely arthritis. Tylenol is ok for now. May get xrays next time if not continuing to improve.   Return to the clinic or go to the nearest emergency room if any of your symptoms worsen or new symptoms occur.    If you have lab work done today you will be contacted with your lab results within the next 2 weeks.  If you have not heard from [place] then please contact us. The fastest way to get your results is to register for My Chart.   IF you received an x-ray today, you will receive an invoice from [facility]. Please contact  [facility] at [phone] with questions or concerns regarding your invoice.   IF you received labwork today, you will receive an invoice from [place]. Please contact [facility]  at [phone] with questions or concerns regarding your invoice.   Our billing staff will not be able to assist you with questions regarding bills from these companies.  You will be contacted with the lab results as soon as they are available. The fastest way to get your results is to activate your My Chart account. Instructions are located on the last page of this paperwork. If you have not heard from [place] regarding the results in 2 weeks, please contact this office.      Signed,   [name], MD [facility].  08/31/19 1:06 PM

## 2019-08-31 NOTE — Patient Instructions (Addendum)
  Increase lisinopril to full pill per day.  Increase lantus to 22 units per day and recheck in next 3 weeks with home readings.  I will recheck the prostate test - if elevated, may need more antibiotics.  Low back pain likely arthritis. Tylenol is ok for now. May get xrays next time if not continuing to improve.   Return to the clinic or go to the nearest emergency room if any of your symptoms worsen or new symptoms occur.    If you have lab work done today you will be contacted with your lab results within the next 2 weeks.  If you have not heard from [place] then please contact us. The fastest way to get your results is to register for My Chart.   IF you received an x-ray today, you will receive an invoice from [facility]. Please contact [facility] at [phone] with questions or concerns regarding your invoice.   IF you received labwork today, you will receive an invoice from [place]. Please contact [facility] at [phone] with questions or concerns regarding your invoice.   Our billing staff will not be able to assist you with questions regarding bills from these companies.  You will be contacted with the lab results as soon as they are available. The fastest way to get your results is to activate your My Chart account. Instructions are located on the last page of this paperwork. If you have not heard from [place] regarding the results in 2 weeks, please contact this office.

## 2019-09-01 LAB — PSA: Prostate Specific Ag, Serum: 1 ng/mL (ref 0.0–4.0)

## 2019-09-05 DIAGNOSIS — E119 Type 2 diabetes mellitus without complications: Secondary | ICD-10-CM | POA: Diagnosis not present

## 2019-09-05 DIAGNOSIS — K219 Gastro-esophageal reflux disease without esophagitis: Secondary | ICD-10-CM | POA: Diagnosis not present

## 2019-09-05 DIAGNOSIS — M1990 Unspecified osteoarthritis, unspecified site: Secondary | ICD-10-CM | POA: Diagnosis not present

## 2019-09-05 DIAGNOSIS — G9341 Metabolic encephalopathy: Secondary | ICD-10-CM | POA: Diagnosis not present

## 2019-09-05 DIAGNOSIS — N179 Acute kidney failure, unspecified: Secondary | ICD-10-CM | POA: Diagnosis not present

## 2019-09-05 DIAGNOSIS — I10 Essential (primary) hypertension: Secondary | ICD-10-CM | POA: Diagnosis not present

## 2019-09-08 DIAGNOSIS — K219 Gastro-esophageal reflux disease without esophagitis: Secondary | ICD-10-CM | POA: Diagnosis not present

## 2019-09-08 DIAGNOSIS — G9341 Metabolic encephalopathy: Secondary | ICD-10-CM | POA: Diagnosis not present

## 2019-09-08 DIAGNOSIS — E119 Type 2 diabetes mellitus without complications: Secondary | ICD-10-CM | POA: Diagnosis not present

## 2019-09-08 DIAGNOSIS — N179 Acute kidney failure, unspecified: Secondary | ICD-10-CM | POA: Diagnosis not present

## 2019-09-08 DIAGNOSIS — M1990 Unspecified osteoarthritis, unspecified site: Secondary | ICD-10-CM | POA: Diagnosis not present

## 2019-09-08 DIAGNOSIS — I10 Essential (primary) hypertension: Secondary | ICD-10-CM | POA: Diagnosis not present

## 2019-09-12 ENCOUNTER — Encounter: Payer: Self-pay | Admitting: Radiology

## 2019-09-14 DIAGNOSIS — M1990 Unspecified osteoarthritis, unspecified site: Secondary | ICD-10-CM | POA: Diagnosis not present

## 2019-09-14 DIAGNOSIS — E119 Type 2 diabetes mellitus without complications: Secondary | ICD-10-CM | POA: Diagnosis not present

## 2019-09-14 DIAGNOSIS — I10 Essential (primary) hypertension: Secondary | ICD-10-CM | POA: Diagnosis not present

## 2019-09-14 DIAGNOSIS — K219 Gastro-esophageal reflux disease without esophagitis: Secondary | ICD-10-CM | POA: Diagnosis not present

## 2019-09-14 DIAGNOSIS — G9341 Metabolic encephalopathy: Secondary | ICD-10-CM | POA: Diagnosis not present

## 2019-09-14 DIAGNOSIS — N179 Acute kidney failure, unspecified: Secondary | ICD-10-CM | POA: Diagnosis not present

## 2019-09-21 ENCOUNTER — Ambulatory Visit (INDEPENDENT_AMBULATORY_CARE_PROVIDER_SITE_OTHER): Payer: Medicare Other | Admitting: Family Medicine

## 2019-09-21 ENCOUNTER — Other Ambulatory Visit: Payer: Self-pay

## 2019-09-21 ENCOUNTER — Ambulatory Visit (INDEPENDENT_AMBULATORY_CARE_PROVIDER_SITE_OTHER): Payer: Medicare Other

## 2019-09-21 ENCOUNTER — Encounter: Payer: Self-pay | Admitting: Family Medicine

## 2019-09-21 VITALS — BP 143/88 | HR 98 | Temp 98.9°F | Resp 12 | Ht 73.0 in | Wt 269.0 lb

## 2019-09-21 DIAGNOSIS — G9341 Metabolic encephalopathy: Secondary | ICD-10-CM | POA: Diagnosis not present

## 2019-09-21 DIAGNOSIS — K219 Gastro-esophageal reflux disease without esophagitis: Secondary | ICD-10-CM | POA: Diagnosis not present

## 2019-09-21 DIAGNOSIS — M545 Low back pain, unspecified: Secondary | ICD-10-CM

## 2019-09-21 DIAGNOSIS — E1165 Type 2 diabetes mellitus with hyperglycemia: Secondary | ICD-10-CM

## 2019-09-21 DIAGNOSIS — N179 Acute kidney failure, unspecified: Secondary | ICD-10-CM | POA: Diagnosis not present

## 2019-09-21 DIAGNOSIS — I10 Essential (primary) hypertension: Secondary | ICD-10-CM

## 2019-09-21 DIAGNOSIS — E119 Type 2 diabetes mellitus without complications: Secondary | ICD-10-CM | POA: Diagnosis not present

## 2019-09-21 DIAGNOSIS — M1990 Unspecified osteoarthritis, unspecified site: Secondary | ICD-10-CM | POA: Diagnosis not present

## 2019-09-21 DIAGNOSIS — Z794 Long term (current) use of insulin: Secondary | ICD-10-CM

## 2019-09-21 NOTE — Progress Notes (Signed)
Subjective:  Patient ID: Edward Briggs, male    DOB: November 01, 1951  Age: 68 y.o. MRN: [id number]  CC:  Chief Complaint  Patient presents with  . Follow-up  . Back Pain    Pt stated ---still having low back pain/aching--escpecially when bending.  [name] Hypertension    pt  stated have not took his B/p meds today.     HPI Edward Briggs presents for   Low back pain Discussed in October 20 visit.  P to be more musculoskeletal, likely component of DDD with some deconditioning.  Continue physical therapy at that time with some improvement.  Tylenol over-the-counter discussed.  Still having some low back pain especially with bending or more with lifting,  No night sweats/fever/chills/hematuria/dysuria.  Slightly better. No meds taken.  No bowel or bladder incontinence, no saddle anesthesia, no lower extremity weakness.  Today is last day of PT, but not working on back - more endurance exercises.    Hypertension: Uncontrolled last visit, advised to take full lisinopril each day for dose of 5 mg daily.  Taking 59m lisinopril per day. Only 1 missed dose few days ago.  Home readings:136/89, 145/96, 118/69, 152/94, 133/85.  BP Readings from Last 3 Encounters:  09/21/19 (!) 143/88  08/31/19 (!) 147/88  08/10/19 127/80   Lab Results  Component Value Date   CREATININE 1.00 07/28/2019   Diabetes: Persistent elevated home readings at last visit October 28.  Lantus increased by 2 units to a dose of 22 units/day.  Continued on metformin 850 mg twice daily - initial diarrhea, better now  Home readings:fasting 137-200, PP 276.   No symptomatic lows.  Lab Results  Component Value Date   HGBA1C 9.4 (H) 07/28/2019   HGBA1C 8.6 (A) 03/22/2019   HGBA1C 13.2 (H) 12/07/2018   Lab Results  Component Value Date   MICROALBUR 0.7 02/20/2015   LDLCALC 77 12/07/2018   CREATININE 1.00 07/28/2019      History Patient Active Problem List   Diagnosis Date Noted  . HTN (hypertension) 07/22/2019  .  Acute metabolic encephalopathy 039/76/7341 . AKI (acute kidney injury) ([facility] 07/22/2019  . Sepsis ([facility] 07/22/2019  . UTI (urinary tract infection) 07/22/2019  . Diabetes mellitus without complication ([facility] 193/79/0240 . CHEST PAIN 07/21/2009   Past Medical History:  Diagnosis Date  . Diabetes mellitus without complication ([facility]   . GERD (gastroesophageal reflux disease)    no meds   Past Surgical History:  Procedure Laterality Date  . ABSCESS DRAINAGE    . FRACTURE SURGERY    . HARDWARE REMOVAL  03/26/2012   Procedure: HARDWARE REMOVAL;  Surgeon: PColin Rhein MD;  [facility]  Service: Orthopedics;  Laterality: Left;  hardware removal deep left ankle syndesmotic screws only and stress xrays ankle   . ORIF ANKLE FRACTURE  12/03/2011   Procedure: OPEN REDUCTION INTERNAL FIXATION (ORIF) ANKLE FRACTURE;  Surgeon: PColin Rhein MD;  [facility]  Service: Orthopedics;  Laterality: Left;  left medial malleolus fracture  . PILONIDAL CYST EXCISION  ~1982  . SKIN GRAFT  ~1970   Lt great toe (graft from Lt thigh)   No Known Allergies Prior to Admission medications   Medication Sig Start Date End Date Taking? Authorizing Provider  aspirin 81 MG tablet Take 81 mg by mouth daily.   Yes [provider]  blood glucose meter kit and supplies Dispense based on patient and insurance preference. Use up to four times  daily as directed. (FOR ICD-10 E10.9, E11.9). 07/25/19  Yes [name], MD  Blood Glucose Monitoring Suppl (BLOOD GLUCOSE METER) kit Use as instructed 09/10/12  Yes [name], MD  glucose blood (ACCU-CHEK AVIVA) test strip Use as instructed-  2-3 time a day 04/21/19  Yes [name], MD  Insulin Glargine (LANTUS SOLOSTAR) 100 UNIT/ML Solostar Pen Inject 20 Units into the skin at bedtime. 07/25/19  Yes [name], MD  Insulin Pen Needle (PEN NEEDLES) 32G X 4 MM MISC 1 application by Does not apply route daily.  BD nano ultrafine 07/29/19  Yes [name], MD  lisinopril (ZESTRIL) 5 MG tablet Take 0.5 tablets (2.5 mg total) by mouth daily. 08/05/19  Yes [name], MD  metFORMIN (GLUCOPHAGE) 850 MG tablet Take 1 tablet (850 mg total) by mouth 2 (two) times daily with a meal. 03/22/19  Yes [name], MD  Ascorbic Acid (VITAMIN C PO) Take 1 tablet by mouth daily.     [provider]   Social History   Socioeconomic History  . Marital status: Married    Spouse name: Not on file  . Number of children: 2  . Years of education: Not on file  . Highest education level: Not on file  Occupational History  . Not on file  Social Needs  . Financial resource strain: Not on file  . Food insecurity    Worry: Not on file    Inability: Not on file  . Transportation needs    Medical: Not on file    Non-medical: Not on file  Tobacco Use  . Smoking status: Former Smoker    Quit date: 10/30/1988    Years since quitting: 30.9  . Smokeless tobacco: Never Used  Substance and Sexual Activity  . Alcohol use: Yes    Alcohol/week: 1.0 standard drinks    Types: 1 Shots of liquor per week    Comment: every two weeks  . Drug use: No  . Sexual activity: Yes    Birth control/protection: None  Lifestyle  . Physical activity    Days per week: Not on file    Minutes per session: Not on file  . Stress: Not on file  Relationships  . Social Herbalist on phone: Not on file    Gets together: Not on file    Attends religious service: Not on file    Active member of club or organization: Not on file    Attends meetings of clubs or organizations: Not on file    Relationship status: Not on file  . Intimate partner violence    Fear of current or ex partner: Not on file    Emotionally abused: Not on file    Physically abused: Not on file    Forced sexual activity: Not on file  Other Topics Concern  . Not on file  Social History Narrative  . Not on file    Review of Systems   Constitutional: Negative for fatigue and unexpected weight change.  Eyes: Negative for visual disturbance.  Respiratory: Negative for cough, chest tightness and shortness of breath.   Cardiovascular: Negative for chest pain, palpitations and leg swelling.  Gastrointestinal: Negative for abdominal pain and blood in stool.  Neurological: Negative for dizziness, light-headedness and headaches.     Objective:   Vitals:   09/21/19 1103 09/21/19 1104  BP: (!) 152/80 (!) 143/88  Pulse: 98   Resp: 12   Temp: 98.9 F (  37.2 C)   SpO2: 99%   Weight: 269 lb (122 kg)   Height: 6' 1"  (1.854 m)      Physical Exam Vitals signs reviewed.  Constitutional:      Appearance: He is well-developed.  HENT:     Head: Normocephalic and atraumatic.  Eyes:     Pupils: Pupils are equal, round, and reactive to light.  Neck:     Vascular: No carotid bruit or JVD.  Cardiovascular:     Rate and Rhythm: Normal rate and regular rhythm.     Heart sounds: Normal heart sounds. No murmur.  Pulmonary:     Effort: Pulmonary effort is normal.     Breath sounds: Normal breath sounds. No rales.  Musculoskeletal:     Lumbar back: He exhibits tenderness. He exhibits normal range of motion, no bony tenderness, no swelling and no spasm.       Back:  Skin:    General: Skin is warm and dry.  Neurological:     Mental Status: He is alert and oriented to person, place, and time.     Dg Lumbar Spine Complete  Result Date: 09/21/2019 CLINICAL DATA:  68 year old male with low back pain. No known injury. EXAM: LUMBAR SPINE - COMPLETE 4+ VIEW COMPARISON:  None. FINDINGS: Five lumbar type vertebra. There is no acute fracture or subluxation of the lumbar spine. The visualized posterior elements are intact. Degenerative changes of the lower lumbar spine with facet arthropathy primarily at L4-L5 and L5 spine. The soft tissues are unremarkable. Mild atherosclerotic calcification of the aorta. IMPRESSION: 1. No  acute/traumatic lumbar spine pathology. 2. Degenerative changes of the lower lumbar spine. Electronically Signed   By: Anner Crete M.D.   On: 09/21/2019 12:12      Assessment & Plan:  Edward Briggs is a 68 y.o. male . Acute midline low back pain without sciatica - Plan: DG Lumbar Spine Complete  -Suspect mechanical pain with degenerative disc disease as above.  Tylenol over-the-counter discussed, range of motion, stretches, handout given on low back pain.  Consider physical therapy if not continuing to improve.  Recheck 1 month  Essential hypertension  -Borderline control, but has had some lower readings at home.  Decided to remain on lisinopril 5 mg daily, sodium avoidance discussed, handout given, recheck 1 month.  Type 2 diabetes mellitus with hyperglycemia, with long-term current use of insulin (HCC)  -Improving but still decreased control based on elevated fastings and postprandials.  Increase Lantus to 24 units/day, continue metformin same dose as some diarrhea initially at that dose but improved recently.  Hypoglycemic precautions and treatment plan discussed.  Recheck 1 month with A1c  No orders of the defined types were placed in this encounter.  Patient Instructions   Try tylenol for back pain and see info below. I will check xray and let you know if any concerns. Return to the clinic or go to the nearest emergency room if any of your symptoms worsen or new symptoms occur.   Continue lisinopril 68m per day, but see info on blood pressure below. Avoid added salt to food, frozen meals and restaurant food may also have high salt/sodium.   Try increasing lantus to 24 units per day. No change in metformin for now. Watch for low blood sugars, and let me know right away if those occur.   Recheck in 1 month.    Managing Your Hypertension Hypertension is commonly called high blood pressure. This is when the force of your  blood pressing against the walls of your arteries is too  strong. Arteries are blood vessels that carry blood from your heart throughout your body. Hypertension forces the heart to work harder to pump blood, and may cause the arteries to become narrow or stiff. Having untreated or uncontrolled hypertension can cause heart attack, stroke, kidney disease, and other problems. What are blood pressure readings? A blood pressure reading consists of a higher number over a lower number. Ideally, your blood pressure should be below 120/80. The first ("top") number is called the systolic pressure. It is a measure of the pressure in your arteries as your heart beats. The second ("bottom") number is called the diastolic pressure. It is a measure of the pressure in your arteries as the heart relaxes. What does my blood pressure reading mean? Blood pressure is classified into four stages. Based on your blood pressure reading, your health care provider may use the following stages to determine what type of treatment you need, if any. Systolic pressure and diastolic pressure are measured in a unit called mm Hg. Normal  Systolic pressure: below 654.  Diastolic pressure: below 80. Elevated  Systolic pressure: 650-354.  Diastolic pressure: below 80. Hypertension stage 1  Systolic pressure: 656-812.  Diastolic pressure: 75-17. Hypertension stage 2  Systolic pressure: 001 or above.  Diastolic pressure: 90 or above. What health risks are associated with hypertension? Managing your hypertension is an important responsibility. Uncontrolled hypertension can lead to:  A heart attack.  A stroke.  A weakened blood vessel (aneurysm).  Heart failure.  Kidney damage.  Eye damage.  Metabolic syndrome.  Memory and concentration problems. What changes can I make to manage my hypertension? Hypertension can be managed by making lifestyle changes and possibly by taking medicines. Your health care provider will help you make a plan to bring your blood pressure within  a normal range. Eating and drinking   Eat a diet that is high in fiber and potassium, and low in salt (sodium), added sugar, and fat. An example eating plan is called the DASH (Dietary Approaches to Stop Hypertension) diet. To eat this way: ? Eat plenty of fresh fruits and vegetables. Try to fill half of your plate at each meal with fruits and vegetables. ? Eat whole grains, such as whole wheat pasta, brown rice, or whole grain bread. Fill about one quarter of your plate with whole grains. ? Eat low-fat diary products. ? Avoid fatty cuts of meat, processed or cured meats, and poultry with skin. Fill about one quarter of your plate with lean proteins such as fish, chicken without skin, beans, eggs, and tofu. ? Avoid premade and processed foods. These tend to be higher in sodium, added sugar, and fat.  Reduce your daily sodium intake. Most people with hypertension should eat less than 1,500 mg of sodium a day.  Limit alcohol intake to no more than 1 drink a day for nonpregnant women and 2 drinks a day for men. One drink equals 12 oz of beer, 5 oz of wine, or 1 oz of hard liquor. Lifestyle  Work with your health care provider to maintain a healthy body weight, or to lose weight. Ask what an ideal weight is for you.  Get at least 30 minutes of exercise that causes your heart to beat faster (aerobic exercise) most days of the week. Activities may include walking, swimming, or biking.  Include exercise to strengthen your muscles (resistance exercise), such as weight lifting, as part of your weekly exercise  routine. Try to do these types of exercises for 30 minutes at least 3 days a week.  Do not use any products that contain nicotine or tobacco, such as cigarettes and e-cigarettes. If you need help quitting, ask your health care provider.  Control any long-term (chronic) conditions you have, such as high cholesterol or diabetes. Monitoring  Monitor your blood pressure at home as told by your  health care provider. Your personal target blood pressure may vary depending on your medical conditions, your age, and other factors.  Have your blood pressure checked regularly, as often as told by your health care provider. Working with your health care provider  Review all the medicines you take with your health care provider because there may be side effects or interactions.  Talk with your health care provider about your diet, exercise habits, and other lifestyle factors that may be contributing to hypertension.  Visit your health care provider regularly. Your health care provider can help you create and adjust your plan for managing hypertension. Will I need medicine to control my blood pressure? Your health care provider may prescribe medicine if lifestyle changes are not enough to get your blood pressure under control, and if:  Your systolic blood pressure is 130 or higher.  Your diastolic blood pressure is 80 or higher. Take medicines only as told by your health care provider. Follow the directions carefully. Blood pressure medicines must be taken as prescribed. The medicine does not work as well when you skip doses. Skipping doses also puts you at risk for problems. Contact a health care provider if:  You think you are having a reaction to medicines you have taken.  You have repeated (recurrent) headaches.  You feel dizzy.  You have swelling in your ankles.  You have trouble with your vision. Get help right away if:  You develop a severe headache or confusion.  You have unusual weakness or numbness, or you feel faint.  You have severe pain in your chest or abdomen.  You vomit repeatedly.  You have trouble breathing. Summary  Hypertension is when the force of blood pumping through your arteries is too strong. If this condition is not controlled, it may put you at risk for serious complications.  Your personal target blood pressure may vary depending on your medical  conditions, your age, and other factors. For most people, a normal blood pressure is less than 120/80.  Hypertension is managed by lifestyle changes, medicines, or both. Lifestyle changes include weight loss, eating a healthy, low-sodium diet, exercising more, and limiting alcohol. This information is not intended to replace advice given to you by your health care provider. Make sure you discuss any questions you have with your health care provider. Document Released: 07/14/2012 Document Revised: 02/11/2019 Document Reviewed: 09/17/2016 Elsevier Patient Education  [employer].   Acute Back Pain, Adult Acute back pain is sudden and usually short-lived. It is often caused by an injury to the muscles and tissues in the back. The injury may result from:  A muscle or ligament getting overstretched or torn (strained). Ligaments are tissues that connect bones to each other. Lifting something improperly can cause a back strain.  Wear and tear (degeneration) of the spinal disks. Spinal disks are circular tissue that provides cushioning between the bones of the spine (vertebrae).  Twisting motions, such as while playing sports or doing yard work.  A hit to the back.  Arthritis. You may have a physical exam, lab tests, and imaging tests  to find the cause of your pain. Acute back pain usually goes away with rest and home care. Follow these instructions at home: Managing pain, stiffness, and swelling  Take over-the-counter and prescription medicines only as told by your health care provider.  Your health care provider may recommend applying ice during the first 24-48 hours after your pain starts. To do this: ? Put ice in a plastic bag. ? Place a towel between your skin and the bag. ? Leave the ice on for 20 minutes, 2-3 times a day.  If directed, apply heat to the affected area as often as told by your health care provider. Use the heat source that your health care provider recommends, such  as a moist heat pack or a heating pad. ? Place a towel between your skin and the heat source. ? Leave the heat on for 20-30 minutes. ? Remove the heat if your skin turns bright red. This is especially important if you are unable to feel pain, heat, or cold. You have a greater risk of getting burned. Activity   Do not stay in bed. Staying in bed for more than 1-2 days can delay your recovery.  Sit up and stand up straight. Avoid leaning forward when you sit, or hunching over when you stand. ? If you work at a desk, sit close to it so you do not need to lean over. Keep your chin tucked in. Keep your neck drawn back, and keep your elbows bent at a right angle. Your arms should look like the letter "L." ? Sit high and close to the steering wheel when you drive. Add lower back (lumbar) support to your car seat, if needed.  Take short walks on even surfaces as soon as you are able. Try to increase the length of time you walk each day.  Do not sit, drive, or stand in one place for more than 30 minutes at a time. Sitting or standing for long periods of time can put stress on your back.  Do not drive or use heavy machinery while taking prescription pain medicine.  Use proper lifting techniques. When you bend and lift, use positions that put less stress on your back: ? Sinclair your knees. ? Keep the load close to your body. ? Avoid twisting.  Exercise regularly as told by your health care provider. Exercising helps your back heal faster and helps prevent back injuries by keeping muscles strong and flexible.  Work with a physical therapist to make a safe exercise program, as recommended by your health care provider. Do any exercises as told by your physical therapist. Lifestyle  Maintain a healthy weight. Extra weight puts stress on your back and makes it difficult to have good posture.  Avoid activities or situations that make you feel anxious or stressed. Stress and anxiety increase muscle tension  and can make back pain worse. Learn ways to manage anxiety and stress, such as through exercise. General instructions  Sleep on a firm mattress in a comfortable position. Try lying on your side with your knees slightly bent. If you lie on your back, put a pillow under your knees.  Follow your treatment plan as told by your health care provider. This may include: ? Cognitive or behavioral therapy. ? Acupuncture or massage therapy. ? Meditation or yoga. Contact a health care provider if:  You have pain that is not relieved with rest or medicine.  You have increasing pain going down into your legs or buttocks.  Your  pain does not improve after 2 weeks.  You have pain at night.  You lose weight without trying.  You have a fever or chills. Get help right away if:  You develop new bowel or bladder control problems.  You have unusual weakness or numbness in your arms or legs.  You develop nausea or vomiting.  You develop abdominal pain.  You feel faint. Summary  Acute back pain is sudden and usually short-lived.  Use proper lifting techniques. When you bend and lift, use positions that put less stress on your back.  Take over-the-counter and prescription medicines and apply heat or ice as directed by your health care provider. This information is not intended to replace advice given to you by your health care provider. Make sure you discuss any questions you have with your health care provider. Document Released: 10/20/2005 Document Revised: 02/08/2019 Document Reviewed: 06/03/2017 Elsevier Patient Education  [employer].      If you have lab work done today you will be contacted with your lab results within the next 2 weeks.  If you have not heard from [place] then please contact us. The fastest way to get your results is to register for My Chart.   IF you received an x-ray today, you will receive an invoice from [facility]. Please contact [facility]  at [phone] with questions or concerns regarding your invoice.   IF you received labwork today, you will receive an invoice from [facility]. Please contact [facility] at [phone] with questions or concerns regarding your invoice.   Our billing staff will not be able to assist you with questions regarding bills from these companies.  You will be contacted with the lab results as soon as they are available. The fastest way to get your results is to activate your My Chart account. Instructions are located on the last page of this paperwork. If you have not heard from [place] regarding the results in 2 weeks, please contact this office.          Signed, [name], MD [facility]

## 2019-09-21 NOTE — Patient Instructions (Signed)
Try tylenol for back pain and see info below. I will check xray and let you know if any concerns. Return to the clinic or go to the nearest emergency room if any of your symptoms worsen or new symptoms occur.   Continue lisinopril 5mg  per day, but see info on blood pressure below. Avoid added salt to food, frozen meals and restaurant food may also have high salt/sodium.   Try increasing lantus to 24 units per day. No change in metformin for now. Watch for low blood sugars, and let me know right away if those occur.   Recheck in 1 month.    Managing Your Hypertension Hypertension is commonly called high blood pressure. This is when the force of your blood pressing against the walls of your arteries is too strong. Arteries are blood vessels that carry blood from your heart throughout your body. Hypertension forces the heart to work harder to pump blood, and may cause the arteries to become narrow or stiff. Having untreated or uncontrolled hypertension can cause heart attack, stroke, kidney disease, and other problems. What are blood pressure readings? A blood pressure reading consists of a higher number over a lower number. Ideally, your blood pressure should be below 120/80. The first ("top") number is called the systolic pressure. It is a measure of the pressure in your arteries as your heart beats. The second ("bottom") number is called the diastolic pressure. It is a measure of the pressure in your arteries as the heart relaxes. What does my blood pressure reading mean? Blood pressure is classified into four stages. Based on your blood pressure reading, your health care provider may use the following stages to determine what type of treatment you need, if any. Systolic pressure and diastolic pressure are measured in a unit called mm Hg. Normal  Systolic pressure: below 123456.  Diastolic pressure: below 80. Elevated  Systolic pressure: [id number].  Diastolic pressure: below 80. Hypertension  stage 1  Systolic pressure: 0000000.  Diastolic pressure: [id number]. Hypertension stage 2  Systolic pressure: [id number] or above.  Diastolic pressure: 90 or above. What health risks are associated with hypertension? Managing your hypertension is an important responsibility. Uncontrolled hypertension can lead to:  A heart attack.  A stroke.  A weakened blood vessel (aneurysm).  Heart failure.  Kidney damage.  Eye damage.  Metabolic syndrome.  Memory and concentration problems. What changes can I make to manage my hypertension? Hypertension can be managed by making lifestyle changes and possibly by taking medicines. Your health care provider will help you make a plan to bring your blood pressure within a normal range. Eating and drinking   Eat a diet that is high in fiber and potassium, and low in salt (sodium), added sugar, and fat. An example eating plan is called the DASH (Dietary Approaches to Stop Hypertension) diet. To eat this way: ? Eat plenty of fresh fruits and vegetables. Try to fill half of your plate at each meal with fruits and vegetables. ? Eat whole grains, such as whole wheat pasta, brown rice, or whole grain bread. Fill about one quarter of your plate with whole grains. ? Eat low-fat diary products. ? Avoid fatty cuts of meat, processed or cured meats, and poultry with skin. Fill about one quarter of your plate with lean proteins such as fish, chicken without skin, beans, eggs, and tofu. ? Avoid premade and processed foods. These tend to be higher in sodium, added sugar, and fat.  Reduce your daily sodium intake. Most people  with hypertension should eat less than 1,500 mg of sodium a day.  Limit alcohol intake to no more than 1 drink a day for nonpregnant women and 2 drinks a day for men. One drink equals 12 oz of beer, 5 oz of wine, or 1 oz of hard liquor. Lifestyle  Work with your health care provider to maintain a healthy body weight, or to lose weight. Ask what  an ideal weight is for you.  Get at least 30 minutes of exercise that causes your heart to beat faster (aerobic exercise) most days of the week. Activities may include walking, swimming, or biking.  Include exercise to strengthen your muscles (resistance exercise), such as weight lifting, as part of your weekly exercise routine. Try to do these types of exercises for 30 minutes at least 3 days a week.  Do not use any products that contain nicotine or tobacco, such as cigarettes and e-cigarettes. If you need help quitting, ask your health care provider.  Control any long-term (chronic) conditions you have, such as high cholesterol or diabetes. Monitoring  Monitor your blood pressure at home as told by your health care provider. Your personal target blood pressure may vary depending on your medical conditions, your age, and other factors.  Have your blood pressure checked regularly, as often as told by your health care provider. Working with your health care provider  Review all the medicines you take with your health care provider because there may be side effects or interactions.  Talk with your health care provider about your diet, exercise habits, and other lifestyle factors that may be contributing to hypertension.  Visit your health care provider regularly. Your health care provider can help you create and adjust your plan for managing hypertension. Will I need medicine to control my blood pressure? Your health care provider may prescribe medicine if lifestyle changes are not enough to get your blood pressure under control, and if:  Your systolic blood pressure is 130 or higher.  Your diastolic blood pressure is 80 or higher. Take medicines only as told by your health care provider. Follow the directions carefully. Blood pressure medicines must be taken as prescribed. The medicine does not work as well when you skip doses. Skipping doses also puts you at risk for problems. Contact a  health care provider if:  You think you are having a reaction to medicines you have taken.  You have repeated (recurrent) headaches.  You feel dizzy.  You have swelling in your ankles.  You have trouble with your vision. Get help right away if:  You develop a severe headache or confusion.  You have unusual weakness or numbness, or you feel faint.  You have severe pain in your chest or abdomen.  You vomit repeatedly.  You have trouble breathing. Summary  Hypertension is when the force of blood pumping through your arteries is too strong. If this condition is not controlled, it may put you at risk for serious complications.  Your personal target blood pressure may vary depending on your medical conditions, your age, and other factors. For most people, a normal blood pressure is less than 120/80.  Hypertension is managed by lifestyle changes, medicines, or both. Lifestyle changes include weight loss, eating a healthy, low-sodium diet, exercising more, and limiting alcohol. This information is not intended to replace advice given to you by your health care provider. Make sure you discuss any questions you have with your health care provider. Document Released: 07/14/2012 Document Revised: 02/11/2019 Document  Reviewed: 09/17/2016 Elsevier Patient Education  [place].   Acute Back Pain, Adult Acute back pain is sudden and usually short-lived. It is often caused by an injury to the muscles and tissues in the back. The injury may result from:  A muscle or ligament getting overstretched or torn (strained). Ligaments are tissues that connect bones to each other. Lifting something improperly can cause a back strain.  Wear and tear (degeneration) of the spinal disks. Spinal disks are circular tissue that provides cushioning between the bones of the spine (vertebrae).  Twisting motions, such as while playing sports or doing yard work.  A hit to the back.  Arthritis. You may  have a physical exam, lab tests, and imaging tests to find the cause of your pain. Acute back pain usually goes away with rest and home care. Follow these instructions at home: Managing pain, stiffness, and swelling  Take over-the-counter and prescription medicines only as told by your health care provider.  Your health care provider may recommend applying ice during the first 24-48 hours after your pain starts. To do this: ? Put ice in a plastic bag. ? Place a towel between your skin and the bag. ? Leave the ice on for 20 minutes, 2-3 times a day.  If directed, apply heat to the affected area as often as told by your health care provider. Use the heat source that your health care provider recommends, such as a moist heat pack or a heating pad. ? Place a towel between your skin and the heat source. ? Leave the heat on for 20-30 minutes. ? Remove the heat if your skin turns bright red. This is especially important if you are unable to feel pain, heat, or cold. You have a greater risk of getting burned. Activity   Do not stay in bed. Staying in bed for more than 1-2 days can delay your recovery.  Sit up and stand up straight. Avoid leaning forward when you sit, or hunching over when you stand. ? If you work at a desk, sit close to it so you do not need to lean over. Keep your chin tucked in. Keep your neck drawn back, and keep your elbows bent at a right angle. Your arms should look like the letter "L." ? Sit high and close to the steering wheel when you drive. Add lower back (lumbar) support to your car seat, if needed.  Take short walks on even surfaces as soon as you are able. Try to increase the length of time you walk each day.  Do not sit, drive, or stand in one place for more than 30 minutes at a time. Sitting or standing for long periods of time can put stress on your back.  Do not drive or use heavy machinery while taking prescription pain medicine.  Use proper lifting  techniques. When you bend and lift, use positions that put less stress on your back: ? [place] your knees. ? Keep the load close to your body. ? Avoid twisting.  Exercise regularly as told by your health care provider. Exercising helps your back heal faster and helps prevent back injuries by keeping muscles strong and flexible.  Work with a physical therapist to make a safe exercise program, as recommended by your health care provider. Do any exercises as told by your physical therapist. Lifestyle  Maintain a healthy weight. Extra weight puts stress on your back and makes it difficult to have good posture.  Avoid activities or situations  that make you feel anxious or stressed. Stress and anxiety increase muscle tension and can make back pain worse. Learn ways to manage anxiety and stress, such as through exercise. General instructions  Sleep on a firm mattress in a comfortable position. Try lying on your side with your knees slightly bent. If you lie on your back, put a pillow under your knees.  Follow your treatment plan as told by your health care provider. This may include: ? Cognitive or behavioral therapy. ? Acupuncture or massage therapy. ? Meditation or yoga. Contact a health care provider if:  You have pain that is not relieved with rest or medicine.  You have increasing pain going down into your legs or buttocks.  Your pain does not improve after 2 weeks.  You have pain at night.  You lose weight without trying.  You have a fever or chills. Get help right away if:  You develop new bowel or bladder control problems.  You have unusual weakness or numbness in your arms or legs.  You develop nausea or vomiting.  You develop abdominal pain.  You feel faint. Summary  Acute back pain is sudden and usually short-lived.  Use proper lifting techniques. When you bend and lift, use positions that put less stress on your back.  Take over-the-counter and prescription  medicines and apply heat or ice as directed by your health care provider. This information is not intended to replace advice given to you by your health care provider. Make sure you discuss any questions you have with your health care provider. Document Released: 10/20/2005 Document Revised: 02/08/2019 Document Reviewed: 06/03/2017 Elsevier Patient Education  [employer].      If you have lab work done today you will be contacted with your lab results within the next 2 weeks.  If you have not heard from [place] then please contact us. The fastest way to get your results is to register for My Chart.   IF you received an x-ray today, you will receive an invoice from [facility]. Please contact [facility] at [phone] with questions or concerns regarding your invoice.   IF you received labwork today, you will receive an invoice from [place]. Please contact [facility] at [phone] with questions or concerns regarding your invoice.   Our billing staff will not be able to assist you with questions regarding bills from these companies.  You will be contacted with the lab results as soon as they are available. The fastest way to get your results is to activate your My Chart account. Instructions are located on the last page of this paperwork. If you have not heard from [place] regarding the results in 2 weeks, please contact this office.

## 2019-10-20 ENCOUNTER — Other Ambulatory Visit: Payer: Self-pay

## 2019-10-20 ENCOUNTER — Ambulatory Visit (INDEPENDENT_AMBULATORY_CARE_PROVIDER_SITE_OTHER): Payer: Medicare Other | Admitting: Family Medicine

## 2019-10-20 ENCOUNTER — Encounter: Payer: Self-pay | Admitting: Family Medicine

## 2019-10-20 VITALS — BP 116/76 | HR 85 | Temp 97.9°F | Wt 272.0 lb

## 2019-10-20 DIAGNOSIS — E1165 Type 2 diabetes mellitus with hyperglycemia: Secondary | ICD-10-CM | POA: Diagnosis not present

## 2019-10-20 DIAGNOSIS — Z794 Long term (current) use of insulin: Secondary | ICD-10-CM

## 2019-10-20 DIAGNOSIS — I10 Essential (primary) hypertension: Secondary | ICD-10-CM | POA: Diagnosis not present

## 2019-10-20 MED ORDER — LISINOPRIL 5 MG PO TABS: 5.0000 mg | ORAL_TABLET | Freq: Every day | ORAL | 1 refills | Status: DC

## 2019-10-20 MED ORDER — LANTUS SOLOSTAR 100 UNIT/ML ~~LOC~~ SOPN: 24.0000 [IU] | PEN_INJECTOR | Freq: Every day | SUBCUTANEOUS | 1 refills | Status: DC

## 2019-10-20 MED ORDER — METFORMIN HCL 850 MG PO TABS: 850.0000 mg | ORAL_TABLET | Freq: Two times a day (BID) | ORAL | 1 refills | Status: DC

## 2019-10-20 NOTE — Patient Instructions (Addendum)
  No med changes.  Home blood pressure machine appears to be reading too high.  Continue to monitor blood sugars, I will check an A1c level and other blood work today to decide on other changes.  As we discussed, I likely will recommend a statin medicine to take at least once or twice a week to help prevent risk of heart/stroke risk.  Recheck 3 months, sooner if needed.   If you have lab work done today you will be contacted with your lab results within the next 2 weeks.  If you have not heard from [place] then please contact us. The fastest way to get your results is to register for My Chart.   IF you received an x-ray today, you will receive an invoice from [facility]. Please contact [facility] at [phone] with questions or concerns regarding your invoice.   IF you received labwork today, you will receive an invoice from [place]. Please contact [facility] at [phone] with questions or concerns regarding your invoice.   Our billing staff will not be able to assist you with questions regarding bills from these companies.  You will be contacted with the lab results as soon as they are available. The fastest way to get your results is to activate your My Chart account. Instructions are located on the last page of this paperwork. If you have not heard from [place] regarding the results in 2 weeks, please contact this office.

## 2019-10-20 NOTE — Progress Notes (Signed)
Subjective:  Patient ID: Edward Briggs, male    DOB: 12/02/1950  Age: 68 y.o. MRN: [id number]  CC:  Chief Complaint  Patient presents with  . Medical Management of Chronic Issues    1 month f/u on diabetes BS am 159 patient bp machine read 146/98    HPI Edward Briggs presents for   Hypertension: 5 mg/day at last visit.  Home readings were variable.  Remained on same dose of meds. No new side effects.  Home machine 146/98.  BP Readings from Last 3 Encounters:  10/20/19 116/76  09/21/19 (!) 143/88  08/31/19 (!) 147/88   Lab Results  Component Value Date   CREATININE 1.00 07/28/2019      Diabetes: Complicated by hyperglycemia, on insulin.   Recommended increase of Lantus to 24 units/day at his November visit as still having some readings in the 200s at that time.  Continued metformin same dose of 850 mg twice daily. He is on ACE inhibitor, not on statin yet.  Back pain has improved.   Tolerating 24 units lantus. No symptomatic lows. Low 115, high 179.  Home readings, blood sugar 159 fasting today.  Microalbumin: Normal ratio 03/22/2019 Optho, foot exam, pneumovax: Up-to-date  Lab Results  Component Value Date   HGBA1C 9.4 (H) 07/28/2019   HGBA1C 8.6 (A) 03/22/2019   HGBA1C 13.2 (H) 12/07/2018   Lab Results  Component Value Date   MICROALBUR 0.7 02/20/2015   LDLCALC 77 12/07/2018   CREATININE 1.00 07/28/2019      History Patient Active Problem List   Diagnosis Date Noted  . HTN (hypertension) 07/22/2019  . Acute metabolic encephalopathy 40/98/1191  . AKI (acute kidney injury) ([place]) 07/22/2019  . Sepsis ([place]) 07/22/2019  . UTI (urinary tract infection) 07/22/2019  . Diabetes mellitus without complication ([place]) 47/82/9562  . CHEST PAIN 07/21/2009   Past Medical History:  Diagnosis Date  . Diabetes mellitus without complication ([place])   . GERD (gastroesophageal reflux disease)    no meds   Past Surgical History:  Procedure Laterality Date  .  ABSCESS DRAINAGE    . FRACTURE SURGERY    . HARDWARE REMOVAL  03/26/2012   Procedure: HARDWARE REMOVAL;  Surgeon: [name], MD;  Location: [place];  Service: Orthopedics;  Laterality: Left;  hardware removal deep left ankle syndesmotic screws only and stress xrays ankle   . ORIF ANKLE FRACTURE  12/03/2011   Procedure: OPEN REDUCTION INTERNAL FIXATION (ORIF) ANKLE FRACTURE;  Surgeon: [name], MD;  Location: [place];  Service: Orthopedics;  Laterality: Left;  left medial malleolus fracture  . PILONIDAL CYST EXCISION  ~1982  . SKIN GRAFT  ~1970   Lt great toe (graft from Lt thigh)   No Known Allergies Prior to Admission medications   Medication Sig Start Date End Date Taking? Authorizing Provider  Ascorbic Acid (VITAMIN C PO) Take 1 tablet by mouth daily.    Yes [provider]  aspirin 81 MG tablet Take 81 mg by mouth daily.   Yes [provider]  blood glucose meter kit and supplies Dispense based on patient and insurance preference. Use up to four times daily as directed. (FOR ICD-10 E10.9, E11.9). 07/25/19  Yes [name], MD  Blood Glucose Monitoring Suppl (BLOOD GLUCOSE METER) kit Use as instructed 09/10/12  Yes [name], MD  glucose blood (ACCU-CHEK AVIVA) test strip Use as instructed-  2-3 time a day 04/21/19  Yes [name], MD  Insulin Glargine (LANTUS SOLOSTAR) 100 UNIT/ML Solostar Pen Inject 20 Units into the skin at bedtime. 07/25/19  Yes [name], MD  Insulin Pen Needle (PEN NEEDLES) 32G X 4 MM MISC 1 application by Does not apply route daily. BD nano ultrafine 07/29/19  Yes [name], MD  lisinopril (ZESTRIL) 5 MG tablet Take 0.5 tablets (2.5 mg total) by mouth daily. 08/05/19  Yes [name], MD  metFORMIN (GLUCOPHAGE) 850 MG tablet Take 1 tablet (850 mg total) by mouth 2 (two) times daily with a meal. 03/22/19  Yes [name], MD   Social History   Socioeconomic History    . Marital status: Married    Spouse name: Not on file  . Number of children: 2  . Years of education: Not on file  . Highest education level: Not on file  Occupational History  . Not on file  Tobacco Use  . Smoking status: Former Smoker    Quit date: 10/30/1988    Years since quitting: 30.9  . Smokeless tobacco: Never Used  Substance and Sexual Activity  . Alcohol use: Yes    Alcohol/week: 1.0 standard drinks    Types: 1 Shots of liquor per week    Comment: every two weeks  . Drug use: No  . Sexual activity: Yes    Birth control/protection: None  Other Topics Concern  . Not on file  Social History Narrative  . Not on file   Social Determinants of Health   Financial Resource Strain:   . Difficulty of Paying Living Expenses: Not on file  Food Insecurity:   . Worried About Charity fundraiser in the Last Year: Not on file  . Ran Out of Food in the Last Year: Not on file  Transportation Needs:   . Lack of Transportation (Medical): Not on file  . Lack of Transportation (Non-Medical): Not on file  Physical Activity:   . Days of Exercise per Week: Not on file  . Minutes of Exercise per Session: Not on file  Stress:   . Feeling of Stress : Not on file  Social Connections:   . Frequency of Communication with Friends and Family: Not on file  . Frequency of Social Gatherings with Friends and Family: Not on file  . Attends Religious Services: Not on file  . Active Member of Clubs or Organizations: Not on file  . Attends Archivist Meetings: Not on file  . Marital Status: Not on file  Intimate Partner Violence:   . Fear of Current or Ex-Partner: Not on file  . Emotionally Abused: Not on file  . Physically Abused: Not on file  . Sexually Abused: Not on file    Review of Systems  Constitutional: Negative for fatigue and unexpected weight change.  Eyes: Negative for visual disturbance.  Respiratory: Negative for cough, chest tightness and shortness of breath.    Cardiovascular: Negative for chest pain, palpitations and leg swelling.  Gastrointestinal: Positive for diarrhea (once few days ago. not recurrent, no blood , no vomiting. ). Negative for abdominal pain and blood in stool.  Neurological: Negative for dizziness, light-headedness and headaches.     Objective:   Vitals:   10/20/19 1025  BP: 116/76  Pulse: 85  Temp: 97.9 F (36.6 C)  TempSrc: Temporal  SpO2: 98%  Weight: 272 lb (123.4 kg)     Physical Exam Vitals reviewed.  Constitutional:      Appearance: He is well-developed.  HENT:  Head: Normocephalic and atraumatic.  Eyes:     Pupils: Pupils are equal, round, and reactive to light.  Neck:     Vascular: No carotid bruit or JVD.  Cardiovascular:     Rate and Rhythm: Normal rate and regular rhythm.     Heart sounds: Normal heart sounds. No murmur.  Pulmonary:     Effort: Pulmonary effort is normal.     Breath sounds: Normal breath sounds. No rales.  Skin:    General: Skin is warm and dry.  Neurological:     Mental Status: He is alert and oriented to person, place, and time.        Assessment & Plan:  Edward Briggs is a 68 y.o. male . Type 2 diabetes mellitus with hyperglycemia, with long-term current use of insulin (HCC) - Plan: Hemoglobin A1c, Lipid panel, Comprehensive metabolic panel, Insulin Glargine (LANTUS SOLOSTAR) 100 UNIT/ML Solostar Pen, metFORMIN (GLUCOPHAGE) 850 MG tablet  -Check A1c to decide on changes, continue same dose insulin and Metformin for now.  Likely will recommend statin, labs pending first to decide on dose/option.  Essential hypertension - Plan: lisinopril (ZESTRIL) 5 MG tablet  -Wrist machine from home does not appear to be obtaining accurate readings.  New machine recommended.  No changes in dosing based on in office assessment.  Meds ordered this encounter  Medications  . lisinopril (ZESTRIL) 5 MG tablet    Sig: Take 1 tablet (5 mg total) by mouth daily.    Dispense:  90  tablet    Refill:  1  . Insulin Glargine (LANTUS SOLOSTAR) 100 UNIT/ML Solostar Pen    Sig: Inject 24 Units into the skin at bedtime.    Dispense:  15 mL    Refill:  1  . metFORMIN (GLUCOPHAGE) 850 MG tablet    Sig: Take 1 tablet (850 mg total) by mouth 2 (two) times daily with a meal.    Dispense:  180 tablet    Refill:  1   Patient Instructions    No med changes.  Home blood pressure machine appears to be reading too high.  Continue to monitor blood sugars, I will check an A1c level and other blood work today to decide on other changes.  As we discussed, I likely will recommend a statin medicine to take at least once or twice a week to help prevent risk of heart/stroke risk.  Recheck 3 months, sooner if needed.   If you have lab work done today you will be contacted with your lab results within the next 2 weeks.  If you have not heard from [place] then please contact us. The fastest way to get your results is to register for My Chart.   IF you received an x-ray today, you will receive an invoice from [facility]. Please contact [facility]iology at [phone] with questions or concerns regarding your invoice.   IF you received labwork today, you will receive an invoice from [place]. Please contact [facility] at [phone] with questions or concerns regarding your invoice.   Our billing staff will not be able to assist you with questions regarding bills from these companies.  You will be contacted with the lab results as soon as they are available. The fastest way to get your results is to activate your My Chart account. Instructions are located on the last page of this paperwork. If you have not heard from [place] regarding the results in 2 weeks, please contact this office.  Signed, [name], MD [facility]

## 2019-10-21 LAB — COMPREHENSIVE METABOLIC PANEL
ALT: 12 IU/L (ref 0–44)
AST: 13 IU/L (ref 0–40)
Albumin/Globulin Ratio: 1.4 (ref 1.2–2.2)
Albumin: 4.2 g/dL (ref 3.8–4.8)
Alkaline Phosphatase: 64 IU/L (ref 39–117)
BUN/Creatinine Ratio: 14 (ref 10–24)
BUN: 10 mg/dL (ref 8–27)
Bilirubin Total: 0.6 mg/dL (ref 0.0–1.2)
CO2: 18 mmol/L — ABNORMAL LOW (ref 20–29)
Calcium: 9.1 mg/dL (ref 8.6–10.2)
Chloride: 102 mmol/L (ref 96–106)
Creatinine, Ser: 0.7 mg/dL — ABNORMAL LOW (ref 0.76–1.27)
GFR calc Af Amer: 113 mL/min/{1.73_m2} (ref >59)
GFR calc non Af Amer: 98 mL/min/{1.73_m2} (ref >59)
Globulin, Total: 3 g/dL (ref 1.5–4.5)
Glucose: 171 mg/dL — ABNORMAL HIGH (ref 65–99)
Potassium: 4.4 mmol/L (ref 3.5–5.2)
Sodium: 137 mmol/L (ref 134–144)
Total Protein: 7.2 g/dL (ref 6.0–8.5)

## 2019-10-21 LAB — LIPID PANEL
Chol/HDL Ratio: 3.2 ratio (ref 0.0–5.0)
Cholesterol, Total: 137 mg/dL (ref 100–199)
HDL: 43 mg/dL (ref >39)
LDL Chol Calc (NIH): 74 mg/dL (ref 0–99)
Triglycerides: 106 mg/dL (ref 0–149)
VLDL Cholesterol Cal: 20 mg/dL (ref 5–40)

## 2019-10-21 LAB — HEMOGLOBIN A1C
Est. average glucose Bld gHb Est-mCnc: 200 mg/dL
Hgb A1c MFr Bld: 8.6 % — ABNORMAL HIGH (ref 4.8–5.6)

## 2019-10-27 ENCOUNTER — Other Ambulatory Visit: Payer: Self-pay | Admitting: Family Medicine

## 2019-10-27 DIAGNOSIS — Z794 Long term (current) use of insulin: Secondary | ICD-10-CM

## 2019-10-27 DIAGNOSIS — E1165 Type 2 diabetes mellitus with hyperglycemia: Secondary | ICD-10-CM

## 2019-10-27 MED ORDER — ATORVASTATIN CALCIUM 10 MG PO TABS: 10.0000 mg | ORAL_TABLET | ORAL | 0 refills | Status: DC

## 2019-11-23 ENCOUNTER — Ambulatory Visit: Payer: Medicare Other | Admitting: Podiatry

## 2019-11-23 ENCOUNTER — Encounter: Payer: Self-pay | Admitting: Podiatry

## 2019-11-23 ENCOUNTER — Other Ambulatory Visit: Payer: Self-pay

## 2019-11-23 DIAGNOSIS — M79675 Pain in left toe(s): Secondary | ICD-10-CM | POA: Diagnosis not present

## 2019-11-23 DIAGNOSIS — M79674 Pain in right toe(s): Secondary | ICD-10-CM

## 2019-11-23 DIAGNOSIS — B351 Tinea unguium: Secondary | ICD-10-CM

## 2019-11-23 NOTE — Patient Instructions (Signed)
Diabetes Mellitus and Foot Care Foot care is an important part of your health, especially when you have diabetes. Diabetes may cause you to have problems because of poor blood flow (circulation) to your feet and legs, which can cause your skin to:  Become thinner and drier.  Break more easily.  Heal more slowly.  Peel and crack. You may also have nerve damage (neuropathy) in your legs and feet, causing decreased feeling in them. This means that you may not notice minor injuries to your feet that could lead to more serious problems. Noticing and addressing any potential problems early is the best way to prevent future foot problems. How to care for your feet Foot hygiene  Wash your feet daily with warm water and mild soap. Do not use hot water. Then, pat your feet and the areas between your toes until they are completely dry. Do not soak your feet as this can dry your skin.  Trim your toenails straight across. Do not dig under them or around the cuticle. File the edges of your nails with an emery board or nail file.  Apply a moisturizing lotion or petroleum jelly to the skin on your feet and to dry, brittle toenails. Use lotion that does not contain alcohol and is unscented. Do not apply lotion between your toes. Shoes and socks  Wear clean socks or stockings every day. Make sure they are not too tight. Do not wear knee-high stockings since they may decrease blood flow to your legs.  Wear shoes that fit properly and have enough cushioning. Always look in your shoes before you put them on to be sure there are no objects inside.  To break in new shoes, wear them for just a few hours a day. This prevents injuries on your feet. Wounds, scrapes, corns, and calluses  Check your feet daily for blisters, cuts, bruises, sores, and redness. If you cannot see the bottom of your feet, use a mirror or ask someone for help.  Do not cut corns or calluses or try to remove them with medicine.  If you  find a minor scrape, cut, or break in the skin on your feet, keep it and the skin around it clean and dry. You may clean these areas with mild soap and water. Do not clean the area with peroxide, alcohol, or iodine.  If you have a wound, scrape, corn, or callus on your foot, look at it several times a day to make sure it is healing and not infected. Check for: ? Redness, swelling, or pain. ? Fluid or blood. ? Warmth. ? Pus or a bad smell. General instructions  Do not cross your legs. This may decrease blood flow to your feet.  Do not use heating pads or hot water bottles on your feet. They may burn your skin. If you have lost feeling in your feet or legs, you may not know this is happening until it is too late.  Protect your feet from hot and cold by wearing shoes, such as at the beach or on hot pavement.  Schedule a complete foot exam at least once a year (annually) or more often if you have foot problems. If you have foot problems, report any cuts, sores, or bruises to your health care provider immediately. Contact a health care provider if:  You have a medical condition that increases your risk of infection and you have any cuts, sores, or bruises on your feet.  You have an injury that is not   healing.  You have redness on your legs or feet.  You feel burning or tingling in your legs or feet.  You have pain or cramps in your legs and feet.  Your legs or feet are numb.  Your feet always feel cold.  You have pain around a toenail. Get help right away if:  You have a wound, scrape, corn, or callus on your foot and: ? You have pain, swelling, or redness that gets worse. ? You have fluid or blood coming from the wound, scrape, corn, or callus. ? Your wound, scrape, corn, or callus feels warm to the touch. ? You have pus or a bad smell coming from the wound, scrape, corn, or callus. ? You have a fever. ? You have a red line going up your leg. Summary  Check your feet every day  for cuts, sores, red spots, swelling, and blisters.  Moisturize feet and legs daily.  Wear shoes that fit properly and have enough cushioning.  If you have foot problems, report any cuts, sores, or bruises to your health care provider immediately.  Schedule a complete foot exam at least once a year (annually) or more often if you have foot problems. This information is not intended to replace advice given to you by your health care provider. Make sure you discuss any questions you have with your health care provider. Document Revised: 07/13/2019 Document Reviewed: 11/21/2016 Elsevier Patient Education  2020 [employer]vier Inc[employer]  

## 2019-11-27 NOTE — Progress Notes (Signed)
Subjective: Edward Briggs presents today for follow up of preventative diabetic foot care: and painful mycotic nails b/l that are difficult to trim. Pain interferes with ambulation. Aggravating factors include wearing enclosed shoe gear. Pain is relieved with periodic professional debridement.   No Known Allergies   Objective: There were no vitals filed for this visit.  Vascular Examination:  capillary refill time to digits immediate b/l, palpable DP pulses b/l, faintly palpable PT pulses b/l, pedal hair sparse b/l and skin temperature gradient within normal limits b/l  Dermatological Examination: Pedal skin with normal turgor, texture and tone bilaterally and toenails 1-5 b/l elongated, dystrophic, thickened, crumbly with subungual debris  Musculoskeletal: normal muscle strength 5/5 to all lower extremity muscle groups bilaterally and no pain crepitus or joint limitation noted with ROM b/l  Neurological: sensation intact 5/5 intact bilaterally with 10g monofilament and vibratory sensation intact  Assessment: 1. Pain due to onychomycosis of toenails of both feet      Plan: -Toenails 1-5 b/l were debrided in length and girth without iatrogenic bleeding. -Patient to continue soft, supportive shoe gear daily. -Patient to report any pedal injuries to medical professional immediately. -Patient/POA to call should there be question/concern in the interim.  Return in about 3 months (around 02/21/2020) for diabetic nail trim.

## 2019-12-23 ENCOUNTER — Other Ambulatory Visit: Payer: Self-pay | Admitting: Family Medicine

## 2019-12-23 DIAGNOSIS — Z794 Long term (current) use of insulin: Secondary | ICD-10-CM

## 2019-12-23 DIAGNOSIS — E1165 Type 2 diabetes mellitus with hyperglycemia: Secondary | ICD-10-CM

## 2020-01-15 ENCOUNTER — Other Ambulatory Visit: Payer: Self-pay | Admitting: Family Medicine

## 2020-01-15 DIAGNOSIS — E1165 Type 2 diabetes mellitus with hyperglycemia: Secondary | ICD-10-CM

## 2020-01-15 NOTE — Telephone Encounter (Signed)
Requested Prescriptions  Pending Prescriptions Disp Refills  . BD PEN NEEDLE NANO U/F 32G X 4 MM MISC [Pharmacy Med Name: BD UF NANO PEN NEEDLE [id number]] 100 each 2    Sig: USE DAILY AS DIRECTED     Endocrinology: Diabetes - Testing Supplies Passed - 01/15/2020  4:48 PM      Passed - Valid encounter within last 12 months    Recent Outpatient Visits          2 months ago Type 2 diabetes mellitus with hyperglycemia, with long-term current use of insulin [facility])   [facility], [name], MD   3 months ago Acute midline low back pain without sciatica   [facility], [name], MD   4 months ago Acute midline low back pain without sciatica   [facility], [name], MD   5 months ago Medicare annual wellness visit, subsequent   [facility], [name], MD   5 months ago Elevated PSA   [facility], [name], MD      Future Appointments            In 4 days [name], MD [facility] [place], [facility]

## 2020-01-19 ENCOUNTER — Other Ambulatory Visit: Payer: Self-pay

## 2020-01-19 ENCOUNTER — Ambulatory Visit (INDEPENDENT_AMBULATORY_CARE_PROVIDER_SITE_OTHER): Payer: Medicare Other

## 2020-01-19 ENCOUNTER — Ambulatory Visit (INDEPENDENT_AMBULATORY_CARE_PROVIDER_SITE_OTHER): Payer: Medicare Other | Admitting: Family Medicine

## 2020-01-19 ENCOUNTER — Encounter: Payer: Self-pay | Admitting: Family Medicine

## 2020-01-19 VITALS — BP 115/77 | HR 91 | Temp 97.2°F | Ht 73.0 in | Wt 270.0 lb

## 2020-01-19 DIAGNOSIS — S92425A Nondisplaced fracture of distal phalanx of left great toe, initial encounter for closed fracture: Secondary | ICD-10-CM | POA: Diagnosis not present

## 2020-01-19 DIAGNOSIS — I10 Essential (primary) hypertension: Secondary | ICD-10-CM | POA: Diagnosis not present

## 2020-01-19 DIAGNOSIS — E1165 Type 2 diabetes mellitus with hyperglycemia: Secondary | ICD-10-CM | POA: Diagnosis not present

## 2020-01-19 DIAGNOSIS — M79675 Pain in left toe(s): Secondary | ICD-10-CM

## 2020-01-19 DIAGNOSIS — Z794 Long term (current) use of insulin: Secondary | ICD-10-CM | POA: Diagnosis not present

## 2020-01-19 MED ORDER — ATORVASTATIN CALCIUM 10 MG PO TABS: 10.0000 mg | ORAL_TABLET | ORAL | 0 refills | Status: DC

## 2020-01-19 NOTE — Progress Notes (Signed)
Subjective:  Patient ID: Edward Briggs, male    DOB: October 13, 1951  Age: 69 y.o. MRN: [id number]  CC:  Chief Complaint  Patient presents with  . Diabetes    no changes to condition since last visit. pt states he checks his BS once daily first thing in the morning. pt states on avrage his readings are around the 160's mg/dl.   . Follow-up    on hypertension. pt checks his BP daily, and states it's been running high. pt states on  avrage his BP has been 150/95 at home. pt hasn't experianced any physical symptoms of hypertension.    HPI Edward Briggs presents for   Hypertension: Lisinopril 5 mg daily. No new side effects.  Home readings: Some elevated readings at home in the 150/90 range. BP Readings from Last 3 Encounters:  01/19/20 115/77  10/20/19 116/76  09/21/19 (!) 143/88   Lab Results  Component Value Date   CREATININE 0.70 (L) 10/20/2019    Diabetes: Complicated by hyperglycemia.  Insulin-dependent Uncontrolled but improving in December, 6-week follow-up was recommended, continued on Metformin 850 mg twice daily, Lantus 24 units/day.no missed doses.  Some diarrhea past few days only few days ago - not today. No F/C/cough/change in taste/smell. Had 2nd covid shot same day. Not currently on statin  - did not start lipitor. LDL 74 in December.  On ACE inhibitor as above.  Microalbumin: Normal Mar 22, 2019 Optho, foot exam, pneumovax: Up-to-date except due for ophthalmology exam. Home readings around 160s - after meals.  Fasting around 140-145.  No symptomatic lows.  Lab Results  Component Value Date   HGBA1C 8.6 (H) 10/20/2019   HGBA1C 9.4 (H) 07/28/2019   HGBA1C 8.6 (A) 03/22/2019   Lab Results  Component Value Date   MICROALBUR 0.7 02/20/2015   LDLCALC 74 10/20/2019   CREATININE 0.70 (L) 10/20/2019    L big toe pain DOI 1 week ago. [place] out of bed - hit on part of bed. Swelled up. No bruising. Sore. Able to walk on it, but sore.  Scar on toe from injury at  age 12 - no fracture/bone issue. Had skin graft.   History Patient Active Problem List   Diagnosis Date Noted  . HTN (hypertension) 07/22/2019  . Acute metabolic encephalopathy 67/61/9509  . AKI (acute kidney injury) ([place]) 07/22/2019  . Sepsis ([place]) 07/22/2019  . UTI (urinary tract infection) 07/22/2019  . Diabetes mellitus without complication ([place]) 32/67/1245  . CHEST PAIN 07/21/2009   Past Medical History:  Diagnosis Date  . Diabetes mellitus without complication ([place])   . GERD (gastroesophageal reflux disease)    no meds   Past Surgical History:  Procedure Laterality Date  . ABSCESS DRAINAGE    . FRACTURE SURGERY    . HARDWARE REMOVAL  03/26/2012   Procedure: HARDWARE REMOVAL;  Surgeon: [name], MD;  Location: [facility];  Service: Orthopedics;  Laterality: Left;  hardware removal deep left ankle syndesmotic screws only and stress xrays ankle   . ORIF ANKLE FRACTURE  12/03/2011   Procedure: OPEN REDUCTION INTERNAL FIXATION (ORIF) ANKLE FRACTURE;  Surgeon: [name], MD;  Location: [facility];  Service: Orthopedics;  Laterality: Left;  left medial malleolus fracture  . PILONIDAL CYST EXCISION  ~1982  . SKIN GRAFT  ~1970   Lt great toe (graft from Lt thigh)   No Known Allergies Prior to Admission medications   Medication Sig Start Date End Date Taking? Authorizing Provider  Ascorbic Acid (VITAMIN C PO) Take 1 tablet by mouth daily.    Yes [provider]  aspirin 81 MG tablet Take 81 mg by mouth daily.   Yes [provider]  atorvastatin (LIPITOR) 10 MG tablet Take 1 tablet (10 mg total) by mouth once a week. 10/27/19  Yes [name], MD  BD PEN NEEDLE NANO U/F 32G X 4 MM MISC USE DAILY AS DIRECTED 01/15/20  Yes [name], MD  blood glucose meter kit and supplies Dispense based on patient and insurance preference. Use up to four times daily as directed. (FOR ICD-10 E10.9, E11.9). 07/25/19  Yes [name], MD  Blood Glucose Monitoring Suppl (BLOOD GLUCOSE METER) kit Use as instructed 09/10/12  Yes [name], MD  glucose blood (ACCU-CHEK AVIVA) test strip Use as instructed-  2-3 time a day 04/21/19  Yes [name], MD  LANTUS SOLOSTAR 100 UNIT/ML Solostar Pen INJECT 10 UNITS INTO THE SKIN DAILY. 12/23/19  Yes [name], MD  lisinopril (ZESTRIL) 5 MG tablet Take 1 tablet (5 mg total) by mouth daily. 10/20/19  Yes [name], MD  metFORMIN (GLUCOPHAGE) 850 MG tablet Take 1 tablet (850 mg total) by mouth 2 (two) times daily with a meal. 10/20/19  Yes [name], MD   Social History   Socioeconomic History  . Marital status: Married    Spouse name: Not on file  . Number of children: 2  . Years of education: Not on file  . Highest education level: Not on file  Occupational History  . Not on file  Tobacco Use  . Smoking status: Former Smoker    Quit date: 10/30/1988    Years since quitting: 31.2  . Smokeless tobacco: Never Used  Substance and Sexual Activity  . Alcohol use: Yes    Alcohol/week: 1.0 standard drinks    Types: 1 Shots of liquor per week    Comment: every two weeks  . Drug use: No  . Sexual activity: Yes    Birth control/protection: None  Other Topics Concern  . Not on file  Social History Narrative  . Not on file   Social Determinants of Health   Financial Resource Strain:   . Difficulty of Paying Living Expenses:   Food Insecurity:   . Worried About Charity fundraiser in the Last Year:   . Arboriculturist in the Last Year:   Transportation Needs:   . Film/video editor (Medical):   [name] Lack of Transportation (Non-Medical):   Physical Activity:   . Days of Exercise per Week:   . Minutes of Exercise per Session:   Stress:   . Feeling of Stress :   Social Connections:   . Frequency of Communication with Friends and Family:   . Frequency of Social Gatherings with Friends and Family:   . Attends Religious Services:   .  Active Member of Clubs or Organizations:   . Attends Archivist Meetings:   [name] Marital Status:   Intimate Partner Violence:   . Fear of Current or Ex-Partner:   . Emotionally Abused:   [name] Physically Abused:   . Sexually Abused:     Review of Systems  Constitutional: Negative for fatigue and unexpected weight change.  Eyes: Negative for visual disturbance.  Respiratory: Negative for cough, chest tightness and shortness of breath.   Cardiovascular: Negative for chest pain, palpitations and leg swelling.  Gastrointestinal: Negative for abdominal pain  and blood in stool.  Neurological: Negative for dizziness, light-headedness and headaches.     Objective:   Vitals:   01/19/20 1019  BP: 115/77  Pulse: 91  Temp: (!) 97.2 F (36.2 C)  TempSrc: Temporal  SpO2: 98%  Weight: 270 lb (122.5 kg)  Height: _0  (1.854 m)     Physical Exam Vitals reviewed.  Constitutional:      Appearance: He is well-developed.  HENT:     Head: Normocephalic and atraumatic.  Eyes:     Pupils: Pupils are equal, round, and reactive to light.  Neck:     Vascular: No carotid bruit or JVD.  Cardiovascular:     Rate and Rhythm: Normal rate and regular rhythm.     Heart sounds: Normal heart sounds. No murmur.  Pulmonary:     Effort: Pulmonary effort is normal.     Breath sounds: Normal breath sounds. No rales.  Musculoskeletal:     Left foot: Decreased range of motion (Soft tissue swelling at left great toe, skin intact, no ecchymosis.  Tender to palpation over dorsal MTP to dorsal proximal phalanx of great toe.  Cap refill less than 1 second). Swelling and tenderness present.  Skin:    General: Skin is warm and dry.  Neurological:     Mental Status: He is alert and oriented to person, place, and time.     DG Toe Great Left  Result Date: 01/19/2020 CLINICAL DATA:  Fall 1 week ago.  Swelling left great toe. EXAM: LEFT GREAT TOE COMPARISON:  None. FINDINGS: There is a nondisplaced  fracture through the base of the left great toe the distal phalanx, best seen on the lateral view. No subluxation or dislocation. Soft tissues are intact. IMPRESSION: Nondisplaced fracture through the left great toe distal phalanx. Electronically Signed   By: Rolm Baptise M.D.   On: 01/19/2020 11:16    Assessment & Plan:  TRYGVE THAL is a 69 y.o. male . Type 2 diabetes mellitus with hyperglycemia, with long-term current use of insulin ([facility]) - Plan: Hemoglobin A1c, atorvastatin (LIPITOR) 10 MG tablet  -Still some decreased control by home readings.  Will increase Lantus by 2 additional units for dose of 26 units, continue same dose Metformin.  Check A1c.  Essential hypertension - Plan: Lipid panel, Comprehensive metabolic panel  -Borderline, no med changes for now.  In office testing lower than home.  If persistent elevated readings, recommended nurse visit for blood pressure assessment with his meter, check labs.  Great toe pain, left - Plan: DG Toe Great Left, Apply cam walker Nondisplaced fracture of distal phalanx of left great toe, initial encounter for closed fracture - Plan: Apply cam walker  -Nondisplaced distal great phalanx fracture of left foot.  Placed in cam walker, symptomatic care discussed with recheck in 2 weeks.  Option of orthopedic eval discussed but deferred at this time.  Meds ordered this encounter  Medications  . atorvastatin (LIPITOR) 10 MG tablet    Sig: Take 1 tablet (10 mg total) by mouth once a week.    Dispense:  30 tablet    Refill:  0   Patient Instructions    No med changes. If home meter readings high - schedule nurse visit with meter to determine if it is accurate.   Diarrhea may have been from covid vaccine - return if persistent.  Increase lantus to 26 units per day for now and I will check labs. No change in metformin for now.  Start lipitor once per week for cholesterol let me know if any new side effects.   There is a break in the bone at  the end of your great toe.  Wear walking boot for now, recheck in about 2 and half weeks.  Tylenol if needed for pain.  Elevate foot when possible as that may help with swelling. Return to the clinic or go to the nearest emergency room if any of your symptoms worsen or new symptoms occur.   Toe Fracture A toe fracture is a break in one of the toe bones (phalanges). This may happen if you:  Drop a heavy object on your toe.  Stub your toe.  Twist your toe.  Exercise the same way too much. What are the signs or symptoms? The main symptoms are swelling and pain in the toe. You may also have:  Bruising.  Stiffness.  Numbness.  A change in the way the toe looks.  Broken bones that poke through the skin.  Blood under the toenail. How is this treated? Treatments may include:  Taping the broken toe to a toe that is next to it (buddy taping).  Wearing a shoe that has a wide, rigid sole to protect the toe and to limit its movement.  Wearing a cast.  Surgery. This may be needed if the: ? Pieces of broken bone are out of place. ? Bone pokes through the skin.  Physical therapy. Follow these instructions at home: If you have a shoe:  Wear the shoe as told by your doctor. Remove it only as told by your doctor.  Loosen the shoe if your toes tingle, become numb, or turn cold and blue.  Keep the shoe clean and dry. If you have a cast:  Do not put pressure on any part of the cast until it is fully hardened. This may take a few hours.  Do not stick anything inside the cast to scratch your skin.  Check the skin around the cast every day. Tell your doctor about any concerns.  You may put lotion on dry skin around the edges of the cast.  Do not put lotion on the skin under the cast.  Keep the cast clean and dry. Bathing  Do not take baths, swim, or use a hot tub until your doctor says it is okay. Ask your doctor if you can take showers.  If the shoe or cast is not  waterproof: ? Do not let it get wet. ? Cover it with a watertight covering when you take a bath or a shower. Activity  Do not use your foot to support your body weight until your doctor says it is okay.  Use crutches as told by your doctor.  Ask your doctor what activities are safe for you during recovery.  Avoid activities as told by your doctor.  Do exercises as told by your doctor or therapist. Driving  Do not drive or use heavy machinery while taking pain medicine.  Do not drive while wearing a cast on a foot that you use for driving. Managing pain, stiffness, and swelling   Put ice on the injured area if told by your doctor: ? Put ice in a plastic bag. ? Place a towel between your skin and the bag.  If you have a shoe, remove it as told by your doctor.  If you have a cast, place a towel between your cast and the bag. ? Leave the ice on for 20 minutes, 2-3 times  per day.  Raise (elevate) the injured area above the level of your heart while you are sitting or lying down. General instructions  If your toe was taped to a toe that is next to it, follow your doctor's instructions for changing the gauze and tape. Change it more often: ? If the gauze and tape get wet. If this happens, dry the space between the toes. ? If the gauze and tape are too tight and they cause your toe to become pale or to lose feeling (go numb).  If your doctor did not give you a protective shoe, wear sturdy shoes that support your foot. Your shoes should not: ? Pinch your toes. ? Fit tightly against your toes.  Do not use any tobacco products, including cigarettes, chewing tobacco, or e-cigarettes. These can delay bone healing. If you need help quitting, ask your doctor.  Take medicines only as told by your doctor.  Keep all follow-up visits as told by your doctor. This is important. Contact a doctor if:  Your pain medicine is not helping.  You have a fever.  You notice a bad smell coming  from your cast. Get help right away if:  You lose feeling (have numbness) in your toe or foot, and it is getting worse.  Your toe or your foot tingles.  Your toe or your foot gets cold or turns blue.  You have redness or swelling in your toe or foot, and it is getting worse.  You have very bad pain. Summary  A toe fracture is a break in one of the toe bones.  Use ice and raise your foot. This will help lessen pain and swelling.  Use crutches as told by your doctor. This information is not intended to replace advice given to you by your health care provider. Make sure you discuss any questions you have with your health care provider. Document Revised: 12/24/2017 Document Reviewed: 12/01/2017 Elsevier Patient Education  [employer].    If you have lab work done today you will be contacted with your lab results within the next 2 weeks.  If you have not heard from [place] then please contact us. The fastest way to get your results is to register for My Chart.   IF you received an x-ray today, you will receive an invoice from [facility]. Please contact [facility] at [phone] with questions or concerns regarding your invoice.   IF you received labwork today, you will receive an invoice from [facility]. Please contact [facility] at [phone] with questions or concerns regarding your invoice.   Our billing staff will not be able to assist you with questions regarding bills from these companies.  You will be contacted with the lab results as soon as they are available. The fastest way to get your results is to activate your My Chart account. Instructions are located on the last page of this paperwork. If you have not heard from [place] regarding the results in 2 weeks, please contact this office.         Signed, [name], MD [facility]

## 2020-01-19 NOTE — Patient Instructions (Addendum)
No med changes. If home meter readings high - schedule nurse visit with meter to determine if it is accurate.   Diarrhea may have been from covid vaccine - return if persistent.  Increase lantus to 26 units per day for now and I will check labs. No change in metformin for now.   Start lipitor once per week for cholesterol let me know if any new side effects.   There is a break in the bone at the end of your great toe.  Wear walking boot for now, recheck in about 2 and half weeks.  Tylenol if needed for pain.  Elevate foot when possible as that may help with swelling. Return to the clinic or go to the nearest emergency room if any of your symptoms worsen or new symptoms occur.   Toe Fracture A toe fracture is a break in one of the toe bones (phalanges). This may happen if you:  Drop a heavy object on your toe.  Stub your toe.  Twist your toe.  Exercise the same way too much. What are the signs or symptoms? The main symptoms are swelling and pain in the toe. You may also have:  Bruising.  Stiffness.  Numbness.  A change in the way the toe looks.  Broken bones that poke through the skin.  Blood under the toenail. How is this treated? Treatments may include:  Taping the broken toe to a toe that is next to it (buddy taping).  Wearing a shoe that has a wide, rigid sole to protect the toe and to limit its movement.  Wearing a cast.  Surgery. This may be needed if the: ? Pieces of broken bone are out of place. ? Bone pokes through the skin.  Physical therapy. Follow these instructions at home: If you have a shoe:  Wear the shoe as told by your doctor. Remove it only as told by your doctor.  Loosen the shoe if your toes tingle, become numb, or turn cold and blue.  Keep the shoe clean and dry. If you have a cast:  Do not put pressure on any part of the cast until it is fully hardened. This may take a few hours.  Do not stick anything inside the cast to scratch  your skin.  Check the skin around the cast every day. Tell your doctor about any concerns.  You may put lotion on dry skin around the edges of the cast.  Do not put lotion on the skin under the cast.  Keep the cast clean and dry. Bathing  Do not take baths, swim, or use a hot tub until your doctor says it is okay. Ask your doctor if you can take showers.  If the shoe or cast is not waterproof: ? Do not let it get wet. ? Cover it with a watertight covering when you take a bath or a shower. Activity  Do not use your foot to support your body weight until your doctor says it is okay.  Use crutches as told by your doctor.  Ask your doctor what activities are safe for you during recovery.  Avoid activities as told by your doctor.  Do exercises as told by your doctor or therapist. Driving  Do not drive or use heavy machinery while taking pain medicine.  Do not drive while wearing a cast on a foot that you use for driving. Managing pain, stiffness, and swelling   Put ice on the injured area if told by your doctor: ?  Put ice in a plastic bag. ? Place a towel between your skin and the bag.  If you have a shoe, remove it as told by your doctor.  If you have a cast, place a towel between your cast and the bag. ? Leave the ice on for 20 minutes, 2-3 times per day.  Raise (elevate) the injured area above the level of your heart while you are sitting or lying down. General instructions  If your toe was taped to a toe that is next to it, follow your doctor's instructions for changing the gauze and tape. Change it more often: ? If the gauze and tape get wet. If this happens, dry the space between the toes. ? If the gauze and tape are too tight and they cause your toe to become pale or to lose feeling (go numb).  If your doctor did not give you a protective shoe, wear sturdy shoes that support your foot. Your shoes should not: ? Pinch your toes. ? Fit tightly against your  toes.  Do not use any tobacco products, including cigarettes, chewing tobacco, or e-cigarettes. These can delay bone healing. If you need help quitting, ask your doctor.  Take medicines only as told by your doctor.  Keep all follow-up visits as told by your doctor. This is important. Contact a doctor if:  Your pain medicine is not helping.  You have a fever.  You notice a bad smell coming from your cast. Get help right away if:  You lose feeling (have numbness) in your toe or foot, and it is getting worse.  Your toe or your foot tingles.  Your toe or your foot gets cold or turns blue.  You have redness or swelling in your toe or foot, and it is getting worse.  You have very bad pain. Summary  A toe fracture is a break in one of the toe bones.  Use ice and raise your foot. This will help lessen pain and swelling.  Use crutches as told by your doctor. This information is not intended to replace advice given to you by your health care provider. Make sure you discuss any questions you have with your health care provider. Document Revised: 12/24/2017 Document Reviewed: 12/01/2017 Elsevier Patient Education  [employer].    If you have lab work done today you will be contacted with your lab results within the next 2 weeks.  If you have not heard from [place] then please contact us. The fastest way to get your results is to register for My Chart.   IF you received an x-ray today, you will receive an invoice from [facility]. Please contact [facility] at [phone] with questions or concerns regarding your invoice.   IF you received labwork today, you will receive an invoice from [facility]. Please contact [facility] at [phone] with questions or concerns regarding your invoice.   Our billing staff will not be able to assist you with questions regarding bills from these companies.  You will be contacted with the lab results as soon as they are available.  The fastest way to get your results is to activate your My Chart account. Instructions are located on the last page of this paperwork. If you have not heard from [place] regarding the results in 2 weeks, please contact this office.

## 2020-01-20 LAB — LIPID PANEL
Chol/HDL Ratio: 3.4 ratio (ref 0.0–5.0)
Cholesterol, Total: 148 mg/dL (ref 100–199)
HDL: 43 mg/dL (ref >39)
LDL Chol Calc (NIH): 84 mg/dL (ref 0–99)
Triglycerides: 114 mg/dL (ref 0–149)
VLDL Cholesterol Cal: 21 mg/dL (ref 5–40)

## 2020-01-20 LAB — HEMOGLOBIN A1C
Est. average glucose Bld gHb Est-mCnc: 206 mg/dL
Hgb A1c MFr Bld: 8.8 % — ABNORMAL HIGH (ref 4.8–5.6)

## 2020-01-20 LAB — COMPREHENSIVE METABOLIC PANEL
ALT: 14 IU/L (ref 0–44)
AST: 16 IU/L (ref 0–40)
Albumin/Globulin Ratio: 1.4 (ref 1.2–2.2)
Albumin: 4.3 g/dL (ref 3.8–4.8)
Alkaline Phosphatase: 64 IU/L (ref 39–117)
BUN/Creatinine Ratio: 11 (ref 10–24)
BUN: 11 mg/dL (ref 8–27)
Bilirubin Total: 0.6 mg/dL (ref 0.0–1.2)
CO2: 21 mmol/L (ref 20–29)
Calcium: 9.6 mg/dL (ref 8.6–10.2)
Chloride: 102 mmol/L (ref 96–106)
Creatinine, Ser: 0.99 mg/dL (ref 0.76–1.27)
GFR calc Af Amer: 90 mL/min/{1.73_m2} (ref >59)
GFR calc non Af Amer: 78 mL/min/{1.73_m2} (ref >59)
Globulin, Total: 3 g/dL (ref 1.5–4.5)
Glucose: 171 mg/dL — ABNORMAL HIGH (ref 65–99)
Potassium: 4.8 mmol/L (ref 3.5–5.2)
Sodium: 138 mmol/L (ref 134–144)
Total Protein: 7.3 g/dL (ref 6.0–8.5)

## 2020-02-03 LAB — HM DIABETES EYE EXAM

## 2020-02-09 ENCOUNTER — Other Ambulatory Visit: Payer: Self-pay

## 2020-02-09 ENCOUNTER — Ambulatory Visit (INDEPENDENT_AMBULATORY_CARE_PROVIDER_SITE_OTHER): Payer: Medicare Other

## 2020-02-09 ENCOUNTER — Ambulatory Visit (INDEPENDENT_AMBULATORY_CARE_PROVIDER_SITE_OTHER): Payer: Medicare Other | Admitting: Family Medicine

## 2020-02-09 ENCOUNTER — Encounter: Payer: Self-pay | Admitting: Family Medicine

## 2020-02-09 VITALS — BP 105/73 | HR 95 | Temp 98.0°F | Ht 73.0 in | Wt 271.0 lb

## 2020-02-09 DIAGNOSIS — Z794 Long term (current) use of insulin: Secondary | ICD-10-CM

## 2020-02-09 DIAGNOSIS — S92425A Nondisplaced fracture of distal phalanx of left great toe, initial encounter for closed fracture: Secondary | ICD-10-CM | POA: Diagnosis not present

## 2020-02-09 DIAGNOSIS — E1165 Type 2 diabetes mellitus with hyperglycemia: Secondary | ICD-10-CM

## 2020-02-09 DIAGNOSIS — S92425D Nondisplaced fracture of distal phalanx of left great toe, subsequent encounter for fracture with routine healing: Secondary | ICD-10-CM

## 2020-02-09 MED ORDER — METFORMIN HCL 850 MG PO TABS: 850.0000 mg | ORAL_TABLET | Freq: Two times a day (BID) | ORAL | 1 refills | Status: DC

## 2020-02-09 NOTE — Patient Instructions (Addendum)
Check fasting blood sugars as well. If those remain over 130, can increase insulin to 28 units per day. Watch for low blood sugars if you do increase dose of insulin.   If you can wear a hard soled shoe such as a hiking boot or work boot, okay to just buddy tape the great toe to the second toe for the next 3 weeks.  Make sure to move your ankle a few times per day to keep it from getting stiff.  Recheck in 3 weeks for repeat x-ray.  Sooner if worse.   Type 2 Diabetes Mellitus, Self Care, Adult Caring for yourself after you have been diagnosed with type 2 diabetes (type 2 diabetes mellitus) means keeping your blood sugar (glucose) under control with a balance of:  Nutrition.  Exercise.  Lifestyle changes.  Medicines or insulin, if necessary.  Support from your team of health care providers and others. The following information explains what you need to know to manage your diabetes at home. What are the risks? Having diabetes can put you at risk for other long-term (chronic) conditions, such as heart disease and kidney disease. Your health care provider may prescribe medicines to help prevent complications from diabetes. These medicines may include:  Aspirin.  Medicine to lower cholesterol.  Medicine to control blood pressure. How to monitor blood glucose   Check your blood glucose every day, as often as told by your health care provider.  Have your A1c (hemoglobin A1c) level checked two or more times a year, or as often as told by your health care provider. Your health care provider will set individualized treatment goals for you. Generally, the goal of treatment is to maintain the following blood glucose levels:  Before meals (preprandial): 80-130 mg/dL (4.4-7.2 mmol/L).  After meals (postprandial): below 180 mg/dL (10 mmol/L).  A1c level: less than 7%. How to manage hyperglycemia and hypoglycemia Hyperglycemia symptoms Hyperglycemia, also called high blood glucose, occurs  when blood glucose is too high. Make sure you know the early signs of hyperglycemia, such as:  Increased thirst.  Hunger.  Feeling very tired.  Needing to urinate more often than usual.  Blurry vision. Hypoglycemia symptoms Hypoglycemia, also called low blood glucose, occurswith a blood glucose level at or below 70 mg/dL (3.9 mmol/L). The risk for hypoglycemia increases during or after exercise, during sleep, during illness, and when skipping meals or not eating for a long time (fasting). It is important to know the symptoms of hypoglycemia and treat it right away. Always have a 15-gram rapid-acting carbohydrate snack with you to treat low blood glucose. Family members and close friends should also know the symptoms and should understand how to treat hypoglycemia, in case you are not able to treat yourself. Symptoms may include:  Hunger.  Anxiety.  Sweating and feeling clammy.  Confusion.  Dizziness or feeling light-headed.  Sleepiness.  Nausea.  Increased heart rate.  Headache.  Blurry vision.  Irritability.  A change in coordination.  Tingling or numbness around the mouth, lips, or tongue.  Restless sleep.  Fainting.  Seizure. Treating hypoglycemia If you are alert and able to swallow safely, follow the 15:15 rule:  Take 15 grams of a rapid-acting carbohydrate. Talk with your health care provider about how much you should take.  Rapid-acting options include: ? Glucose pills (take 15 grams). ? 6-8 pieces of hard candy. ? 4-6 oz (120-150 mL) of fruit juice. ? 4-6 oz (120-150 mL) of regular (not diet) soda. ? 1 Tbsp (15 mL)  honey or sugar.  Check your blood glucose 15 minutes after you take the carbohydrate.  If the repeat blood glucose level is still at or below 70 mg/dL (3.9 mmol/L), take 15 grams of a carbohydrate again.  If your blood glucose level does not increase above 70 mg/dL (3.9 mmol/L) after 3 tries, seek emergency medical care.  After your  blood glucose level returns to normal, eat a meal or a snack within 1 hour. Treating severe hypoglycemia Severe hypoglycemia is when your blood glucose level is at or below 54 mg/dL (3 mmol/L). Severe hypoglycemia is an emergency. Do not wait to see if the symptoms will go away. Get medical help right away. Call your local emergency services (911 in the [place] If you have severe hypoglycemia and you cannot eat or drink, you may need an injection of glucagon. A family member or close friend should learn how to check your blood glucose and how to give you a glucagon injection. Ask your health care provider if you need to have an emergency glucagon injection kit available. Severe hypoglycemia may need to be treated in a hospital. The treatment may include getting glucose through an IV. You may also need treatment for the cause of your hypoglycemia. Follow these instructions at home: Take diabetes medicines as told  If your health care provider prescribed insulin or diabetes medicines, take them every day.  Do not run out of insulin or other diabetes medicines that you take. Plan ahead so you always have these available.  If you use insulin, adjust your dosage based on how physically active you are and what foods you eat. Your health care provider will tell you how to adjust your dosage. Make healthy food choices  The things that you eat and drink affect your blood glucose and your insulin dosage. Making good choices helps to control your diabetes and prevent other health problems. A healthy meal plan includes eating lean proteins, complex carbohydrates, fresh fruits and vegetables, low-fat dairy products, and healthy fats. Make an appointment to see a diet and nutrition specialist (registered dietitian) to help you create an eating plan that is right for you. Make sure that you:  Follow instructions from your health care provider about eating or drinking restrictions.  Drink enough fluid to keep  your urine pale yellow.  Keep a record of the carbohydrates that you eat. Do this by reading food labels and learning the standard serving sizes of foods.  Follow your sick day plan whenever you cannot eat or drink as usual. Make this plan in advance with your health care provider.  Stay active Exercise regularly, as told by your health care provider. This may include:  Stretching and doing strength exercises, such as yoga or weightlifting, 2 or more times a week.  Doing 150 minutes or more of moderate-intensity or vigorous-intensity exercise each week. This could be brisk walking, biking, or water aerobics. ? Spread out your activity over 3 or more days of the week. ? Do not go more than 2 days in a row without doing some kind of physical activity. When you start a new exercise or activity, work with your health care provider to adjust your insulin, medicines, or food intake as needed. Make healthy lifestyle choices  Do not use any tobacco products, such as cigarettes, chewing tobacco, and e-cigarettes. If you need help quitting, ask your health care provider.  If your health care provider says that alcohol is safe for you, limit alcohol intake to no  more than 1 drink per day for nonpregnant women and 2 drinks per day for men. One drink equals 12 oz of beer (355 mL), 5 oz of wine (148 mL), or 1 oz of hard liquor (44 mL).  Learn to manage stress. If you need help with this, ask your health care provider. Care for your body   Keep your immunizations up to date. In addition to getting vaccinations as told by your health care provider, it is recommended that you get vaccinated against the following illnesses: ? The flu (influenza). Get a flu shot every year. ? Pneumonia. ? Hepatitis B.  Schedule an eye exam soon after your diagnosis, and then one time every year after that.  Check your skin and feet every day for cuts, bruises, redness, blisters, or sores. Schedule a foot exam with your  health care provider once every year.  Brush your teeth and gums two times a day, and floss one or more times a day. Visit your dentist one or more times every 6 months.  Maintain a healthy weight. General instructions  Take over-the-counter and prescription medicines only as told by your health care provider.  Share your diabetes management plan with people in your workplace, school, and household.  Carry a medical alert card or wear medical alert jewelry.  Keep all follow-up visits as told by your health care provider. This is important. Questions to ask your health care provider  Do I need to meet with a diabetes educator?  Where can I find a support group for people with diabetes? Where to find more information For more information about diabetes, visit:  American Diabetes Association (ADA): www.diabetes.org  American Association of Diabetes Educators (AADE): www.diabeteseducator.org Summary  Caring for yourself after you have been diagnosed with (type 2 diabetes mellitus) means keeping your blood sugar (glucose) under control with a balance of nutrition, exercise, lifestyle changes, and medicine.  Check your blood glucose every day, as often as told by your health care provider.  Having diabetes can put you at risk for other long-term (chronic) conditions, such as heart disease and kidney disease. Your health care provider may prescribe medicines to help prevent complications from diabetes.  Keep all follow-up visits as told by your health care provider. This is important. This information is not intended to replace advice given to you by your health care provider. Make sure you discuss any questions you have with your health care provider. Document Revised: 04/12/2018 Document Reviewed: 11/23/2015 Elsevier Patient Education  [employer].    If you have lab work done today you will be contacted with your lab results within the next 2 weeks.  If you have not heard  from [place] then please contact us. The fastest way to get your results is to register for My Chart.   IF you received an x-ray today, you will receive an invoice from [facility]. Please contact [facility] at [phone] with questions or concerns regarding your invoice.   IF you received labwork today, you will receive an invoice from [facility]. Please contact [facility] at [phone] with questions or concerns regarding your invoice.   Our billing staff will not be able to assist you with questions regarding bills from these companies.  You will be contacted with the lab results as soon as they are available. The fastest way to get your results is to activate your My Chart account. Instructions are located on the last page of this paperwork. If you have not heard from  [place] regarding the results in 2 weeks, please contact this office.

## 2020-02-09 NOTE — Progress Notes (Signed)
Subjective:  Patient ID: Edward Briggs, male    DOB: 04-29-1951  Age: 69 y.o. MRN: [id number]  CC:  Chief Complaint  Patient presents with  . Follow-up    on fracture to L foot grate toe. pt states he hasn't had any issues with it and has done his best to stay off it. pt wares his boot when he dose have to go out. swelling has started to come down per pt. pt states he hasn't had any pain in that area lately.    HPI Edward Briggs presents for   Left great toe fracture: Nondisplaced distal great phalanx fracture of the left great toe noted on x-ray March 18.  Date of injury approximately March 11.  Placed in cam walker. Wears CAM Walker with activity outside of the house. Wears regular shoe in house.  Min to no pain - no meds needed. No falls or difficulty with use of CAm walker.   Diabetes: Complicated by hyperglycemia. A1c still elevated last visit at 8.8.  Still some elevated readings at March 18 visit.  Lantus increased to 26 units, remained on same dose Metformin 858m BID. Diarrhea resolved.  home readings. Fasting: unknown.  2 hr PP: around 179.  No symptomatic lows.  Taking lipitor once per week - no side effects.  On ACE-I.   Lab Results  Component Value Date   HGBA1C 8.8 (H) 01/19/2020   HGBA1C 8.6 (H) 10/20/2019   HGBA1C 9.4 (H) 07/28/2019   Lab Results  Component Value Date   MICROALBUR 0.7 02/20/2015   LDLCALC 84 01/19/2020   CREATININE 0.99 01/19/2020     History Patient Active Problem List   Diagnosis Date Noted  . HTN (hypertension) 07/22/2019  . Acute metabolic encephalopathy 062/69/4854 . AKI (acute kidney injury) ([facility] 07/22/2019  . Sepsis ([facility] 07/22/2019  . UTI (urinary tract infection) 07/22/2019  . Diabetes mellitus without complication ([facility] 162/70/3500 . CHEST PAIN 07/21/2009   Past Medical History:  Diagnosis Date  . Diabetes mellitus without complication ([place]   . GERD (gastroesophageal reflux disease)    no meds   Past  Surgical History:  Procedure Laterality Date  . ABSCESS DRAINAGE    . FRACTURE SURGERY    . HARDWARE REMOVAL  03/26/2012   Procedure: HARDWARE REMOVAL;  Surgeon: PColin Rhein MD;  [facility]  Service: Orthopedics;  Laterality: Left;  hardware removal deep left ankle syndesmotic screws only and stress xrays ankle   . ORIF ANKLE FRACTURE  12/03/2011   Procedure: OPEN REDUCTION INTERNAL FIXATION (ORIF) ANKLE FRACTURE;  Surgeon: PColin Rhein MD;  [place] [facility]  Service: Orthopedics;  Laterality: Left;  left medial malleolus fracture  . PILONIDAL CYST EXCISION  ~1982  . SKIN GRAFT  ~1970   Lt great toe (graft from Lt thigh)   No Known Allergies Prior to Admission medications   Medication Sig Start Date End Date Taking? Authorizing Provider  Ascorbic Acid (VITAMIN C PO) Take 1 tablet by mouth daily.    Yes [provider]  aspirin 81 MG tablet Take 81 mg by mouth daily.   Yes [provider]  atorvastatin (LIPITOR) 10 MG tablet Take 1 tablet (10 mg total) by mouth once a week. 01/19/20  Yes GWendie Agreste MD  BD PEN NEEDLE NANO U/F 32G X 4 MM MISC USE DAILY AS DIRECTED 01/15/20  Yes GWendie Agreste MD  blood glucose meter kit and supplies Dispense  based on patient and insurance preference. Use up to four times daily as directed. (FOR ICD-10 E10.9, E11.9). 07/25/19  Yes [name], MD  Blood Glucose Monitoring Suppl (BLOOD GLUCOSE METER) kit Use as instructed 09/10/12  Yes [name], MD  glucose blood (ACCU-CHEK AVIVA) test strip Use as instructed-  2-3 time a day 04/21/19  Yes [name], MD  LANTUS SOLOSTAR 100 UNIT/ML Solostar Pen INJECT 10 UNITS INTO THE SKIN DAILY. 12/23/19  Yes [name], MD  lisinopril (ZESTRIL) 5 MG tablet Take 1 tablet (5 mg total) by mouth daily. 10/20/19  Yes [name], MD  metFORMIN (GLUCOPHAGE) 850 MG tablet Take 1 tablet (850 mg total) by mouth 2 (two) times daily  with a meal. 10/20/19  Yes [name], MD   Social History   Socioeconomic History  . Marital status: Married    Spouse name: Not on file  . Number of children: 2  . Years of education: Not on file  . Highest education level: Not on file  Occupational History  . Not on file  Tobacco Use  . Smoking status: Former Smoker    Quit date: 10/30/1988    Years since quitting: 31.2  . Smokeless tobacco: Never Used  Substance and Sexual Activity  . Alcohol use: Yes    Alcohol/week: 1.0 standard drinks    Types: 1 Shots of liquor per week    Comment: every two weeks  . Drug use: No  . Sexual activity: Yes    Birth control/protection: None  Other Topics Concern  . Not on file  Social History Narrative  . Not on file   Social Determinants of Health   Financial Resource Strain:   . Difficulty of Paying Living Expenses:   Food Insecurity:   . Worried About Charity fundraiser in the Last Year:   . Arboriculturist in the Last Year:   Transportation Needs:   . Film/video editor (Medical):   [name] Lack of Transportation (Non-Medical):   Physical Activity:   . Days of Exercise per Week:   . Minutes of Exercise per Session:   Stress:   . Feeling of Stress :   Social Connections:   . Frequency of Communication with Friends and Family:   . Frequency of Social Gatherings with Friends and Family:   . Attends Religious Services:   . Active Member of Clubs or Organizations:   . Attends Archivist Meetings:   [name] Marital Status:   Intimate Partner Violence:   . Fear of Current or Ex-Partner:   . Emotionally Abused:   [name] Physically Abused:   . Sexually Abused:     Review of Systems   Objective:   Vitals:   02/09/20 1040  BP: 105/73  Pulse: 95  Temp: 98 F (36.7 C)  TempSrc: Temporal  SpO2: 97%  Weight: 271 lb (122.9 kg)  Height: 6' 1"  (1.854 m)     Physical Exam Vitals reviewed.  Constitutional:      General: He is not in acute distress.     Appearance: He is well-developed.  HENT:     Head: Normocephalic and atraumatic.  Cardiovascular:     Rate and Rhythm: Normal rate.  Pulmonary:     Effort: Pulmonary effort is normal.  Musculoskeletal:     Comments: Left great toe, no focal bony tenderness, no apparent deformity, cap refill less than 1 subcostally.  Able to flex and extend at IP  joint, MTP.  Neurological:     Mental Status: He is alert and oriented to person, place, and time.     DG Toe Great Left  Result Date: 02/09/2020 CLINICAL DATA:  Follow-up left great toe fracture EXAM: LEFT GREAT TOE COMPARISON:  01/19/2020 FINDINGS: Interval development of sclerotic healing of a very subtle nondisplaced fracture of the base of the left great toe distal phalanx, seen only on lateral view. Suspect an additional, partially callused and healing nondisplaced oblique fracture through the proximal phalanx of the great, which is now easier to see with absorptive early healing. Screw fixation of ankle fractures, partially included. IMPRESSION: 1. Interval development of sclerotic healing of a very subtle nondisplaced fracture of the base of the left great toe distal phalanx, seen only on lateral view. 2. Suspect an additional, partially callused and healing nondisplaced oblique fracture through the proximal phalanx of the great, which is now easier to see with absorptive early healing. Electronically Signed   By: Eddie Candle M.D.   On: 02/09/2020 11:46   DG Toe Great Left  Result Date: 01/19/2020 CLINICAL DATA:  Fall 1 week ago.  Swelling left great toe. EXAM: LEFT GREAT TOE COMPARISON:  None. FINDINGS: There is a nondisplaced fracture through the base of the left great toe the distal phalanx, best seen on the lateral view. No subluxation or dislocation. Soft tissues are intact. IMPRESSION: Nondisplaced fracture through the left great toe distal phalanx. Electronically Signed   By: Rolm Baptise M.D.   On: 01/19/2020 11:16      Assessment &  Plan:  Edward Briggs is a 69 y.o. male . Nondisplaced fracture of distal phalanx of left great toe, initial encounter for closed fracture  -Clinically healing, radiographic healing as well with notation of possible small proximal phalanx great toe fracture, also appearing stable.  At this point can transition to buddy taping with hard soled shoe such as hiking boot or work.,  Or cam walker for the next 3 weeks, with range of motion the ankle, recheck 3 weeks for repeat imaging.  Type 2 diabetes mellitus with hyperglycemia, with long-term current use of insulin ([place]) - Plan: metFORMIN (GLUCOPHAGE) 850 MG tablet, DG Toe Great Left  -Home monitoring discussed with option of 2 additional units of insulin if persistent elevated fasting/postprandials.  Hypoglycemic precautions discussed  Meds ordered this encounter  Medications  . metFORMIN (GLUCOPHAGE) 850 MG tablet    Sig: Take 1 tablet (850 mg total) by mouth 2 (two) times daily with a meal.    Dispense:  180 tablet    Refill:  1   Patient Instructions   Check fasting blood sugars as well. If those remain over 130, can increase insulin to 28 units per day. Watch for low blood sugars if you do increase dose of insulin.   If you can wear a hard soled shoe such as a hiking boot or work boot, okay to just buddy tape the great toe to the second toe for the next 3 weeks.  Make sure to move your ankle a few times per day to keep it from getting stiff.  Recheck in 3 weeks for repeat x-ray.  Sooner if worse.   Type 2 Diabetes Mellitus, Self Care, Adult Caring for yourself after you have been diagnosed with type 2 diabetes (type 2 diabetes mellitus) means keeping your blood sugar (glucose) under control with a balance of:  Nutrition.  Exercise.  Lifestyle changes.  Medicines or insulin, if necessary.  Support  from your team of health care providers and others. The following information explains what you need to know to manage your diabetes at  home. What are the risks? Having diabetes can put you at risk for other long-term (chronic) conditions, such as heart disease and kidney disease. Your health care provider may prescribe medicines to help prevent complications from diabetes. These medicines may include:  Aspirin.  Medicine to lower cholesterol.  Medicine to control blood pressure. How to monitor blood glucose   Check your blood glucose every day, as often as told by your health care provider.  Have your A1c (hemoglobin A1c) level checked two or more times a year, or as often as told by your health care provider. Your health care provider will set individualized treatment goals for you. Generally, the goal of treatment is to maintain the following blood glucose levels:  Before meals (preprandial): 80-130 mg/dL (4.4-7.2 mmol/L).  After meals (postprandial): below 180 mg/dL (10 mmol/L).  A1c level: less than 7%. How to manage hyperglycemia and hypoglycemia Hyperglycemia symptoms Hyperglycemia, also called high blood glucose, occurs when blood glucose is too high. Make sure you know the early signs of hyperglycemia, such as:  Increased thirst.  Hunger.  Feeling very tired.  Needing to urinate more often than usual.  Blurry vision. Hypoglycemia symptoms Hypoglycemia, also called low blood glucose, occurswith a blood glucose level at or below 70 mg/dL (3.9 mmol/L). The risk for hypoglycemia increases during or after exercise, during sleep, during illness, and when skipping meals or not eating for a long time (fasting). It is important to know the symptoms of hypoglycemia and treat it right away. Always have a 15-gram rapid-acting carbohydrate snack with you to treat low blood glucose. Family members and close friends should also know the symptoms and should understand how to treat hypoglycemia, in case you are not able to treat yourself. Symptoms may include:  Hunger.  Anxiety.  Sweating and feeling  clammy.  Confusion.  Dizziness or feeling light-headed.  Sleepiness.  Nausea.  Increased heart rate.  Headache.  Blurry vision.  Irritability.  A change in coordination.  Tingling or numbness around the mouth, lips, or tongue.  Restless sleep.  Fainting.  Seizure. Treating hypoglycemia If you are alert and able to swallow safely, follow the 15:15 rule:  Take 15 grams of a rapid-acting carbohydrate. Talk with your health care provider about how much you should take.  Rapid-acting options include: ? Glucose pills (take 15 grams). ? 6-8 pieces of hard candy. ? 4-6 oz (120-150 mL) of fruit juice. ? 4-6 oz (120-150 mL) of regular (not diet) soda. ? 1 Tbsp (15 mL) honey or sugar.  Check your blood glucose 15 minutes after you take the carbohydrate.  If the repeat blood glucose level is still at or below 70 mg/dL (3.9 mmol/L), take 15 grams of a carbohydrate again.  If your blood glucose level does not increase above 70 mg/dL (3.9 mmol/L) after 3 tries, seek emergency medical care.  After your blood glucose level returns to normal, eat a meal or a snack within 1 hour. Treating severe hypoglycemia Severe hypoglycemia is when your blood glucose level is at or below 54 mg/dL (3 mmol/L). Severe hypoglycemia is an emergency. Do not wait to see if the symptoms will go away. Get medical help right away. Call your local emergency services (911 in the [place] If you have severe hypoglycemia and you cannot eat or drink, you may need an injection of glucagon. A family member  or close friend should learn how to check your blood glucose and how to give you a glucagon injection. Ask your health care provider if you need to have an emergency glucagon injection kit available. Severe hypoglycemia may need to be treated in a hospital. The treatment may include getting glucose through an IV. You may also need treatment for the cause of your hypoglycemia. Follow these instructions at  home: Take diabetes medicines as told  If your health care provider prescribed insulin or diabetes medicines, take them every day.  Do not run out of insulin or other diabetes medicines that you take. Plan ahead so you always have these available.  If you use insulin, adjust your dosage based on how physically active you are and what foods you eat. Your health care provider will tell you how to adjust your dosage. Make healthy food choices  The things that you eat and drink affect your blood glucose and your insulin dosage. Making good choices helps to control your diabetes and prevent other health problems. A healthy meal plan includes eating lean proteins, complex carbohydrates, fresh fruits and vegetables, low-fat dairy products, and healthy fats. Make an appointment to see a diet and nutrition specialist (registered dietitian) to help you create an eating plan that is right for you. Make sure that you:  Follow instructions from your health care provider about eating or drinking restrictions.  Drink enough fluid to keep your urine pale yellow.  Keep a record of the carbohydrates that you eat. Do this by reading food labels and learning the standard serving sizes of foods.  Follow your sick day plan whenever you cannot eat or drink as usual. Make this plan in advance with your health care provider.  Stay active Exercise regularly, as told by your health care provider. This may include:  Stretching and doing strength exercises, such as yoga or weightlifting, 2 or more times a week.  Doing 150 minutes or more of moderate-intensity or vigorous-intensity exercise each week. This could be brisk walking, biking, or water aerobics. ? Spread out your activity over 3 or more days of the week. ? Do not go more than 2 days in a row without doing some kind of physical activity. When you start a new exercise or activity, work with your health care provider to adjust your insulin, medicines, or food  intake as needed. Make healthy lifestyle choices  Do not use any tobacco products, such as cigarettes, chewing tobacco, and e-cigarettes. If you need help quitting, ask your health care provider.  If your health care provider says that alcohol is safe for you, limit alcohol intake to no more than 1 drink per day for nonpregnant women and 2 drinks per day for men. One drink equals 12 oz of beer (355 mL), 5 oz of wine (148 mL), or 1 oz of hard liquor (44 mL).  Learn to manage stress. If you need help with this, ask your health care provider. Care for your body   Keep your immunizations up to date. In addition to getting vaccinations as told by your health care provider, it is recommended that you get vaccinated against the following illnesses: ? The flu (influenza). Get a flu shot every year. ? Pneumonia. ? Hepatitis B.  Schedule an eye exam soon after your diagnosis, and then one time every year after that.  Check your skin and feet every day for cuts, bruises, redness, blisters, or sores. Schedule a foot exam with your health care provider once  every year.  Brush your teeth and gums two times a day, and floss one or more times a day. Visit your dentist one or more times every 6 months.  Maintain a healthy weight. General instructions  Take over-the-counter and prescription medicines only as told by your health care provider.  Share your diabetes management plan with people in your workplace, school, and household.  Carry a medical alert card or wear medical alert jewelry.  Keep all follow-up visits as told by your health care provider. This is important. Questions to ask your health care provider  Do I need to meet with a diabetes educator?  Where can I find a support group for people with diabetes? Where to find more information For more information about diabetes, visit:  American Diabetes Association (ADA): www.diabetes.org  American Association of Diabetes Educators  (AADE): www.diabeteseducator.org Summary  Caring for yourself after you have been diagnosed with (type 2 diabetes mellitus) means keeping your blood sugar (glucose) under control with a balance of nutrition, exercise, lifestyle changes, and medicine.  Check your blood glucose every day, as often as told by your health care provider.  Having diabetes can put you at risk for other long-term (chronic) conditions, such as heart disease and kidney disease. Your health care provider may prescribe medicines to help prevent complications from diabetes.  Keep all follow-up visits as told by your health care provider. This is important. This information is not intended to replace advice given to you by your health care provider. Make sure you discuss any questions you have with your health care provider. Document Revised: 04/12/2018 Document Reviewed: 11/23/2015 Elsevier Patient Education  [employer].    If you have lab work done today you will be contacted with your lab results within the next 2 weeks.  If you have not heard from [place] then please contact us. The fastest way to get your results is to register for My Chart.   IF you received an x-ray today, you will receive an invoice from [facility]. Please contact [facility] at [phone] with questions or concerns regarding your invoice.   IF you received labwork today, you will receive an invoice from [facility]. Please contact [facility] at [phone] with questions or concerns regarding your invoice.   Our billing staff will not be able to assist you with questions regarding bills from these companies.  You will be contacted with the lab results as soon as they are available. The fastest way to get your results is to activate your My Chart account. Instructions are located on the last page of this paperwork. If you have not heard from [place] regarding the results in 2 weeks, please contact this office.          Signed, [name], MD [facility]

## 2020-02-22 ENCOUNTER — Ambulatory Visit: Payer: Medicare Other | Admitting: Podiatry

## 2020-02-22 ENCOUNTER — Other Ambulatory Visit: Payer: Self-pay

## 2020-02-22 ENCOUNTER — Encounter: Payer: Self-pay | Admitting: Podiatry

## 2020-02-22 DIAGNOSIS — L600 Ingrowing nail: Secondary | ICD-10-CM | POA: Diagnosis not present

## 2020-02-22 DIAGNOSIS — M79675 Pain in left toe(s): Secondary | ICD-10-CM

## 2020-02-22 DIAGNOSIS — E1142 Type 2 diabetes mellitus with diabetic polyneuropathy: Secondary | ICD-10-CM

## 2020-02-22 DIAGNOSIS — M79674 Pain in right toe(s): Secondary | ICD-10-CM

## 2020-02-22 DIAGNOSIS — B351 Tinea unguium: Secondary | ICD-10-CM | POA: Diagnosis not present

## 2020-02-22 NOTE — Patient Instructions (Addendum)
EPSOM SALT FOOT SOAK INSTRUCTIONS  Shopping List:  A. Plain epsom salt (not scented) B. Neosporin Cream/Ointment or Bacitracin Cream/Ointment C. 1-inch fabric band-aids   1.  Place 1/4 cup of epsom salts in 2 quarts of warm tap water. IF YOU ARE DIABETIC, OR HAVE NEUROPATHY, CHECK THE TEMPERATURE OF THE WATER WITH YOUR ELBOW.  2.  Submerge your foot/feet in the solution and soak for 10-15 minutes.      3.  Next, remove your foot or feet from solution, blot dry the affected area.    4.  Apply antibiotic ointment and cover with fabric band-aid .  5.  This soak should be done once a day for 7 days.   6.  Monitor for any signs/symptoms of infection such as redness, swelling, odor, drainage, increased pain, or non-healing of digit.   7.  Please do not hesitate to call the office and speak to a Nurse or Doctor if you have questions.   8.  If you experience fever, chills, nightsweats, nausea or vomiting with worsening of digit, please go to the emergency room.   Diabetes Mellitus and Foot Care Foot care is an important part of your health, especially when you have diabetes. Diabetes may cause you to have problems because of poor blood flow (circulation) to your feet and legs, which can cause your skin to:  Become thinner and drier.  Break more easily.  Heal more slowly.  Peel and crack. You may also have nerve damage (neuropathy) in your legs and feet, causing decreased feeling in them. This means that you may not notice minor injuries to your feet that could lead to more serious problems. Noticing and addressing any potential problems early is the best way to prevent future foot problems. How to care for your feet Foot hygiene  Wash your feet daily with warm water and mild soap. Do not use hot water. Then, pat your feet and the areas between your toes until they are completely dry. Do not soak your feet as this can dry your skin.  Trim your toenails straight across. Do not dig under  them or around the cuticle. File the edges of your nails with an emery board or nail file.  Apply a moisturizing lotion or petroleum jelly to the skin on your feet and to dry, brittle toenails. Use lotion that does not contain alcohol and is unscented. Do not apply lotion between your toes. Shoes and socks  Wear clean socks or stockings every day. Make sure they are not too tight. Do not wear knee-high stockings since they may decrease blood flow to your legs.  Wear shoes that fit properly and have enough cushioning. Always look in your shoes before you put them on to be sure there are no objects inside.  To break in new shoes, wear them for just a few hours a day. This prevents injuries on your feet. Wounds, scrapes, corns, and calluses  Check your feet daily for blisters, cuts, bruises, sores, and redness. If you cannot see the bottom of your feet, use a mirror or ask someone for help.  Do not cut corns or calluses or try to remove them with medicine.  If you find a minor scrape, cut, or break in the skin on your feet, keep it and the skin around it clean and dry. You may clean these areas with mild soap and water. Do not clean the area with peroxide, alcohol, or iodine.  If you have a wound, scrape, corn,  or callus on your foot, look at it several times a day to make sure it is healing and not infected. Check for: ? Redness, swelling, or pain. ? Fluid or blood. ? Warmth. ? Pus or a bad smell. General instructions  Do not cross your legs. This may decrease blood flow to your feet.  Do not use heating pads or hot water bottles on your feet. They may burn your skin. If you have lost feeling in your feet or legs, you may not know this is happening until it is too late.  Protect your feet from hot and cold by wearing shoes, such as at the beach or on hot pavement.  Schedule a complete foot exam at least once a year (annually) or more often if you have foot problems. If you have foot  problems, report any cuts, sores, or bruises to your health care provider immediately. Contact a health care provider if:  You have a medical condition that increases your risk of infection and you have any cuts, sores, or bruises on your feet.  You have an injury that is not healing.  You have redness on your legs or feet.  You feel burning or tingling in your legs or feet.  You have pain or cramps in your legs and feet.  Your legs or feet are numb.  Your feet always feel cold.  You have pain around a toenail. Get help right away if:  You have a wound, scrape, corn, or callus on your foot and: ? You have pain, swelling, or redness that gets worse. ? You have fluid or blood coming from the wound, scrape, corn, or callus. ? Your wound, scrape, corn, or callus feels warm to the touch. ? You have pus or a bad smell coming from the wound, scrape, corn, or callus. ? You have a fever. ? You have a red line going up your leg. Summary  Check your feet every day for cuts, sores, red spots, swelling, and blisters.  Moisturize feet and legs daily.  Wear shoes that fit properly and have enough cushioning.  If you have foot problems, report any cuts, sores, or bruises to your health care provider immediately.  Schedule a complete foot exam at least once a year (annually) or more often if you have foot problems. This information is not intended to replace advice given to you by your health care provider. Make sure you discuss any questions you have with your health care provider. Document Revised: 07/13/2019 Document Reviewed: 11/21/2016 Elsevier Patient Education  [place]? An infection that lies within the keratin of your nail plate that is caused by a fungus.  WHY ME? Fungal infections affect all ages, sexes, races, and creeds.  There may be many factors that predispose you to a fungal infection such as age, coexisting medical  conditions such as diabetes, or an autoimmune disease; stress, medications, fatigue, genetics, etc.  Bottom line: fungus thrives in a warm, moist environment and your shoes offer such a location.  IS IT CONTAGIOUS? Theoretically, yes.  You do not want to share shoes, nail clippers or files with someone who has fungal toenails.  Walking around barefoot in the same room or sleeping in the same bed is unlikely to transfer the organism.  It is important to realize, however, that fungus can spread easily from one nail to the next on the same foot.  HOW DO WE TREAT THIS?  There are several  ways to treat this condition.  Treatment may depend on many factors such as age, medications, pregnancy, liver and kidney conditions, etc.  It is best to ask your doctor which options are available to you.  5. No treatment.   Unlike many other medical concerns, you can live with this condition.  However for many people this can be a painful condition and may lead to ingrown toenails or a bacterial infection.  It is recommended that you keep the nails cut short to help reduce the amount of fungal nail. 6. Topical treatment.  These range from herbal remedies to prescription strength nail lacquers.  About 40-50% effective, topicals require twice daily application for approximately 9 to 12 months or until an entirely new nail has grown out.  The most effective topicals are medical grade medications available through physicians offices. 7. Oral antifungal medications.  With an 80-90% cure rate, the most common oral medication requires 3 to 4 months of therapy and stays in your system for a year as the new nail grows out.  Oral antifungal medications do require blood work to make sure it is a safe drug for you.  A liver function panel will be performed prior to starting the medication and after the first month of treatment.  It is important to have the blood work performed to avoid any harmful side effects.  In general, this  medication safe but blood work is required. 8. Laser Therapy.  This treatment is performed by applying a specialized laser to the affected nail plate.  This therapy is noninvasive, fast, and non-painful.  It is not covered by insurance and is therefore, out of pocket.  The results have been very good with a 80-95% cure rate.  The [facility] is the only practice in the area to offer this therapy. 9. Permanent Nail Avulsion.  Removing the entire nail so that a new nail will not grow back.

## 2020-02-26 NOTE — Progress Notes (Signed)
Subjective: Edward Briggs presents today for follow up of preventative diabetic foot care and painful mycotic nails b/l that are difficult to trim. Pain interferes with ambulation. Aggravating factors include wearing enclosed shoe gear. Pain is relieved with periodic professional debridement.  He states he fractured his left great toe several weeks ago. He was evaluated by his PCP and will follow up with him on next week for next xray.  He also notes painful right hallux and suspects ingrown toenail.    No Known Allergies   Objective: There were no vitals filed for this visit.  Pt 69 y.o. year old AA male WD, WN in NAD. AAO x 3.   Vascular Examination:  Capillary refill time to digits immediate b/l. Palpable DP pulses b/l. Faintly palpable PT pulses b/l. Pedal hair sparse b/l. Skin temperature gradient within normal limits b/l. No edema noted b/l.  Dermatological Examination: Pedal skin with normal turgor, texture and tone bilaterally. No open wounds bilaterally. No interdigital macerations bilaterally. Toenails 1-5 b/l elongated, dystrophic, thickened, crumbly with subungual debris and tenderness to dorsal palpation.   Incurvated nailplate right great toe b/l border(s) with tenderness to palpation. No erythema, no edema, no drainage noted.  Musculoskeletal: Normal muscle strength 5/5 to all lower extremity muscle groups bilaterally, no pain crepitus or joint limitation noted with ROM b/l, bunion deformity noted b/l and patient ambulates independent of any assistive aids  Neurological: Protective sensation intact 5/5 intact bilaterally with 10g monofilament b/l. Vibratory sensation intact b/l. Proprioception intact bilaterally. Babinski reflex negative b/l. Clonus negative b/l.  Assessment: 1. Pain due to onychomycosis of toenails of both feet   2. Ingrown toenail without infection   3. Diabetic peripheral neuropathy associated with type 2 diabetes mellitus ([place])    Plan: -Continue  diabetic foot care principles. Literature dispensed on today.  -Toenails 1-5 b/l were debrided in length and girth with sterile nail nippers and dremel without iatrogenic bleeding.  -Pt unable to tolerate debridement without local anesthetic. 3cc's 50/50 mix of 2% lidocaine plain and 0.5% marcaine plain administered via hallux block. Offending nail borders debrided and curretaged right hallux. Border cleansed with alcohol and triple antibiotic applied. Dispensed written instructions for epsom salt/warm water soaks.  -Patient to continue soft, supportive shoe gear daily. -Patient to report any pedal injuries to medical professional immediately. -Patient/POA to call should there be question/concern in the interim.  Return in about 3 months (around 05/23/2020) for diabetic nail trim.

## 2020-03-01 ENCOUNTER — Ambulatory Visit (INDEPENDENT_AMBULATORY_CARE_PROVIDER_SITE_OTHER): Payer: Medicare Other

## 2020-03-01 ENCOUNTER — Other Ambulatory Visit: Payer: Self-pay

## 2020-03-01 ENCOUNTER — Encounter: Payer: Self-pay | Admitting: Family Medicine

## 2020-03-01 ENCOUNTER — Ambulatory Visit (INDEPENDENT_AMBULATORY_CARE_PROVIDER_SITE_OTHER): Payer: Medicare Other | Admitting: Family Medicine

## 2020-03-01 VITALS — BP 123/80 | HR 84 | Temp 98.7°F | Ht 73.0 in | Wt 271.0 lb

## 2020-03-01 DIAGNOSIS — S92425A Nondisplaced fracture of distal phalanx of left great toe, initial encounter for closed fracture: Secondary | ICD-10-CM

## 2020-03-01 DIAGNOSIS — S92425D Nondisplaced fracture of distal phalanx of left great toe, subsequent encounter for fracture with routine healing: Secondary | ICD-10-CM

## 2020-03-01 DIAGNOSIS — S92412D Displaced fracture of proximal phalanx of left great toe, subsequent encounter for fracture with routine healing: Secondary | ICD-10-CM | POA: Diagnosis not present

## 2020-03-01 DIAGNOSIS — E1165 Type 2 diabetes mellitus with hyperglycemia: Secondary | ICD-10-CM | POA: Diagnosis not present

## 2020-03-01 DIAGNOSIS — Z794 Long term (current) use of insulin: Secondary | ICD-10-CM

## 2020-03-01 MED ORDER — LANTUS SOLOSTAR 100 UNIT/ML ~~LOC~~ SOPN: 26.0000 [IU] | PEN_INJECTOR | Freq: Every day | SUBCUTANEOUS | 1 refills | Status: DC

## 2020-03-01 NOTE — Progress Notes (Signed)
Subjective:  Patient ID: Edward Briggs, male    DOB: 1950/12/03  Age: 69 y.o. MRN: [id number]  CC:  Chief Complaint  Patient presents with  . Follow-up    on toe fracture. pt states it is feeling alot better than it was last visit, but still causes some pain. pt has done well to ware boot brace as offten as possible especily when pt gos out.    HPI Edward Briggs presents for   Nondisplaced fracture of distal phalanx of left great toe: Date of injury approximately March 11, initially noted on x-ray March 18.  Placed in cam walker.  Clinically improving at last visit, not needing significant pain medication.  Was using cam walker with activity outside the house.  Regular shoes at home.  Repeat x-ray showed radiographic healing as well as notation of possible small proximal phalanx great toe fracture also stable.  Transition to buddy taping with hard soled shoe.  Ankle range of motion recommended.  Min discomfort. Has been buddy taped, wearing boot still most of the time.  Ankle ROM few times per day.   Diabetes: Complicated by hyperglycemia, on insulin.  Persistent elevated readings at April 18 visit, 2-hour postprandials 179.  Was continue Metformin 850 mg twice daily, Lantus 26 units at that time.  Option of slight increased dose to 28 units if persistent elevated readings with hypoglycemia precautions. Still on 26 units.  Fasting:  150-160. Lowest 132.  2hr PP: 170.    Lab Results  Component Value Date   HGBA1C 8.8 (H) 01/19/2020   HGBA1C 8.6 (H) 10/20/2019   HGBA1C 9.4 (H) 07/28/2019   Lab Results  Component Value Date   MICROALBUR 0.7 02/20/2015   LDLCALC 84 01/19/2020   CREATININE 0.99 01/19/2020     History Patient Active Problem List   Diagnosis Date Noted  . HTN (hypertension) 07/22/2019  . Acute metabolic encephalopathy 78/24/2353  . AKI (acute kidney injury) ([place]) 07/22/2019  . Sepsis ([place]) 07/22/2019  . UTI (urinary tract infection) 07/22/2019  . Diabetes  mellitus without complication ([place]) 61/44/3154  . CHEST PAIN 07/21/2009   Past Medical History:  Diagnosis Date  . Diabetes mellitus without complication ([place])   . GERD (gastroesophageal reflux disease)    no meds   Past Surgical History:  Procedure Laterality Date  . ABSCESS DRAINAGE    . FRACTURE SURGERY    . HARDWARE REMOVAL  03/26/2012   Procedure: HARDWARE REMOVAL;  Surgeon: [name], MD;  Location: [facility];  Service: Orthopedics;  Laterality: Left;  hardware removal deep left ankle syndesmotic screws only and stress xrays ankle   . ORIF ANKLE FRACTURE  12/03/2011   Procedure: OPEN REDUCTION INTERNAL FIXATION (ORIF) ANKLE FRACTURE;  Surgeon: [name], MD;  Location: [facility];  Service: Orthopedics;  Laterality: Left;  left medial malleolus fracture  . PILONIDAL CYST EXCISION  ~1982  . SKIN GRAFT  ~1970   Lt great toe (graft from Lt thigh)   No Known Allergies Prior to Admission medications   Medication Sig Start Date End Date Taking? Authorizing Provider  Ascorbic Acid (VITAMIN C PO) Take 1 tablet by mouth daily.    Yes [provider]  aspirin 81 MG tablet Take 81 mg by mouth daily.   Yes [provider]  atorvastatin (LIPITOR) 10 MG tablet Take 1 tablet (10 mg total) by mouth once a week. 01/19/20  Yes [name], MD  BD PEN NEEDLE  NANO U/F 32G X 4 MM MISC USE DAILY AS DIRECTED 01/15/20  Yes [name], MD  blood glucose meter kit and supplies Dispense based on patient and insurance preference. Use up to four times daily as directed. (FOR ICD-10 E10.9, E11.9). 07/25/19  Yes [name], MD  Blood Glucose Monitoring Suppl (BLOOD GLUCOSE METER) kit Use as instructed 09/10/12  Yes [name], MD  glucose blood (ACCU-CHEK AVIVA) test strip Use as instructed-  2-3 time a day 04/21/19  Yes [name], MD  LANTUS SOLOSTAR 100 UNIT/ML Solostar Pen INJECT 10 UNITS INTO THE SKIN DAILY. 12/23/19  Yes  [name], MD  lisinopril (ZESTRIL) 5 MG tablet Take 1 tablet (5 mg total) by mouth daily. 10/20/19  Yes [name], MD  metFORMIN (GLUCOPHAGE) 850 MG tablet Take 1 tablet (850 mg total) by mouth 2 (two) times daily with a meal. 02/09/20  Yes [name], MD   Social History   Socioeconomic History  . Marital status: Married    Spouse name: Not on file  . Number of children: 2  . Years of education: Not on file  . Highest education level: Not on file  Occupational History  . Not on file  Tobacco Use  . Smoking status: Former Smoker    Quit date: 10/30/1988    Years since quitting: 31.3  . Smokeless tobacco: Never Used  Substance and Sexual Activity  . Alcohol use: Yes    Alcohol/week: 1.0 standard drinks    Types: 1 Shots of liquor per week    Comment: every two weeks  . Drug use: No  . Sexual activity: Yes    Birth control/protection: None  Other Topics Concern  . Not on file  Social History Narrative  . Not on file   Social Determinants of Health   Financial Resource Strain:   . Difficulty of Paying Living Expenses:   Food Insecurity:   . Worried About Charity fundraiser in the Last Year:   . Arboriculturist in the Last Year:   Transportation Needs:   . Film/video editor (Medical):   [name] Lack of Transportation (Non-Medical):   Physical Activity:   . Days of Exercise per Week:   . Minutes of Exercise per Session:   Stress:   . Feeling of Stress :   Social Connections:   . Frequency of Communication with Friends and Family:   . Frequency of Social Gatherings with Friends and Family:   . Attends Religious Services:   . Active Member of Clubs or Organizations:   . Attends Archivist Meetings:   [name] Marital Status:   Intimate Partner Violence:   . Fear of Current or Ex-Partner:   . Emotionally Abused:   [name] Physically Abused:   . Sexually Abused:     Review of Systems Per HPI.   Objective:   Vitals:   03/01/20 1109  BP:  123/80  Pulse: 84  Temp: 98.7 F (37.1 C)  TempSrc: Temporal  SpO2: 97%  Weight: 271 lb (122.9 kg)  Height: 6' 1"  (1.854 m)     Physical Exam Constitutional:      General: He is not in acute distress.    Appearance: He is well-developed.  HENT:     Head: Normocephalic and atraumatic.  Cardiovascular:     Rate and Rhythm: Normal rate.  Pulmonary:     Effort: Pulmonary effort is normal.  Musculoskeletal:  Comments: Left great toe: Skin intact. Min ttp at tip of toe only, other bones nontender. Slight stiffness, but able to bend slightly against resistance.  Slight wet appearing skin between the great toe and second toe without true breakdown. No gauze was present with buddy taping.  Neurological:     Mental Status: He is alert and oriented to person, place, and time.     DG Toe Great Left  Result Date: 03/01/2020 CLINICAL DATA:  Recent fracture of first toe EXAM: LEFT FOOT: 3 V COMPARISON:  February 09, 2020 FINDINGS: Frontal, oblique, and lateral views obtained. A fracture along the lateral proximal aspect of the first proximal phalanx shows healing response with callus formation. Alignment is essentially anatomic in this region. A fracture, obliquely oriented, in the proximal to mid portions of the first proximal phalanx shows subtle sclerosis/callus formation. Alignment remains anatomic in this area. No new fracture evident. No dislocation. No appreciable joint space narrowing or erosion. There is pes planus. There are posterior and inferior calcaneal spurs. There is postoperative change in the medial malleolus and distal fibula, stable. IMPRESSION: Fractures of the first proximal phalanx show evidence of healing in essentially anatomic alignment. No new fracture. No dislocation. Postoperative changes in the ankle region noted. There are calcaneal spurs. Electronically Signed   By: Lowella Grip III M.D.   On: 03/01/2020 11:59   DG Toe Great Left  Result Date:  02/09/2020 CLINICAL DATA:  Follow-up left great toe fracture EXAM: LEFT GREAT TOE COMPARISON:  01/19/2020 FINDINGS: Interval development of sclerotic healing of a very subtle nondisplaced fracture of the base of the left great toe distal phalanx, seen only on lateral view. Suspect an additional, partially callused and healing nondisplaced oblique fracture through the proximal phalanx of the great, which is now easier to see with absorptive early healing. Screw fixation of ankle fractures, partially included. IMPRESSION: 1. Interval development of sclerotic healing of a very subtle nondisplaced fracture of the base of the left great toe distal phalanx, seen only on lateral view. 2. Suspect an additional, partially callused and healing nondisplaced oblique fracture through the proximal phalanx of the great, which is now easier to see with absorptive early healing. Electronically Signed   By: Eddie Candle M.D.   On: 02/09/2020 11:46      Assessment & Plan:  Edward Briggs is a 69 y.o. male . Nondisplaced fracture of distal phalanx of left great toe, initial encounter for closed fracture - Plan: DG Toe Great Left  - healing. Subtle callous on one area above. Extend buddy taping for 2-3 more weeks.  [facility] office visit in 4 weeks.  Stressed importance of gauze between toes and allow air to the area when not weightbearing.  CAM Walker or hard soled shoe with ambulation.  Type 2 diabetes mellitus with hyperglycemia, with long-term current use of insulin (HCC) - Plan: insulin glargine (LANTUS SOLOSTAR) 100 UNIT/ML Solostar Pen  -Borderline control, option of 28 units if higher readings, but okay to remain at 26 units for now.  Hypoglycemic precautions discussed.  Plan on A1c in June.  No orders of the defined types were placed in this encounter.  There are no Patient Instructions on file for this visit.    Signed, [name], MD [facility]

## 2020-03-01 NOTE — Patient Instructions (Signed)
Continue buddy taping for 2-3 more weeks, hard sole shoe or cam walker, then if pain resolved remove splint. Make sure to put gauze between toes when buddy taping. Ok to leave toes open to ear at times if you are not walking or putting weight on them.

## 2020-03-02 ENCOUNTER — Telehealth: Payer: Self-pay | Admitting: Family Medicine

## 2020-03-02 ENCOUNTER — Other Ambulatory Visit: Payer: Self-pay

## 2020-03-02 DIAGNOSIS — E1165 Type 2 diabetes mellitus with hyperglycemia: Secondary | ICD-10-CM

## 2020-03-02 DIAGNOSIS — Z794 Long term (current) use of insulin: Secondary | ICD-10-CM

## 2020-03-02 NOTE — Telephone Encounter (Signed)
Medication Refill - Medication: glucose blood (ACCU-CHEK AVIVA) test strip    Has the patient contacted their pharmacy? No. (Agent: If no, request that the patient contact the pharmacy for the refill.) (Agent: If yes, when and what did the pharmacy advise?)  Preferred Pharmacy (with phone number or street name):  [facility] ([place]), [place] - [address]  [address] [facility]) [place] [address]  Phone:  [phone] Fax:  [phone]   Agent: Please be advised that RX refills may take up to 3 business days. We ask that you follow-up with your pharmacy.

## 2020-03-02 NOTE — Telephone Encounter (Signed)
Unable to reorder test strips. Message pops up that a new order needs to be placed.

## 2020-03-02 NOTE — Telephone Encounter (Signed)
Pt called requesting theyre blood glucose test strips but it is saying I cannot reorder but that it needs a brand new order. Could you do this and send it to the [facility] on [address]

## 2020-03-03 MED ORDER — GLUCOSE BLOOD VI STRP: ORAL_STRIP | 4 refills | Status: DC

## 2020-03-03 NOTE — Telephone Encounter (Signed)
Done

## 2020-03-07 ENCOUNTER — Other Ambulatory Visit: Payer: Self-pay | Admitting: Family Medicine

## 2020-03-07 NOTE — Telephone Encounter (Signed)
Returned patient's call regarding glucose monitoring supplies , he states he checks blood sugars 2 to 3 times daily and will be calling back with brand required by the insurance .

## 2020-03-07 NOTE — Telephone Encounter (Signed)
Pt called regarding the telephone in counter on 03/02/20. Stating pt needs the Blood Glucose proscriptions sent in stating how often Pt needs to take them so insurance can cover it. To the [facility] [address] Please advise.

## 2020-03-13 ENCOUNTER — Other Ambulatory Visit: Payer: Self-pay

## 2020-03-13 ENCOUNTER — Telehealth: Payer: Self-pay | Admitting: Family Medicine

## 2020-03-13 DIAGNOSIS — Z794 Long term (current) use of insulin: Secondary | ICD-10-CM

## 2020-03-13 MED ORDER — GLUCOSE BLOOD VI STRP: ORAL_STRIP | 4 refills | Status: DC

## 2020-03-13 NOTE — Telephone Encounter (Signed)
Error

## 2020-03-29 ENCOUNTER — Ambulatory Visit (INDEPENDENT_AMBULATORY_CARE_PROVIDER_SITE_OTHER): Payer: Medicare Other | Admitting: Family Medicine

## 2020-03-29 ENCOUNTER — Ambulatory Visit (INDEPENDENT_AMBULATORY_CARE_PROVIDER_SITE_OTHER): Payer: Medicare Other

## 2020-03-29 ENCOUNTER — Encounter: Payer: Self-pay | Admitting: Family Medicine

## 2020-03-29 ENCOUNTER — Other Ambulatory Visit: Payer: Self-pay

## 2020-03-29 VITALS — BP 128/83 | HR 75 | Temp 98.0°F | Ht 73.0 in | Wt 271.0 lb

## 2020-03-29 DIAGNOSIS — S92415D Nondisplaced fracture of proximal phalanx of left great toe, subsequent encounter for fracture with routine healing: Secondary | ICD-10-CM

## 2020-03-29 DIAGNOSIS — S92412D Displaced fracture of proximal phalanx of left great toe, subsequent encounter for fracture with routine healing: Secondary | ICD-10-CM | POA: Diagnosis not present

## 2020-03-29 NOTE — Progress Notes (Signed)
Subjective:  Patient ID: Edward Briggs, male    DOB: 05-02-1951  Age: 69 y.o. MRN: [id number]  CC:  Chief Complaint  Patient presents with  . L great Toe Fx    follow up     HPI [name] presents for   Left great toe fractures.   Initial date of injury approximately March 11.  Placed in cam walker March 18 for distal great toe fracture.  Repeat x-ray showed radiographic healing as well as notation of other small proximal phalanx great toe fracture which was also stable.  Most recently evaluated April 2019, with continuing to wear boot most of the time as well as buddy taping.  Ankle range of motion.  Imaging showed evidence of healing in essentially anatomic alignment.  Lateral proximal first proximal phalanx with callus formation, oblique fracture proximal to the midportion of the first proximal phalanx with subtle sclerosis/callus formation.  Minimal tenderness to palpation on exam.  With continued need for buddy taping for 2-3 more weeks with hard soled shoe.  Still buddy taping. Min soreness. Not affecting walking, no pain meds needed, wearing regular shoe.     History Patient Active Problem List   Diagnosis Date Noted  . HTN (hypertension) 07/22/2019  . Acute metabolic encephalopathy 08/30/2535  . AKI (acute kidney injury) ([name]) 07/22/2019  . Sepsis ([name]) 07/22/2019  . UTI (urinary tract infection) 07/22/2019  . Diabetes mellitus without complication ([name]) 64/40/3474  . CHEST PAIN 07/21/2009   Past Medical History:  Diagnosis Date  . Diabetes mellitus without complication ([name])   . GERD (gastroesophageal reflux disease)    no meds   Past Surgical History:  Procedure Laterality Date  . ABSCESS DRAINAGE    . FRACTURE SURGERY    . HARDWARE REMOVAL  03/26/2012   Procedure: HARDWARE REMOVAL;  Surgeon: [name], MD;  Location: [facility];  Service: Orthopedics;  Laterality: Left;  hardware removal deep left ankle syndesmotic screws only and  stress xrays ankle   . ORIF ANKLE FRACTURE  12/03/2011   Procedure: OPEN REDUCTION INTERNAL FIXATION (ORIF) ANKLE FRACTURE;  Surgeon: [name], MD;  Location: [name];  Service: Orthopedics;  Laterality: Left;  left medial malleolus fracture  . PILONIDAL CYST EXCISION  ~1982  . SKIN GRAFT  ~1970   Lt great toe (graft from Lt thigh)   No Known Allergies Prior to Admission medications   Medication Sig Start Date End Date Taking? Authorizing Provider  Ascorbic Acid (VITAMIN C PO) Take 1 tablet by mouth daily.    Yes [provider]  aspirin 81 MG tablet Take 81 mg by mouth daily.   Yes [provider]  atorvastatin (LIPITOR) 10 MG tablet Take 1 tablet (10 mg total) by mouth once a week. 01/19/20  Yes [name], MD  BD PEN NEEDLE NANO U/F 32G X 4 MM MISC USE DAILY AS DIRECTED 01/15/20  Yes [name], MD  blood glucose meter kit and supplies Dispense based on patient and insurance preference. Use up to four times daily as directed. (FOR ICD-10 E10.9, E11.9). 07/25/19  Yes [name], MD  Blood Glucose Monitoring Suppl (BLOOD GLUCOSE METER) kit Use as instructed 09/10/12  Yes [name], MD  glucose blood test strip Check blood sugar 3 times daily 03/13/20  Yes [name], MD  insulin glargine (LANTUS SOLOSTAR) 100 UNIT/ML Solostar Pen Inject 26 Units into the skin daily. 03/01/20  Yes [name]  R, MD  lisinopril (ZESTRIL) 5 MG tablet Take 1 tablet (5 mg total) by mouth daily. 10/20/19  Yes [name], MD  metFORMIN (GLUCOPHAGE) 850 MG tablet Take 1 tablet (850 mg total) by mouth 2 (two) times daily with a meal. 02/09/20  Yes [name], MD   Social History   Socioeconomic History  . Marital status: Married    Spouse name: Not on file  . Number of children: 2  . Years of education: Not on file  . Highest education level: Not on file  Occupational History  . Not on file  Tobacco Use  . Smoking status:  Former Smoker    Quit date: 10/30/1988    Years since quitting: 31.4  . Smokeless tobacco: Never Used  Substance and Sexual Activity  . Alcohol use: Yes    Alcohol/week: 1.0 standard drinks    Types: 1 Shots of liquor per week    Comment: every two weeks  . Drug use: No  . Sexual activity: Yes    Birth control/protection: None  Other Topics Concern  . Not on file  Social History Narrative  . Not on file   Social Determinants of Health   Financial Resource Strain:   . Difficulty of Paying Living Expenses:   Food Insecurity:   . Worried About Charity fundraiser in the Last Year:   . Arboriculturist in the Last Year:   Transportation Needs:   . Film/video editor (Medical):   [name] Lack of Transportation (Non-Medical):   Physical Activity:   . Days of Exercise per Week:   . Minutes of Exercise per Session:   Stress:   . Feeling of Stress :   Social Connections:   . Frequency of Communication with Friends and Family:   . Frequency of Social Gatherings with Friends and Family:   . Attends Religious Services:   . Active Member of Clubs or Organizations:   . Attends Archivist Meetings:   [name] Marital Status:   Intimate Partner Violence:   . Fear of Current or Ex-Partner:   . Emotionally Abused:   [name] Physically Abused:   . Sexually Abused:     Review of Systems   Objective:   Vitals:   03/29/20 1414  BP: 128/83  Pulse: 75  Temp: 98 F (36.7 C)  SpO2: 95%  Weight: 271 lb (122.9 kg)  Height: 6' 1" (1.854 m)     Physical Exam Constitutional:      General: He is not in acute distress.    Appearance: He is well-developed.  HENT:     Head: Normocephalic and atraumatic.  Cardiovascular:     Rate and Rhythm: Normal rate.  Pulmonary:     Effort: Pulmonary effort is normal.  Musculoskeletal:     Comments: Left great toe.  Remote scar tissue dorsal aspect, pain-free range of motion.  Intact strength with dorsi and plantar flexion.  No bony tenderness.   Remainder of foot, ankle nontender with pain-free range of motion  Neurological:     Mental Status: He is alert and oriented to person, place, and time.       Assessment & Plan:  Edward EHRESMAN is a 69 y.o. male . Closed nondisplaced fracture of proximal phalanx of left great toe with routine healing, subsequent encounter - Plan: DG Toe Great Left  -Clinically healed, will check x-ray for radiographic healing, but at this time can stop buddy taping, normal shoes/socks with  RTC precautions.  No orders of the defined types were placed in this encounter.  Patient Instructions     Okay to wear regular shoes and socks at this time.  I will let you know if any concerns on x-ray.  Any return of pain, please schedule follow-up.  If you have lab work done today you will be contacted with your lab results within the next 2 weeks.  If you have not heard from [place] then please contact us. The fastest way to get your results is to register for My Chart.   IF you received an x-ray today, you will receive an invoice from [facility]. Please contact [facility] at [phone] with questions or concerns regarding your invoice.   IF you received labwork today, you will receive an invoice from [facility]. Please contact [facility] at [phone] with questions or concerns regarding your invoice.   Our billing staff will not be able to assist you with questions regarding bills from these companies.  You will be contacted with the lab results as soon as they are available. The fastest way to get your results is to activate your My Chart account. Instructions are located on the last page of this paperwork. If you have not heard from [place] regarding the results in 2 weeks, please contact this office.          Signed, [name], MD [facility]

## 2020-03-29 NOTE — Patient Instructions (Addendum)
   Okay to wear regular shoes and socks at this time.  I will let you know if any concerns on x-ray.  Any return of pain, please schedule follow-up.  If you have lab work done today you will be contacted with your lab results within the next 2 weeks.  If you have not heard from [place] then please contact us. The fastest way to get your results is to register for My Chart.   IF you received an x-ray today, you will receive an invoice from [facility]. Please contact [facility] at [phone] with questions or concerns regarding your invoice.   IF you received labwork today, you will receive an invoice from [place]. Please contact [facility] at [phone] with questions or concerns regarding your invoice.   Our billing staff will not be able to assist you with questions regarding bills from these companies.  You will be contacted with the lab results as soon as they are available. The fastest way to get your results is to activate your My Chart account. Instructions are located on the last page of this paperwork. If you have not heard from [place] regarding the results in 2 weeks, please contact this office.

## 2020-04-05 NOTE — Addendum Note (Signed)
Addended by: [name] R on: 04/05/2020 08:28 PM   Modules accepted: Level of Service

## 2020-04-27 ENCOUNTER — Other Ambulatory Visit: Payer: Self-pay | Admitting: Family Medicine

## 2020-04-27 DIAGNOSIS — E1165 Type 2 diabetes mellitus with hyperglycemia: Secondary | ICD-10-CM

## 2020-05-09 ENCOUNTER — Telehealth: Payer: Self-pay | Admitting: Family Medicine

## 2020-05-09 NOTE — Telephone Encounter (Signed)
Pt wife called and is wanting to sch pts AWV. Please advise.

## 2020-05-23 ENCOUNTER — Ambulatory Visit: Payer: Medicare Other | Admitting: Podiatry

## 2020-05-23 ENCOUNTER — Other Ambulatory Visit: Payer: Self-pay

## 2020-05-23 ENCOUNTER — Encounter: Payer: Self-pay | Admitting: Podiatry

## 2020-05-23 DIAGNOSIS — L600 Ingrowing nail: Secondary | ICD-10-CM

## 2020-05-23 DIAGNOSIS — M79675 Pain in left toe(s): Secondary | ICD-10-CM | POA: Diagnosis not present

## 2020-05-23 DIAGNOSIS — B351 Tinea unguium: Secondary | ICD-10-CM | POA: Diagnosis not present

## 2020-05-23 DIAGNOSIS — L608 Other nail disorders: Secondary | ICD-10-CM | POA: Diagnosis not present

## 2020-05-23 DIAGNOSIS — M79674 Pain in right toe(s): Secondary | ICD-10-CM | POA: Diagnosis not present

## 2020-05-23 DIAGNOSIS — E119 Type 2 diabetes mellitus without complications: Secondary | ICD-10-CM

## 2020-05-23 MED ORDER — AMOXICILLIN 500 MG PO CAPS: 500.0000 mg | ORAL_CAPSULE | Freq: Three times a day (TID) | ORAL | 0 refills | Status: AC

## 2020-05-23 NOTE — Patient Instructions (Signed)
EPSOM SALT FOOT SOAK INSTRUCTIONS  *IF YOU HAVE BEEN PRESCRIBED ANTIBIOTICS, TAKE AS INSTRUCTED UNTIL ALL ARE GONE*  Shopping List:  A. Plain epsom salt (not scented) B. Neosporin Cream/Ointment or Bacitracin Cream/Ointment (or prescribed antiobiotic drops/cream/ointment) C. 1-inch fabric band-aids  1.  Place 1/4 cup of epsom salts in 2 quarts of warm tap water. IF YOU ARE DIABETIC, OR HAVE NEUROPATHY, CHECK THE TEMPERATURE OF THE WATER WITH YOUR ELBOW.  2.  Submerge your foot/feet in the solution and soak for 10-15 minutes.      3.  Next, remove your foot/feet from solution, blot dry the affected area.    4.  Apply light amount of antibiotic cream/ointment and cover with fabric band-aid .  5.  This soak should be done once a day for 10 days.   6.  Monitor for any signs/symptoms of infection such as redness, swelling, odor, drainage, increased pain, or non-healing of digit.   7.  Please do not hesitate to call the office and speak to a Nurse or Doctor if you have questions.   8.  If you experience fever, chills, nightsweats, nausea or vomiting with worsening of digit, please go to the emergency room.   

## 2020-05-24 NOTE — Progress Notes (Signed)
Subjective:  Edward Briggs presents to clinic today with cc of painful, ingrown toenail of L hallux and R hallux.  Pain has been present for chronically for several months.  Attempted treatments include: slant back of both great toes with temporary relief of pain.  Current Outpatient Medications on File Prior to Visit  Medication Sig Dispense Refill  . Ascorbic Acid (VITAMIN C PO) Take 1 tablet by mouth daily.     [name] aspirin 81 MG tablet Take 81 mg by mouth daily.    [name] atorvastatin (LIPITOR) 10 MG tablet Take 1 tablet (10 mg total) by mouth once a week. 30 tablet 0  . BD PEN NEEDLE NANO U/F 32G X 4 MM MISC USE DAILY AS DIRECTED 100 each 2  . blood glucose meter kit and supplies Dispense based on patient and insurance preference. Use up to four times daily as directed. (FOR ICD-10 E10.9, E11.9). 1 each 0  . Blood Glucose Monitoring Suppl (BLOOD GLUCOSE METER) kit Use as instructed 1 each 0  . glucose blood test strip Check blood sugar 3 times daily 100 each 4  . LANTUS SOLOSTAR 100 UNIT/ML Solostar Pen INJECT 10 UNITS INTO THE SKIN DAILY. 15 mL 1  . lisinopril (ZESTRIL) 5 MG tablet Take 1 tablet (5 mg total) by mouth daily. 90 tablet 1  . metFORMIN (GLUCOPHAGE) 850 MG tablet Take 1 tablet (850 mg total) by mouth 2 (two) times daily with a meal. 180 tablet 1   No current facility-administered medications on file prior to visit.     No Known Allergies   Objective: Edward Briggs is a pleasant 69 y.o. male morbidly obese in NAD.[name] AAO x 3.   Vascular: Capillary refill time to digits immediate b/l. Palpable DP pulse(s) b/l lower extremities Faintly palpable PT pulse(s) b/l lower extremities. Pedal hair sparse. Lower extremity skin temperature gradient within normal limits. No edema noted b/l lower extremities. No ischemia or gangrene noted b/l lower extremities.  Dermatological: Pedal skin with normal turgor, texture and tone bilaterally. No open wounds bilaterally. No interdigital  macerations bilaterally. Toenails 1-5 b/l elongated, discolored, dystrophic, thickened, crumbly with subungual debris and tenderness to dorsal palpation. Incurvated nailplate present on both border(s) L hallux and R hallux.  Nail border hypertrophy present on both borders of bilateral great toes. There is tenderness to palpation. Sign(s) of infection: no clinical signs of infection noted on examination today. There is no drainage. Both great toes possess characteristics of pincer nail deformity.  Toenails 2-5 b/l are painful, elongated, discolored, dystrophic with subungual debris. Pain with dorsal palpation of nailplates. No erythema, no edema, no drainage noted.  He has scarring dorsal 1st MPJ from childhood lawn mower degloving accident which required skin graft.  Musculoskeletal Normal muscle strength 5/5 to all lower extremity muscle groups bilaterally. No pain crepitus or joint limitation noted with ROM b/l. Hallux valgus with bunion deformity noted b/l lower extremities. Patient ambulates independent of any assistive aids.  Neurological Protective sensation intact 5/5 intact bilaterally with 10g monofilament b/l. Vibratory sensation intact b/l. Proprioception intact bilaterally. Clonus negative b/l.  Assessment: 1. Ingrowing nail    Plan: 1. Discussed diagnosis of chronic ingrown toenails with treatment options. 2. Patient/POA agreed to have temporary total nail avulsion of L hallux and R hallux. 3. Consent form signed and in chart. 4. Prepped both great toeswith betadine. 5. No tourniquet was used for procedure of either digit. 6. Local injection of  4 cc's 50/50 mix of 2% xylocaine plain  and 0.5% marcaine plain administered via modified hallux block to L hallux taking care to avoid scar tissue from skin graft. Local injection of  5 cc's 50/50 mix of 2% xylocaine plain and 0.5% marcaine plain administered via hallux block to R hallux. Offending nail plates freed utilizing elevator.  Offending nail plates removed with hemostat. Digits cleansed with alcohol. Light bleeding controlled with Lumicain Hemostatic Solution. Topical antibiotic and light dressng applied to digit. EBL during procedure less than 1 cc for each digit. Mr. Wisenbaker tolerated procedures and local anesthesia well without complication.  7. Patient tolerated local anesthesia and procedure well with no complications. 8. Discused post-procedure instructions of epsom salt warm water soaks and dispensed written handout. 9. Rx for oral antibiotics: Amoxicillin 500 mg po tid x 7 days. 10. Patient/POA related understanding of post-procedure instructions. 11. Toenails 2-5 bilaterally debrided in length and girth without iatrogenic bleeding with sterile nail nipper and dremel.  12. Patient to report any pedal injuries to medical professional immediately. 13. Patient to continue soft, supportive shoe gear daily. 14. Patient/POA to call should there be question/concern in the interim.  Return in about 2 weeks (around 06/06/2020) for right nail check, left nail check. Marzetta Board, DPM

## 2020-06-06 ENCOUNTER — Encounter: Payer: Self-pay | Admitting: Podiatry

## 2020-06-06 ENCOUNTER — Ambulatory Visit: Payer: Medicare Other | Admitting: Podiatry

## 2020-06-06 ENCOUNTER — Other Ambulatory Visit: Payer: Self-pay

## 2020-06-06 DIAGNOSIS — L608 Other nail disorders: Secondary | ICD-10-CM

## 2020-06-06 DIAGNOSIS — L600 Ingrowing nail: Secondary | ICD-10-CM

## 2020-06-06 DIAGNOSIS — Z9889 Other specified postprocedural states: Secondary | ICD-10-CM

## 2020-06-06 NOTE — Progress Notes (Signed)
Subjective:  Edward Briggs presents today s/p temporary total nail avulsion L hallux and R hallux.  Patient states he has been following post procedure instructions. He relates toes feel better now, but still a little sore. He denies any redness, drainage or swelling of either digit. No fever, chills, night sweats, nausea or vomiting.  Objective: There were no vitals filed for this visit.  69 y.o. male obese in NAD.[name] AAO X 3.  Capillary refill time to digits immediate b/l. Palpable DP pulse(s) b/l lower extremities Faintly palpable PT pulse(s) b/l lower extremities. Pedal hair sparse. Lower extremity skin temperature gradient within normal limits. No edema noted b/l lower extremities.  Pedal skin with normal turgor, texture and tone bilaterally. No open wounds bilaterally. No interdigital macerations bilaterally. Procedure site of L hallux and R hallux noted to be healing as expected with no erythema, no edema, no drainage, no purulence.  Normal muscle strength 5/5 to all lower extremity muscle groups bilaterally. No pain crepitus or joint limitation noted with ROM b/l. Hallux valgus with bunion deformity noted b/l lower extremities. Patient ambulates independent of any assistive aids.  Protective sensation intact 5/5 intact bilaterally with 10g monofilament b/l. Vibratory sensation intact b/l.  Assessment: s/p temporary total nail avulsion L hallux and R hallux  Ingrowing toenail  Plan:  -Examined patient. -Patient to report any pedal injuries to medical professional immediately. -Procedure sites gently cleaned with currette. Cleansed with alcohol. TAO and band-aid applied. -He was instructed to apply TAO to both great toes once daily. -Patient instructed to discontinue epsom salt soaks and start applying topical antibiotic to nailbed once daily for an additional week, then stop. -Patient to continue soft, supportive shoe gear daily. -Patient/POA to call should there be  question/concern in the interim.  Return in about 10 weeks (around 08/15/2020) for nail trim.

## 2020-08-15 ENCOUNTER — Ambulatory Visit: Payer: Medicare Other | Admitting: Podiatry

## 2020-08-15 ENCOUNTER — Other Ambulatory Visit: Payer: Self-pay

## 2020-08-15 DIAGNOSIS — M79674 Pain in right toe(s): Secondary | ICD-10-CM

## 2020-08-15 DIAGNOSIS — B351 Tinea unguium: Secondary | ICD-10-CM

## 2020-08-15 DIAGNOSIS — M79675 Pain in left toe(s): Secondary | ICD-10-CM

## 2020-08-15 DIAGNOSIS — E119 Type 2 diabetes mellitus without complications: Secondary | ICD-10-CM

## 2020-08-15 DIAGNOSIS — Z9889 Other specified postprocedural states: Secondary | ICD-10-CM

## 2020-08-15 NOTE — Progress Notes (Addendum)
Subjective: Edward Briggs presents today for follow up of preventative diabetic foot care and painful mycotic nails b/l that are difficult to trim. Pain interferes with ambulation. Aggravating factors include wearing enclosed shoe gear. Pain is relieved with periodic professional debridement.  He is s/p temporary total nail avulsion of both great toes on 05/23/2020. He states he has hard, dry skin on nail beds.   He voices no new pedal concerns on today's visit. States his blood glucose was 138 mg/dl today. PCP is Dr. [name] and last visit was 05/09/2020.  No Known Allergies   Objective: There were no vitals filed for this visit.  Edward Briggs is a pleasant 69 y.o. year old AA male WD, WN in NAD. AAO x 3.   Vascular Examination:  Capillary refill time to digits immediate b/l. Palpable DP pulses b/l. Faintly palpable PT pulses b/l. Pedal hair sparse b/l. Skin temperature gradient within normal limits b/l. No edema noted b/l.  Dermatological Examination: Pedal skin with normal turgor, texture and tone bilaterally. No open wounds bilaterally. No interdigital macerations bilaterally. Toenails 2-5 bilaterally elongated, discolored, dystrophic, thickened, and crumbly with subungual debris and tenderness to dorsal palpation.   Evidence of total nail avulsion b/l hallux. Nailbed with dried serosanguinous drainage. No underlying SOI.    Musculoskeletal: Normal muscle strength 5/5 to all lower extremity muscle groups bilaterally, no pain crepitus or joint limitation noted with ROM b/l, bunion deformity noted b/l and patient ambulates independent of any assistive aids  Neurological: Protective sensation intact 5/5 intact bilaterally with 10g monofilament b/l. Vibratory sensation intact b/l. Proprioception intact bilaterally. Babinski reflex negative b/l. Clonus negative b/l.  Assessment: 1. Pain due to onychomycosis of toenails of both feet   2. Status post nail surgery   3. Diabetes mellitus  without complication ([place])     Plan: -Continue diabetic foot care principles. Literature dispensed on today.  -Toenails 2-5 b/l were debrided in length and girth with sterile nail nippers and dremel without iatrogenic bleeding.   -Bilateral hallux nail beds debrided of dried serosanguinous drainage. TAO applied to nailbeds. He was advised to apply Vaseline Petroleum Jelly to nailbeds once daily. -Patient to continue soft, supportive shoe gear daily. -Patient to report any pedal injuries to medical professional immediately. -Patient/POA to call should there be question/concern in the interim.  No follow-ups on file.

## 2020-08-19 ENCOUNTER — Encounter: Payer: Self-pay | Admitting: Podiatry

## 2020-08-21 ENCOUNTER — Telehealth: Payer: Self-pay | Admitting: *Deleted

## 2020-08-21 NOTE — Telephone Encounter (Signed)
Please schedule patient for a f/u diabetic check Dr. [name].  Patient is also due for AWV

## 2020-09-08 ENCOUNTER — Other Ambulatory Visit: Payer: Self-pay | Admitting: Family Medicine

## 2020-09-08 DIAGNOSIS — E1165 Type 2 diabetes mellitus with hyperglycemia: Secondary | ICD-10-CM

## 2020-09-08 NOTE — Telephone Encounter (Signed)
Requested medication (s) are due for refill today: yes  Requested medication (s) are on the active medication list: yes  Last refill:  04/27/20  Future visit scheduled: no  Notes to clinic:  overdue lab work   Requested Prescriptions  Pending Prescriptions Disp Refills   LANTUS SOLOSTAR 100 UNIT/ML Solostar Pen [Pharmacy Med Name: LANTUS SOLOSTAR 100 UNIT/ML] 15 mL 1    Sig: INJECT 10 UNITS INTO THE SKIN DAILY.      Endocrinology:  Diabetes - Insulins Failed - 09/08/2020  5:12 PM      Failed - HBA1C is between 0 and 7.9 and within 180 days    Hgb A1c MFr Bld  Date Value Ref Range Status  01/19/2020 8.8 (H) 4.8 - 5.6 % Final    Comment:             Prediabetes: 5.7 - 6.4          Diabetes: >6.4          Glycemic control for adults with diabetes: <7.0           Passed - Valid encounter within last 6 months    Recent Outpatient Visits           5 months ago Closed nondisplaced fracture of proximal phalanx of left great toe with routine healing, subsequent encounter   [facility], [name], MD   6 months ago Nondisplaced fracture of distal phalanx of left great toe, initial encounter for closed fracture   [facility], [name], MD   7 months ago Nondisplaced fracture of distal phalanx of left great toe, initial encounter for closed fracture   [facility], [name], MD   7 months ago Type 2 diabetes mellitus with hyperglycemia, with long-term current use of insulin [facility])   [facility], [name], MD   10 months ago Type 2 diabetes mellitus with hyperglycemia, with long-term current use of insulin [facility])   [facility], [name], MD

## 2020-11-21 ENCOUNTER — Ambulatory Visit: Payer: Medicare Other | Admitting: Podiatry

## 2021-01-18 ENCOUNTER — Ambulatory Visit: Payer: Medicare Other | Admitting: Podiatry

## 2021-01-18 ENCOUNTER — Encounter: Payer: Self-pay | Admitting: Podiatry

## 2021-01-18 ENCOUNTER — Other Ambulatory Visit: Payer: Self-pay

## 2021-01-18 DIAGNOSIS — E119 Type 2 diabetes mellitus without complications: Secondary | ICD-10-CM | POA: Diagnosis not present

## 2021-01-18 DIAGNOSIS — M79675 Pain in left toe(s): Secondary | ICD-10-CM

## 2021-01-18 DIAGNOSIS — B351 Tinea unguium: Secondary | ICD-10-CM

## 2021-01-18 DIAGNOSIS — Z9889 Other specified postprocedural states: Secondary | ICD-10-CM

## 2021-01-18 DIAGNOSIS — M79674 Pain in right toe(s): Secondary | ICD-10-CM

## 2021-01-23 IMAGING — DX DG TOE GREAT 2+V*L*
3 series · 3 of 3 positions shown · non-contrast
Comparison: 03/01/2020

CLINICAL DATA: Follow-up fracture.

EXAM:
LEFT GREAT TOE

[toe ap]
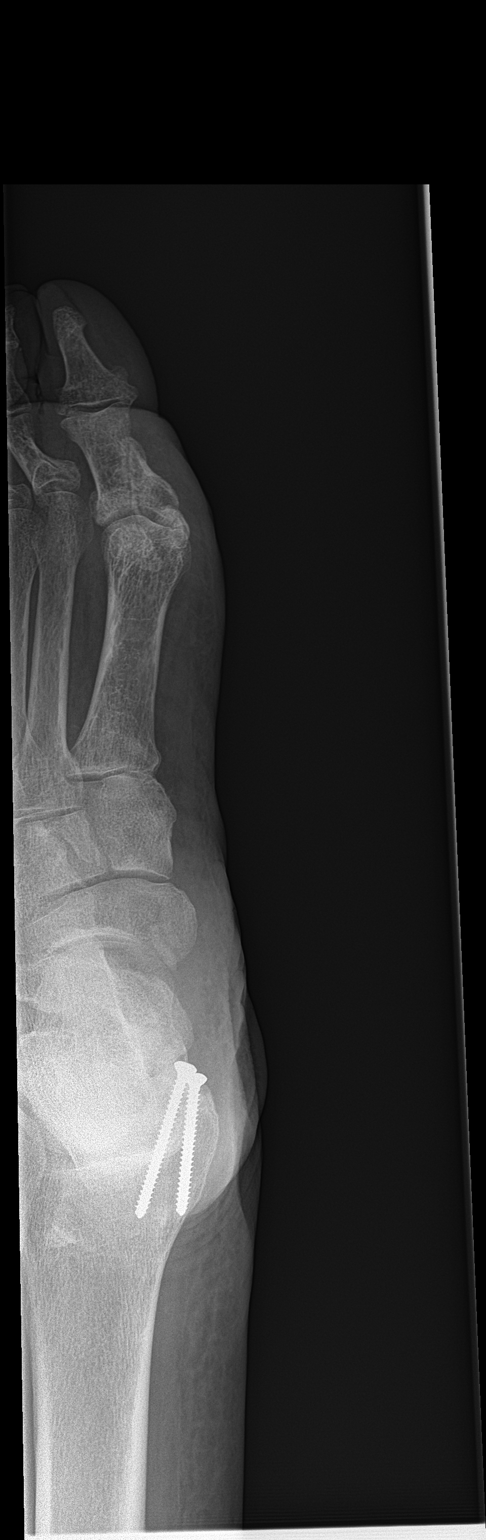

[toe obl]
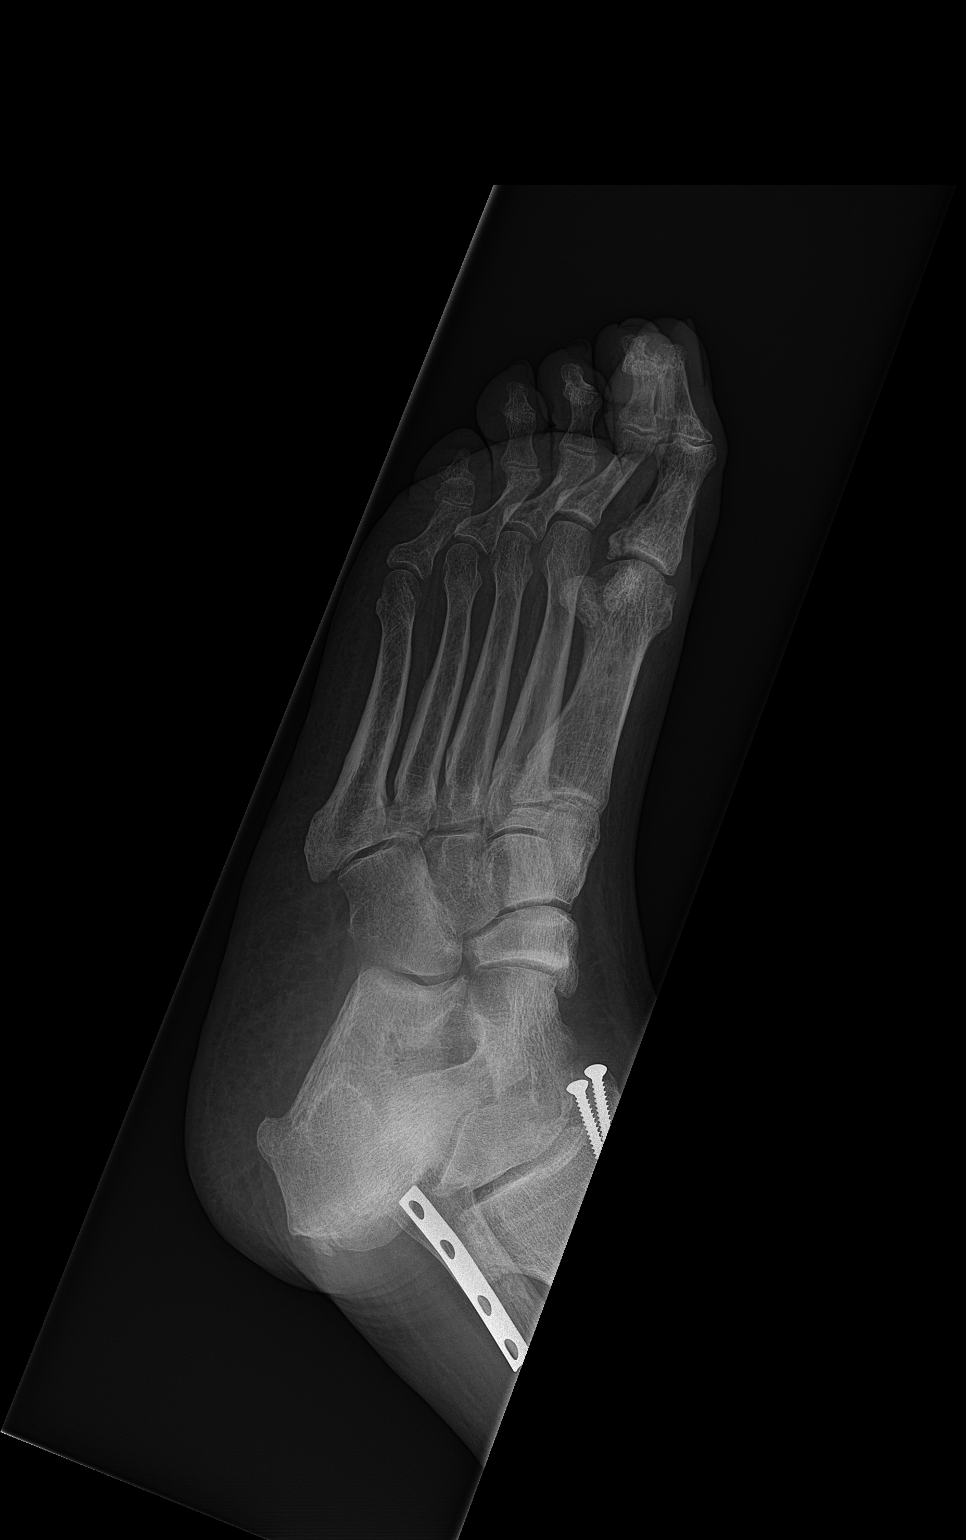

[toe lat]
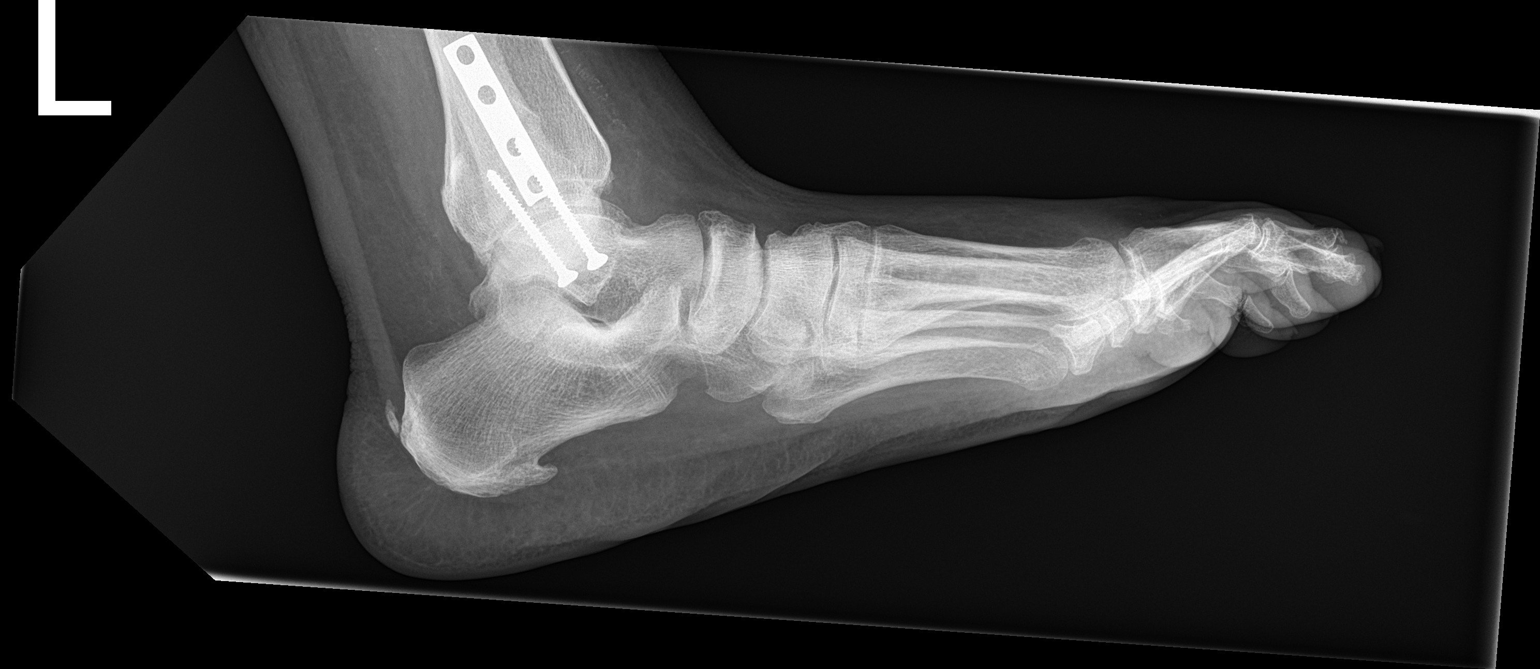

[3 of 3 positions shown; findings below may reference images not displayed]

FINDINGS: Evidence of patient's known first proximal phalanx fracture with
mild residual lucency over the fracture site. Mild bridging callus
formation is present. No significant displacement. Remainder the
exam is unchanged.
IMPRESSION: Mild interval healing of patient's first proximal phalanx fracture.

## 2021-01-25 ENCOUNTER — Telehealth: Payer: Self-pay | Admitting: Family Medicine

## 2021-01-25 NOTE — Telephone Encounter (Signed)
When does pt need to be seen for a f/up appt with Dr. [name]?  Please advise   Vickii Chafe called in asking when and if pt will be needing bw

## 2021-01-26 NOTE — Progress Notes (Signed)
Subjective: Edward Briggs presents today for follow up of preventative diabetic foot care and painful mycotic nails b/l that are difficult to trim. Pain interferes with ambulation. Aggravating factors include wearing enclosed shoe gear. Pain is relieved with periodic professional debridement.  His wife is present during today's visit. They note no new pedal concerns. He continues to apply Vaseline to skin around nails daily  States his blood glucose was 199 mg/dl today. PCP is Dr. [name] and last visit was 03/29/2020.  No Known Allergies   Objective: There were no vitals filed for this visit.  Edward Briggs is a pleasant 70 y.o. year old AA male WD, WN in NAD. AAO x 3.   Vascular Examination:  Capillary refill time to digits immediate b/l. Palpable DP pulses b/l. Faintly palpable PT pulses b/l. Pedal hair sparse b/l. Skin temperature gradient within normal limits b/l. No edema noted b/l.  Dermatological Examination: Pedal skin with normal turgor, texture and tone bilaterally. No open wounds bilaterally. No interdigital macerations bilaterally. Toenails 2-5 bilaterally elongated, discolored, dystrophic, thickened, and crumbly with subungual debris and tenderness to dorsal palpation.   Evidence of total nail avulsion b/l hallux and nails growing in nicely proximal 1/2 of nailbeds.  Musculoskeletal: Normal muscle strength 5/5 to all lower extremity muscle groups bilaterally, no pain crepitus or joint limitation noted with ROM b/l, bunion deformity noted b/l and patient ambulates independent of any assistive aids  Neurological: Protective sensation intact 5/5 intact bilaterally with 10g monofilament b/l. Vibratory sensation intact b/l. Proprioception intact bilaterally. Babinski reflex negative b/l. Clonus negative b/l.  Assessment: 1. Pain due to onychomycosis of toenails of both feet   2. Status post nail surgery   3. Diabetes mellitus without complication ([place])      Plan: -Continue diabetic foot care principles. Literature dispensed on today.  -Toenails 2-5 b/l were debrided in length and girth with sterile nail nippers and dremel without iatrogenic bleeding.   -TAO applied to nailbeds. He was advised to continue applying Vaseline Petroleum Jelly to nailbeds once daily. -Patient to continue soft, supportive shoe gear daily. -Patient to report any pedal injuries to medical professional immediately. -Patient/POA to call should there be question/concern in the interim.  Return in about 3 months (around 04/20/2021).

## 2021-01-28 NOTE — Telephone Encounter (Signed)
Pt past due for an appointment, will likely have blood work at visit.

## 2021-01-28 NOTE — Telephone Encounter (Signed)
Pt is scheduled °

## 2021-02-26 ENCOUNTER — Other Ambulatory Visit: Payer: Self-pay | Admitting: Family Medicine

## 2021-02-26 DIAGNOSIS — E1165 Type 2 diabetes mellitus with hyperglycemia: Secondary | ICD-10-CM

## 2021-02-26 DIAGNOSIS — Z794 Long term (current) use of insulin: Secondary | ICD-10-CM

## 2021-02-26 NOTE — Telephone Encounter (Signed)
Med refilled, but overdue for follow up. Please verify what dose of lantus he has been taking to make sure it matches Rx.

## 2021-03-28 ENCOUNTER — Encounter: Payer: Self-pay | Admitting: Family Medicine

## 2021-03-28 ENCOUNTER — Ambulatory Visit (INDEPENDENT_AMBULATORY_CARE_PROVIDER_SITE_OTHER): Payer: Medicare Other | Admitting: Family Medicine

## 2021-03-28 ENCOUNTER — Other Ambulatory Visit: Payer: Self-pay

## 2021-03-28 VITALS — BP 128/74 | HR 65 | Temp 98.2°F | Resp 17 | Ht 73.0 in | Wt 254.8 lb

## 2021-03-28 DIAGNOSIS — Z1211 Encounter for screening for malignant neoplasm of colon: Secondary | ICD-10-CM

## 2021-03-28 DIAGNOSIS — E785 Hyperlipidemia, unspecified: Secondary | ICD-10-CM | POA: Diagnosis not present

## 2021-03-28 DIAGNOSIS — Z13220 Encounter for screening for lipoid disorders: Secondary | ICD-10-CM | POA: Diagnosis not present

## 2021-03-28 DIAGNOSIS — Z9189 Other specified personal risk factors, not elsewhere classified: Secondary | ICD-10-CM

## 2021-03-28 DIAGNOSIS — E1165 Type 2 diabetes mellitus with hyperglycemia: Secondary | ICD-10-CM

## 2021-03-28 DIAGNOSIS — Z794 Long term (current) use of insulin: Secondary | ICD-10-CM | POA: Diagnosis not present

## 2021-03-28 DIAGNOSIS — I10 Essential (primary) hypertension: Secondary | ICD-10-CM | POA: Diagnosis not present

## 2021-03-28 LAB — COMPREHENSIVE METABOLIC PANEL
ALT: 13 U/L (ref 0–53)
AST: 13 U/L (ref 0–37)
Albumin: 4.1 g/dL (ref 3.5–5.2)
Alkaline Phosphatase: 58 U/L (ref 39–117)
BUN: 12 mg/dL (ref 6–23)
CO2: 27 mEq/L (ref 19–32)
Calcium: 9.3 mg/dL (ref 8.4–10.5)
Chloride: 103 mEq/L (ref 96–112)
Creatinine, Ser: 0.87 mg/dL (ref 0.40–1.50)
GFR: 88.09 mL/min (ref >60.00)
Glucose, Bld: 195 mg/dL — ABNORMAL HIGH (ref 70–99)
Potassium: 4.4 mEq/L (ref 3.5–5.1)
Sodium: 139 mEq/L (ref 135–145)
Total Bilirubin: 0.5 mg/dL (ref 0.2–1.2)
Total Protein: 7.2 g/dL (ref 6.0–8.3)

## 2021-03-28 LAB — LIPID PANEL
Cholesterol: 147 mg/dL (ref 0–200)
HDL: 34.5 mg/dL — ABNORMAL LOW (ref >39.00)
LDL Cholesterol: 90 mg/dL (ref 0–99)
NonHDL: 112.83
Total CHOL/HDL Ratio: 4
Triglycerides: 116 mg/dL (ref 0.0–149.0)
VLDL: 23.2 mg/dL (ref 0.0–40.0)

## 2021-03-28 LAB — MICROALBUMIN / CREATININE URINE RATIO
Creatinine,U: 62.8 mg/dL
Microalb Creat Ratio: 1.1 mg/g (ref 0.0–30.0)
Microalb, Ur: <0.7 mg/dL (ref 0.0–1.9)

## 2021-03-28 LAB — POCT GLYCOSYLATED HEMOGLOBIN (HGB A1C): Hemoglobin A1C: 8.5 % — AB (ref 4.0–5.6)

## 2021-03-28 MED ORDER — METFORMIN HCL 850 MG PO TABS: 850.0000 mg | ORAL_TABLET | Freq: Two times a day (BID) | ORAL | 1 refills | Status: DC

## 2021-03-28 MED ORDER — LANTUS SOLOSTAR 100 UNIT/ML ~~LOC~~ SOPN: 28.0000 [IU] | PEN_INJECTOR | Freq: Every day | SUBCUTANEOUS | 0 refills | Status: DC

## 2021-03-28 MED ORDER — BD PEN NEEDLE NANO U/F 32G X 4 MM MISC: 2 refills | Status: AC

## 2021-03-28 MED ORDER — ATORVASTATIN CALCIUM 10 MG PO TABS: 10.0000 mg | ORAL_TABLET | ORAL | 0 refills | Status: DC

## 2021-03-28 MED ORDER — LISINOPRIL 5 MG PO TABS: 5.0000 mg | ORAL_TABLET | Freq: Every day | ORAL | 1 refills | Status: DC

## 2021-03-28 MED ORDER — GLUCOSE BLOOD VI STRP: ORAL_STRIP | 4 refills | Status: DC

## 2021-03-28 NOTE — Progress Notes (Signed)
Subjective:  Patient ID: Edward Briggs, male    DOB: 1951/05/06  Age: 70 y.o. MRN: [id number]  CC:  Chief Complaint  Patient presents with  . Diabetes    Pt here today for refills f/u labs, pt reports does test at home regularly doing okay. No concerns   . Hypertension    Recheck HTN no physical sxs. Needs refills notes as has been taking sisters old supply when he ran out now needs a refill.   . Hyperlipidemia    Needs labs and refills today   . Referral    Pt is ready to have colon cancer screening would like to discuss options for this    HPI Edward Briggs presents for   Diabetes: Complicated by hyperglycemia, insulin-dependent.  Lost to follow-up, last evaluated in April 2021 for his diabetes. No barriers to care identified.  At last visit in April 2021, was taking metformin 850 mg twice daily, Lantus 26 units/day.  Borderline control but recommended increased to 28 units if higher readings. Current dosing of lantus 26 units. Metformin $RemoveBeforeDE'850mg'[id number]$  BID.  Home readings: Fasting: 120-140 Evening readings 170-180 at times.  Lowest 120. No symtpomatic lows.   He is on ACE inhibitor and statin. Microalbumin: Due Optho, foot exam, pneumovax:  Due for ophthalmology exam.  Foot exam today Diabetic Foot Exam - Simple   Simple Foot Form Visual Inspection No deformities, no ulcerations, no other skin breakdown bilaterally: Yes See comments: Yes Sensation Testing Intact to touch and monofilament testing bilaterally: Yes See comments: Yes Pulse Check See comments: Yes Comments Pt notes nonumbness or tingling, scar on the top of LT great toe from injury many years ago by [employer], this area does have less sensation than other portions of the foot, toenails are thick and discolored but otherwise normal.     Lab Results  Component Value Date   HGBA1C 8.5 (A) 03/28/2021   HGBA1C 8.8 (H) 01/19/2020   HGBA1C 8.6 (H) 10/20/2019   Lab Results  Component Value Date   MICROALBUR  <0.7 03/28/2021   [place] 90 03/28/2021   CREATININE 0.87 03/28/2021   Hypertension: Lisinopril 5 mg daily.  Not taking recently. BP Readings from Last 3 Encounters:  03/28/21 128/74  03/29/20 128/83  03/01/20 123/80   Lab Results  Component Value Date   CREATININE 0.87 03/28/2021    Hyperlipidemia: Lipitor 10 mg daily.  Tolerating current dose, overdue for labs. Missed some doses in past few weeks. Off for few days.  Lab Results  Component Value Date   CHOL 147 03/28/2021   HDL 34.50 (L) 03/28/2021   LDLCALC 90 03/28/2021   TRIG 116.0 03/28/2021   CHOLHDL 4 03/28/2021   Lab Results  Component Value Date   ALT 13 03/28/2021   AST 13 03/28/2021   ALKPHOS 58 03/28/2021   BILITOT 0.5 03/28/2021   HM:  Screening options with colonoscopy versus Cologuard discussed. Discussed timing of repeat testing intervals if normal, as well as potential need for diagnostic Colonoscopy if positive Cologuard. Understanding expressed, and chose Cologuard. Reports normal colonoscopy in past.   Memory difficulty at times in past year - not persistent. No acute changes - plans to follow up to discuss further.   History Patient Active Problem List   Diagnosis Date Noted  . HTN (hypertension) 07/22/2019  . Acute metabolic encephalopathy 32/99/2426  . AKI (acute kidney injury) ([place]) 07/22/2019  . Sepsis ([place]) 07/22/2019  . UTI (urinary tract infection) 07/22/2019  . Diabetes  mellitus without complication ([place]) 24/82/5003  . CHEST PAIN 07/21/2009   Past Medical History:  Diagnosis Date  . Diabetes mellitus without complication ([place])   . GERD (gastroesophageal reflux disease)    no meds   Past Surgical History:  Procedure Laterality Date  . ABSCESS DRAINAGE    . FRACTURE SURGERY    . HARDWARE REMOVAL  03/26/2012   Procedure: HARDWARE REMOVAL;  Surgeon: [name], MD;  Location: [facility];  Service: Orthopedics;  Laterality: Left;  hardware removal deep left ankle  syndesmotic screws only and stress xrays ankle   . ORIF ANKLE FRACTURE  12/03/2011   Procedure: OPEN REDUCTION INTERNAL FIXATION (ORIF) ANKLE FRACTURE;  Surgeon: [name], MD;  Location: [facility];  Service: Orthopedics;  Laterality: Left;  left medial malleolus fracture  . PILONIDAL CYST EXCISION  ~1982  . SKIN GRAFT  ~1970   Lt great toe (graft from Lt thigh)   No Known Allergies Prior to Admission medications   Medication Sig Start Date End Date Taking? Authorizing Provider  Ascorbic Acid (VITAMIN C PO) Take 1 tablet by mouth daily.    Yes [provider]  aspirin 81 MG tablet Take 81 mg by mouth daily.   Yes [provider]  atorvastatin (LIPITOR) 10 MG tablet Take 1 tablet (10 mg total) by mouth once a week. 01/19/20  Yes [name], MD  BD PEN NEEDLE NANO U/F 32G X 4 MM MISC USE DAILY AS DIRECTED 01/15/20  Yes [name], MD  blood glucose meter kit and supplies Dispense based on patient and insurance preference. Use up to four times daily as directed. (FOR ICD-10 E10.9, E11.9). 07/25/19  Yes [name], MD  Blood Glucose Monitoring Suppl (BLOOD GLUCOSE METER) kit Use as instructed 09/10/12  Yes [name], MD  glucose blood test strip Check blood sugar 3 times daily 03/13/20  Yes [name], MD  insulin glargine (LANTUS SOLOSTAR) 100 UNIT/ML Solostar Pen Inject 26 Units into the skin daily. 02/26/21  Yes [name], MD  lisinopril (ZESTRIL) 5 MG tablet Take 1 tablet (5 mg total) by mouth daily. 10/20/19  Yes [name], MD  metFORMIN (GLUCOPHAGE) 850 MG tablet Take 1 tablet (850 mg total) by mouth 2 (two) times daily with a meal. 02/09/20  Yes [name], MD   Social History   Socioeconomic History  . Marital status: Married    Spouse name: Not on file  . Number of children: 2  . Years of education: Not on file  . Highest education level: Not on file  Occupational History  . Not on file  Tobacco  Use  . Smoking status: Former Smoker    Quit date: 10/30/1988    Years since quitting: 32.4  . Smokeless tobacco: Never Used  Vaping Use  . Vaping Use: Never used  Substance and Sexual Activity  . Alcohol use: Yes    Alcohol/week: 1.0 standard drink    Types: 1 Shots of liquor per week    Comment: every two weeks  . Drug use: No  . Sexual activity: Yes    Birth control/protection: None  Other Topics Concern  . Not on file  Social History Narrative  . Not on file   Social Determinants of Health   Financial Resource Strain: Not on file  Food Insecurity: Not on file  Transportation Needs: Not on file  Physical Activity: Not on file  Stress: Not  on file  Social Connections: Not on file  Intimate Partner Violence: Not on file    Review of Systems  Constitutional: Negative for fatigue and unexpected weight change.  Eyes: Negative for visual disturbance.  Respiratory: Negative for cough, chest tightness and shortness of breath.   Cardiovascular: Negative for chest pain, palpitations and leg swelling.  Gastrointestinal: Negative for abdominal pain and blood in stool.  Neurological: Negative for dizziness, light-headedness and headaches.     Objective:   Vitals:   03/28/21 1016  BP: 128/74  Pulse: 65  Resp: 17  Temp: 98.2 F (36.8 C)  TempSrc: Temporal  SpO2: 97%  Weight: 254 lb 12.8 oz (115.6 kg)  Height: $Remove'6\' 1"'DHKgXlg$  (1.854 m)     Physical Exam Vitals reviewed.  Constitutional:      Appearance: He is well-developed.  HENT:     Head: Normocephalic and atraumatic.  Eyes:     Pupils: Pupils are equal, round, and reactive to light.  Neck:     Vascular: No carotid bruit or JVD.  Cardiovascular:     Rate and Rhythm: Normal rate and regular rhythm.     Heart sounds: Normal heart sounds. No murmur heard.   Pulmonary:     Effort: Pulmonary effort is normal.     Breath sounds: Normal breath sounds. No rales.  Chest:    Skin:    General: Skin is warm and dry.   Neurological:     Mental Status: He is alert and oriented to person, place, and time.    Results for orders placed or performed in visit on 03/28/21  Comprehensive metabolic panel  Result Value Ref Range   Sodium 139 135 - 145 mEq/L   Potassium 4.4 3.5 - 5.1 mEq/L   Chloride 103 96 - 112 mEq/L   CO2 27 19 - 32 mEq/L   Glucose, Bld 195 (H) 70 - 99 mg/dL   BUN 12 6 - 23 mg/dL   Creatinine, Ser 0.87 0.40 - 1.50 mg/dL   Total Bilirubin 0.5 0.2 - 1.2 mg/dL   Alkaline Phosphatase 58 39 - 117 U/L   AST 13 0 - 37 U/L   ALT 13 0 - 53 U/L   Total Protein 7.2 6.0 - 8.3 g/dL   Albumin 4.1 3.5 - 5.2 g/dL   GFR 88.09 >60.00 mL/min   Calcium 9.3 8.4 - 10.5 mg/dL  Lipid panel  Result Value Ref Range   Cholesterol 147 0 - 200 mg/dL   Triglycerides 116.0 0.0 - 149.0 mg/dL   HDL 34.50 (L) >39.00 mg/dL   VLDL 23.2 0.0 - 40.0 mg/dL   LDL Cholesterol 90 0 - 99 mg/dL   Total CHOL/HDL Ratio 4    NonHDL 112.83   Microalbumin / creatinine urine ratio  Result Value Ref Range   Microalb, Ur <0.7 0.0 - 1.9 mg/dL   Creatinine,U 62.8 mg/dL   Microalb Creat Ratio 1.1 0.0 - 30.0 mg/g  POCT glycosylated hemoglobin (Hb A1C)  Result Value Ref Range   Hemoglobin A1C 8.5 (A) 4.0 - 5.6 %   HbA1c POC (<> result, manual entry)     HbA1c, POC (prediabetic range)     HbA1c, POC (controlled diabetic range)      Assessment & Plan:  Edward Briggs is a 70 y.o. male . Type 2 diabetes mellitus with hyperglycemia, with long-term current use of insulin ([facility]) - Plan: atorvastatin (LIPITOR) 10 MG tablet, metFORMIN (GLUCOPHAGE) 850 MG tablet, glucose blood test strip, Comprehensive metabolic panel, Microalbumin /  creatinine urine ratio, insulin glargine (LANTUS SOLOSTAR) 100 UNIT/ML Solostar Pen Type 2 diabetes mellitus with hyperglycemia, without long-term current use of insulin ([facility]) - Plan: Insulin Pen Needle (BD PEN NEEDLE NANO U/F) 32G X 4 MM MISC, POCT glycosylated hemoglobin (Hb A1C)  - increase lantus to 28  units. Same dose metformin for now.  Check labs.  -Variable diabetic control could impact memory, but plans to follow-up next few weeks to discuss memory symptoms further.  Essential hypertension - Plan: Comprehensive metabolic panel, lisinopril (ZESTRIL) 5 MG tablet  -Continue lisinopril, check labs  Screening for colon cancer - Plan: Cologuard  Screening for hyperlipidemia - Plan: Lipid panel At increased risk for cardiovascular disease - Plan: atorvastatin (LIPITOR) 10 MG tablet, Lipid panel  -Continue Lipitor 10 mg daily, check labs.  Meds ordered this encounter  Medications  . atorvastatin (LIPITOR) 10 MG tablet    Sig: Take 1 tablet (10 mg total) by mouth once a week.    Dispense:  30 tablet    Refill:  0  . metFORMIN (GLUCOPHAGE) 850 MG tablet    Sig: Take 1 tablet (850 mg total) by mouth 2 (two) times daily with a meal.    Dispense:  180 tablet    Refill:  1  . glucose blood test strip    Sig: Check blood sugar 3 times daily    Dispense:  100 each    Refill:  4    Dx: E11.65, Z79.4  . Insulin Pen Needle (BD PEN NEEDLE NANO U/F) 32G X 4 MM MISC    Sig: USE DAILY AS DIRECTED    Dispense:  100 each    Refill:  2    DX Code Needed  .  . lisinopril (ZESTRIL) 5 MG tablet    Sig: Take 1 tablet (5 mg total) by mouth daily.    Dispense:  90 tablet    Refill:  1  . insulin glargine (LANTUS SOLOSTAR) 100 UNIT/ML Solostar Pen    Sig: Inject 28 Units into the skin daily.    Dispense:  15 mL    Refill:  0   Patient Instructions   Please follow up in few weeks to discuss memory. If any concerns on labs I will let you know. Increase lantus to 28 units for now. Keep a record of your blood sugars outside of the office and bring them to the next office visit (fasting and 2 hours after meals).    Type 2 Diabetes Mellitus, Self-Care, Adult Caring for yourself after you have been diagnosed with type 2 diabetes (type 2 diabetes mellitus) means keeping your blood sugar (glucose) under  control with a balance of:  Nutrition.  Exercise.  Lifestyle changes.  Medicines or insulin, if needed.  Support from your team of health care providers and others. The following information explains what you need to know to manage your diabetes at home. What are the risks? Having diabetes can put you at risk for other long-term (chronic) conditions, such as heart disease and kidney disease. Your health care provider may prescribe medicines to help prevent complications from diabetes. How to monitor blood glucose  Check your blood glucose every day, as often as told by your health care provider.  Have your A1C (hemoglobin A1C) level checked two or more times a year, or as often as told by your health care provider.  Your health care provider will set personalized treatment goals for you. Generally, the goal of treatment is to maintain the following  blood glucose levels: ? Before meals: 80-130 mg/dL (4.4-7.2 mmol/L). ? After meals: below 180 mg/dL (10 mmol/L). ? A1C level: less than 7%.   How to manage hyperglycemia and hypoglycemia Hyperglycemia symptoms Hyperglycemia, also called high blood glucose, occurs when blood glucose is too high. Make sure you know the early signs of hyperglycemia, such as:  Increased thirst.  Hunger.  Feeling very tired.  Needing to urinate more often than usual.  Blurry vision. Hypoglycemia symptoms Hypoglycemia, also called low blood glucose, occurs with a blood glucose level at or below 70 mg/dL (3.9 mmol/L). Diabetes medicines lower your blood glucose and can cause hypoglycemia. The risk for hypoglycemia increases during or after exercise, during sleep, during illness, and when skipping meals or not eating for a long time (fasting). It is important to know the symptoms of hypoglycemia and treat it right away. Always have a 15-gram rapid-acting carbohydrate snack with you to treat low blood glucose. Family members and close friends should also know  the symptoms and understand how to treat hypoglycemia, in case you are not able to treat yourself. Symptoms may include:  Hunger.  Anxiety.  Sweating and feeling clammy.  Dizziness or feeling light-headed.  Sleepiness.  Increased heart rate.  Irritability.  Tingling or numbness around the mouth, lips, or tongue.  Restless sleep. Severe hypoglycemia is when your blood glucose level is at or below 54 mg/dL (3 mmol/L). Severe hypoglycemia is an emergency. Do not wait to see if the symptoms will go away. Get medical help right away. Call your local emergency services (911 in the [place] Do not drive yourself to the hospital. If you have severe hypoglycemia and you cannot eat or drink, you may need glucagon. A family member or close friend should learn how to check your blood glucose and how to give you glucagon. Ask your health care provider if you need to have an emergency glucagon kit available. Follow these instructions at home: Medicines  Take diabetes medicines as told by your health care provider. If your health care provider prescribed insulin or diabetes medicines, take them every day.  Do not run out of insulin or other diabetes medicines. Plan ahead so you always have these available.  If you use insulin, adjust your dosage based on your physical activity and what foods you eat. Your health care provider will tell you how to adjust your dosage.  Take over-the-counter and prescription medicines only as told by your health care provider. Eating and drinking What you eat and drink affects your blood glucose and your insulin dosage. Making good choices helps to control your diabetes and prevent other health problems. A healthy meal plan includes eating lean proteins, complex carbohydrates, fresh fruits and vegetables, low-fat dairy products, and healthy fats. Make an appointment to see a registered dietitian to help you create an eating plan that is right for you. Make sure that  you:  Follow instructions from your health care provider about eating or drinking restrictions.  Drink enough fluid to keep your urine pale yellow.  Keep a record of the carbohydrates that you eat. Do this by reading food labels and learning the standard serving sizes of foods.  Follow your sick-day plan whenever you cannot eat or drink as usual. Make this plan in advance with your health care provider.   Activity  Stay active. Exercise regularly, as told by your health care provider. This may include: ? Stretching and doing strength exercises, such as yoga or weight lifting, 2 or more  times a week. ? Doing 150 minutes or more of moderate-intensity or vigorous-intensity exercise each week. This could be brisk walking, biking, or water aerobics.  Spread out your activity over 3 or more days of the week.  Do not go more than 2 days in a row without doing some kind of physical activity.  When you start a new exercise or activity, work with your health care provider to adjust your insulin, medicines, or food intake as needed. Lifestyle  Do not use any products that contain nicotine or tobacco, such as cigarettes, e-cigarettes, and chewing tobacco. If you need help quitting, ask your health care provider.  If your health care provider says that alcohol is safe for you, limit how much you use to no more than 1 drink a day for women who are not pregnant and 2 drinks a day for men. In the [place] one drink equals one 12 oz bottle of beer (355 mL), one 5 oz glass of wine (148 mL), or one 1 oz glass of hard liquor (44 mL).  Learn to manage stress. If you need help with this, ask your health care provider. Take care of your body  Keep your immunizations up to date. In addition to getting vaccinations as told by your health care provider, it is recommended that you get vaccinated against the following illnesses: ? The flu (influenza). Get a flu shot every year. ? Pneumonia. ? Hepatitis  B.  Schedule an eye exam soon after your diagnosis, and then one time every year after that.  Check your skin and feet every day for cuts, bruises, redness, blisters, or sores. Schedule a foot exam with your health care provider once every year.  Brush your teeth and gums two times a day, and floss one or more times a day. Visit your dentist one or more times every 6 months.  Maintain a healthy weight.   General instructions  Share your diabetes management plan with people in your workplace, school, and household.  Carry a medical alert card or wear medical alert jewelry.  Keep all follow-up visits as told by your health care provider. This is important. Questions to ask your health care provider  Should I meet with a certified diabetes care and education specialist?  Where can I find a support group for people with diabetes? Where to find more information  American Diabetes Association (ADA): www.diabetes.org  American Association of Diabetes Care and Education Specialists (ADCES): www.diabeteseducator.org  International Diabetes Federation (IDF): MemberVerification.ca Summary  Caring for yourself after you have been diagnosed with type 2 diabetes (type 2 diabetes mellitus) means keeping your blood sugar (glucose) under control with a balance of nutrition, exercise, lifestyle changes, and medicine.  Check your blood glucose every day, as often as told by your health care provider.  Having diabetes can put you at risk for other long-term (chronic) conditions, such as heart disease and kidney disease. Your health care provider may prescribe medicines to help prevent complications from diabetes.  Share your diabetes management plan with people in your workplace, school, and household.  Keep all follow-up visits as told by your health care provider. This is important. This information is not intended to replace advice given to you by your health care provider. Make sure you discuss any  questions you have with your health care provider. Document Revised: 11/28/2019 Document Reviewed: 11/29/2019 Elsevier Patient Education  2021 [place].      Signed, [name], MD [facility]

## 2021-03-28 NOTE — Patient Instructions (Addendum)
Please follow up in few weeks to discuss memory. If any concerns on labs I will let you know. Increase lantus to 28 units for now. Keep a record of your blood sugars outside of the office and bring them to the next office visit (fasting and 2 hours after meals).    Type 2 Diabetes Mellitus, Self-Care, Adult Caring for yourself after you have been diagnosed with type 2 diabetes (type 2 diabetes mellitus) means keeping your blood sugar (glucose) under control with a balance of:  Nutrition.  Exercise.  Lifestyle changes.  Medicines or insulin, if needed.  Support from your team of health care providers and others. The following information explains what you need to know to manage your diabetes at home. What are the risks? Having diabetes can put you at risk for other long-term (chronic) conditions, such as heart disease and kidney disease. Your health care provider may prescribe medicines to help prevent complications from diabetes. How to monitor blood glucose  Check your blood glucose every day, as often as told by your health care provider.  Have your A1C (hemoglobin A1C) level checked two or more times a year, or as often as told by your health care provider.  Your health care provider will set personalized treatment goals for you. Generally, the goal of treatment is to maintain the following blood glucose levels: ? Before meals: 80-130 mg/dL (4.4-7.2 mmol/L). ? After meals: below 180 mg/dL (10 mmol/L). ? A1C level: less than 7%.   How to manage hyperglycemia and hypoglycemia Hyperglycemia symptoms Hyperglycemia, also called high blood glucose, occurs when blood glucose is too high. Make sure you know the early signs of hyperglycemia, such as:  Increased thirst.  Hunger.  Feeling very tired.  Needing to urinate more often than usual.  Blurry vision. Hypoglycemia symptoms Hypoglycemia, also called low blood glucose, occurs with a blood glucose level at or below 70 mg/dL  (3.9 mmol/L). Diabetes medicines lower your blood glucose and can cause hypoglycemia. The risk for hypoglycemia increases during or after exercise, during sleep, during illness, and when skipping meals or not eating for a long time (fasting). It is important to know the symptoms of hypoglycemia and treat it right away. Always have a 15-gram rapid-acting carbohydrate snack with you to treat low blood glucose. Family members and close friends should also know the symptoms and understand how to treat hypoglycemia, in case you are not able to treat yourself. Symptoms may include:  Hunger.  Anxiety.  Sweating and feeling clammy.  Dizziness or feeling light-headed.  Sleepiness.  Increased heart rate.  Irritability.  Tingling or numbness around the mouth, lips, or tongue.  Restless sleep. Severe hypoglycemia is when your blood glucose level is at or below 54 mg/dL (3 mmol/L). Severe hypoglycemia is an emergency. Do not wait to see if the symptoms will go away. Get medical help right away. Call your local emergency services (911 in the [place] Do not drive yourself to the hospital. If you have severe hypoglycemia and you cannot eat or drink, you may need glucagon. A family member or close friend should learn how to check your blood glucose and how to give you glucagon. Ask your health care provider if you need to have an emergency glucagon kit available. Follow these instructions at home: Medicines  Take diabetes medicines as told by your health care provider. If your health care provider prescribed insulin or diabetes medicines, take them every day.  Do not run out of insulin or other diabetes  medicines. Plan ahead so you always have these available.  If you use insulin, adjust your dosage based on your physical activity and what foods you eat. Your health care provider will tell you how to adjust your dosage.  Take over-the-counter and prescription medicines only as told by your health care  provider. Eating and drinking What you eat and drink affects your blood glucose and your insulin dosage. Making good choices helps to control your diabetes and prevent other health problems. A healthy meal plan includes eating lean proteins, complex carbohydrates, fresh fruits and vegetables, low-fat dairy products, and healthy fats. Make an appointment to see a registered dietitian to help you create an eating plan that is right for you. Make sure that you:  Follow instructions from your health care provider about eating or drinking restrictions.  Drink enough fluid to keep your urine pale yellow.  Keep a record of the carbohydrates that you eat. Do this by reading food labels and learning the standard serving sizes of foods.  Follow your sick-day plan whenever you cannot eat or drink as usual. Make this plan in advance with your health care provider.   Activity  Stay active. Exercise regularly, as told by your health care provider. This may include: ? Stretching and doing strength exercises, such as yoga or weight lifting, 2 or more times a week. ? Doing 150 minutes or more of moderate-intensity or vigorous-intensity exercise each week. This could be brisk walking, biking, or water aerobics.  Spread out your activity over 3 or more days of the week.  Do not go more than 2 days in a row without doing some kind of physical activity.  When you start a new exercise or activity, work with your health care provider to adjust your insulin, medicines, or food intake as needed. Lifestyle  Do not use any products that contain nicotine or tobacco, such as cigarettes, e-cigarettes, and chewing tobacco. If you need help quitting, ask your health care provider.  If your health care provider says that alcohol is safe for you, limit how much you use to no more than 1 drink a day for women who are not pregnant and 2 drinks a day for men. In the [place] one drink equals one 12 oz bottle of beer (355 mL),  one 5 oz glass of wine (148 mL), or one 1 oz glass of hard liquor (44 mL).  Learn to manage stress. If you need help with this, ask your health care provider. Take care of your body  Keep your immunizations up to date. In addition to getting vaccinations as told by your health care provider, it is recommended that you get vaccinated against the following illnesses: ? The flu (influenza). Get a flu shot every year. ? Pneumonia. ? Hepatitis B.  Schedule an eye exam soon after your diagnosis, and then one time every year after that.  Check your skin and feet every day for cuts, bruises, redness, blisters, or sores. Schedule a foot exam with your health care provider once every year.  Brush your teeth and gums two times a day, and floss one or more times a day. Visit your dentist one or more times every 6 months.  Maintain a healthy weight.   General instructions  Share your diabetes management plan with people in your workplace, school, and household.  Carry a medical alert card or wear medical alert jewelry.  Keep all follow-up visits as told by your health care provider. This is important. Questions  to ask your health care provider  Should I meet with a certified diabetes care and education specialist?  Where can I find a support group for people with diabetes? Where to find more information  American Diabetes Association (ADA): www.diabetes.org  American Association of Diabetes Care and Education Specialists (ADCES): www.diabeteseducator.org  International Diabetes Federation (IDF): MemberVerification.ca Summary  Caring for yourself after you have been diagnosed with type 2 diabetes (type 2 diabetes mellitus) means keeping your blood sugar (glucose) under control with a balance of nutrition, exercise, lifestyle changes, and medicine.  Check your blood glucose every day, as often as told by your health care provider.  Having diabetes can put you at risk for other long-term (chronic)  conditions, such as heart disease and kidney disease. Your health care provider may prescribe medicines to help prevent complications from diabetes.  Share your diabetes management plan with people in your workplace, school, and household.  Keep all follow-up visits as told by your health care provider. This is important. This information is not intended to replace advice given to you by your health care provider. Make sure you discuss any questions you have with your health care provider. Document Revised: 11/28/2019 Document Reviewed: 11/29/2019 Elsevier Patient Education  2021 [employer].

## 2021-04-04 ENCOUNTER — Telehealth: Payer: Self-pay

## 2021-04-04 NOTE — Telephone Encounter (Signed)
Edward Briggs is calling in from [facility] with patient on the line they are wanting to go over lab results from his last visit. Requesting a call back.

## 2021-04-12 ENCOUNTER — Ambulatory Visit: Payer: Medicare Other | Admitting: Podiatry

## 2021-04-15 ENCOUNTER — Other Ambulatory Visit: Payer: Self-pay

## 2021-04-15 ENCOUNTER — Ambulatory Visit: Payer: Medicare Other | Admitting: Podiatry

## 2021-04-15 DIAGNOSIS — M79675 Pain in left toe(s): Secondary | ICD-10-CM | POA: Diagnosis not present

## 2021-04-15 DIAGNOSIS — B351 Tinea unguium: Secondary | ICD-10-CM | POA: Diagnosis not present

## 2021-04-15 DIAGNOSIS — M79674 Pain in right toe(s): Secondary | ICD-10-CM | POA: Diagnosis not present

## 2021-04-17 LAB — HM DIABETES EYE EXAM

## 2021-04-19 ENCOUNTER — Encounter: Payer: Self-pay | Admitting: Podiatry

## 2021-04-19 NOTE — Progress Notes (Signed)
Subjective: Edward Briggs is a pleasant 70 y.o. male patient seen today for preventative diabetic foot care with painful thick toenails that are difficult to trim. Pain interferes with ambulation. Aggravating factors include wearing enclosed shoe gear. Pain is relieved with periodic professional debridement.  He states his great toes continue to do well now since his temporary nail avulsions.  Patient states his blood glucose was 142 mg/dl this morning.  PCP is [name], MD. Last visit was: 03/28/2021.  No Known Allergies  Objective: Physical Exam  General: Edward Briggs is a pleasant 70 y.o. African American male, in NAD. AAO x 3.   Vascular:  Capillary refill time to digits immediate b/l. Palpable DP pulse(s) b/l lower extremities Faintly palpable PT pulse(s) b/l lower extremities. Pedal hair sparse. Lower extremity skin temperature gradient within normal limits.  Dermatological:  Pedal skin with normal turgor, texture and tone bilaterally. No open wounds bilaterally. No interdigital macerations bilaterally. Toenails 1-5 b/l elongated, discolored, dystrophic, thickened, crumbly with subungual debris and tenderness to dorsal palpation.  Musculoskeletal:  Normal muscle strength 5/5 to all lower extremity muscle groups bilaterally. No pain crepitus or joint limitation noted with ROM b/l. Hallux valgus with bunion deformity noted b/l lower extremities.  Neurological:  Protective sensation intact 5/5 intact bilaterally with 10g monofilament b/l. Vibratory sensation intact b/l.  Assessment and Plan:  1. Pain due to onychomycosis of toenails of both feet     -Examined patient. -Continue diabetic foot care principles. -Patient to continue soft, supportive shoe gear daily. -Toenails 1-5 b/l were debrided in length and girth with sterile nail nippers and dremel without iatrogenic bleeding.  -Patient to report any pedal injuries to medical professional immediately. -Patient/POA  to call should there be question/concern in the interim.  Return in about 3 months (around 07/16/2021).  Marzetta Board, DPM

## 2021-04-23 DIAGNOSIS — Z9189 Other specified personal risk factors, not elsewhere classified: Secondary | ICD-10-CM

## 2021-04-23 NOTE — Progress Notes (Signed)
[facility])                                            [place] Team                                        Statin Quality Measure Assessment    04/23/2021  [name] Mar 18, 1951 [id number]  Per review of chart and payor information, this patient has been flagged for non-adherence to the following CMS Quality Measure:   [x]  Statin Use in Persons with Diabetes  []  Statin Use in Persons with Cardiovascular Disease  The 10-year ASCVD risk score [name], et al., 2013) is: 32.7%   Values used to calculate the score:     Age: 70 years     Sex: Male     Is Non-Hispanic African American: Yes     Diabetic: Yes     Tobacco smoker: No     Systolic Blood Pressure: 500 mmHg     Is BP treated: Yes     HDL Cholesterol: 34.5 mg/dL     Total Cholesterol: 147 mg/dL  03/28/2021  Currently prescribed statin:  [x]  Yes []  No     Comments: Atorvastatin 10 mg once weekly. Given dosing regimen of atorvastatin, it is unclear if the patient has had a h/o statin intolerance. Per [place], when called on 04/15/2020, patient last picked up atorvastatin on 01/19/2020.Per [facility], atorvastatin is not on file. I called the patient and LVM. At this time, it is unclear if the patient is filling atorvastatin at another pharmacy, non-compliant, or has a stockpile. If appropriate, may consider discussing statin compliance at the next office visit.   Please consider ONE of the following recommendations highlighted in blue:   Initiate high intensity statin Atorvastatin 40mg  once daily, #90, 3 refills   Rosuvastatin 20mg  once daily, #90, 3 refills    Initiate moderate intensity          statin with reduced frequency if prior          statin intolerance 1x weekly, #13, 3 refills   2x weekly, #26, 3 refills   3x weekly, #39, 3 refills    Code for past statin intolerance or other exclusions (required annually)  Drug Induced  Myopathy G72.0   Myositis, unspecified M60.9   Rhabdomyolysis M62.82   Prediabetes R73.03   Adverse effect of antihyperlipidemic and antiarteriosclerotic drugs, initial encounter B70.4U8Q    Thank you for your time,  Kristeen Miss, [place] Cell: [phone]

## 2021-04-24 ENCOUNTER — Other Ambulatory Visit: Payer: Self-pay

## 2021-04-24 ENCOUNTER — Other Ambulatory Visit: Payer: Self-pay | Admitting: Family Medicine

## 2021-04-24 ENCOUNTER — Ambulatory Visit (INDEPENDENT_AMBULATORY_CARE_PROVIDER_SITE_OTHER): Payer: Medicare Other | Admitting: Family Medicine

## 2021-04-24 VITALS — BP 132/76 | HR 99 | Temp 98.4°F | Resp 17 | Ht 73.0 in | Wt 256.8 lb

## 2021-04-24 DIAGNOSIS — R413 Other amnesia: Secondary | ICD-10-CM

## 2021-04-24 DIAGNOSIS — E1165 Type 2 diabetes mellitus with hyperglycemia: Secondary | ICD-10-CM | POA: Diagnosis not present

## 2021-04-24 DIAGNOSIS — Z794 Long term (current) use of insulin: Secondary | ICD-10-CM

## 2021-04-24 NOTE — Progress Notes (Signed)
Subjective:  Patient ID: Edward Briggs, male    DOB: [birth year]  Age: 70 y.o. MRN: [id number]  CC:  Chief Complaint  Patient presents with   Memory Loss    Pt mentioned previous memory loss, here today to revisit that concern and discuss lab work.     HPI [employer] presents for   Memory changes: Briefly noted our last visit.  Intermittent difficulty with memory over the past year but not persistent.  Denied acute changes at that time.  He does have a history of metabolic encephalopathy, hypertension, diabetes with complication of hyperglycemia.  Last A1c 8.5.  Lantus was increased from 26 to 28 units at that visit.  Continued  same dose of metformin. Home readings: 120-130 fasting.  highest 170-190 after eating. No 200's.  No symptomatic lows.   Here with spouse. Reports memory changes past several months. Repeating questions. Forgetting which day it is. Forgets conversations with spouse. 1 beer per week. No IDU. No OTC supplements. Same past few months.  Had house call eval 6/2 - trouble with clock drawing  No palpitations.  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   6CIT Screen 08/10/2019 12/07/2018  What Year? 0 points 0 points  What month? 0 points -  What time? 0 points 0 points  Count back from 20 0 points 0 points  Months in reverse 0 points 0 points  Repeat phrase 8 points 0 points  Total Score 8 -   MOCA score: 10/30. See scanned copy.   History Patient Active Problem List   Diagnosis Date Noted   HTN (hypertension) 52/84/1324   Acute metabolic encephalopathy 40/08/2724   AKI (acute kidney injury) ([place]) 07/22/2019   Sepsis ([place]) 07/22/2019   UTI (urinary tract infection) 07/22/2019   Diabetes mellitus without complication ([place]) 36/64/4034   CHEST PAIN 07/21/2009   Past Medical History:  Diagnosis Date   Diabetes mellitus without complication ([place])    GERD (gastroesophageal reflux disease)    no meds   Past Surgical History:   Procedure Laterality Date   ABSCESS DRAINAGE     FRACTURE SURGERY     HARDWARE REMOVAL  03/26/2012   Procedure: HARDWARE REMOVAL;  Surgeon: [name], MD;  Location: [place];  Service: Orthopedics;  Laterality: Left;  hardware removal deep left ankle syndesmotic screws only and stress xrays ankle    ORIF ANKLE FRACTURE  12/03/2011   Procedure: OPEN REDUCTION INTERNAL FIXATION (ORIF) ANKLE FRACTURE;  Surgeon: [name], MD;  Location: [place];  Service: Orthopedics;  Laterality: Left;  left medial malleolus fracture   PILONIDAL CYST EXCISION  ~1982   SKIN GRAFT  ~1970   Lt great toe (graft from Lt thigh)   No Known Allergies Prior to Admission medications   Medication Sig Start Date End Date Taking? Authorizing Provider  Ascorbic Acid (VITAMIN C PO) Take 1 tablet by mouth daily.    Yes [provider]  aspirin 81 MG tablet Take 81 mg by mouth daily.   Yes [provider]  atorvastatin (LIPITOR) 10 MG tablet Take 1 tablet (10 mg total) by mouth once a week. 03/28/21  Yes [name], MD  blood glucose meter kit and supplies Dispense based on patient and insurance preference. Use up to four times daily as directed. (FOR ICD-10 E10.9, E11.9). 07/25/19  Yes [name], MD  Blood Glucose Monitoring Suppl (BLOOD GLUCOSE METER) kit Use as instructed 09/10/12  Yes [name], MD  glucose blood test strip Check blood sugar 3 times daily 03/28/21  Yes [name], MD  Insulin Pen Needle (BD PEN NEEDLE NANO U/F) 32G X 4 MM MISC USE DAILY AS DIRECTED 03/28/21  Yes [name], MD  LANTUS SOLOSTAR 100 UNIT/ML Solostar Pen INJECT 26 UNITS INTO THE SKIN DAILY. 04/24/21  Yes [name], MD  lisinopril (ZESTRIL) 5 MG tablet Take 1 tablet (5 mg total) by mouth daily. 03/28/21  Yes [name], MD  metFORMIN (GLUCOPHAGE) 850 MG tablet Take 1 tablet (850 mg total) by mouth 2 (two) times daily with a meal. 03/28/21   Yes [name], MD   Social History   Socioeconomic History   Marital status: Married    Spouse name: Not on file   Number of children: 2   Years of education: Not on file   Highest education level: Not on file  Occupational History   Not on file  Tobacco Use   Smoking status: Former    Pack years: 0.00    Types: Cigarettes    Quit date: 10/30/1988    Years since quitting: 32.5   Smokeless tobacco: Never  Vaping Use   Vaping Use: Never used  Substance and Sexual Activity   Alcohol use: Yes    Alcohol/week: 1.0 standard drink    Types: 1 Shots of liquor per week    Comment: every two weeks   Drug use: No   Sexual activity: Yes    Birth control/protection: None  Other Topics Concern   Not on file  Social History Narrative   Not on file   Social Determinants of Health   Financial Resource Strain: Not on file  Food Insecurity: Not on file  Transportation Needs: Not on file  Physical Activity: Not on file  Stress: Not on file  Social Connections: Not on file  Intimate Partner Violence: Not on file    Review of Systems  Constitutional:  Negative for fatigue and unexpected weight change.  Eyes:  Negative for visual disturbance (no diplopia or vision changes.).  Respiratory:  Negative for cough, chest tightness and shortness of breath.   Cardiovascular:  Negative for chest pain, palpitations and leg swelling.  Gastrointestinal:  Negative for abdominal pain and blood in stool.  Neurological:  Negative for dizziness, weakness, light-headedness and headaches (no new HA.).    Objective:   Vitals:   04/24/21 1352  BP: 132/76  Pulse: 99  Resp: 17  Temp: 98.4 F (36.9 C)  TempSrc: Temporal  SpO2: 99%  Weight: 256 lb 12.8 oz (116.5 kg)  Height: 6' 1"  (1.854 m)     Physical Exam Vitals reviewed.  Constitutional:      Appearance: He is well-developed.  HENT:     Head: Normocephalic and atraumatic.  Neck:     Vascular: No carotid bruit or JVD.   Cardiovascular:     Rate and Rhythm: Normal rate and regular rhythm.     Heart sounds: Normal heart sounds. No murmur heard. Pulmonary:     Effort: Pulmonary effort is normal.     Breath sounds: Normal breath sounds. No rales.  Musculoskeletal:     Right lower leg: No edema.     Left lower leg: No edema.  Skin:    General: Skin is warm and dry.  Neurological:     Mental Status: He is alert and oriented to person, place, and time.  Psychiatric:  Mood and Affect: Mood normal.     Assessment & Plan:  Edward Briggs is a 70 y.o. male . Type 2 diabetes mellitus with hyperglycemia, with long-term current use of insulin (HCC)  -Uncontrolled by last A1c, still some elevated readings.  We will try a slight increase of Lantus to 30 units/day with hypoglycemia precautions.  Recheck 2 months.  Memory changes - Plan: B12, TSH, Ambulatory referral to Neurology  -Difficulty with memory as above, recent CMP reassuring with the exception of glucose as above.  We will check TSH, B12 for reversible causes, refer to neurology.  RTC/ER precautions if acute worsening.  No orders of the defined types were placed in this encounter.  Patient Instructions  I will refer you to memory specialist. Ok to increase lantus to 30 units. If any low readings return to previous dose.   Return to the clinic or go to the nearest emergency room if any of your symptoms worsen or new symptoms occur.    Signed,   [name], MD [place] 04/24/21 3:20 PM

## 2021-04-24 NOTE — Patient Instructions (Signed)
I will refer you to memory specialist. Ok to increase lantus to 30 units. If any low readings return to previous dose.   Return to the clinic or go to the nearest emergency room if any of your symptoms worsen or new symptoms occur.

## 2021-04-25 LAB — TSH: TSH: 1.64 u[IU]/mL (ref 0.35–4.50)

## 2021-04-25 LAB — VITAMIN B12: Vitamin B-12: 324 pg/mL (ref 211–911)

## 2021-05-29 ENCOUNTER — Encounter: Payer: Self-pay | Admitting: Neurology

## 2021-05-29 ENCOUNTER — Ambulatory Visit: Payer: Medicare Other | Admitting: Neurology

## 2021-05-29 VITALS — BP 128/79 | HR 76 | Ht 72.0 in | Wt 258.0 lb

## 2021-05-29 DIAGNOSIS — G309 Alzheimer's disease, unspecified: Secondary | ICD-10-CM

## 2021-05-29 DIAGNOSIS — E1165 Type 2 diabetes mellitus with hyperglycemia: Secondary | ICD-10-CM | POA: Diagnosis not present

## 2021-05-29 DIAGNOSIS — R4189 Other symptoms and signs involving cognitive functions and awareness: Secondary | ICD-10-CM

## 2021-05-29 DIAGNOSIS — Z794 Long term (current) use of insulin: Secondary | ICD-10-CM

## 2021-05-29 MED ORDER — DONEPEZIL HCL 5 MG PO TABS: 5.0000 mg | ORAL_TABLET | Freq: Every day | ORAL | 4 refills | Status: DC

## 2021-05-29 MED ORDER — GLUCOSE BLOOD VI STRP: ORAL_STRIP | 4 refills | Status: DC

## 2021-05-29 NOTE — Patient Instructions (Addendum)
You have been prescribed Aricept for your cognitive impairment. Please take 5 mg nightly EEG to rule out epileptiform activity as etiology of memory loss and confusion MRI of the brain to look for structural, demyelinating, inflammatory or other causes Neuropsychiatric testing   Follow up in 3 months    --------------------------------------------------------------------------------------------  There are well-accepted and sensible ways to reduce risk for Alzheimers disease and other degenerative brain disorders .  Exercise Daily Walk A daily 20 minute walk should be part of your routine. Disease related apathy can be a significant roadblock to exercise and the only way to overcome this is to make it a daily routine and perhaps have a reward at the end (something your loved one loves to eat or drink perhaps) or a personal trainer coming to the home can also be very useful. Most importantly, the patient is much more likely to exercise if the caregiver / spouse does it with him/her. In general a structured, repetitive schedule is best.  General Health: Any diseases which effect your body will effect your brain such as a pneumonia, urinary infection, blood clot, heart attack or stroke. Keep contact with your primary care doctor for regular follow ups.  Sleep. A good nights sleep is healthy for the brain. Seven hours is recommended. If you have insomnia or poor sleep habits we can give you some instructions. If you have sleep apnea wear your mask.  Diet: Eating a heart healthy diet is also a good idea; fish and poultry instead of red meat, nuts (mostly non-peanuts), vegetables, fruits, olive oil or canola oil (instead of butter), minimal salt (use other spices to flavor foods), whole grain rice, bread, cereal and pasta and wine in moderation.Research is now showing that the MIND diet, which is a combination of The Mediterranean diet and the DASH diet, is beneficial for cognitive processing and  longevity. Information about this diet can be found in The MIND Diet, a book by Doyne Keel, MS, RDN, and online at [employer]bookDistributors.si  Finances, Power of Attorney and Advance Directives: You should consider putting legal safeguards in place with regard to financial and medical decision making. While the spouse always has power of attorney for medical and financial issues in the absence of any form, you should consider what you want in case the spouse / caregiver is no longer around or capable of making decisions.

## 2021-05-29 NOTE — Progress Notes (Signed)
[facility]  PATIENT: Edward Briggs DOB: 03-14-1951  REFERRING CLINICIAN: [name], MD HISTORY FROM: From patient and wife [name] Schaff  REASON FOR VISIT: Memory problems    HISTORICAL  CHIEF COMPLAINT:  Chief Complaint  Patient presents with   New Patient (Initial Visit)    Rm 22, wife NP internal referral for memory changes  Wife states memory has declined in the last few months,  No changes in mood, behavior, appetite or sleep States he does word searches to help with memory . C/o fatigue       HISTORY OF PRESENT ILLNESS:  This is a 70 year old gentleman with past medical history of diabetes, hypertension, hyperlipidemia who is presenting for memory problem.  Patient states that "He does not feel too bad about his memory".  He reports that he feels a little more forgetful lately.  He reported he is still independent, able to carry activities of daily living but still a little forgetful.  Wife reports that patient does not usually remember what they have been talking about.  He has asked her to repeat the same thing over and over. Wife reported memory problem has been going on for the past 6 months and it is getting worse.  Most of the issues are related to missing objects. He has missed his wallet, misplaced his phone and does not remember previous conversations.  He is able to cook, clean, bath and dress himself.  He has not had any recent accident or get lost in familiar places.  He continues to drive but for only for short distance.  For finances, they do pay the bills together;  wife reports that in the past he had missed some payments but since they have been working together to pay the bills, they have not had any problems with missing payment. 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  He had recent lab work: His TSH is 1.64, B12 224, his previous HbA1c is 8.6.  His last brain  imaging was a CT of the head done in September 2020 and showed some cerebral atrophy some small vessel white matter disease but no acute intracranial pathology.     REVIEW OF SYSTEMS: Full 14 system review of systems performed and negative with exception of: as noted in the HPI  ALLERGIES: No Known Allergies  HOME MEDICATIONS: Outpatient Medications Prior to Visit  Medication Sig Dispense Refill   Ascorbic Acid (VITAMIN C PO) Take 1 tablet by mouth daily.      aspirin 81 MG tablet Take 81 mg by mouth daily.     blood glucose meter kit and supplies Dispense based on patient and insurance preference. Use up to four times daily as directed. (FOR ICD-10 E10.9, E11.9). 1 each 0   Blood Glucose Monitoring Suppl (BLOOD GLUCOSE METER) kit Use as instructed 1 each 0   Insulin Pen Needle (BD PEN NEEDLE NANO U/F) 32G X 4 MM MISC USE DAILY AS DIRECTED 100 each 2   LANTUS SOLOSTAR 100 UNIT/ML Solostar Pen INJECT 26 UNITS INTO THE SKIN DAILY. 28 mL 0   lisinopril (ZESTRIL) 5 MG tablet Take 1 tablet (5 mg total) by mouth daily. 90 tablet 1   metFORMIN (GLUCOPHAGE) 850 MG tablet Take 1 tablet (850 mg total) by mouth 2 (two) times daily with a meal. 180 tablet 1   glucose blood test strip Check blood sugar 3 times daily 100 each  4   atorvastatin (LIPITOR) 10 MG tablet Take 1 tablet (10 mg total) by mouth once a week. 30 tablet 0   No facility-administered medications prior to visit.    PAST MEDICAL HISTORY: Past Medical History:  Diagnosis Date   Diabetes mellitus without complication ([place])    GERD (gastroesophageal reflux disease)    no meds    PAST SURGICAL HISTORY: Past Surgical History:  Procedure Laterality Date   ABSCESS DRAINAGE     FRACTURE SURGERY     HARDWARE REMOVAL  03/26/2012   Procedure: HARDWARE REMOVAL;  Surgeon: [name], MD;  Location: [facility];  Service: Orthopedics;  Laterality: Left;  hardware removal deep left ankle syndesmotic screws only and  stress xrays ankle    ORIF ANKLE FRACTURE  12/03/2011   Procedure: OPEN REDUCTION INTERNAL FIXATION (ORIF) ANKLE FRACTURE;  Surgeon: [name], MD;  Location: [facility];  Service: Orthopedics;  Laterality: Left;  left medial malleolus fracture   PILONIDAL CYST EXCISION  ~1982   SKIN GRAFT  ~1970   Lt great toe (graft from Lt thigh)    FAMILY HISTORY: Family History  Problem Relation Age of Onset   Cancer Mother    Cancer Father     SOCIAL HISTORY: Social History   Socioeconomic History   Marital status: Married    Spouse name: Not on file   Number of children: 2   Years of education: Not on file   Highest education level: Not on file  Occupational History   Occupation: retired  Tobacco Use   Smoking status: Former    Types: Cigarettes    Quit date: 10/30/1988    Years since quitting: 32.6   Smokeless tobacco: Never  Vaping Use   Vaping Use: Never used  Substance and Sexual Activity   Alcohol use: Yes    Alcohol/week: 1.0 standard drink    Types: 1 Shots of liquor per week    Comment: every two weeks   Drug use: No   Sexual activity: Yes    Birth control/protection: None  Other Topics Concern   Not on file  Social History Narrative   Not on file   Social Determinants of Health   Financial Resource Strain: Not on file  Food Insecurity: Not on file  Transportation Needs: Not on file  Physical Activity: Not on file  Stress: Not on file  Social Connections: Not on file  Intimate Partner Violence: Not on file     PHYSICAL EXAM  GENERAL EXAM/CONSTITUTIONAL: Vitals:  Vitals:   05/29/21 1447  BP: 128/79  Pulse: 76  Weight: 258 lb (117 kg)  Height: 6' (1.829 m)   Body mass index is 34.99 kg/m. Wt Readings from Last 3 Encounters:  05/29/21 258 lb (117 kg)  04/24/21 256 lb 12.8 oz (116.5 kg)  03/28/21 254 lb 12.8 oz (115.6 kg)   Patient is in no distress; well developed, nourished and groomed; neck is  supple  CARDIOVASCULAR: Examination of carotid arteries is normal; no carotid bruits Regular rate and rhythm, no murmurs Examination of peripheral vascular system by observation and palpation is normal  EYES: Pupils round and reactive to light, Visual fields full to confrontation, Extraocular movements intacts,  No results found.  MUSCULOSKELETAL: Gait, strength, tone, movements noted in Neurologic exam below  NEUROLOGIC: MENTAL STATUS:  MMSE - Mini Mental State Exam 05/29/2021  Orientation to time 4  Orientation to Place 4  Registration 3  Attention/ Calculation 2  Recall 2  Language- name 2 objects 2  Language- repeat 1  Language- follow 3 step command 2  Language- follow 3 step command-comments handed paper back to me  Language- read & follow direction 0  Language-read & follow direction-comments did not close eyes  Write a sentence 1  Copy design 0  Total score 21   awake, alert, oriented to person, place and time recent and remote memory intact normal attention and concentration language fluent, comprehension intact, naming intact fund of knowledge appropriate  CRANIAL NERVE:  2nd, 3rd, 4th, 6th - pupils equal and reactive to light, visual fields full to confrontation, extraocular muscles intact, no nystagmus 5th - facial sensation symmetric 7th - facial strength symmetric 8th - hearing intact 9th - palate elevates symmetrically, uvula midline 11th - shoulder shrug symmetric 12th - tongue protrusion midline  MOTOR:  normal bulk and tone, full strength in the BUE, BLE  SENSORY:  normal and symmetric to light touch, pinprick, temperature, vibration  COORDINATION:  finger-nose-finger, fine finger movements normal  REFLEXES:  deep tendon reflexes present and symmetric  GAIT/STATION:  normal     DIAGNOSTIC DATA (LABS, IMAGING, TESTING) - I reviewed patient records, labs, notes, testing and imaging myself where available.  Lab Results  Component  Value Date   WBC 8.2 07/25/2019   HGB 12.2 (L) 07/25/2019   HCT 36.4 (L) 07/25/2019   MCV 94.3 07/25/2019   PLT 213 07/25/2019      Component Value Date/Time   NA 139 03/28/2021 1152   NA 138 01/19/2020 1118   K 4.4 03/28/2021 1152   CL 103 03/28/2021 1152   CO2 27 03/28/2021 1152   GLUCOSE 195 (H) 03/28/2021 1152   BUN 12 03/28/2021 1152   BUN 11 01/19/2020 1118   CREATININE 0.87 03/28/2021 1152   CREATININE 0.86 08/20/2015 0924   CALCIUM 9.3 03/28/2021 1152   PROT 7.2 03/28/2021 1152   PROT 7.3 01/19/2020 1118   ALBUMIN 4.1 03/28/2021 1152   ALBUMIN 4.3 01/19/2020 1118   AST 13 03/28/2021 1152   ALT 13 03/28/2021 1152   ALKPHOS 58 03/28/2021 1152   BILITOT 0.5 03/28/2021 1152   BILITOT 0.6 01/19/2020 1118   GFRNONAA 78 01/19/2020 1118   GFRNONAA >89 08/20/2015 0924   GFRAA 90 01/19/2020 1118   GFRAA >89 08/20/2015 0924   Lab Results  Component Value Date   CHOL 147 03/28/2021   HDL 34.50 (L) 03/28/2021   LDLCALC 90 03/28/2021   TRIG 116.0 03/28/2021   CHOLHDL 4 03/28/2021   Lab Results  Component Value Date   HGBA1C 8.5 (A) 03/28/2021   Lab Results  Component Value Date   VITAMINB12 324 04/24/2021   Lab Results  Component Value Date   TSH 1.64 04/24/2021    CT Head 07/2019: Cerebral atrophy, no acute intracranial pathology.     ASSESSMENT AND PLAN  70 y.o. year old male here with s th for concern of memory problem.  He does have a history of diabetes, hypertension, hyperlipidemia.  Wife does report that his memory problem has been going on for the past 6 months and is getting worse.  He has difficulty with remembering things and misplacing things.  Patient does recommend that he has issue with his memory  On exam today his score a 21 out of 30 for his Mini-Mental status exam, he did have issues with attention and recall.  Based on history and neurological examination patient likely have cognitive impairment.  He did have  recent blood work including  TSH, HgA1c and B12.  I did recommend patient to get a B12 supplement.  His hemoglobin A1c was 8.6 I also recommended better blood glucose control.   We will start him on Aricept 5 mg nightly.  We will also obtain a brain MRI to rule out any intracranial pathology that can explain his memory loss.  We will also obtain a routine EEG to rule out epileptiform activity as etiology of memory problem.  I will also refer him for formal Neuropsychological testing to get a baseline status. Return to clinic in 3 months or sooner if worse.     1. Cognitive impairment   2. Alzheimer's disease, unspecified (CODE) ([place])   3. Type 2 diabetes mellitus with hyperglycemia, with long-term current use of insulin ([facility])       PLAN:  Orders Placed This Encounter  Procedures   MR BRAIN WO CONTRAST   Ambulatory referral to Neuropsychology   EEG adult    Meds ordered this encounter  Medications   donepezil (ARICEPT) 5 MG tablet    Sig: Take 1 tablet (5 mg total) by mouth at bedtime.    Dispense:  90 tablet    Refill:  4   glucose blood test strip    Sig: Check blood sugar 3 times daily    Dispense:  100 each    Refill:  4    Dx: E11.65, Z79.4    You have been prescribed Aricept for your cognitive impairment. Please take 5 mg nightly EEG to rule out epileptiform activity as etiology of memory loss and confusion MRI of the brain to look for structural, demyelinating, inflammatory or other causes Neuropsychiatric testing   Follow up in 3 months    --------------------------------------------------------------------------------------------  There are well-accepted and sensible ways to reduce risk for Alzheimers disease and other degenerative brain disorders .  Exercise Daily Walk A daily 20 minute walk should be part of your routine. Disease related apathy can be a significant roadblock to exercise and the only way to overcome this is to make it a daily routine and perhaps have a reward at the end  (something your loved one loves to eat or drink perhaps) or a personal trainer coming to the home can also be very useful. Most importantly, the patient is much more likely to exercise if the caregiver / spouse does it with him/her. In general a structured, repetitive schedule is best.  General Health: Any diseases which effect your body will effect your brain such as a pneumonia, urinary infection, blood clot, heart attack or stroke. Keep contact with your primary care doctor for regular follow ups.  Sleep. A good nights sleep is healthy for the brain. Seven hours is recommended. If you have insomnia or poor sleep habits we can give you some instructions. If you have sleep apnea wear your mask.  Diet: Eating a heart healthy diet is also a good idea; fish and poultry instead of red meat, nuts (mostly non-peanuts), vegetables, fruits, olive oil or canola oil (instead of butter), minimal salt (use other spices to flavor foods), whole grain rice, bread, cereal and pasta and wine in moderation.Research is now showing that the MIND diet, which is a combination of The Mediterranean diet and the DASH diet, is beneficial for cognitive processing and longevity. Information about this diet can be found in The MIND Diet, a book by Doyne Keel, MS, RDN, and online at [employer].si  Finances, Power of Attorney and Advance Directives: You should  consider putting legal safeguards in place with regard to financial and medical decision making. While the spouse always has power of attorney for medical and financial issues in the absence of any form, you should consider what you want in case the spouse / caregiver is no longer around or capable of making decisions.     Return in about 3 months (around 08/29/2021).    [name], MD 05/29/2021, 4:26 PM  [facility] [address], [place], [place] [address] [phone]

## 2021-06-05 ENCOUNTER — Ambulatory Visit: Payer: Medicare Other | Admitting: Neurology

## 2021-06-09 ENCOUNTER — Ambulatory Visit
Admission: RE | Admit: 2021-06-09 | Discharge: 2021-06-09 | Disposition: A | Discharge status: Home / Self Care | Payer: Medicare Other | Source: Ambulatory Visit | Attending: Neurology | Admitting: Neurology

## 2021-06-09 ENCOUNTER — Other Ambulatory Visit: Payer: Self-pay

## 2021-06-09 DIAGNOSIS — G309 Alzheimer's disease, unspecified: Secondary | ICD-10-CM

## 2021-06-13 ENCOUNTER — Other Ambulatory Visit: Payer: Medicare Other

## 2021-06-24 ENCOUNTER — Telehealth: Payer: Self-pay

## 2021-06-24 DIAGNOSIS — Z9189 Other specified personal risk factors, not elsewhere classified: Secondary | ICD-10-CM

## 2021-06-24 NOTE — Progress Notes (Signed)
[facility])                                            [place] Team                                        Statin Quality Measure Assessment    06/24/2021  Edward Briggs 11-26-1950 [id number]  Per review of chart and payor information, this patient has been flagged for non-adherence to the following CMS Quality Measure:   '[x]'$  Statin Use in Persons with Diabetes  '[]'$  Statin Use in Persons with Cardiovascular Disease  The 10-year ASCVD risk score Edward Briggs DC Jr., et al., 2013) is: 32.7%   Values used to calculate the score:     Age: 70 years     Sex: Male     Is Non-Hispanic African American: Yes     Diabetic: Yes     Tobacco smoker: No     Systolic Blood Pressure: 0000000 mmHg     Is BP treated: Yes     HDL Cholesterol: 34.5 mg/dL     Total Cholesterol: 147 mg/dL LDL 90 mg/dL 03/28/2021  Currently prescribed statin:  '[x]'$  Yes '[]'$  No     Comments: Atorvastatin 10 mg once weekly. Given dosing regimen of atorvastatin, it is unclear if the patient has had a h/o statin intolerance vs taking low-dose (once weekly) d/t documented LDL. Per [facility], when I called on 04/15/2021, patient last picked up atorvastatin on 01/19/2020.Per [facility], atorvastatin is not on file. I called and spoke w/ Mr. Hardiman today, he was not aware of atorvastatin medication when I initially asked him about it. However, he later informed me that he ran out of refills but doesn't know when this happened. Patient denies compliance issues with his other medications and doesn't miss doses. He manages his own medications and does not use a pillbox.   Per recent O/v note w/ PCP pt did have complaints of memory issues in which he was seen by neurology.   Please consider ONE of the following recommendations:   Initiate high intensity statin Atorvastatin '40mg'$  once daily, #90, 3 refills   Rosuvastatin '20mg'$  once daily, #90, 3 refills    Initiate moderate  intensity          statin with reduced frequency if prior          statin intolerance 1x weekly, #13, 3 refills   2x weekly, #26, 3 refills   3x weekly, #39, 3 refills    Code for past statin intolerance or other exclusions (required annually)  Drug Induced Myopathy G72.0   Myositis, unspecified M60.9   Rhabdomyolysis M62.82   Prediabetes R73.03   Adverse effect of antihyperlipidemic and antiarteriosclerotic drugs, initial encounter [id number]    Thank you for your time,  Kristeen Miss, [place] Cell: [phone]

## 2021-06-27 ENCOUNTER — Ambulatory Visit (INDEPENDENT_AMBULATORY_CARE_PROVIDER_SITE_OTHER): Payer: Medicare Other | Admitting: Family Medicine

## 2021-06-27 ENCOUNTER — Encounter: Payer: Self-pay | Admitting: Family Medicine

## 2021-06-27 ENCOUNTER — Other Ambulatory Visit: Payer: Self-pay

## 2021-06-27 VITALS — BP 132/78 | HR 87 | Temp 98.3°F | Resp 17 | Ht 72.0 in | Wt 258.2 lb

## 2021-06-27 DIAGNOSIS — R413 Other amnesia: Secondary | ICD-10-CM

## 2021-06-27 DIAGNOSIS — E1165 Type 2 diabetes mellitus with hyperglycemia: Secondary | ICD-10-CM

## 2021-06-27 DIAGNOSIS — Z9189 Other specified personal risk factors, not elsewhere classified: Secondary | ICD-10-CM | POA: Diagnosis not present

## 2021-06-27 DIAGNOSIS — I10 Essential (primary) hypertension: Secondary | ICD-10-CM | POA: Diagnosis not present

## 2021-06-27 DIAGNOSIS — Z794 Long term (current) use of insulin: Secondary | ICD-10-CM | POA: Diagnosis not present

## 2021-06-27 LAB — POCT GLYCOSYLATED HEMOGLOBIN (HGB A1C): Hemoglobin A1C: 8.1 % — AB (ref 4.0–5.6)

## 2021-06-27 LAB — COMPREHENSIVE METABOLIC PANEL
ALT: 12 U/L (ref 0–53)
AST: 14 U/L (ref 0–37)
Albumin: 3.8 g/dL (ref 3.5–5.2)
Alkaline Phosphatase: 61 U/L (ref 39–117)
BUN: 15 mg/dL (ref 6–23)
CO2: 27 mEq/L (ref 19–32)
Calcium: 9.1 mg/dL (ref 8.4–10.5)
Chloride: 102 mEq/L (ref 96–112)
Creatinine, Ser: 0.88 mg/dL (ref 0.40–1.50)
GFR: 87.63 mL/min (ref >60.00)
Glucose, Bld: 210 mg/dL — ABNORMAL HIGH (ref 70–99)
Potassium: 4.7 mEq/L (ref 3.5–5.1)
Sodium: 138 mEq/L (ref 135–145)
Total Bilirubin: 0.5 mg/dL (ref 0.2–1.2)
Total Protein: 6.9 g/dL (ref 6.0–8.3)

## 2021-06-27 MED ORDER — METFORMIN HCL 850 MG PO TABS: 850.0000 mg | ORAL_TABLET | Freq: Two times a day (BID) | ORAL | 1 refills | Status: DC

## 2021-06-27 MED ORDER — LISINOPRIL 5 MG PO TABS: 5.0000 mg | ORAL_TABLET | Freq: Every day | ORAL | 1 refills | Status: DC

## 2021-06-27 MED ORDER — ATORVASTATIN CALCIUM 10 MG PO TABS: 10.0000 mg | ORAL_TABLET | Freq: Every day | ORAL | 0 refills | Status: DC

## 2021-06-27 MED ORDER — LANTUS SOLOSTAR 100 UNIT/ML ~~LOC~~ SOPN: 30.0000 [IU] | PEN_INJECTOR | Freq: Every day | SUBCUTANEOUS | 1 refills | Status: DC

## 2021-06-27 NOTE — Patient Instructions (Addendum)
Lantus insulin 30 units per day. Continue metformin twice per day.  I recommend meeting with your eye specialist about the floaters.  Start lipitor (cholesterol med) once per week, then increase up to once per day if tolerated. If new side effects let me know.  Keep follow up with memory specialist, continue donepezil for now. Walking and low intensity exercise is great and word puzzles, crosswords may help as well.   Recheck in 3 months.   Type 2 Diabetes Mellitus, Self-Care, Adult Caring for yourself after you have been diagnosed with type 2 diabetes (type 2 diabetes mellitus) means keeping your blood sugar (glucose) under control with a balance of: Nutrition. Exercise. Lifestyle changes. Medicines or insulin, if needed. Support from your team of health care providers and others. The following information explains what you need to know to manage yourdiabetes at home. What are the risks? Having diabetes can put you at risk for other long-term (chronic) conditions, such as heart disease and kidney disease. Your health careprovider may prescribe medicines to help prevent complications from diabetes. How to monitor blood glucose  Check your blood glucose every day, as often as told by your health care provider. Have your A1C (hemoglobin A1C) level checked two or more times a year, or as often as told by your health care provider. Your health care provider will set personalized treatment goals for you. Generally, the goal of treatment is to maintain the following blood glucose levels: Before meals: 80-130 mg/dL (4.4-7.2 mmol/L). After meals: below 180 mg/dL (10 mmol/L). A1C level: less than 7%. How to manage hyperglycemia and hypoglycemia Hyperglycemia symptoms Hyperglycemia, also called high blood glucose, occurs when blood glucose is too high. Make sure you know the early signs of hyperglycemia, such as: Increased thirst. Hunger. Feeling very tired. Needing to urinate more often than  usual. Blurry vision. Hypoglycemia symptoms Hypoglycemia, also called low blood glucose, occurs with a blood glucose level at or below 70 mg/dL (3.9 mmol/L). Diabetes medicines lower your blood glucose and can cause hypoglycemia. The risk for hypoglycemia increases during or after exercise, during sleep, during illness, and when skipping meals or not eating for a long time (fasting). It is important to know the symptoms of hypoglycemia and treat it right away. Always have a 15-gram rapid-acting carbohydrate snack with you to treat low blood glucose. Family members and close friends should also know the symptoms and understand how to treat hypoglycemia, in case you are not able to treat yourself. Symptoms may include: Hunger. Anxiety. Sweating and feeling clammy. Dizziness or feeling light-headed. Sleepiness. Increased heart rate. Irritability. Tingling or numbness around the mouth, lips, or tongue. Restless sleep. Severe hypoglycemia is when your blood glucose level is at or below 54 mg/dL (3 mmol/L). Severe hypoglycemia is an emergency. Do not wait to see if the symptoms will go away. Get medical help right away. Call your local emergency services (911 in the [place] Do not drive yourself to the hospital. If you have severe hypoglycemia and you cannot eat or drink, you may need glucagon. A family member or close friend should learn how to check your blood glucose and how to give you glucagon. Ask your health care provider if you needto have an emergency glucagon kit available. Follow these instructions at home: Medicines Take diabetes medicines as told by your health care provider. If your health care provider prescribed insulin or diabetes medicines, take them every day. Do not run out of insulin or other diabetes medicines. Plan ahead so you  always have these available. If you use insulin, adjust your dosage based on your physical activity and what foods you eat. Your health care provider will  tell you how to adjust your dosage. Take over-the-counter and prescription medicines only as told by your health care provider. Eating and drinking  What you eat and drink affects your blood glucose and your insulin dosage. Making good choices helps to control your diabetes and prevent other health problems. A healthy meal plan includes eating lean proteins, complex carbohydrates, fresh fruits and vegetables, low-fat dairy products, and healthyfats. Make an appointment to see a registered dietitian to help you create an eating plan that is right for you. Make sure that you: Follow instructions from your health care provider about eating or drinking restrictions. Drink enough fluid to keep your urine pale yellow. Keep a record of the carbohydrates that you eat. Do this by reading food labels and learning the standard serving sizes of foods. Follow your sick-day plan whenever you cannot eat or drink as usual. Make this plan in advance with your health care provider.  Activity Stay active. Exercise regularly, as told by your health care provider. This may include: Stretching and doing strength exercises, such as yoga or weight lifting, 2 or more times a week. Doing 150 minutes or more of moderate-intensity or vigorous-intensity exercise each week. This could be brisk walking, biking, or water aerobics. Spread out your activity over 3 or more days of the week. Do not go more than 2 days in a row without doing some kind of physical activity. When you start a new exercise or activity, work with your health care provider to adjust your insulin, medicines, or food intake as needed. Lifestyle Do not use any products that contain nicotine or tobacco, such as cigarettes, e-cigarettes, and chewing tobacco. If you need help quitting, ask your health care provider. If your health care provider says that alcohol is safe for you, limit how much you use to no more than 1 drink a day for women who are not pregnant  and 2 drinks a day for men. In the [place] one drink equals one 12 oz bottle of beer (355 mL), one 5 oz glass of wine (148 mL), or one 1 oz glass of hard liquor (44 mL). Learn to manage stress. If you need help with this, ask your health care provider. Take care of your body  Keep your immunizations up to date. In addition to getting vaccinations as told by your health care provider, it is recommended that you get vaccinated against the following illnesses: The flu (influenza). Get a flu shot every year. Pneumonia. Hepatitis B. Schedule an eye exam soon after your diagnosis, and then one time every year after that. Check your skin and feet every day for cuts, bruises, redness, blisters, or sores. Schedule a foot exam with your health care provider once every year. Brush your teeth and gums two times a day, and floss one or more times a day. Visit your dentist one or more times every 6 months. Maintain a healthy weight.  General instructions Share your diabetes management plan with people in your workplace, school, and household. Carry a medical alert card or wear medical alert jewelry. Keep all follow-up visits as told by your health care provider. This is important. Questions to ask your health care provider Should I meet with a certified diabetes care and education specialist? Where can I find a support group for people with diabetes? Where to find more  information American Diabetes Association (ADA): www.diabetes.org American Association of Diabetes Care and Education Specialists (ADCES): www.diabeteseducator.org International Diabetes Federation (IDF): MemberVerification.ca Summary Caring for yourself after you have been diagnosed with type 2 diabetes (type 2 diabetes mellitus) means keeping your blood sugar (glucose) under control with a balance of nutrition, exercise, lifestyle changes, and medicine. Check your blood glucose every day, as often as told by your health care provider. Having  diabetes can put you at risk for other long-term (chronic) conditions, such as heart disease and kidney disease. Your health care provider may prescribe medicines to help prevent complications from diabetes. Share your diabetes management plan with people in your workplace, school, and household. Keep all follow-up visits as told by your health care provider. This is important. This information is not intended to replace advice given to you by your health care provider. Make sure you discuss any questions you have with your healthcare provider. Document Revised: 11/28/2019 Document Reviewed: 11/29/2019 [employer] Patient Education  2021 [employer].

## 2021-06-27 NOTE — Progress Notes (Signed)
Subjective:  Patient ID: Edward Briggs, male    DOB: 05-16-1951  Age: 70 y.o. MRN: [id number]  CC:  Chief Complaint  Patient presents with   Diabetes    Pt here for 2 month recheck diabetes per last note pt reports home glucose 120-160   Medication Refill    Pt in need of lisinopril refill as this will be due before next follow up     HPI Edward Briggs presents for   Diabetes: With hyperglycemia, uncontrolled.  Last discussed in June, Lantus was increased to 28 units at his May visit, then to 30 units at his June visit for persistent hyperglycemia.  Continued metformin 850 mg twice daily.  He has remained on 28 units lantus.  On ACE inhibitor. . Fasting: 130-170 2 hr PP: none.  No symptomatic lows.  Microalbumin: nl ratio on 5/26.  Optho, foot exam, pneumovax:  Up to date.   Lab Results  Component Value Date   HGBA1C 8.1 (A) 06/27/2021   HGBA1C 8.5 (A) 03/28/2021   HGBA1C 8.8 (H) 01/19/2020   Lab Results  Component Value Date   MICROALBUR <0.7 03/28/2021   [place] 90 03/28/2021   CREATININE 0.87 03/28/2021   Memory issues: Eval by neuro on 727/23. Diagnosed with alzheimers, cognitive impairment, started on aricept 42m QD.some floaters, no difficulty with vision.  MRI brain 8/7, plan for EEG.    IMPRESSION: This MRI of the brain without contrast shows the following: 1.   Mild generalized cortical atrophy, progressed compared to the 2020 CT scan. 2.   Some scattered T2/FLAIR hyperintense foci in the hemispheres consistent with minimal chronic microvascular ischemic change, typical for age. 3.   Chronic maxillary sinusitis. 4.   No acute findings.   Hypertension: Lisinopril 574mqd.  Home readings: none.  BP Readings from Last 3 Encounters:  06/27/21 132/78  05/29/21 128/79  04/24/21 132/76   Lab Results  Component Value Date   CREATININE 0.87 03/28/2021   The 10-year ASCVD risk score (GMikey BussingC Jr., et al., 2013) is: 34.3%   Values used to calculate the  score:     Age: [age]     Sex: Male     Is Non-Hispanic African American: Yes     Diabetic: Yes     Tobacco smoker: No     Systolic Blood Pressure: 13858mHg     Is BP treated: Yes     HDL Cholesterol: 34.5 mg/dL     Total Cholesterol: 147 mg/dL    History Patient Active Problem List   Diagnosis Date Noted   HTN (hypertension) 0985/12/7739 Acute metabolic encephalopathy 0928/78/6767 AKI (acute kidney injury) ([facility]/18/2020   Sepsis ([facility]/18/[place]   UTI (urinary tract infection) 07/22/2019   Diabetes [facility] without complication ([facility]120/[facility]/7096 CHEST PAIN 07/21/2009   Past Medical History:  Diagnosis Date   Diabetes mellitus without complication (HCC)    GERD (gastroesophageal reflux disease)    no meds   Past Surgical History:  Procedure Laterality Date   ABSCESS DRAINAGE     FRACTURE SURGERY     HARDWARE REMOVAL  03/26/2012   Procedure: HARDWARE REMOVAL;  Surgeon: PaColin RheinMD;  [facility];  Laterality: Left;  hardware removal deep left ankle syndesmotic screws only and stress xrays ankle    ORIF ANKLE FRACTURE  12/03/2011   Procedure: OPEN REDUCTION INTERNAL FIXATION (ORIF) ANKLE FRACTURE;  Surgeon: PaDorene Ar  Beola Cord, MD;  Location: [place];  Service: Orthopedics;  Laterality: Left;  left medial malleolus fracture   PILONIDAL CYST EXCISION  ~1982   SKIN GRAFT  ~1970   Lt great toe (graft from Lt thigh)   No Known Allergies Prior to Admission medications   Medication Sig Start Date End Date Taking? Authorizing Provider  Ascorbic Acid (VITAMIN C PO) Take 1 tablet by mouth daily.    Yes [provider]  aspirin 81 MG tablet Take 81 mg by mouth daily.   Yes [provider]  blood glucose meter kit and supplies Dispense based on patient and insurance preference. Use up to four times daily as directed. (FOR ICD-10 E10.9, E11.9). 07/25/19  Yes [name], MD  Blood Glucose  Monitoring Suppl (BLOOD GLUCOSE METER) kit Use as instructed 09/10/12  Yes [name], MD  donepezil (ARICEPT) 5 MG tablet Take 1 tablet (5 mg total) by mouth at bedtime. 05/29/21 08/27/21 Yes Camara, Maryan Puls, MD  glucose blood test strip Check blood sugar 3 times daily 05/29/21  Yes Camara, Amadou, MD  Insulin Pen Needle (BD PEN NEEDLE NANO U/F) 32G X 4 MM MISC USE DAILY AS DIRECTED 03/28/21  Yes [name], MD  LANTUS SOLOSTAR 100 UNIT/ML Solostar Pen INJECT 26 UNITS INTO THE SKIN DAILY. 04/24/21  Yes [name], MD  lisinopril (ZESTRIL) 5 MG tablet Take 1 tablet (5 mg total) by mouth daily. 03/28/21  Yes [name], MD  metFORMIN (GLUCOPHAGE) 850 MG tablet Take 1 tablet (850 mg total) by mouth 2 (two) times daily with a meal. 03/28/21  Yes [name], MD   Social History   Socioeconomic History   Marital status: Married    Spouse name: Not on file   Number of children: 2   Years of education: Not on file   Highest education level: Not on file  Occupational History   Occupation: retired  Tobacco Use   Smoking status: Former    Types: Cigarettes    Quit date: 10/30/1988    Years since quitting: 32.6   Smokeless tobacco: Never  Vaping Use   Vaping Use: Never used  Substance and Sexual Activity   Alcohol use: Yes    Alcohol/week: 1.0 standard drink    Types: 1 Shots of liquor per week    Comment: every two weeks   Drug use: No   Sexual activity: Yes    Birth control/protection: None  Other Topics Concern   Not on file  Social History Narrative   Not on file   Social Determinants of Health   Financial Resource Strain: Not on file  Food Insecurity: Not on file  Transportation Needs: Not on file  Physical Activity: Not on file  Stress: Not on file  Social Connections: Not on file  Intimate Partner Violence: Not on file    Review of Systems  Constitutional:  Negative for fatigue and unexpected weight change.  Eyes:  Negative for visual  disturbance.  Respiratory:  Negative for cough, chest tightness and shortness of breath.   Cardiovascular:  Negative for chest pain, palpitations and leg swelling.  Gastrointestinal:  Negative for abdominal pain and blood in stool.  Neurological:  Negative for dizziness, light-headedness and headaches.    Objective:   Vitals:   06/27/21 0948  BP: 132/78  Pulse: 87  Resp: 17  Temp: 98.3 F (36.8 C)  TempSrc: Temporal  SpO2: 95%  Weight: 258 lb 3.2 oz (117.1 kg)  Height: 6' (1.829 m)     Physical Exam Vitals reviewed.  Constitutional:      Appearance: He is well-developed.  HENT:     Head: Normocephalic and atraumatic.  Neck:     Vascular: No carotid bruit or JVD.  Cardiovascular:     Rate and Rhythm: Normal rate and regular rhythm.     Heart sounds: Normal heart sounds. No murmur heard. Pulmonary:     Effort: Pulmonary effort is normal.     Breath sounds: Normal breath sounds. No rales.  Musculoskeletal:     Right lower leg: No edema.     Left lower leg: No edema.  Skin:    General: Skin is warm and dry.  Neurological:     Mental Status: He is alert and oriented to person, place, and time.  Psychiatric:        Mood and Affect: Mood normal.    Results for orders placed or performed in visit on 06/27/21  POCT glycosylated hemoglobin (Hb A1C)  Result Value Ref Range   Hemoglobin A1C 8.1 (A) 4.0 - 5.6 %   HbA1c POC (<> result, manual entry)     HbA1c, POC (prediabetic range)     HbA1c, POC (controlled diabetic range)        Assessment & Plan:  Edward Briggs is a 70 y.o. male . Type 2 diabetes mellitus with hyperglycemia, with long-term current use of insulin (HCC) - Plan: metFORMIN (GLUCOPHAGE) 850 MG tablet, Comprehensive metabolic panel, insulin glargine (LANTUS SOLOSTAR) 100 UNIT/ML Solostar Pen, atorvastatin (LIPITOR) 10 MG tablet, POCT glycosylated hemoglobin (Hb A1C)  -Uncontrolled with hyperglycemia, will increase Lantus to 30 units as previously  planned.  Hypoglycemia precautions have been discussed.  No other med changes for now.  70-monthfollow-up.  Essential hypertension - Plan: lisinopril (ZESTRIL) 5 MG tablet  -Stable, continue lisinopril  At increased risk for cardiovascular disease - Plan: atorvastatin (LIPITOR) 10 MG tablet  -Rationale for statins discussed, as well as potential side effects and risks.  Start Lipitor initially once per week then increase to daily as tolerated. All questions were answered.  Memory changes Neurology notes reviewed as well as MRI as above.  Overall tolerating donepezil but some reported increased floaters, recommended follow-up with his eye specialist.  Continue work-up and follow-up with neurology.  Meds ordered this encounter  Medications   metFORMIN (GLUCOPHAGE) 850 MG tablet    Sig: Take 1 tablet (850 mg total) by mouth 2 (two) times daily with a meal.    Dispense:  180 tablet    Refill:  1   lisinopril (ZESTRIL) 5 MG tablet    Sig: Take 1 tablet (5 mg total) by mouth daily.    Dispense:  90 tablet    Refill:  1   insulin glargine (LANTUS SOLOSTAR) 100 UNIT/ML Solostar Pen    Sig: Inject 30 Units into the skin daily.    Dispense:  36 mL    Refill:  1   atorvastatin (LIPITOR) 10 MG tablet    Sig: Take 1 tablet (10 mg total) by mouth daily. Can start once per week and increase to daily if tolerated.    Dispense:  90 tablet    Refill:  0   Patient Instructions  Lantus insulin 30 units per day. Continue metformin twice per day.  I recommend meeting with your eye specialist about the floaters.  Start lipitor (cholesterol med) once per week, then increase up to once per day if tolerated. If new  side effects let me know.  Keep follow up with memory specialist, continue donepezil for now. Walking and low intensity exercise is great and word puzzles, crosswords may help as well.   Recheck in 3 months.   Type 2 Diabetes Mellitus, Self-Care, Adult Caring for yourself after you have been  diagnosed with type 2 diabetes (type 2 diabetes mellitus) means keeping your blood sugar (glucose) under control with a balance of: Nutrition. Exercise. Lifestyle changes. Medicines or insulin, if needed. Support from your team of health care providers and others. The following information explains what you need to know to manage yourdiabetes at home. What are the risks? Having diabetes can put you at risk for other long-term (chronic) conditions, such as heart disease and kidney disease. Your health careprovider may prescribe medicines to help prevent complications from diabetes. How to monitor blood glucose  Check your blood glucose every day, as often as told by your health care provider. Have your A1C (hemoglobin A1C) level checked two or more times a year, or as often as told by your health care provider. Your health care provider will set personalized treatment goals for you. Generally, the goal of treatment is to maintain the following blood glucose levels: Before meals: 80-130 mg/dL (4.4-7.2 mmol/L). After meals: below 180 mg/dL (10 mmol/L). A1C level: less than 7%. How to manage hyperglycemia and hypoglycemia Hyperglycemia symptoms Hyperglycemia, also called high blood glucose, occurs when blood glucose is too high. Make sure you know the early signs of hyperglycemia, such as: Increased thirst. Hunger. Feeling very tired. Needing to urinate more often than usual. Blurry vision. Hypoglycemia symptoms Hypoglycemia, also called low blood glucose, occurs with a blood glucose level at or below 70 mg/dL (3.9 mmol/L). Diabetes medicines lower your blood glucose and can cause hypoglycemia. The risk for hypoglycemia increases during or after exercise, during sleep, during illness, and when skipping meals or not eating for a long time (fasting). It is important to know the symptoms of hypoglycemia and treat it right away. Always have a 15-gram rapid-acting carbohydrate snack with you to  treat low blood glucose. Family members and close friends should also know the symptoms and understand how to treat hypoglycemia, in case you are not able to treat yourself. Symptoms may include: Hunger. Anxiety. Sweating and feeling clammy. Dizziness or feeling light-headed. Sleepiness. Increased heart rate. Irritability. Tingling or numbness around the mouth, lips, or tongue. Restless sleep. Severe hypoglycemia is when your blood glucose level is at or below 54 mg/dL (3 mmol/L). Severe hypoglycemia is an emergency. Do not wait to see if the symptoms will go away. Get medical help right away. Call your local emergency services (911 in the [place] Do not drive yourself to the hospital. If you have severe hypoglycemia and you cannot eat or drink, you may need glucagon. A family member or close friend should learn how to check your blood glucose and how to give you glucagon. Ask your health care provider if you needto have an emergency glucagon kit available. Follow these instructions at home: Medicines Take diabetes medicines as told by your health care provider. If your health care provider prescribed insulin or diabetes medicines, take them every day. Do not run out of insulin or other diabetes medicines. Plan ahead so you always have these available. If you use insulin, adjust your dosage based on your physical activity and what foods you eat. Your health care provider will tell you how to adjust your dosage. Take over-the-counter and prescription medicines only as  told by your health care provider. Eating and drinking  What you eat and drink affects your blood glucose and your insulin dosage. Making good choices helps to control your diabetes and prevent other health problems. A healthy meal plan includes eating lean proteins, complex carbohydrates, fresh fruits and vegetables, low-fat dairy products, and healthyfats. Make an appointment to see a registered dietitian to help you create an  eating plan that is right for you. Make sure that you: Follow instructions from your health care provider about eating or drinking restrictions. Drink enough fluid to keep your urine pale yellow. Keep a record of the carbohydrates that you eat. Do this by reading food labels and learning the standard serving sizes of foods. Follow your sick-day plan whenever you cannot eat or drink as usual. Make this plan in advance with your health care provider.  Activity Stay active. Exercise regularly, as told by your health care provider. This may include: Stretching and doing strength exercises, such as yoga or weight lifting, 2 or more times a week. Doing 150 minutes or more of moderate-intensity or vigorous-intensity exercise each week. This could be brisk walking, biking, or water aerobics. Spread out your activity over 3 or more days of the week. Do not go more than 2 days in a row without doing some kind of physical activity. When you start a new exercise or activity, work with your health care provider to adjust your insulin, medicines, or food intake as needed. Lifestyle Do not use any products that contain nicotine or tobacco, such as cigarettes, e-cigarettes, and chewing tobacco. If you need help quitting, ask your health care provider. If your health care provider says that alcohol is safe for you, limit how much you use to no more than 1 drink a day for women who are not pregnant and 2 drinks a day for men. In the [place] one drink equals one 12 oz bottle of beer (355 mL), one 5 oz glass of wine (148 mL), or one 1 oz glass of hard liquor (44 mL). Learn to manage stress. If you need help with this, ask your health care provider. Take care of your body  Keep your immunizations up to date. In addition to getting vaccinations as told by your health care provider, it is recommended that you get vaccinated against the following illnesses: The flu (influenza). Get a flu shot every  year. Pneumonia. Hepatitis B. Schedule an eye exam soon after your diagnosis, and then one time every year after that. Check your skin and feet every day for cuts, bruises, redness, blisters, or sores. Schedule a foot exam with your health care provider once every year. Brush your teeth and gums two times a day, and floss one or more times a day. Visit your dentist one or more times every 6 months. Maintain a healthy weight.  General instructions Share your diabetes management plan with people in your workplace, school, and household. Carry a medical alert card or wear medical alert jewelry. Keep all follow-up visits as told by your health care provider. This is important. Questions to ask your health care provider Should I meet with a certified diabetes care and education specialist? Where can I find a support group for people with diabetes? Where to find more information American Diabetes Association (ADA): www.diabetes.org American Association of Diabetes Care and Education Specialists (ADCES): www.diabeteseducator.org International Diabetes Federation (IDF): MemberVerification.ca Summary Caring for yourself after you have been diagnosed with type 2 diabetes (type 2 diabetes mellitus) means keeping  your blood sugar (glucose) under control with a balance of nutrition, exercise, lifestyle changes, and medicine. Check your blood glucose every day, as often as told by your health care provider. Having diabetes can put you at risk for other long-term (chronic) conditions, such as heart disease and kidney disease. Your health care provider may prescribe medicines to help prevent complications from diabetes. Share your diabetes management plan with people in your workplace, school, and household. Keep all follow-up visits as told by your health care provider. This is important. This information is not intended to replace advice given to you by your health care provider. Make sure you discuss any questions  you have with your healthcare provider. Document Revised: 11/28/2019 Document Reviewed: 11/29/2019 Elsevier Patient Education  2021 [place],   [name], MD [place], [facility] 06/27/21 1:03 PM

## 2021-07-04 ENCOUNTER — Ambulatory Visit: Payer: Medicare Other

## 2021-07-04 ENCOUNTER — Other Ambulatory Visit: Payer: Self-pay

## 2021-07-04 DIAGNOSIS — R410 Disorientation, unspecified: Secondary | ICD-10-CM

## 2021-07-04 DIAGNOSIS — R4189 Other symptoms and signs involving cognitive functions and awareness: Secondary | ICD-10-CM

## 2021-07-09 NOTE — Progress Notes (Signed)
I called and spoke with patient and spouse Edward Briggs about the EEG results showing generalized slowing.  No further action needed. He continues with Aricept 5 mg nightly and follow up as directed.   [name], MD

## 2021-07-09 NOTE — Procedures (Signed)
    History:  85 yom  with memory problems.   EEG classification: Normal awake and asleep  Description of the recording: The background rhythms of this recording consists of diffuse theta slowing with activity of 5-7 Hz. As the record progresses, the patient initially is in the waking state, but appears to enter the early stage II sleep during the recording, with rudimentary sleep spindles and vertex sharp wave activity seen. During the wakeful state, photic stimulation is performed, and no abnormal responses were seen. Hyperventilation was also performed, no abnormal response seen. No epileptiform discharges seen during this recording. There was generalized slowing.  EKG monitor shows no evidence of cardiac rhythm abnormalities with a heart rate of 66 bpm.  Impression: This is an abnormal EEG recording in the waking and sleeping state due to diffuse generalized slowing. No interictal epileptiform discharges were seen at any time during the recording.  Diffuse slowing is consistent with non specific generalized brain dysfunction such as encephalopathy. No seizures captured.    [name], MD [facility]

## 2021-07-19 ENCOUNTER — Other Ambulatory Visit: Payer: Self-pay

## 2021-07-19 ENCOUNTER — Telehealth (INDEPENDENT_AMBULATORY_CARE_PROVIDER_SITE_OTHER): Payer: Medicare Other | Admitting: Family

## 2021-07-19 DIAGNOSIS — U071 COVID-19: Secondary | ICD-10-CM

## 2021-07-19 MED ORDER — MOLNUPIRAVIR EUA 200MG CAPSULE: 4.0000 | ORAL_CAPSULE | Freq: Two times a day (BID) | ORAL | 0 refills | Status: AC

## 2021-07-19 NOTE — Progress Notes (Signed)
Edward Briggs is a 70 y.o. male with the following history as recorded in EpicCare:  Patient Active Problem List   Diagnosis Date Noted   HTN (hypertension) 66/59/9357   Acute metabolic encephalopathy 01/77/9390   AKI (acute kidney injury) ([place]) 07/22/2019   Sepsis ([place]) 07/22/2019   UTI (urinary tract infection) 07/22/2019   Diabetes mellitus without complication ([place]) 30/07/2329   CHEST PAIN 07/21/2009    Current Outpatient Medications  Medication Sig Dispense Refill   Ascorbic Acid (VITAMIN C PO) Take 1 tablet by mouth daily.      aspirin 81 MG tablet Take 81 mg by mouth daily.     atorvastatin (LIPITOR) 10 MG tablet Take 1 tablet (10 mg total) by mouth daily. Can start once per week and increase to daily if tolerated. 90 tablet 0   blood glucose meter kit and supplies Dispense based on patient and insurance preference. Use up to four times daily as directed. (FOR ICD-10 E10.9, E11.9). 1 each 0   Blood Glucose Monitoring Suppl (BLOOD GLUCOSE METER) kit Use as instructed 1 each 0   donepezil (ARICEPT) 5 MG tablet Take 1 tablet (5 mg total) by mouth at bedtime. 90 tablet 4   glucose blood test strip Check blood sugar 3 times daily 100 each 4   insulin glargine (LANTUS SOLOSTAR) 100 UNIT/ML Solostar Pen Inject 30 Units into the skin daily. 36 mL 1   Insulin Pen Needle (BD PEN NEEDLE NANO U/F) 32G X 4 MM MISC USE DAILY AS DIRECTED 100 each 2   lisinopril (ZESTRIL) 5 MG tablet Take 1 tablet (5 mg total) by mouth daily. 90 tablet 1   metFORMIN (GLUCOPHAGE) 850 MG tablet Take 1 tablet (850 mg total) by mouth 2 (two) times daily with a meal. 180 tablet 1   molnupiravir EUA (LAGEVRIO) 200 mg CAPS capsule Take 4 capsules (800 mg total) by mouth 2 (two) times daily for 5 days. 40 capsule 0   No current facility-administered medications for this visit.    Allergies: Patient has no known allergies.  Past Medical History:  Diagnosis Date   Diabetes mellitus without complication ([place])    GERD  (gastroesophageal reflux disease)    no meds    Past Surgical History:  Procedure Laterality Date   ABSCESS DRAINAGE     FRACTURE SURGERY     HARDWARE REMOVAL  03/26/2012   Procedure: HARDWARE REMOVAL;  Surgeon: [name], MD;  Location: [place];  Service: Orthopedics;  Laterality: Left;  hardware removal deep left ankle syndesmotic screws only and stress xrays ankle    ORIF ANKLE FRACTURE  12/03/2011   Procedure: OPEN REDUCTION INTERNAL FIXATION (ORIF) ANKLE FRACTURE;  Surgeon: [name], MD;  Location: [place];  Service: Orthopedics;  Laterality: Left;  left medial malleolus fracture   PILONIDAL CYST EXCISION  ~1982   SKIN GRAFT  ~1970   Lt great toe (graft from Lt thigh)    Family History  Problem Relation Age of Onset   Cancer Mother    Cancer Father     Social History   Tobacco Use   Smoking status: Former    Types: Cigarettes    Quit date: 10/30/1988    Years since quitting: 32.7   Smokeless tobacco: Never  Substance Use Topics   Alcohol use: Yes    Alcohol/week: 1.0 standard drink    Types: 1 Shots of liquor per week    Comment: every two weeks  Subjective:    I connected with [name] on 07/19/21 at  2:40 PM EDT by a video enabled telemedicine application and verified that I am speaking with the correct person using two identifiers.   I discussed the limitations of evaluation and management by telemedicine and the availability of in person appointments. The patient expressed understanding and agreed to proceed.  Provider in office/ patient is at home; provider and patient are only 2 people on video call.    Started with symptoms on 07/18/21- cough/ runny nose; COVID + on 07/19/21; would like to discuss anti-virals; no chest pain, no shortness of breath;     Objective:  There were no vitals filed for this visit.  General: Well developed, well nourished, in no acute distress  Skin : Warm and dry.  Head:  Normocephalic and atraumatic  Lungs: Respirations unlabored;  Neurologic: Alert and oriented; speech intact; face symmetrical;   Assessment:  1. COVID-19     Plan:  Rx for Molnupiravir- take as directed; symptomatic relief discussed; quarantine guidelines discussed; increase fluids, rest and follow up worse, no better.   No follow-ups on file.  No orders of the defined types were placed in this encounter.   Requested Prescriptions   Signed Prescriptions Disp Refills   molnupiravir EUA (LAGEVRIO) 200 mg CAPS capsule 40 capsule 0    Sig: Take 4 capsules (800 mg total) by mouth 2 (two) times daily for 5 days.

## 2021-07-22 ENCOUNTER — Ambulatory Visit: Payer: Medicare Other | Admitting: Podiatry

## 2021-09-04 ENCOUNTER — Ambulatory Visit (INDEPENDENT_AMBULATORY_CARE_PROVIDER_SITE_OTHER): Payer: Medicare Other | Admitting: Podiatry

## 2021-09-04 ENCOUNTER — Other Ambulatory Visit: Payer: Self-pay

## 2021-09-04 ENCOUNTER — Encounter: Payer: Self-pay | Admitting: Podiatry

## 2021-09-04 DIAGNOSIS — M79675 Pain in left toe(s): Secondary | ICD-10-CM

## 2021-09-04 DIAGNOSIS — M79674 Pain in right toe(s): Secondary | ICD-10-CM

## 2021-09-04 DIAGNOSIS — B351 Tinea unguium: Secondary | ICD-10-CM | POA: Diagnosis not present

## 2021-09-07 NOTE — Progress Notes (Signed)
  Subjective:  Patient ID: Edward Briggs, male    DOB: May 16, 1951,  MRN: [id number]  Edward Briggs presents to clinic today for preventative diabetic foot care and thick, elongated toenails b/l feet which are tender when wearing enclosed shoe gear.  Patient states blood glucose was 153 mg/dl today.   PCP is [name], MD , and last visit was 04/24/2021.  No Known Allergies  Review of Systems: Negative except as noted in the HPI. Objective:   Constitutional Edward Briggs is a pleasant 70 y.o. African American male, WD, WN in NAD. AAO x 3.   Vascular Capillary refill time to digits immediate b/l. Palpable DP pulse(s) b/l lower extremities Faintly palpable PT pulse(s) b/l lower extremities. Pedal hair sparse. Lower extremity skin temperature gradient within normal limits. No edema noted b/l lower extremities. No cyanosis or clubbing noted.  Neurologic Normal speech. Oriented to person, place, and time. Protective sensation intact 5/5 intact bilaterally with 10g monofilament b/l.  Dermatologic Pedal integument with normal turgor, texture and tone b/l LE. No open wounds b/l. No interdigital macerations b/l. Toenails 1-5 b/l elongated, thickened, discolored with subungual debris. +Tenderness with dorsal palpation of nailplates. No hyperkeratotic or porokeratotic lesions present. Toenails 1-5 b/l elongated, discolored, dystrophic, thickened, crumbly with subungual debris and tenderness to dorsal palpation.  Orthopedic: Normal muscle strength 5/5 to all lower extremity muscle groups bilaterally. HAV with bunion deformity noted b/l LE.   Radiographs: None Assessment:   1. Pain due to onychomycosis of toenails of both feet    Plan:  Patient was evaluated and treated and all questions answered. Consent given for treatment as described below: -No new findings. No new orders. -Continue diabetic foot care principles: inspect feet daily, monitor glucose as recommended by PCP and/or  Endocrinologist, and follow prescribed diet per PCP, Endocrinologist and/or dietician. -Patient to continue soft, supportive shoe gear daily. -Patient/POA to call should there be question/concern in the interim.  Return in about 3 months (around 12/05/2021).  Marzetta Board, DPM

## 2021-10-03 ENCOUNTER — Ambulatory Visit (INDEPENDENT_AMBULATORY_CARE_PROVIDER_SITE_OTHER): Payer: Medicare Other | Admitting: Family Medicine

## 2021-10-03 VITALS — BP 116/74 | HR 73 | Temp 98.1°F | Resp 16 | Ht 72.0 in | Wt 252.6 lb

## 2021-10-03 DIAGNOSIS — Z1211 Encounter for screening for malignant neoplasm of colon: Secondary | ICD-10-CM

## 2021-10-03 DIAGNOSIS — E1165 Type 2 diabetes mellitus with hyperglycemia: Secondary | ICD-10-CM | POA: Diagnosis not present

## 2021-10-03 DIAGNOSIS — Z9189 Other specified personal risk factors, not elsewhere classified: Secondary | ICD-10-CM

## 2021-10-03 DIAGNOSIS — E785 Hyperlipidemia, unspecified: Secondary | ICD-10-CM

## 2021-10-03 DIAGNOSIS — Z794 Long term (current) use of insulin: Secondary | ICD-10-CM

## 2021-10-03 DIAGNOSIS — E1169 Type 2 diabetes mellitus with other specified complication: Secondary | ICD-10-CM

## 2021-10-03 LAB — COMPREHENSIVE METABOLIC PANEL
ALT: 14 U/L (ref 0–53)
AST: 14 U/L (ref 0–37)
Albumin: 4 g/dL (ref 3.5–5.2)
Alkaline Phosphatase: 58 U/L (ref 39–117)
BUN: 12 mg/dL (ref 6–23)
CO2: 29 mEq/L (ref 19–32)
Calcium: 9.5 mg/dL (ref 8.4–10.5)
Chloride: 103 mEq/L (ref 96–112)
Creatinine, Ser: 0.91 mg/dL (ref 0.40–1.50)
GFR: 85.73 mL/min (ref >60.00)
Glucose, Bld: 165 mg/dL — ABNORMAL HIGH (ref 70–99)
Potassium: 4.4 mEq/L (ref 3.5–5.1)
Sodium: 137 mEq/L (ref 135–145)
Total Bilirubin: 0.7 mg/dL (ref 0.2–1.2)
Total Protein: 7.7 g/dL (ref 6.0–8.3)

## 2021-10-03 LAB — LIPID PANEL
Cholesterol: 125 mg/dL (ref 0–200)
HDL: 39.6 mg/dL (ref >39.00)
LDL Cholesterol: 68 mg/dL (ref 0–99)
NonHDL: 85.74
Total CHOL/HDL Ratio: 3
Triglycerides: 91 mg/dL (ref 0.0–149.0)
VLDL: 18.2 mg/dL (ref 0.0–40.0)

## 2021-10-03 LAB — HEMOGLOBIN A1C: Hgb A1c MFr Bld: 8.4 % — ABNORMAL HIGH (ref 4.6–6.5)

## 2021-10-03 MED ORDER — LANTUS SOLOSTAR 100 UNIT/ML ~~LOC~~ SOPN: 30.0000 [IU] | PEN_INJECTOR | Freq: Every day | SUBCUTANEOUS | 1 refills | Status: DC

## 2021-10-03 MED ORDER — ATORVASTATIN CALCIUM 10 MG PO TABS: 10.0000 mg | ORAL_TABLET | Freq: Every day | ORAL | 1 refills | Status: DC

## 2021-10-03 NOTE — Progress Notes (Signed)
Subjective:  Patient ID: Edward Briggs, male    DOB: Apr 02, 1951  Age: 70 y.o. MRN: [id number]  CC:  Chief Complaint  Patient presents with   Diabetes    Pt reports did start lantus 30 units reports feeling okay avg home BG 115-154   Hyperlipidemia    Pt due for refill on lipitor and is due for check on labs     HPI Edward Briggs presents for   Diabetes: With hyperglycemia, last visit in August.  Was still taking 28 units of Lantus although previous plan was increasing to 30 units.  Was still taking 150 mg twice daily. He is on ACE inhibitor, and has been started on Lipitor.  Insulin was increased to 30 units/day.  Recommended discussion of floaters with his ophthalmologist. Microalbumin: Normal ratio 03/28/2021. Optho, foot exam, pneumovax: Up-to-date Home readings:  Fasting: 114-157. No symptomatic lows.  Postprandial - none.  No new side effects with meds.  Lab Results  Component Value Date   HGBA1C 8.1 (A) 06/27/2021   HGBA1C 8.5 (A) 03/28/2021   HGBA1C 8.8 (H) 01/19/2020   Lab Results  Component Value Date   MICROALBUR <0.7 03/28/2021   Hissop 90 03/28/2021   CREATININE 0.88 06/27/2021   Hyperlipidemia: Started on Lipitor last visit given ASCVD risk score and underlying diabetes.  Option of intermittent dosing initially.  Currently taking daily.  No new myalgias or side effects.  Fasting today. Lab Results  Component Value Date   CHOL 147 03/28/2021   HDL 34.50 (L) 03/28/2021   LDLCALC 90 03/28/2021   TRIG 116.0 03/28/2021   CHOLHDL 4 03/28/2021   Lab Results  Component Value Date   ALT 12 06/27/2021   AST 14 06/27/2021   ALKPHOS 61 06/27/2021   BILITOT 0.5 06/27/2021   HM: Due for covid booster. Recommended Bivalent vaccine.  Recommended flu vaccine - declines.  Cologuard ordered in May. Has not received.     History Patient Active Problem List   Diagnosis Date Noted   HTN (hypertension) 62/83/1517   Acute metabolic encephalopathy  61/60/7371   AKI (acute kidney injury) ([place]) 07/22/2019   Sepsis ([place]) 07/22/2019   UTI (urinary tract infection) 07/22/2019   Diabetes mellitus without complication ([place]) 04/28/9484   CHEST PAIN 07/21/2009   Past Medical History:  Diagnosis Date   Diabetes mellitus without complication ([place])    GERD (gastroesophageal reflux disease)    no meds   Past Surgical History:  Procedure Laterality Date   ABSCESS DRAINAGE     FRACTURE SURGERY     HARDWARE REMOVAL  03/26/2012   Procedure: HARDWARE REMOVAL;  Surgeon: [name], MD;  Location: [facility];  Service: Orthopedics;  Laterality: Left;  hardware removal deep left ankle syndesmotic screws only and stress xrays ankle    ORIF ANKLE FRACTURE  12/03/2011   Procedure: OPEN REDUCTION INTERNAL FIXATION (ORIF) ANKLE FRACTURE;  Surgeon: [name], MD;  Location: [facility];  Service: Orthopedics;  Laterality: Left;  left medial malleolus fracture   PILONIDAL CYST EXCISION  ~1982   SKIN GRAFT  ~1970   Lt great toe (graft from Lt thigh)   No Known Allergies Prior to Admission medications   Medication Sig Start Date End Date Taking? Authorizing Provider  Ascorbic Acid (VITAMIN C PO) Take 1 tablet by mouth daily.    Yes [provider]  aspirin 81 MG tablet Take 81 mg by mouth daily.   Yes [provider]  atorvastatin (LIPITOR) 10 MG tablet Take 1 tablet (10 mg total) by mouth daily. Can start once per week and increase to daily if tolerated. 06/27/21  Yes [name], MD  blood glucose meter kit and supplies Dispense based on patient and insurance preference. Use up to four times daily as directed. (FOR ICD-10 E10.9, E11.9). 07/25/19  Yes [name], MD  Blood Glucose Monitoring Suppl (BLOOD GLUCOSE METER) kit Use as instructed 09/10/12  Yes [name], MD  glucose blood test strip Check blood sugar 3 times daily 05/29/21  Yes Camara, Amadou, MD  insulin glargine (LANTUS  SOLOSTAR) 100 UNIT/ML Solostar Pen Inject 30 Units into the skin daily. 06/27/21  Yes [name], MD  Insulin Pen Needle (BD PEN NEEDLE NANO U/F) 32G X 4 MM MISC USE DAILY AS DIRECTED 03/28/21  Yes [name], MD  lisinopril (ZESTRIL) 5 MG tablet Take 1 tablet (5 mg total) by mouth daily. 06/27/21  Yes [name], MD  metFORMIN (GLUCOPHAGE) 850 MG tablet Take 1 tablet (850 mg total) by mouth 2 (two) times daily with a meal. 06/27/21  Yes [name], MD  donepezil (ARICEPT) 5 MG tablet Take 1 tablet (5 mg total) by mouth at bedtime. 05/29/21 08/27/21  [name], MD   Social History   Socioeconomic History   Marital status: Married    Spouse name: Not on file   Number of children: 2   Years of education: Not on file   Highest education level: Not on file  Occupational History   Occupation: retired  Tobacco Use   Smoking status: Former    Types: Cigarettes    Quit date: 10/30/1988    Years since quitting: 32.9   Smokeless tobacco: Never  Vaping Use   Vaping Use: Never used  Substance and Sexual Activity   Alcohol use: Yes    Alcohol/week: 1.0 standard drink    Types: 1 Shots of liquor per week    Comment: every two weeks   Drug use: No   Sexual activity: Yes    Birth control/protection: None  Other Topics Concern   Not on file  Social History Narrative   Not on file   Social Determinants of Health   Financial Resource Strain: Not on file  Food Insecurity: Not on file  Transportation Needs: Not on file  Physical Activity: Not on file  Stress: Not on file  Social Connections: Not on file  Intimate Partner Violence: Not on file    Review of Systems  Constitutional:  Negative for fatigue and unexpected weight change.  Eyes:  Negative for visual disturbance.  Respiratory:  Negative for cough, chest tightness and shortness of breath.   Cardiovascular:  Negative for chest pain, palpitations and leg swelling.  Gastrointestinal:  Negative for  abdominal pain and blood in stool.  Neurological:  Negative for dizziness, light-headedness and headaches.    Objective:   Vitals:   10/03/21 1046  BP: 116/74  Pulse: 73  Resp: 16  Temp: 98.1 F (36.7 C)  TempSrc: Temporal  SpO2: 98%  Weight: 252 lb 9.6 oz (114.6 kg)  Height: 6' (1.829 m)     Physical Exam Vitals reviewed.  Constitutional:      Appearance: He is well-developed.  HENT:     Head: Normocephalic and atraumatic.  Neck:     Vascular: No carotid bruit or JVD.  Cardiovascular:     Rate and Rhythm: Normal rate and regular rhythm.  Heart sounds: Normal heart sounds. No murmur heard. Pulmonary:     Effort: Pulmonary effort is normal.     Breath sounds: Normal breath sounds. No rales.  Musculoskeletal:     Right lower leg: No edema.     Left lower leg: No edema.  Skin:    General: Skin is warm and dry.  Neurological:     Mental Status: He is alert and oriented to person, place, and time.  Psychiatric:        Mood and Affect: Mood normal.       Assessment & Plan:  ANGLE KAREL is a 70 y.o. male . Type 2 diabetes mellitus with hyperglycemia, with long-term current use of insulin (HCC) - Plan: insulin glargine (LANTUS SOLOSTAR) 100 UNIT/ML Solostar Pen, atorvastatin (LIPITOR) 10 MG tablet, Comprehensive metabolic panel, Hemoglobin A1c  -Tolerating current dose of Lantus, still some elevated readings.  Check A1c.  Continue same dose metformin.  At increased risk for cardiovascular disease - Plan: atorvastatin (LIPITOR) 10 MG tablet Hyperlipidemia associated with type 2 diabetes mellitus ([place]) - Plan: Comprehensive metabolic panel, Lipid panel  -Tolerating statin, continue same, updated labs ordered.  Screen for colon cancer - Plan: Cologuard  -Repeat order for Cologuard, discussed timing and limitations previously.  No questions.  Meds ordered this encounter  Medications   insulin glargine (LANTUS SOLOSTAR) 100 UNIT/ML Solostar Pen    Sig: Inject  30 Units into the skin daily.    Dispense:  36 mL    Refill:  1   atorvastatin (LIPITOR) 10 MG tablet    Sig: Take 1 tablet (10 mg total) by mouth daily. Can start once per week and increase to daily if tolerated.    Dispense:  90 tablet    Refill:  1   Patient Instructions  I do recommend new covid booster. [facility] is now offering the bivalent Covid booster vaccine. To schedule an appointment or to find more information, visit [email or web address] You can also call [phone], Monday through Friday, 7 a.m. to 7 p.m, or can check with your pharmacy.   No med changes for now.  I will check labs.  Follow-up in 3 months.  Take care.     Signed,   [name], MD [facility] 10/03/21 11:22 AM

## 2021-10-03 NOTE — Patient Instructions (Addendum)
I do recommend new covid booster. [facility] is now offering the bivalent Covid booster vaccine. To schedule an appointment or to find more information, visit [email or web address] You can also call [phone], Monday through Friday, 7 a.m. to 7 p.m, or can check with your pharmacy.   No med changes for now.  I will check labs.  Follow-up in 3 months.  Take care.

## 2021-10-29 ENCOUNTER — Other Ambulatory Visit: Payer: Self-pay | Admitting: Family Medicine

## 2021-10-29 NOTE — Progress Notes (Signed)
Letter mailed to patient address on file.

## 2021-11-06 LAB — COLOGUARD: COLOGUARD: POSITIVE — AB

## 2021-11-07 ENCOUNTER — Telehealth: Payer: Self-pay

## 2021-11-07 ENCOUNTER — Other Ambulatory Visit: Payer: Self-pay | Admitting: Family Medicine

## 2021-11-07 DIAGNOSIS — R195 Other fecal abnormalities: Secondary | ICD-10-CM

## 2021-11-07 NOTE — Telephone Encounter (Signed)
Cologaurd testing negative, pt asking next steps.    Referral to GI and formal colonoscopy to follow up and go from there?

## 2021-11-07 NOTE — Telephone Encounter (Signed)
Caller name:Elise Hodgens   On DPR? :yes  Call back number:[phone]  Provider they see: [name]   Reason for call:Pt is calling about the cologaurd results what is the next step that needs to be done regarding the positive results?

## 2021-11-07 NOTE — Progress Notes (Signed)
See lab notes, positive Cologuard colon cancer screening test, no new bowel symptoms.

## 2021-11-08 NOTE — Telephone Encounter (Signed)
See lab notes

## 2021-11-11 ENCOUNTER — Encounter: Payer: Self-pay | Admitting: Gastroenterology

## 2021-11-25 ENCOUNTER — Telehealth: Payer: Self-pay | Admitting: Family Medicine

## 2021-11-25 ENCOUNTER — Other Ambulatory Visit: Payer: Self-pay

## 2021-11-25 DIAGNOSIS — Z794 Long term (current) use of insulin: Secondary | ICD-10-CM

## 2021-11-25 DIAGNOSIS — E1165 Type 2 diabetes mellitus with hyperglycemia: Secondary | ICD-10-CM

## 2021-11-25 MED ORDER — GLUCOSE BLOOD VI STRP
ORAL_STRIP | 4 refills | Status: AC
Start: 2021-11-25 — End: ?

## 2021-11-25 MED ORDER — BLOOD GLUCOSE METER KIT: PACK | 0 refills | Status: DC

## 2021-11-25 MED ORDER — BLOOD GLUCOSE METER KIT: PACK | 0 refills | Status: AC

## 2021-11-25 NOTE — Telephone Encounter (Signed)
Kit and supplies were sent to the pharmacy

## 2021-11-25 NOTE — Telephone Encounter (Signed)
Pt called in stating that his meter broke, he needs a new script for a blood glucose meter and supplies sent to [facility].  Please advise

## 2021-12-10 ENCOUNTER — Other Ambulatory Visit: Payer: Self-pay

## 2021-12-10 ENCOUNTER — Ambulatory Visit (AMBULATORY_SURGERY_CENTER): Payer: Medicare Other | Admitting: *Deleted

## 2021-12-10 VITALS — Ht 73.0 in | Wt 252.0 lb

## 2021-12-10 DIAGNOSIS — Z1211 Encounter for screening for malignant neoplasm of colon: Secondary | ICD-10-CM

## 2021-12-10 MED ORDER — PEG 3350-KCL-NA BICARB-NACL 420 G PO SOLR
4000.0000 mL | Freq: Once | ORAL | 0 refills | Status: AC
Start: 2021-12-10 — End: 2021-12-10

## 2021-12-10 NOTE — Progress Notes (Signed)
No egg or soy allergy known to patient  No issues known to pt with past sedation with any surgeries or procedures Patient denies ever being told they had issues or difficulty with intubation  No FH of Malignant Hyperthermia Pt is not on diet pills Pt is not on  home 02  Pt is not on blood thinners  Pt denies issues with constipation  No A fib or A flutter  + cologuard  but screen per new guidelines   Pt is fully vaccinated  for Covid   NO PA's for preps discussed with pt In PV today  Discussed with pt there will be an out-of-pocket cost for prep and that varies from $0 to 70 +  dollars - pt verbalized understanding   Due to the COVID-19 pandemic we are asking patients to follow certain guidelines in PV and the [facility]   Pt aware of COVID protocols and [facility] guidelines   PV completed over the phone. Pt verified name, DOB, address and insurance during PV today.  Wife on phone with pt during [place] today  Pt emailed instruction packet to [email or web address] Pt encouraged to call with questions or issues.  If pt has My chart, procedure instructions sent via My Chart

## 2021-12-16 ENCOUNTER — Other Ambulatory Visit: Payer: Self-pay

## 2021-12-16 ENCOUNTER — Ambulatory Visit (INDEPENDENT_AMBULATORY_CARE_PROVIDER_SITE_OTHER): Payer: Medicare Other | Admitting: Podiatry

## 2021-12-16 DIAGNOSIS — B351 Tinea unguium: Secondary | ICD-10-CM | POA: Diagnosis not present

## 2021-12-16 DIAGNOSIS — L608 Other nail disorders: Secondary | ICD-10-CM

## 2021-12-16 DIAGNOSIS — E119 Type 2 diabetes mellitus without complications: Secondary | ICD-10-CM | POA: Diagnosis not present

## 2021-12-16 DIAGNOSIS — M79675 Pain in left toe(s): Secondary | ICD-10-CM

## 2021-12-16 DIAGNOSIS — M79674 Pain in right toe(s): Secondary | ICD-10-CM

## 2021-12-20 ENCOUNTER — Encounter: Payer: Self-pay | Admitting: Gastroenterology

## 2021-12-22 ENCOUNTER — Encounter: Payer: Self-pay | Admitting: Podiatry

## 2021-12-22 NOTE — Progress Notes (Signed)
Subjective: Edward Briggs is a 71 y.o. male patient seen today for follow up of  preventative diabetic foot care and painful elongated mycotic toenails 1-5 bilaterally which are tender when wearing enclosed shoe gear. Pain is relieved with periodic professional debridement..   Patient dd not check his blood glucose today.  New problem(s)/concern(s) today: None    PCP is [name], MD. Last visit was: 10/03/2021.  No Known Allergies  Objective: Physical Exam  General: Patient is a pleasant 71 y.o. African American male in NAD. AAO x 3.   Neurovascular Examination: Capillary refill time to digits immediate b/l. Palpable DP pulse(s) b/l LE. Faintly palpable PT pulse(s) b/l LE. Pedal hair sparse. No pain with calf compression b/l. Lower extremity skin temperature gradient within normal limits. No cyanosis or clubbing noted b/l LE.  Protective sensation intact 5/5 intact bilaterally with 10g monofilament b/l.  Dermatological:  Pedal integument with normal turgor, texture and tone BLE. No open wounds b/l lower extremities. No interdigital macerations noted b/l LE. Toenails 2-5 bilaterally well maintained with adequate length. No erythema, no edema, no drainage, no fluctuance. Pincer nail deformity bilateral great toes. No erythema, no edema, no drainage, no fluctuance. Nail border hypertrophy minimal.  Sign(s) of infection: no clinical signs of infection noted on examination today..  Musculoskeletal:  Normal muscle strength 5/5 to all lower extremity muscle groups bilaterally. HAV with bunion deformity noted b/l LE.[name] No pain, crepitus or joint limitation noted with ROM b/l LE.  Patient ambulates independently without assistive aids.  Assessment: 1. Pain due to onychomycosis of toenails of both feet   2. Pincer nail deformity   3. Diabetes mellitus without complication ([place])    Plan: Patient was evaluated and treated and all questions answered. Consent given for treatment as  described below: -Continue foot and shoe inspections daily. Monitor blood glucose per PCP/Endocrinologist's recommendations. -Toenails 2-5 bilaterally debrided in length and girth without iatrogenic bleeding with sterile nail nipper and dremel.  -Offending nail border debrided and curretaged bilateral great toes utilizing sterile nail nipper and currette. Border cleansed with alcohol and triple antibiotic applied. No further treatment required by patient/caregiver. -Patient/POA to call should there be question/concern in the interim.  Return in about 3 months (around 03/15/2022).  Marzetta Board, DPM

## 2021-12-24 ENCOUNTER — Other Ambulatory Visit: Payer: Self-pay

## 2021-12-24 ENCOUNTER — Encounter: Payer: Self-pay | Admitting: Gastroenterology

## 2021-12-24 ENCOUNTER — Ambulatory Visit (AMBULATORY_SURGERY_CENTER): Payer: Medicare Other | Admitting: Gastroenterology

## 2021-12-24 VITALS — BP 144/87 | HR 59 | Temp 96.8°F | Resp 12 | Ht 72.0 in | Wt 252.0 lb

## 2021-12-24 DIAGNOSIS — D123 Benign neoplasm of transverse colon: Secondary | ICD-10-CM

## 2021-12-24 DIAGNOSIS — K635 Polyp of colon: Secondary | ICD-10-CM | POA: Diagnosis not present

## 2021-12-24 DIAGNOSIS — Z1211 Encounter for screening for malignant neoplasm of colon: Secondary | ICD-10-CM | POA: Diagnosis not present

## 2021-12-24 DIAGNOSIS — D124 Benign neoplasm of descending colon: Secondary | ICD-10-CM

## 2021-12-24 DIAGNOSIS — R195 Other fecal abnormalities: Secondary | ICD-10-CM

## 2021-12-24 MED ORDER — SODIUM CHLORIDE 0.9 % IV SOLN: 500.0000 mL | Freq: Once | INTRAVENOUS | Status: DC

## 2021-12-24 NOTE — Op Note (Addendum)
[facility] Patient Name: Oma Alpert Procedure Date: 12/24/2021 8:30 AM MRN: [id number] Endoscopist: Milus Banister , MD Age: 71 Referring MD:  Date of Birth: [birth year] Gender: Male Account #: [id number] Procedure:                Colonoscopy Indications:              Positive Cologuard test Medicines:                Monitored Anesthesia Care Procedure:                Pre-Anesthesia Assessment:                           - Prior to the procedure, a History and Physical                            was performed, and patient medications and                            allergies were reviewed. The patient's tolerance of                            previous anesthesia was also reviewed. The risks                            and benefits of the procedure and the sedation                            options and risks were discussed with the patient.                            All questions were answered, and informed consent                            was obtained. Prior Anticoagulants: The patient has                            taken no previous anticoagulant or antiplatelet                            agents. ASA Grade Assessment: II - A patient with                            mild systemic disease. After reviewing the risks                            and benefits, the patient was deemed in                            satisfactory condition to undergo the procedure.                           After obtaining informed consent, the colonoscope  was passed under direct vision. Throughout the                            procedure, the patient's blood pressure, pulse, and                            oxygen saturations were monitored continuously. The                            Olympus CF-HQ190L [id number]) Colonoscope was                            introduced through the anus and advanced to the the                            cecum, identified by appendiceal orifice  and                            ileocecal valve. The colonoscopy was performed                            without difficulty. The patient tolerated the                            procedure well. The quality of the bowel                            preparation was good. The ileocecal valve,                            appendiceal orifice, and rectum were photographed. Scope In: 8:56:29 AM Scope Out: 9:11:45 AM Scope Withdrawal Time: 0 hours 11 minutes 1 second  Total Procedure Duration: 0 hours 15 minutes 16 seconds  Findings:                 Two sessile polyps were found in the descending                            colon and transverse colon. The polyps were 3 to 5                            mm in size. These polyps were removed with a cold                            snare. Resection and retrieval were complete.                           Multiple small and large-mouthed diverticula were                            found in the entire colon.                           External and internal hemorrhoids were found. The  hemorrhoids were small.                           The exam was otherwise without abnormality on                            direct and retroflexion views. Complications:            No immediate complications. Estimated blood loss:                            None. Estimated Blood Loss:     Estimated blood loss: none. Impression:               - Two 3 to 5 mm polyps in the descending colon and                            in the transverse colon, removed with a cold snare.                            Resected and retrieved.                           - Diverticulosis in the entire examined colon.                           - External and internal hemorrhoids.                           - The examination was otherwise normal on direct                            and retroflexion views. Recommendation:           - Patient has a contact number available for                             emergencies. The signs and symptoms of potential                            delayed complications were discussed with the                            patient. Return to normal activities tomorrow.                            Written discharge instructions were provided to the                            patient.                           - Resume previous diet.                           - Continue present medications.                           -  Await pathology results. Milus Banister, MD 12/24/2021 9:14:17 AM This report has been signed electronically.

## 2021-12-24 NOTE — Progress Notes (Signed)
Called to room to assist during endoscopic procedure.  Patient ID and intended procedure confirmed with present staff. Received instructions for my participation in the procedure from the performing physician.  

## 2021-12-24 NOTE — Patient Instructions (Signed)

## 2021-12-24 NOTE — Progress Notes (Signed)
PT taken to PACU. Monitors in place. VSS. Report given to RN. 

## 2021-12-24 NOTE — Progress Notes (Signed)
HPI: This is a man with cologuard + stool   ROS: complete GI ROS as described in HPI, all other review negative.  Constitutional:  No unintentional weight loss   Past Medical History:  Diagnosis Date   Diabetes mellitus without complication ([place])    GERD (gastroesophageal reflux disease)    no meds   Hypertension    controlled on meds   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    Past Surgical History:  Procedure Laterality Date   ABSCESS DRAINAGE     COLONOSCOPY     ~ 10 yrs ago   [place] REMOVAL  03/26/2012   Procedure: HARDWARE REMOVAL;  Surgeon: [name], MD;  Location: [place];  Service: Orthopedics;  Laterality: Left;  hardware removal deep left ankle syndesmotic screws only and stress xrays ankle    ORIF ANKLE FRACTURE  12/03/2011   Procedure: OPEN REDUCTION INTERNAL FIXATION (ORIF) ANKLE FRACTURE;  Surgeon: [name], MD;  Location: [place];  Service: Orthopedics;  Laterality: Left;  left medial malleolus fracture   PILONIDAL CYST EXCISION  ~1982   SKIN GRAFT  ~1970   Lt great toe (graft from Lt thigh)    Current Outpatient Medications  Medication Sig Dispense Refill   Accu-Chek Softclix Lancets lancets SMARTSIG:Topical 1-4 Times Daily     Ascorbic Acid (VITAMIN C PO) Take 1 tablet by mouth daily.      aspirin 81 MG tablet Take 81 mg by mouth daily.     atorvastatin (LIPITOR) 10 MG tablet Take 1 tablet (10 mg total) by mouth daily. Can start once per week and increase to daily if tolerated. 90 tablet 1   blood glucose meter kit and supplies Dispense based on patient and insurance preference. Use up to four times daily as directed. (FOR ICD-10 E10.9, E11.9). 1 each 0   Blood Glucose Monitoring Suppl (BLOOD GLUCOSE METER) kit Use as instructed 1 each 0   donepezil (ARICEPT) 5 MG tablet Take 1 tablet (5 mg total) by mouth at bedtime. 90 tablet 4   glucose blood test strip Check blood sugar 3 times daily 100 each 4    insulin glargine (LANTUS SOLOSTAR) 100 UNIT/ML Solostar Pen Inject 30 Units into the skin daily. (Patient taking differently: Inject 29 Units into the skin daily.) 36 mL 1   Insulin Pen Needle (BD PEN NEEDLE NANO U/F) 32G X 4 MM MISC USE DAILY AS DIRECTED 100 each 2   lisinopril (ZESTRIL) 5 MG tablet Take 1 tablet (5 mg total) by mouth daily. 90 tablet 1   metFORMIN (GLUCOPHAGE) 850 MG tablet Take 1 tablet (850 mg total) by mouth 2 (two) times daily with a meal. 180 tablet 1   polyethylene glycol-electrolytes (NULYTELY) 420 g solution Take 4,000 mLs by mouth once.     Current Facility-Administered Medications  Medication Dose Route Frequency Provider Last Rate Last Admin   0.9 %  sodium chloride infusion  500 mL Intravenous Once Milus Banister, MD        Allergies as of 12/24/2021   (No Known Allergies)    Family History  Problem Relation Age of Onset   Cancer Mother    Cancer Father    Colon cancer Neg Hx    Colon polyps Neg Hx    Esophageal cancer Neg Hx    Rectal cancer Neg Hx    Stomach cancer Neg Hx     Social History  Socioeconomic History   Marital status: Married    Spouse name: Not on file   Number of children: 2   Years of education: Not on file   Highest education level: Not on file  Occupational History   Occupation: retired  Tobacco Use   Smoking status: Former    Types: Cigarettes    Quit date: 10/30/1988    Years since quitting: 33.1   Smokeless tobacco: Never  Vaping Use   Vaping Use: Never used  Substance and Sexual Activity   Alcohol use: Yes    Alcohol/week: 1.0 standard drink    Types: 1 Shots of liquor per week    Comment: every two weeks   Drug use: No   Sexual activity: Yes    Birth control/protection: None  Other Topics Concern   Not on file  Social History Narrative   Not on file   Social Determinants of Health   Financial Resource Strain: Not on file  Food Insecurity: Not on file  Transportation Needs: Not on file  Physical  Activity: Not on file  Stress: Not on file  Social Connections: Not on file  Intimate Partner Violence: Not on file     Physical Exam: BP 131/87    Pulse 85    Temp (!) 96.8 F (36 C) (Temporal)    Ht 6' (1.829 m)    Wt 252 lb (114.3 kg)    SpO2 99%    BMI 34.18 kg/m  Constitutional: generally well-appearing Psychiatric: alert and oriented x3 Lungs: CTA bilaterally Heart: no MCR  Assessment and plan: 71 y.o. male with cologuard + stool.  For colonoscoyp today  Care is appropriate for the ambulatory setting.  Owens Loffler, MD [facility] 12/24/2021, 8:29 AM   ote

## 2021-12-24 NOTE — Progress Notes (Signed)
VS-AG  Pt's states no medical or surgical changes since previsit or office visit.  Pt reports that he didn't really pay attention to his last BM, but states that this morning he noted brown liquid stool with some small formed stool. Reports he noted that when he wiped the toilet tissue had brown on it. Dr. Ardis Hughs made aware. Will proceed with colonoscopy as planned.

## 2021-12-26 ENCOUNTER — Telehealth: Payer: Self-pay | Admitting: *Deleted

## 2021-12-26 NOTE — Telephone Encounter (Signed)
°  Follow up Call-  Call back number 12/24/2021  Post procedure Call Back phone  # [phone] wife's number  Permission to leave phone message Yes  Some recent data might be hidden     Patient questions:  Do you have a fever, pain , or abdominal swelling? No. Pain Score  0 *  Have you tolerated food without any problems? Yes.    Have you been able to return to your normal activities? Yes.    Do you have any questions about your discharge instructions: Diet   No. Medications  No. Follow up visit  No.  Do you have questions or concerns about your Care? No.  Actions: * If pain score is 4 or above: No action needed, pain <4.

## 2022-01-01 ENCOUNTER — Encounter: Payer: Self-pay | Admitting: Gastroenterology

## 2022-01-02 ENCOUNTER — Ambulatory Visit (INDEPENDENT_AMBULATORY_CARE_PROVIDER_SITE_OTHER): Payer: Medicare Other | Admitting: Family Medicine

## 2022-01-02 ENCOUNTER — Encounter: Payer: Self-pay | Admitting: Family Medicine

## 2022-01-02 VITALS — BP 130/80 | HR 76 | Temp 97.8°F | Resp 16 | Wt 255.0 lb

## 2022-01-02 DIAGNOSIS — Z794 Long term (current) use of insulin: Secondary | ICD-10-CM

## 2022-01-02 DIAGNOSIS — E1165 Type 2 diabetes mellitus with hyperglycemia: Secondary | ICD-10-CM

## 2022-01-02 DIAGNOSIS — Z9189 Other specified personal risk factors, not elsewhere classified: Secondary | ICD-10-CM

## 2022-01-02 DIAGNOSIS — I10 Essential (primary) hypertension: Secondary | ICD-10-CM

## 2022-01-02 LAB — POCT GLYCOSYLATED HEMOGLOBIN (HGB A1C): Hemoglobin A1C: 8.3 % — AB (ref 4.0–5.6)

## 2022-01-02 MED ORDER — METFORMIN HCL 850 MG PO TABS: 850.0000 mg | ORAL_TABLET | Freq: Two times a day (BID) | ORAL | 1 refills | Status: DC

## 2022-01-02 MED ORDER — LANTUS SOLOSTAR 100 UNIT/ML ~~LOC~~ SOPN: 31.0000 [IU] | PEN_INJECTOR | Freq: Every day | SUBCUTANEOUS | 1 refills | Status: DC

## 2022-01-02 MED ORDER — ATORVASTATIN CALCIUM 10 MG PO TABS: 10.0000 mg | ORAL_TABLET | Freq: Every day | ORAL | 1 refills | Status: DC

## 2022-01-02 MED ORDER — LISINOPRIL 5 MG PO TABS: 5.0000 mg | ORAL_TABLET | Freq: Every day | ORAL | 1 refills | Status: DC

## 2022-01-02 NOTE — Patient Instructions (Addendum)
I recommend covid booster at your pharmacy. Keep a record of your blood pressures outside of the office and send or call those readings in next 2 weeks to decide on higher dose lisinopril.  ? ?Increase lantus to 31 units for now. Watch for low blood sugars. Let me know if that occurs.  ?Recheck in 6 weeks.  ? ?

## 2022-01-02 NOTE — Progress Notes (Signed)
Subjective:  Patient ID: Edward Briggs, male    DOB: 11/24/1950  Age: 71 y.o. MRN: [id number]  CC:  Chief Complaint  Patient presents with   Diabetes    A1c  8.4 from 10/03/2021, is checking sugar level at home, 124-140 fasting, 160-180 2hr after eating, is not experiencing any sxs and has no concerns. Needs refill on insulin    Hypertension    Due for recheck, sometimes checks BP at home can not state what the ranges are    HPI Edward Briggs presents for   Diabetes: Complicated by hyperglycemia, on insulin.  Elevated A1c in December on 30 units Lantus.  On metformin 850 mg twice daily.  Based on his elevated A1c and home readings asked that he return in a few weeks with home readings to decide on medication changes.  Last visit in December. Taking Lantus insulin 29 units per day - unsure why he is not at 30 units. No new side effects of meds - metformin BID. No regular missed doses - about once per week. No missed doses of insulin.   Home readings 124-140 fasting, 160-180 postprandial. He is on ACE inhibitor as well as statin. Daily lipitor doing well.  Microalbumin: nl ratio 03/2021 Optho, foot exam, pneumovax: up to date.  Recommend bivalent covid booster.  2 meals per day, snacks. No missed meals.   Lab Results  Component Value Date   HGBA1C 8.3 (A) 01/02/2022   HGBA1C 8.4 (H) 10/03/2021   HGBA1C 8.1 (A) 06/27/2021   Lab Results  Component Value Date   MICROALBUR <0.7 03/28/2021   LDLCALC 68 10/03/2021   CREATININE 0.91 10/03/2021   Hypertension: Lisinopril 5 mg daily. Home readings: few elevated readings at times.unknown reading.  BP Readings from Last 3 Encounters:  01/02/22 130/80  12/24/21 (!) 144/87  10/03/21 116/74   Lab Results  Component Value Date   CREATININE 0.91 10/03/2021      History Patient Active Problem List   Diagnosis Date Noted   HTN (hypertension) 09/47/0962   Acute metabolic encephalopathy 83/66/2947   AKI (acute kidney injury)  ([place]) 07/22/2019   Sepsis ([place]) 07/22/2019   UTI (urinary tract infection) 07/22/2019   Diabetes mellitus without complication ([place]) 65/46/5035   CHEST PAIN 07/21/2009   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   Past Surgical History:  Procedure Laterality Date   ABSCESS DRAINAGE     COLONOSCOPY     ~ 10 yrs ago   [place]  03/26/2012   Procedure: HARDWARE REMOVAL;  Surgeon: [name], MD;  Location: [place];  Service: Orthopedics;  Laterality: Left;  hardware removal deep left ankle syndesmotic screws only and stress xrays ankle    ORIF ANKLE FRACTURE  12/03/2011   Procedure: OPEN REDUCTION INTERNAL FIXATION (ORIF) ANKLE FRACTURE;  Surgeon: [name], MD;  Location: [place];  Service: Orthopedics;  Laterality: Left;  left medial malleolus fracture   PILONIDAL CYST EXCISION  ~1982   SKIN GRAFT  ~1970   Lt great toe (graft from Lt thigh)   No Known Allergies Prior to Admission medications   Medication Sig Start Date End Date Taking? Authorizing Provider  Accu-Chek Softclix Lancets lancets SMARTSIG:Topical 1-4 Times  Daily 11/25/21  Yes [provider]  Ascorbic Acid (VITAMIN C PO) Take 1 tablet by mouth daily.    Yes [provider]  aspirin 81 MG tablet Take 81 mg by mouth daily.   Yes [provider]  atorvastatin (LIPITOR) 10 MG tablet Take 1 tablet (10 mg total) by mouth daily. Can start once per week and increase to daily if tolerated. 10/03/21  Yes [name], MD  blood glucose meter kit and supplies Dispense based on patient and insurance preference. Use up to four times daily as directed. (FOR ICD-10 E10.9, E11.9). 11/25/21  Yes [name], MD  Blood Glucose Monitoring Suppl (BLOOD GLUCOSE METER) kit  Use as instructed 09/10/12  Yes [name], MD  Insulin Pen Needle (BD PEN NEEDLE NANO U/F) 32G X 4 MM MISC USE DAILY AS DIRECTED 03/28/21  Yes [name], MD  lisinopril (ZESTRIL) 5 MG tablet Take 1 tablet (5 mg total) by mouth daily. 06/27/21  Yes [name], MD  metFORMIN (GLUCOPHAGE) 850 MG tablet Take 1 tablet (850 mg total) by mouth 2 (two) times daily with a meal. 06/27/21  Yes [name], MD  donepezil (ARICEPT) 5 MG tablet Take 1 tablet (5 mg total) by mouth at bedtime. 05/29/21 12/24/21  [name], MD  glucose blood test strip Check blood sugar 3 times daily 11/25/21   [name], MD  insulin glargine (LANTUS SOLOSTAR) 100 UNIT/ML Solostar Pen Inject 30 Units into the skin daily. Patient taking differently: Inject 29 Units into the skin daily. 10/03/21   [name], MD   Social History   Socioeconomic History   Marital status: Married    Spouse name: Not on file   Number of children: 2   Years of education: Not on file   Highest education level: Not on file  Occupational History   Occupation: retired  Tobacco Use   Smoking status: Former    Types: Cigarettes    Quit date: 10/30/1988    Years since quitting: 33.1   Smokeless tobacco: Never  Vaping Use   Vaping Use: Never used  Substance and Sexual Activity   Alcohol use: Yes    Alcohol/week: 1.0 standard drink    Types: 1 Shots of liquor per week    Comment: every two weeks   Drug use: No   Sexual activity: Yes    Birth control/protection: None  Other Topics Concern   Not on file  Social History Narrative   Not on file   Social Determinants of Health   Financial Resource Strain: Not on file  Food Insecurity: Not on file  Transportation Needs: Not on file  Physical Activity: Not on file  Stress: Not on file  Social Connections: Not on file  Intimate Partner Violence: Not on file    Review of Systems  Constitutional:  Negative for fatigue and unexpected weight change.   Eyes:  Negative for visual disturbance.  Respiratory:  Negative for cough, chest tightness and shortness of breath.   Cardiovascular:  Negative for chest pain, palpitations and leg swelling.  Gastrointestinal:  Negative for abdominal pain and blood in stool.  Neurological:  Negative for dizziness, light-headedness and headaches.    Objective:   Vitals:   01/02/22 1047  BP: 130/80  Pulse: 76  Resp: 16  Temp: 97.8 F (36.6 C)  TempSrc: Temporal  SpO2: 98%  Weight: 255 lb (115.7 kg)     Physical Exam Vitals reviewed.  Constitutional:      Appearance: He is well-developed.  HENT:     Head: Normocephalic and atraumatic.  Neck:     Vascular: No carotid bruit or JVD.  Cardiovascular:     Rate and Rhythm: Normal rate and regular rhythm.     Heart sounds: Normal heart sounds. No murmur heard. Pulmonary:     Effort: Pulmonary effort is normal.     Breath sounds: Normal breath sounds. No rales.  Abdominal:     Tenderness: There is no abdominal tenderness.  Musculoskeletal:     Right lower leg: No edema.     Left lower leg: No edema.  Skin:    General: Skin is warm and dry.  Neurological:     Mental Status: He is alert and oriented to person, place, and time.  Psychiatric:        Mood and Affect: Mood normal.   Results for orders placed or performed in visit on 01/02/22  POCT glycosylated hemoglobin (Hb A1C)  Result Value Ref Range   Hemoglobin A1C 8.3 (A) 4.0 - 5.6 %   HbA1c POC (<> result, manual entry)     HbA1c, POC (prediabetic range)     HbA1c, POC (controlled diabetic range)       Assessment & Plan:  Edward Briggs is a 71 y.o. male . Type 2 diabetes mellitus with hyperglycemia, with long-term current use of insulin (HCC) - Plan: metFORMIN (GLUCOPHAGE) 850 MG tablet, atorvastatin (LIPITOR) 10 MG tablet, POCT glycosylated hemoglobin (Hb A1C)  -Uncontrolled, will increase Lantus by 2 units for total of 31 units for now, continue metformin same dose.  Likely  will need increased doses but will go slowly to minimize risk for hypoglycemia at his age.  RTC precautions given if hypoglycemia occurs, recheck 6 weeks with home readings to decide on further changes.  Essential hypertension - Plan: lisinopril (ZESTRIL) 5 MG tablet  -Borderline control, check home readings and advise me in the next 2 weeks to decide on possible increased dose.  Can also be rechecked at his 6-week follow-up.  At increased risk for cardiovascular disease - Plan: atorvastatin (LIPITOR) 10 MG tablet Tolerating Lipitor, continue same dose.   Meds ordered this encounter  Medications   lisinopril (ZESTRIL) 5 MG tablet    Sig: Take 1 tablet (5 mg total) by mouth daily.    Dispense:  90 tablet    Refill:  1   metFORMIN (GLUCOPHAGE) 850 MG tablet    Sig: Take 1 tablet (850 mg total) by mouth 2 (two) times daily with a meal.    Dispense:  180 tablet    Refill:  1   atorvastatin (LIPITOR) 10 MG tablet    Sig: Take 1 tablet (10 mg total) by mouth daily.    Dispense:  90 tablet    Refill:  1   Patient Instructions  I recommend covid booster at your pharmacy. Keep a record of your blood pressures outside of the office and send or call those readings in next 2 weeks to decide on higher dose lisinopril.   Increase lantus to 31 units for now. Watch for low blood sugars. Let me know if that occurs.  Recheck in 6 weeks.      Signed,   [name], MD [place] 01/02/22 12:16 PM

## 2022-01-22 ENCOUNTER — Other Ambulatory Visit: Payer: Self-pay | Admitting: Family Medicine

## 2022-02-13 ENCOUNTER — Telehealth: Payer: Self-pay | Admitting: Family Medicine

## 2022-02-13 ENCOUNTER — Encounter: Payer: Self-pay | Admitting: Family Medicine

## 2022-02-13 ENCOUNTER — Ambulatory Visit (INDEPENDENT_AMBULATORY_CARE_PROVIDER_SITE_OTHER): Payer: Medicare Other | Admitting: Family Medicine

## 2022-02-13 VITALS — BP 138/70 | HR 80 | Temp 98.1°F | Resp 17 | Ht 72.0 in | Wt 254.4 lb

## 2022-02-13 DIAGNOSIS — I10 Essential (primary) hypertension: Secondary | ICD-10-CM | POA: Diagnosis not present

## 2022-02-13 DIAGNOSIS — E1165 Type 2 diabetes mellitus with hyperglycemia: Secondary | ICD-10-CM | POA: Diagnosis not present

## 2022-02-13 DIAGNOSIS — Z794 Long term (current) use of insulin: Secondary | ICD-10-CM

## 2022-02-13 LAB — GLUCOSE, POCT (MANUAL RESULT ENTRY): POC Glucose: 243 mg/dl — AB (ref 70–99)

## 2022-02-13 NOTE — Telephone Encounter (Signed)
Pt stated when he was seen today, Dr.Greene advised him to have a certain brand of candy on him at all times. Please advise pt what the name of this candy was.   ?

## 2022-02-13 NOTE — Patient Instructions (Addendum)
As blood sugar control improves, watch for low sugars - let me know if that occurs.  ?Increase the lantus to 33 units per day for now. Recheck in 6 weeks with blood work.  ? ?Keep a record of your blood pressures outside of the office and if over 140 on top number or over 90 on bottom number let me know.  ? ? ?

## 2022-02-13 NOTE — Progress Notes (Signed)
? ?Subjective:  ?Patient ID: Edward Briggs, male    DOB: 1951/07/26  Age: 71 y.o. MRN: [id number] ? ?CC:  ?Chief Complaint  ?Patient presents with  ? Diabetes  ?  Increased lantus last OV 2 units, pt has been checking BG at home have been slightly better, 119-150 prior to meal, 2 hours after meal 150-170, pt reports 155 BG about 1 hour ago and in office is currently 243  ? ? ?HPI ?[name] presents for  ? ?Diabetes: ?Follow-up from March 2 visit. ?Complicated by hyperglycemia, on insulin.  Treated with metformin 850 mg twice daily.  He was only taking 29 units Lantus last visit, increased Lantus to 31 units/day. ?Home readings: fasting 115-155. Postprandial in the 200's.  ?No symptomatic lows - lowest 115.  ? ?Lab Results  ?Component Value Date  ? HGBA1C 8.3 (A) 01/02/2022  ? HGBA1C 8.4 (H) 10/03/2021  ? HGBA1C 8.1 (A) 06/27/2021  ? ?Lab Results  ?Component Value Date  ? MICROALBUR <0.7 03/28/2021  ? [place] 68 10/03/2021  ? CREATININE 0.91 10/03/2021  ? ? ?Hypertension: ?Borderline control, remained on same dose lisinopril last visit. ?Home readings: 135/70? Rare readings.  ?BP Readings from Last 3 Encounters:  ?02/13/22 138/70  ?01/02/22 130/80  ?12/24/21 (!) 144/87  ? ?Lab Results  ?Component Value Date  ? CREATININE 0.91 10/03/2021  ? ? ?History ?Patient Active Problem List  ? Diagnosis Date Noted  ? HTN (hypertension) 07/22/2019  ? Acute metabolic encephalopathy 42/59/5638  ? AKI (acute kidney injury) ([place]) 07/22/2019  ? Sepsis ([place]) 07/22/2019  ? UTI (urinary tract infection) 07/22/2019  ? Diabetes mellitus without complication ([name]) 75/64/3329  ? CHEST PAIN 07/21/2009  ? ?Past Medical History:  ?Diagnosis Date  ? Diabetes mellitus without complication ([name])   ? GERD (gastroesophageal reflux disease)   ? no meds  ? Hypertension   ? controlled on meds  ? Memory loss   ? on Aricept  ? ?Past Surgical History:  ?Procedure Laterality Date  ? ABSCESS DRAINAGE    ? COLONOSCOPY    ? ~ 10 yrs ago  ? FRACTURE  SURGERY    ? HARDWARE REMOVAL  03/26/2012  ? Procedure: HARDWARE REMOVAL;  Surgeon: [name], MD;  Location: [name];  Service: Orthopedics;  Laterality: Left;  hardware removal deep left ankle syndesmotic screws only and stress xrays ankle ?  ? ORIF ANKLE FRACTURE  12/03/2011  ? Procedure: OPEN REDUCTION INTERNAL FIXATION (ORIF) ANKLE FRACTURE;  Surgeon: [name], MD;  Location: [name];  Service: Orthopedics;  Laterality: Left;  left medial malleolus fracture  ? PILONIDAL CYST EXCISION  ~1982  ? SKIN GRAFT  ~1970  ? Lt great toe (graft from Lt thigh)  ? ?No Known Allergies ?Prior to Admission medications   ?Medication Sig Start Date End Date Taking? Authorizing Provider  ?Accu-Chek Softclix Lancets lancets USE AS DIRECTED UP TO 4 TIMES DAILY 01/22/22  Yes [name], MD  ?Ascorbic Acid (VITAMIN C PO) Take 1 tablet by mouth daily.    Yes [provider]  ?aspirin 81 MG tablet Take 81 mg by mouth daily.   Yes [provider]  ?atorvastatin (LIPITOR) 10 MG tablet Take 1 tablet (10 mg total) by mouth daily. 01/02/22  Yes [name], MD  ?blood glucose meter kit and supplies Dispense based on patient and insurance preference. Use up to four times daily as directed. (FOR ICD-10 E10.9, E11.9). 11/25/21  Yes [name],  [name], MD  ?Blood Glucose Monitoring Suppl (BLOOD GLUCOSE METER) kit Use as instructed 09/10/12  Yes [name], MD  ?glucose blood test strip Check blood sugar 3 times daily 11/25/21  Yes [name], MD  ?insulin glargine (LANTUS SOLOSTAR) 100 UNIT/ML Solostar Pen Inject 31 Units into the skin daily. 01/02/22  Yes [name], MD  ?Insulin Pen Needle (BD PEN NEEDLE NANO U/F) 32G X 4 MM MISC USE DAILY AS DIRECTED 03/28/21  Yes [name], MD  ?lisinopril (ZESTRIL) 5 MG tablet Take 1 tablet (5 mg total) by mouth daily. 01/02/22  Yes [name], MD  ?metFORMIN (GLUCOPHAGE) 850 MG tablet Take 1 tablet (850 mg  total) by mouth 2 (two) times daily with a meal. 01/02/22  Yes [name], MD  ?donepezil (ARICEPT) 5 MG tablet Take 1 tablet (5 mg total) by mouth at bedtime. 05/29/21 12/24/21  [name], MD  ? ?Social History  ? ?Socioeconomic History  ? Marital status: Married  ?  Spouse name: Not on file  ? Number of children: 2  ? Years of education: Not on file  ? Highest education level: Not on file  ?Occupational History  ? Occupation: retired  ?Tobacco Use  ? Smoking status: Former  ?  Types: Cigarettes  ?  Quit date: 10/30/1988  ?  Years since quitting: 33.3  ? Smokeless tobacco: Never  ?Vaping Use  ? Vaping Use: Never used  ?Substance and Sexual Activity  ? Alcohol use: Yes  ?  Alcohol/week: 1.0 standard drink  ?  Types: 1 Shots of liquor per week  ?  Comment: every two weeks  ? Drug use: No  ? Sexual activity: Yes  ?  Birth control/protection: None  ?Other Topics Concern  ? Not on file  ?Social History Narrative  ? Not on file  ? ?Social Determinants of Health  ? ?Financial Resource Strain: Not on file  ?Food Insecurity: Not on file  ?Transportation Needs: Not on file  ?Physical Activity: Not on file  ?Stress: Not on file  ?Social Connections: Not on file  ?Intimate Partner Violence: Not on file  ? ? ?Review of Systems  ?Constitutional:  Negative for fatigue and unexpected weight change.  ?Eyes:  Negative for visual disturbance.  ?Respiratory:  Negative for cough, chest tightness and shortness of breath.   ?Cardiovascular:  Negative for chest pain, palpitations and leg swelling.  ?Gastrointestinal:  Negative for abdominal pain and blood in stool.  ?Neurological:  Negative for dizziness, light-headedness and headaches.  ? ? ?Objective:  ? ?Vitals:  ? 02/13/22 1051  ?BP: 138/70  ?Pulse: 80  ?Resp: 17  ?Temp: 98.1 ?F (36.7 ?C)  ?TempSrc: Temporal  ?SpO2: 97%  ?Weight: 254 lb 6.4 oz (115.4 kg)  ?Height: 6' (1.829 m)  ? ? ? ?Physical Exam ?Vitals reviewed.  ?Constitutional:   ?   Appearance: He is well-developed.   ?HENT:  ?   Head: Normocephalic and atraumatic.  ?Neck:  ?   Vascular: No carotid bruit or JVD.  ?Cardiovascular:  ?   Rate and Rhythm: Normal rate and regular rhythm.  ?   Heart sounds: Normal heart sounds. No murmur heard. ?Pulmonary:  ?   Effort: Pulmonary effort is normal.  ?   Breath sounds: Normal breath sounds. No rales.  ?Musculoskeletal:  ?   Right lower leg: No edema.  ?   Left lower leg: No edema.  ?Skin: ?   General: Skin is warm and  dry.  ?Neurological:  ?   Mental Status: He is alert and oriented to person, place, and time.  ?Psychiatric:     ?   Mood and Affect: Mood normal.  ? ?Results for orders placed or performed in visit on 02/13/22  ?POCT glucose (manual entry)  ?Result Value Ref Range  ? POC Glucose 243 (A) 70 - 99 mg/dl  ? ? ?Assessment & Plan:  ?NATALE THOMA is a 71 y.o. male . ?Type 2 diabetes mellitus with hyperglycemia, with long-term current use of insulin (HCC) - Plan: POCT glucose (manual entry) ? -Still decreased control, will adjust insulin by 2 additional units with hypoglycemia precautions.  Action plan if he does have hypoglycemia discussed.  Recheck 6 weeks with A1c ? ?Essential hypertension ?Borderline control.  Continue same regimen for now, home monitoring with RTC precautions.  Recheck 6 weeks ? ?No orders of the defined types were placed in this encounter. ? ?Patient Instructions  ?As blood sugar control improves, watch for low sugars - let me know if that occurs.  ?Increase the lantus to 33 units per day for now. Recheck in 6 weeks with blood work.  ? ?Keep a record of your blood pressures outside of the office and if over 140 on top number or over 90 on bottom number let me know.  ? ? ? ? ? ?Signed,  ? ?[name], MD ?[place], [facility] ?[facility] ?02/13/22 ?11:34 AM ? ? ?

## 2022-02-13 NOTE — Telephone Encounter (Signed)
Air heads candies, or glucose tablets for any low BG ?

## 2022-02-13 NOTE — Telephone Encounter (Signed)
Called lm stating Per Dr [name] verbal recommendation he should keep air heads candies glucose tablets or other candy on hand for when he has a low blood sugar  ?

## 2022-03-17 ENCOUNTER — Ambulatory Visit (INDEPENDENT_AMBULATORY_CARE_PROVIDER_SITE_OTHER): Payer: Medicare Other | Admitting: Podiatry

## 2022-03-17 ENCOUNTER — Encounter: Payer: Self-pay | Admitting: Podiatry

## 2022-03-17 DIAGNOSIS — M79675 Pain in left toe(s): Secondary | ICD-10-CM

## 2022-03-17 DIAGNOSIS — M79674 Pain in right toe(s): Secondary | ICD-10-CM | POA: Diagnosis not present

## 2022-03-17 DIAGNOSIS — B351 Tinea unguium: Secondary | ICD-10-CM

## 2022-03-19 ENCOUNTER — Ambulatory Visit (INDEPENDENT_AMBULATORY_CARE_PROVIDER_SITE_OTHER): Payer: Medicare Other

## 2022-03-19 ENCOUNTER — Ambulatory Visit: Payer: Medicare Other

## 2022-03-19 DIAGNOSIS — Z0000 Encounter for general adult medical examination without abnormal findings: Secondary | ICD-10-CM | POA: Diagnosis not present

## 2022-03-19 NOTE — Progress Notes (Signed)
? ?Subjective:  ? Edward Briggs is a 71 y.o. male who presents for an Initial Medicare Annual Wellness Visit. ? ? ?I connected with Edward Briggs today by telephone and verified that I am speaking with the correct person using two identifiers. ?Location patient: home ?Location provider: work ?Persons participating in the virtual visit: patient, provider. ?  ?I discussed the limitations, risks, security and privacy concerns of performing an evaluation and management service by telephone and the availability of in person appointments. I also discussed with the patient that there may be a patient responsible charge related to this service. The patient expressed understanding and verbally consented to this telephonic visit.  ?  ?Interactive audio and video telecommunications were attempted between this provider and patient, however failed, due to patient having technical difficulties OR patient did not have access to video capability.  We continued and completed visit with audio only. ? ?  ?Review of Systems    ? ?Cardiac Risk Factors include: advanced age (>27mn, >>74women);diabetes mellitus;hypertension;male gender;dyslipidemia ? ?   ?Objective:  ?  ?Today's Vitals  ? ?There is no height or weight on file to calculate BMI. ? ? ?  03/19/2022  ?  8:20 AM 08/10/2019  ?  9:28 AM 07/22/2019  ?  6:55 PM 12/07/2018  ?  8:13 AM 04/23/2017  ?  2:43 PM 03/24/2012  ? 12:34 PM 12/01/2011  ?  4:36 PM  ?Advanced Directives  ?Does Patient Have a Medical Advance Directive? No  No No No Patient does not have advance directive Patient would like information  ?Would patient like information on creating a medical advance directive? No - Patient declined No - Patient declined No - Patient declined Yes (ED - Information included in AVS) No - Patient declined    ? ? ?Current Medications (verified) ?Outpatient Encounter Medications as of 03/19/2022  ?Medication Sig  ? Accu-Chek Softclix Lancets lancets USE AS DIRECTED UP TO 4 TIMES DAILY  ?  Ascorbic Acid (VITAMIN C PO) Take 1 tablet by mouth daily.   ? aspirin 81 MG tablet Take 81 mg by mouth daily.  ? atorvastatin (LIPITOR) 10 MG tablet Take 1 tablet (10 mg total) by mouth daily.  ? blood glucose meter kit and supplies Dispense based on patient and insurance preference. Use up to four times daily as directed. (FOR ICD-10 E10.9, E11.9).  ? Blood Glucose Monitoring Suppl (BLOOD GLUCOSE METER) kit Use as instructed  ? glucose blood test strip Check blood sugar 3 times daily  ? insulin glargine (LANTUS SOLOSTAR) 100 UNIT/ML Solostar Pen Inject 31 Units into the skin daily.  ? Insulin Pen Needle (BD PEN NEEDLE NANO U/F) 32G X 4 MM MISC USE DAILY AS DIRECTED  ? lisinopril (ZESTRIL) 5 MG tablet Take 1 tablet (5 mg total) by mouth daily.  ? metFORMIN (GLUCOPHAGE) 850 MG tablet Take 1 tablet (850 mg total) by mouth 2 (two) times daily with a meal.  ? acetaminophen-codeine (TYLENOL #3) 300-30 MG tablet Take 1 tablet by mouth every 4 (four) hours as needed. (Patient not taking: Reported on 03/19/2022)  ? donepezil (ARICEPT) 5 MG tablet Take 1 tablet (5 mg total) by mouth at bedtime.  ? ?No facility-administered encounter medications on file as of 03/19/2022.  ? ? ?Allergies (verified) ?Patient has no known allergies.  ? ?History: ?Past Medical History:  ?Diagnosis Date  ? [place]   ? GERD (gastroesophageal reflux disease)   ? no meds  ? Hypertension   ?  controlled on meds  ? Memory loss   ? on Aricept  ? ?Past Surgical History:  ?Procedure Laterality Date  ? ABSCESS DRAINAGE    ? COLONOSCOPY    ? ~ 10 yrs ago  ? FRACTURE SURGERY    ? HARDWARE REMOVAL  03/26/2012  ? Procedure: HARDWARE REMOVAL;  Surgeon: [name], MD;  Location: [facility];  Service: Orthopedics;  Laterality: Left;  hardware removal deep left ankle syndesmotic screws only and stress xrays ankle ?  ? ORIF ANKLE FRACTURE  12/03/2011  ? Procedure: OPEN REDUCTION INTERNAL FIXATION (ORIF) ANKLE  FRACTURE;  Surgeon: [name], MD;  Location: [facility];  Service: Orthopedics;  Laterality: Left;  left medial malleolus fracture  ? PILONIDAL CYST EXCISION  ~1982  ? SKIN GRAFT  ~1970  ? Lt great toe (graft from Lt thigh)  ? ?Family History  ?Problem Relation Age of Onset  ? Cancer Mother   ? Cancer Father   ? Colon cancer Neg Hx   ? Colon polyps Neg Hx   ? Esophageal cancer Neg Hx   ? Rectal cancer Neg Hx   ? Stomach cancer Neg Hx   ? ?Social History  ? ?Socioeconomic History  ? Marital status: Married  ?  Spouse name: Not on file  ? Number of children: 2  ? Years of education: Not on file  ? Highest education level: Not on file  ?Occupational History  ? Occupation: retired  ?Tobacco Use  ? Smoking status: Former  ?  Types: Cigarettes  ?  Quit date: 10/30/1988  ?  Years since quitting: 33.4  ? Smokeless tobacco: Never  ?Vaping Use  ? Vaping Use: Never used  ?Substance and Sexual Activity  ? Alcohol use: Yes  ?  Alcohol/week: 1.0 standard drink  ?  Types: 1 Shots of liquor per week  ?  Comment: every two weeks  ? Drug use: No  ? Sexual activity: Yes  ?  Birth control/protection: None  ?Other Topics Concern  ? Not on file  ?Social History Narrative  ? Not on file  ? ?Social Determinants of Health  ? ?Financial Resource Strain: Low Risk   ? Difficulty of Paying Living Expenses: Not hard at all  ?Food Insecurity: No Food Insecurity  ? Worried About Charity fundraiser in the Last Year: Never true  ? Ran Out of Food in the Last Year: Never true  ?Transportation Needs: No Transportation Needs  ? Lack of Transportation (Medical): No  ? Lack of Transportation (Non-Medical): No  ?Physical Activity: Sufficiently Active  ? Days of Exercise per Week: 3 days  ? Minutes of Exercise per Session: 60 min  ?Stress: No Stress Concern Present  ? Feeling of Stress : Not at all  ?Social Connections: Socially Integrated  ? Frequency of Communication with Friends and Family: Three times a week  ? Frequency of Social  Gatherings with Friends and Family: Three times a week  ? Attends Religious Services: More than 4 times per year  ? Active Member of Clubs or Organizations: Yes  ? Attends Archivist Meetings: More than 4 times per year  ? Marital Status: Married  ? ? ?Tobacco Counseling ?Counseling given: Not Answered ? ? ?Clinical Intake: ? ?Pre-visit preparation completed: Yes ? ?Pain : No/denies pain ? ?  ? ?Nutritional Risks: None ?Diabetes: Yes ?CBG done?: No ?Did pt. bring in CBG monitor from home?: No ? ?How often do you need to  have someone help you when you read instructions, pamphlets, or other written materials from your doctor or pharmacy?: 1 - Never ?What is the last grade level you completed in school?: college ? ?Diabetic?yes ?  Nutrition Risk Assessment: ? ?Has the patient had any N/V/D within the last 2 months?  No  ?Does the patient have any non-healing wounds?  No  ?Has the patient had any unintentional weight loss or weight gain?  No  ? ?Diabetes: ? ?Is the patient diabetic?  Yes  ?If diabetic, was a CBG obtained today?  No  ?Did the patient bring in their glucometer from home?  No  ?How often do you monitor your CBG's? 2x day .  ? ?Financial Strains and Diabetes Management: ? ?Are you having any financial strains with the device, your supplies or your medication? No .  ?Does the patient want to be seen by Chronic Care Management for management of their diabetes?  No  ?Would the patient like to be referred to a Nutritionist or for Diabetic Management?  No  ? ?Diabetic Exams: ? ?Diabetic Eye Exam: Completed 03/2022 ?Diabetic Foot Exam: Overdue, Pt has been advised about the importance in completing this exam. Pt is scheduled for diabetic foot exam on next office visit .  ? ?Interpreter Needed?: No ? ?Information entered by :: [email or web address] ? ? ?Activities of Daily Living ? ?  03/19/2022  ?  8:21 AM 04/24/2021  ?  3:01 PM  ?In your present state of health, do you have any difficulty performing the following  activities:  ?Hearing? 0 0  ?Vision? 0 0  ?Difficulty concentrating or making decisions? 0 1  ?Walking or climbing stairs? 0 0  ?Dressing or bathing? 0 0  ?Doing errands, shopping? 0 1  ?Preparing Food and e

## 2022-03-19 NOTE — Patient Instructions (Signed)
Edward Briggs , ?Thank you for taking time to come for your Medicare Wellness Visit. I appreciate your ongoing commitment to your health goals. Please review the following plan we discussed and let me know if I can assist you in the future.  ? ?Screening recommendations/referrals: ?Colonoscopy: 12/24/2021 ?Recommended yearly ophthalmology/optometry visit for glaucoma screening and checkup ?Recommended yearly dental visit for hygiene and checkup ? ?Vaccinations: ?Influenza vaccine: declined   ?Pneumococcal vaccine: completed  ?Tdap vaccine: due  ?Shingles vaccine: declined    ? ?Advanced directives: none  ? ?Conditions/risks identified: none  ? ?Next appointment: none  ? ?Preventive Care 71 Years and Older, Male ?Preventive care refers to lifestyle choices and visits with your health care provider that can promote health and wellness. ?What does preventive care include? ?A yearly physical exam. This is also called an annual well check. ?Dental exams once or twice a year. ?Routine eye exams. Ask your health care provider how often you should have your eyes checked. ?Personal lifestyle choices, including: ?Daily care of your teeth and gums. ?Regular physical activity. ?Eating a healthy diet. ?Avoiding tobacco and drug use. ?Limiting alcohol use. ?Practicing safe sex. ?Taking low doses of aspirin every day. ?Taking vitamin and mineral supplements as recommended by your health care provider. ?What happens during an annual well check? ?The services and screenings done by your health care provider during your annual well check will depend on your age, overall health, lifestyle risk factors, and family history of disease. ?Counseling  ?Your health care provider may ask you questions about your: ?Alcohol use. ?Tobacco use. ?Drug use. ?Emotional well-being. ?Home and relationship well-being. ?Sexual activity. ?Eating habits. ?History of falls. ?Memory and ability to understand (cognition). ?Work and work Statistician. ?Screening   ?You may have the following tests or measurements: ?Height, weight, and BMI. ?Blood pressure. ?Lipid and cholesterol levels. These may be checked every 5 years, or more frequently if you are over 70 years old. ?Skin check. ?Lung cancer screening. You may have this screening every year starting at age 71 if you have a 30-pack-year history of smoking and currently smoke or have quit within the past 15 years. ?Fecal occult blood test (FOBT) of the stool. You may have this test every year starting at age 71. ?Flexible sigmoidoscopy or colonoscopy. You may have a sigmoidoscopy every 5 years or a colonoscopy every 10 years starting at age 71. ?Prostate cancer screening. Recommendations will vary depending on your family history and other risks. ?Hepatitis C blood test. ?Hepatitis B blood test. ?Sexually transmitted disease (STD) testing. ?Diabetes screening. This is done by checking your blood sugar (glucose) after you have not eaten for a while (fasting). You may have this done every 1-3 years. ?Abdominal aortic aneurysm (AAA) screening. You may need this if you are a current or former smoker. ?Osteoporosis. You may be screened starting at age 29 if you are at high risk. ?Talk with your health care provider about your test results, treatment options, and if necessary, the need for more tests. ?Vaccines  ?Your health care provider may recommend certain vaccines, such as: ?Influenza vaccine. This is recommended every year. ?Tetanus, diphtheria, and acellular pertussis (Tdap, Td) vaccine. You may need a Td booster every 10 years. ?Zoster vaccine. You may need this after age 51. ?Pneumococcal 13-valent conjugate (PCV13) vaccine. One dose is recommended after age 75. ?Pneumococcal polysaccharide (PPSV23) vaccine. One dose is recommended after age 68. ?Talk to your health care provider about which screenings and vaccines you need and how often  you need them. ?This information is not intended to replace advice given to you by  your health care provider. Make sure you discuss any questions you have with your health care provider. ?Document Released: 11/16/2015 Document Revised: 07/09/2016 Document Reviewed: 08/21/2015 ?Elsevier Interactive Patient Education ? 2017 [place]. ? ?Fall Prevention in the Home ?Falls can cause injuries. They can happen to people of all ages. There are many things you can do to make your home safe and to help prevent falls. ?What can I do on the outside of my home? ?Regularly fix the edges of walkways and driveways and fix any cracks. ?Remove anything that might make you trip as you walk through a door, such as a raised step or threshold. ?Trim any bushes or trees on the path to your home. ?Use bright outdoor lighting. ?Clear any walking paths of anything that might make someone trip, such as rocks or tools. ?Regularly check to see if handrails are loose or broken. Make sure that both sides of any steps have handrails. ?Any raised decks and porches should have guardrails on the edges. ?Have any leaves, snow, or ice cleared regularly. ?Use sand or salt on walking paths during winter. ?Clean up any spills in your garage right away. This includes oil or grease spills. ?What can I do in the bathroom? ?Use night lights. ?Install grab bars by the toilet and in the tub and shower. Do not use towel bars as grab bars. ?Use non-skid mats or decals in the tub or shower. ?If you need to sit down in the shower, use a plastic, non-slip stool. ?Keep the floor dry. Clean up any water that spills on the floor as soon as it happens. ?Remove soap buildup in the tub or shower regularly. ?Attach bath mats securely with double-sided non-slip rug tape. ?Do not have throw rugs and other things on the floor that can make you trip. ?What can I do in the bedroom? ?Use night lights. ?Make sure that you have a light by your bed that is easy to reach. ?Do not use any sheets or blankets that are too big for your bed. They should not hang  down onto the floor. ?Have a firm chair that has side arms. You can use this for support while you get dressed. ?Do not have throw rugs and other things on the floor that can make you trip. ?What can I do in the kitchen? ?Clean up any spills right away. ?Avoid walking on wet floors. ?Keep items that you use a lot in easy-to-reach places. ?If you need to reach something above you, use a strong step stool that has a grab bar. ?Keep electrical cords out of the way. ?Do not use floor polish or wax that makes floors slippery. If you must use wax, use non-skid floor wax. ?Do not have throw rugs and other things on the floor that can make you trip. ?What can I do with my stairs? ?Do not leave any items on the stairs. ?Make sure that there are handrails on both sides of the stairs and use them. Fix handrails that are broken or loose. Make sure that handrails are as long as the stairways. ?Check any carpeting to make sure that it is firmly attached to the stairs. Fix any carpet that is loose or worn. ?Avoid having throw rugs at the top or bottom of the stairs. If you do have throw rugs, attach them to the floor with carpet tape. ?Make sure that you have a light  switch at the top of the stairs and the bottom of the stairs. If you do not have them, ask someone to add them for you. ?What else can I do to help prevent falls? ?Wear shoes that: ?Do not have high heels. ?Have rubber bottoms. ?Are comfortable and fit you well. ?Are closed at the toe. Do not wear sandals. ?If you use a stepladder: ?Make sure that it is fully opened. Do not climb a closed stepladder. ?Make sure that both sides of the stepladder are locked into place. ?Ask someone to hold it for you, if possible. ?Clearly mark and make sure that you can see: ?Any grab bars or handrails. ?First and last steps. ?Where the edge of each step is. ?Use tools that help you move around (mobility aids) if they are needed. These  include: ?Canes. ?Walkers. ?Scooters. ?Crutches. ?Turn on the lights when you go into a dark area. Replace any light bulbs as soon as they burn out. ?Set up your furniture so you have a clear path. Avoid moving your furniture around. ?If any of your flo

## 2022-03-23 NOTE — Progress Notes (Signed)
  Subjective:  Patient ID: Edward Briggs, male    DOB: 1951/10/26,  MRN: [id number]  Edward Briggs presents to clinic today for preventative diabetic foot care and painful elongated mycotic toenails 1-5 bilaterally which are tender when wearing enclosed shoe gear. Pain is relieved with periodic professional debridement.  Patient states blood glucose was 164 mg/dl today.  Last known HgA1c was unknown.  New problem(s): None.   PCP is [name], MD , and last visit was February 13, 2022.  No Known Allergies  Review of Systems: Negative except as noted in the HPI.  Objective: No changes noted in today's physical examination. General: Patient is a pleasant 71 y.o. African American male in NAD. AAO x 3.   Neurovascular Examination: Capillary refill time to digits immediate b/l. Palpable DP pulse(s) b/l LE. Faintly palpable PT pulse(s) b/l LE. Pedal hair sparse. No pain with calf compression b/l. Lower extremity skin temperature gradient within normal limits. No cyanosis or clubbing noted b/l LE.  Protective sensation intact 5/5 intact bilaterally with 10g monofilament b/l.  Dermatological:  Pedal integument with normal turgor, texture and tone BLE. No open wounds b/l lower extremities. No interdigital macerations noted b/l LE. Toenails 2-5 bilaterally well maintained with adequate length. No erythema, no edema, no drainage, no fluctuance. Pincer nail deformity bilateral great toes. No erythema, no edema, no drainage, no fluctuance. Nail border hypertrophy minimal.  Sign(s) of infection: no clinical signs of infection noted on examination today..  Musculoskeletal:  Normal muscle strength 5/5 to all lower extremity muscle groups bilaterally. HAV with bunion deformity noted b/l LE.[name] No pain, crepitus or joint limitation noted with ROM b/l LE.  Patient ambulates independently without assistive aids.     Latest Ref Rng & Units 01/02/2022   12:12 PM 10/03/2021   11:40 AM 06/27/2021   10:59 AM  03/28/2021   11:25 AM  Hemoglobin A1C  Hemoglobin-A1c 4.0 - 5.6 % 8.3   8.4   8.1   8.5     Assessment/Plan: 1. Pain due to onychomycosis of toenails of both feet     -Patient was evaluated and treated. All patient's and/or POA's questions/concerns answered on today's visit. -Continue diabetic foot care principles: inspect feet daily, monitor glucose as recommended by PCP and/or Endocrinologist, and follow prescribed diet per PCP, Endocrinologist and/or dietician. -Patient to continue soft, supportive shoe gear daily. -Toenails 1-5 b/l were debrided in length and girth with sterile nail nippers and dremel without iatrogenic bleeding.  -Offending nail border debrided and curretaged bilateral great toes utilizing sterile nail nipper and currette. Border cleansed with alcohol and triple antibiotic applied. No further treatment required by patient/caregiver. Call office if there are any concerns. -Patient/POA to call should there be question/concern in the interim.   Return in about 3 months (around 06/17/2022).  Marzetta Board, DPM

## 2022-03-27 ENCOUNTER — Ambulatory Visit: Payer: Medicare Other | Admitting: Family Medicine

## 2022-04-02 ENCOUNTER — Ambulatory Visit (INDEPENDENT_AMBULATORY_CARE_PROVIDER_SITE_OTHER): Payer: Medicare Other | Admitting: Family Medicine

## 2022-04-02 ENCOUNTER — Encounter: Payer: Self-pay | Admitting: Family Medicine

## 2022-04-02 VITALS — BP 136/88 | HR 89 | Temp 98.2°F | Resp 16 | Ht 72.0 in | Wt 250.6 lb

## 2022-04-02 DIAGNOSIS — E1165 Type 2 diabetes mellitus with hyperglycemia: Secondary | ICD-10-CM | POA: Diagnosis not present

## 2022-04-02 DIAGNOSIS — Z794 Long term (current) use of insulin: Secondary | ICD-10-CM

## 2022-04-02 LAB — POCT GLYCOSYLATED HEMOGLOBIN (HGB A1C): Hemoglobin A1C: 7.8 % — AB (ref 4.0–5.6)

## 2022-04-02 MED ORDER — METFORMIN HCL 1000 MG PO TABS: 1000.0000 mg | ORAL_TABLET | Freq: Two times a day (BID) | ORAL | 3 refills | Status: DC

## 2022-04-02 NOTE — Patient Instructions (Addendum)
Increase metformin to '1000mg'$  dose twice per day.  Stay at 34 units lantus for now.  If blood pressure goes over 140 on top number or 90 on bottom number, let me know. No changes for now.

## 2022-04-02 NOTE — Progress Notes (Signed)
Subjective:  Patient ID: Edward Briggs, male    DOB: 06-25-1951  Age: 71 y.o. MRN: [id number]  CC:  Chief Complaint  Patient presents with   Diabetes    6 weeks follow up. Pt has no concerns at this time.   Hypertension    6 week follow up.    HPI [name] presents for   Diabetes: Insulin-dependent, complicated by hyperglycemia.  Metformin 850 mg twice daily, Lantus increased to 31 units prior to his April 13 visit.  Home readings 115-155 at that time with 200 postprandials.  Lantus increased to 33 units/day with hypoglycemia precautions. Now on lantus 34 units Fasting: 124-150 Postprandial: under 200 usually.  No symptomatic lows. Lowest 111.   Lab Results  Component Value Date   HGBA1C 7.8 (A) 04/02/2022   HGBA1C 8.3 (A) 01/02/2022   HGBA1C 8.4 (H) 10/03/2021   Lab Results  Component Value Date   MICROALBUR <0.7 03/28/2021   [place] 68 10/03/2021   CREATININE 0.91 10/03/2021   Hypertension: Borderline prior. Lisinopril 68m qd. No missed doses typically.  Home readings: 130/88 BP Readings from Last 3 Encounters:  04/02/22 136/88  02/13/22 138/70  01/02/22 130/80   Wt Readings from Last 3 Encounters:  04/02/22 250 lb 9.6 oz (113.7 kg)  02/13/22 254 lb 6.4 oz (115.4 kg)  01/02/22 255 lb (115.7 kg)    Lab Results  Component Value Date   CREATININE 0.91 10/03/2021     History Patient Active Problem List   Diagnosis Date Noted   HTN (hypertension) 050/53/9767  Acute metabolic encephalopathy 034/19/3790  AKI (acute kidney injury) ([facility] 07/22/2019   Sepsis ([facility] 07/22/2019   UTI (urinary tract infection) 07/22/2019   Diabetes mellitus without complication ([facility] 124/07/7352  CHEST PAIN 07/21/2009   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   Past Surgical History:  Procedure  Laterality Date   ABSCESS DRAINAGE     COLONOSCOPY     ~ 10 yrs ago   FWallace 03/26/2012   Procedure: HARDWARE REMOVAL;  Surgeon: PColin Rhein MD;  [facility]  Service: Orthopedics;  Laterality: Left;  hardware removal deep left ankle syndesmotic screws only and stress xrays ankle    ORIF ANKLE FRACTURE  12/03/2011   Procedure: OPEN REDUCTION INTERNAL FIXATION (ORIF) ANKLE FRACTURE;  Surgeon: PColin Rhein MD;  [place] [facility]  Service: Orthopedics;  Laterality: Left;  left medial malleolus fracture   PILONIDAL CYST EXCISION  ~1982   SKIN GRAFT  ~1970   Lt great toe (graft from Lt thigh)   No Known Allergies Prior to Admission medications   Medication Sig Start Date End Date Taking? Authorizing Provider  Accu-Chek Softclix Lancets lancets USE AS DIRECTED UP TO 4 TIMES DAILY 01/22/22  Yes GWendie Agreste MD  Ascorbic Acid (VITAMIN C PO) Take 1 tablet by mouth daily.    Yes [provider]  aspirin 81 MG tablet Take 81 mg by mouth daily.   Yes [provider]  atorvastatin (LIPITOR) 10 MG tablet Take 1 tablet (10 mg total) by mouth daily. 01/02/22  Yes GWendie Agreste MD  blood glucose meter kit and supplies Dispense based on patient and  insurance preference. Use up to four times daily as directed. (FOR ICD-10 E10.9, E11.9). 11/25/21  Yes [name], MD  Blood Glucose Monitoring Suppl (BLOOD GLUCOSE METER) kit Use as instructed 09/10/12  Yes [name], MD  donepezil (ARICEPT) 5 MG tablet Take 1 tablet (5 mg total) by mouth at bedtime. 05/29/21 04/02/22 Yes Camara, Maryan Puls, MD  glucose blood test strip Check blood sugar 3 times daily 11/25/21  Yes [name], MD  insulin glargine (LANTUS SOLOSTAR) 100 UNIT/ML Solostar Pen Inject 31 Units into the skin daily. 01/02/22  Yes [name], MD  Insulin Pen Needle (BD PEN NEEDLE NANO U/F) 32G X 4 MM MISC USE DAILY AS DIRECTED  03/28/21  Yes [name], MD  lisinopril (ZESTRIL) 5 MG tablet Take 1 tablet (5 mg total) by mouth daily. 01/02/22  Yes [name], MD  metFORMIN (GLUCOPHAGE) 850 MG tablet Take 1 tablet (850 mg total) by mouth 2 (two) times daily with a meal. 01/02/22  Yes [name], MD  acetaminophen-codeine (TYLENOL #3) 300-30 MG tablet Take 1 tablet by mouth every 4 (four) hours as needed. Patient not taking: Reported on 03/19/2022 03/13/22   [provider]   Social History   Socioeconomic History   Marital status: Married    Spouse name: Not on file   Number of children: 2   Years of education: Not on file   Highest education level: Not on file  Occupational History   Occupation: retired  Tobacco Use   Smoking status: Former    Types: Cigarettes    Quit date: 10/30/1988    Years since quitting: 33.4   Smokeless tobacco: Never  Vaping Use   Vaping Use: Never used  Substance and Sexual Activity   Alcohol use: Yes    Alcohol/week: 1.0 standard drink    Types: 1 Shots of liquor per week    Comment: every two weeks   Drug use: No   Sexual activity: Yes    Birth control/protection: None  Other Topics Concern   Not on file  Social History Narrative   Not on file   Social Determinants of Health   Financial Resource Strain: Low Risk    Difficulty of Paying Living Expenses: Not hard at all  Food Insecurity: No Food Insecurity   Worried About Charity fundraiser in the Last Year: Never true   [place] in the Last Year: Never true  Transportation Needs: No Transportation Needs   Lack of Transportation (Medical): No   Lack of Transportation (Non-Medical): No  Physical Activity: Sufficiently Active   Days of Exercise per Week: 3 days   Minutes of Exercise per Session: 60 min  Stress: No Stress Concern Present   Feeling of Stress : Not at all  Social Connections: Socially Integrated   Frequency of Communication with Friends and Family: Three times a week    Frequency of Social Gatherings with Friends and Family: Three times a week   Attends Religious Services: More than 4 times per year   Active Member of Clubs or Organizations: Yes   Attends Music therapist: More than 4 times per year   Marital Status: Married  Human resources officer Violence: Not At Risk   Fear of Current or Ex-Partner: No   Emotionally Abused: No   Physically Abused: No   Sexually Abused: No    Review of Systems  Constitutional:  Negative for fatigue and unexpected weight change.  Eyes:  Negative for visual disturbance.  Respiratory:  Negative for cough, chest tightness and shortness of breath.   Cardiovascular:  Negative for chest pain, palpitations and leg swelling.  Gastrointestinal:  Negative for abdominal pain and blood in stool.  Neurological:  Negative for dizziness, light-headedness and headaches.    Objective:   Vitals:   04/02/22 1113  BP: 136/88  Pulse: 89  Resp: 16  Temp: 98.2 F (36.8 C)  TempSrc: Temporal  SpO2: 98%  Weight: 250 lb 9.6 oz (113.7 kg)  Height: 6' (1.829 m)     Physical Exam Vitals reviewed.  Constitutional:      Appearance: He is well-developed.  HENT:     Head: Normocephalic and atraumatic.  Neck:     Vascular: No carotid bruit or JVD.  Cardiovascular:     Rate and Rhythm: Normal rate and regular rhythm.     Heart sounds: Normal heart sounds. No murmur heard. Pulmonary:     Effort: Pulmonary effort is normal.     Breath sounds: Normal breath sounds. No rales.  Musculoskeletal:     Right lower leg: No edema.     Left lower leg: No edema.  Skin:    General: Skin is warm and dry.  Neurological:     Mental Status: He is alert and oriented to person, place, and time.  Psychiatric:        Mood and Affect: Mood normal.    Results for orders placed or performed in visit on 04/02/22  POCT HgB A1C  Result Value Ref Range   Hemoglobin A1C 7.8 (A) 4.0 - 5.6 %   HbA1c POC (<> result, manual entry)     HbA1c,  POC (prediabetic range)     HbA1c, POC (controlled diabetic range)        Assessment & Plan:  BURHAN BARHAM is a 71 y.o. male . Type 2 diabetes mellitus with hyperglycemia, with long-term current use of insulin ([place]) - Plan: POCT HgB A1C, metFORMIN (GLUCOPHAGE) 1000 MG tablet Improving but not quite at goal.  Postprandials likely driving elevated readings.  For now we will try slight higher dose of metformin with potential side effects discussed.  Return to 850 mg dose if GI side effects.  Continue same dose of Lantus for now with recheck in 3 months.  RTC precautions  No change in antihypertensives for now with home monitoring, option for slight higher dose of lisinopril.  RTC precautions  Meds ordered this encounter  Medications   metFORMIN (GLUCOPHAGE) 1000 MG tablet    Sig: Take 1 tablet (1,000 mg total) by mouth 2 (two) times daily with a meal.    Dispense:  180 tablet    Refill:  3   Patient Instructions  Increase metformin to 1082m dose twice per day.  Stay at 34 units lantus for now.  If blood pressure goes over 140 on top number or 90 on bottom number, let me know. No changes for now.     Signed,   JMerri Ray MD [facility] 04/02/22 10:17 PM

## 2022-04-04 LAB — HM DIABETES EYE EXAM

## 2022-04-08 ENCOUNTER — Encounter: Payer: Self-pay | Admitting: Family Medicine

## 2022-04-09 ENCOUNTER — Encounter: Payer: Self-pay | Admitting: *Deleted

## 2022-04-09 ENCOUNTER — Telehealth: Payer: Self-pay | Admitting: Neurology

## 2022-04-09 NOTE — Telephone Encounter (Signed)
Pt's wife is asking for a letter to be prepared to excuse pt from [employer], please call when letter can be picked up

## 2022-04-09 NOTE — Telephone Encounter (Signed)
The patient is under [name] care for Alzheimer's Disease. Recent MMSE 21/30. Letter will be prepared to excuse him from jury duty.

## 2022-04-09 NOTE — Telephone Encounter (Signed)
The letter has been written, signed and placed up front. I called his wife and she will stop by to pick it up. Provided our office hours.

## 2022-05-09 ENCOUNTER — Other Ambulatory Visit: Payer: Self-pay | Admitting: Family Medicine

## 2022-05-09 DIAGNOSIS — E1165 Type 2 diabetes mellitus with hyperglycemia: Secondary | ICD-10-CM

## 2022-06-23 ENCOUNTER — Ambulatory Visit (INDEPENDENT_AMBULATORY_CARE_PROVIDER_SITE_OTHER): Payer: Medicare Other | Admitting: Podiatry

## 2022-06-23 ENCOUNTER — Encounter: Payer: Self-pay | Admitting: Podiatry

## 2022-06-23 DIAGNOSIS — B351 Tinea unguium: Secondary | ICD-10-CM

## 2022-06-23 DIAGNOSIS — E119 Type 2 diabetes mellitus without complications: Secondary | ICD-10-CM

## 2022-06-23 DIAGNOSIS — M79674 Pain in right toe(s): Secondary | ICD-10-CM

## 2022-06-23 DIAGNOSIS — M79675 Pain in left toe(s): Secondary | ICD-10-CM

## 2022-06-29 NOTE — Progress Notes (Signed)
  Subjective:  Patient ID: Edward Briggs, male    DOB: [birth year],  MRN: [id number]  71 y.o. male presents preventative diabetic foot care and painful thick toenails that are difficult to trim. Pain interferes with ambulation. Aggravating factors include wearing enclosed shoe gear. Pain is relieved with periodic professional debridement.  Last known HgA1c was unknown. Patient did not check blood glucose this morning.  New problem(s): None   PCP is [name], MD , and last visit was 0531/2023.  No Known Allergies  Review of Systems: Negative except as noted in the HPI.   Objective:  [name] is a pleasant 71 y.o. male in NAD. AAO x 3.  Vascular Examination: Vascular status intact b/l with palpable DP pulses b/ and faintly palpable PT pulses b/l. CFT immediate b/l. No edema. No pain with calf compression b/l. Skin temperature gradient WNL b/l. Pedal hair sparse.  Neurological Examination: Sensation grossly intact b/l with 10 gram monofilament. Vibratory sensation intact b/l.   Dermatological Examination: Pedal skin with normal turgor, texture and tone b/l. Toenails 1-5 b/l thick, discolored, elongated with subungual debris and pain on dorsal palpation. No hyperkeratotic lesions noted b/l.   Musculoskeletal Examination: Muscle strength 5/5 to b/l LE. HAV with bunion deformity noted b/l LE. Patient ambulates independent of any assistive aids.  Radiographs: None  Last A1c:      Latest Ref Rng & Units 04/02/2022   11:26 AM 01/02/2022   12:12 PM 10/03/2021   11:40 AM  Hemoglobin A1C  Hemoglobin-A1c 4.0 - 5.6 % 7.8  8.3  8.4    Assessment:   1. Pain due to onychomycosis of toenails of both feet   2. Diabetes mellitus without complication ([place])    Plan:  -Examined patient. -No new findings. No new orders. -Patient to continue soft, supportive shoe gear daily. -Mycotic toenails 1-5 bilaterally were debrided in length and girth with sterile nail nippers and dremel without  incident. -Patient/POA to call should there be question/concern in the interim.  Return in about 3 months (around 09/23/2022).  Marzetta Board, DPM

## 2022-07-03 ENCOUNTER — Ambulatory Visit (INDEPENDENT_AMBULATORY_CARE_PROVIDER_SITE_OTHER): Payer: Medicare Other | Admitting: Family Medicine

## 2022-07-03 ENCOUNTER — Encounter: Payer: Self-pay | Admitting: Family Medicine

## 2022-07-03 ENCOUNTER — Ambulatory Visit (INDEPENDENT_AMBULATORY_CARE_PROVIDER_SITE_OTHER)
Admission: RE | Admit: 2022-07-03 | Discharge: 2022-07-03 | Disposition: A | Discharge status: Home / Self Care | Payer: Medicare Other | Source: Ambulatory Visit | Attending: Family Medicine | Admitting: Family Medicine

## 2022-07-03 VITALS — BP 134/64 | HR 75 | Temp 98.1°F | Wt 252.2 lb

## 2022-07-03 DIAGNOSIS — M25561 Pain in right knee: Secondary | ICD-10-CM

## 2022-07-03 DIAGNOSIS — M545 Low back pain, unspecified: Secondary | ICD-10-CM

## 2022-07-03 DIAGNOSIS — E119 Type 2 diabetes mellitus without complications: Secondary | ICD-10-CM

## 2022-07-03 DIAGNOSIS — G8929 Other chronic pain: Secondary | ICD-10-CM

## 2022-07-03 DIAGNOSIS — I10 Essential (primary) hypertension: Secondary | ICD-10-CM | POA: Diagnosis not present

## 2022-07-03 DIAGNOSIS — E1165 Type 2 diabetes mellitus with hyperglycemia: Secondary | ICD-10-CM

## 2022-07-03 DIAGNOSIS — E1169 Type 2 diabetes mellitus with other specified complication: Secondary | ICD-10-CM

## 2022-07-03 DIAGNOSIS — E785 Hyperlipidemia, unspecified: Secondary | ICD-10-CM

## 2022-07-03 DIAGNOSIS — Z9189 Other specified personal risk factors, not elsewhere classified: Secondary | ICD-10-CM | POA: Diagnosis not present

## 2022-07-03 DIAGNOSIS — Z794 Long term (current) use of insulin: Secondary | ICD-10-CM | POA: Diagnosis not present

## 2022-07-03 LAB — COMPREHENSIVE METABOLIC PANEL
ALT: 15 U/L (ref 0–53)
AST: 14 U/L (ref 0–37)
Albumin: 4.1 g/dL (ref 3.5–5.2)
Alkaline Phosphatase: 56 U/L (ref 39–117)
BUN: 13 mg/dL (ref 6–23)
CO2: 27 mEq/L (ref 19–32)
Calcium: 9.3 mg/dL (ref 8.4–10.5)
Chloride: 100 mEq/L (ref 96–112)
Creatinine, Ser: 0.96 mg/dL (ref 0.40–1.50)
GFR: 79.98 mL/min (ref >60.00)
Glucose, Bld: 262 mg/dL — ABNORMAL HIGH (ref 70–99)
Potassium: 4.4 mEq/L (ref 3.5–5.1)
Sodium: 135 mEq/L (ref 135–145)
Total Bilirubin: 0.6 mg/dL (ref 0.2–1.2)
Total Protein: 7.6 g/dL (ref 6.0–8.3)

## 2022-07-03 LAB — LIPID PANEL
Cholesterol: 141 mg/dL (ref 0–200)
HDL: 41.5 mg/dL (ref >39.00)
LDL Cholesterol: 77 mg/dL (ref 0–99)
NonHDL: 99.56
Total CHOL/HDL Ratio: 3
Triglycerides: 114 mg/dL (ref 0.0–149.0)
VLDL: 22.8 mg/dL (ref 0.0–40.0)

## 2022-07-03 LAB — HEMOGLOBIN A1C: Hgb A1c MFr Bld: 8.1 % — ABNORMAL HIGH (ref 4.6–6.5)

## 2022-07-03 MED ORDER — LISINOPRIL 5 MG PO TABS: 5.0000 mg | ORAL_TABLET | Freq: Every day | ORAL | 1 refills | Status: DC

## 2022-07-03 MED ORDER — ATORVASTATIN CALCIUM 10 MG PO TABS: 10.0000 mg | ORAL_TABLET | Freq: Every day | ORAL | 1 refills | Status: DC

## 2022-07-03 NOTE — Patient Instructions (Addendum)
Please have x-ray of knee and back performed at the [facility] location below.  If any concerns on x-ray I will let you know.  Okay to continue Aspercreme for now, Tylenol is okay if needed.  Over-the-counter Voltaren gel is also an option if needed short-term.  Follow-up in the next few weeks if not continuing to improve, sooner if worse.  [facility]  Walk in 8:30-4:30 during weekdays, no appointment needed [place].  [place], [place] [id number]   I will check labs today and we can decide if further changes needed for your medications.  Let me know if there are questions and take care  Acute Knee Pain, Adult Acute knee pain is sudden and may be caused by damage, swelling, or irritation of the muscles and tissues that support the knee. Pain may result from: A fall. An injury to the knee from twisting motions. A hit to the knee. Infection. Acute knee pain may go away on its own with time and rest. If it does not, your health care provider may order tests to find the cause of the pain. These may include: Imaging tests, such as an X-ray, MRI, CT scan, or ultrasound. Joint aspiration. In this test, fluid is removed from the knee and evaluated. Arthroscopy. In this test, a lighted tube is inserted into the knee and an image is projected onto a TV screen. Biopsy. In this test, a sample of tissue is removed from the body and studied under a microscope. Follow these instructions at home: If you have a knee sleeve or brace:  Wear the knee sleeve or brace as told by your health care provider. Remove it only as told by your health care provider. Loosen it if your toes tingle, become numb, or turn cold and blue. Keep it clean. If the knee sleeve or brace is not waterproof: Do not let it get wet. Cover it with a watertight covering when you take a bath or shower. Activity Rest your knee. Do not do things that cause pain or make pain worse. Avoid high-impact activities or exercises, such as  running, jumping rope, or doing jumping jacks. Work with a physical therapist to make a safe exercise program, as recommended by your health care provider. Do exercises as told by your physical therapist. Managing pain, stiffness, and swelling  If directed, put ice on the affected knee. To do this: If you have a removable knee sleeve or brace, remove it as told by your health care provider. Put ice in a plastic bag. Place a towel between your skin and the bag. Leave the ice on for 20 minutes, 2-3 times a day. Remove the ice if your skin turns bright red. This is very important. If you cannot feel pain, heat, or cold, you have a greater risk of damage to the area. If directed, use an elastic bandage to put pressure (compression) on your injured knee. This may control swelling, give support, and help with discomfort. Raise (elevate) your knee above the level of your heart while you are sitting or lying down. Sleep with a pillow under your knee. General instructions Take over-the-counter and prescription medicines only as told by your health care provider. Do not use any products that contain nicotine or tobacco, such as cigarettes, e-cigarettes, and chewing tobacco. If you need help quitting, ask your health care provider. If you are overweight, work with your health care provider and a dietitian to set a weight-loss goal that is healthy and reasonable for  you. Extra weight can put pressure on your knee. Pay attention to any changes in your symptoms. Keep all follow-up visits. This is important. Contact a health care provider if: Your knee pain continues, changes, or gets worse. You have a fever along with knee pain. Your knee feels warm to the touch or is red. Your knee buckles or locks up. Get help right away if: Your knee swells, and the swelling becomes worse. You cannot move your knee. You have severe pain in your knee that cannot be managed with pain medicine. Summary Acute knee pain  can be caused by a fall, an injury, an infection, or damage, swelling, or irritation of the tissues that support your knee. Your health care provider may perform tests to find out the cause of the pain. Pay attention to any changes in your symptoms. Relieve your pain with rest, medicines, light activity, and the use of ice. Get help right away if your knee swells, you cannot move your knee, or you have severe pain that cannot be managed with medicine. This information is not intended to replace advice given to you by your health care provider. Make sure you discuss any questions you have with your health care provider. Document Revised: 04/04/2020 Document Reviewed: 04/04/2020 Elsevier Patient Education  [place].  Chronic Back Pain When back pain lasts longer than 3 months, it is called chronic back pain. The cause of your back pain may not be known. Some common causes include: Wear and tear (degenerative disease) of the bones, ligaments, or disks in your back. Inflammation and stiffness in your back (arthritis). People who have chronic back pain often go through certain periods in which the pain is more intense (flare-ups). Many people can learn to manage the pain with home care. Follow these instructions at home: Pay attention to any changes in your symptoms. Take these actions to help with your pain: Managing pain and stiffness     If directed, apply ice to the painful area. Your health care provider may recommend applying ice during the first 24-48 hours after a flare-up begins. To do this: Put ice in a plastic bag. Place a towel between your skin and the bag. Leave the ice on for 20 minutes, 2-3 times per day. If directed, apply heat to the affected area as often as told by your health care provider. Use the heat source that your health care provider recommends, such as a moist heat pack or a heating pad. Place a towel between your skin and the heat source. Leave the heat on  for 20-30 minutes. Remove the heat if your skin turns bright red. This is especially important if you are unable to feel pain, heat, or cold. You may have a greater risk of getting burned. Try soaking in a warm tub. Activity  Avoid bending and other activities that make the problem worse. Maintain a proper position when standing or sitting: When standing, keep your upper back and neck straight, with your shoulders pulled back. Avoid slouching. When sitting, keep your back straight and relax your shoulders. Do not round your shoulders or pull them backward. Do not sit or stand in one place for long periods of time. Take brief periods of rest throughout the day. This will reduce your pain. Resting in a lying or standing position is usually better than sitting to rest. When you are resting for longer periods, mix in some mild activity or stretching between periods of rest. This will help to prevent  stiffness and pain. Get regular exercise. Ask your health care provider what activities are safe for you. Do not lift anything that is heavier than 10 lb (4.5 kg), or the limit that you are told, until your health care provider says that it is safe. Always use proper lifting technique, which includes: Bending your knees. Keeping the load close to your body. Avoiding twisting. Sleep on a firm mattress in a comfortable position. Try lying on your side with your knees slightly bent. If you lie on your back, put a pillow under your knees. Medicines Treatment may include medicines for pain and inflammation taken by mouth or applied to the skin, prescription pain medicine, or muscle relaxants. Take over-the-counter and prescription medicines only as told by your health care provider. Ask your health care provider if the medicine prescribed to you: Requires you to avoid driving or using machinery. Can cause constipation. You may need to take these actions to prevent or treat constipation: Drink enough fluid  to keep your urine pale yellow. Take over-the-counter or prescription medicines. Eat foods that are high in fiber, such as beans, whole grains, and fresh fruits and vegetables. Limit foods that are high in fat and processed sugars, such as fried or sweet foods. General instructions Do not use any products that contain nicotine or tobacco, such as cigarettes, e-cigarettes, and chewing tobacco. If you need help quitting, ask your health care provider. Keep all follow-up visits as told by your health care provider. This is important. Contact a health care provider if: You have pain that is not relieved with rest or medicine. Your pain gets worse, or you have new pain. You have a high fever. You have rapid weight loss. You have trouble doing your normal activities. Get help right away if: You have weakness or numbness in one or both of your legs or feet. You have trouble controlling your bladder or your bowels. You have severe back pain and have any of the following: Nausea or vomiting. Pain in your abdomen. Shortness of breath or you faint. Summary Chronic back pain is back pain that lasts longer than 3 months. When a flare-up begins, apply ice to the painful area for the first 24-48 hours. Apply a moist heat pad or use a heating pad on the painful area as directed by your health care provider. When you are resting for longer periods, mix in some mild activity or stretching between periods of rest. This will help to prevent stiffness and pain. This information is not intended to replace advice given to you by your health care provider. Make sure you discuss any questions you have with your health care provider. Document Revised: 11/30/2019 Document Reviewed: 11/30/2019 Elsevier Patient Education  [place].

## 2022-07-03 NOTE — Progress Notes (Signed)
Subjective:  Patient ID: Edward Briggs, male    DOB: 11-May-1951  Age: 71 y.o. MRN: [id number]  CC:  Chief Complaint  Patient presents with   Diabetes   Knee Pain    Recent fall, July 15, put ointment on it and it seem like it got better but still bothers him some    Back Pain    Pt sates pain has been going on 6 months , its his lower back. Pt has tried ointment with no luck, pain scale is 6     HPI Edward Briggs presents for   R Knee pain Fall July 15, tripped on steps, hit top of knee on carpeted step. No wounds.  treated with over-the-counter aspercreme or similar product.  Overall has improved, still some discomfort. No difficulty with weight bearing. No locking/giving way.  Sore on inside of knee with certain positions. No prior injection/surgery.   Low back pain Past 6 months,  no injury.  Attempted treatment of topical cream - some relief.  Lower mid back. No leg radiation.  No fever, unexplained wt loss or night sweats.  No bowel or bladder incontinence, no saddle anesthesia, no lower extremity weakness.  No prior PT, injection or other treatment for back pain prior.   Diabetes: Insulin-dependent, complicated by hyperglycemia.  Treated with metformin 150 mg twice daily, Lantus.  Last visit May 31.  34 units/day at that time.  He is on ACE inhibitor, metformin was increased to 1000 mg twice daily.  Continue same dose Lantus. Toelrating current regimen.no new side effects. Home readings fasting 120-160 Home readings postprandial none Symptomatic lows - none. Lowest 120  Microalbumin: Normal May 2022 Optho, foot exam, pneumovax: Foot exam due, declined. On statin, ACE inhibitor.  Lab Results  Component Value Date   HGBA1C 7.8 (A) 04/02/2022   HGBA1C 8.3 (A) 01/02/2022   HGBA1C 8.4 (H) 10/03/2021   Lab Results  Component Value Date   MICROALBUR <0.7 03/28/2021   LDLCALC 68 10/03/2021   CREATININE 0.91 10/03/2021    History Patient Active Problem List    Diagnosis Date Noted   HTN (hypertension) 35/36/1443   Acute metabolic encephalopathy 15/40/0867   AKI (acute kidney injury) ([place]) 07/22/2019   Sepsis ([place]) 07/22/2019   UTI (urinary tract infection) 07/22/2019   Diabetes mellitus without complication ([place]) 61/95/0932   CHEST PAIN 07/21/2009   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   Past Surgical History:  Procedure Laterality Date   ABSCESS DRAINAGE     COLONOSCOPY     ~ 10 yrs ago   [place]  03/26/2012   Procedure: HARDWARE REMOVAL;  Surgeon: [name], MD;  Location: [facility];  Service: Orthopedics;  Laterality: Left;  hardware removal deep left ankle syndesmotic screws only and stress xrays ankle    ORIF ANKLE FRACTURE  12/03/2011   Procedure: OPEN REDUCTION INTERNAL FIXATION (ORIF) ANKLE FRACTURE;  Surgeon: [name], MD;  Location: [facility];  Service: Orthopedics;  Laterality: Left;  left medial malleolus fracture   PILONIDAL CYST EXCISION  ~1982   SKIN GRAFT  ~1970   Lt great toe (graft from Lt thigh)   No Known Allergies Prior to Admission medications  Medication Sig Start Date End Date Taking? Authorizing Provider  Accu-Chek Softclix Lancets lancets USE AS DIRECTED UP TO 4 TIMES DAILY 01/22/22  Yes [name], MD  acetaminophen-codeine (TYLENOL #3) 300-30 MG tablet Take 1 tablet by mouth every 4 (four) hours as needed. 03/13/22  Yes [provider]  Ascorbic Acid (VITAMIN C PO) Take 1 tablet by mouth daily.    Yes [provider]  aspirin 81 MG tablet Take 81 mg by mouth daily.   Yes [provider]  atorvastatin (LIPITOR) 10 MG tablet Take 1 tablet (10 mg total) by mouth daily. 01/02/22  Yes [name], MD  blood glucose meter kit and supplies  Dispense based on patient and insurance preference. Use up to four times daily as directed. (FOR ICD-10 E10.9, E11.9). 11/25/21  Yes [name], MD  Blood Glucose Monitoring Suppl (BLOOD GLUCOSE METER) kit Use as instructed 09/10/12  Yes [name], MD  glucose blood test strip Check blood sugar 3 times daily 11/25/21  Yes [name], MD  Insulin Pen Needle (BD PEN NEEDLE NANO U/F) 32G X 4 MM MISC USE DAILY AS DIRECTED 03/28/21  Yes [name], MD  LANTUS SOLOSTAR 100 UNIT/ML Solostar Pen INJECT 30 UNITS SUBCUTANEOUSLY ONCE DAILY 05/09/22  Yes [name], MD  lisinopril (ZESTRIL) 5 MG tablet Take 1 tablet (5 mg total) by mouth daily. 01/02/22  Yes [name], MD  metFORMIN (GLUCOPHAGE) 1000 MG tablet Take 1 tablet (1,000 mg total) by mouth 2 (two) times daily with a meal. 04/02/22  Yes [name], MD  donepezil (ARICEPT) 5 MG tablet Take 1 tablet (5 mg total) by mouth at bedtime. 05/29/21 04/02/22  [name], MD   Social History   Socioeconomic History   Marital status: Married    Spouse name: Not on file   Number of children: 2   Years of education: Not on file   Highest education level: Not on file  Occupational History   Occupation: retired  Tobacco Use   Smoking status: Former    Types: Cigarettes    Quit date: 10/30/1988    Years since quitting: 33.6   Smokeless tobacco: Never  Vaping Use   Vaping Use: Never used  Substance and Sexual Activity   Alcohol use: Yes    Alcohol/week: 1.0 standard drink of alcohol    Types: 1 Shots of liquor per week    Comment: every two weeks   Drug use: No   Sexual activity: Yes    Birth control/protection: None  Other Topics Concern   Not on file  Social History Narrative   Not on file   Social Determinants of Health   Financial Resource Strain: Low Risk  (03/19/2022)   Overall Financial Resource Strain (CARDIA)    Difficulty of Paying Living Expenses: Not hard at all  Food Insecurity: No Food  Insecurity (03/19/2022)   Hunger Vital Sign    Worried About Running Out of Food in the Last Year: Never true    Ran Out of Food in the Last Year: Never true  Transportation Needs: No Transportation Needs (03/19/2022)   PRAPARE - Hydrologist (Medical): No    Lack of Transportation (Non-Medical): No  Physical Activity: Sufficiently Active (03/19/2022)   Exercise Vital Sign    Days of Exercise per Week: 3 days    Minutes of Exercise per Session: 60 min  Stress: No Stress Concern Present (03/19/2022)  [place]    Feeling of Stress : Not at all  Social Connections: Socially Integrated (03/19/2022)   Social Connection and Isolation Panel [NHANES]    Frequency of Communication with Friends and Family: Three times a week    Frequency of Social Gatherings with Friends and Family: Three times a week    Attends Religious Services: More than 4 times per year    Active Member of Clubs or Organizations: Yes    Attends Archivist Meetings: More than 4 times per year    Marital Status: Married  Human resources officer Violence: Not At Risk (03/19/2022)   Humiliation, Afraid, Rape, and Kick questionnaire    Fear of Current or Ex-Partner: No    Emotionally Abused: No    Physically Abused: No    Sexually Abused: No    Review of Systems  Constitutional:  Negative for fatigue and unexpected weight change.  Eyes:  Negative for visual disturbance.  Respiratory:  Negative for cough, chest tightness and shortness of breath.   Cardiovascular:  Negative for chest pain, palpitations and leg swelling.  Gastrointestinal:  Negative for abdominal pain and blood in stool.  Neurological:  Negative for dizziness, light-headedness and headaches.     Objective:   Vitals:   07/03/22 0922  BP: 134/64  Pulse: 75  Temp: 98.1 F (36.7 C)  SpO2: 97%  Weight: 252 lb 3.2 oz (114.4 kg)     Physical Exam Vitals  reviewed.  Constitutional:      Appearance: He is well-developed.  HENT:     Head: Normocephalic and atraumatic.  Neck:     Vascular: No carotid bruit or JVD.  Cardiovascular:     Rate and Rhythm: Normal rate and regular rhythm.     Heart sounds: Normal heart sounds. No murmur heard. Pulmonary:     Effort: Pulmonary effort is normal.     Breath sounds: Normal breath sounds. No rales.  Musculoskeletal:     Right lower leg: No edema.     Left lower leg: No edema.     Comments: Lumbar spine, minimal discomfort at lower mid lumbar spine without focal bony tenderness.  Negative seated straight leg raise.  Minimal lateral flexion, rotation bilaterally but overall intact range of motion.  Ambulating without difficulty.  Right knee, skin intact, no ecchymosis or apparent effusion.  Patella nontender, lateral joint line nontender.  Some discomfort over the medial joint line without crepitus.  Negative varus, valgus testing, no specific pain with McMurray testing.  Skin:    General: Skin is warm and dry.  Neurological:     Mental Status: He is alert and oriented to person, place, and time.  Psychiatric:        Mood and Affect: Mood normal.     Assessment & Plan:  Edward Briggs is a 71 y.o. male . Type 2 diabetes mellitus with hyperglycemia, with long-term current use of insulin (HCC) - Plan: atorvastatin (LIPITOR) 10 MG tablet, Comprehensive metabolic panel, Hemoglobin A1c At increased risk for cardiovascular disease - Plan: atorvastatin (LIPITOR) 10 MG tablet  -Fairly reassuring readings above, check A1c to determine further medication changes, tolerating higher dose of metformin.  Continue Lipitor same dose as tolerated.    Essential hypertension - Plan: lisinopril (ZESTRIL) 5 MG tablet  -Stable on current lisinopril dose, continue same.  Hyperlipidemia associated with type 2 diabetes mellitus ([place]) - Plan: Lipid panel -as above.  Acute pain of right knee -  Plan: DG Knee Complete 4  Views Right  -Medial knee pain, possible underlying degenerative disease, less likely fracture, check imaging.  Continue symptomatic care.  Chronic midline low back pain without sciatica - Plan: DG Lumbar Spine Complete  -Handout given on treatment, check imaging.  Likely degenerative disease, mechanical pain.  Tylenol with short course of Voltaren gel for knee, back discussed.  Continue Aspercreme if effective and recheck next few weeks if not improving.  Meds ordered this encounter  Medications   atorvastatin (LIPITOR) 10 MG tablet    Sig: Take 1 tablet (10 mg total) by mouth daily.    Dispense:  90 tablet    Refill:  1   lisinopril (ZESTRIL) 5 MG tablet    Sig: Take 1 tablet (5 mg total) by mouth daily.    Dispense:  90 tablet    Refill:  1   Patient Instructions  Please have x-ray of knee and back performed at the [facility] location below.  If any concerns on x-ray I will let you know.  Okay to continue Aspercreme for now, Tylenol is okay if needed.  Over-the-counter Voltaren gel is also an option if needed short-term.  Follow-up in the next few weeks if not continuing to improve, sooner if worse.  [facility]  Walk in 8:30-4:30 during weekdays, no appointment needed Walker.  [place], [place] [address]   I will check labs today and we can decide if further changes needed for your medications.  Let me know if there are questions and take care  Acute Knee Pain, Adult Acute knee pain is sudden and may be caused by damage, swelling, or irritation of the muscles and tissues that support the knee. Pain may result from: A fall. An injury to the knee from twisting motions. A hit to the knee. Infection. Acute knee pain may go away on its own with time and rest. If it does not, your health care provider may order tests to find the cause of the pain. These may include: Imaging tests, such as an X-ray, MRI, CT scan, or ultrasound. Joint aspiration. In this test, fluid is removed  from the knee and evaluated. Arthroscopy. In this test, a lighted tube is inserted into the knee and an image is projected onto a TV screen. Biopsy. In this test, a sample of tissue is removed from the body and studied under a microscope. Follow these instructions at home: If you have a knee sleeve or brace:  Wear the knee sleeve or brace as told by your health care provider. Remove it only as told by your health care provider. Loosen it if your toes tingle, become numb, or turn cold and blue. Keep it clean. If the knee sleeve or brace is not waterproof: Do not let it get wet. Cover it with a watertight covering when you take a bath or shower. Activity Rest your knee. Do not do things that cause pain or make pain worse. Avoid high-impact activities or exercises, such as running, jumping rope, or doing jumping jacks. Work with a physical therapist to make a safe exercise program, as recommended by your health care provider. Do exercises as told by your physical therapist. Managing pain, stiffness, and swelling  If directed, put ice on the affected knee. To do this: If you have a removable knee sleeve or brace, remove it as told by your health care provider. Put ice in a plastic bag. Place a towel between your skin and the bag. Leave  the ice on for 20 minutes, 2-3 times a day. Remove the ice if your skin turns bright red. This is very important. If you cannot feel pain, heat, or cold, you have a greater risk of damage to the area. If directed, use an elastic bandage to put pressure (compression) on your injured knee. This may control swelling, give support, and help with discomfort. Raise (elevate) your knee above the level of your heart while you are sitting or lying down. Sleep with a pillow under your knee. General instructions Take over-the-counter and prescription medicines only as told by your health care provider. Do not use any products that contain nicotine or tobacco, such as  cigarettes, e-cigarettes, and chewing tobacco. If you need help quitting, ask your health care provider. If you are overweight, work with your health care provider and a dietitian to set a weight-loss goal that is healthy and reasonable for you. Extra weight can put pressure on your knee. Pay attention to any changes in your symptoms. Keep all follow-up visits. This is important. Contact a health care provider if: Your knee pain continues, changes, or gets worse. You have a fever along with knee pain. Your knee feels warm to the touch or is red. Your knee buckles or locks up. Get help right away if: Your knee swells, and the swelling becomes worse. You cannot move your knee. You have severe pain in your knee that cannot be managed with pain medicine. Summary Acute knee pain can be caused by a fall, an injury, an infection, or damage, swelling, or irritation of the tissues that support your knee. Your health care provider may perform tests to find out the cause of the pain. Pay attention to any changes in your symptoms. Relieve your pain with rest, medicines, light activity, and the use of ice. Get help right away if your knee swells, you cannot move your knee, or you have severe pain that cannot be managed with medicine. This information is not intended to replace advice given to you by your health care provider. Make sure you discuss any questions you have with your health care provider. Document Revised: 04/04/2020 Document Reviewed: 04/04/2020 Elsevier Patient Education  [place].  Chronic Back Pain When back pain lasts longer than 3 months, it is called chronic back pain. The cause of your back pain may not be known. Some common causes include: Wear and tear (degenerative disease) of the bones, ligaments, or disks in your back. Inflammation and stiffness in your back (arthritis). People who have chronic back pain often go through certain periods in which the pain is more  intense (flare-ups). Many people can learn to manage the pain with home care. Follow these instructions at home: Pay attention to any changes in your symptoms. Take these actions to help with your pain: Managing pain and stiffness     If directed, apply ice to the painful area. Your health care provider may recommend applying ice during the first 24-48 hours after a flare-up begins. To do this: Put ice in a plastic bag. Place a towel between your skin and the bag. Leave the ice on for 20 minutes, 2-3 times per day. If directed, apply heat to the affected area as often as told by your health care provider. Use the heat source that your health care provider recommends, such as a moist heat pack or a heating pad. Place a towel between your skin and the heat source. Leave the heat on for 20-30 minutes. Remove  the heat if your skin turns bright red. This is especially important if you are unable to feel pain, heat, or cold. You may have a greater risk of getting burned. Try soaking in a warm tub. Activity  Avoid bending and other activities that make the problem worse. Maintain a proper position when standing or sitting: When standing, keep your upper back and neck straight, with your shoulders pulled back. Avoid slouching. When sitting, keep your back straight and relax your shoulders. Do not round your shoulders or pull them backward. Do not sit or stand in one place for long periods of time. Take brief periods of rest throughout the day. This will reduce your pain. Resting in a lying or standing position is usually better than sitting to rest. When you are resting for longer periods, mix in some mild activity or stretching between periods of rest. This will help to prevent stiffness and pain. Get regular exercise. Ask your health care provider what activities are safe for you. Do not lift anything that is heavier than 10 lb (4.5 kg), or the limit that you are told, until your health care  provider says that it is safe. Always use proper lifting technique, which includes: Bending your knees. Keeping the load close to your body. Avoiding twisting. Sleep on a firm mattress in a comfortable position. Try lying on your side with your knees slightly bent. If you lie on your back, put a pillow under your knees. Medicines Treatment may include medicines for pain and inflammation taken by mouth or applied to the skin, prescription pain medicine, or muscle relaxants. Take over-the-counter and prescription medicines only as told by your health care provider. Ask your health care provider if the medicine prescribed to you: Requires you to avoid driving or using machinery. Can cause constipation. You may need to take these actions to prevent or treat constipation: Drink enough fluid to keep your urine pale yellow. Take over-the-counter or prescription medicines. Eat foods that are high in fiber, such as beans, whole grains, and fresh fruits and vegetables. Limit foods that are high in fat and processed sugars, such as fried or sweet foods. General instructions Do not use any products that contain nicotine or tobacco, such as cigarettes, e-cigarettes, and chewing tobacco. If you need help quitting, ask your health care provider. Keep all follow-up visits as told by your health care provider. This is important. Contact a health care provider if: You have pain that is not relieved with rest or medicine. Your pain gets worse, or you have new pain. You have a high fever. You have rapid weight loss. You have trouble doing your normal activities. Get help right away if: You have weakness or numbness in one or both of your legs or feet. You have trouble controlling your bladder or your bowels. You have severe back pain and have any of the following: Nausea or vomiting. Pain in your abdomen. Shortness of breath or you faint. Summary Chronic back pain is back pain that lasts longer than 3  months. When a flare-up begins, apply ice to the painful area for the first 24-48 hours. Apply a moist heat pad or use a heating pad on the painful area as directed by your health care provider. When you are resting for longer periods, mix in some mild activity or stretching between periods of rest. This will help to prevent stiffness and pain. This information is not intended to replace advice given to you by your health care provider. Make  sure you discuss any questions you have with your health care provider. Document Revised: 11/30/2019 Document Reviewed: 11/30/2019 Elsevier Patient Education  [place],   [name], MD [place] 07/03/22 10:14 AM

## 2022-09-30 ENCOUNTER — Encounter: Payer: Self-pay | Admitting: Podiatry

## 2022-09-30 ENCOUNTER — Ambulatory Visit (INDEPENDENT_AMBULATORY_CARE_PROVIDER_SITE_OTHER): Payer: Medicare Other | Admitting: Podiatry

## 2022-09-30 DIAGNOSIS — E119 Type 2 diabetes mellitus without complications: Secondary | ICD-10-CM

## 2022-09-30 DIAGNOSIS — M79674 Pain in right toe(s): Secondary | ICD-10-CM | POA: Diagnosis not present

## 2022-09-30 DIAGNOSIS — B351 Tinea unguium: Secondary | ICD-10-CM | POA: Diagnosis not present

## 2022-09-30 DIAGNOSIS — M2011 Hallux valgus (acquired), right foot: Secondary | ICD-10-CM | POA: Diagnosis not present

## 2022-09-30 DIAGNOSIS — M2012 Hallux valgus (acquired), left foot: Secondary | ICD-10-CM

## 2022-09-30 DIAGNOSIS — M79675 Pain in left toe(s): Secondary | ICD-10-CM

## 2022-10-01 NOTE — Progress Notes (Signed)
ANNUAL DIABETIC FOOT EXAM  Subjective: Edward Briggs presents today preventative diabetic foot care, for annual diabetic foot examination, and painful elongated mycotic toenails 1-5 bilaterally which are tender when wearing enclosed shoe gear. Pain is relieved with periodic professional debridement..  Chief Complaint  Patient presents with   Nail Problem    Diabetic foot care BS-102 A1C- "Can't remember  PCP-[name] PCP VST-"About 2 months ago"    Patient confirms h/o diabetes.  Patient relates 2-3 year h/o diabetes.  Patient denies any h/o foot wounds.  Patient denies any numbness, tingling, burning, or pins/needle sensation in feet.  Risk factors: diabetes, HTN.  [name], MD is patient's PCP. Last visit was July 03, 2022.  Past Medical History:  Diagnosis Date   Diabetes mellitus without complication ([place])    GERD (gastroesophageal reflux disease)    no meds   Hypertension    controlled on meds   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   Patient Active Problem List   Diagnosis Date Noted   HTN (hypertension) 61/60/7371   Acute metabolic encephalopathy 04/28/9484   AKI (acute kidney injury) ([place]) 07/22/2019   Sepsis ([place]) 07/22/2019   UTI (urinary tract infection) 07/22/2019   Diabetes mellitus without complication ([place]) 46/27/0350   CHEST PAIN 07/21/2009   Past Surgical History:  Procedure Laterality Date   ABSCESS DRAINAGE     COLONOSCOPY     ~ 10 yrs ago   [place]  03/26/2012   Procedure: HARDWARE REMOVAL;  Surgeon: [name], MD;  Location: [facility];  Service: Orthopedics;  Laterality: Left;  hardware removal deep left ankle syndesmotic screws only and stress xrays ankle    ORIF ANKLE FRACTURE  12/03/2011   Procedure: OPEN REDUCTION INTERNAL FIXATION (ORIF) ANKLE FRACTURE;  Surgeon: [name], MD;  Location: [facility];  Service: Orthopedics;  Laterality: Left;  left medial  malleolus fracture   PILONIDAL CYST EXCISION  ~1982   SKIN GRAFT  ~1970   Lt great toe (graft from Lt thigh)   Current Outpatient Medications on File Prior to Visit  Medication Sig Dispense Refill   Accu-Chek Softclix Lancets lancets USE AS DIRECTED UP TO 4 TIMES DAILY 100 each 0   acetaminophen-codeine (TYLENOL #3) 300-30 MG tablet Take 1 tablet by mouth every 4 (four) hours as needed.     Ascorbic Acid (VITAMIN C PO) Take 1 tablet by mouth daily.      aspirin 81 MG tablet Take 81 mg by mouth daily.     atorvastatin (LIPITOR) 10 MG tablet Take 1 tablet (10 mg total) by mouth daily. 90 tablet 1   blood glucose meter kit and supplies Dispense based on patient and insurance preference. Use up to four times daily as directed. (FOR ICD-10 E10.9, E11.9). 1 each 0   Blood Glucose Monitoring Suppl (BLOOD GLUCOSE METER) kit Use as instructed 1 each 0   donepezil (ARICEPT) 5 MG tablet Take 1 tablet (5 mg total) by mouth at bedtime. 90 tablet 4   glucose blood test strip Check blood sugar 3 times daily 100 each 4   Insulin Pen Needle (BD PEN NEEDLE NANO U/F) 32G X 4 MM MISC USE DAILY AS DIRECTED 100 each 2   LANTUS SOLOSTAR 100 UNIT/ML Solostar Pen INJECT 30 UNITS SUBCUTANEOUSLY ONCE DAILY 36 mL 0   lisinopril (ZESTRIL) 5 MG tablet Take 1 tablet (5 mg total) by mouth daily. 90 tablet  1   metFORMIN (GLUCOPHAGE) 1000 MG tablet Take 1 tablet (1,000 mg total) by mouth 2 (two) times daily with a meal. 180 tablet 3   No current facility-administered medications on file prior to visit.    No Known Allergies Social History   Occupational History   Occupation: retired  Tobacco Use   Smoking status: Former    Types: Cigarettes    Quit date: 10/30/1988    Years since quitting: 33.9   Smokeless tobacco: Never  Vaping Use   Vaping Use: Never used  Substance and Sexual Activity   Alcohol use: Yes    Alcohol/week: 1.0 standard drink of alcohol    Types: 1 Shots of liquor per week    Comment: every two  weeks   Drug use: No   Sexual activity: Yes    Birth control/protection: None   Family History  Problem Relation Age of Onset   Cancer Mother    Cancer Father    Colon cancer Neg Hx    Colon polyps Neg Hx    Esophageal cancer Neg Hx    Rectal cancer Neg Hx    Stomach cancer Neg Hx    Immunization History  Administered Date(s) Administered   PFIZER Comirnaty([place])Covid-19 Tri-Sucrose Vaccine 12/16/2019, 01/10/2020, 08/31/2020   Pneumococcal Conjugate-13 04/23/2017   Pneumococcal Polysaccharide-23 12/07/2018     Review of Systems: Negative except as noted in the HPI.   Objective: There were no vitals filed for this visit.  Edward Briggs is a pleasant 71 y.o. male in NAD. AAO X 3.  Vascular Examination: Capillary refill time immediate b/l.Vascular status intact b/l with palpable pedal pulses. Pedal hair sparse b/l. No edema. No pain with calf compression b/l. Skin temperature gradient WNL b/l.   Neurological Examination: Sensation grossly intact b/l with 10 gram monofilament. Vibratory sensation intact b/l.   Dermatological Examination: Pedal skin with normal turgor, texture and tone b/l. Toenails 1-5 b/l thick, discolored, elongated with subungual debris and pain on dorsal palpation. Incurvated nailplate medial border of right great toe.  Nail border hypertrophy absent. There is tenderness to palpation. Sign(s) of infection: no clinical signs of infection noted on examination today..  Musculoskeletal Examination: Normal muscle strength 5/5 to all lower extremity muscle groups bilaterally. HAV with bunion deformity noted b/l LE.[name] No pain, crepitus or joint limitation noted with ROM b/l LE.  Patient ambulates independently without assistive aids.  Radiographs: None  Last A1c:      Latest Ref Rng & Units 07/03/2022   10:31 AM 04/02/2022   11:26 AM 01/02/2022   12:12 PM 10/03/2021   11:40 AM  Hemoglobin A1C  Hemoglobin-A1c 4.6 - 6.5 % 8.1  7.8  8.3  8.4    Footwear  Assessment: Does the patient wear appropriate shoes? Yes. Does the patient need inserts/orthotics? No.  ADA Risk Categorization: Low Risk :  Patient has all of the following: Intact protective sensation No prior foot ulcer  No severe deformity Pedal pulses present  Assessment: 1. Pain due to onychomycosis of toenails of both feet   2. Hallux valgus, acquired, bilateral   3. Diabetes mellitus without complication ([place])   4. Encounter for diabetic foot exam [facility])     Plan: -Patient was evaluated and treated. All patient's and/or POA's questions/concerns answered on today's visit. -Diabetic foot examination performed today. -Mycotic toenails 1-5 bilaterally were debrided in length and girth with sterile nail nippers and dremel without incident. -No invasive procedure(s) performed. Offending nail border debrided and curretaged  medial border of right great toe utilizing sterile nail nipper and currette. Border(s) cleansed with alcohol and triple antibiotic ointment applied. Patient/POA/Caregiver/Facility instructed to apply triple antibiotic ointment  to right great toe once daily for 7 days. Call office if there are any concerns. -Patient/POA to call should there be question/concern in the interim. Return in about 3 months (around 12/31/2022).  Marzetta Board, DPM

## 2022-11-24 ENCOUNTER — Other Ambulatory Visit: Payer: Self-pay | Admitting: Neurology

## 2022-12-01 ENCOUNTER — Encounter: Payer: Self-pay | Admitting: Neurology

## 2022-12-01 ENCOUNTER — Ambulatory Visit: Payer: Medicare Other | Admitting: Neurology

## 2022-12-01 VITALS — BP 124/78 | HR 73 | Ht 73.0 in | Wt 262.0 lb

## 2022-12-01 DIAGNOSIS — G20A1 Parkinson's disease without dyskinesia, without mention of fluctuations: Secondary | ICD-10-CM

## 2022-12-01 DIAGNOSIS — R4189 Other symptoms and signs involving cognitive functions and awareness: Secondary | ICD-10-CM

## 2022-12-01 MED ORDER — CARBIDOPA-LEVODOPA 25-100 MG PO TABS: 1.0000 | ORAL_TABLET | Freq: Three times a day (TID) | ORAL | 4 refills | Status: DC

## 2022-12-01 MED ORDER — DONEPEZIL HCL 10 MG PO TABS: 10.0000 mg | ORAL_TABLET | Freq: Every day | ORAL | 3 refills | Status: DC

## 2022-12-01 NOTE — Progress Notes (Signed)
[facility]  PATIENT: Edward Briggs DOB: July 29, 1951  REFERRING CLINICIAN: [name], MD HISTORY FROM: From patient and wife [name] Tavano  REASON FOR VISIT: Memory problems    HISTORICAL  CHIEF COMPLAINT:  Chief Complaint  Patient presents with   Follow-up    Rm 12 with wife here for f/u. Reports memory stable. Needs refill on Aricept tolerating med well.     INTERVAL HISTORY 12/01/2022:  Patient presents today for follow-up, he is accompanied by wife. Since last visit, they reported he is stable.  He does have some good days and and some bad days.  Wife reports that sometimes he is forgetful but overall he is doing well, still independent.  He is compliant with the Aricept, denies any side effect from the medicine.  Wife reported patient does not exercise.  They deny any fall, and no REM sleep behavior.   HISTORY OF PRESENT ILLNESS:  This is a 72 year old gentleman with past medical history of diabetes, hypertension, hyperlipidemia who is presenting for memory problem.  Patient states that "He does not feel too bad about his memory".  He reports that he feels a little more forgetful lately.  He reported he is still independent, able to carry activities of daily living but still a little forgetful.  Wife reports that patient does not usually remember what they have been talking about.  He has asked her to repeat the same thing over and over. Wife reported memory problem has been going on for the past 6 months and it is getting worse.  Most of the issues are related to missing objects. He has missed his wallet, misplaced his phone and does not remember previous conversations.  He is able to cook, clean, bath and dress himself.  He has not had any recent accident or get lost in familiar places.  He continues to drive but for only for short distance.  For finances, they do pay the bills together;  wife reports that in the past he had missed some payments but  since they have been working together to pay the bills, they have not had any problems with missing payment. 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  He had recent lab work: His TSH is 1.64, B12 224, his previous HbA1c is 8.6.  His last brain imaging was a CT of the head done in September 2020 and showed some cerebral atrophy some small vessel white matter disease but no acute intracranial pathology.     REVIEW OF SYSTEMS: Full 14 system review of systems performed and negative with exception of: as noted in the HPI  ALLERGIES: No Known Allergies  HOME MEDICATIONS: Outpatient Medications Prior to Visit  Medication Sig Dispense Refill   Accu-Chek Softclix Lancets lancets USE AS DIRECTED UP TO 4 TIMES DAILY 100 each 0   acetaminophen-codeine (TYLENOL #3) 300-30 MG tablet Take 1 tablet by mouth every 4 (four) hours as needed.     Ascorbic Acid (VITAMIN C PO) Take 1 tablet by mouth daily.      aspirin 81 MG tablet Take 81 mg by mouth daily.     atorvastatin (LIPITOR) 10 MG tablet Take 1 tablet (10 mg total) by mouth daily. 90 tablet 1   blood glucose meter kit and supplies Dispense based on patient and insurance preference. Use up to four times daily as directed. (FOR ICD-10 E10.9, E11.9). 1 each 0   Blood  Glucose Monitoring Suppl (BLOOD GLUCOSE METER) kit Use as instructed 1 each 0   glucose blood test strip Check blood sugar 3 times daily 100 each 4   Insulin Pen Needle (BD PEN NEEDLE NANO U/F) 32G X 4 MM MISC USE DAILY AS DIRECTED 100 each 2   LANTUS SOLOSTAR 100 UNIT/ML Solostar Pen INJECT 30 UNITS SUBCUTANEOUSLY ONCE DAILY 36 mL 0   lisinopril (ZESTRIL) 5 MG tablet Take 1 tablet (5 mg total) by mouth daily. 90 tablet 1   metFORMIN (GLUCOPHAGE) 1000 MG tablet Take 1 tablet (1,000 mg total) by mouth 2 (two) times daily with a meal. 180 tablet 3   donepezil (ARICEPT) 5 MG tablet Take 1 tablet (5 mg total) by mouth at bedtime. 90  tablet 4   No facility-administered medications prior to visit.    PAST MEDICAL HISTORY: Past Medical History:  Diagnosis Date   Diabetes mellitus without complication ([place])    GERD (gastroesophageal reflux disease)    no meds   Hypertension    controlled on meds   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    PAST SURGICAL HISTORY: Past Surgical History:  Procedure Laterality Date   ABSCESS DRAINAGE     COLONOSCOPY     ~ 10 yrs ago   [place] REMOVAL  03/26/2012   Procedure: HARDWARE REMOVAL;  Surgeon: [name], MD;  Location: [facility];  Service: Orthopedics;  Laterality: Left;  hardware removal deep left ankle syndesmotic screws only and stress xrays ankle    ORIF ANKLE FRACTURE  12/03/2011   Procedure: OPEN REDUCTION INTERNAL FIXATION (ORIF) ANKLE FRACTURE;  Surgeon: [name], MD;  Location: [facility];  Service: Orthopedics;  Laterality: Left;  left medial malleolus fracture   PILONIDAL CYST EXCISION  ~1982   SKIN GRAFT  ~1970   Lt great toe (graft from Lt thigh)    FAMILY HISTORY: Family History  Problem Relation Age of Onset   Cancer Mother    Cancer Father    Colon cancer Neg Hx    Colon polyps Neg Hx    Esophageal cancer Neg Hx    Rectal cancer Neg Hx    Stomach cancer Neg Hx     SOCIAL HISTORY: Social History   Socioeconomic History   Marital status: Married    Spouse name: Not on file   Number of children: 2   Years of education: Not on file   Highest education level: Not on file  Occupational History   Occupation: retired  Tobacco Use   Smoking status: Former    Types: Cigarettes    Quit date: 10/30/1988    Years since quitting: 34.1   Smokeless tobacco: Never  Vaping Use   Vaping Use: Never used  Substance and Sexual Activity   Alcohol use: Yes    Alcohol/week: 1.0 standard drink of alcohol    Types: 1 Shots of liquor per week    Comment: every two weeks   Drug use: No   Sexual activity:  Yes    Birth control/protection: None  Other Topics Concern   Not on file  Social History Narrative   Not on file   Social Determinants of Health   Financial Resource Strain: Low Risk  (03/19/2022)   Overall Financial Resource Strain (CARDIA)    Difficulty of Paying Living Expenses: Not hard at all  Food Insecurity: No Food Insecurity (03/19/2022)   Hunger Vital Sign  Worried About Charity fundraiser in the Last Year: Never true    [place] in the Last Year: Never true  Transportation Needs: No Transportation Needs (03/19/2022)   PRAPARE - Hydrologist (Medical): No    Lack of Transportation (Non-Medical): No  Physical Activity: Sufficiently Active (03/19/2022)   Exercise Vital Sign    Days of Exercise per Week: 3 days    Minutes of Exercise per Session: 60 min  Stress: No Stress Concern Present (03/19/2022)   [place]    Feeling of Stress : Not at all  Social Connections: [place] (03/19/2022)   Social Connection and Isolation Panel [NHANES]    Frequency of Communication with Friends and Family: Three times a week    Frequency of Social Gatherings with Friends and Family: Three times a week    Attends Religious Services: More than 4 times per year    Active Member of Clubs or Organizations: Yes    Attends Archivist Meetings: More than 4 times per year    Marital Status: Married  Human resources officer Violence: Not At Risk (03/19/2022)   Humiliation, Afraid, Rape, and Kick questionnaire    Fear of Current or Ex-Partner: No    Emotionally Abused: No    Physically Abused: No    Sexually Abused: No     PHYSICAL EXAM  GENERAL EXAM/CONSTITUTIONAL: Vitals:  Vitals:   12/01/22 0904  BP: 124/78  Pulse: 73  Weight: 262 lb (118.8 kg)  Height: '6\' 1"'$  (1.854 m)    Body mass index is 34.57 kg/m. Wt Readings from Last 3 Encounters:  12/01/22 262 lb (118.8 kg)   07/03/22 252 lb 3.2 oz (114.4 kg)  04/02/22 250 lb 9.6 oz (113.7 kg)   Patient is in no distress; well developed, nourished and groomed; neck is supple.  EYES: Visual fields full to confrontation, Extraocular movements intacts,  No results found.  MUSCULOSKELETAL: Gait, strength, tone, movements noted in Neurologic exam below  NEUROLOGIC: MENTAL STATUS:     05/29/2021    2:57 PM  MMSE - Mini Mental State Exam  Orientation to time 4  Orientation to Place 4  Registration 3  Attention/ Calculation 2  Recall 2  Language- name 2 objects 2  Language- repeat 1  Language- follow 3 step command 2  Language- follow 3 step command-comments handed paper back to me  Language- read & follow direction 0  Language-read & follow direction-comments did not close eyes  Write a sentence 1  Copy design 0  Total score 21    CRANIAL NERVE:  2nd, 3rd, 4th, 6th -visual fields full to confrontation, extraocular muscles intact, no nystagmus 5th - facial sensation symmetric 7th - decrease facial expression (masked facies) 8th - hearing intact 9th - palate elevates symmetrically, uvula midline 11th - shoulder shrug symmetric 12th - tongue protrusion midline Glabellar reflex present   MOTOR:  normal bulk. There is increase tone and cogwheel rigidity. Bradykinesia is also present  SENSORY:  normal and symmetric to light touch,  COORDINATION:  finger-nose-finger, fine finger movements normal. No resting or action tremors noted    GAIT/STATION:  Difficulty getting up with arms crossed, but was able to do so after multiple attempts. Shuffling gait, decrease arm swing, turn in block. Positive pull test.      DIAGNOSTIC DATA (LABS, IMAGING, TESTING) - I reviewed patient records, labs, notes, testing and imaging myself where available.  Lab Results  Component Value Date   WBC 8.2 07/25/2019   HGB 12.2 (L) 07/25/2019   HCT 36.4 (L) 07/25/2019   MCV 94.3 07/25/2019   PLT 213 07/25/2019       Component Value Date/Time   NA 135 07/03/2022 1031   NA 138 01/19/2020 1118   K 4.4 07/03/2022 1031   CL 100 07/03/2022 1031   CO2 27 07/03/2022 1031   GLUCOSE 262 (H) 07/03/2022 1031   BUN 13 07/03/2022 1031   BUN 11 01/19/2020 1118   CREATININE 0.96 07/03/2022 1031   CREATININE 0.86 08/20/2015 0924   CALCIUM 9.3 07/03/2022 1031   PROT 7.6 07/03/2022 1031   PROT 7.3 01/19/2020 1118   ALBUMIN 4.1 07/03/2022 1031   ALBUMIN 4.3 01/19/2020 1118   AST 14 07/03/2022 1031   ALT 15 07/03/2022 1031   ALKPHOS 56 07/03/2022 1031   BILITOT 0.6 07/03/2022 1031   BILITOT 0.6 01/19/2020 1118   GFRNONAA 78 01/19/2020 1118   GFRNONAA >89 08/20/2015 0924   GFRAA 90 01/19/2020 1118   GFRAA >89 08/20/2015 0924   Lab Results  Component Value Date   CHOL 141 07/03/2022   HDL 41.50 07/03/2022   LDLCALC 77 07/03/2022   TRIG 114.0 07/03/2022   CHOLHDL 3 07/03/2022   Lab Results  Component Value Date   HGBA1C 8.1 (H) 07/03/2022   Lab Results  Component Value Date   BPZWCHEN27 782 04/24/2021   Lab Results  Component Value Date   TSH 1.64 04/24/2021    CT Head 07/2019: Cerebral atrophy, no acute intracranial pathology.    Routine EEG 07/04/2021 This is an abnormal EEG recording in the waking and sleeping state due to diffuse generalized slowing. No interictal epileptiform discharges were seen at any time during the recording.  Diffuse slowing is consistent with non specific generalized brain dysfunction such as encephalopathy. No seizures captured.    ASSESSMENT AND PLAN  72 y.o. year old male here for follow up for memory problem.  He does have mild cognitive impairment and is and is already on Aricept 5 mg nightly, will increase to 10 mg nightly.  The most striking finding today was his parkinsonism.  He does have bradykinesia, cogwheel rigidity, decreased arm swing, shuffling gait noted on exam.  Patient likely have Parkinson disease.  I will start him on Sinemet 25/100, advised  him to take twice daily for 2 weeks then increase to 3 times daily as tolerated.  They denied any abnormal REM sleep behavior or falls but due to his gait abnormality I will refer him to physical therapy and I strongly advised him to start exercising.  I will see him in 3 months for follow-up.    1. Cognitive impairment   2. Parkinson's disease without dyskinesia, unspecified whether manifestations fluctuate     Patient Instructions  Increase Aricept to 10 mg nightly Start Sinemet 25/100, twice daily for the next 2 weeks then increase to 3 times daily as tolerated Referral to physical therapy for gait training Strongly advised him to start exercise Follow-up in 3 months or sooner if worse.    Orders Placed This Encounter  Procedures   Ambulatory referral to Physical Therapy    Meds ordered this encounter  Medications   donepezil (ARICEPT) 10 MG tablet    Sig: Take 1 tablet (10 mg total) by mouth at bedtime.    Dispense:  90 tablet    Refill:  3   carbidopa-levodopa (SINEMET IR) 25-100 MG tablet  Sig: Take 1 tablet by mouth 3 (three) times daily.    Dispense:  90 tablet    Refill:  4   --------------------------------------------------------------------------------------------  There are well-accepted and sensible ways to reduce risk for Alzheimers disease and other degenerative brain disorders .  Exercise Daily Walk A daily 20 minute walk should be part of your routine. Disease related apathy can be a significant roadblock to exercise and the only way to overcome this is to make it a daily routine and perhaps have a reward at the end (something your loved one loves to eat or drink perhaps) or a personal trainer coming to the home can also be very useful. Most importantly, the patient is much more likely to exercise if the caregiver / spouse does it with him/her. In general a structured, repetitive schedule is best.  General Health: Any diseases which effect your body will  effect your brain such as a pneumonia, urinary infection, blood clot, heart attack or stroke. Keep contact with your primary care doctor for regular follow ups.  Sleep. A good nights sleep is healthy for the brain. Seven hours is recommended. If you have insomnia or poor sleep habits we can give you some instructions. If you have sleep apnea wear your mask.  Diet: Eating a heart healthy diet is also a good idea; fish and poultry instead of red meat, nuts (mostly non-peanuts), vegetables, fruits, olive oil or canola oil (instead of butter), minimal salt (use other spices to flavor foods), whole grain rice, bread, cereal and pasta and wine in moderation.Research is now showing that the MIND diet, which is a combination of The Mediterranean diet and the DASH diet, is beneficial for cognitive processing and longevity. Information about this diet can be found in The MIND Diet, a book by Doyne Keel, MS, RDN, and online at [employer]bookDist[employer].si  Finances, Power of Attorney and Advance Directives: You should consider putting legal safeguards in place with regard to financial and medical decision making. While the spouse always has power of attorney for medical and financial issues in the absence of any form, you should consider what you want in case the spouse / caregiver is no longer around or capable of making decisions.    Return in about 3 months (around 03/02/2023).   I have spent a total of 45 minutes dedicated to this patient today, preparing to see patient, performing a medically appropriate examination and evaluation, ordering tests and/or medications and procedures, and counseling and educating the patient/family/caregiver; independently interpreting result and communicating results to the family/patient/caregiver; and documenting clinical information in the electronic medical record.   [name], MD 12/01/2022, 12:04 PM  [facility] [address],  [place], [place] [address] [phone]

## 2022-12-01 NOTE — Patient Instructions (Signed)
Increase Aricept to 10 mg nightly Start Sinemet 25/100, twice daily for the next 2 weeks then increase to 3 times daily as tolerated Referral to physical therapy for gait training Strongly advised him to start exercise Follow-up in 3 months or sooner if worse.

## 2022-12-03 NOTE — Therapy (Signed)
OUTPATIENT PHYSICAL THERAPY NEURO EVALUATION   Patient Name: Edward Briggs MRN: [id number] DOB:1951-07-15, 72 y.o., male Today's Date: 12/04/2022   PCP: [name], MD  REFERRING PROVIDER:  [name], MD  END OF SESSION:  PT End of Session - 12/04/22 0914     Visit Number 1    Number of Visits 17    Date for PT Re-Evaluation 02/02/23    Authorization Type [employer] Medicare    PT Start Time 0805    PT Stop Time 7341    PT Time Calculation (min) 42 min    Equipment Utilized During Treatment Gait belt    Activity Tolerance Patient tolerated treatment well    Behavior During Therapy WFL for tasks assessed/performed;Flat affect             Past Medical History:  Diagnosis Date   Diabetes mellitus without complication ([place])    GERD (gastroesophageal reflux disease)    no meds   Hypertension    controlled on meds   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   Past Surgical History:  Procedure Laterality Date   ABSCESS DRAINAGE     COLONOSCOPY     ~ 10 yrs ago   [place]  03/26/2012   Procedure: HARDWARE REMOVAL;  Surgeon: [name], MD;  Location: [facility];  Service: Orthopedics;  Laterality: Left;  hardware removal deep left ankle syndesmotic screws only and stress xrays ankle    ORIF ANKLE FRACTURE  12/03/2011   Procedure: OPEN REDUCTION INTERNAL FIXATION (ORIF) ANKLE FRACTURE;  Surgeon: [name], MD;  Location: [facility];  Service: Orthopedics;  Laterality: Left;  left medial malleolus fracture   PILONIDAL CYST EXCISION  ~1982   SKIN GRAFT  ~1970   Lt great toe (graft from Lt thigh)   Patient Active Problem List   Diagnosis Date Noted   HTN (hypertension) 93/79/0240   Acute metabolic encephalopathy 97/35/3299   AKI (acute kidney injury) ([place]) 07/22/2019   Sepsis ([place]) 07/22/2019   UTI (urinary tract infection) 07/22/2019   Diabetes mellitus without complication ([place]) 24/26/8341   CHEST PAIN  07/21/2009    ONSET DATE: 12/01/2022   REFERRING DIAG: G20.A1 (ICD-10-CM) - Parkinson's disease without dyskinesia, unspecified whether manifestations fluctuate   THERAPY DIAG:  Unsteadiness on feet  Other abnormalities of gait and mobility  Abnormal posture  Other symptoms and signs involving the nervous system  History of falling  Rationale for Evaluation and Treatment: Rehabilitation  SUBJECTIVE:  SUBJECTIVE STATEMENT: Saw the neurologist the other day to just get his prescription filled and was diagnosed with Parkinson's. Noticed a difference in his walking about 4-6 weeks ago. Was started on Sinemet and started taking it last night. Has been taking small, shuffled steps. Notices that he will lean forward at times. Getting up from a chair is getting more difficult. Has had 2 falls - one he was getting up and his lost balance, 2nd fall was down the stairs and missed a step.   Pt accompanied by:  Spouse   PERTINENT HISTORY: PMH: HTN, Diabetes, HLD   Pt saw neurologist [name] on 12/01/22 for memory impairment, per 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 Pt started on Sinemet   PAIN:  Are you having pain? No  PRECAUTIONS: Fall  WEIGHT BEARING RESTRICTIONS: No  FALLS: Has patient fallen in last 6 months? Yes. Number of falls 2  LIVING ENVIRONMENT: Lives with: lives with their spouse Lives in: House/apartment Stairs: Yes: Internal: 10 steps; on right going up and External: 4 steps; on right going up Has following equipment at home: None  PLOF: Independent  PATIENT GOALS: Wants to get back to normal.   OBJECTIVE:   DIAGNOSTIC FINDINGS: last brain imaging was a CT of  the head done in September 2020 and showed some cerebral atrophy some small vessel white matter disease but no acute intracranial pathology.   COGNITION: Overall cognitive status:  Reports some memory changes.    SENSATION: WFL  COORDINATION: Bradykinesia RLE>LLE    MUSCLE TONE: RLE: Rigidity   POSTURE: rounded shoulders, forward head, posterior pelvic tilt, and flexed trunk    LOWER EXTREMITY MMT:    MMT Right Eval Left Eval  Hip flexion 4+ 4  Hip extension    Hip abduction 5 5  Hip adduction 4+ 4+  Hip internal rotation    Hip external rotation    Knee flexion 5 5  Knee extension 4+ 4+  Ankle dorsiflexion 4+ 4+  Ankle plantarflexion    Ankle inversion    Ankle eversion    (Blank rows = not tested)  All tested in sitting.   BED MOBILITY:  Pt reports no difficulties with bed mobility.   TRANSFERS: Assistive device utilized: None  Sit to stand: SBA Stand to sit: SBA Can perform without UE support, decr eccentric control.   Reports when standing, feels like he wants to grab something due to his balance being off. Forward flexed posture in standing.  Pt reporting difficulty getting in and out of the living room chair.   With TUG, pt needing cues to turn all the way to chair before sitting down, as pt tendency to go to sit before reaching back and almost missing chair   GAIT: Gait pattern: step through pattern, decreased arm swing- Right, decreased arm swing- Left, decreased step length- Right, decreased step length- Left, decreased stride length, decreased ankle dorsiflexion- Right, decreased ankle dorsiflexion- Left, Right foot flat, Left foot flat, shuffling, decreased trunk rotation, and trunk flexed Distance walked: Clinic distances.  Assistive device utilized: None Level of assistance: SBA, CGA at times when turning  Comments: Pt turns en bloc and noted freezing at times when turning. Pt with shuffled gait with incr forward flexed posture and no arm swing  bilat. Pt also reports feeling some freezing at home.   FUNCTIONAL TESTS:  5 times sit to stand: 20.28  seconds without UE support 10 meter walk test: 11.07 seconds = 2.96 ft/sec   [facility] PT Assessment - 12/04/22 0830       Standardized Balance Assessment   Standardized Balance Assessment Mini-BESTest;Timed Up and Go Test      Mini-BESTest   Sit To Stand Normal: Comes to stand without use of hands and stabilizes independently.    Rise to Toes < 3 s.    Stand on one leg (left) Moderate: < 20 s   1 second   Stand on one leg (right) --   1 second   Stance - Feet together, eyes open, firm surface  Normal: 30s    Timed UP & GO with Dual Task Moderate: Dual Task affects either counting OR walking (>10%) when compared to the TUG without Dual Task.      Timed Up and Go Test   Normal TUG (seconds) 12.84    Manual TUG (seconds) 27    Cognitive TUG (seconds) 26   when cued to  count by 3s, pt counts backwards by 1s   TUG Comments Freezing noted during turns with manual/cog TUG, turns en bloc.            Will finish miniBEST at next session.    TODAY'S TREATMENT:                                                                                                                              N/A during eval.    PATIENT EDUCATION: Education details: Clinical findings, POC, information and education about PD (what it is/etiology, symptoms, importance of exercise, purpose of taking Sinemet, answered pt's questions about causes), education on taking Sinemet at the same time daily and not taking it with protein. What PT will be working on in therapy in regards to PD. Had discussion about pt also having difficulty with fine motor tasks, coordination, and buttoning. Educated on what OT is and purpose in regards to PD, pt is interested in doing this. PT to request referral from pt's neurologist.  Person educated: Patient and Spouse Education method: Explanation Education comprehension: verbalized  understanding and needs further education  HOME EXERCISE PROGRAM: Will provide at next session.  GOALS: Goals reviewed with patient? Yes  SHORT TERM GOALS: Target date: 01/01/2023  Pt will be independent with PD specific initial HEP in order to build upon functional gains made in therapy. Baseline: Goal status: INITIAL  2.  Pt will finish testing of miniBEST with STG/LTG written. Baseline:  Goal status: INITIAL  3.  Pt will verbalize understanding of fall prevention in the home.  Baseline:  Goal status: INITIAL  4.  Pt will verbalize/demonstrate understanding of freezing techniques Baseline:  Goal status: INITIAL  5.  Pt will improve manual TUG time to 21 seconds or less in order to demo decrease fall risk. Baseline: 27 seconds  Goal status: INITIAL  6.  Pt will improve 5x sit<>stand to less than or equal  to 17.5 sec to demonstrate improved functional strength and transfer efficiency.  Baseline: 20.28 seconds without UE support Goal status: INITIAL  LONG TERM GOALS: Target date: 01/29/2023  Pt will be independent with PD specific final HEP in order to build upon functional gains made in therapy. Baseline:  Goal status: INITIAL  2.  miniBEST goal to be written as appropriate.  Baseline:  Goal status: INITIAL  3.  Pt will improve 5x sit<>stand to less than or equal to 15 sec to demonstrate improved functional strength and transfer efficiency.  Baseline: 20.28 seconds without UE support Goal status: INITIAL  4.  Pt will improve gait speed with no AD to at least 3.3 ft/sec in order to demo improved community mobility.  Baseline: 11.07 seconds = 2.96 ft/sec Goal status: INITIAL  5.   Pt will improve manual TUG time to 17 seconds or less in order to demo decrease fall risk. Baseline: 27 seconds  Goal status: INITIAL  6.  Pt will verbalize understanding of community resources for PD.  Baseline:  Goal status: INITIAL  ASSESSMENT:  CLINICAL IMPRESSION: Patient is a  72 year old male referred to Neuro OPPT for PD. Pt newly diagnosed and started on Sinemet.   Pt's PMH is significant for: HTN, Diabetes, HLD. The following deficits were present during the exam: rigidity, impaired timing/coordination of gait, gait abnormalities, bradykinesia, impaired balance, decr strength, difficulties with transfers, postural abnormalities. Pt turning en bloc and having freezing episodes when trying to turn. Based on manual TUG, cog TUG, and 5x sit <> stand, pt is an incr risk for falls. Will perform further balance test at next session. Pt would benefit from skilled PT to address these impairments and functional limitations to maximize functional mobility independence to decr fall risk.    OBJECTIVE IMPAIRMENTS: Abnormal gait, decreased activity tolerance, decreased balance, decreased coordination, decreased mobility, difficulty walking, decreased strength, impaired flexibility, impaired tone, and postural dysfunction.   ACTIVITY LIMITATIONS: bending, stairs, transfers, dressing, and locomotion level  PARTICIPATION LIMITATIONS: driving and community activity  PERSONAL FACTORS: Behavior pattern, Past/current experiences, Time since onset of injury/illness/exacerbation, and 3+ comorbidities: HTN, Diabetes, HLD, newly diagnosed PD  are also affecting patient's functional outcome.   REHAB POTENTIAL: Good  CLINICAL DECISION MAKING: Evolving/moderate complexity  EVALUATION COMPLEXITY: Moderate  PLAN:  PT FREQUENCY: 2x/week  PT DURATION: 8 weeks  PLANNED INTERVENTIONS: Therapeutic exercises, Therapeutic activity, Neuromuscular re-education, Balance training, Gait training, Patient/Family education, Self Care, Stair training, Vestibular training, DME instructions, and Re-evaluation  PLAN FOR NEXT SESSION: Finish miniBEST and write STG/LTG. Initial HEP - Standing PWR moves, sit <> stands and other balance deficits found on miniBEST. Freezing strategies. Gait training with larger  amplitude movements and arm swing.    Arliss Journey, PT, DPT  12/04/2022, 9:16 AM

## 2022-12-04 ENCOUNTER — Encounter: Payer: Self-pay | Admitting: Physical Therapy

## 2022-12-04 ENCOUNTER — Ambulatory Visit: Payer: Medicare Other | Attending: Neurology | Admitting: Physical Therapy

## 2022-12-04 DIAGNOSIS — R41840 Attention and concentration deficit: Secondary | ICD-10-CM | POA: Diagnosis present

## 2022-12-04 DIAGNOSIS — R41842 Visuospatial deficit: Secondary | ICD-10-CM | POA: Insufficient documentation

## 2022-12-04 DIAGNOSIS — R29818 Other symptoms and signs involving the nervous system: Secondary | ICD-10-CM | POA: Diagnosis present

## 2022-12-04 DIAGNOSIS — R293 Abnormal posture: Secondary | ICD-10-CM | POA: Diagnosis present

## 2022-12-04 DIAGNOSIS — R2689 Other abnormalities of gait and mobility: Secondary | ICD-10-CM | POA: Diagnosis present

## 2022-12-04 DIAGNOSIS — M6281 Muscle weakness (generalized): Secondary | ICD-10-CM | POA: Insufficient documentation

## 2022-12-04 DIAGNOSIS — G20A1 Parkinson's disease without dyskinesia, without mention of fluctuations: Secondary | ICD-10-CM | POA: Diagnosis not present

## 2022-12-04 DIAGNOSIS — R2681 Unsteadiness on feet: Secondary | ICD-10-CM | POA: Insufficient documentation

## 2022-12-04 DIAGNOSIS — R278 Other lack of coordination: Secondary | ICD-10-CM | POA: Diagnosis present

## 2022-12-04 DIAGNOSIS — Z9181 History of falling: Secondary | ICD-10-CM | POA: Diagnosis present

## 2022-12-05 ENCOUNTER — Telehealth: Payer: Self-pay | Admitting: Physical Therapy

## 2022-12-05 ENCOUNTER — Other Ambulatory Visit: Payer: Self-pay | Admitting: Neurology

## 2022-12-05 DIAGNOSIS — G20A1 Parkinson's disease without dyskinesia, without mention of fluctuations: Secondary | ICD-10-CM

## 2022-12-05 NOTE — Telephone Encounter (Signed)
Done. Thank you.

## 2022-12-05 NOTE — Telephone Encounter (Signed)
[name],  Edward Briggs was evaluated by Physical Therapy on 12/04/22.  The patient would benefit from an OT evaluation for impaired coordination, bradykinesia due to PD.    If you agree, please place an order in [facility] workque in [facility] or fax the order to [phone]. Thank you, Janann August, PT, DPT 12/05/22 9:27 AM    [facility] [address] [place], [place]  [address] Phone:  [phone] Fax:  [phone]

## 2022-12-08 ENCOUNTER — Ambulatory Visit: Payer: Medicare Other | Admitting: Physical Therapy

## 2022-12-08 ENCOUNTER — Encounter: Payer: Self-pay | Admitting: Occupational Therapy

## 2022-12-08 ENCOUNTER — Ambulatory Visit: Payer: Medicare Other | Admitting: Occupational Therapy

## 2022-12-08 DIAGNOSIS — R29818 Other symptoms and signs involving the nervous system: Secondary | ICD-10-CM

## 2022-12-08 DIAGNOSIS — R2689 Other abnormalities of gait and mobility: Secondary | ICD-10-CM

## 2022-12-08 DIAGNOSIS — R41842 Visuospatial deficit: Secondary | ICD-10-CM

## 2022-12-08 DIAGNOSIS — R293 Abnormal posture: Secondary | ICD-10-CM

## 2022-12-08 DIAGNOSIS — M6281 Muscle weakness (generalized): Secondary | ICD-10-CM

## 2022-12-08 DIAGNOSIS — R2681 Unsteadiness on feet: Secondary | ICD-10-CM | POA: Diagnosis not present

## 2022-12-08 DIAGNOSIS — R41840 Attention and concentration deficit: Secondary | ICD-10-CM

## 2022-12-08 DIAGNOSIS — R278 Other lack of coordination: Secondary | ICD-10-CM

## 2022-12-08 NOTE — Therapy (Unsigned)
OUTPATIENT OCCUPATIONAL THERAPY PARKINSON'S EVALUATION  Patient Name: Edward Briggs MRN: [id number] DOB:18-Mar-1951, 71 y.o., male Today's Date: 12/08/2022  PCP: [name], MD  REFERRING PROVIDER: [name], MD   END OF SESSION:  OT End of Session - 12/08/22 1534     Visit Number 1    Number of Visits 25    Date for OT Re-Evaluation 03/06/23    Authorization Type [employer]HC Medicare    OT Start Time 1533    OT Stop Time 1615    OT Time Calculation (min) 42 min    Activity Tolerance Patient tolerated treatment well    Behavior During Therapy WFL for tasks assessed/performed;Flat affect            Past Medical History:  Diagnosis Date   Diabetes mellitus without complication ([place])    GERD (gastroesophageal reflux disease)    no meds   Hypertension    controlled on meds   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   Past Surgical History:  Procedure Laterality Date   ABSCESS DRAINAGE     COLONOSCOPY     ~ 10 yrs ago   [place]  03/26/2012   Procedure: HARDWARE REMOVAL;  Surgeon: [name], MD;  Location: [facility];  Service: Orthopedics;  Laterality: Left;  hardware removal deep left ankle syndesmotic screws only and stress xrays ankle    ORIF ANKLE FRACTURE  12/03/2011   Procedure: OPEN REDUCTION INTERNAL FIXATION (ORIF) ANKLE FRACTURE;  Surgeon: [name], MD;  Location: [facility];  Service: Orthopedics;  Laterality: Left;  left medial malleolus fracture   PILONIDAL CYST EXCISION  ~1982   SKIN GRAFT  ~1970   Lt great toe (graft from Lt thigh)   Patient Active Problem List   Diagnosis Date Noted   HTN (hypertension) 85/27/7824   Acute metabolic encephalopathy 23/53/6144   AKI (acute kidney injury) ([place]) 07/22/2019   Sepsis ([place]) 07/22/2019   UTI (urinary tract infection) 07/22/2019   Diabetes mellitus without complication ([place]) 31/54/0086   CHEST PAIN 07/21/2009    ONSET DATE:  12/01/2022  REFERRING DIAG: G20.A1 (ICD-10-CM) - Parkinson's disease without dyskinesia, unspecified whether manifestations fluctuate   THERAPY DIAG:  Other lack of coordination  Muscle weakness (generalized)  Visuospatial deficit  Attention and concentration deficit  Other symptoms and signs involving the nervous system  Rationale for Evaluation and Treatment: Rehabilitation  SUBJECTIVE:   SUBJECTIVE STATEMENT: Pt states he mostly has difficulty with buttons, handwriting, and donning jackets. 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    Pt accompanied by: significant other - [name]  PERTINENT HISTORY:   Per PT eval, " PMH: HTN, Diabetes, HLD    Pt saw neurologist [name] on 12/01/22 for memory impairment, per 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 Pt started on Sinemet.  Noticed a difference in his walking about 4-6 weeks ago. Was started on Sinemet and started taking it (1/31). Has been taking small, shuffled steps. Notices that he will lean forward at times. Getting up from a chair is getting more difficult. Has had 2 falls - one he was getting up and his lost balance, 2nd fall was down the stairs and missed a step."    PRECAUTIONS: Fall  WEIGHT BEARING RESTRICTIONS: No  PAIN:  Are you having  pain? No  FALLS: Has patient fallen in last 6 months? Yes. Number of falls 2  LIVING ENVIRONMENT: Lives with: lives with their spouse Lives in: House/apartment Stairs: Yes: Internal: 10 steps; on right going up and External: 4 steps; on right going up Has following equipment at home: None   PLOF: Independent   PATIENT GOALS: Wants to get back to normal.   OBJECTIVE:   HAND DOMINANCE: Right  ADLs: Transfers/ambulation related to ADLs: Eating: mod I Grooming: I UB Dressing: min A LB Dressing: mod A Toileting: min A with  buttoning pants Bathing: Distance supervision  Tub Shower transfers: distance supervision Equipment: Careers information officer [place]  IADLs: Shopping: same as PLOF Light housekeeping: mod I Meal Prep: light meal prep, has only been cooking breakfast Community mobility: driving, short distance, no highway Medication management: min A Financial management: I Handwriting: 25% legible  MOBILITY STATUS: Needs Assist: SBA  ACTIVITY TOLERANCE: Activity tolerance: good  FUNCTIONAL OUTCOME MEASURES: Fastening/unfastening 3 buttons: 1:55  Physical performance test: PPT#2 (simulated eating) 27 seconds R = 1 & PPT#4 (donning/doffing jacket): 27 seconds = 1  COORDINATION: 9 Hole Peg test: Right: 55 sec; Left: 42 sec Box and Blocks:  Right 26 blocks, Left 31blocks  Grip strength: 73.4 lbs R ; 74.5 lbs L  UE ROM:  WFL  UE MMT:   WFL  SENSATION: WFL  MUSCLE TONE: RUE: Moderate more prominent in shoulder and LUE: Mild more prominent in elbow  COGNITION: Overall cognitive status: Impaired - impaired motor planning, processing,  OBSERVATIONS: Bradykinesia and Pill rolling L hand;  Difficulty following commands  TODAY'S TREATMENT:                                                                                                                               N/a  PATIENT EDUCATION: Education details: OT POC; Role of OT Person educated: Patient and Spouse Education method: Customer service manager Education comprehension: verbalized understanding, returned demonstration, and needs further education  HOME EXERCISE PROGRAM: N/a  GOALS:  SHORT TERM GOALS: Target date: 01/05/2023    Pt will be independent with PD specific HEP.  Baseline: not yet initiated Goal status: INITIAL  2.  Pt will verbalize understanding of adapted strategies to maximize safety and independence with ADLs/IADLs.  Baseline: not yet initiated Goal status: INITIAL  3.  Pt will write a sentence with no significant  decrease in size and maintain 75% legibility.  Baseline: 25% legibility Goal status: INITIAL  4.  Pt will demonstrate improved ease with fastening buttons as evidenced by decreasing 3 button/unbutton time by 5 seconds  Baseline: 1 minute 55 seconds Goal status: INITIAL    LONG TERM GOALS: Target date: 03/06/2023    Pt will verbalize understanding of ways to prevent future PD related complications and PD community resources.  Baseline: not yet initiated Goal status: INITIAL  2.  Pt will write a short paragraph with no significant decrease in size and maintain  100% legibility.  Baseline: 25% legibility Goal status: INITIAL  3.  Pt will verbalize understanding of ways to keep thinking skills sharp and ways to compensate for STM changes in the future.  Baseline: not yet initiated Goal status: INITIAL  4.  Pt will demonstrate improved ease with feeding as evidenced by decreasing PPT#2 by 3 secs.  Baseline: 27 secs Goal status: INITIAL  5.  Pt will demonstrate increased ease with dressing as evidenced by decreasing PPT#4 (don/ doff jacket) to 24 secs or less.  Baseline: 27 secs Goal status: INITIAL  6.  Pt will demonstrate improved fine motor coordination for ADLs as evidenced by decreasing 9 hole peg test score for each hand by 5 secs  Baseline: Right: 55 sec; Left: 42 sec  Goal status: INITIAL   7.  Pt will demonstrate improved ease with fastening buttons as evidenced by decreasing 3 button/unbutton time by 10 seconds  Baseline: 1 minute 55 seconds seconds Goal status: INITIAL  8.  Pt will be able to place at least 29 blocks using R hand with completion of Box and Blocks test.  Baseline: 26 blocks Goal status: INITIAL    ASSESSMENT:  CLINICAL IMPRESSION: Patient is a 72 y.o. male who was seen today for occupational therapy evaluation for PD. Hx includes HTN, HDL, DM. Patient currently presents below baseline level of functioning demonstrating functional deficits  and impairments as noted below. Pt would benefit from skilled OT services in the outpatient setting to work on impairments as noted below to help pt return to PLOF as able.    PERFORMANCE DEFICITS: in functional skills including ADLs, IADLs, coordination, tone, Fine motor control, Gross motor control, mobility, balance, body mechanics, and UE functional use, cognitive skills including attention, perception, problem solving, sequencing, and understand  IMPAIRMENTS: are limiting patient from ADLs, IADLs, and leisure.   COMORBIDITIES:  has co-morbidities such as HTN, HLD, DM  that affects occupational performance. Patient will benefit from skilled OT to address above impairments and improve overall function.  MODIFICATION OR ASSISTANCE TO COMPLETE EVALUATION: Min-Moderate modification of tasks or assist with assess necessary to complete an evaluation.  OT OCCUPATIONAL PROFILE AND HISTORY: Detailed assessment: Review of records and additional review of physical, cognitive, psychosocial history related to current functional performance.  CLINICAL DECISION MAKING: Moderate - several treatment options, min-mod task modification necessary  REHAB POTENTIAL: Fair given PD diagnosis  EVALUATION COMPLEXITY: Moderate    PLAN:  OT FREQUENCY: 2x/week  OT DURATION: 12 weeks  PLANNED INTERVENTIONS: self care/ADL training, therapeutic exercise, therapeutic activity, neuromuscular re-education, manual therapy, functional mobility training, ultrasound, paraffin, fluidotherapy, moist heat, patient/family education, cognitive remediation/compensation, visual/perceptual remediation/compensation, energy conservation, coping strategies training, DME and/or AE instructions, and Re-evaluation  RECOMMENDED OTHER SERVICES: none at this time  CONSULTED AND AGREED WITH PLAN OF CARE: Patient and family member/caregiver  PLAN FOR NEXT SESSION: initiate HEP; ADL strategies   Dennis Bast, OT 12/08/2022, 4:43 PM

## 2022-12-08 NOTE — Therapy (Signed)
OUTPATIENT PHYSICAL THERAPY NEURO TREATMENT   Patient Name: Edward Briggs MRN: [id number] DOB:Aug 16, 1951, 72 y.o., male Today's Date: 12/08/2022   PCP: [name], MD  REFERRING PROVIDER:  [name], MD  END OF SESSION:  PT End of Session - 12/08/22 1451     Visit Number 2    Number of Visits 17    Date for PT Re-Evaluation 02/02/23    Authorization Type [employer] Medicare    PT Start Time 1450    PT Stop Time 1532    PT Time Calculation (min) 42 min    Equipment Utilized During Treatment Gait belt    Activity Tolerance Patient tolerated treatment well    Behavior During Therapy WFL for tasks assessed/performed;Flat affect              Past Medical History:  Diagnosis Date   Diabetes mellitus without complication ([place])    GERD (gastroesophageal reflux disease)    no meds   Hypertension    controlled on meds   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   Past Surgical History:  Procedure Laterality Date   ABSCESS DRAINAGE     COLONOSCOPY     ~ 10 yrs ago   [place]  03/26/2012   Procedure: HARDWARE REMOVAL;  Surgeon: [name], MD;  Location: [facility];  Service: Orthopedics;  Laterality: Left;  hardware removal deep left ankle syndesmotic screws only and stress xrays ankle    ORIF ANKLE FRACTURE  12/03/2011   Procedure: OPEN REDUCTION INTERNAL FIXATION (ORIF) ANKLE FRACTURE;  Surgeon: [name], MD;  Location: [facility];  Service: Orthopedics;  Laterality: Left;  left medial malleolus fracture   PILONIDAL CYST EXCISION  ~1982   SKIN GRAFT  ~1970   Lt great toe (graft from Lt thigh)   Patient Active Problem List   Diagnosis Date Noted   HTN (hypertension) 02/58/5277   Acute metabolic encephalopathy 82/42/3536   AKI (acute kidney injury) ([place]) 07/22/2019   Sepsis ([place]) 07/22/2019   UTI (urinary tract infection) 07/22/2019   Diabetes mellitus without complication ([place]) 14/43/1540   CHEST PAIN  07/21/2009    ONSET DATE: 12/01/2022   REFERRING DIAG: G20.A1 (ICD-10-CM) - Parkinson's disease without dyskinesia, unspecified whether manifestations fluctuate   THERAPY DIAG:  Unsteadiness on feet  Other abnormalities of gait and mobility  Abnormal posture  Rationale for Evaluation and Treatment: Rehabilitation  SUBJECTIVE:  SUBJECTIVE STATEMENT: Pt reports doing well. No changes since last visit. Wife provided majority of subjective as pt unable to provide accurate answers at times. Is taking Sinemet 3x/day, at 11p, 5p and late at night. Has not noticed a major change yet.   Pt accompanied by:  Spouse, Edward Briggs    PERTINENT HISTORY: PMH: HTN, Diabetes, HLD   Pt saw neurologist [name] on 12/01/22 for memory impairment, per 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 Pt started on Sinemet   PAIN:  Are you having pain? No  PRECAUTIONS: Fall  WEIGHT BEARING RESTRICTIONS: No  FALLS: Has patient fallen in last 6 months? Yes. Number of falls 2  LIVING ENVIRONMENT: Lives with: lives with their spouse Lives in: House/apartment Stairs: Yes: Internal: 10 steps; on right going up and External: 4 steps; on right going up Has following equipment at home: None  PLOF: Independent  PATIENT GOALS: Wants to get back to normal.   OBJECTIVE:   DIAGNOSTIC FINDINGS: last brain imaging was a CT of the head done in September 2020 and showed some cerebral atrophy some small vessel white matter disease but no acute intracranial pathology.   COGNITION: Overall cognitive status:  Reports some memory changes.    SENSATION: WFL  COORDINATION: Bradykinesia RLE>LLE    MUSCLE TONE: RLE:  Rigidity   POSTURE: rounded shoulders, forward head, posterior pelvic tilt, and flexed trunk    LOWER EXTREMITY MMT:    MMT Right Eval Left Eval  Hip flexion 4+ 4  Hip extension    Hip abduction 5 5  Hip adduction 4+ 4+  Hip internal rotation    Hip external rotation    Knee flexion 5 5  Knee extension 4+ 4+  Ankle dorsiflexion 4+ 4+  Ankle plantarflexion    Ankle inversion    Ankle eversion    (Blank rows = not tested)  All tested in sitting.  TODAY'S TREATMENT:      Ther Act    [facility] PT Assessment - 12/08/22 1456       Mini-BESTest   Sit To Stand Normal: Comes to stand without use of hands and stabilizes independently.    Rise to Toes < 3 s.    Stand on one leg (left) Moderate: < 20 s   1s   Stand on one leg (right) Moderate: < 20 s   1s   Stand on one leg - lowest score 1    Compensatory Stepping Correction - Forward No step, OR would fall if not caught, OR falls spontaneously.    Compensatory Stepping Correction - Backward No step, OR would fall if not caught, OR falls spontaneously.    Compensatory Stepping Correction - Left Lateral Severe: Falls, or cannot step   Unable to facilitate step   Compensatory Stepping Correction - Right Lateral Severe:  Falls, or cannot step   Unable to facilitate step   Stepping Corredtion Lateral - lowest score 0    Stance - Feet together, eyes open, firm surface  Normal: 30s    Stance - Feet together, eyes closed, foam surface  Moderate: < 30s   15s   Incline - Eyes Closed Moderate: Stands independently < 30s OR aligns with surface   Significant forward lean   Change in Gait Speed Severe: Unable to achieve significant change in walking speed AND signs of imbalance.    Walk with head turns - Horizontal  Normal: performs head turns with no change in gait speed and good balance    Walk with pivot turns Moderate:Turns with feet close SLOW (>4 steps) with good balance.    Step over obstacles Moderate: Steps over box but touches box OR  displays cautious behavior by slowing gait.   Significant stumble w/LLE   Timed UP & GO with Dual Task Moderate: Dual Task affects either counting OR walking (>10%) when compared to the TUG without Dual Task.    Mini-BEST total score 12            NMR  Established initial HEP (see bolded below) for improved anterior weight shifting and transfers. Pt initially demonstrating significant retropulsion w/sit <>stand due to poor body mechanics and improper foot placement. Provided max cues for pt to scoot hips to edge of mat, place feet hip width apart and tucked under his knees and for "nose over toes". Pt performed 10 reps w/cues and noted pt rotating to R side and tucking R foot underneath him as he stood. Educated pt and wife on cues to use to ensure equal weight shift on BLEs, wife verbalized understanding.    GAIT: Gait pattern: step through pattern, decreased arm swing- Right, decreased arm swing- Left, decreased step length- Right, decreased step length- Left, decreased stride length, decreased ankle dorsiflexion- Right, decreased ankle dorsiflexion- Left, Right foot flat, Left foot flat, shuffling, decreased trunk rotation, and trunk flexed Distance walked: Clinic distances.  Assistive device utilized: None Level of assistance: SBA, CGA at times when turning  Comments: Pt turns en bloc and noted freezing at times when turning. Pt with shuffled gait with incr forward flexed posture and no arm swing bilat.     PATIENT EDUCATION: Education details: MiniBest results, initial HEP  Person educated: Patient and Spouse Education method: Explanation, Demonstration, Tactile cues, Verbal cues, and Handouts Education comprehension: verbalized understanding and needs further education  HOME EXERCISE PROGRAM: Access Code: [phone] URL: [email or web address] Date: 12/08/2022 Prepared by: Mickie Bail Shemia Bevel  Exercises - Sit to Stand Without Arm Support  - 1 x daily - 7 x weekly - 3  sets - 10 reps  GOALS: Goals reviewed with patient? Yes  SHORT TERM GOALS: Target date: 01/01/2023  Pt will be independent with PD specific initial HEP in order to build upon functional gains made in therapy. Baseline: Goal status: INITIAL  2.  Pt will improve MiniBest to 15/28 for decreased fall risk and improvement with compensatory stepping strategies.   Baseline: 12/28 on 2/5 Goal status: REVISED  3.  Pt will verbalize understanding of fall prevention in the home.  Baseline:  Goal status: INITIAL  4.  Pt will verbalize/demonstrate understanding of freezing techniques Baseline:  Goal status: INITIAL  5.  Pt will improve manual TUG time to 21 seconds or less in order to demo decrease fall risk. Baseline: 27 seconds  Goal status: INITIAL  6.  Pt will improve 5x sit<>stand to less than or equal to 17.5 sec to demonstrate improved functional strength and transfer efficiency.  Baseline: 20.28 seconds without UE support Goal status: INITIAL  LONG TERM GOALS: Target date: 01/29/2023  Pt will be independent with PD specific final HEP in order to build upon functional gains made in therapy. Baseline:  Goal status: INITIAL  2.  Pt will improve MiniBest to 18/28 for decreased fall risk and improvement with compensatory stepping strategies.   Baseline: 12/28  Goal status: REVISED  3.  Pt will improve 5x sit<>stand to less than or  equal to 15 sec to demonstrate improved functional strength and transfer efficiency.  Baseline: 20.28 seconds without UE support Goal status: INITIAL  4.  Pt will improve gait speed with no AD to at least 3.3 ft/sec in order to demo improved community mobility.  Baseline: 11.07 seconds = 2.96 ft/sec Goal status: INITIAL  5.   Pt will improve manual TUG time to 17 seconds or less in order to demo decrease fall risk. Baseline: 27 seconds  Goal status: INITIAL  6.  Pt will verbalize understanding of community resources for PD.  Baseline:  Goal  status: INITIAL  ASSESSMENT:  CLINICAL IMPRESSION: Emphasis of skilled PT session on balance assessment and establishing initial HEP. Pt scored a 12/28 on MiniBest, indicative of high fall risk. Pt had significant difficulty w/eliciting stepping strategies and dynamic gait tasks. Pt also unable to stand w/EC or on unlevel surfaces. Noted pt shifting to RLE during sit <>stands and minor retropulsion. Provided max cues for proper technique but pt has difficulty understanding cues. Will continue to work on turns, stepping strategies and freezing techniques in future sessions. Continue POC.    OBJECTIVE IMPAIRMENTS: Abnormal gait, decreased activity tolerance, decreased balance, decreased coordination, decreased mobility, difficulty walking, decreased strength, impaired flexibility, impaired tone, and postural dysfunction.   ACTIVITY LIMITATIONS: bending, stairs, transfers, dressing, and locomotion level  PARTICIPATION LIMITATIONS: driving and community activity  PERSONAL FACTORS: Behavior pattern, Past/current experiences, Time since onset of injury/illness/exacerbation, and 3+ comorbidities: HTN, Diabetes, HLD, newly diagnosed PD  are also affecting patient's functional outcome.   REHAB POTENTIAL: Good  CLINICAL DECISION MAKING: Evolving/moderate complexity  EVALUATION COMPLEXITY: Moderate  PLAN:  PT FREQUENCY: 2x/week  PT DURATION: 8 weeks  PLANNED INTERVENTIONS: Therapeutic exercises, Therapeutic activity, Neuromuscular re-education, Balance training, Gait training, Patient/Family education, Self Care, Stair training, Vestibular training, DME instructions, and Re-evaluation  PLAN FOR NEXT SESSION: Add to Initial HEP - Standing PWR moves, sit <> stands and other balance deficits found on miniBEST. Freezing strategies. Gait training with larger amplitude movements and arm swing. Turns, lateral weight shifting, unlevel surfaces    Vernella Niznik E Erienne Spelman, PT, DPT  12/08/2022, 3:35  PM

## 2022-12-12 ENCOUNTER — Ambulatory Visit: Payer: Medicare Other | Admitting: Physical Therapy

## 2022-12-12 DIAGNOSIS — R2681 Unsteadiness on feet: Secondary | ICD-10-CM

## 2022-12-12 DIAGNOSIS — R278 Other lack of coordination: Secondary | ICD-10-CM

## 2022-12-12 DIAGNOSIS — M6281 Muscle weakness (generalized): Secondary | ICD-10-CM

## 2022-12-12 NOTE — Therapy (Signed)
OUTPATIENT PHYSICAL THERAPY NEURO TREATMENT   Patient Name: Edward Briggs MRN: [id number] DOB:Oct 06, 1951, 72 y.o., male Today's Date: 12/12/2022   PCP: [name], MD  REFERRING PROVIDER:  [name], MD  END OF SESSION:  PT End of Session - 12/12/22 1451     Visit Number 3    Number of Visits 17    Date for PT Re-Evaluation 02/02/23    Authorization Type [employer] Medicare    PT Start Time 1450   Pt arrived late   PT Stop Time 1531    PT Time Calculation (min) 41 min    Equipment Utilized During Treatment Gait belt    Activity Tolerance Patient tolerated treatment well    Behavior During Therapy WFL for tasks assessed/performed               Past Medical History:  Diagnosis Date   Diabetes mellitus without complication ([place])    GERD (gastroesophageal reflux disease)    no meds   Hypertension    controlled on meds   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   Past Surgical History:  Procedure Laterality Date   ABSCESS DRAINAGE     COLONOSCOPY     ~ 10 yrs ago   [place]  03/26/2012   Procedure: HARDWARE REMOVAL;  Surgeon: [name], MD;  Location: [facility];  Service: Orthopedics;  Laterality: Left;  hardware removal deep left ankle syndesmotic screws only and stress xrays ankle    ORIF ANKLE FRACTURE  12/03/2011   Procedure: OPEN REDUCTION INTERNAL FIXATION (ORIF) ANKLE FRACTURE;  Surgeon: [name], MD;  Location: [facility];  Service: Orthopedics;  Laterality: Left;  left medial malleolus fracture   PILONIDAL CYST EXCISION  ~1982   SKIN GRAFT  ~1970   Lt great toe (graft from Lt thigh)   Patient Active Problem List   Diagnosis Date Noted   HTN (hypertension) 123456   Acute metabolic encephalopathy 123456   AKI (acute kidney injury) ([place]) 07/22/2019   Sepsis ([place]) 07/22/2019   UTI (urinary tract infection) 07/22/2019   Diabetes mellitus without complication ([place]) 123XX123   CHEST  PAIN 07/21/2009    ONSET DATE: 12/01/2022   REFERRING DIAG: G20.A1 (ICD-10-CM) - Parkinson's disease without dyskinesia, unspecified whether manifestations fluctuate   THERAPY DIAG:  Unsteadiness on feet  Muscle weakness (generalized)  Other lack of coordination  Rationale for Evaluation and Treatment: Rehabilitation  SUBJECTIVE:  SUBJECTIVE STATEMENT: Pt reports doing well. Sit <>Stands are still challenging but getting better. No falls   Pt accompanied by:  Spouse, Edward Briggs    PERTINENT HISTORY: PMH: HTN, Diabetes, HLD   Pt saw neurologist [name] on 12/01/22 for memory impairment, per 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 Pt started on Sinemet   PAIN:  Are you having pain? No  PRECAUTIONS: Fall  WEIGHT BEARING RESTRICTIONS: No  FALLS: Has patient fallen in last 6 months? Yes. Number of falls 2  LIVING ENVIRONMENT: Lives with: lives with their spouse Lives in: House/apartment Stairs: Yes: Internal: 10 steps; on right going up and External: 4 steps; on right going up Has following equipment at home: None  PLOF: Independent  PATIENT GOALS: Wants to get back to normal.   OBJECTIVE:   DIAGNOSTIC FINDINGS: last brain imaging was a CT of the head done in September 2020 and showed some cerebral atrophy some small vessel white matter disease but no acute intracranial pathology.   COGNITION: Overall cognitive status:  Reports some memory changes.    SENSATION: WFL  COORDINATION: Bradykinesia RLE>LLE    MUSCLE TONE: RLE: Rigidity   POSTURE: rounded shoulders, forward head, posterior pelvic tilt, and flexed trunk    LOWER EXTREMITY MMT:    MMT Right Eval  Left Eval  Hip flexion 4+ 4  Hip extension    Hip abduction 5 5  Hip adduction 4+ 4+  Hip internal rotation    Hip external rotation    Knee flexion 5 5  Knee extension 4+ 4+  Ankle dorsiflexion 4+ 4+  Ankle plantarflexion    Ankle inversion    Ankle eversion    (Blank rows = not tested)  All tested in sitting.  TODAY'S TREATMENT:      Ther Ex SciFit multi-peaks level 6 for 8 minutes using BUE/BLEs for neural priming for reciprocal movement, dynamic cardiovascular warmup and increased amplitude of stepping. Min cues to maintain steps/min >65. Noted L truncal lean throughout exercise, min cues for midline orientation as pt not aware of lean. RPE of 8/10 following activity    NMR  Pt performed 5 sit <>stands without cues for proper form and demonstrated good carryover from previous session with no retropulsion noted.  Added to HEP (see bolded below) for improved lateral weight shifting, truncal rotation and UE/LE coordination: Standing Reach and clap to Opposite Side with Weight Shift, x8 per side. Performed with mat behind pt and a chair in front for safety. Max multimodal cues to maintain open hands, high amplitude clap and to maintain gaze on moving hand to facilitate further rotation. Pt initially unable to coordinate UE movement but with practice, was able to perform w/bradykinetic rotation and no clap, just hands together.   Seated Windmill Trunk Rotation Stretch, x8 per side. Mod cues to maintain open hands w/palms facing forward and to look up and hand pointed towards ceiling for improved stretch.  Step sideways with arms reaching at counter, x10 reps per side. Max verbal cues for LARGE lateral step and to reach up to post-it placed on cabinet superolaterally. After 3 reps per side, placed colored dots on floor as target to step towards as pt unable to elicit large step (decreased on L side > R side). Pt did improve w/use of visual cues but forgot to reach towards post-it, requiring  mod concurrent cues to maintain lateral reach. CGA  throughout for safety    GAIT: Gait pattern: step through pattern, decreased arm swing- Right, decreased arm swing- Left, decreased step length- Right, decreased step length- Left, decreased stride length, decreased ankle dorsiflexion- Right, decreased ankle dorsiflexion- Left, Right foot flat, Left foot flat, shuffling, decreased trunk rotation, and trunk flexed Distance walked: Clinic distances.  Assistive device utilized: None Level of assistance: SBA, CGA at times when turning  Comments: Pt turns en bloc and noted freezing at times when turning. Pt with shuffled gait with incr forward flexed posture and no arm swing bilat. Pt maintains L hand in fist     PATIENT EDUCATION: Education details: Additions to HEP Person educated: Patient and Spouse Education method: Explanation, Demonstration, Tactile cues, Verbal cues, and Handouts Education comprehension: verbalized understanding and needs further education  HOME EXERCISE PROGRAM: Access Code: [email or web address] URL: [email or web address] Date: 12/08/2022 Prepared by: Mickie Bail Shamica Moree  Exercises - Sit to Stand Without Arm Support  - 1 x daily - 7 x weekly - 3 sets - 10 reps - Standing Reach to Opposite Side with Weight Shift  - 1 x daily - 7 x weekly - 3 sets - 10 reps - Seated Windmill Trunk Rotation Stretch  - 1 x daily - 7 x weekly - 3 sets - 10 reps - Step sideways with arms reaching at counter   - 1 x daily - 7 x weekly - 3 sets - 10 reps  GOALS: Goals reviewed with patient? Yes  SHORT TERM GOALS: Target date: 01/01/2023  Pt will be independent with PD specific initial HEP in order to build upon functional gains made in therapy. Baseline: Goal status: INITIAL  2.  Pt will improve MiniBest to 15/28 for decreased fall risk and improvement with compensatory stepping strategies.   Baseline: 12/28 on 2/5 Goal status: REVISED  3.  Pt will verbalize understanding of fall  prevention in the home.  Baseline:  Goal status: INITIAL  4.  Pt will verbalize/demonstrate understanding of freezing techniques Baseline:  Goal status: INITIAL  5.  Pt will improve manual TUG time to 21 seconds or less in order to demo decrease fall risk. Baseline: 27 seconds  Goal status: INITIAL  6.  Pt will improve 5x sit<>stand to less than or equal to 17.5 sec to demonstrate improved functional strength and transfer efficiency.  Baseline: 20.28 seconds without UE support Goal status: INITIAL  LONG TERM GOALS: Target date: 01/29/2023  Pt will be independent with PD specific final HEP in order to build upon functional gains made in therapy. Baseline:  Goal status: INITIAL  2.  Pt will improve MiniBest to 18/28 for decreased fall risk and improvement with compensatory stepping strategies.   Baseline: 12/28  Goal status: REVISED  3.  Pt will improve 5x sit<>stand to less than or equal to 15 sec to demonstrate improved functional strength and transfer efficiency.  Baseline: 20.28 seconds without UE support Goal status: INITIAL  4.  Pt will improve gait speed with no AD to at least 3.3 ft/sec in order to demo improved community mobility.  Baseline: 11.07 seconds = 2.96 ft/sec Goal status: INITIAL  5.   Pt will improve manual TUG time to 17 seconds or less in order to demo decrease fall risk. Baseline: 27 seconds  Goal status: INITIAL  6.  Pt will verbalize understanding of community resources for PD.  Baseline:  Goal status: INITIAL  ASSESSMENT:  CLINICAL IMPRESSION: Emphasis of skilled PT session on adding to HEP to facilitate lateral weight shifting,  truncal rotation and UE/LE coordination. Pt very challenged by new exercises and has better response to use of visual cues vs verbal or tactile cues. Pt unable to elicit large step w/LLE and demonstrated significant L truncal lean on SciFit that pt seemingly unaware of. Pt has improved sit <>stands significantly, with no  incidence of retropulsion today. Continue POC.    OBJECTIVE IMPAIRMENTS: Abnormal gait, decreased activity tolerance, decreased balance, decreased coordination, decreased mobility, difficulty walking, decreased strength, impaired flexibility, impaired tone, and postural dysfunction.   ACTIVITY LIMITATIONS: bending, stairs, transfers, dressing, and locomotion level  PARTICIPATION LIMITATIONS: driving and community activity  PERSONAL FACTORS: Behavior pattern, Past/current experiences, Time since onset of injury/illness/exacerbation, and 3+ comorbidities: HTN, Diabetes, HLD, newly diagnosed PD  are also affecting patient's functional outcome.   REHAB POTENTIAL: Good  CLINICAL DECISION MAKING: Evolving/moderate complexity  EVALUATION COMPLEXITY: Moderate  PLAN:  PT FREQUENCY: 2x/week  PT DURATION: 8 weeks  PLANNED INTERVENTIONS: Therapeutic exercises, Therapeutic activity, Neuromuscular re-education, Balance training, Gait training, Patient/Family education, Self Care, Stair training, Vestibular training, DME instructions, and Re-evaluation  PLAN FOR NEXT SESSION: Add to Initial HEP - Standing PWR moves, sit <> stands and other balance deficits found on miniBEST. Freezing strategies. Gait training with larger amplitude movements and arm swing. Turns, lateral weight shifting, unlevel surfaces    Kiley Torrence E Jamarl Pew, PT, DPT  12/12/2022, 3:32 PM

## 2022-12-15 ENCOUNTER — Ambulatory Visit: Payer: Medicare Other | Admitting: Physical Therapy

## 2022-12-15 DIAGNOSIS — M6281 Muscle weakness (generalized): Secondary | ICD-10-CM

## 2022-12-15 DIAGNOSIS — R2689 Other abnormalities of gait and mobility: Secondary | ICD-10-CM

## 2022-12-15 DIAGNOSIS — R2681 Unsteadiness on feet: Secondary | ICD-10-CM

## 2022-12-15 NOTE — Therapy (Signed)
OUTPATIENT PHYSICAL THERAPY NEURO TREATMENT   Patient Name: Edward Briggs MRN: [id number] DOB:11-01-1951, 72 y.o., male Today's Date: 12/15/2022   PCP: [name], MD  REFERRING PROVIDER:  [name], MD  END OF SESSION:  PT End of Session - 12/15/22 1537     Visit Number 4    Number of Visits 17    Date for PT Re-Evaluation 02/02/23    Authorization Type [employer] Medicare    PT Start Time [id number]    PT Stop Time 1615    PT Time Calculation (min) 40 min    Equipment Utilized During Treatment Gait belt    Activity Tolerance Patient tolerated treatment well    Behavior During Therapy WFL for tasks assessed/performed                Past Medical History:  Diagnosis Date   Diabetes mellitus without complication ([place])    GERD (gastroesophageal reflux disease)    no meds   Hypertension    controlled on meds   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   Past Surgical History:  Procedure Laterality Date   ABSCESS DRAINAGE     COLONOSCOPY     ~ 10 yrs ago   [place]  03/26/2012   Procedure: HARDWARE REMOVAL;  Surgeon: [name], MD;  Location: [facility];  Service: Orthopedics;  Laterality: Left;  hardware removal deep left ankle syndesmotic screws only and stress xrays ankle    ORIF ANKLE FRACTURE  12/03/2011   Procedure: OPEN REDUCTION INTERNAL FIXATION (ORIF) ANKLE FRACTURE;  Surgeon: [name], MD;  Location: [facility];  Service: Orthopedics;  Laterality: Left;  left medial malleolus fracture   PILONIDAL CYST EXCISION  ~1982   SKIN GRAFT  ~1970   Lt great toe (graft from Lt thigh)   Patient Active Problem List   Diagnosis Date Noted   HTN (hypertension) 123456   Acute metabolic encephalopathy 123456   AKI (acute kidney injury) ([place]) 07/22/2019   Sepsis ([place]) 07/22/2019   UTI (urinary tract infection) 07/22/2019   Diabetes mellitus without complication ([place]) 123XX123   CHEST PAIN  07/21/2009    ONSET DATE: 12/01/2022   REFERRING DIAG: G20.A1 (ICD-10-CM) - Parkinson's disease without dyskinesia, unspecified whether manifestations fluctuate   THERAPY DIAG:  Unsteadiness on feet  Other abnormalities of gait and mobility  Muscle weakness (generalized)  Rationale for Evaluation and Treatment: Rehabilitation  SUBJECTIVE:  SUBJECTIVE STATEMENT: Pt reports doing well. Had some difficulty w/reaching exercise at home, "I do not have the same set up that you have here". No falls   Pt accompanied by:  Spouse, [name]    PERTINENT HISTORY: PMH: HTN, Diabetes, HLD   Pt saw neurologist [name] on 12/01/22 for memory impairment, per 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 Pt started on Sinemet   PAIN:  Are you having pain? No  PRECAUTIONS: Fall  WEIGHT BEARING RESTRICTIONS: No  FALLS: Has patient fallen in last 6 months? Yes. Number of falls 2  LIVING ENVIRONMENT: Lives with: lives with their spouse Lives in: House/apartment Stairs: Yes: Internal: 10 steps; on right going up and External: 4 steps; on right going up Has following equipment at home: None  PLOF: Independent  PATIENT GOALS: Wants to get back to normal.   OBJECTIVE:   DIAGNOSTIC FINDINGS: last brain imaging was a CT of the head done in September 2020 and showed some cerebral atrophy some small vessel white matter disease but no acute intracranial pathology.   COGNITION: Overall cognitive status:  Reports some memory changes.    SENSATION: WFL  COORDINATION: Bradykinesia RLE>LLE    MUSCLE TONE: RLE: Rigidity   POSTURE: rounded shoulders, forward head, posterior pelvic tilt, and flexed  trunk    LOWER EXTREMITY MMT:    MMT Right Eval Left Eval  Hip flexion 4+ 4  Hip extension    Hip abduction 5 5  Hip adduction 4+ 4+  Hip internal rotation    Hip external rotation    Knee flexion 5 5  Knee extension 4+ 4+  Ankle dorsiflexion 4+ 4+  Ankle plantarflexion    Ankle inversion    Ankle eversion    (Blank rows = not tested)  All tested in sitting.  TODAY'S TREATMENT:      NMR  Seated Windmill Trunk Rotation Stretch, x10 per side, for improved thoracic mobility and dynamic warmup. Min cues to maintain open hands w/palms facing forward, look up at hand pointed towards ceiling for improved stretch and reach hand all the way to opposite foot.  Forward step to target w/contralateral UE scarf throw , x15 per side for improved UE/LE coordination and facilitation of arm swing. Pt very challenged by this and required max multimodal cues for proper sequencing. Pt frequently throwing scarf towards ground rather than towards ceiling, requiring mod cues and mod A to prevent reaching down towards ground to grab scarf and lose balance. Pt unable to coordinate stepping w/RLE and throwing w/LUE despite max cues, frequently switching hands mid-throw to step/throw w/RUE.   GAIT: Gait pattern: step through pattern, decreased arm swing- Right, decreased arm swing- Left, decreased step length- Right, decreased step length- Left, decreased stride length, decreased ankle dorsiflexion- Right, decreased ankle dorsiflexion- Left, Right foot flat, Left foot flat, shuffling, decreased trunk rotation, and trunk flexed Distance walked: 25' x2 plus various clinic distances  Assistive device utilized: None Level of assistance: SBA  Comments: Pt first ambulated 3 laps around gym w/mod multimodal concurrent cues for exaggerated arm swing. Pt able to perform for ~100' but then quickly regressed to absent arm swing on L side despite cues. Progressed to ambulating 345' while holding handle of trekking poles  w/therapist holding other end to promote arm swing. Pt did very well w/this and his gait speed significantly improved. Pt did have minor anterior LOB on  final lap w/poles but was able to recover independently. Pt did report back pain following activity    Ther Ex  Seated large theraball roll outs for low back pain modulation and improved spinal mobility, x10 reps     PATIENT EDUCATION: Education details: Continue HEP, practicing arm swing w/gait at home Person educated: Patient and Spouse Education method: Explanation, Demonstration, Tactile cues, and Verbal cues Education comprehension: verbalized understanding and needs further education  HOME EXERCISE PROGRAM: Access Code: [email or web address] URL: [email or web address] Date: 12/08/2022 Prepared by: Mickie Bail Kirkland Figg  Exercises - Sit to Stand Without Arm Support  - 1 x daily - 7 x weekly - 3 sets - 10 reps - Standing Reach to Opposite Side with Weight Shift  - 1 x daily - 7 x weekly - 3 sets - 10 reps - Seated Windmill Trunk Rotation Stretch  - 1 x daily - 7 x weekly - 3 sets - 10 reps - Step sideways with arms reaching at counter   - 1 x daily - 7 x weekly - 3 sets - 10 reps  GOALS: Goals reviewed with patient? Yes  SHORT TERM GOALS: Target date: 01/01/2023  Pt will be independent with PD specific initial HEP in order to build upon functional gains made in therapy. Baseline: Goal status: INITIAL  2.  Pt will improve MiniBest to 15/28 for decreased fall risk and improvement with compensatory stepping strategies.   Baseline: 12/28 on 2/5 Goal status: REVISED  3.  Pt will verbalize understanding of fall prevention in the home.  Baseline:  Goal status: INITIAL  4.  Pt will verbalize/demonstrate understanding of freezing techniques Baseline:  Goal status: INITIAL  5.  Pt will improve manual TUG time to 21 seconds or less in order to demo decrease fall risk. Baseline: 27 seconds  Goal status: INITIAL  6.  Pt will  improve 5x sit<>stand to less than or equal to 17.5 sec to demonstrate improved functional strength and transfer efficiency.  Baseline: 20.28 seconds without UE support Goal status: INITIAL  LONG TERM GOALS: Target date: 01/29/2023  Pt will be independent with PD specific final HEP in order to build upon functional gains made in therapy. Baseline:  Goal status: INITIAL  2.  Pt will improve MiniBest to 18/28 for decreased fall risk and improvement with compensatory stepping strategies.   Baseline: 12/28  Goal status: REVISED  3.  Pt will improve 5x sit<>stand to less than or equal to 15 sec to demonstrate improved functional strength and transfer efficiency.  Baseline: 20.28 seconds without UE support Goal status: INITIAL  4.  Pt will improve gait speed with no AD to at least 3.3 ft/sec in order to demo improved community mobility.  Baseline: 11.07 seconds = 2.96 ft/sec Goal status: INITIAL  5.   Pt will improve manual TUG time to 17 seconds or less in order to demo decrease fall risk. Baseline: 27 seconds  Goal status: INITIAL  6.  Pt will verbalize understanding of community resources for PD.  Baseline:  Goal status: INITIAL  ASSESSMENT:  CLINICAL IMPRESSION: Emphasis of skilled PT session on UE/LE coordination and facilitation of arm swing w/gait. Pt very challenged by reciprocal coordination tasks, especially on L side, even w/max multimodal cues. Pt did improve w/use of trekking poles, but had minor anterior LOB due to catching L foot on ground. Pt will continue to benefit from reciprocal coordination tasks and high amplitude movements for improved reactive balance and stability with gait. Continue POC.    OBJECTIVE  IMPAIRMENTS: Abnormal gait, decreased activity tolerance, decreased balance, decreased coordination, decreased mobility, difficulty walking, decreased strength, impaired flexibility, impaired tone, and postural dysfunction.   ACTIVITY LIMITATIONS: bending,  stairs, transfers, dressing, and locomotion level  PARTICIPATION LIMITATIONS: driving and community activity  PERSONAL FACTORS: Behavior pattern, Past/current experiences, Time since onset of injury/illness/exacerbation, and 3+ comorbidities: HTN, Diabetes, HLD, newly diagnosed PD  are also affecting patient's functional outcome.   REHAB POTENTIAL: Good  CLINICAL DECISION MAKING: Evolving/moderate complexity  EVALUATION COMPLEXITY: Moderate  PLAN:  PT FREQUENCY: 2x/week  PT DURATION: 8 weeks  PLANNED INTERVENTIONS: Therapeutic exercises, Therapeutic activity, Neuromuscular re-education, Balance training, Gait training, Patient/Family education, Self Care, Stair training, Vestibular training, DME instructions, and Re-evaluation  PLAN FOR NEXT SESSION: Add to Initial HEP - Standing PWR moves, sit <> stands and other balance deficits found on miniBEST. Freezing strategies. Gait training with larger amplitude movements and arm swing. Turns, lateral weight shifting, unlevel surfaces    Bettejane Leavens E Billal Rollo, PT, DPT  12/15/2022, 4:41 PM

## 2022-12-19 ENCOUNTER — Ambulatory Visit: Payer: Medicare Other | Admitting: Physical Therapy

## 2022-12-19 DIAGNOSIS — R2689 Other abnormalities of gait and mobility: Secondary | ICD-10-CM

## 2022-12-19 DIAGNOSIS — R2681 Unsteadiness on feet: Secondary | ICD-10-CM | POA: Diagnosis not present

## 2022-12-19 DIAGNOSIS — M6281 Muscle weakness (generalized): Secondary | ICD-10-CM

## 2022-12-19 NOTE — Therapy (Signed)
OUTPATIENT PHYSICAL THERAPY NEURO TREATMENT   Patient Name: Edward Briggs MRN: [id number] DOB:Apr 25, 1951, 72 y.o., male Today's Date: 12/19/2022   PCP: [name], MD  REFERRING PROVIDER:  [name], MD  END OF SESSION:  PT End of Session - 12/19/22 1319     Visit Number 5    Number of Visits 17    Date for PT Re-Evaluation 02/02/23    Authorization Type [employer] Medicare    PT Start Time 1318    PT Stop Time 1403    PT Time Calculation (min) 45 min    Equipment Utilized During Treatment Gait belt    Activity Tolerance Patient tolerated treatment well    Behavior During Therapy WFL for tasks assessed/performed                 Past Medical History:  Diagnosis Date   Diabetes mellitus without complication ([place])    GERD (gastroesophageal reflux disease)    no meds   Hypertension    controlled on meds   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   Past Surgical History:  Procedure Laterality Date   ABSCESS DRAINAGE     COLONOSCOPY     ~ 10 yrs ago   [place]  03/26/2012   Procedure: HARDWARE REMOVAL;  Surgeon: [name], MD;  Location: [facility];  Service: Orthopedics;  Laterality: Left;  hardware removal deep left ankle syndesmotic screws only and stress xrays ankle    ORIF ANKLE FRACTURE  12/03/2011   Procedure: OPEN REDUCTION INTERNAL FIXATION (ORIF) ANKLE FRACTURE;  Surgeon: [name], MD;  Location: [facility];  Service: Orthopedics;  Laterality: Left;  left medial malleolus fracture   PILONIDAL CYST EXCISION  ~1982   SKIN GRAFT  ~1970   Lt great toe (graft from Lt thigh)   Patient Active Problem List   Diagnosis Date Noted   HTN (hypertension) 123456   Acute metabolic encephalopathy 123456   AKI (acute kidney injury) ([place]) 07/22/2019   Sepsis ([place]) 07/22/2019   UTI (urinary tract infection) 07/22/2019   Diabetes mellitus without complication ([place]) 123XX123   CHEST PAIN  07/21/2009    ONSET DATE: 12/01/2022   REFERRING DIAG: G20.A1 (ICD-10-CM) - Parkinson's disease without dyskinesia, unspecified whether manifestations fluctuate   THERAPY DIAG:  Unsteadiness on feet  Other abnormalities of gait and mobility  Muscle weakness (generalized)  Rationale for Evaluation and Treatment: Rehabilitation  SUBJECTIVE:  SUBJECTIVE STATEMENT: Pt reports doing well, works on his exercises about once a day. Low back is more bothersome today. No falls   Pt accompanied by:  Spouse, [name]    PERTINENT HISTORY: PMH: HTN, Diabetes, HLD   Pt saw neurologist [name] on 12/01/22 for memory impairment, per 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 Pt started on Sinemet   PAIN:  Are you having pain? Yes: NPRS scale: 7/10 Pain location: low back Pain description: "it is just there"  PRECAUTIONS: Fall  WEIGHT BEARING RESTRICTIONS: No  FALLS: Has patient fallen in last 6 months? Yes. Number of falls 2  LIVING ENVIRONMENT: Lives with: lives with their spouse Lives in: House/apartment Stairs: Yes: Internal: 10 steps; on right going up and External: 4 steps; on right going up Has following equipment at home: None  PLOF: Independent  PATIENT GOALS: Wants to get back to normal.   OBJECTIVE:   DIAGNOSTIC FINDINGS: last brain imaging was a CT of the head done in September 2020 and showed some cerebral atrophy some small vessel white matter disease but no acute intracranial pathology.   COGNITION: Overall cognitive status:  Reports some memory changes.    SENSATION: WFL  COORDINATION: Bradykinesia RLE>LLE    MUSCLE TONE: RLE: Rigidity   POSTURE: rounded  shoulders, forward head, posterior pelvic tilt, and flexed trunk    LOWER EXTREMITY MMT:    MMT Right Eval Left Eval  Hip flexion 4+ 4  Hip extension    Hip abduction 5 5  Hip adduction 4+ 4+  Hip internal rotation    Hip external rotation    Knee flexion 5 5  Knee extension 4+ 4+  Ankle dorsiflexion 4+ 4+  Ankle plantarflexion    Ankle inversion    Ankle eversion    (Blank rows = not tested)  All tested in sitting.  TODAY'S TREATMENT:      Ther Ex SciFit multi-peaks level 8 for 8 minutes using BUE/BLEs for neural priming for reciprocal movement, dynamic cardiovascular warmup and increased amplitude of stepping. RPE of 7-8/10 following activity.   NMR  Standing scapular retraction and cervical extension against wall, 4x30s holds, for improved postural control. Min cues to maintain contact of posterior shoulder w/wall to facilitate upright posture and reduce shoulder shrug compensation  Side-lying open books, x10 per side, for improved thoracic rotation and posture. Mod multimodal cues for proper form initially and min A to swing legs onto mat.  Added both of the above to HEP (see bolded below) as pt enjoyed them   GAIT: Gait pattern: step through pattern, decreased arm swing- Right, decreased arm swing- Left, decreased step length- Right, decreased step length- Left, decreased stride length, decreased ankle dorsiflexion- Right, decreased ankle dorsiflexion- Left, Right foot flat, Left foot flat, shuffling, decreased trunk rotation, and trunk flexed Distance walked: 345' plus various clinic distances  Assistive device utilized: None Level of assistance: SBA and Min A (w/two therapists)   Comments: Practiced ambulating 66' while holding handle of trekking poles w/therapist holding other end to promote arm swing w/2nd therapist guarding. Pt ambulated about 250' w/good gait mechanics and proper arm swing. Pt then tripped over R foot unexpectedly, requiring min A to prevent fall. Pt  unable to verbalize why he tripped and stated it happens occasionally at home. Pt had no other instance of tripping/LOB throughout session.     PATIENT EDUCATION: Education details:  Additions to HEP  Person educated: Patient and Spouse Education method: Explanation, Demonstration, Tactile cues, and Verbal cues Education comprehension: verbalized understanding and needs further education  HOME EXERCISE PROGRAM: Access Code: [email or web address] URL: [email or web address] Date: 12/08/2022 Prepared by: Mickie Bail Lyonel Morejon  Exercises - Sit to Stand Without Arm Support  - 1 x daily - 7 x weekly - 3 sets - 10 reps - Standing Reach to Opposite Side with Weight Shift  - 1 x daily - 7 x weekly - 3 sets - 10 reps - Seated Windmill Trunk Rotation Stretch  - 1 x daily - 7 x weekly - 3 sets - 10 reps - Step sideways with arms reaching at counter   - 1 x daily - 7 x weekly - 3 sets - 10 reps - Sidelying Thoracic Rotation with Open Book  - 1 x daily - 7 x weekly - 3 sets - 10 reps - Cervical Retraction at Wall  - 1 x daily - 7 x weekly - 3-5 reps - 30 second hold  GOALS: Goals reviewed with patient? Yes  SHORT TERM GOALS: Target date: 01/01/2023  Pt will be independent with PD specific initial HEP in order to build upon functional gains made in therapy. Baseline: Goal status: INITIAL  2.  Pt will improve MiniBest to 15/28 for decreased fall risk and improvement with compensatory stepping strategies.   Baseline: 12/28 on 2/5 Goal status: REVISED  3.  Pt will verbalize understanding of fall prevention in the home.  Baseline:  Goal status: INITIAL  4.  Pt will verbalize/demonstrate understanding of freezing techniques Baseline:  Goal status: INITIAL  5.  Pt will improve manual TUG time to 21 seconds or less in order to demo decrease fall risk. Baseline: 27 seconds  Goal status: INITIAL  6.  Pt will improve 5x sit<>stand to less than or equal to 17.5 sec to demonstrate improved functional  strength and transfer efficiency.  Baseline: 20.28 seconds without UE support Goal status: INITIAL  LONG TERM GOALS: Target date: 01/29/2023  Pt will be independent with PD specific final HEP in order to build upon functional gains made in therapy. Baseline:  Goal status: INITIAL  2.  Pt will improve MiniBest to 18/28 for decreased fall risk and improvement with compensatory stepping strategies.   Baseline: 12/28  Goal status: REVISED  3.  Pt will improve 5x sit<>stand to less than or equal to 15 sec to demonstrate improved functional strength and transfer efficiency.  Baseline: 20.28 seconds without UE support Goal status: INITIAL  4.  Pt will improve gait speed with no AD to at least 3.3 ft/sec in order to demo improved community mobility.  Baseline: 11.07 seconds = 2.96 ft/sec Goal status: INITIAL  5.   Pt will improve manual TUG time to 17 seconds or less in order to demo decrease fall risk. Baseline: 27 seconds  Goal status: INITIAL  6.  Pt will verbalize understanding of community resources for PD.  Baseline:  Goal status: INITIAL  ASSESSMENT:  CLINICAL IMPRESSION: Emphasis of skilled PT session on improved postural control, thoracic rotation and reciprocal coordination. Pt initially reporting low back pain that dissipated by end of session w/stretching and light mobility. Pt again tripped over R foot while ambulating with trekking poles, requiring min A to prevent fall. Will continue to work on proper foot placement and step clearance for improves safety with gait. Continue POC.    OBJECTIVE IMPAIRMENTS: Abnormal gait, decreased activity tolerance, decreased balance, decreased coordination, decreased mobility, difficulty  walking, decreased strength, impaired flexibility, impaired tone, and postural dysfunction.   ACTIVITY LIMITATIONS: bending, stairs, transfers, dressing, and locomotion level  PARTICIPATION LIMITATIONS: driving and community activity  PERSONAL  FACTORS: Behavior pattern, Past/current experiences, Time since onset of injury/illness/exacerbation, and 3+ comorbidities: HTN, Diabetes, HLD, newly diagnosed PD  are also affecting patient's functional outcome.   REHAB POTENTIAL: Good  CLINICAL DECISION MAKING: Evolving/moderate complexity  EVALUATION COMPLEXITY: Moderate  PLAN:  PT FREQUENCY: 2x/week  PT DURATION: 8 weeks  PLANNED INTERVENTIONS: Therapeutic exercises, Therapeutic activity, Neuromuscular re-education, Balance training, Gait training, Patient/Family education, Self Care, Stair training, Vestibular training, DME instructions, and Re-evaluation  PLAN FOR NEXT SESSION: Add to Initial HEP - Standing PWR moves, sit <> stands and other balance deficits found on miniBEST. Freezing strategies. Gait training with larger amplitude movements and arm swing. Turns, lateral weight shifting, unlevel surfaces, resisted gait    Zaela Graley E Oliviah Agostini, PT, DPT  12/19/2022, 2:03 PM

## 2022-12-22 ENCOUNTER — Ambulatory Visit: Payer: Medicare Other | Admitting: Physical Therapy

## 2022-12-22 ENCOUNTER — Ambulatory Visit: Payer: Medicare Other | Admitting: Occupational Therapy

## 2022-12-22 ENCOUNTER — Encounter: Payer: Self-pay | Admitting: Occupational Therapy

## 2022-12-22 DIAGNOSIS — R2681 Unsteadiness on feet: Secondary | ICD-10-CM | POA: Diagnosis not present

## 2022-12-22 DIAGNOSIS — R278 Other lack of coordination: Secondary | ICD-10-CM

## 2022-12-22 DIAGNOSIS — R41842 Visuospatial deficit: Secondary | ICD-10-CM

## 2022-12-22 DIAGNOSIS — R293 Abnormal posture: Secondary | ICD-10-CM

## 2022-12-22 DIAGNOSIS — R2689 Other abnormalities of gait and mobility: Secondary | ICD-10-CM

## 2022-12-22 DIAGNOSIS — M6281 Muscle weakness (generalized): Secondary | ICD-10-CM

## 2022-12-22 DIAGNOSIS — R41840 Attention and concentration deficit: Secondary | ICD-10-CM

## 2022-12-22 NOTE — Therapy (Signed)
OUTPATIENT OCCUPATIONAL THERAPY PARKINSON'S EVALUATION  Patient Name: Edward Briggs MRN: [id number] DOB:25-Sep-1951, 72 y.o., male Today's Date: 12/08/2022  PCP: [name], MD  REFERRING PROVIDER: [name], MD   END OF SESSION:  OT End of Session - 12/08/22 1534     Visit Number 1    Number of Visits 25    Date for OT Re-Evaluation 03/06/23    Authorization Type [employer] Medicare    OT Start Time 1533    OT Stop Time 1615    OT Time Calculation (min) 42 min    Activity Tolerance Patient tolerated treatment well    Behavior During Therapy WFL for tasks assessed/performed;Flat affect            Past Medical History:  Diagnosis Date   Diabetes mellitus without complication ([place])    GERD (gastroesophageal reflux disease)    no meds   Hypertension    controlled on meds   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   Past Surgical History:  Procedure Laterality Date   ABSCESS DRAINAGE     COLONOSCOPY     ~ 10 yrs ago   [place]  03/26/2012   Procedure: HARDWARE REMOVAL;  Surgeon: [name], MD;  Location: [facility];  Service: Orthopedics;  Laterality: Left;  hardware removal deep left ankle syndesmotic screws only and stress xrays ankle    ORIF ANKLE FRACTURE  12/03/2011   Procedure: OPEN REDUCTION INTERNAL FIXATION (ORIF) ANKLE FRACTURE;  Surgeon: [name], MD;  Location: [facility];  Service: Orthopedics;  Laterality: Left;  left medial malleolus fracture   PILONIDAL CYST EXCISION  ~1982   SKIN GRAFT  ~1970   Lt great toe (graft from Lt thigh)   Patient Active Problem List   Diagnosis Date Noted   HTN (hypertension) 123456   Acute metabolic encephalopathy 123456   AKI (acute kidney injury) ([place]) 07/22/2019   Sepsis ([place]) 07/22/2019   UTI (urinary tract infection) 07/22/2019   Diabetes mellitus without complication ([place]) 123XX123   CHEST PAIN 07/21/2009    ONSET DATE:  12/01/2022  REFERRING DIAG: G20.A1 (ICD-10-CM) - Parkinson's disease without dyskinesia, unspecified whether manifestations fluctuate   THERAPY DIAG:  Other lack of coordination  Muscle weakness (generalized)  Visuospatial deficit  Attention and concentration deficit  Other symptoms and signs involving the nervous system  Rationale for Evaluation and Treatment: Rehabilitation  SUBJECTIVE:   SUBJECTIVE STATEMENT: Denies pain  Pt accompanied by: significant other - [name]  PERTINENT HISTORY:   Per PT eval, " PMH: HTN, Diabetes, HLD    Pt saw neurologist [name] on 12/01/22 for memory impairment, per 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 Pt started on Sinemet.  Noticed a difference in his walking about 4-6 weeks ago. Was started on Sinemet and started taking it (1/31). Has been taking small, shuffled steps. Notices that he will lean forward at times. Getting up from a chair is getting more difficult. Has had 2 falls - one he was getting up and his lost balance, 2nd fall was down the stairs and missed a step."    PRECAUTIONS: Fall  WEIGHT BEARING RESTRICTIONS: No  PAIN:  Are you having pain? No  FALLS: Has patient fallen in last 6 months? Yes. Number of falls 2  LIVING ENVIRONMENT: Lives with: lives with their spouse  Lives in: House/apartment Stairs: Yes: Internal: 10 steps; on right going up and External: 4 steps; on right going up Has following equipment at home: None   PLOF: Independent   PATIENT GOALS: Wants to get back to normal.   OBJECTIVE:   HAND DOMINANCE: Right  ADLs: Transfers/ambulation related to ADLs: Eating: mod I Grooming: I UB Dressing: min A LB Dressing: mod A Toileting: min A with buttoning pants Bathing: Distance supervision  Tub Shower transfers: distance supervision Equipment: Careers information officer [place]  IADLs: Shopping: same  as PLOF Light housekeeping: mod I Meal Prep: light meal prep, has only been cooking breakfast Community mobility: driving, short distance, no highway Medication management: min A Financial management: I Handwriting: 25% legible  MOBILITY STATUS: Needs Assist: SBA  ACTIVITY TOLERANCE: Activity tolerance: good  FUNCTIONAL OUTCOME MEASURES: Fastening/unfastening 3 buttons: 1:55  Physical performance test: PPT#2 (simulated eating) 27 seconds R = 1 & PPT#4 (donning/doffing jacket): 27 seconds = 1  COORDINATION: 9 Hole Peg test: Right: 55 sec; Left: 42 sec Box and Blocks:  Right 26 blocks, Left 31blocks  Grip strength: 73.4 lbs R ; 74.5 lbs L  UE ROM:  WFL  UE MMT:   WFL  SENSATION: WFL  MUSCLE TONE: RUE: Moderate more prominent in shoulder and LUE: Mild more prominent in elbow  COGNITION: Overall cognitive status: Impaired - impaired motor planning, processing,  OBSERVATIONS: Bradykinesia and Pill rolling L hand;  Difficulty following commands  TODAY'S TREATMENT:     12/22/22    PWR! Hands followed by instruction in initial coordiantion HEP, increased time and mod v.c and demonstration . Handwriting activity initiated with foam grip and v.c for larger letter size, min v.c                                                                                                     PATIENT EDUCATION: Education details: beginning coordination HEP Person educated: Patient and Spouse Education method: Customer service manager, handout, verbal cues, demonstration Education comprehension: verbalized understanding, returned demonstration, and needs further education  HOME EXERCISE PROGRAM: 12/22/22 coordination HEP  GOALS:  SHORT TERM GOALS: Target date: 01/05/2023    Pt will be independent with PD specific HEP.  Baseline: not yet initiated Goal status: INITIAL  2.  Pt will verbalize understanding of adapted strategies to maximize safety and independence with  ADLs/IADLs.  Baseline: not yet initiated Goal status: INITIAL  3.  Pt will write a sentence with no significant decrease in size and maintain 75% legibility.  Baseline: 25% legibility Goal status: INITIAL  4.  Pt will demonstrate improved ease with fastening buttons as evidenced by decreasing 3 button/unbutton time by 5 seconds  Baseline: 1 minute 55 seconds Goal status: INITIAL    LONG TERM GOALS: Target date: 03/06/2023    Pt will verbalize understanding of ways to prevent future PD related complications and PD community resources.  Baseline: not yet initiated Goal status: INITIAL  2.  Pt will write a short paragraph with no significant decrease in size and maintain 100% legibility.  Baseline: 25% legibility Goal status: INITIAL  3.  Pt will verbalize understanding of ways to keep thinking skills sharp and ways to compensate for STM changes in the future.  Baseline: not yet initiated Goal status: INITIAL  4.  Pt will demonstrate improved ease with feeding as evidenced by decreasing PPT#2 by 3 secs.  Baseline: 27 secs Goal status: INITIAL  5.  Pt will demonstrate increased ease with dressing as evidenced by decreasing PPT#4 (don/ doff jacket) to 24 secs or less.  Baseline: 27 secs Goal status: INITIAL  6.  Pt will demonstrate improved fine motor coordination for ADLs as evidenced by decreasing 9 hole peg test score for each hand by 5 secs  Baseline: Right: 55 sec; Left: 42 sec  Goal status: INITIAL   7.  Pt will demonstrate improved ease with fastening buttons as evidenced by decreasing 3 button/unbutton time by 10 seconds  Baseline: 1 minute 55 seconds seconds Goal status: INITIAL  8.  Pt will be able to place at least 29 blocks using R hand with completion of Box and Blocks test.  Baseline: 26 blocks Goal status: INITIAL    ASSESSMENT:  CLINICAL IMPRESSION: Pt is progressing towards goals. Pt/ wife verbalize understanding, however pt may benefit from  reinforcement.  PERFORMANCE DEFICITS: in functional skills including ADLs, IADLs, coordination, tone, Fine motor control, Gross motor control, mobility, balance, body mechanics, and UE functional use, cognitive skills including attention, perception, problem solving, sequencing, and understand  IMPAIRMENTS: are limiting patient from ADLs, IADLs, and leisure.   COMORBIDITIES:  has co-morbidities such as HTN, HLD, DM  that affects occupational performance. Patient will benefit from skilled OT to address above impairments and improve overall function.  MODIFICATION OR ASSISTANCE TO COMPLETE EVALUATION: Min-Moderate modification of tasks or assist with assess necessary to complete an evaluation.  OT OCCUPATIONAL PROFILE AND HISTORY: Detailed assessment: Review of records and additional review of physical, cognitive, psychosocial history related to current functional performance.  CLINICAL DECISION MAKING: Moderate - several treatment options, min-mod task modification necessary  REHAB POTENTIAL: Fair given PD diagnosis  EVALUATION COMPLEXITY: Moderate    PLAN:  OT FREQUENCY: 2x/week  OT DURATION: 12 weeks  PLANNED INTERVENTIONS: self care/ADL training, therapeutic exercise, therapeutic activity, neuromuscular re-education, manual therapy, functional mobility training, ultrasound, paraffin, fluidotherapy, moist heat, patient/family education, cognitive remediation/compensation, visual/perceptual remediation/compensation, energy conservation, coping strategies training, DME and/or AE instructions, and Re-evaluation  RECOMMENDED OTHER SERVICES: none at this time  CONSULTED AND AGREED WITH PLAN OF CARE: Patient and family member/caregiver  PLAN FOR NEXT SESSION: ADL strategies, fastening buttons, donning jacket, handwriting with foam grip-issue and review handout for writing with PD  Theone Murdoch, OTR/L [phone] Phone: [phone] 4:10 PM 12/22/22

## 2022-12-22 NOTE — Patient Instructions (Signed)
Coordination Exercises  Perform the following exercises for 20 minutes 1 times per day. Perform with both hand(s). Perform using big movements.  Flipping Cards: Place deck of cards on the table. Flip cards over by opening your hand big to grasp and then turn your palm up big. Deal cards: Hold 1/2 or whole deck in your hand. Use thumb to push card off top of deck with one big push. Rotate ball with fingertips: Pick up with fingers/thumb and move as much as you can with each turn/movement (clockwise and counter-clockwise). Pick up coins and stack one at a time: Pick up with big, intentional movements. Do not drag coin to the edge. (5-10 in a stack) Pick up 5-10 coins and hold in palm. Then, move coins from palm to fingertips one at time and place in coin bank/container. Practice writing: Slow down, write big, and focus on forming each letter. Perform "Flicks"/hand stretches (PWR! Hands): Close hands then flick out your fingers with focus on opening hands, pulling wrists back, and extending elbows like you are pushing.

## 2022-12-22 NOTE — Therapy (Signed)
OUTPATIENT PHYSICAL THERAPY NEURO TREATMENT   Patient Name: Edward Briggs MRN: [id number] DOB:1951/09/16, 72 y.o., male Today's Date: 12/22/2022   PCP: [name], MD  REFERRING PROVIDER:  [name], MD  END OF SESSION:  PT End of Session - 12/22/22 1448     Visit Number 6    Number of Visits 17    Date for PT Re-Evaluation 02/02/23    Authorization Type [employer] Medicare    PT Start Time 1447    PT Stop Time 1532    PT Time Calculation (min) 45 min    Equipment Utilized During Treatment Gait belt    Activity Tolerance Patient tolerated treatment well    Behavior During Therapy WFL for tasks assessed/performed                  Past Medical History:  Diagnosis Date   Diabetes mellitus without complication ([place])    GERD (gastroesophageal reflux disease)    no meds   Hypertension    controlled on meds   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   Past Surgical History:  Procedure Laterality Date   ABSCESS DRAINAGE     COLONOSCOPY     ~ 10 yrs ago   [place]  03/26/2012   Procedure: HARDWARE REMOVAL;  Surgeon: [name], MD;  Location: [facility];  Service: Orthopedics;  Laterality: Left;  hardware removal deep left ankle syndesmotic screws only and stress xrays ankle    ORIF ANKLE FRACTURE  12/03/2011   Procedure: OPEN REDUCTION INTERNAL FIXATION (ORIF) ANKLE FRACTURE;  Surgeon: [name], MD;  Location: [facility];  Service: Orthopedics;  Laterality: Left;  left medial malleolus fracture   PILONIDAL CYST EXCISION  ~1982   SKIN GRAFT  ~1970   Lt great toe (graft from Lt thigh)   Patient Active Problem List   Diagnosis Date Noted   HTN (hypertension) 123456   Acute metabolic encephalopathy 123456   AKI (acute kidney injury) ([place]) 07/22/2019   Sepsis ([place]) 07/22/2019   UTI (urinary tract infection) 07/22/2019   Diabetes mellitus without complication ([place]) 123XX123   CHEST PAIN  07/21/2009    ONSET DATE: 12/01/2022   REFERRING DIAG: G20.A1 (ICD-10-CM) - Parkinson's disease without dyskinesia, unspecified whether manifestations fluctuate   THERAPY DIAG:  Unsteadiness on feet  Other abnormalities of gait and mobility  Muscle weakness (generalized)  Rationale for Evaluation and Treatment: Rehabilitation  SUBJECTIVE:  SUBJECTIVE STATEMENT: Pt denies changes since last visit. HEP is still challenging. Back is feeling a lot better today.   Pt accompanied by:  Spouse, [name]    PERTINENT HISTORY: PMH: HTN, Diabetes, HLD   Pt saw neurologist [name] on 12/01/22 for memory impairment, per 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 Pt started on Sinemet   PAIN:  Are you having pain? No  PRECAUTIONS: Fall  WEIGHT BEARING RESTRICTIONS: No  FALLS: Has patient fallen in last 6 months? Yes. Number of falls 2  LIVING ENVIRONMENT: Lives with: lives with their spouse Lives in: House/apartment Stairs: Yes: Internal: 10 steps; on right going up and External: 4 steps; on right going up Has following equipment at home: None  PLOF: Independent  PATIENT GOALS: Wants to get back to normal.   OBJECTIVE:   DIAGNOSTIC FINDINGS: last brain imaging was a CT of the head done in September 2020 and showed some cerebral atrophy some small vessel white matter disease but no acute intracranial pathology.   COGNITION: Overall cognitive status:  Reports some memory changes.    SENSATION: WFL  COORDINATION: Bradykinesia RLE>LLE    MUSCLE TONE: RLE: Rigidity   POSTURE: rounded shoulders, forward head, posterior pelvic tilt, and flexed trunk    LOWER EXTREMITY MMT:     MMT Right Eval Left Eval  Hip flexion 4+ 4  Hip extension    Hip abduction 5 5  Hip adduction 4+ 4+  Hip internal rotation    Hip external rotation    Knee flexion 5 5  Knee extension 4+ 4+  Ankle dorsiflexion 4+ 4+  Ankle plantarflexion    Ankle inversion    Ankle eversion    (Blank rows = not tested)  All tested in sitting.  TODAY'S TREATMENT:      NMR  Sit to stands x15 w/red theraband abduction w/BUEs for improved anterior weight shift and postural control. Added to HEP (see bolded below). Pt required mod cues initially for proper sequencing but performed well w/equal pull w/each BUE. Noted good recruitment of scapular retractors without shrug compensation.  In // bars, alternating fwd adv/retreat over 4" foam beam for improved lateral weight shifting, step clearance and LE coordination. Pt required min concurrent verbal cues for reciprocal sequence and proper technique but quickly was able to perform without cues w/practice. BUE support throughout for steadying assist.  Progressed to lateral adv/retreat over 4" beam, x12 per side. Pt w/increased difficulty performing on RLE >LLE due to decreased weight shift to L side.   GAIT Gait pattern: step through pattern, decreased arm swing- Right, decreased arm swing- Left, decreased stride length, decreased hip/knee flexion- Right, decreased hip/knee flexion- Left, decreased ankle dorsiflexion- Right, decreased ankle dorsiflexion- Left, shuffling, festinating, decreased trunk rotation, and poor foot clearance- Right Distance walked: 73' plus various clinic distances  Assistive device utilized: None Level of assistance: Min A x2 therapists  Comments: Pt ambulated 345' w/random posterolateral perturbations provided by 2nd therapist to work on stepping strategies and reactive balance. Pt very challenged w/perturbations to R side, requiring min A x2 to recover balance on several occasions due to crossover stepping and festination. Cued pt  to attempt to avoid crossover steps and take a large, single step w/ipsilateral LE to correct balance. Pt able to facilitate this when losing balance to L, but not R. Lengthy discussion regarding freezing strategies and to identify triggers of freezing  at home, as pt reports he experiences this often. Educated pt on stopping when he starts to freeze, weight shifting and taking a large step, but would benefit from practice in clinic.    PATIENT EDUCATION: Education details: Additions to HEP, identifying freezing triggers at home Person educated: Patient and Spouse Education method: Explanation, Demonstration, Tactile cues, Verbal cues, and Handouts Education comprehension: verbalized understanding and needs further education  HOME EXERCISE PROGRAM: Access Code: [email or web address] URL: [email or web address] Date: 12/08/2022 Prepared by: Mickie Bail Makenze Ellett  Exercises - Sit to Stand Without Arm Support  - 1 x daily - 7 x weekly - 3 sets - 10 reps - Standing Reach to Opposite Side with Weight Shift  - 1 x daily - 7 x weekly - 3 sets - 10 reps - Seated Windmill Trunk Rotation Stretch  - 1 x daily - 7 x weekly - 3 sets - 10 reps - Step sideways with arms reaching at counter   - 1 x daily - 7 x weekly - 3 sets - 10 reps - Sidelying Thoracic Rotation with Open Book  - 1 x daily - 7 x weekly - 3 sets - 10 reps - Cervical Retraction at Wall  - 1 x daily - 7 x weekly - 3-5 reps - 30 second hold - Sit to stand with band pull-apart   - 1 x daily - 7 x weekly - 3 sets - 10 reps  GOALS: Goals reviewed with patient? Yes  SHORT TERM GOALS: Target date: 01/01/2023  Pt will be independent with PD specific initial HEP in order to build upon functional gains made in therapy. Baseline: Goal status: INITIAL  2.  Pt will improve MiniBest to 15/28 for decreased fall risk and improvement with compensatory stepping strategies.   Baseline: 12/28 on 2/5 Goal status: REVISED  3.  Pt will verbalize  understanding of fall prevention in the home.  Baseline:  Goal status: INITIAL  4.  Pt will verbalize/demonstrate understanding of freezing techniques Baseline:  Goal status: INITIAL  5.  Pt will improve manual TUG time to 21 seconds or less in order to demo decrease fall risk. Baseline: 27 seconds  Goal status: INITIAL  6.  Pt will improve 5x sit<>stand to less than or equal to 17.5 sec to demonstrate improved functional strength and transfer efficiency.  Baseline: 20.28 seconds without UE support Goal status: INITIAL  LONG TERM GOALS: Target date: 01/29/2023  Pt will be independent with PD specific final HEP in order to build upon functional gains made in therapy. Baseline:  Goal status: INITIAL  2.  Pt will improve MiniBest to 18/28 for decreased fall risk and improvement with compensatory stepping strategies.   Baseline: 12/28  Goal status: REVISED  3.  Pt will improve 5x sit<>stand to less than or equal to 15 sec to demonstrate improved functional strength and transfer efficiency.  Baseline: 20.28 seconds without UE support Goal status: INITIAL  4.  Pt will improve gait speed with no AD to at least 3.3 ft/sec in order to demo improved community mobility.  Baseline: 11.07 seconds = 2.96 ft/sec Goal status: INITIAL  5.   Pt will improve manual TUG time to 17 seconds or less in order to demo decrease fall risk. Baseline: 27 seconds  Goal status: INITIAL  6.  Pt will verbalize understanding of community resources for PD.  Baseline:  Goal status: INITIAL  ASSESSMENT:  CLINICAL IMPRESSION: Emphasis of skilled PT session on postural control, reactive balance strategies and lateral weight shifting.  Pt very challenged by resisted gait and required min A x2 to recover balance when resisted to R side due to crossover stepping and freezing. Pt unable to facilitate large step to R side despite neural priming and cues. Educated pt on freezing strategies to implement at home but pt  could benefit from added practice in clinic. Pt continues to demonstrate decreased weight shift to L side w/ambulatory tasks, resulting in decreased step length/clearance of RLE and frequent anterior LOB episodes requiring min-mod A to recover. Continue POC.    OBJECTIVE IMPAIRMENTS: Abnormal gait, decreased activity tolerance, decreased balance, decreased coordination, decreased mobility, difficulty walking, decreased strength, impaired flexibility, impaired tone, and postural dysfunction.   ACTIVITY LIMITATIONS: bending, stairs, transfers, dressing, and locomotion level  PARTICIPATION LIMITATIONS: driving and community activity  PERSONAL FACTORS: Behavior pattern, Past/current experiences, Time since onset of injury/illness/exacerbation, and 3+ comorbidities: HTN, Diabetes, HLD, newly diagnosed PD  are also affecting patient's functional outcome.   REHAB POTENTIAL: Good  CLINICAL DECISION MAKING: Evolving/moderate complexity  EVALUATION COMPLEXITY: Moderate  PLAN:  PT FREQUENCY: 2x/week  PT DURATION: 8 weeks  PLANNED INTERVENTIONS: Therapeutic exercises, Therapeutic activity, Neuromuscular re-education, Balance training, Gait training, Patient/Family education, Self Care, Stair training, Vestibular training, DME instructions, and Re-evaluation  PLAN FOR NEXT SESSION: Add to Initial HEP - Standing PWR moves, sit <> stands and other balance deficits found on miniBEST. Freezing strategies. Gait training with larger amplitude movements and arm swing. Turns, lateral weight shifting, unlevel surfaces, resisted gait, slam ball activities    Cruzita Lederer Keziah Avis, PT, DPT  12/22/2022, 3:37 PM

## 2022-12-24 ENCOUNTER — Encounter: Payer: Self-pay | Admitting: Occupational Therapy

## 2022-12-24 ENCOUNTER — Ambulatory Visit: Payer: Medicare Other | Admitting: Physical Therapy

## 2022-12-24 ENCOUNTER — Ambulatory Visit: Payer: Medicare Other | Admitting: Occupational Therapy

## 2022-12-24 DIAGNOSIS — R293 Abnormal posture: Secondary | ICD-10-CM

## 2022-12-24 DIAGNOSIS — Z9181 History of falling: Secondary | ICD-10-CM

## 2022-12-24 DIAGNOSIS — R2681 Unsteadiness on feet: Secondary | ICD-10-CM | POA: Diagnosis not present

## 2022-12-24 DIAGNOSIS — M6281 Muscle weakness (generalized): Secondary | ICD-10-CM

## 2022-12-24 DIAGNOSIS — R41840 Attention and concentration deficit: Secondary | ICD-10-CM

## 2022-12-24 DIAGNOSIS — R278 Other lack of coordination: Secondary | ICD-10-CM

## 2022-12-24 DIAGNOSIS — R41842 Visuospatial deficit: Secondary | ICD-10-CM

## 2022-12-24 DIAGNOSIS — R2689 Other abnormalities of gait and mobility: Secondary | ICD-10-CM

## 2022-12-24 DIAGNOSIS — R29818 Other symptoms and signs involving the nervous system: Secondary | ICD-10-CM

## 2022-12-24 NOTE — Therapy (Signed)
OUTPATIENT PHYSICAL THERAPY NEURO TREATMENT   Patient Name: Edward Briggs MRN: [id number] DOB:Dec 09, 1950, 72 y.o., male Today's Date: 12/24/2022   PCP: [name], MD  REFERRING PROVIDER:  [name], MD  END OF SESSION:  PT End of Session - 12/24/22 1539     Visit Number 7    Number of Visits 17    Date for PT Re-Evaluation 02/02/23    Authorization Type [employer] Medicare    PT Start Time 1537   Handoff w/OT   PT Stop Time [id number]    PT Time Calculation (min) 40 min    Equipment Utilized During Treatment Gait belt    Activity Tolerance Patient tolerated treatment well    Behavior During Therapy WFL for tasks assessed/performed                   Past Medical History:  Diagnosis Date   Diabetes mellitus without complication ([place])    GERD (gastroesophageal reflux disease)    no meds   Hypertension    controlled on meds   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   Past Surgical History:  Procedure Laterality Date   ABSCESS DRAINAGE     COLONOSCOPY     ~ 10 yrs ago   [place]  03/26/2012   Procedure: HARDWARE REMOVAL;  Surgeon: [name], MD;  Location: [facility];  Service: Orthopedics;  Laterality: Left;  hardware removal deep left ankle syndesmotic screws only and stress xrays ankle    ORIF ANKLE FRACTURE  12/03/2011   Procedure: OPEN REDUCTION INTERNAL FIXATION (ORIF) ANKLE FRACTURE;  Surgeon: [name], MD;  Location: [facility];  Service: Orthopedics;  Laterality: Left;  left medial malleolus fracture   PILONIDAL CYST EXCISION  ~1982   SKIN GRAFT  ~1970   Lt great toe (graft from Lt thigh)   Patient Active Problem List   Diagnosis Date Noted   HTN (hypertension) 123456   Acute metabolic encephalopathy 123456   AKI (acute kidney injury) ([place]) 07/22/2019   Sepsis ([place]) 07/22/2019   UTI (urinary tract infection) 07/22/2019   Diabetes mellitus without complication ([place]) 123XX123    CHEST PAIN 07/21/2009    ONSET DATE: 12/01/2022   REFERRING DIAG: G20.A1 (ICD-10-CM) - Parkinson's disease without dyskinesia, unspecified whether manifestations fluctuate   THERAPY DIAG:  Muscle weakness (generalized)  Abnormal posture  Unsteadiness on feet  Other abnormalities of gait and mobility  Rationale for Evaluation and Treatment: Rehabilitation  SUBJECTIVE:  SUBJECTIVE STATEMENT: Pt denies changes since last visit. No falls.  Pt accompanied by:  Spouse, [name]    PERTINENT HISTORY: PMH: HTN, Diabetes, HLD   Pt saw neurologist [name] on 12/01/22 for memory impairment, per 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 Pt started on Sinemet   PAIN:  Are you having pain? No  PRECAUTIONS: Fall  WEIGHT BEARING RESTRICTIONS: No  FALLS: Has patient fallen in last 6 months? Yes. Number of falls 2  LIVING ENVIRONMENT: Lives with: lives with their spouse Lives in: House/apartment Stairs: Yes: Internal: 10 steps; on right going up and External: 4 steps; on right going up Has following equipment at home: None  PLOF: Independent  PATIENT GOALS: Wants to get back to normal.   OBJECTIVE:   DIAGNOSTIC FINDINGS: last brain imaging was a CT of the head done in September 2020 and showed some cerebral atrophy some small vessel white matter disease but no acute intracranial pathology.   COGNITION: Overall cognitive status:  Reports some memory changes.    SENSATION: WFL  COORDINATION: Bradykinesia RLE>LLE    MUSCLE TONE: RLE: Rigidity   POSTURE: rounded shoulders, forward head, posterior pelvic tilt, and flexed trunk    LOWER EXTREMITY MMT:    MMT Right Eval  Left Eval  Hip flexion 4+ 4  Hip extension    Hip abduction 5 5  Hip adduction 4+ 4+  Hip internal rotation    Hip external rotation    Knee flexion 5 5  Knee extension 4+ 4+  Ankle dorsiflexion 4+ 4+  Ankle plantarflexion    Ankle inversion    Ankle eversion    (Blank rows = not tested)  All tested in sitting.  TODAY'S TREATMENT:      NMR  In // bars, rockerboard in A/P direction  Progressed to PWR up x10  Blaze pods  W/6 pods in semi-circle  Round 1- 6 hits  2- 11 hits  3- 14 hits  W/3 pods elevated on 4" boxes and 3 pods on floor Round 1- 12 hits  Round 2- 12  Round 3- 12  Seated windmills, x3 per side   GAIT Gait pattern: step through pattern, decreased arm swing- Right, decreased arm swing- Left, decreased stride length, decreased hip/knee flexion- Right, decreased hip/knee flexion- Left, decreased ankle dorsiflexion- Right, decreased ankle dorsiflexion- Left, shuffling, festinating, decreased trunk rotation, and poor foot clearance- Right Distance walked: 65' plus various clinic distances  Assistive device utilized: None Level of assistance: Min A x2 therapists  Comments: Pt ambulated 345' w/random posterolateral perturbations provided by 2nd therapist to work on stepping strategies and reactive balance. Pt very challenged w/perturbations to R side, requiring min A x2 to recover balance on several occasions due to crossover stepping and festination. Cued pt to attempt to avoid crossover steps and take a large, single step w/ipsilateral LE to correct balance. Pt able to facilitate this when losing balance to L, but not R. Lengthy discussion regarding freezing strategies and to identify triggers of freezing at home, as pt reports he experiences this often. Educated pt on stopping when he starts to freeze, weight shifting and taking a large step, but would benefit from practice in clinic. ***   PATIENT EDUCATION: Education details: Additions to HEP, identifying freezing  triggers at home Person educated: Patient and Spouse Education method: Explanation, Demonstration, Tactile cues, Verbal cues, and Handouts Education comprehension: verbalized understanding  and needs further education  HOME EXERCISE PROGRAM: Access Code: [email or web address] URL: [email or web address] Date: 12/08/2022 Prepared by: Mickie Bail Lakyla Biswas  Exercises - Sit to Stand Without Arm Support  - 1 x daily - 7 x weekly - 3 sets - 10 reps - Standing Reach to Opposite Side with Weight Shift  - 1 x daily - 7 x weekly - 3 sets - 10 reps - Seated Windmill Trunk Rotation Stretch  - 1 x daily - 7 x weekly - 3 sets - 10 reps - Step sideways with arms reaching at counter   - 1 x daily - 7 x weekly - 3 sets - 10 reps - Sidelying Thoracic Rotation with Open Book  - 1 x daily - 7 x weekly - 3 sets - 10 reps - Cervical Retraction at Wall  - 1 x daily - 7 x weekly - 3-5 reps - 30 second hold - Sit to stand with band pull-apart   - 1 x daily - 7 x weekly - 3 sets - 10 reps  GOALS: Goals reviewed with patient? Yes  SHORT TERM GOALS: Target date: 01/01/2023  Pt will be independent with PD specific initial HEP in order to build upon functional gains made in therapy. Baseline: Goal status: INITIAL  2.  Pt will improve MiniBest to 15/28 for decreased fall risk and improvement with compensatory stepping strategies.   Baseline: 12/28 on 2/5 Goal status: REVISED  3.  Pt will verbalize understanding of fall prevention in the home.  Baseline:  Goal status: INITIAL  4.  Pt will verbalize/demonstrate understanding of freezing techniques Baseline:  Goal status: INITIAL  5.  Pt will improve manual TUG time to 21 seconds or less in order to demo decrease fall risk. Baseline: 27 seconds  Goal status: INITIAL  6.  Pt will improve 5x sit<>stand to less than or equal to 17.5 sec to demonstrate improved functional strength and transfer efficiency.  Baseline: 20.28 seconds without UE support Goal status:  INITIAL  LONG TERM GOALS: Target date: 01/29/2023  Pt will be independent with PD specific final HEP in order to build upon functional gains made in therapy. Baseline:  Goal status: INITIAL  2.  Pt will improve MiniBest to 18/28 for decreased fall risk and improvement with compensatory stepping strategies.   Baseline: 12/28  Goal status: REVISED  3.  Pt will improve 5x sit<>stand to less than or equal to 15 sec to demonstrate improved functional strength and transfer efficiency.  Baseline: 20.28 seconds without UE support Goal status: INITIAL  4.  Pt will improve gait speed with no AD to at least 3.3 ft/sec in order to demo improved community mobility.  Baseline: 11.07 seconds = 2.96 ft/sec Goal status: INITIAL  5.   Pt will improve manual TUG time to 17 seconds or less in order to demo decrease fall risk. Baseline: 27 seconds  Goal status: INITIAL  6.  Pt will verbalize understanding of community resources for PD.  Baseline:  Goal status: INITIAL  ASSESSMENT:  CLINICAL IMPRESSION: Emphasis of skilled PT session on postural control, reactive balance strategies and lateral weight shifting. Pt very challenged by resisted gait and required min A x2 to recover balance when resisted to R side due to crossover stepping and freezing. Pt unable to facilitate large step to R side despite neural priming and cues. Educated pt on freezing strategies to implement at home but pt could benefit from added practice in clinic. Pt continues to demonstrate decreased weight shift to L  side w/ambulatory tasks, resulting in decreased step length/clearance of RLE and frequent anterior LOB episodes requiring min-mod A to recover. Continue POC.    OBJECTIVE IMPAIRMENTS: Abnormal gait, decreased activity tolerance, decreased balance, decreased coordination, decreased mobility, difficulty walking, decreased strength, impaired flexibility, impaired tone, and postural dysfunction.   ACTIVITY LIMITATIONS:  bending, stairs, transfers, dressing, and locomotion level  PARTICIPATION LIMITATIONS: driving and community activity  PERSONAL FACTORS: Behavior pattern, Past/current experiences, Time since onset of injury/illness/exacerbation, and 3+ comorbidities: HTN, Diabetes, HLD, newly diagnosed PD  are also affecting patient's functional outcome.   REHAB POTENTIAL: Good  CLINICAL DECISION MAKING: Evolving/moderate complexity  EVALUATION COMPLEXITY: Moderate  PLAN:  PT FREQUENCY: 2x/week  PT DURATION: 8 weeks  PLANNED INTERVENTIONS: Therapeutic exercises, Therapeutic activity, Neuromuscular re-education, Balance training, Gait training, Patient/Family education, Self Care, Stair training, Vestibular training, DME instructions, and Re-evaluation  PLAN FOR NEXT SESSION: Add to Initial HEP - Standing PWR moves, sit <> stands and other balance deficits found on miniBEST. Freezing strategies. Gait training with larger amplitude movements and arm swing. Turns, lateral weight shifting, unlevel surfaces, resisted gait, slam ball activities    Cruzita Lederer Macaulay Reicher, PT, DPT  12/24/2022, 4:24 PM

## 2022-12-24 NOTE — Therapy (Signed)
OUTPATIENT OCCUPATIONAL THERAPY PARKINSON'S EVALUATION  Patient Name: Edward Briggs MRN: [id number] DOB:[birth year], 72 y.o., male Today's Date: 12/24/2022  PCP: [name], MD  REFERRING PROVIDER: [name], MD   END OF SESSION:  OT End of Session - 12/24/22 1448     Visit Number 3    Number of Visits 25    Date for OT Re-Evaluation 03/06/23    Authorization Type [employer] Medicare    Progress Note Due on Visit 10    OT Start Time 1447    OT Stop Time 1531    OT Time Calculation (min) 44 min    Activity Tolerance Patient tolerated treatment well    Behavior During Therapy WFL for tasks assessed/performed            Past Medical History:  Diagnosis Date   Diabetes mellitus without complication ([place])    GERD (gastroesophageal reflux disease)    no meds   Hypertension    controlled on meds   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   Past Surgical History:  Procedure Laterality Date   ABSCESS DRAINAGE     COLONOSCOPY     ~ 10 yrs ago   [place]  03/26/2012   Procedure: HARDWARE REMOVAL;  Surgeon: [name], MD;  Location: [facility];  Service: Orthopedics;  Laterality: Left;  hardware removal deep left ankle syndesmotic screws only and stress xrays ankle    ORIF ANKLE FRACTURE  12/03/2011   Procedure: OPEN REDUCTION INTERNAL FIXATION (ORIF) ANKLE FRACTURE;  Surgeon: [name], MD;  Location: [facility];  Service: Orthopedics;  Laterality: Left;  left medial malleolus fracture   PILONIDAL CYST EXCISION  ~1982   SKIN GRAFT  ~1970   Lt great toe (graft from Lt thigh)   Patient Active Problem List   Diagnosis Date Noted   HTN (hypertension) 123456   Acute metabolic encephalopathy 123456   AKI (acute kidney injury) ([place]) 07/22/2019   Sepsis ([place]) 07/22/2019   UTI (urinary tract infection) 07/22/2019   Diabetes mellitus without complication ([place]) 123XX123   CHEST PAIN 07/21/2009    ONSET  DATE: 12/01/2022  REFERRING DIAG: G20.A1 (ICD-10-CM) - Parkinson's disease without dyskinesia, unspecified whether manifestations fluctuate   THERAPY DIAG:  Muscle weakness (generalized)  Other lack of coordination  Visuospatial deficit  Attention and concentration deficit  Abnormal posture  Other symptoms and signs involving the nervous system  History of falling  Rationale for Evaluation and Treatment: Rehabilitation  SUBJECTIVE:   SUBJECTIVE STATEMENT: Reports he forgot his reading glasses today.   Pt accompanied by: significant other - [name]  PERTINENT HISTORY:   Per PT eval, " PMH: HTN, Diabetes, HLD    Pt saw neurologist [name] on 12/01/22 for memory impairment, per 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 Pt started on Sinemet.  Noticed a difference in his walking about 4-6 weeks ago. Was started on Sinemet and started taking it (1/31). Has been taking small, shuffled steps. Notices that he will lean forward at times. Getting up from a chair is getting more difficult. Has had 2 falls - one he was getting up and his lost balance, 2nd fall was down the stairs and missed a step."    PRECAUTIONS: Fall  WEIGHT BEARING RESTRICTIONS: No  PAIN:  Are you having pain? No  FALLS:  Has patient fallen in last 6 months? Yes. Number of falls 2  LIVING ENVIRONMENT: Lives with: lives with their spouse Lives in: House/apartment Stairs: Yes: Internal: 10 steps; on right going up and External: 4 steps; on right going up Has following equipment at home: None   PLOF: Independent   PATIENT GOALS: Wants to get back to normal.   OBJECTIVE:   HAND DOMINANCE: Right  ADLs: Transfers/ambulation related to ADLs: Eating: mod I Grooming: I UB Dressing: min A LB Dressing: mod A Toileting: min A with buttoning pants Bathing: Distance supervision  Tub Shower  transfers: distance supervision Equipment: Careers information officer [place]  IADLs: Shopping: same as PLOF Light housekeeping: mod I Meal Prep: light meal prep, has only been cooking breakfast Community mobility: driving, short distance, no highway Medication management: min A Financial management: I Handwriting: 25% legible  MOBILITY STATUS: Needs Assist: SBA  ACTIVITY TOLERANCE: Activity tolerance: good  FUNCTIONAL OUTCOME MEASURES: Fastening/unfastening 3 buttons: 1:55  Physical performance test: PPT#2 (simulated eating) 27 seconds R = 1 & PPT#4 (donning/doffing jacket): 27 seconds = 1  COORDINATION: 9 Hole Peg test: Right: 55 sec; Left: 42 sec Box and Blocks:  Right 26 blocks, Left 31blocks  Grip strength: 73.4 lbs R ; 74.5 lbs L  UE ROM:  WFL  UE MMT:   WFL  SENSATION: WFL  MUSCLE TONE: RUE: Moderate more prominent in shoulder and LUE: Mild more prominent in elbow  COGNITION: Overall cognitive status: Impaired - impaired motor planning, processing,  OBSERVATIONS: Bradykinesia and Pill rolling L hand;  Difficulty following commands  TODAY'S TREATMENT:                                                                                                       - Therapeutic activities completed for duration as noted below including: OT educated pt and wife on use of AD with writing including use of Pen Again and lined, writing paper.  OT reviewed use of big movements with jacket donning and doffing as well as completion of other functional activities and incorporating into routines.   PATIENT EDUCATION: Education details: beginning coordination HEP Person educated: Patient and Spouse Education method: Customer service manager, handout, verbal cues, demonstration Education comprehension: verbalized understanding, returned demonstration, and needs further education  HOME EXERCISE PROGRAM: 12/22/22 coordination HEP  GOALS:  SHORT TERM GOALS: Target date: 01/05/2023    Pt will  be independent with PD specific HEP.  Baseline: not yet initiated Goal status: INITIAL  2.  Pt will verbalize understanding of adapted strategies to maximize safety and independence with ADLs/IADLs.  Baseline: not yet initiated Goal status: INITIAL  3.  Pt will write a sentence with no significant decrease in size and maintain 75% legibility.  Baseline: 25% legibility Goal status: INITIAL  4.  Pt will demonstrate improved ease with fastening buttons as evidenced by decreasing 3 button/unbutton time by 5 seconds  Baseline: 1 minute 55 seconds Goal status: INITIAL    LONG TERM GOALS: Target date: 03/06/2023    Pt will verbalize understanding of ways to prevent future PD related  complications and PD community resources.  Baseline: not yet initiated Goal status: INITIAL  2.  Pt will write a short paragraph with no significant decrease in size and maintain 100% legibility.  Baseline: 25% legibility Goal status: INITIAL  3.  Pt will verbalize understanding of ways to keep thinking skills sharp and ways to compensate for STM changes in the future.  Baseline: not yet initiated Goal status: INITIAL  4.  Pt will demonstrate improved ease with feeding as evidenced by decreasing PPT#2 by 3 secs.  Baseline: 27 secs Goal status: INITIAL  5.  Pt will demonstrate increased ease with dressing as evidenced by decreasing PPT#4 (don/ doff jacket) to 24 secs or less.  Baseline: 27 secs Goal status: INITIAL  6.  Pt will demonstrate improved fine motor coordination for ADLs as evidenced by decreasing 9 hole peg test score for each hand by 5 secs  Baseline: Right: 55 sec; Left: 42 sec  Goal status: INITIAL   7.  Pt will demonstrate improved ease with fastening buttons as evidenced by decreasing 3 button/unbutton time by 10 seconds  Baseline: 1 minute 55 seconds seconds Goal status: INITIAL  8.  Pt will be able to place at least 29 blocks using R hand with completion of Box and  Blocks test.  Baseline: 26 blocks Goal status: INITIAL    ASSESSMENT:  CLINICAL IMPRESSION: Pt is progressing towards goals. Pt/ wife verbalize understanding, however pt may benefit from reinforcement.  PERFORMANCE DEFICITS: in functional skills including ADLs, IADLs, coordination, tone, Fine motor control, Gross motor control, mobility, balance, body mechanics, and UE functional use, cognitive skills including attention, perception, problem solving, sequencing, and understand  IMPAIRMENTS: are limiting patient from ADLs, IADLs, and leisure.   COMORBIDITIES:  has co-morbidities such as HTN, HLD, DM  that affects occupational performance. Patient will benefit from skilled OT to address above impairments and improve overall function.  OT OCCUPATIONAL PROFILE AND HISTORY: Detailed assessment: Review of records and additional review of physical, cognitive, psychosocial history related to current functional performance.  REHAB POTENTIAL: Fair given PD diagnosis   PLAN:  OT FREQUENCY: 2x/week  OT DURATION: 12 weeks  PLANNED INTERVENTIONS: self care/ADL training, therapeutic exercise, therapeutic activity, neuromuscular re-education, manual therapy, functional mobility training, ultrasound, paraffin, fluidotherapy, moist heat, patient/family education, cognitive remediation/compensation, visual/perceptual remediation/compensation, energy conservation, coping strategies training, DME and/or AE instructions, and Re-evaluation  RECOMMENDED OTHER SERVICES: none at this time  CONSULTED AND AGREED WITH PLAN OF CARE: Patient and family member/caregiver  PLAN FOR NEXT SESSION: ADL strategies, fastening buttons, donning jacket, handwriting with foam grip-issue and review handout for writing with PD (have pt wear reading gls if he has them)  Dennis Bast, OT 12/24/2022, 4:06 PM

## 2022-12-29 ENCOUNTER — Encounter: Payer: Self-pay | Admitting: Physical Therapy

## 2022-12-29 ENCOUNTER — Ambulatory Visit: Payer: Medicare Other | Admitting: Physical Therapy

## 2022-12-29 DIAGNOSIS — R2681 Unsteadiness on feet: Secondary | ICD-10-CM

## 2022-12-29 DIAGNOSIS — R29818 Other symptoms and signs involving the nervous system: Secondary | ICD-10-CM

## 2022-12-29 DIAGNOSIS — M6281 Muscle weakness (generalized): Secondary | ICD-10-CM

## 2022-12-29 DIAGNOSIS — R293 Abnormal posture: Secondary | ICD-10-CM

## 2022-12-29 DIAGNOSIS — R278 Other lack of coordination: Secondary | ICD-10-CM

## 2022-12-29 NOTE — Patient Instructions (Signed)
Tips to reduce freezing episodes with standing or walking:  Stand tall with your feet wide, so that you can rock and weight shift through your hips. Don't try to fight the freeze: if you begin taking slower, faster, smaller steps, STOP, get your posture tall, and RESET your posture and balance.  Take a deep breath before taking the BIG step to start again. March in place, with high knee stepping, to get started walking again. Use auditory cues:  Count out loud, think of a familiar tune or song or cadence, use pocket metronome, to use rhythm to get started walking again. Use visual cues:  Use a line to step over, use laser pointer line to step over, (using BIG steps) to start walking again. Use visual targets to keep your posture tall (look ahead and focus on an object or target at eye level). As you approach where your destination with walking, count your steps out loud and/or focus on your target with your eyes until you are fully there. Use appropriate assistive device, as advised by your physical therapist to assist with taking longer, consistent steps.

## 2022-12-29 NOTE — Therapy (Signed)
OUTPATIENT PHYSICAL THERAPY NEURO TREATMENT   Patient Name: Edward Briggs MRN: [id number] DOB:[birth year], 72 y.o., male Today's Date: 12/29/2022   PCP: [name], MD  REFERRING PROVIDER:  [name], MD  END OF SESSION:  PT End of Session - 12/29/22 1457     Visit Number 8    Number of Visits 17    Date for PT Re-Evaluation 02/02/23    Authorization Type [employer]    PT Start Time 1455    PT Stop Time 1537    PT Time Calculation (min) 42 min    Equipment Utilized During Treatment Gait belt    Activity Tolerance Patient tolerated treatment well    Behavior During Therapy WFL for tasks assessed/performed                   Past Medical History:  Diagnosis Date   Diabetes mellitus without complication ([place])    GERD (gastroesophageal reflux disease)    no meds   Hypertension    controlled on meds   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   Past Surgical History:  Procedure Laterality Date   ABSCESS DRAINAGE     COLONOSCOPY     ~ 10 yrs ago   [place]  03/26/2012   Procedure: HARDWARE REMOVAL;  Surgeon: [name], MD;  Location: [facility];  Service: Orthopedics;  Laterality: Left;  hardware removal deep left ankle syndesmotic screws only and stress xrays ankle    ORIF ANKLE FRACTURE  12/03/2011   Procedure: OPEN REDUCTION INTERNAL FIXATION (ORIF) ANKLE FRACTURE;  Surgeon: [name], MD;  Location: [facility];  Service: Orthopedics;  Laterality: Left;  left medial malleolus fracture   PILONIDAL CYST EXCISION  ~1982   SKIN GRAFT  ~1970   Lt great toe (graft from Lt thigh)   Patient Active Problem List   Diagnosis Date Noted   HTN (hypertension) 123456   Acute metabolic encephalopathy 123456   AKI (acute kidney injury) ([place]) 07/22/2019   Sepsis ([place]) 07/22/2019   UTI (urinary tract infection) 07/22/2019   Diabetes mellitus without complication ([place]) 123XX123   CHEST PAIN  07/21/2009    ONSET DATE: 12/01/2022   REFERRING DIAG: G20.A1 (ICD-10-CM) - Parkinson's disease without dyskinesia, unspecified whether manifestations fluctuate   THERAPY DIAG:  Muscle weakness (generalized)  Other lack of coordination  Abnormal posture  Other symptoms and signs involving the nervous system  Unsteadiness on feet  Rationale for Evaluation and Treatment: Rehabilitation  SUBJECTIVE:  SUBJECTIVE STATEMENT: No changes, no falls. Reports he has had no episodes of freezing at home.   Pt accompanied by:  Spouse, [name]    PERTINENT HISTORY: PMH: HTN, Diabetes, HLD   Pt saw neurologist [name] on 12/01/22 for memory impairment, per 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 Pt started on Sinemet   PAIN:  Are you having pain? Yes: NPRS scale: 6/10 Pain location: Low Back Pain description: Aching Aggravating factors: Getting up from a sitting position  Relieving factors: Stretches can help loosen it up  PRECAUTIONS: [place]: No  FALLS: Has patient fallen in last 6 months? Yes. Number of falls 2  LIVING ENVIRONMENT: Lives with: lives with their spouse Lives in: House/apartment Stairs: Yes: Internal: 10 steps; on right going up and External: 4 steps; on right going up Has following equipment at home: None  PLOF: Independent  PATIENT GOALS: Wants to get back to normal.   OBJECTIVE:   DIAGNOSTIC FINDINGS: last brain imaging was a CT of the head done in September 2020 and showed some cerebral atrophy some small vessel white matter disease but no acute intracranial pathology.   COGNITION: Overall cognitive status:  Reports some memory  changes.    SENSATION: WFL  COORDINATION: Bradykinesia RLE>LLE    MUSCLE TONE: RLE: Rigidity   POSTURE: rounded shoulders, forward head, posterior pelvic tilt, and flexed trunk    LOWER EXTREMITY MMT:    MMT Right Eval Left Eval  Hip flexion 4+ 4  Hip extension    Hip abduction 5 5  Hip adduction 4+ 4+  Hip internal rotation    Hip external rotation    Knee flexion 5 5  Knee extension 4+ 4+  Ankle dorsiflexion 4+ 4+  Ankle plantarflexion    Ankle inversion    Ankle eversion    (Blank rows = not tested)  All tested in sitting.  TODAY'S TREATMENT:     Ther Ex SciFit multi-peaks level 5 for 8 minutes using BUE/BLEs for neural priming for reciprocal movement, dynamic cardiovascular warmup and increased amplitude of stepping. RPE of 6-7/10   Therapeutic Activity:  Goal Assessment: 5x sit <> stand: 11.6 seconds with no UE support, no retropulsion  Manual TUG: 13.4 seconds with no AD, CGA when turning due to narrow BOS   Reviewed freezing strategies from home - importance of stopping and resetting (pt able to verbalize this), standing tall for improved posture and then rocking to help initiate a bigger movement. Provided handout and reviewed with pt   Educated on importance of larger amplitude movements with PD and needing to think about moving bigger for improved movement patterns.      NMR  Pt performs PWR! Moves in Standing position   PWR! Up for improved posture x10 reps, with initial cues for posture, hip extension, scap retraction, (pt tends to stand with knees flexed and hip flexion), performed x10 reps with sit > stand and standing PWR Up and back to sitting with focus on good technique of sit <> stands and larger amplitude movements   PWR! Rock for improved weight shifting x10 reps, cues to look up at hands   Pt fatigued after performing    GAIT Gait pattern: step through pattern, decreased arm swing- Right, decreased arm swing- Left, decreased stride  length, decreased hip/knee flexion- Right, decreased hip/knee flexion- Left, decreased ankle dorsiflexion- Right, decreased ankle dorsiflexion-  Left, shuffling, festinating, decreased trunk rotation, and poor foot clearance- Right Distance walked: Various Clinic Distances  Assistive device utilized: None Level of assistance: SBA  Comments: Cued for arm swing during gait as if he is using the trekking poles, discussed needing to think about larger movement patterns during gait, like arm swing, now that automatic movements have been affected.   Cued to also think about arm swing at home.   PATIENT EDUCATION: Education details: Continue HEP, results of goals, review freezing techniques and provided handout.  Person educated: Patient and Spouse Education method: Explanation, Demonstration, Tactile cues, Verbal cues, and Handouts Education comprehension: verbalized understanding and needs further education  HOME EXERCISE PROGRAM: Access Code: [email or web address] URL: [email or web address] Date: 12/08/2022 Prepared by: Mickie Bail Plaster  Exercises - Sit to Stand Without Arm Support  - 1 x daily - 7 x weekly - 3 sets - 10 reps - Standing Reach to Opposite Side with Weight Shift  - 1 x daily - 7 x weekly - 3 sets - 10 reps - Seated Windmill Trunk Rotation Stretch  - 1 x daily - 7 x weekly - 3 sets - 10 reps - Step sideways with arms reaching at counter   - 1 x daily - 7 x weekly - 3 sets - 10 reps - Sidelying Thoracic Rotation with Open Book  - 1 x daily - 7 x weekly - 3 sets - 10 reps - Cervical Retraction at Wall  - 1 x daily - 7 x weekly - 3-5 reps - 30 second hold - Sit to stand with band pull-apart   - 1 x daily - 7 x weekly - 3 sets - 10 reps  GOALS: Goals reviewed with patient? Yes  SHORT TERM GOALS: Target date: 01/01/2023  Pt will be independent with PD specific initial HEP in order to build upon functional gains made in therapy. Baseline: Goal status: INITIAL  2.  Pt will  improve MiniBest to 15/28 for decreased fall risk and improvement with compensatory stepping strategies.   Baseline: 12/28 on 2/5 Goal status: REVISED  3.  Pt will verbalize understanding of fall prevention in the home.  Baseline:  Goal status: INITIAL  4.  Pt will verbalize/demonstrate understanding of freezing techniques Baseline: reviewed and provided handout on 12/29/22 Goal status: MET  5.  Pt will improve manual TUG time to 21 seconds or less in order to demo decrease fall risk. Baseline: 27 seconds, 13.4 seconds on 12/29/22 Goal status: MET  6.  Pt will improve 5x sit<>stand to less than or equal to 17.5 sec to demonstrate improved functional strength and transfer efficiency.  Baseline: 20.28 seconds without UE support,  11.6 seconds without UE support on 12/29/22 Goal status: MET   LONG TERM GOALS: Target date: 01/29/2023  Pt will be independent with PD specific final HEP in order to build upon functional gains made in therapy. Baseline:  Goal status: INITIAL  2.  Pt will improve MiniBest to 18/28 for decreased fall risk and improvement with compensatory stepping strategies.   Baseline: 12/28  Goal status: REVISED  3.  Pt will improve 5x sit<>stand to less than or equal to 15 sec to demonstrate improved functional strength and transfer efficiency.  Baseline: 20.28 seconds without UE support; 11.6 seconds without UE support on 12/29/22 Goal status: MET  4.  Pt will improve gait speed with no AD to at least 3.3 ft/sec in order to demo improved community mobility.  Baseline: 11.07 seconds = 2.96 ft/sec Goal status: INITIAL  5.   Pt will improve manual TUG time to 17 seconds or less with supervision in order to demo decrease fall risk. Baseline: 27 seconds  Goal status: INITIAL  6.  Pt will verbalize understanding of community resources for PD.  Baseline:  Goal status: INITIAL  ASSESSMENT:  CLINICAL IMPRESSION: Today's skilled session focused on beginning to check  pt's STGs. Pt has met STGs #3-6. Reviewed freezing strategies and provided handout. Pt improved manual TUG time from 27 seconds > 13.4 seconds, but did need CGA when pt was turning due to narrow BOS. Pt improved 5x sit <> stand with no UE support from 20.28 seconds > 11.6 seconds with no episodes of retropulsion. Remainder of session focused on larger amplitude movements with standing PWR moves and arm swing with gait. Pt fatigued after sit <> stands to PWR Up. Pt tolerated session well, will continue to progress towards LTGs.    OBJECTIVE IMPAIRMENTS: Abnormal gait, decreased activity tolerance, decreased balance, decreased coordination, decreased mobility, difficulty walking, decreased strength, impaired flexibility, impaired tone, and postural dysfunction.   ACTIVITY LIMITATIONS: bending, stairs, transfers, dressing, and locomotion level  PARTICIPATION LIMITATIONS: driving and community activity  PERSONAL FACTORS: Behavior pattern, Past/current experiences, Time since onset of injury/illness/exacerbation, and 3+ comorbidities: HTN, Diabetes, HLD, newly diagnosed PD  are also affecting patient's functional outcome.   REHAB POTENTIAL: Good  CLINICAL DECISION MAKING: Evolving/moderate complexity  EVALUATION COMPLEXITY: Moderate  PLAN:  PT FREQUENCY: 2x/week  PT DURATION: 8 weeks  PLANNED INTERVENTIONS: Therapeutic exercises, Therapeutic activity, Neuromuscular re-education, Balance training, Gait training, Patient/Family education, Self Care, Stair training, Vestibular training, DME instructions, and Re-evaluation  PLAN FOR NEXT SESSION: check remainder of STGs, work on standing/seated PWR moves. balance deficits found on miniBEST. Blaze pods with trunk rotation at mirror. Gait training with larger amplitude movements and arm swing. WORK ON TURNS!!!!, UNLEVEL SURFACES, resisted gait, slam ball activities    Arliss Journey, PT, DPT  12/29/2022, 4:12 PM

## 2022-12-31 ENCOUNTER — Ambulatory Visit: Payer: Medicare Other | Admitting: Physical Therapy

## 2022-12-31 ENCOUNTER — Encounter: Payer: Self-pay | Admitting: Physical Therapy

## 2022-12-31 DIAGNOSIS — R293 Abnormal posture: Secondary | ICD-10-CM

## 2022-12-31 DIAGNOSIS — R2681 Unsteadiness on feet: Secondary | ICD-10-CM | POA: Diagnosis not present

## 2022-12-31 DIAGNOSIS — M6281 Muscle weakness (generalized): Secondary | ICD-10-CM

## 2022-12-31 DIAGNOSIS — R29818 Other symptoms and signs involving the nervous system: Secondary | ICD-10-CM

## 2022-12-31 NOTE — Therapy (Signed)
OUTPATIENT PHYSICAL THERAPY NEURO TREATMENT/10TH VISIT PN   Patient Name: Edward Briggs MRN: [id number] DOB:[birth year], 72 y.o., male Today's Date: 12/31/2022   PCP: [name], MD  REFERRING PROVIDER:  [name], MD  10th Visit Physical Therapy Progress Note  Dates of Reporting Period: 12/04/22 to 12/31/22   END OF SESSION:  PT End of Session - 12/31/22 1406     Visit Number 9    Number of Visits 17    Date for PT Re-Evaluation 02/02/23    Authorization Type [employer]    Progress Note Due on Visit 9   PN done on visit 9   PT Start Time 1405    PT Stop Time 1447    PT Time Calculation (min) 42 min    Equipment Utilized During Treatment Gait belt    Activity Tolerance Patient tolerated treatment well    Behavior During Therapy WFL for tasks assessed/performed                   Past Medical History:  Diagnosis Date   Diabetes mellitus without complication ([place])    GERD (gastroesophageal reflux disease)    no meds   Hypertension    controlled on meds   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   Past Surgical History:  Procedure Laterality Date   ABSCESS DRAINAGE     COLONOSCOPY     ~ 10 yrs ago   [place]  03/26/2012   Procedure: HARDWARE REMOVAL;  Surgeon: [name], MD;  Location: [facility];  Service: Orthopedics;  Laterality: Left;  hardware removal deep left ankle syndesmotic screws only and stress xrays ankle    ORIF ANKLE FRACTURE  12/03/2011   Procedure: OPEN REDUCTION INTERNAL FIXATION (ORIF) ANKLE FRACTURE;  Surgeon: [name], MD;  Location: [facility];  Service: Orthopedics;  Laterality: Left;  left medial malleolus fracture   PILONIDAL CYST EXCISION  ~1982   SKIN GRAFT  ~1970   Lt great toe (graft from Lt thigh)   Patient Active Problem List   Diagnosis Date Noted   HTN (hypertension) 123456   Acute metabolic encephalopathy 123456   AKI (acute kidney injury)  ([place]) 07/22/2019   Sepsis ([place]) 07/22/2019   UTI (urinary tract infection) 07/22/2019   Diabetes mellitus without complication ([place]) 123XX123   CHEST PAIN 07/21/2009    ONSET DATE: 12/01/2022   REFERRING DIAG: G20.A1 (ICD-10-CM) - Parkinson's disease without dyskinesia, unspecified whether manifestations fluctuate   THERAPY DIAG:  Muscle weakness (generalized)  Abnormal posture  Other symptoms and signs involving the nervous system  Unsteadiness on feet  Rationale for Evaluation and Treatment: Rehabilitation  SUBJECTIVE:  SUBJECTIVE STATEMENT: Nothing new, no falls.   Pt accompanied by:  Spouse, Edward Briggs    PERTINENT HISTORY: PMH: HTN, Diabetes, HLD   Pt saw neurologist [name] on 12/01/22 for memory impairment, per 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 Pt started on Sinemet   PAIN:  Are you having pain? Yes: NPRS scale: 6/10 Pain location: Low Back Pain description: Aching Aggravating factors: Getting up from a sitting position  Relieving factors: Stretches can help loosen it up  PRECAUTIONS: [place]: No  FALLS: Has patient fallen in last 6 months? Yes. Number of falls 2  LIVING ENVIRONMENT: Lives with: lives with their spouse Lives in: House/apartment Stairs: Yes: Internal: 10 steps; on right going up and External: 4 steps; on right going up Has following equipment at home: None  PLOF: Independent  PATIENT GOALS: Wants to get back to normal.   OBJECTIVE:   DIAGNOSTIC FINDINGS: last brain imaging was a CT of the head done in September 2020 and showed some cerebral atrophy some small vessel white matter disease but no acute  intracranial pathology.   COGNITION: Overall cognitive status:  Reports some memory changes.    SENSATION: WFL  COORDINATION: Bradykinesia RLE>LLE    MUSCLE TONE: RLE: Rigidity   POSTURE: rounded shoulders, forward head, posterior pelvic tilt, and flexed trunk    LOWER EXTREMITY MMT:    MMT Right Eval Left Eval  Hip flexion 4+ 4  Hip extension    Hip abduction 5 5  Hip adduction 4+ 4+  Hip internal rotation    Hip external rotation    Knee flexion 5 5  Knee extension 4+ 4+  Ankle dorsiflexion 4+ 4+  Ankle plantarflexion    Ankle inversion    Ankle eversion    (Blank rows = not tested)  All tested in sitting.  TODAY'S TREATMENT:     Therapeutic Activity:  Goal Assessment:  [facility] PT Assessment - 12/31/22 1408       Mini-BESTest   Sit To Stand Normal: Comes to stand without use of hands and stabilizes independently.    Rise to Toes Moderate: Heels up, but not full range (smaller than when holding hands), OR noticeable instability for 3 s.    Stand on one leg (left) Moderate: < 20 s   1-2 seconds   Stand on one leg (right) Moderate: < 20 s   1-2 seconds   Stand on one leg - lowest score 1    Compensatory Stepping Correction - Forward No step, OR would fall if not caught, OR falls spontaneously.   No step, pt with incr forward flexed posture, needing assist from therapist   Compensatory Stepping Correction - Backward No step, OR would fall if not caught, OR falls spontaneously.   no step   Compensatory Stepping Correction - Left Lateral Severe: Falls, or cannot step   no step, unable to facilitate   Compensatory Stepping Correction - Right Lateral Severe:  Falls, or cannot step   no step, unable to facilitate   Stepping Corredtion Lateral - lowest score 0    Stance - Feet together, eyes open, firm surface  Normal: 30s    Stance - Feet together, eyes closed, foam surface  Moderate: < 30s   20 seconds   Incline - Eyes Closed Moderate: Stands independently < 30s OR  aligns with surface   forward lean  Change in Gait Speed Moderate: Unable to change walking speed or signs of imbalance    Walk with head turns - Horizontal Normal: performs head turns with no change in gait speed and good balance    Walk with pivot turns Moderate:Turns with feet close SLOW (>4 steps) with good balance.    Step over obstacles Normal: Able to step over box with minimal change of gait speed and with good balance.    Timed UP & GO with Dual Task Moderate: Dual Task affects either counting OR walking (>10%) when compared to the TUG without Dual Task.    Mini-BEST total score 15      Timed Up and Go Test   Normal TUG (seconds) 16.6    Cognitive TUG (seconds) 25.1   from 100, counts backwards by 3s and 1s                NMR  Pt performs PWR! Moves in Supine position:    PWR! Up for improved posture x10 reps through chest, x10 reps through hips for bridging, with pt with limited ROM   PWR! Twist for improved trunk rotation - with pt holding boomwhackers and legs in hooklying position, multi-modal cues for technique and larger amplitude movements when twisting, PT needing to give cues throughout for incr effort and manual cues at legs to help with rotation, cues to [facility] boomwhackers hard, pt more challenged rolling towards the R side. Performed x8 reps each side   Cues provided for technique and larger amplitude movement. Pt more fatigued after bridging   With bed mobility, pt taking incr time and needing verbal/demo cues on proper technique on how to scoot to get towards middle of mat table.  When getting out of bed, cued for PWR Twist to roll and then come up to sidelying   GAIT Gait pattern: step through pattern, decreased arm swing- Right, decreased arm swing- Left, decreased stride length, decreased hip/knee flexion- Right, decreased hip/knee flexion- Left, decreased ankle dorsiflexion- Right, decreased ankle dorsiflexion- Left, shuffling, festinating, decreased  trunk rotation, and poor foot clearance- Right Distance walked: Various Clinic Distances  Assistive device utilized: None Level of assistance: SBA  Comments: Cued for arm swing during gait as if he is using the trekking poles, discussed needing to think about larger movement patterns during gait, like arm swing, now that automatic movements have been affected.   Cued to also think about arm swing at home.   PATIENT EDUCATION: Education details: Continue HEP, results of miniBEST  Person educated: Patient and Spouse Education method: Explanation, Demonstration, Tactile cues, and Verbal cues Education comprehension: verbalized understanding and needs further education  HOME EXERCISE PROGRAM: Access Code: [phone] URL: [email or web address] Date: 12/08/2022 Prepared by: Mickie Bail Plaster  Exercises - Sit to Stand Without Arm Support  - 1 x daily - 7 x weekly - 3 sets - 10 reps - Standing Reach to Opposite Side with Weight Shift  - 1 x daily - 7 x weekly - 3 sets - 10 reps - Seated Windmill Trunk Rotation Stretch  - 1 x daily - 7 x weekly - 3 sets - 10 reps - Step sideways with arms reaching at counter   - 1 x daily - 7 x weekly - 3 sets - 10 reps - Sidelying Thoracic Rotation with Open Book  - 1 x daily - 7 x weekly - 3 sets - 10 reps - Cervical Retraction at Wall  - 1 x daily - 7 x weekly - 3-5  reps - 30 second hold - Sit to stand with band pull-apart   - 1 x daily - 7 x weekly - 3 sets - 10 reps  GOALS: Goals reviewed with patient? Yes  SHORT TERM GOALS: Target date: 01/01/2023  Pt will be independent with PD specific initial HEP in order to build upon functional gains made in therapy. Baseline: pt reports performing consistently at home.  Goal status: MET  2.  Pt will improve MiniBest to 15/28 for decreased fall risk and improvement with compensatory stepping strategies.   Baseline: 12/28 on 2/5; 15/28 on 12/31/22 Goal status: MET  3.  Pt will verbalize  understanding of fall prevention in the home.  Baseline:  Goal status: INITIAL  4.  Pt will verbalize/demonstrate understanding of freezing techniques Baseline: reviewed and provided handout on 12/29/22 Goal status: MET  5.  Pt will improve manual TUG time to 21 seconds or less in order to demo decrease fall risk. Baseline: 27 seconds, 13.4 seconds on 12/29/22 Goal status: MET  6.  Pt will improve 5x sit<>stand to less than or equal to 17.5 sec to demonstrate improved functional strength and transfer efficiency.  Baseline: 20.28 seconds without UE support,  11.6 seconds without UE support on 12/29/22 Goal status: MET   LONG TERM GOALS: Target date: 01/29/2023  Pt will be independent with PD specific final HEP in order to build upon functional gains made in therapy. Baseline:  Goal status: INITIAL  2.  Pt will improve MiniBest to 18/28 for decreased fall risk and improvement with compensatory stepping strategies.   Baseline: 12/28  Goal status: REVISED  3.  Pt will improve 5x sit<>stand to less than or equal to 15 sec to demonstrate improved functional strength and transfer efficiency.  Baseline: 20.28 seconds without UE support; 11.6 seconds without UE support on 12/29/22 Goal status: MET  4.  Pt will improve gait speed with no AD to at least 3.3 ft/sec in order to demo improved community mobility.  Baseline: 11.07 seconds = 2.96 ft/sec Goal status: INITIAL  5.   Pt will improve manual TUG time to 17 seconds or less with supervision in order to demo decrease fall risk. Baseline: 27 seconds  Goal status: INITIAL  6.  Pt will verbalize understanding of community resources for PD.  Baseline:  Goal status: INITIAL  ASSESSMENT:  CLINICAL IMPRESSION: 10th visit PN: Performed the miniBEST today with pt scoring a 15/28 (improved from 12/28), but still at a significant fall risk. Pt with no reactive stepping strategies for balance. Needing assist from therapist to prevent a fall in  the forwards direction. Remainder of goals checked in previous session, pt had met (see STGs above), with improvements in TUG, 5x sit <> stand, and freezing strategies. Remainder of session focused on supine PWR moves for posture and trunk rotation. Pt with significant difficulty with PWR Twist and needing manual assist from therapist at legs to help with trunk rotation, esp to the R. Will continue to progress towards LTGs.    OBJECTIVE IMPAIRMENTS: Abnormal gait, decreased activity tolerance, decreased balance, decreased coordination, decreased mobility, difficulty walking, decreased strength, impaired flexibility, impaired tone, and postural dysfunction.   ACTIVITY LIMITATIONS: bending, stairs, transfers, dressing, and locomotion level  PARTICIPATION LIMITATIONS: driving and community activity  PERSONAL FACTORS: Behavior pattern, Past/current experiences, Time since onset of injury/illness/exacerbation, and 3+ comorbidities: HTN, Diabetes, HLD, newly diagnosed PD  are also affecting patient's functional outcome.   REHAB POTENTIAL: Good  CLINICAL DECISION MAKING: Evolving/moderate complexity  EVALUATION COMPLEXITY: Moderate  PLAN:  PT FREQUENCY: 2x/week  PT DURATION: 8 weeks  PLANNED INTERVENTIONS: Therapeutic exercises, Therapeutic activity, Neuromuscular re-education, Balance training, Gait training, Patient/Family education, Self Care, Stair training, Vestibular training, DME instructions, and Re-evaluation  PLAN FOR NEXT SESSION: balance deficits found on miniBEST - esp stepping strategies! Trial a rollator?? Gait training with larger amplitude movements and arm swing. WORK ON TURNS!!!!, UNLEVEL SURFACES, resisted gait, slam ball activities    Arliss Journey, PT, DPT  12/31/2022, 2:58 PM

## 2023-01-05 ENCOUNTER — Ambulatory Visit: Payer: Medicare Other | Attending: Neurology | Admitting: Physical Therapy

## 2023-01-05 DIAGNOSIS — R2681 Unsteadiness on feet: Secondary | ICD-10-CM | POA: Insufficient documentation

## 2023-01-05 DIAGNOSIS — M6281 Muscle weakness (generalized): Secondary | ICD-10-CM | POA: Insufficient documentation

## 2023-01-05 DIAGNOSIS — Z9181 History of falling: Secondary | ICD-10-CM | POA: Diagnosis present

## 2023-01-05 DIAGNOSIS — R29818 Other symptoms and signs involving the nervous system: Secondary | ICD-10-CM | POA: Diagnosis present

## 2023-01-05 DIAGNOSIS — R278 Other lack of coordination: Secondary | ICD-10-CM | POA: Insufficient documentation

## 2023-01-05 DIAGNOSIS — R41842 Visuospatial deficit: Secondary | ICD-10-CM | POA: Diagnosis present

## 2023-01-05 DIAGNOSIS — R41840 Attention and concentration deficit: Secondary | ICD-10-CM | POA: Insufficient documentation

## 2023-01-05 DIAGNOSIS — R293 Abnormal posture: Secondary | ICD-10-CM | POA: Insufficient documentation

## 2023-01-05 NOTE — Therapy (Signed)
OUTPATIENT PHYSICAL THERAPY NEURO TREATMENT    Patient Name: Edward Briggs MRN: [id number] DOB:15-Jun-1951, 72 y.o., male Today's Date: 01/05/2023   PCP: [name], MD  REFERRING PROVIDER:  [name], MD    END OF SESSION:  PT End of Session - 01/05/23 1406     Visit Number 10    Number of Visits 17    Date for PT Re-Evaluation 02/02/23    Authorization Type [employer] Medicare    Progress Note Due on Visit 9   PN done on visit 9   PT Start Time 1404    PT Stop Time 1447    PT Time Calculation (min) 43 min    Equipment Utilized During Treatment Gait belt    Activity Tolerance Patient tolerated treatment well    Behavior During Therapy WFL for tasks assessed/performed                   Past Medical History:  Diagnosis Date   Diabetes mellitus without complication ([place])    GERD (gastroesophageal reflux disease)    no meds   Hypertension    controlled on meds   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   Past Surgical History:  Procedure Laterality Date   ABSCESS DRAINAGE     COLONOSCOPY     ~ 10 yrs ago   [place]  03/26/2012   Procedure: HARDWARE REMOVAL;  Surgeon: [name], MD;  Location: [facility];  Service: Orthopedics;  Laterality: Left;  hardware removal deep left ankle syndesmotic screws only and stress xrays ankle    ORIF ANKLE FRACTURE  12/03/2011   Procedure: OPEN REDUCTION INTERNAL FIXATION (ORIF) ANKLE FRACTURE;  Surgeon: [name], MD;  Location: [facility];  Service: Orthopedics;  Laterality: Left;  left medial malleolus fracture   PILONIDAL CYST EXCISION  ~1982   SKIN GRAFT  ~1970   Lt great toe (graft from Lt thigh)   Patient Active Problem List   Diagnosis Date Noted   HTN (hypertension) 123456   Acute metabolic encephalopathy 123456   AKI (acute kidney injury) ([place]) 07/22/2019   Sepsis ([place]) 07/22/2019   UTI (urinary tract infection) 07/22/2019   Diabetes  mellitus without complication ([place]) 123XX123   CHEST PAIN 07/21/2009    ONSET DATE: 12/01/2022   REFERRING DIAG: G20.A1 (ICD-10-CM) - Parkinson's disease without dyskinesia, unspecified whether manifestations fluctuate   THERAPY DIAG:  Unsteadiness on feet  Muscle weakness (generalized)  Abnormal posture  Rationale for Evaluation and Treatment: Rehabilitation  SUBJECTIVE:  SUBJECTIVE STATEMENT: Pt reports doing well, no acute changes. Having increased back pain today.   Pt accompanied by:  Spouse, [name]    PERTINENT HISTORY: PMH: HTN, Diabetes, HLD   Pt saw neurologist [name] on 12/01/22 for memory impairment, per 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 Pt started on Sinemet   PAIN:  Are you having pain? Yes: NPRS scale: 7/10 Pain location: Low Back Pain description: Aching Aggravating factors: Getting up from a sitting position  Relieving factors: Stretches can help loosen it up  PRECAUTIONS: [place]: No  FALLS: Has patient fallen in last 6 months? Yes. Number of falls 2  LIVING ENVIRONMENT: Lives with: lives with their spouse Lives in: House/apartment Stairs: Yes: Internal: 10 steps; on right going up and External: 4 steps; on right going up Has following equipment at home: None  PLOF: Independent  PATIENT GOALS: Wants to get back to normal.   OBJECTIVE:   DIAGNOSTIC FINDINGS: last brain imaging was a CT of the head done in September 2020 and showed some cerebral atrophy some small vessel white matter disease but no acute intracranial pathology.   COGNITION: Overall cognitive status:  Reports some memory changes.     SENSATION: WFL  COORDINATION: Bradykinesia RLE>LLE    MUSCLE TONE: RLE: Rigidity   POSTURE: rounded shoulders, forward head, posterior pelvic tilt, and flexed trunk    LOWER EXTREMITY MMT:    MMT Right Eval Left Eval  Hip flexion 4+ 4  Hip extension    Hip abduction 5 5  Hip adduction 4+ 4+  Hip internal rotation    Hip external rotation    Knee flexion 5 5  Knee extension 4+ 4+  Ankle dorsiflexion 4+ 4+  Ankle plantarflexion    Ankle inversion    Ankle eversion    (Blank rows = not tested)  All tested in sitting.  TODAY'S TREATMENT:    Ther Ex  Supine LTRs, x4 per side w/20s hold for improved spinal mobility, pain modulation and lateral weight shift. Pt w/significant difficulty rotating to R side, requiring min A to stabilize. Added to HEP (see bolded below) as pt reported pain relief w/activity   Gait Training  GAIT Gait pattern: step through pattern, decreased arm swing- Right, decreased arm swing- Left, decreased stride length, decreased hip/knee flexion- Right, decreased hip/knee flexion- Left, decreased ankle dorsiflexion- Right, decreased ankle dorsiflexion- Left, shuffling, festinating, decreased trunk rotation, and poor foot clearance- Right Distance walked: 10' indoors and >400' outdoors on sidewalk  Assistive device utilized: Environmental consultant - 4 wheeled Level of assistance: SBA  Comments: Practiced use of rollator for improved postural control, step length and safety. Min verbal cues to maintain safe distance to rollator throughout to promote upright posture. Noted increased step length with use of rollator and pt reported feeling more stable. However, pt continues to demonstrate forward flexed posture that is exaggerated by rollator at times, requiring cues to stand upright.   NMR  In // bars for improved stepping strategies and postural control: On rockerboard in A/P direction, standing unsupported for 2 minutes. Pt assumes forward flexed position to combat  posterior lean preference and quickly reported low back pain. Mod tactile cues provided to anterior shoulder and pelvis to promote upright posture and pt reported decrease in pain. Educated pt on being more aware of posture w/ambulation for reduced low back pain and improved gait kinematics.  Pt verbalized understanding. Progressed to alt retro step off rockerboard, 2x7 per side, w/o UE support. Pt initially demonstrating significant forward lean to weight shift, requiring max A to maintain balance on board and pt falling backwards without awareness and did not grab onto bars to correct balance. Provided mod tactile cues throughout remainder of intervention to promote lateral weight shift while maintaining upright posture. Pt unable to fully step foot off of board despite cues, resulting in tipping the board posteriorly and forward trunk lean to stabilize.    PATIENT EDUCATION: Education details: Additions to HEP, importance of posture  Person educated: Patient and Spouse Education method: Explanation, Demonstration, Tactile cues, and Verbal cues Education comprehension: verbalized understanding, tactile cues required, and needs further education  HOME EXERCISE PROGRAM: Access Code: [email or web address] URL: [email or web address] Date: 12/08/2022 Prepared by: Mickie Bail Vernel Langenderfer  Exercises - Sit to Stand Without Arm Support  - 1 x daily - 7 x weekly - 3 sets - 10 reps - Standing Reach to Opposite Side with Weight Shift  - 1 x daily - 7 x weekly - 3 sets - 10 reps - Seated Windmill Trunk Rotation Stretch  - 1 x daily - 7 x weekly - 3 sets - 10 reps - Step sideways with arms reaching at counter   - 1 x daily - 7 x weekly - 3 sets - 10 reps - Sidelying Thoracic Rotation with Open Book  - 1 x daily - 7 x weekly - 3 sets - 10 reps - Cervical Retraction at Wall  - 1 x daily - 7 x weekly - 3-5 reps - 30 second hold - Sit to stand with band pull-apart   - 1 x daily - 7 x weekly - 3 sets - 10 reps - Lower  Trunk Rotation Stretch  - 1 x daily - 7 x weekly - 4-5 reps - 20-30 second hold  GOALS: Goals reviewed with patient? Yes  SHORT TERM GOALS: Target date: 01/01/2023  Pt will be independent with PD specific initial HEP in order to build upon functional gains made in therapy. Baseline: pt reports performing consistently at home.  Goal status: MET  2.  Pt will improve MiniBest to 15/28 for decreased fall risk and improvement with compensatory stepping strategies.   Baseline: 12/28 on 2/5; 15/28 on 12/31/22 Goal status: MET  3.  Pt will verbalize understanding of fall prevention in the home.  Baseline:  Goal status: INITIAL  4.  Pt will verbalize/demonstrate understanding of freezing techniques Baseline: reviewed and provided handout on 12/29/22 Goal status: MET  5.  Pt will improve manual TUG time to 21 seconds or less in order to demo decrease fall risk. Baseline: 27 seconds, 13.4 seconds on 12/29/22 Goal status: MET  6.  Pt will improve 5x sit<>stand to less than or equal to 17.5 sec to demonstrate improved functional strength and transfer efficiency.  Baseline: 20.28 seconds without UE support,  11.6 seconds without UE support on 12/29/22 Goal status: MET   LONG TERM GOALS: Target date: 01/29/2023  Pt will be independent with PD specific final HEP in order to build upon functional gains made in therapy. Baseline:  Goal status: INITIAL  2.  Pt will improve MiniBest to 18/28 for decreased fall risk and improvement with compensatory stepping strategies.   Baseline: 12/28  Goal status: REVISED  3.  Pt will improve 5x sit<>stand to less than or equal to 15 sec to demonstrate improved functional strength and transfer efficiency.  Baseline: 20.28 seconds without  UE support; 11.6 seconds without UE support on 12/29/22 Goal status: MET  4.  Pt will improve gait speed with no AD to at least 3.3 ft/sec in order to demo improved community mobility.  Baseline: 11.07 seconds = 2.96  ft/sec Goal status: INITIAL  5.   Pt will improve manual TUG time to 17 seconds or less with supervision in order to demo decrease fall risk. Baseline: 27 seconds  Goal status: INITIAL  6.  Pt will verbalize understanding of community resources for PD.  Baseline:  Goal status: INITIAL  ASSESSMENT:  CLINICAL IMPRESSION: Emphasis of skilled PT session on gait training w/rollator, postural control and stepping strategies. Pt continues to report high levels of low back pain, exacerbated w/standing or ambulation. Pt assumes very forward flexed posture that is most likely related to this pain, as pt's pain increases during session when he is bent forward. Pt requires mod tactile cues to promote upright posture, even w/use of rollator. Pt would benefit from anterior weight shift practice when in upright posture, as he tends to lean posteriorly, resulting in forward lean compensation. Continue POC.    OBJECTIVE IMPAIRMENTS: Abnormal gait, decreased activity tolerance, decreased balance, decreased coordination, decreased mobility, difficulty walking, decreased strength, impaired flexibility, impaired tone, and postural dysfunction.   ACTIVITY LIMITATIONS: bending, stairs, transfers, dressing, and locomotion level  PARTICIPATION LIMITATIONS: driving and community activity  PERSONAL FACTORS: Behavior pattern, Past/current experiences, Time since onset of injury/illness/exacerbation, and 3+ comorbidities: HTN, Diabetes, HLD, newly diagnosed PD  are also affecting patient's functional outcome.   REHAB POTENTIAL: Good  CLINICAL DECISION MAKING: Evolving/moderate complexity  EVALUATION COMPLEXITY: Moderate  PLAN:  PT FREQUENCY: 2x/week  PT DURATION: 8 weeks  PLANNED INTERVENTIONS: Therapeutic exercises, Therapeutic activity, Neuromuscular re-education, Balance training, Gait training, Patient/Family education, Self Care, Stair training, Vestibular training, DME instructions, and  Re-evaluation  PLAN FOR NEXT SESSION: balance deficits found on miniBEST - esp stepping strategies! Rollator worked okay- was not significant enough to recommend it.  Gait training with larger amplitude movements and arm swing. WORK ON TURNS!!!!, UNLEVEL SURFACES, resisted gait, slam ball activities, anterior weight shifting    Cruzita Lederer Eulogia Dismore, PT, DPT  01/05/2023, 2:48 PM

## 2023-01-07 ENCOUNTER — Encounter: Payer: Self-pay | Admitting: Occupational Therapy

## 2023-01-07 ENCOUNTER — Ambulatory Visit: Payer: Medicare Other | Admitting: Occupational Therapy

## 2023-01-07 ENCOUNTER — Encounter: Payer: Self-pay | Admitting: Physical Therapy

## 2023-01-07 ENCOUNTER — Ambulatory Visit: Payer: Medicare Other | Admitting: Physical Therapy

## 2023-01-07 DIAGNOSIS — R41842 Visuospatial deficit: Secondary | ICD-10-CM

## 2023-01-07 DIAGNOSIS — R2681 Unsteadiness on feet: Secondary | ICD-10-CM

## 2023-01-07 DIAGNOSIS — R29818 Other symptoms and signs involving the nervous system: Secondary | ICD-10-CM

## 2023-01-07 DIAGNOSIS — R41840 Attention and concentration deficit: Secondary | ICD-10-CM

## 2023-01-07 DIAGNOSIS — Z9181 History of falling: Secondary | ICD-10-CM

## 2023-01-07 DIAGNOSIS — M6281 Muscle weakness (generalized): Secondary | ICD-10-CM

## 2023-01-07 DIAGNOSIS — R293 Abnormal posture: Secondary | ICD-10-CM

## 2023-01-07 DIAGNOSIS — R278 Other lack of coordination: Secondary | ICD-10-CM

## 2023-01-07 NOTE — Therapy (Signed)
OUTPATIENT OCCUPATIONAL THERAPY PARKINSON'S TREATMENT  Patient Name: Edward Briggs MRN: [id number] DOB:1951/07/25, 72 y.o., male Today's Date: 01/07/2023  PCP: [name], MD  REFERRING PROVIDER: [name], MD   END OF SESSION:  OT End of Session - 01/07/23 1451     Visit Number 4    Number of Visits 25    Date for OT Re-Evaluation 03/06/23    Authorization Type [employer] Medicare    Progress Note Due on Visit 10    OT Start Time 1450    OT Stop Time 1530    OT Time Calculation (min) 40 min    Activity Tolerance Patient tolerated treatment well    Behavior During Therapy WFL for tasks assessed/performed            Past Medical History:  Diagnosis Date   Diabetes mellitus without complication ([place])    GERD (gastroesophageal reflux disease)    no meds   Hypertension    controlled on meds   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   Past Surgical History:  Procedure Laterality Date   ABSCESS DRAINAGE     COLONOSCOPY     ~ 10 yrs ago   [place]  03/26/2012   Procedure: HARDWARE REMOVAL;  Surgeon: [name], MD;  Location: [facility];  Service: Orthopedics;  Laterality: Left;  hardware removal deep left ankle syndesmotic screws only and stress xrays ankle    ORIF ANKLE FRACTURE  12/03/2011   Procedure: OPEN REDUCTION INTERNAL FIXATION (ORIF) ANKLE FRACTURE;  Surgeon: [name], MD;  Location: [facility];  Service: Orthopedics;  Laterality: Left;  left medial malleolus fracture   PILONIDAL CYST EXCISION  ~1982   SKIN GRAFT  ~1970   Lt great toe (graft from Lt thigh)   Patient Active Problem List   Diagnosis Date Noted   HTN (hypertension) 123456   Acute metabolic encephalopathy 123456   AKI (acute kidney injury) ([place]) 07/22/2019   Sepsis ([name]) 07/22/2019   UTI (urinary tract infection) 07/22/2019   Diabetes mellitus without complication ([place]) 123XX123   CHEST PAIN 07/21/2009    ONSET DATE:  12/01/2022  REFERRING DIAG: G20.A1 (ICD-10-CM) - Parkinson's disease without dyskinesia, unspecified whether manifestations fluctuate   THERAPY DIAG:  Muscle weakness (generalized)  Abnormal posture  Unsteadiness on feet  Other symptoms and signs involving the nervous system  Other lack of coordination  Visuospatial deficit  Attention and concentration deficit  History of falling  Rationale for Evaluation and Treatment: Rehabilitation  SUBJECTIVE:   SUBJECTIVE STATEMENT: He brought his reading glasses today. He does not have any issue with buttoning shirts at home when the shirt is on him.   Pt accompanied by: significant other - Edward Briggs  PERTINENT HISTORY:   Per PT eval, " PMH: HTN, Diabetes, HLD    Pt saw neurologist [name] on 12/01/22 for memory impairment, per 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 Pt started on Sinemet.  Noticed a difference in his walking about 4-6 weeks ago. Was started on Sinemet and started taking it (1/31). Has been taking small, shuffled steps. Notices that he will lean forward at times. Getting up from a chair is getting more difficult. Has had 2 falls - one he was getting up and his lost balance, 2nd fall was down the stairs and missed a step."  PRECAUTIONS: Fall  WEIGHT BEARING RESTRICTIONS: No  PAIN:  Are you having pain? No  FALLS: Has patient fallen in last 6 months? Yes. Number of falls 2  LIVING ENVIRONMENT: Lives with: lives with their spouse Lives in: House/apartment Stairs: Yes: Internal: 10 steps; on right going up and External: 4 steps; on right going up Has following equipment at home: None   PLOF: Independent   PATIENT GOALS: Wants to get back to normal.   OBJECTIVE:   HAND DOMINANCE: Right  ADLs: Transfers/ambulation related to ADLs: Eating: mod I Grooming: I UB Dressing: min A LB Dressing:  mod A Toileting: min A with buttoning pants Bathing: Distance supervision  Tub Shower transfers: distance supervision Equipment: Careers information officer [place]  IADLs: Shopping: same as PLOF Light housekeeping: mod I Meal Prep: light meal prep, has only been cooking breakfast Community mobility: driving, short distance, no highway Medication management: min A Financial management: I Handwriting: 25% legible  MOBILITY STATUS: Needs Assist: SBA  ACTIVITY TOLERANCE: Activity tolerance: good  FUNCTIONAL OUTCOME MEASURES: Fastening/unfastening 3 buttons: 1:55  Physical performance test: PPT#2 (simulated eating) 27 seconds R = 1 & PPT#4 (donning/doffing jacket): 27 seconds = 1  COORDINATION: 9 Hole Peg test: Right: 55 sec; Left: 42 sec Box and Blocks:  Right 26 blocks, Left 31blocks  Grip strength: 73.4 lbs R ; 74.5 lbs L  UE ROM:  WFL  UE MMT:   WFL  SENSATION: WFL  MUSCLE TONE: RUE: Moderate more prominent in shoulder and LUE: Mild more prominent in elbow  COGNITION: Overall cognitive status: Impaired - impaired motor planning, processing,  OBSERVATIONS: Bradykinesia and Pill rolling L hand;  Difficulty following commands  TODAY'S TREATMENT:                                                                                                       - Therapeutic activities completed for duration as noted below including: OT educated pt and wife on using larger movements for buttoning and unbuttoning shirt.  Pt practiced writing with dry erase board, larger marker, and use of reading glasses with significant improvement noted.  OT placed 6 BlazePods in front of patient and had patient tap pods with palm of hand as pods lit up using random mode for improved processing, scanning and locating of items, reaction time, upper extremity range of motion, gross motor coordination.  Hits were as follows: RUE: 33 hits, 1651 ms (with readers on); 51 hits, 1002 ms LUE: 39 hits, 1369 ms (with readers  on)  PATIENT EDUCATION: Education details: BIG movements; use of readers for near tasks and near tasks only Person educated: Patient and Spouse Education method: Customer service manager, verbal cues, demonstration Education comprehension: verbalized understanding, returned demonstration, and needs further education  HOME EXERCISE PROGRAM: 12/22/22 coordination HEP  GOALS:  SHORT TERM GOALS: Target date: 01/05/2023    Pt will be independent with PD specific HEP.  Baseline: not yet initiated Goal status: IN PROGRESS  2.  Pt will verbalize understanding of adapted strategies to maximize safety and independence with ADLs/IADLs.  Baseline: not yet  initiated Goal status: INITIAL  3.  Pt will write a sentence with no significant decrease in size and maintain 75% legibility.  Baseline: 25% legibility Goal status: INITIAL  4.  Pt will demonstrate improved ease with fastening buttons as evidenced by decreasing 3 button/unbutton time by 5 seconds  Baseline: 1 minute 55 seconds Goal status: INITIAL    LONG TERM GOALS: Target date: 03/06/2023    Pt will verbalize understanding of ways to prevent future PD related complications and PD community resources.  Baseline: not yet initiated Goal status: INITIAL  2.  Pt will write a short paragraph with no significant decrease in size and maintain 100% legibility.  Baseline: 25% legibility Goal status: INITIAL  3.  Pt will verbalize understanding of ways to keep thinking skills sharp and ways to compensate for STM changes in the future.  Baseline: not yet initiated Goal status: INITIAL  4.  Pt will demonstrate improved ease with feeding as evidenced by decreasing PPT#2 by 3 secs.  Baseline: 27 secs Goal status: INITIAL  5.  Pt will demonstrate increased ease with dressing as evidenced by decreasing PPT#4 (don/ doff jacket) to 24 secs or less.  Baseline: 27 secs Goal status: INITIAL  6.  Pt will demonstrate improved fine  motor coordination for ADLs as evidenced by decreasing 9 hole peg test score for each hand by 5 secs  Baseline: Right: 55 sec; Left: 42 sec  Goal status: INITIAL   7.  Pt will demonstrate improved ease with fastening buttons as evidenced by decreasing 3 button/unbutton time by 10 seconds  Baseline: 1 minute 55 seconds seconds Goal status: INITIAL  8.  Pt will be able to place at least 29 blocks using R hand with completion of Box and Blocks test.  Baseline: 26 blocks Goal status: INITIAL    ASSESSMENT:  CLINICAL IMPRESSION: Pt is progressing towards goals. Pt/ wife verbalize understanding, however pt may benefit from reinforcement. Pt has difficulty identifying when reading glasses were needed. Significant improvement with writing accuracy using reading glasses.   PERFORMANCE DEFICITS: in functional skills including ADLs, IADLs, coordination, tone, Fine motor control, Gross motor control, mobility, balance, body mechanics, and UE functional use, cognitive skills including attention, perception, problem solving, sequencing, and understand  IMPAIRMENTS: are limiting patient from ADLs, IADLs, and leisure.   COMORBIDITIES:  has co-morbidities such as HTN, HLD, DM  that affects occupational performance. Patient will benefit from skilled OT to address above impairments and improve overall function.  OT OCCUPATIONAL PROFILE AND HISTORY: Detailed assessment: Review of records and additional review of physical, cognitive, psychosocial history related to current functional performance.  REHAB POTENTIAL: Fair given PD diagnosis   PLAN:  OT FREQUENCY: 2x/week  OT DURATION: 12 weeks  PLANNED INTERVENTIONS: self care/ADL training, therapeutic exercise, therapeutic activity, neuromuscular re-education, manual therapy, functional mobility training, ultrasound, paraffin, fluidotherapy, moist heat, patient/family education, cognitive remediation/compensation, visual/perceptual  remediation/compensation, energy conservation, coping strategies training, DME and/or AE instructions, and Re-evaluation  RECOMMENDED OTHER SERVICES: none at this time  CONSULTED AND AGREED WITH PLAN OF CARE: Patient and family member/caregiver  PLAN FOR NEXT SESSION: ADL strategies, fastening buttons (pt encouraged to wear button up shirt to next therapy visit), donning jacket, handwriting with foam grip-issue and review handout for writing with PD (have pt wear reading gls if he has them)  Dennis Bast, OT 01/07/2023, 3:15 PM

## 2023-01-07 NOTE — Therapy (Signed)
OUTPATIENT PHYSICAL THERAPY NEURO TREATMENT    Patient Name: Edward Briggs MRN: [id number] DOB:[birth year], 72 y.o., male Today's Date: 01/07/2023   PCP: [name], MD  REFERRING PROVIDER:  [name], MD    END OF SESSION:  PT End of Session - 01/07/23 1536     Visit Number 11    Number of Visits 17    Date for PT Re-Evaluation 02/02/23    Authorization Type [employer] Medicare    Progress Note Due on Visit 9   PN done on visit 9   PT Start Time 1535   handoff with OT   PT Stop Time 1615    PT Time Calculation (min) 40 min    Equipment Utilized During Treatment Gait belt    Activity Tolerance Patient tolerated treatment well    Behavior During Therapy WFL for tasks assessed/performed                   Past Medical History:  Diagnosis Date   Diabetes mellitus without complication ([place])    GERD (gastroesophageal reflux disease)    no meds   Hypertension    controlled on meds   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   Past Surgical History:  Procedure Laterality Date   ABSCESS DRAINAGE     COLONOSCOPY     ~ 10 yrs ago   [place]  03/26/2012   Procedure: HARDWARE REMOVAL;  Surgeon: [name], MD;  Location: [place];  Service: Orthopedics;  Laterality: Left;  hardware removal deep left ankle syndesmotic screws only and stress xrays ankle    ORIF ANKLE FRACTURE  12/03/2011   Procedure: OPEN REDUCTION INTERNAL FIXATION (ORIF) ANKLE FRACTURE;  Surgeon: [name], MD;  Location: [facility];  Service: Orthopedics;  Laterality: Left;  left medial malleolus fracture   PILONIDAL CYST EXCISION  ~1982   SKIN GRAFT  ~1970   Lt great toe (graft from Lt thigh)   Patient Active Problem List   Diagnosis Date Noted   HTN (hypertension) 123456   Acute metabolic encephalopathy 123456   AKI (acute kidney injury) ([place]) 07/22/2019   Sepsis ([place]) 07/22/2019   UTI (urinary tract infection) 07/22/2019    Diabetes mellitus without complication ([place]) 123XX123   CHEST PAIN 07/21/2009    ONSET DATE: 12/01/2022   REFERRING DIAG: G20.A1 (ICD-10-CM) - Parkinson's disease without dyskinesia, unspecified whether manifestations fluctuate   THERAPY DIAG:  Muscle weakness (generalized)  Abnormal posture  Unsteadiness on feet  Other symptoms and signs involving the nervous system  Rationale for Evaluation and Treatment: Rehabilitation  SUBJECTIVE:  SUBJECTIVE STATEMENT: Tried the rollator last visit, and noticed some incr in back pain when using it up and down the inclines. Reports back is feeling good today. No falls   Pt accompanied by:  Spouse, Edward Briggs    PERTINENT HISTORY: PMH: HTN, Diabetes, HLD   Pt saw neurologist [name] on 12/01/22 for memory impairment, per 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 Pt started on Sinemet   PAIN:  Are you having pain? No  PRECAUTIONS: Fall  WEIGHT BEARING RESTRICTIONS: No  FALLS: Has patient fallen in last 6 months? Yes. Number of falls 2  LIVING ENVIRONMENT: Lives with: lives with their spouse Lives in: House/apartment Stairs: Yes: Internal: 10 steps; on right going up and External: 4 steps; on right going up Has following equipment at home: None  PLOF: Independent  PATIENT GOALS: Wants to get back to normal.   OBJECTIVE:   DIAGNOSTIC FINDINGS: last brain imaging was a CT of the head done in September 2020 and showed some cerebral atrophy some small vessel white matter disease but no acute intracranial pathology.   COGNITION: Overall cognitive status:  Reports some memory changes.    SENSATION: WFL  COORDINATION: Bradykinesia  RLE>LLE    MUSCLE TONE: RLE: Rigidity   POSTURE: rounded shoulders, forward head, posterior pelvic tilt, and flexed trunk    LOWER EXTREMITY MMT:    MMT Right Eval Left Eval  Hip flexion 4+ 4  Hip extension    Hip abduction 5 5  Hip adduction 4+ 4+  Hip internal rotation    Hip external rotation    Knee flexion 5 5  Knee extension 4+ 4+  Ankle dorsiflexion 4+ 4+  Ankle plantarflexion    Ankle inversion    Ankle eversion    (Blank rows = not tested)  All tested in sitting.  TODAY'S TREATMENT:     Ther Ex  SciFit multi-peaks level 4.0 for 8 minutes using BUE/BLEs for neural priming for reciprocal movement, dynamic cardiovascular warmup and increased amplitude of stepping. RPE of 6-7/10    Gait Training  GAIT Gait pattern: step through pattern, decreased arm swing- Right, decreased arm swing- Left, decreased stride length, decreased hip/knee flexion- Right, decreased hip/knee flexion- Left, decreased ankle dorsiflexion- Right, decreased ankle dorsiflexion- Left, shuffling, festinating, decreased trunk rotation, and poor foot clearance- Right Distance walked: 230' x 1 with RW,  230' 1 with rollator  Assistive device utilized: Environmental consultant - 4 wheeled Level of assistance: SBA  Comments: Trialed RW today for improved posture, step length and to decr risk of falls. Cued to stay inside RW and for incr step length. Despite cues, pt still pushing RW slightly forwards. Pt with difficulty maneuvering around curves and felt like he got stuck. Pt did still demo forward flexed posture with RW, but pt able to ambulate with supervision. Pt reporting feeling more stable with the rollator (trialed at previous session). Practiced again today with continued cues to staying close to rollator, step length, and upright posture. Pt with tendency to push rollator too far anteriorly and then have more forward flexed posture and ambulating too quickly, needing CGA at times. Discussed safety concerns with  rollator with going too quickly and incr forward flexed posture. Pt still reporting that rollator felt more steady. Will continue to determine proper AD going forwards.   With no AD, cued for posture, step length and arm swing. Educated on importance  of arm swing with gait for trunk rotation and balance.   NMR  Against wall for posture/visual and manual feedback Standing PWR Up x10 reps, cued to bring belly button forwards for hip extension, elbow extension, and standing tall against wall. Pt fatigued easily with this and needing intermittent rest breaks  Wall bumps for hip strategy x10 reps, cues for technique and when coming forwards - hip and knee extension   PATIENT EDUCATION: Education details: Continue HEP, importance of posture and arm swing with gait, will have to further determine which AD would be most beneficial for pt  Person educated: Patient and Spouse Education method: Explanation, Demonstration, Tactile cues, and Verbal cues Education comprehension: verbalized understanding, tactile cues required, and needs further education  HOME EXERCISE PROGRAM: Access Code: [email or web address] URL: [email or web address] Date: 12/08/2022 Prepared by: Mickie Bail Plaster  Exercises - Sit to Stand Without Arm Support  - 1 x daily - 7 x weekly - 3 sets - 10 reps - Standing Reach to Opposite Side with Weight Shift  - 1 x daily - 7 x weekly - 3 sets - 10 reps - Seated Windmill Trunk Rotation Stretch  - 1 x daily - 7 x weekly - 3 sets - 10 reps - Step sideways with arms reaching at counter   - 1 x daily - 7 x weekly - 3 sets - 10 reps - Sidelying Thoracic Rotation with Open Book  - 1 x daily - 7 x weekly - 3 sets - 10 reps - Cervical Retraction at Wall  - 1 x daily - 7 x weekly - 3-5 reps - 30 second hold - Sit to stand with band pull-apart   - 1 x daily - 7 x weekly - 3 sets - 10 reps - Lower Trunk Rotation Stretch  - 1 x daily - 7 x weekly - 4-5 reps - 20-30 second hold  GOALS: Goals  reviewed with patient? Yes  SHORT TERM GOALS: Target date: 01/01/2023  Pt will be independent with PD specific initial HEP in order to build upon functional gains made in therapy. Baseline: pt reports performing consistently at home.  Goal status: MET  2.  Pt will improve MiniBest to 15/28 for decreased fall risk and improvement with compensatory stepping strategies.   Baseline: 12/28 on 2/5; 15/28 on 12/31/22 Goal status: MET  3.  Pt will verbalize understanding of fall prevention in the home.  Baseline:  Goal status: INITIAL  4.  Pt will verbalize/demonstrate understanding of freezing techniques Baseline: reviewed and provided handout on 12/29/22 Goal status: MET  5.  Pt will improve manual TUG time to 21 seconds or less in order to demo decrease fall risk. Baseline: 27 seconds, 13.4 seconds on 12/29/22 Goal status: MET  6.  Pt will improve 5x sit<>stand to less than or equal to 17.5 sec to demonstrate improved functional strength and transfer efficiency.  Baseline: 20.28 seconds without UE support,  11.6 seconds without UE support on 12/29/22 Goal status: MET   LONG TERM GOALS: Target date: 01/29/2023  Pt will be independent with PD specific final HEP in order to build upon functional gains made in therapy. Baseline:  Goal status: INITIAL  2.  Pt will improve MiniBest to 18/28 for decreased fall risk and improvement with compensatory stepping strategies.   Baseline: 12/28  Goal status: REVISED  3.  Pt will improve 5x sit<>stand to less than or equal to 15 sec to demonstrate improved functional strength and transfer efficiency.  Baseline: 20.28 seconds without  UE support; 11.6 seconds without UE support on 12/29/22 Goal status: MET  4.  Pt will improve gait speed with no AD to at least 3.3 ft/sec in order to demo improved community mobility.  Baseline: 11.07 seconds = 2.96 ft/sec Goal status: INITIAL  5.   Pt will improve manual TUG time to 17 seconds or less with  supervision in order to demo decrease fall risk. Baseline: 27 seconds  Goal status: INITIAL  6.  Pt will verbalize understanding of community resources for PD.  Baseline:  Goal status: INITIAL  ASSESSMENT:  CLINICAL IMPRESSION: Pt's back pain better during session today. Continued to trial different AD to work on posture, step length and decr fall risk. Trialed RW today with pt demonstrating somewhat improved posture and step length, but pt reporting not feeling as steady with this and had difficulty navigating it when going around turns. Tried the rollator again, but PT concerned about pt's safety with this due to incr forward flexed posture, incr gait speed with shortened steps and pushing the rollator too far anteriorly. Pt able to correct for small bouts with cues, but otherwise reverts back to pushing rollator too far anteriorly. Remainder of session focusing on standing PWR against wall and hip strategy for improved posture. Will continue per POC.   OBJECTIVE IMPAIRMENTS: Abnormal gait, decreased activity tolerance, decreased balance, decreased coordination, decreased mobility, difficulty walking, decreased strength, impaired flexibility, impaired tone, and postural dysfunction.   ACTIVITY LIMITATIONS: bending, stairs, transfers, dressing, and locomotion level  PARTICIPATION LIMITATIONS: driving and community activity  PERSONAL FACTORS: Behavior pattern, Past/current experiences, Time since onset of injury/illness/exacerbation, and 3+ comorbidities: HTN, Diabetes, HLD, newly diagnosed PD  are also affecting patient's functional outcome.   REHAB POTENTIAL: Good  CLINICAL DECISION MAKING: Evolving/moderate complexity  EVALUATION COMPLEXITY: Moderate  PLAN:  PT FREQUENCY: 2x/week  PT DURATION: 8 weeks  PLANNED INTERVENTIONS: Therapeutic exercises, Therapeutic activity, Neuromuscular re-education, Balance training, Gait training, Patient/Family education, Self Care, Stair training,  Vestibular training, DME instructions, and Re-evaluation  PLAN FOR NEXT SESSION: balance deficits found on miniBEST - esp stepping strategies! Rollator worked okay- was not significant enough to recommend it.  Gait training with larger amplitude movements and arm swing. WORK ON TURNS!!!!, UNLEVEL SURFACES, resisted gait, slam ball activities, anterior weight shifting    Arliss Journey, PT, DPT  01/07/2023, 4:34 PM

## 2023-01-09 ENCOUNTER — Ambulatory Visit: Payer: Medicare Other | Admitting: Occupational Therapy

## 2023-01-09 ENCOUNTER — Encounter: Payer: Self-pay | Admitting: Occupational Therapy

## 2023-01-09 DIAGNOSIS — Z9181 History of falling: Secondary | ICD-10-CM

## 2023-01-09 DIAGNOSIS — R2681 Unsteadiness on feet: Secondary | ICD-10-CM | POA: Diagnosis not present

## 2023-01-09 DIAGNOSIS — R41840 Attention and concentration deficit: Secondary | ICD-10-CM

## 2023-01-09 DIAGNOSIS — R293 Abnormal posture: Secondary | ICD-10-CM

## 2023-01-09 DIAGNOSIS — M6281 Muscle weakness (generalized): Secondary | ICD-10-CM

## 2023-01-09 DIAGNOSIS — R278 Other lack of coordination: Secondary | ICD-10-CM

## 2023-01-09 DIAGNOSIS — R29818 Other symptoms and signs involving the nervous system: Secondary | ICD-10-CM

## 2023-01-09 DIAGNOSIS — R41842 Visuospatial deficit: Secondary | ICD-10-CM

## 2023-01-09 NOTE — Therapy (Signed)
OUTPATIENT OCCUPATIONAL THERAPY PARKINSON'S TREATMENT  Patient Name: Edward Briggs MRN: [id number] DOB:1951/02/10, 72 y.o., male Today's Date: 01/09/2023  PCP: [name], MD  REFERRING PROVIDER: [name], MD   END OF SESSION:  OT End of Session - 01/09/23 1235     Visit Number 5    Number of Visits 25    Date for OT Re-Evaluation 03/06/23    Authorization Type [employer] Medicare    Progress Note Due on Visit 10    OT Start Time 1234    Activity Tolerance Patient tolerated treatment well    Behavior During Therapy [facility] for tasks assessed/performed            Past Medical History:  Diagnosis Date   Diabetes mellitus without complication ([place])    GERD (gastroesophageal reflux disease)    no meds   Hypertension    controlled on meds   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   Past Surgical History:  Procedure Laterality Date   ABSCESS DRAINAGE     COLONOSCOPY     ~ 10 yrs ago   [place]  03/26/2012   Procedure: HARDWARE REMOVAL;  Surgeon: [name], MD;  Location: [facility];  Service: Orthopedics;  Laterality: Left;  hardware removal deep left ankle syndesmotic screws only and stress xrays ankle    ORIF ANKLE FRACTURE  12/03/2011   Procedure: OPEN REDUCTION INTERNAL FIXATION (ORIF) ANKLE FRACTURE;  Surgeon: [name], MD;  Location: [facility];  Service: Orthopedics;  Laterality: Left;  left medial malleolus fracture   PILONIDAL CYST EXCISION  ~1982   SKIN GRAFT  ~1970   Lt great toe (graft from Lt thigh)   Patient Active Problem List   Diagnosis Date Noted   HTN (hypertension) 123456   Acute metabolic encephalopathy 123456   AKI (acute kidney injury) ([place]) 07/22/2019   Sepsis ([place]) 07/22/2019   UTI (urinary tract infection) 07/22/2019   Diabetes mellitus without complication ([place]) 123XX123   CHEST PAIN 07/21/2009    ONSET DATE: 12/01/2022  REFERRING DIAG: G20.A1 (ICD-10-CM) -  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 He did forget to wear a button up shirt today.   Pt accompanied by: significant other - [name]  PERTINENT HISTORY:   Per PT eval, " PMH: HTN, Diabetes, HLD    Pt saw neurologist [name] on 12/01/22 for memory impairment, per 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 Pt started on Sinemet.  Noticed a difference in his walking about 4-6 weeks ago. Was started on Sinemet and started taking it (1/31). Has been taking small, shuffled steps. Notices that he will lean forward at times. Getting up from a chair is getting more difficult. Has had 2 falls - one he was getting up and his lost balance, 2nd fall was down the stairs and missed a step."    PRECAUTIONS: Fall  WEIGHT BEARING RESTRICTIONS: No  PAIN:  Are you having pain? No  FALLS: Has patient fallen in last  6 months? Yes. Number of falls 2  LIVING ENVIRONMENT: Lives with: lives with their spouse Lives in: House/apartment Stairs: Yes: Internal: 10 steps; on right going up and External: 4 steps; on right going up Has following equipment at home: None   PLOF: Independent   PATIENT GOALS: Wants to get back to normal.   OBJECTIVE:   HAND DOMINANCE: Right  ADLs: Transfers/ambulation related to ADLs: Eating: mod I Grooming: I UB Dressing: min A LB Dressing: mod A Toileting: min A with buttoning pants Bathing: Distance supervision  Tub Shower transfers:  distance supervision Equipment: Careers information officer [place]  IADLs: Shopping: same as PLOF Light housekeeping: mod I Meal Prep: light meal prep, has only been cooking breakfast Community mobility: driving, short distance, no highway Medication management: min A Financial management: I Handwriting: 25% legible  MOBILITY STATUS: Needs Assist: SBA  ACTIVITY TOLERANCE: Activity tolerance: good  FUNCTIONAL OUTCOME MEASURES: Fastening/unfastening 3 buttons: 1:55  Physical performance test: PPT#2 (simulated eating) 27 seconds R = 1 & PPT#4 (donning/doffing jacket): 27 seconds = 1  COORDINATION: 9 Hole Peg test: Right: 55 sec; Left: 42 sec Box and Blocks:  Right 26 blocks, Left 31blocks  Grip strength: 73.4 lbs R ; 74.5 lbs L  UE ROM:  WFL  UE MMT:   WFL  SENSATION: WFL  MUSCLE TONE: RUE: Moderate more prominent in shoulder and LUE: Mild more prominent in elbow  COGNITION: Overall cognitive status: Impaired - impaired motor planning, processing,  OBSERVATIONS: Bradykinesia and Pill rolling L hand;  Difficulty following commands  TODAY'S TREATMENT:                                                                                                       - Therapeutic exercises completed for duration as noted below including: OT reviewed UE coordination HEP as noted in pt instructions. Pt required mod cueing for proper completion. Pt completed all exercises with RUE and card exercises with LUE. Tape with smiley face placed in pt's palm to emphasize UE supination.   PATIENT EDUCATION: Education details: BIG movements; coordination tasks Person educated: Patient and Spouse Education method: Customer service manager, verbal cues, demonstration Education comprehension: verbalized understanding, returned demonstration, and needs further education  HOME EXERCISE PROGRAM: 12/22/22 coordination HEP  GOALS:  SHORT TERM GOALS: Target date: 01/05/2023    Pt will be independent with PD  specific HEP.  Baseline: not yet initiated Goal status: IN PROGRESS  2.  Pt will verbalize understanding of adapted strategies to maximize safety and independence with ADLs/IADLs.  Baseline: not yet initiated Goal status: INITIAL  3.  Pt will write a sentence with no significant decrease in size and maintain 75% legibility.  Baseline: 25% legibility Goal status: INITIAL  4.  Pt will demonstrate improved ease with fastening buttons as evidenced by decreasing 3 button/unbutton time by 5 seconds  Baseline: 1 minute 55 seconds Goal status: INITIAL    LONG TERM GOALS: Target date: 03/06/2023    Pt will verbalize understanding of ways to prevent future PD related complications and PD community resources.  Baseline: not  yet initiated Goal status: INITIAL  2.  Pt will write a short paragraph with no significant decrease in size and maintain 100% legibility.  Baseline: 25% legibility Goal status: INITIAL  3.  Pt will verbalize understanding of ways to keep thinking skills sharp and ways to compensate for STM changes in the future.  Baseline: not yet initiated Goal status: INITIAL  4.  Pt will demonstrate improved ease with feeding as evidenced by decreasing PPT#2 by 3 secs.  Baseline: 27 secs Goal status: INITIAL  5.  Pt will demonstrate increased ease with dressing as evidenced by decreasing PPT#4 (don/ doff jacket) to 24 secs or less.  Baseline: 27 secs Goal status: INITIAL  6.  Pt will demonstrate improved fine motor coordination for ADLs as evidenced by decreasing 9 hole peg test score for each hand by 5 secs  Baseline: Right: 55 sec; Left: 42 sec  Goal status: INITIAL   7.  Pt will demonstrate improved ease with fastening buttons as evidenced by decreasing 3 button/unbutton time by 10 seconds  Baseline: 1 minute 55 seconds seconds Goal status: INITIAL  8.  Pt will be able to place at least 29 blocks using R hand with completion of Box and Blocks  test.  Baseline: 26 blocks Goal status: INITIAL    ASSESSMENT:  CLINICAL IMPRESSION: Pt is progressing towards goals. Pt/ wife verbalize understanding, however pt may benefit from reinforcement. Remains good candidate for skilled OT services to improve independence and safety with ADLs and IADLs.  PERFORMANCE DEFICITS: in functional skills including ADLs, IADLs, coordination, tone, Fine motor control, Gross motor control, mobility, balance, body mechanics, and UE functional use, cognitive skills including attention, perception, problem solving, sequencing, and understand  IMPAIRMENTS: are limiting patient from ADLs, IADLs, and leisure.   COMORBIDITIES:  has co-morbidities such as HTN, HLD, DM  that affects occupational performance. Patient will benefit from skilled OT to address above impairments and improve overall function.  OT OCCUPATIONAL PROFILE AND HISTORY: Detailed assessment: Review of records and additional review of physical, cognitive, psychosocial history related to current functional performance.  REHAB POTENTIAL: Fair given PD diagnosis   PLAN:  OT FREQUENCY: 2x/week  OT DURATION: 12 weeks  PLANNED INTERVENTIONS: self care/ADL training, therapeutic exercise, therapeutic activity, neuromuscular re-education, manual therapy, functional mobility training, ultrasound, paraffin, fluidotherapy, moist heat, patient/family education, cognitive remediation/compensation, visual/perceptual remediation/compensation, energy conservation, coping strategies training, DME and/or AE instructions, and Re-evaluation  RECOMMENDED OTHER SERVICES: none at this time  CONSULTED AND AGREED WITH PLAN OF CARE: Patient and family member/caregiver  PLAN FOR NEXT SESSION: ADL strategies, fastening buttons (pt encouraged to wear button up shirt to next therapy visit), donning jacket, handwriting with foam grip-issue and review handout for writing with PD (have pt wear reading gls if he has  them)  Dennis Bast, OT 01/09/2023, 12:36 PM

## 2023-01-12 ENCOUNTER — Ambulatory Visit: Payer: Medicare Other | Admitting: Physical Therapy

## 2023-01-12 ENCOUNTER — Encounter: Payer: Self-pay | Admitting: Physical Therapy

## 2023-01-12 ENCOUNTER — Encounter: Payer: Self-pay | Admitting: Occupational Therapy

## 2023-01-12 ENCOUNTER — Ambulatory Visit: Payer: Medicare Other | Admitting: Occupational Therapy

## 2023-01-12 DIAGNOSIS — R278 Other lack of coordination: Secondary | ICD-10-CM

## 2023-01-12 DIAGNOSIS — M6281 Muscle weakness (generalized): Secondary | ICD-10-CM

## 2023-01-12 DIAGNOSIS — R41840 Attention and concentration deficit: Secondary | ICD-10-CM

## 2023-01-12 DIAGNOSIS — R41842 Visuospatial deficit: Secondary | ICD-10-CM

## 2023-01-12 DIAGNOSIS — R2681 Unsteadiness on feet: Secondary | ICD-10-CM

## 2023-01-12 DIAGNOSIS — R29818 Other symptoms and signs involving the nervous system: Secondary | ICD-10-CM

## 2023-01-12 DIAGNOSIS — R293 Abnormal posture: Secondary | ICD-10-CM

## 2023-01-12 NOTE — Therapy (Unsigned)
OUTPATIENT OCCUPATIONAL THERAPY [facility]  Patient Name: Edward Briggs MRN: [id number] DOB:12/30/1950, 72 y.o., male Today's Date: 01/12/2023  PCP: [name], MD  REFERRING PROVIDER: [name], MD   END OF SESSION:  OT End of Session - 01/12/23 1534     Visit Number 6    Number of Visits 25    Date for OT Re-Evaluation 03/06/23    Authorization Type [employer] Medicare    Progress Note Due on Visit 10    OT Start Time 1535    OT Stop Time 1614    OT Time Calculation (min) 39 min    Activity Tolerance Patient tolerated treatment well    Behavior During Therapy WFL for tasks assessed/performed             Past Medical History:  Diagnosis Date   Diabetes mellitus without complication ([place])    GERD (gastroesophageal reflux disease)    no meds   Hypertension    controlled on meds   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   Past Surgical History:  Procedure Laterality Date   ABSCESS DRAINAGE     COLONOSCOPY     ~ 10 yrs ago   [place]  03/26/2012   Procedure: HARDWARE REMOVAL;  Surgeon: [name], MD;  Location: [place];  Service: Orthopedics;  Laterality: Left;  hardware removal deep left ankle syndesmotic screws only and stress xrays ankle    ORIF ANKLE FRACTURE  12/03/2011   Procedure: OPEN REDUCTION INTERNAL FIXATION (ORIF) ANKLE FRACTURE;  Surgeon: [name], MD;  Location: [place];  Service: Orthopedics;  Laterality: Left;  left medial malleolus fracture   PILONIDAL CYST EXCISION  ~1982   SKIN GRAFT  ~1970   Lt great toe (graft from Lt thigh)   Patient Active Problem List   Diagnosis Date Noted   HTN (hypertension) 123456   Acute metabolic encephalopathy 123456   AKI (acute kidney injury) ([place]) 07/22/2019   Sepsis ([place]) 07/22/2019   UTI (urinary tract infection) 07/22/2019   Diabetes mellitus without complication ([place]) 123XX123   CHEST PAIN 07/21/2009    ONSET  DATE: 12/01/2022  REFERRING DIAG: G20.A1 (ICD-10-CM) - Parkinson's disease without dyskinesia, unspecified whether manifestations fluctuate   THERAPY DIAG:  Muscle weakness (generalized)  Other symptoms and signs involving the nervous system  Other lack of coordination  Visuospatial deficit  Attention and concentration deficit  Rationale for Evaluation and Treatment: Rehabilitation  SUBJECTIVE:   SUBJECTIVE STATEMENT:  He has not been consistently completing HEP. He did forget to wear a button up shirt today.   Pt accompanied by: significant other - [name]  PERTINENT HISTORY:   Per PT eval, " PMH: HTN, Diabetes, HLD    Pt saw neurologist [name] on 12/01/22 for memory impairment, per 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 Pt started on Sinemet.  Noticed a difference in his walking about 4-6 weeks ago. Was started on Sinemet and started taking it (1/31). Has been taking small, shuffled steps. Notices that he will lean forward at times. Getting up from a chair is getting more difficult. Has had 2 falls - one he was getting up and his lost balance, 2nd fall was down the stairs and missed a step."    PRECAUTIONS: Fall  WEIGHT BEARING RESTRICTIONS: No  PAIN:  Are you  having pain? No  FALLS: Has patient fallen in last 6 months? Yes. Number of falls 2  LIVING ENVIRONMENT: Lives with: lives with their spouse Lives in: House/apartment Stairs: Yes: Internal: 10 steps; on right going up and External: 4 steps; on right going up Has following equipment at home: None   PLOF: Independent   PATIENT GOALS: Wants to get back to normal.   OBJECTIVE:   HAND DOMINANCE: Right  ADLs: Transfers/ambulation related to ADLs: Eating: mod I Grooming: I UB Dressing: min A LB Dressing: mod A Toileting: min A with buttoning pants Bathing: Distance supervision  Tub  Shower transfers: distance supervision Equipment: Careers information officer [place]  IADLs: Shopping: same as PLOF Light housekeeping: mod I Meal Prep: light meal prep, has only been cooking breakfast Community mobility: driving, short distance, no highway Medication management: min A Financial management: I Handwriting: 25% legible  MOBILITY STATUS: Needs Assist: SBA  ACTIVITY TOLERANCE: Activity tolerance: good  FUNCTIONAL OUTCOME MEASURES: Fastening/unfastening 3 buttons: 1:55  Physical performance test: PPT#2 (simulated eating) 27 seconds R = 1 & PPT#4 (donning/doffing jacket): 27 seconds = 1  COORDINATION: 9 Hole Peg test: Right: 55 sec; Left: 42 sec Box and Blocks:  Right 26 blocks, Left 31blocks  Grip strength: 73.4 lbs R ; 74.5 lbs L  UE ROM:  WFL  UE MMT:   WFL  SENSATION: WFL  MUSCLE TONE: RUE: Moderate more prominent in shoulder and LUE: Mild more prominent in elbow  COGNITION: Overall cognitive status: Impaired - impaired motor planning, processing,  OBSERVATIONS: Bradykinesia and Pill rolling L hand;  Difficulty following commands  TODAY'S TREATMENT:         - Self-care/home management completed for duration as noted below including:  Pt demonstrated buttoning of button up shirt in standing with improved speed and accuracy. Pt cued for proper posture and to widen base of support.                                                                                                 - Therapeutic exercises completed for duration as noted below including: Placement and removal of yellow, red, blue, black, and green resistive clips with use of 3 point pinch for strengthening of affected extremity. Pt able to place clips vertically for additional challenge to shoulder ROM, vertical gaze, and posture requiring verbal and tactile cueing for completion.   - Therapeutic activities completed for duration as noted below including: Handwriting on notebook paper with Pen Again with cues  for large movements.   PATIENT EDUCATION: Education details: BIG movements; coordination tasks Person educated: Patient and Spouse Education method: Customer service manager, verbal cues, demonstration Education comprehension: verbalized understanding, returned demonstration, and needs further education  HOME EXERCISE PROGRAM: 12/22/22 coordination HEP  GOALS:  SHORT TERM GOALS: Target date: 01/05/2023    Pt will be independent with PD specific HEP.  Baseline: not yet initiated Goal status: IN PROGRESS  2.  Pt will verbalize understanding of adapted strategies to maximize safety and independence with ADLs/IADLs.  Baseline: not yet initiated Goal status: INITIAL  3.  Pt will write a sentence  with no significant decrease in size and maintain 75% legibility.  Baseline: 25% legibility Goal status: INITIAL  4.  Pt will demonstrate improved ease with fastening buttons as evidenced by decreasing 3 button/unbutton time by 5 seconds  Baseline: 1 minute 55 seconds Goal status: INITIAL    LONG TERM GOALS: Target date: 03/06/2023    Pt will verbalize understanding of ways to prevent future PD related complications and PD community resources.  Baseline: not yet initiated Goal status: INITIAL  2.  Pt will write a short paragraph with no significant decrease in size and maintain 100% legibility.  Baseline: 25% legibility Goal status: INITIAL  3.  Pt will verbalize understanding of ways to keep thinking skills sharp and ways to compensate for STM changes in the future.  Baseline: not yet initiated Goal status: INITIAL  4.  Pt will demonstrate improved ease with feeding as evidenced by decreasing PPT#2 by 3 secs.  Baseline: 27 secs Goal status: INITIAL  5.  Pt will demonstrate increased ease with dressing as evidenced by decreasing PPT#4 (don/ doff jacket) to 24 secs or less.  Baseline: 27 secs Goal status: INITIAL  6.  Pt will demonstrate improved fine motor  coordination for ADLs as evidenced by decreasing 9 hole peg test score for each hand by 5 secs  Baseline: Right: 55 sec; Left: 42 sec  Goal status: INITIAL   7.  Pt will demonstrate improved ease with fastening buttons as evidenced by decreasing 3 button/unbutton time by 10 seconds  Baseline: 1 minute 55 seconds seconds Goal status: INITIAL  8.  Pt will be able to place at least 29 blocks using R hand with completion of Box and Blocks test.  Baseline: 26 blocks Goal status: INITIAL    ASSESSMENT:  CLINICAL IMPRESSION: Pt is progressing towards goals. Pt/ wife verbalize understanding, however pt may benefit from reinforcement. Remains good candidate for skilled OT services to improve independence and safety with ADLs and IADLs.  PERFORMANCE DEFICITS: in functional skills including ADLs, IADLs, coordination, tone, Fine motor control, Gross motor control, mobility, balance, body mechanics, and UE functional use, cognitive skills including attention, perception, problem solving, sequencing, and understand  IMPAIRMENTS: are limiting patient from ADLs, IADLs, and leisure.   COMORBIDITIES:  has co-morbidities such as HTN, HLD, DM  that affects occupational performance. Patient will benefit from skilled OT to address above impairments and improve overall function.  OT OCCUPATIONAL PROFILE AND HISTORY: Detailed assessment: Review of records and additional review of physical, cognitive, psychosocial history related to current functional performance.  REHAB POTENTIAL: Fair given PD diagnosis   PLAN:  OT FREQUENCY: 2x/week  OT DURATION: 12 weeks  PLANNED INTERVENTIONS: self care/ADL training, therapeutic exercise, therapeutic activity, neuromuscular re-education, manual therapy, functional mobility training, ultrasound, paraffin, fluidotherapy, moist heat, patient/family education, cognitive remediation/compensation, visual/perceptual remediation/compensation, energy conservation, coping  strategies training, DME and/or AE instructions, and Re-evaluation  RECOMMENDED OTHER SERVICES: none at this time  CONSULTED AND AGREED WITH PLAN OF CARE: Patient and family member/caregiver  PLAN FOR NEXT SESSION: ADL strategies, fastening buttons (pt encouraged to wear button up shirt to next therapy visit), donning jacket, handwriting with foam grip-issue and review handout for writing with PD (have pt wear reading gls if he has them)  Dennis Bast, OT 01/12/2023, 5:45 PM

## 2023-01-12 NOTE — Therapy (Signed)
OUTPATIENT PHYSICAL THERAPY [facility]URO TREATMENT    Patient Name: Edward Briggs MRN: [id number] DOB:January 14, 1951, 72 y.o., male Today's Date: 01/12/2023   PCP: [name], MD  REFERRING PROVIDER:  [name], MD    END OF SESSION:  PT End of Session - 01/12/23 1403     Visit Number 12    Number of Visits 17    Date for PT Re-Evaluation 02/02/23    Authorization Type [employer] Medicare    Progress Note Due on Visit 9   PN done on visit 9   PT Start Time 1401    PT Stop Time 1444    PT Time Calculation (min) 43 min    Equipment Utilized During Treatment Gait belt    Activity Tolerance Patient tolerated treatment well    Behavior During Therapy WFL for tasks assessed/performed                   Past Medical History:  Diagnosis Date   Diabetes mellitus without complication ([place])    GERD (gastroesophageal reflux disease)    no meds   Hypertension    controlled on meds   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   Past Surgical History:  Procedure Laterality Date   ABSCESS DRAINAGE     COLONOSCOPY     ~ 10 yrs ago   [place]  03/26/2012   Procedure: HARDWARE REMOVAL;  Surgeon: [name], MD;  Location: [place];  Service: Orthopedics;  Laterality: Left;  hardware removal deep left ankle syndesmotic screws only and stress xrays ankle    ORIF ANKLE FRACTURE  12/03/2011   Procedure: OPEN REDUCTION INTERNAL FIXATION (ORIF) ANKLE FRACTURE;  Surgeon: [name], MD;  Location: [place];  Service: Orthopedics;  Laterality: Left;  left medial malleolus fracture   PILONIDAL CYST EXCISION  ~1982   SKIN GRAFT  ~1970   Lt great toe (graft from Lt thigh)   Patient Active Problem List   Diagnosis Date Noted   HTN (hypertension) 123456   Acute metabolic encephalopathy 123456   AKI (acute kidney injury) ([place]) 07/22/2019   Sepsis ([place]) 07/22/2019   UTI (urinary tract infection) 07/22/2019   Diabetes  mellitus without complication ([place]) 123XX123   CHEST PAIN 07/21/2009    ONSET DATE: 12/01/2022   REFERRING DIAG: G20.A1 (ICD-10-CM) - Parkinson's disease 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 Did not hurt himself.   Pt accompanied by:  Spouse, [name]    PERTINENT HISTORY: PMH: HTN, Diabetes, HLD   Pt saw neurologist [name] on 12/01/22 for memory impairment, per 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 Pt started on Sinemet   PAIN:  Are you having pain? Yes: NPRS scale: 4-5/10 Pain location: Low back pain    PRECAUTIONS: Fall  WEIGHT BEARING RESTRICTIONS: No  FALLS: Has patient fallen in last 6 months? Yes. Number of falls 2  LIVING ENVIRONMENT: Lives with: lives with their spouse Lives in: House/apartment Stairs: Yes: Internal: 10 steps; on right going up and External: 4 steps; on right going up Has following equipment at home: None  PLOF: Independent  PATIENT GOALS: Wants to get back to normal.   OBJECTIVE:   DIAGNOSTIC FINDINGS: last brain imaging was a CT of the head done in September 2020 and showed some cerebral atrophy some small vessel white matter disease but no acute intracranial pathology.   COGNITION: Overall cognitive status:   Reports some memory changes.    SENSATION: WFL  COORDINATION: Bradykinesia RLE>LLE    MUSCLE TONE: RLE: Rigidity   POSTURE: rounded shoulders, forward head, posterior pelvic tilt, and flexed trunk    LOWER EXTREMITY MMT:    MMT Right Eval Left Eval  Hip flexion 4+ 4  Hip extension    Hip abduction 5 5  Hip adduction 4+ 4+  Hip internal rotation    Hip external rotation    Knee flexion 5 5  Knee extension 4+ 4+  Ankle dorsiflexion 4+ 4+  Ankle plantarflexion    Ankle inversion    Ankle eversion    (Blank rows = not tested)  All tested in sitting.   TODAY'S TREATMENT:    GAIT Gait pattern: step through pattern, decreased arm swing- Right, decreased arm swing- Left, decreased stride length, decreased hip/knee flexion- Right, decreased hip/knee flexion- Left, decreased ankle dorsiflexion- Right, decreased ankle dorsiflexion- Left, shuffling, festinating, decreased trunk rotation, and poor foot clearance- Right Distance walked: 115' plus additional clinic distances  Assistive device utilized: Walker - 4 wheeled Level of assistance: SBA  Comments:With no AD, cued for posture, step length and arm swing. Educated on importance of arm swing with gait for trunk rotation and balance. Also educated to try to slow down gait speed, but pt at times walks too fast and has tendency to have too much of a forward lean.   NMR  Pt performs PWR! Moves in standing position    PWR! Up for improved posture x10 reps with cues to bring belly button forwards and for hip extension, performed an additional 10 reps of sit > stand > PWR Up without UE support with focus on incr amplitude of movement   PWR! Rock for improved weight shifting - at countertop reaching towards sticky notes x10 reps each side, then progressing to adding in contralateral leg lift for dynamic SLS x10 reps each side, pt more challenged with SLS on RLE   PWR! Twist for improved trunk rotation - in corner reaching laterally  towards sticky notes x10 reps each side, then reaching superior/laterally x10 reps each side. Pt more challenged with balance when  twisting to the L   Cues provided for large amplitude movements and opening up hands.    In // bars: Alternating forward stepping strategy x10 reps each side to floor target, x10 reps alternating posterior stepping and weight shifting, then performed forward step right into posterior step x10 reps each side. Pt more challenged with RLE as stance leg. Pt needing multi-modal cues throughout for proper technique as pt with difficulty coordinating movement and understanding task. Began with UE support > none  On air ex: alternating SLS taps to 6" step x12 reps each leg with no UE support, pt initially needs more cues for incr foot clearance with RLE   PATIENT EDUCATION: Education details: Continue HEP, importance of posture and arm swing with gait, fall prevention handout and went over with pt and pt' spouse  Person educated: Patient and Spouse Education method: Explanation, Demonstration, Tactile cues, and Verbal cues Education comprehension: verbalized understanding, tactile cues required, and needs further education  HOME EXERCISE PROGRAM: Access Code: [email or web address] URL: [email or web address] Date: 12/08/2022 Prepared by: Mickie Bail Plaster  Exercises - Sit to Stand Without Arm Support  - 1 x daily - 7 x weekly - 3 sets - 10 reps - Standing Reach to Opposite Side with Weight Shift  - 1 x daily - 7 x weekly - 3 sets - 10 reps - Seated Windmill Trunk Rotation Stretch  - 1 x daily - 7 x weekly - 3 sets - 10 reps - Step sideways with arms reaching at counter   - 1 x daily - 7 x weekly - 3 sets - 10 reps - Sidelying Thoracic Rotation with Open Book  - 1 x daily - 7 x weekly - 3 sets - 10 reps - Cervical Retraction at Wall  - 1 x daily - 7 x weekly - 3-5 reps - 30 second hold - Sit to stand with band pull-apart   - 1 x daily - 7 x weekly - 3 sets - 10 reps -  Lower Trunk Rotation Stretch  - 1 x daily - 7 x weekly - 4-5 reps - 20-30 second hold  GOALS: Goals reviewed with patient? Yes  SHORT TERM GOALS: Target date: 01/01/2023  Pt will be independent with PD specific initial HEP in order to build upon functional gains made in therapy. Baseline: pt reports performing consistently at home.  Goal status: MET  2.  Pt will improve MiniBest to 15/28 for decreased fall risk and improvement with compensatory stepping strategies.   Baseline: 12/28 on 2/5; 15/28 on 12/31/22 Goal status: MET  3.  Pt will verbalize understanding of fall prevention in the home.  Baseline: provided handout on 01/12/23 Goal status: MET  4.  Pt will verbalize/demonstrate understanding of freezing techniques Baseline: reviewed and provided handout on 12/29/22 Goal status: MET  5.  Pt will improve manual TUG time to 21 seconds or less in order to demo decrease fall risk. Baseline: 27 seconds, 13.4 seconds on 12/29/22 Goal status: MET  6.  Pt will improve 5x sit<>stand to less than or equal to 17.5 sec to demonstrate improved functional strength and transfer efficiency.  Baseline: 20.28 seconds without UE support,  11.6 seconds without UE support on 12/29/22 Goal status: MET   LONG TERM GOALS: Target date: 01/29/2023  Pt will be independent with PD specific final HEP in order to build upon functional gains made in therapy. Baseline:  Goal status: INITIAL  2.  Pt will improve MiniBest to 18/28 for decreased fall risk and  improvement with compensatory stepping strategies.   Baseline: 12/28  Goal status: REVISED  3.  Pt will improve 5x sit<>stand to less than or equal to 15 sec to demonstrate improved functional strength and transfer efficiency.  Baseline: 20.28 seconds without UE support; 11.6 seconds without UE support on 12/29/22 Goal status: MET  4.  Pt will improve gait speed with no AD to at least 3.3 ft/sec in order to demo improved community mobility.  Baseline:  11.07 seconds = 2.96 ft/sec Goal status: INITIAL  5.   Pt will improve manual TUG time to 17 seconds or less with supervision in order to demo decrease fall risk. Baseline: 27 seconds  Goal status: INITIAL  6.  Pt will verbalize understanding of community resources for PD.  Baseline:  Goal status: INITIAL  ASSESSMENT:  CLINICAL IMPRESSION: Today's skilled session focused on standing PWR moves for balance/weight shifting/larger amplitude movements, standing balance with focus on weight shifting and stepping strategies. Pt needing multi-modal cues for sequencing alternating stepping with cues for larger steps, esp with RLE. Cues still needed during gait for posture and reciprocal arm swing. Limited carryover noted between sessions. Will continue per POC.   OBJECTIVE IMPAIRMENTS: Abnormal gait, decreased activity tolerance, decreased balance, decreased coordination, decreased mobility, difficulty walking, decreased strength, impaired flexibility, impaired tone, and postural dysfunction.   ACTIVITY LIMITATIONS: bending, stairs, transfers, dressing, and locomotion level  PARTICIPATION LIMITATIONS: driving and community activity  PERSONAL FACTORS: Behavior pattern, Past/current experiences, Time since onset of injury/illness/exacerbation, and 3+ comorbidities: HTN, Diabetes, HLD, newly diagnosed PD  are also affecting patient's functional outcome.   REHAB POTENTIAL: Good  CLINICAL DECISION MAKING: Evolving/moderate complexity  EVALUATION COMPLEXITY: Moderate  PLAN:  PT FREQUENCY: 2x/week  PT DURATION: 8 weeks  PLANNED INTERVENTIONS: Therapeutic exercises, Therapeutic activity, Neuromuscular re-education, Balance training, Gait training, Patient/Family education, Self Care, Stair training, Vestibular training, DME instructions, and Re-evaluation  PLAN FOR NEXT SESSION: balance deficits found on miniBEST - esp stepping strategies! Rollator worked okay- was not significant enough to  recommend it.  Gait training with larger amplitude movements and arm swing. WORK ON TURNS!!!!, UNLEVEL SURFACES, resisted gait, slam ball activities, anterior weight shifting    Arliss Journey, PT, DPT  01/12/2023, 4:34 PM

## 2023-01-13 ENCOUNTER — Ambulatory Visit (INDEPENDENT_AMBULATORY_CARE_PROVIDER_SITE_OTHER): Payer: Medicare Other | Admitting: Podiatry

## 2023-01-13 DIAGNOSIS — Z91199 Patient's noncompliance with other medical treatment and regimen due to unspecified reason: Secondary | ICD-10-CM

## 2023-01-14 ENCOUNTER — Encounter: Payer: Self-pay | Admitting: Occupational Therapy

## 2023-01-14 ENCOUNTER — Encounter: Payer: Self-pay | Admitting: Physical Therapy

## 2023-01-14 ENCOUNTER — Ambulatory Visit: Payer: Medicare Other | Admitting: Occupational Therapy

## 2023-01-14 ENCOUNTER — Ambulatory Visit: Payer: Medicare Other | Admitting: Physical Therapy

## 2023-01-14 DIAGNOSIS — R278 Other lack of coordination: Secondary | ICD-10-CM

## 2023-01-14 DIAGNOSIS — R2681 Unsteadiness on feet: Secondary | ICD-10-CM

## 2023-01-14 DIAGNOSIS — R41840 Attention and concentration deficit: Secondary | ICD-10-CM

## 2023-01-14 DIAGNOSIS — R41842 Visuospatial deficit: Secondary | ICD-10-CM

## 2023-01-14 DIAGNOSIS — M6281 Muscle weakness (generalized): Secondary | ICD-10-CM

## 2023-01-14 DIAGNOSIS — R29818 Other symptoms and signs involving the nervous system: Secondary | ICD-10-CM

## 2023-01-14 DIAGNOSIS — R293 Abnormal posture: Secondary | ICD-10-CM

## 2023-01-14 NOTE — Therapy (Signed)
OUTPATIENT PHYSICAL THERAPY NEURO TREATMENT    Patient Name: Edward Briggs MRN: [id number] DOB:28-Sep-1951, 72 y.o., male Today's Date: 01/14/2023   PCP: [name], MD  REFERRING PROVIDER:  [name], MD    END OF SESSION:  PT End of Session - 01/14/23 1450     Visit Number 13    Number of Visits 17    Date for PT Re-Evaluation 02/02/23    Authorization Type [employer] Medicare    Progress Note Due on Visit 9   PN done on visit 9   PT Start Time 1448    PT Stop Time 1529    PT Time Calculation (min) 41 min    Equipment Utilized During Treatment Gait belt    Activity Tolerance Patient tolerated treatment well    Behavior During Therapy WFL for tasks assessed/performed                   Past Medical History:  Diagnosis Date   Diabetes mellitus without complication ([place])    GERD (gastroesophageal reflux disease)    no meds   Hypertension    controlled on meds   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   Past Surgical History:  Procedure Laterality Date   ABSCESS DRAINAGE     COLONOSCOPY     ~ 10 yrs ago   [place]  03/26/2012   Procedure: HARDWARE REMOVAL;  Surgeon: [name], MD;  Location: [place] [facility];  Service: Orthopedics;  Laterality: Left;  hardware removal deep left ankle syndesmotic screws only and stress xrays ankle    ORIF ANKLE FRACTURE  12/03/2011   Procedure: OPEN REDUCTION INTERNAL FIXATION (ORIF) ANKLE FRACTURE;  Surgeon: [name], MD;  Location: [place];  Service: Orthopedics;  Laterality: Left;  left medial malleolus fracture   PILONIDAL CYST EXCISION  ~1982   SKIN GRAFT  ~1970   Lt great toe (graft from Lt thigh)   Patient Active Problem List   Diagnosis Date Noted   HTN (hypertension) 123456   Acute metabolic encephalopathy 123456   AKI (acute kidney injury) ([place]) 07/22/2019   Sepsis ([place]) 07/22/2019   UTI (urinary tract infection) 07/22/2019   Diabetes  mellitus without complication ([place]) 123XX123   CHEST PAIN 07/21/2009    ONSET DATE: 12/01/2022   REFERRING DIAG: G20.A1 (ICD-10-CM) - Parkinson's disease without dyskinesia, unspecified whether manifestations fluctuate   THERAPY DIAG:  Muscle weakness (generalized)  Other symptoms and signs involving the nervous system  Other lack of coordination  Abnormal posture  Unsteadiness on feet  Rationale for Evaluation and Treatment: Rehabilitation  SUBJECTIVE:  SUBJECTIVE STATEMENT: Reports balance is feeling better. No falls.   Pt accompanied by:  Spouse, [name]    PERTINENT HISTORY: PMH: HTN, Diabetes, HLD   Pt saw neurologist [name] on 12/01/22 for memory impairment, per 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 Pt started on Sinemet   PAIN:  Are you having pain? Yes: NPRS scale: 3/10 Pain location: Low back pain    PRECAUTIONS: Fall  WEIGHT BEARING RESTRICTIONS: No  FALLS: Has patient fallen in last 6 months? Yes. Number of falls 2  LIVING ENVIRONMENT: Lives with: lives with their spouse Lives in: House/apartment Stairs: Yes: Internal: 10 steps; on right going up and External: 4 steps; on right going up Has following equipment at home: None  PLOF: Independent  PATIENT GOALS: Wants to get back to normal.   OBJECTIVE:   DIAGNOSTIC FINDINGS: last brain imaging was a CT of the head done in September 2020 and showed some cerebral atrophy some small vessel white matter disease but no acute intracranial pathology.   COGNITION: Overall cognitive status:  Reports some memory changes.    SENSATION: WFL  COORDINATION: Bradykinesia RLE>LLE    MUSCLE TONE: RLE:  Rigidity   POSTURE: rounded shoulders, forward head, posterior pelvic tilt, and flexed trunk    LOWER EXTREMITY MMT:    MMT Right Eval Left Eval  Hip flexion 4+ 4  Hip extension    Hip abduction 5 5  Hip adduction 4+ 4+  Hip internal rotation    Hip external rotation    Knee flexion 5 5  Knee extension 4+ 4+  Ankle dorsiflexion 4+ 4+  Ankle plantarflexion    Ankle inversion    Ankle eversion    (Blank rows = not tested)  All tested in sitting.   TODAY'S TREATMENT:    GAIT Gait pattern: step through pattern, decreased arm swing- Right, decreased arm swing- Left, decreased stride length, decreased hip/knee flexion- Right, decreased hip/knee flexion- Left, decreased ankle dorsiflexion- Right, decreased ankle dorsiflexion- Left, shuffling, festinating, decreased trunk rotation, and poor foot clearance- Right Distance walked: Clinic distances  Assistive device utilized: Walker - 4 wheeled Level of assistance: SBA  Comments:With no AD, cued for posture, step length and arm swing. Educated on importance of arm swing with gait for trunk rotation and balance. Also educated to try to slow down gait speed, but pt at times walks too fast and has tendency to have too much of a forward lean.   Ther Ex  SciFit multi-peaks level 4.0 for 8 minutes using BUE/BLEs for neural priming for reciprocal movement, dynamic cardiovascular warmup and increased amplitude of stepping. RPE of 6-7/10    NMR  With use of 6 blaze pods on mirror in lateral, superior lateral, and inferior lateral directions for trunk rotations, visual scanning, head motions, larger amplitude movement patterns, reaction time, working on tapping single color. Performed with wider BOS for weight shifting  1 minute: 20 hits on level ground with reaching with ipsilateral arm  1 minute: 25 hits on air ex with reaching with ipsilateral arm  Progressed to standing on air ex with trunk rotation and reaching across body:  2 bouts of 1  minute: 14 hits, 20 hits  Standing tall against wall in PWR Up posture 3 x 30 seconds, cues for tall posture and arms back to the wall and knee extension. Pt reporting incr soreness in shoulders when performing. Discussed  importance of performing this daily   Using 6 blaze pods in a semi-circle around air ex for improved stepping/SLS,  visual scanning, and balance. Performing without UE support, cues to fully step foot on and off air ex pad vs. Sliding it  1 minute: 26 hits 1 minute: 35 hits   PATIENT EDUCATION: Education details: Continue HEP, importance of posture and arm swing with gait, provided handout on local community PD resources and went over with pt and pt's spouse. Performing standing tall against wall stretch with PWR arms daily for improved posture and anterior chest stretch  Person educated: Patient and Spouse Education method: Explanation, Demonstration, Tactile cues, and Verbal cues Education comprehension: verbalized understanding, tactile cues required, and needs further education  HOME EXERCISE PROGRAM: Access Code: [email or web address] URL: [email or web address] Date: 12/08/2022 Prepared by: Mickie Bail Plaster  Exercises - Sit to Stand Without Arm Support  - 1 x daily - 7 x weekly - 3 sets - 10 reps - Standing Reach to Opposite Side with Weight Shift  - 1 x daily - 7 x weekly - 3 sets - 10 reps - Seated Windmill Trunk Rotation Stretch  - 1 x daily - 7 x weekly - 3 sets - 10 reps - Step sideways with arms reaching at counter   - 1 x daily - 7 x weekly - 3 sets - 10 reps - Sidelying Thoracic Rotation with Open Book  - 1 x daily - 7 x weekly - 3 sets - 10 reps - Cervical Retraction at Wall  - 1 x daily - 7 x weekly - 3-5 reps - 30 second hold - Sit to stand with band pull-apart   - 1 x daily - 7 x weekly - 3 sets - 10 reps - Lower Trunk Rotation Stretch  - 1 x daily - 7 x weekly - 4-5 reps - 20-30 second hold  GOALS: Goals reviewed with patient? Yes  SHORT TERM GOALS:  Target date: 01/01/2023  Pt will be independent with PD specific initial HEP in order to build upon functional gains made in therapy. Baseline: pt reports performing consistently at home.  Goal status: MET  2.  Pt will improve MiniBest to 15/28 for decreased fall risk and improvement with compensatory stepping strategies.   Baseline: 12/28 on 2/5; 15/28 on 12/31/22 Goal status: MET  3.  Pt will verbalize understanding of fall prevention in the home.  Baseline: provided handout on 01/12/23 Goal status: MET  4.  Pt will verbalize/demonstrate understanding of freezing techniques Baseline: reviewed and provided handout on 12/29/22 Goal status: MET  5.  Pt will improve manual TUG time to 21 seconds or less in order to demo decrease fall risk. Baseline: 27 seconds, 13.4 seconds on 12/29/22 Goal status: MET  6.  Pt will improve 5x sit<>stand to less than or equal to 17.5 sec to demonstrate improved functional strength and transfer efficiency.  Baseline: 20.28 seconds without UE support,  11.6 seconds without UE support on 12/29/22 Goal status: MET   LONG TERM GOALS: Target date: 01/29/2023  Pt will be independent with PD specific final HEP in order to build upon functional gains made in therapy. Baseline:  Goal status: INITIAL  2.  Pt will improve MiniBest to 18/28 for decreased fall risk and improvement with compensatory stepping strategies.   Baseline: 12/28  Goal status: REVISED  3.  Pt will improve 5x sit<>stand to less than or equal to 15 sec to demonstrate improved functional strength and transfer efficiency.  Baseline: 20.28  seconds without UE support; 11.6 seconds without UE support on 12/29/22 Goal status: MET  4.  Pt will improve gait speed with no AD to at least 3.3 ft/sec in order to demo improved community mobility.  Baseline: 11.07 seconds = 2.96 ft/sec Goal status: INITIAL  5.   Pt will improve manual TUG time to 17 seconds or less with supervision in order to demo  decrease fall risk. Baseline: 27 seconds  Goal status: INITIAL  6.  Pt will verbalize understanding of community resources for PD.  Baseline:  Goal status: INITIAL  ASSESSMENT:  CLINICAL IMPRESSION: Provided information about local community resources for PD. Pt to look at these at home and will report if he has any questions. Remainder of session focused on standing balance with use of blaze pods for trunk rotation and SLS, and standing posture against the wall. Educated on performing posture against the wall daily. Will continue per POC.   OBJECTIVE IMPAIRMENTS: Abnormal gait, decreased activity tolerance, decreased balance, decreased coordination, decreased mobility, difficulty walking, decreased strength, impaired flexibility, impaired tone, and postural dysfunction.   ACTIVITY LIMITATIONS: bending, stairs, transfers, dressing, and locomotion level  PARTICIPATION LIMITATIONS: driving and community activity  PERSONAL FACTORS: Behavior pattern, Past/current experiences, Time since onset of injury/illness/exacerbation, and 3+ comorbidities: HTN, Diabetes, HLD, newly diagnosed PD  are also affecting patient's functional outcome.   REHAB POTENTIAL: Good  CLINICAL DECISION MAKING: Evolving/moderate complexity  EVALUATION COMPLEXITY: Moderate  PLAN:  PT FREQUENCY: 2x/week  PT DURATION: 8 weeks  PLANNED INTERVENTIONS: Therapeutic exercises, Therapeutic activity, Neuromuscular re-education, Balance training, Gait training, Patient/Family education, Self Care, Stair training, Vestibular training, DME instructions, and Re-evaluation  PLAN FOR NEXT SESSION: balance deficits found on miniBEST - esp stepping strategies! Rollator worked okay- was not significant enough to recommend it.  Gait training with larger amplitude movements and arm swing. WORK ON TURNS!!!!, UNLEVEL SURFACES, resisted gait, slam ball activities, anterior weight shifting    Arliss Journey, PT, DPT  01/14/2023,  3:31 PM

## 2023-01-14 NOTE — Therapy (Unsigned)
OUTPATIENT OCCUPATIONAL THERAPY PARKINSON'S TREATMENT  Patient Name: Edward Briggs MRN: [id number] DOB:08/07/1951, 72 y.o., male Today's Date: 01/14/2023  PCP: [name], MD  REFERRING PROVIDER: [name], MD   END OF SESSION:  OT End of Session - 01/14/23 1533      Visit Number 7    Number of Visits 25     Date for OT Re-Evaluation 03/06/23     Authorization Type [employer] Medicare     Progress Note Due on Visit 10     OT Start Time 1533    OT Stop Time 1612     OT Time Calculation (min) 39 min     Activity Tolerance Patient tolerated treatment well     Behavior During Therapy WFL for tasks assessed/performed     Past Medical History:  Diagnosis Date   Diabetes mellitus without complication ([place])    GERD (gastroesophageal reflux disease)    no meds   Hypertension    controlled on meds   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   Past Surgical History:  Procedure Laterality Date   ABSCESS DRAINAGE     COLONOSCOPY     ~ 10 yrs ago   [place]  03/26/2012   Procedure: HARDWARE REMOVAL;  Surgeon: [name], MD;  Location: [place];  Service: Orthopedics;  Laterality: Left;  hardware removal deep left ankle syndesmotic screws only and stress xrays ankle    ORIF ANKLE FRACTURE  12/03/2011   Procedure: OPEN REDUCTION INTERNAL FIXATION (ORIF) ANKLE FRACTURE;  Surgeon: [name], MD;  Location: [facility];  Service: Orthopedics;  Laterality: Left;  left medial malleolus fracture   PILONIDAL CYST EXCISION  ~1982   SKIN GRAFT  ~1970   Lt great toe (graft from Lt thigh)   Patient Active Problem List   Diagnosis Date Noted   HTN (hypertension) 123456   Acute metabolic encephalopathy 123456   AKI (acute kidney injury) ([place]) 07/22/2019   Sepsis ([place]) 07/22/2019   UTI (urinary tract infection) 07/22/2019   Diabetes mellitus without complication ([place]) 123XX123   CHEST PAIN 07/21/2009    ONSET DATE:  12/01/2022  REFERRING DIAG: G20.A1 (ICD-10-CM) - Parkinson's disease without dyskinesia, unspecified whether manifestations fluctuate   THERAPY DIAG:  Muscle weakness (generalized)  Other symptoms and signs involving the nervous system  Other lack of coordination  Abnormal posture  Visuospatial deficit  Attention and concentration deficit  Rationale for Evaluation and Treatment: Rehabilitation  SUBJECTIVE:   SUBJECTIVE STATEMENT:  He has not been consistently completing HEP. He did forget to wear a button up shirt today.   Pt accompanied by: significant other - [name]  PERTINENT HISTORY:   Per PT eval, " PMH: HTN, Diabetes, HLD    Pt saw neurologist [name] on 12/01/22 for memory impairment, per 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 Pt started on Sinemet.  Noticed a difference in his walking about 4-6 weeks ago. Was started on Sinemet and started taking it (1/31). Has been taking small, shuffled steps. Notices that he will lean forward at times. Getting up from a chair is getting more difficult. Has had 2 falls - one he was getting up and his lost balance, 2nd fall was down the stairs and missed a step."    PRECAUTIONS: Fall  WEIGHT BEARING RESTRICTIONS: No  PAIN:  Are you having pain? No  FALLS: Has patient fallen in last 6 months? Yes. Number of falls 2  LIVING ENVIRONMENT: Lives with: lives with their spouse Lives in: House/apartment Stairs: Yes: Internal: 10 steps; on right going up and External: 4 steps; on right going up Has following equipment at home: None   PLOF: Independent   PATIENT GOALS: Wants to get back to normal.   OBJECTIVE:   HAND DOMINANCE: Right  ADLs: Transfers/ambulation related to ADLs: Eating: mod I Grooming: I UB Dressing: min A LB Dressing: mod A Toileting: min A with buttoning pants Bathing: Distance  supervision  Tub Shower transfers: distance supervision Equipment: Careers information officer [place]  IADLs: Shopping: same as PLOF Light housekeeping: mod I Meal Prep: light meal prep, has only been cooking breakfast Community mobility: driving, short distance, no highway Medication management: min A Financial management: I Handwriting: 25% legible  MOBILITY STATUS: Needs Assist: SBA  ACTIVITY TOLERANCE: Activity tolerance: good  FUNCTIONAL OUTCOME MEASURES: Fastening/unfastening 3 buttons: 1:55  Physical performance test: PPT#2 (simulated eating) 27 seconds R = 1 & PPT#4 (donning/doffing jacket): 27 seconds = 1  COORDINATION: 9 Hole Peg test: Right: 55 sec; Left: 42 sec Box and Blocks:  Right 26 blocks, Left 31blocks  Grip strength: 73.4 lbs R ; 74.5 lbs L  UE ROM:  WFL  UE MMT:   WFL  SENSATION: WFL  MUSCLE TONE: RUE: Moderate more prominent in shoulder and LUE: Mild more prominent in elbow  COGNITION: Overall cognitive status: Impaired - impaired motor planning, processing,  OBSERVATIONS: Bradykinesia and Pill rolling L hand;  Difficulty following commands  TODAY'S TREATMENT:         - Self-care/home management completed for duration as noted below including:  OT educated pt on ways to prevent future PD related complications as well as standing and sitting PWR UP and TWIST large amplitude movements prior to and as needed during activities to help with movement quality and accuracy with activity completion.   PATIENT EDUCATION: Education details:large amplitude movements; preventing PD complications Person educated: Patient and Spouse Education method: Customer service manager, verbal cues, demonstration, handouts Education comprehension: verbalized understanding, returned demonstration, and needs further education  HOME EXERCISE PROGRAM: 12/22/22 coordination HEP 3/13: PWR UP and TWIST in sitting and standing  GOALS:  SHORT TERM GOALS: Target date: 01/05/2023    Pt  will be independent with PD specific HEP.  Baseline: not yet initiated Goal status: IN PROGRESS  2.  Pt will verbalize understanding of adapted strategies to maximize safety and independence with ADLs/IADLs.  Baseline: not yet initiated Goal status: IN PROGRESS  3.  Pt will write a sentence with no significant decrease in size and maintain 75% legibility.  Baseline: 25% legibility Goal status: INITIAL  4.  Pt will demonstrate improved ease with fastening buttons as evidenced by decreasing 3 button/unbutton time by 5 seconds  Baseline: 1 minute 55 seconds Goal status: INITIAL    LONG TERM GOALS: Target date: 03/06/2023    Pt will verbalize understanding of ways to prevent future PD related complications and PD community resources.  Baseline: not yet initiated Goal status: INITIAL  2.  Pt will write a short paragraph with no significant decrease in size and maintain 100% legibility.  Baseline: 25% legibility Goal status: INITIAL  3.  Pt will verbalize understanding of ways to keep thinking skills sharp and ways to compensate for STM changes in the future.  Baseline: not yet initiated Goal status: INITIAL  4.  Pt will  demonstrate improved ease with feeding as evidenced by decreasing PPT#2 by 3 secs.  Baseline: 27 secs Goal status: INITIAL  5.  Pt will demonstrate increased ease with dressing as evidenced by decreasing PPT#4 (don/ doff jacket) to 24 secs or less.  Baseline: 27 secs Goal status: INITIAL  6.  Pt will demonstrate improved fine motor coordination for ADLs as evidenced by decreasing 9 hole peg test score for each hand by 5 secs  Baseline: Right: 55 sec; Left: 42 sec  Goal status: INITIAL   7.  Pt will demonstrate improved ease with fastening buttons as evidenced by decreasing 3 button/unbutton time by 10 seconds  Baseline: 1 minute 55 seconds seconds Goal status: INITIAL  8.  Pt will be able to place at least 29 blocks using R hand with completion of  Box and Blocks test.  Baseline: 26 blocks Goal status: INITIAL    ASSESSMENT:  CLINICAL IMPRESSION: Pt is progressing towards goals. Pt/ wife verbalize understanding, however pt may benefit from reinforcement. Remains good candidate for skilled OT services to improve independence and safety with ADLs and IADLs.  PERFORMANCE DEFICITS: in functional skills including ADLs, IADLs, coordination, tone, Fine motor control, Gross motor control, mobility, balance, body mechanics, and UE functional use, cognitive skills including attention, perception, problem solving, sequencing, and understand  IMPAIRMENTS: are limiting patient from ADLs, IADLs, and leisure.   COMORBIDITIES:  has co-morbidities such as HTN, HLD, DM  that affects occupational performance. Patient will benefit from skilled OT to address above impairments and improve overall function.  OT OCCUPATIONAL PROFILE AND HISTORY: Detailed assessment: Review of records and additional review of physical, cognitive, psychosocial history related to current functional performance.  REHAB POTENTIAL: Fair given PD diagnosis   PLAN:  OT FREQUENCY: 2x/week  OT DURATION: 12 weeks  PLANNED INTERVENTIONS: self care/ADL training, therapeutic exercise, therapeutic activity, neuromuscular re-education, manual therapy, functional mobility training, ultrasound, paraffin, fluidotherapy, moist heat, patient/family education, cognitive remediation/compensation, visual/perceptual remediation/compensation, energy conservation, coping strategies training, DME and/or AE instructions, and Re-evaluation  RECOMMENDED OTHER SERVICES: none at this time  CONSULTED AND AGREED WITH PLAN OF CARE: Patient and family member/caregiver  PLAN FOR NEXT SESSION: Review PWR UP and TWIST; ADL strategies, donning jacket, handwriting with foam grip-issue and review handout for writing with PD (have pt wear reading gls if he has them)  Dennis Bast, OT 01/14/2023, 2:30 PM

## 2023-01-14 NOTE — Patient Instructions (Addendum)
Ways to prevent future Parkinson's related complications:  1.   Exercise regularly/daily.    2.   Focus on BIGGER movements during daily activities- really reach overhead, straighten elbows and extend fingers  3.   When dressing (especially jacket/coat) or reaching for your seatbelt make sure to use your body to assist by twisting while you reach and looking at where you are reaching - this can help to minimize stress on the shoulder and reduce the risk of a rotator cuff tear  4.   Swing your arms when you walk! People with PD are at increased risk for frozen shoulder and swinging your arms can reduce this risk.  5. Drink plenty of water and eat a high fiber diet  6. Do NOT take your Parkinson's medication around meals (avoid taking 30 min before a meal, or 1 hour after a meal), especially protein as it blocks the absorption of the medicine and will not work effectively  7. Keep your feet apart when you are standing (wider stance) to allow you to have better balance and to reach further with your arms. Also make sure your feet are apart before standing up.

## 2023-01-16 ENCOUNTER — Encounter: Payer: Self-pay | Admitting: Podiatry

## 2023-01-16 ENCOUNTER — Ambulatory Visit (INDEPENDENT_AMBULATORY_CARE_PROVIDER_SITE_OTHER): Payer: Medicare Other | Admitting: Podiatry

## 2023-01-16 ENCOUNTER — Telehealth: Payer: Self-pay | Admitting: Family Medicine

## 2023-01-16 ENCOUNTER — Other Ambulatory Visit: Payer: Self-pay

## 2023-01-16 VITALS — BP 143/84 | HR 68

## 2023-01-16 DIAGNOSIS — M79674 Pain in right toe(s): Secondary | ICD-10-CM | POA: Diagnosis not present

## 2023-01-16 DIAGNOSIS — M79675 Pain in left toe(s): Secondary | ICD-10-CM | POA: Diagnosis not present

## 2023-01-16 DIAGNOSIS — E119 Type 2 diabetes mellitus without complications: Secondary | ICD-10-CM

## 2023-01-16 DIAGNOSIS — I10 Essential (primary) hypertension: Secondary | ICD-10-CM

## 2023-01-16 DIAGNOSIS — B351 Tinea unguium: Secondary | ICD-10-CM | POA: Diagnosis not present

## 2023-01-16 MED ORDER — LISINOPRIL 5 MG PO TABS: 5.0000 mg | ORAL_TABLET | Freq: Every day | ORAL | 1 refills | Status: DC

## 2023-01-16 NOTE — Telephone Encounter (Signed)
Encourage patient to contact the pharmacy for refills or they can request refills through [facility]  (Please schedule appointment if patient has not been seen in over a year) Last ov 07/03/22  WHAT PHARMACY WOULD THEY LIKE THIS SENT TO:  [facility] ([place]), [place] - [address]    MEDICATION NAME & DOSE: lisinopril (ZESTRIL) 5 MG tablet   NOTES/COMMENTS FROM PATIENT:      [facility] office please notify patient: It takes 48-72 hours to process rx refill requests Ask patient to call pharmacy to ensure rx is ready before heading there.

## 2023-01-16 NOTE — Telephone Encounter (Signed)
Sent, called patient to inform LM

## 2023-01-16 NOTE — Progress Notes (Signed)
  Subjective:  Patient ID: Edward Briggs, male    DOB: Aug 06, 1951,  MRN: [id number]  Edward Briggs presents to clinic today for preventative diabetic foot care and painful elongated mycotic toenails 1-5 bilaterally which are tender when wearing enclosed shoe gear. Pain is relieved with periodic professional debridement.  Chief Complaint  Patient presents with   Nail Problem    Trim,A1C:8.1,pcp-Briggs,Edward   New problem(s): None.   PCP is Edward Agreste, MD.  No Known Allergies  Review of Systems: Negative except as noted in the HPI.  Objective: No changes noted in today's physical examination. Vitals:   01/16/23 0748  BP: (!) 143/84  Pulse: [place] is a pleasant 72 y.o. male obese in NAD. AAO x 3.  Vascular Examination: Capillary refill time immediate b/l.Vascular status intact b/l with palpable pedal pulses. Pedal hair sparse b/l. No edema. No pain with calf compression b/l. Skin temperature gradient WNL b/l.   Neurological Examination: Sensation grossly intact b/l with 10 gram monofilament. Vibratory sensation intact b/l.   Dermatological Examination: Pedal skin with normal turgor, texture and tone b/l. Toenails 1-5 b/l thick, discolored, elongated with subungual debris and pain on dorsal palpation. Incurvated nailplate medial border of right great toe.  Nail border hypertrophy absent. There is tenderness to palpation. Sign(s) of infection: no clinical signs of infection noted on examination today.  Musculoskeletal Examination: Normal muscle strength 5/5 to all lower extremity muscle groups bilaterally. HAV with bunion deformity noted b/l LE.[name] No pain, crepitus or joint limitation noted with ROM b/l LE.  Patient ambulates independently without assistive aids.  Radiographs: None  Assessment/Plan: 1. Pain due to onychomycosis of toenails of both feet   2. Diabetes mellitus without complication ([place])    -Consent given for treatment as described  below: -Examined patient. -Continue foot and shoe inspections daily. Monitor blood glucose per PCP/Endocrinologist's recommendations. -Toenails 1-5 b/l were debrided in length and girth with sterile nail nippers and dremel without iatrogenic bleeding.  -Patient/POA to call should there be question/concern in the interim.   Return in about 3 months (around 04/18/2023).  Marzetta Board, DPM

## 2023-01-16 NOTE — Progress Notes (Signed)
1. No-show for appointment     

## 2023-01-19 ENCOUNTER — Encounter: Payer: Self-pay | Admitting: Physical Therapy

## 2023-01-19 ENCOUNTER — Ambulatory Visit: Payer: Medicare Other | Admitting: Physical Therapy

## 2023-01-19 ENCOUNTER — Ambulatory Visit: Payer: Medicare Other | Admitting: Occupational Therapy

## 2023-01-19 DIAGNOSIS — R2681 Unsteadiness on feet: Secondary | ICD-10-CM | POA: Diagnosis not present

## 2023-01-19 DIAGNOSIS — M6281 Muscle weakness (generalized): Secondary | ICD-10-CM

## 2023-01-19 DIAGNOSIS — R293 Abnormal posture: Secondary | ICD-10-CM

## 2023-01-19 DIAGNOSIS — R29818 Other symptoms and signs involving the nervous system: Secondary | ICD-10-CM

## 2023-01-19 NOTE — Therapy (Signed)
OUTPATIENT PHYSICAL THERAPY NEURO TREATMENT    Patient Name: Edward Briggs MRN: [id number] DOB:1951-01-05, 72 y.o., male Today's Date: 01/19/2023   PCP: [name], MD  REFERRING PROVIDER:  [name], MD    END OF SESSION:  PT End of Session - 01/19/23 1406     Visit Number 14    Number of Visits 17    Date for PT Re-Evaluation 02/02/23    Authorization Type [employer]    Progress Note Due on Visit 9   PN done on visit 9   PT Start Time 1405    PT Stop Time 1445    PT Time Calculation (min) 40 min    Equipment Utilized During Treatment Gait belt    Activity Tolerance Patient tolerated treatment well    Behavior During Therapy WFL for tasks assessed/performed                   Past Medical History:  Diagnosis Date   Diabetes mellitus without complication ([place])    GERD (gastroesophageal reflux disease)    no meds   Hypertension    controlled on meds   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   Past Surgical History:  Procedure Laterality Date   ABSCESS DRAINAGE     COLONOSCOPY     ~ 10 yrs ago   [place]  03/26/2012   Procedure: HARDWARE REMOVAL;  Surgeon: [name], MD;  Location: [facility];  Service: Orthopedics;  Laterality: Left;  hardware removal deep left ankle syndesmotic screws only and stress xrays ankle    ORIF ANKLE FRACTURE  12/03/2011   Procedure: OPEN REDUCTION INTERNAL FIXATION (ORIF) ANKLE FRACTURE;  Surgeon: [name], MD;  Location: [facility];  Service: Orthopedics;  Laterality: Left;  left medial malleolus fracture   PILONIDAL CYST EXCISION  ~1982   SKIN GRAFT  ~1970   Lt great toe (graft from Lt thigh)   Patient Active Problem List   Diagnosis Date Noted   HTN (hypertension) 123456   Acute metabolic encephalopathy 123456   AKI (acute kidney injury) ([place]) 07/22/2019   Sepsis ([place]) 07/22/2019   UTI (urinary tract infection) 07/22/2019   Diabetes  mellitus without complication ([place]) 123XX123   CHEST PAIN 07/21/2009    ONSET DATE: 12/01/2022   REFERRING DIAG: G20.A1 (ICD-10-CM) - Parkinson's disease without dyskinesia, unspecified whether manifestations fluctuate   THERAPY DIAG:  Muscle weakness (generalized)  Other symptoms and signs involving the nervous system  Abnormal posture  Unsteadiness on feet  Rationale for Evaluation and Treatment: Rehabilitation  SUBJECTIVE:  SUBJECTIVE STATEMENT: Nothing new, no falls. Balance is getting better.   Pt accompanied by:  Spouse, [name]    PERTINENT HISTORY: PMH: HTN, Diabetes, HLD   Pt saw neurologist [name] on 12/01/22 for memory impairment, per 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 Pt started on Sinemet   PAIN:  Are you having pain? Yes: NPRS scale: 3/10 Pain location: Low back pain    PRECAUTIONS: Fall  WEIGHT BEARING RESTRICTIONS: No  FALLS: Has patient fallen in last 6 months? Yes. Number of falls 2  LIVING ENVIRONMENT: Lives with: lives with their spouse Lives in: House/apartment Stairs: Yes: Internal: 10 steps; on right going up and External: 4 steps; on right going up Has following equipment at home: None  PLOF: Independent  PATIENT GOALS: Wants to get back to normal.   OBJECTIVE:   DIAGNOSTIC FINDINGS: last brain imaging was a CT of the head done in September 2020 and showed some cerebral atrophy some small vessel white matter disease but no acute intracranial pathology.   COGNITION: Overall cognitive status:  Reports some memory changes.    SENSATION: WFL  COORDINATION: Bradykinesia RLE>LLE    MUSCLE TONE: RLE: Rigidity   POSTURE:  rounded shoulders, forward head, posterior pelvic tilt, and flexed trunk    LOWER EXTREMITY MMT:    MMT Right Eval Left Eval  Hip flexion 4+ 4  Hip extension    Hip abduction 5 5  Hip adduction 4+ 4+  Hip internal rotation    Hip external rotation    Knee flexion 5 5  Knee extension 4+ 4+  Ankle dorsiflexion 4+ 4+  Ankle plantarflexion    Ankle inversion    Ankle eversion    (Blank rows = not tested)  All tested in sitting.   TODAY'S TREATMENT:    GAIT Gait pattern: step through pattern, decreased arm swing- Right, decreased arm swing- Left, decreased stride length, decreased hip/knee flexion- Right, decreased hip/knee flexion- Left, decreased ankle dorsiflexion- Right, decreased ankle dorsiflexion- Left, shuffling, festinating, decreased trunk rotation, and poor foot clearance- Right Distance walked: Clinic distances  Assistive device utilized: Walker - 4 wheeled Level of assistance: SBA  Comments:With no AD, cued for posture, step length and arm swing during session.  Ther Ex  SciFit multi-peaks level 4.0 for 8 minutes using BUE/BLEs for neural priming for reciprocal movement, dynamic cardiovascular warmup and increased amplitude of stepping. RPE of 6-7/10    NMR  With use of 6 blaze pods on blue mat on floor, sideways and forwards to work on lateral stepping, weight shifting, balance, and foot clearance performed with single color tap. Pt needs cues for sideways stepping throughout as pt tries to turn and walk forward to tap. Additional cues to step all the way BIG to target before tapping.  3 bouts of 1 minute: 12 hits, 12 hits, 17 hits  On blue compliant mat: staggered stance wide BOS, with balloon taps with 2nd PT for posture, weight shifting, coordination, dual tasking; performed with each leg posteriorly, performed approx. 2 minutes each time. Cues to alternate between hands when tapping , min guard as needed for balance. Standing on air ex with wide BOS, trunk  rotations and grabbing bean bag from lateral and superior lateral directions for trunk rotations, and then tossing into crate, progressing to stepping off air ex when tossing and then stepping back on, performed approx 20 reps each side.  Cues for wider BOS and incr foot clearance when stepping on and off pad.  On blue foam beam: sit <> stands x10 reps without UE support with PWR Up posture in standing (cues to bring belly button forwards)    PATIENT EDUCATION: Education details: Continue HEP  Person educated: Patient and Spouse Education method: Customer service manager Education comprehension: verbalized understanding, tactile cues required, and needs further education  HOME EXERCISE PROGRAM: Access Code: [email or web address] URL: [email or web address] Date: 12/08/2022 Prepared by: Mickie Bail Plaster  Exercises - Sit to Stand Without Arm Support  - 1 x daily - 7 x weekly - 3 sets - 10 reps - Standing Reach to Opposite Side with Weight Shift  - 1 x daily - 7 x weekly - 3 sets - 10 reps - Seated Windmill Trunk Rotation Stretch  - 1 x daily - 7 x weekly - 3 sets - 10 reps - Step sideways with arms reaching at counter   - 1 x daily - 7 x weekly - 3 sets - 10 reps - Sidelying Thoracic Rotation with Open Book  - 1 x daily - 7 x weekly - 3 sets - 10 reps - Cervical Retraction at Wall  - 1 x daily - 7 x weekly - 3-5 reps - 30 second hold - Sit to stand with band pull-apart   - 1 x daily - 7 x weekly - 3 sets - 10 reps - Lower Trunk Rotation Stretch  - 1 x daily - 7 x weekly - 4-5 reps - 20-30 second hold  GOALS: Goals reviewed with patient? Yes  SHORT TERM GOALS: Target date: 01/01/2023  Pt will be independent with PD specific initial HEP in order to build upon functional gains made in therapy. Baseline: pt reports performing consistently at home.  Goal status: MET  2.  Pt will improve MiniBest to 15/28 for decreased fall risk and improvement with compensatory stepping strategies.    Baseline: 12/28 on 2/5; 15/28 on 12/31/22 Goal status: MET  3.  Pt will verbalize understanding of fall prevention in the home.  Baseline: provided handout on 01/12/23 Goal status: MET  4.  Pt will verbalize/demonstrate understanding of freezing techniques Baseline: reviewed and provided handout on 12/29/22 Goal status: MET  5.  Pt will improve manual TUG time to 21 seconds or less in order to demo decrease fall risk. Baseline: 27 seconds, 13.4 seconds on 12/29/22 Goal status: MET  6.  Pt will improve 5x sit<>stand to less than or equal to 17.5 sec to demonstrate improved functional strength and transfer efficiency.  Baseline: 20.28 seconds without UE support,  11.6 seconds without UE support on 12/29/22 Goal status: MET   LONG TERM GOALS: Target date: 01/29/2023  Pt will be independent with PD specific final HEP in order to build upon functional gains made in therapy. Baseline:  Goal status: INITIAL  2.  Pt will improve MiniBest to 18/28 for decreased fall risk and improvement with compensatory stepping strategies.   Baseline: 12/28  Goal status: REVISED  3.  Pt will improve 5x sit<>stand to less than or equal to 15 sec to demonstrate improved functional strength and transfer efficiency.  Baseline: 20.28 seconds without UE support; 11.6 seconds without UE support on 12/29/22 Goal status: MET  4.  Pt will improve gait speed with no AD to at least 3.3 ft/sec in order to demo improved community mobility.  Baseline: 11.07 seconds = 2.96 ft/sec Goal status: INITIAL  5.   Pt will improve manual TUG  time to 17 seconds or less with supervision in order to demo decrease fall risk. Baseline: 27 seconds  Goal status: INITIAL  6.  Pt will verbalize understanding of community resources for PD.  Baseline:  Goal status: INITIAL  ASSESSMENT:  CLINICAL IMPRESSION: Today's skilled session continued to focus on larger amplitude movements, standing balance on compliant surfaces and trunk  rotations. Pt demonstrating improvements with sit <> stands on air ex without use of UE support. Pt needing cues for side stepping with larger amplitude steps. Will continue per POC.   OBJECTIVE IMPAIRMENTS: Abnormal gait, decreased activity tolerance, decreased balance, decreased coordination, decreased mobility, difficulty walking, decreased strength, impaired flexibility, impaired tone, and postural dysfunction.   ACTIVITY LIMITATIONS: bending, stairs, transfers, dressing, and locomotion level  PARTICIPATION LIMITATIONS: driving and community activity  PERSONAL FACTORS: Behavior pattern, Past/current experiences, Time since onset of injury/illness/exacerbation, and 3+ comorbidities: HTN, Diabetes, HLD, newly diagnosed PD  are also affecting patient's functional outcome.   REHAB POTENTIAL: Good  CLINICAL DECISION MAKING: Evolving/moderate complexity  EVALUATION COMPLEXITY: Moderate  PLAN:  PT FREQUENCY: 2x/week  PT DURATION: 8 weeks  PLANNED INTERVENTIONS: Therapeutic exercises, Therapeutic activity, Neuromuscular re-education, Balance training, Gait training, Patient/Family education, Self Care, Stair training, Vestibular training, DME instructions, and Re-evaluation  PLAN FOR NEXT SESSION: balance deficits found on miniBEST - esp stepping strategies! Rollator worked okay- was not significant enough to recommend it.  Gait training with larger amplitude movements and arm swing. WORK ON TURNS!!!!, UNLEVEL SURFACES, resisted gait, work on obstacles    Arliss Journey, PT, DPT  01/19/2023, 3:28 PM

## 2023-01-21 ENCOUNTER — Ambulatory Visit: Payer: Medicare Other | Admitting: Physical Therapy

## 2023-01-21 ENCOUNTER — Encounter: Payer: Self-pay | Admitting: Physical Therapy

## 2023-01-21 ENCOUNTER — Ambulatory Visit: Payer: Medicare Other | Admitting: Occupational Therapy

## 2023-01-21 ENCOUNTER — Encounter: Payer: Self-pay | Admitting: Occupational Therapy

## 2023-01-21 DIAGNOSIS — R278 Other lack of coordination: Secondary | ICD-10-CM

## 2023-01-21 DIAGNOSIS — R29818 Other symptoms and signs involving the nervous system: Secondary | ICD-10-CM

## 2023-01-21 DIAGNOSIS — R41840 Attention and concentration deficit: Secondary | ICD-10-CM

## 2023-01-21 DIAGNOSIS — R293 Abnormal posture: Secondary | ICD-10-CM

## 2023-01-21 DIAGNOSIS — M6281 Muscle weakness (generalized): Secondary | ICD-10-CM

## 2023-01-21 DIAGNOSIS — R2681 Unsteadiness on feet: Secondary | ICD-10-CM

## 2023-01-21 NOTE — Therapy (Signed)
OUTPATIENT PHYSICAL THERAPY NEURO TREATMENT    Patient Name: Edward Briggs MRN: [id number] DOB:April 29, 1951, 72 y.o., male Today's Date: 01/21/2023   PCP: [name], MD  REFERRING PROVIDER:  [name], MD    END OF SESSION:  PT End of Session - 01/21/23 1407     Visit Number 15    Number of Visits 17    Date for PT Re-Evaluation 02/02/23    Authorization Type [employer] Medicare    Progress Note Due on Visit 9   PN done on visit 9   PT Start Time 1402    PT Stop Time 1444    PT Time Calculation (min) 42 min    Equipment Utilized During Treatment Gait belt    Activity Tolerance Patient tolerated treatment well    Behavior During Therapy WFL for tasks assessed/performed                   Past Medical History:  Diagnosis Date   Diabetes mellitus without complication ([place])    GERD (gastroesophageal reflux disease)    no meds   Hypertension    controlled on meds   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   Past Surgical History:  Procedure Laterality Date   ABSCESS DRAINAGE     COLONOSCOPY     ~ 10 yrs ago   [place]  03/26/2012   Procedure: HARDWARE REMOVAL;  Surgeon: [name], MD;  Location: [facility];  Service: Orthopedics;  Laterality: Left;  hardware removal deep left ankle syndesmotic screws only and stress xrays ankle    ORIF ANKLE FRACTURE  12/03/2011   Procedure: OPEN REDUCTION INTERNAL FIXATION (ORIF) ANKLE FRACTURE;  Surgeon: [name], MD;  Location: [facility];  Service: Orthopedics;  Laterality: Left;  left medial malleolus fracture   PILONIDAL CYST EXCISION  ~1982   SKIN GRAFT  ~1970   Lt great toe (graft from Lt thigh)   Patient Active Problem List   Diagnosis Date Noted   HTN (hypertension) 123456   Acute metabolic encephalopathy 123456   AKI (acute kidney injury) ([place]) 07/22/2019   Sepsis ([place]) 07/22/2019   UTI (urinary tract infection) 07/22/2019   Diabetes  mellitus without complication ([place]) 123XX123   CHEST PAIN 07/21/2009    ONSET DATE: 12/01/2022   REFERRING DIAG: G20.A1 (ICD-10-CM) - Parkinson's disease without dyskinesia, unspecified whether manifestations fluctuate   THERAPY DIAG:  Muscle weakness (generalized)  Other symptoms and signs involving the nervous system  Abnormal posture  Unsteadiness on feet  Rationale for Evaluation and Treatment: Rehabilitation  SUBJECTIVE:  SUBJECTIVE STATEMENT: Had a fall the other night, was going to turn too fast and was not paying attention. Was able to get up on his own. Did not hurt himself.   Pt accompanied by:  Spouse, [name]    PERTINENT HISTORY: PMH: HTN, Diabetes, HLD   Pt saw neurologist [name] on 12/01/22 for memory impairment, per 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 Pt started on Sinemet   PAIN:  Are you having pain? Yes: NPRS scale: 3/10 Pain location: Low back pain    PRECAUTIONS: Fall  WEIGHT BEARING RESTRICTIONS: No  FALLS: Has patient fallen in last 6 months? Yes. Number of falls 2  LIVING ENVIRONMENT: Lives with: lives with their spouse Lives in: House/apartment Stairs: Yes: Internal: 10 steps; on right going up and External: 4 steps; on right going up Has following equipment at home: None  PLOF: Independent  PATIENT GOALS: Wants to get back to normal.   OBJECTIVE:   DIAGNOSTIC FINDINGS: last brain imaging was a CT of the head done in September 2020 and showed some cerebral atrophy some small vessel white matter disease but no acute intracranial pathology.   COGNITION: Overall cognitive status:  Reports some memory changes.     SENSATION: WFL  COORDINATION: Bradykinesia RLE>LLE    MUSCLE TONE: RLE: Rigidity   POSTURE: rounded shoulders, forward head, posterior pelvic tilt, and flexed trunk    LOWER EXTREMITY MMT:    MMT Right Eval Left Eval  Hip flexion 4+ 4  Hip extension    Hip abduction 5 5  Hip adduction 4+ 4+  Hip internal rotation    Hip external rotation    Knee flexion 5 5  Knee extension 4+ 4+  Ankle dorsiflexion 4+ 4+  Ankle plantarflexion    Ankle inversion    Ankle eversion    (Blank rows = not tested)  All tested in sitting.   TODAY'S TREATMENT:    GAIT Gait pattern: step through pattern, decreased arm swing- Right, decreased arm swing- Left, decreased stride length, decreased hip/knee flexion- Right, decreased hip/knee flexion- Left, decreased ankle dorsiflexion- Right, decreased ankle dorsiflexion- Left, shuffling, festinating, decreased trunk rotation, and poor foot clearance- Right Distance walked: Clinic distances  Assistive device utilized: Walker - 4 wheeled Level of assistance: SBA  Comments:With no AD, cued for posture, step length and arm swing during session.  Therapeutic Activity: Discussed will check LTGs next week and determine POC going forwards. Educated on either PD screens or evals in 6 months and benefits of each. Pt agreeable to plan Also discussed possibility of ST for cognition and speech volume, pt interested in this. PT to send referral request   NMR  Access Code: [email or web address] URL: [email or web address] Date: 01/21/2023 Prepared by: Janann August  Reviewed pt's current HEP:   Exercises - Sit to Stand Without Arm Support  - 1 x daily - 7 x weekly - 3 sets - 10 reps - Standing Reach to Opposite Side with Weight Shift  - 1 x daily - 7 x weekly - 3 sets - 10 reps - Seated Windmill Trunk Rotation Stretch  - 1 x daily - 7 x weekly - 3 sets - 10 reps - cues to hold for a couple seconds and to reset with tall posture in the middle  -  Step sideways with arms reaching at counter   - 1 x daily - 7 x weekly -  3 sets - 10 reps - pt had not been performing at home, cues for foot clearance and reaching arm up vs. Out (pt reaching more down), provided new handout for this one for HEP, educated on purpose of this exercise.   - Sidelying Thoracic Rotation with Open Book  - 1 x daily - 7 x weekly - 3 sets - 10 reps - Cervical Retraction at Wall  - 1 x daily - 7 x weekly - 3-5 reps - 30 second hold - performed as standing tall against the wall  - Sit to stand with band pull-apart   - 1 x daily - 7 x weekly - 3 sets - 10 reps - Lower Trunk Rotation Stretch  - 1 x daily - 7 x weekly - 4-5 reps - 20-30 second hold  Worked on quarter turns using clock method (facing feet towards 12, 3, 6:00) and visual cues of floor dots on floor, pt challenged with sequencing this despite max verbal and demo cues, pt with tendency to put both feet on single dot vs. Each foot on a different dot, pt also not turning all the way, attempted to perform 10 reps each side, cues throughout for larger steps with leading with outside foot  With use of 6 blaze pods on floor in a circle in // bars to work on visual scanning, turning, weight shifting, pt needing cues for big steps when turning, pt's movements more bradykinetic when turning to the L  2 bouts of 1 minute: 7 hits, 5 hits   PATIENT EDUCATION: Education details: Reviewed HEP, see therapeutic activity section.  Person educated: Patient and Spouse Education method: Customer service manager Education comprehension: verbalized understanding, tactile cues required, and needs further education  HOME EXERCISE PROGRAM: Access Code: [email or web address] URL: [email or web address] Date: 12/08/2022 Prepared by: Mickie Bail Plaster  Exercises - Sit to Stand Without Arm Support  - 1 x daily - 7 x weekly - 3 sets - 10 reps - Standing Reach to Opposite Side with Weight Shift  - 1 x daily - 7 x weekly - 3 sets - 10  reps - Seated Windmill Trunk Rotation Stretch  - 1 x daily - 7 x weekly - 3 sets - 10 reps - Step sideways with arms reaching at counter   - 1 x daily - 7 x weekly - 3 sets - 10 reps - Sidelying Thoracic Rotation with Open Book  - 1 x daily - 7 x weekly - 3 sets - 10 reps - Cervical Retraction at Wall  - 1 x daily - 7 x weekly - 3-5 reps - 30 second hold - Sit to stand with band pull-apart   - 1 x daily - 7 x weekly - 3 sets - 10 reps - Lower Trunk Rotation Stretch  - 1 x daily - 7 x weekly - 4-5 reps - 20-30 second hold  GOALS: Goals reviewed with patient? Yes  SHORT TERM GOALS: Target date: 01/01/2023  Pt will be independent with PD specific initial HEP in order to build upon functional gains made in therapy. Baseline: pt reports performing consistently at home.  Goal status: MET  2.  Pt will improve MiniBest to 15/28 for decreased fall risk and improvement with compensatory stepping strategies.   Baseline: 12/28 on 2/5; 15/28 on 12/31/22 Goal status: MET  3.  Pt will verbalize understanding of fall prevention in the home.  Baseline: provided handout on 01/12/23 Goal status: MET  4.  Pt will verbalize/demonstrate understanding of freezing  techniques Baseline: reviewed and provided handout on 12/29/22 Goal status: MET  5.  Pt will improve manual TUG time to 21 seconds or less in order to demo decrease fall risk. Baseline: 27 seconds, 13.4 seconds on 12/29/22 Goal status: MET  6.  Pt will improve 5x sit<>stand to less than or equal to 17.5 sec to demonstrate improved functional strength and transfer efficiency.  Baseline: 20.28 seconds without UE support,  11.6 seconds without UE support on 12/29/22 Goal status: MET   LONG TERM GOALS: Target date: 01/29/2023  Pt will be independent with PD specific final HEP in order to build upon functional gains made in therapy. Baseline:  Goal status: INITIAL  2.  Pt will improve MiniBest to 18/28 for decreased fall risk and improvement with  compensatory stepping strategies.   Baseline: 12/28  Goal status: REVISED  3.  Pt will improve 5x sit<>stand to less than or equal to 15 sec to demonstrate improved functional strength and transfer efficiency.  Baseline: 20.28 seconds without UE support; 11.6 seconds without UE support on 12/29/22 Goal status: MET  4.  Pt will improve gait speed with no AD to at least 3.3 ft/sec in order to demo improved community mobility.  Baseline: 11.07 seconds = 2.96 ft/sec Goal status: INITIAL  5.   Pt will improve manual TUG time to 17 seconds or less with supervision in order to demo decrease fall risk. Baseline: 27 seconds  Goal status: INITIAL  6.  Pt will verbalize understanding of community resources for PD.  Baseline:  Goal status: INITIAL  ASSESSMENT:  CLINICAL IMPRESSION: Today's skilled session focused on reviewing HEP with pt needing reminder cues at times and needed review or standing lateral step and reach. Pt needing cues for LLE foot clearance when stepping. Remainder of session focused on working on quarter turn technique to help with improving safety with gait. Pt required multi-modal cues for technique and leading with outside foot when turning with a big step. Pt was challenged by this and will continue to benefit from more practice. Will continue per POC.   OBJECTIVE IMPAIRMENTS: Abnormal gait, decreased activity tolerance, decreased balance, decreased coordination, decreased mobility, difficulty walking, decreased strength, impaired flexibility, impaired tone, and postural dysfunction.   ACTIVITY LIMITATIONS: bending, stairs, transfers, dressing, and locomotion level  PARTICIPATION LIMITATIONS: driving and community activity  PERSONAL FACTORS: Behavior pattern, Past/current experiences, Time since onset of injury/illness/exacerbation, and 3+ comorbidities: HTN, Diabetes, HLD, newly diagnosed PD  are also affecting patient's functional outcome.   REHAB POTENTIAL:  Good  CLINICAL DECISION MAKING: Evolving/moderate complexity  EVALUATION COMPLEXITY: Moderate  PLAN:  PT FREQUENCY: 2x/week  PT DURATION: 8 weeks  PLANNED INTERVENTIONS: Therapeutic exercises, Therapeutic activity, Neuromuscular re-education, Balance training, Gait training, Patient/Family education, Self Care, Stair training, Vestibular training, DME instructions, and Re-evaluation  PLAN FOR NEXT SESSION: turning strategies, blaze pods, check LTGs this week and D/C?    Arliss Journey, PT, DPT  01/21/2023, 7:11 PM

## 2023-01-21 NOTE — Therapy (Signed)
OUTPATIENT OCCUPATIONAL THERAPY PARKINSON'S TREATMENT  Patient Name: Edward Briggs MRN: [id number] DOB:June 30, 1951, 72 y.o., male Today's Date: 01/21/2023  PCP: [name], MD  REFERRING PROVIDER: [name], MD   END OF SESSION:  OT End of Session - 01/21/23 1318     Visit Number 8    Number of Visits 25    Date for OT Re-Evaluation 03/06/23    Authorization Type [employer] Medicare    Progress Note Due on Visit 10    OT Start Time 1315    OT Stop Time 1400    OT Time Calculation (min) 45 min    Activity Tolerance Patient tolerated treatment well    Behavior During Therapy WFL for tasks assessed/performed               Past Medical History:  Diagnosis Date   Diabetes mellitus without complication ([place])    GERD (gastroesophageal reflux disease)    no meds   Hypertension    controlled on meds   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   Past Surgical History:  Procedure Laterality Date   ABSCESS DRAINAGE     COLONOSCOPY     ~ 10 yrs ago   [place]  03/26/2012   Procedure: HARDWARE REMOVAL;  Surgeon: [name], MD;  Location: [facility];  Service: Orthopedics;  Laterality: Left;  hardware removal deep left ankle syndesmotic screws only and stress xrays ankle    ORIF ANKLE FRACTURE  12/03/2011   Procedure: OPEN REDUCTION INTERNAL FIXATION (ORIF) ANKLE FRACTURE;  Surgeon: [name], MD;  Location: [facility];  Service: Orthopedics;  Laterality: Left;  left medial malleolus fracture   PILONIDAL CYST EXCISION  ~1982   SKIN GRAFT  ~1970   Lt great toe (graft from Lt thigh)   Patient Active Problem List   Diagnosis Date Noted   HTN (hypertension) 123456   Acute metabolic encephalopathy 123456   AKI (acute kidney injury) ([place]) 07/22/2019   Sepsis ([place]) 07/22/2019   UTI (urinary tract infection) 07/22/2019   Diabetes mellitus without complication ([place]) 123XX123   CHEST PAIN 07/21/2009     ONSET DATE: 12/01/2022  REFERRING DIAG: G20.A1 (ICD-10-CM) - Parkinson's disease without dyskinesia, unspecified whether manifestations fluctuate   THERAPY DIAG:  Other symptoms and signs involving the nervous system  Abnormal posture  Unsteadiness on feet  Other lack of coordination  Attention and concentration deficit  Rationale for Evaluation and Treatment: Rehabilitation  SUBJECTIVE:   SUBJECTIVE STATEMENT:  No pain. I had a fall about 2 days ago.   Pt accompanied by: significant other - [name]  PERTINENT HISTORY:   Per PT eval, " PMH: HTN, Diabetes, HLD    Pt saw neurologist [name] on 12/01/22 for memory impairment, per 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 Pt started on Sinemet.  Noticed a difference in his walking about 4-6 weeks ago. Was started on Sinemet and started taking it (1/31). Has been taking small, shuffled steps. Notices that he will lean forward at times. Getting up from a chair is getting more difficult. Has had 2 falls - one he was getting up and his lost balance, 2nd fall was down the stairs and missed a step."    PRECAUTIONS: Fall  WEIGHT BEARING RESTRICTIONS: No  PAIN:  Are you having pain? No  FALLS:  Has patient fallen in last 6 months? Yes. Number of falls 2  LIVING ENVIRONMENT: Lives with: lives with their spouse Lives in: House/apartment Stairs: Yes: Internal: 10 steps; on right going up and External: 4 steps; on right going up Has following equipment at home: None   PLOF: Independent   PATIENT GOALS: Wants to get back to normal.   OBJECTIVE:   HAND DOMINANCE: Right  ADLs: Transfers/ambulation related to ADLs: Eating: mod I Grooming: I UB Dressing: min A LB Dressing: mod A Toileting: min A with buttoning pants Bathing: Distance supervision  Tub Shower transfers: distance supervision Equipment:  Careers information officer Cable  IADLs: Shopping: same as PLOF Light housekeeping: mod I Meal Prep: light meal prep, has only been cooking breakfast Community mobility: driving, short distance, no highway Medication management: min A Financial management: I Handwriting: 25% legible  MOBILITY STATUS: Needs Assist: SBA  ACTIVITY TOLERANCE: Activity tolerance: good  FUNCTIONAL OUTCOME MEASURES: Fastening/unfastening 3 buttons: 1:55  Physical performance test: PPT#2 (simulated eating) 27 seconds R = 1 & PPT#4 (donning/doffing jacket): 27 seconds = 1  COORDINATION: 9 Hole Peg test: Right: 55 sec; Left: 42 sec Box and Blocks:  Right 26 blocks, Left 31blocks  Grip strength: 73.4 lbs R ; 74.5 lbs L  UE ROM:  WFL  UE MMT:   WFL  SENSATION: WFL  MUSCLE TONE: RUE: Moderate more prominent in shoulder and LUE: Mild more prominent in elbow  COGNITION: Overall cognitive status: Impaired - impaired motor planning, processing,  OBSERVATIONS: Bradykinesia and Pill rolling L hand;  Difficulty following commands  TODAY'S TREATMENT:         Practiced handwriting - writing name with regular pen but needing cues for writing correct letters. Pt did best when saying letters out loud. Pt also able to maintain letter size when doing this.   Practiced donning/doffing jacket - pt did best donning regular method with feet wide apart for balance, however struggled most w/ doffing sleeves. Cued patient to reach back and meet hands then grab sleeve big and yank off. Pt also cued to twist/turn to doff off each shoulder to prevent shoulder injuries. Simulated activity with bag ex (using towel) to bring behind back and grasp with other hand.  Pt practiced hooking/unhooking 2 buttons of shirt with little to no difficulty, but needed cue to look down at buttons - however pt does have difficulty with top button  Reviewed PWR! Up and Twist seated.  However, cued pt to perform PWR! Standing as directed by P.T. (doing with  chair in front or in corner) Reviewed coordination HEP and had pt return demo   Reviewed medication management and avoiding PD meds w/ meals    PATIENT EDUCATION: Education details: see above Person educated: Patient and Spouse Education method: Customer service manager, verbal cues, demonstration, Education comprehension: verbalized understanding, returned demonstration, and needs further education  HOME EXERCISE PROGRAM: 12/22/22 coordination HEP 3/13: PWR UP and TWIST in sitting and standing 01/21/23: reviewed and issued handwriting strategies (from PD website)   GOALS:  SHORT TERM GOALS: Target date: 01/05/2023    Pt will be independent with PD specific HEP.  Baseline: not yet initiated Goal status: IN PROGRESS  2.  Pt will verbalize understanding of adapted strategies to maximize safety and independence with ADLs/IADLs.  Baseline: not yet initiated Goal status: IN PROGRESS  3.  Pt will write a sentence with no significant decrease in size and maintain 75% legibility.  Baseline: 25% legibility Goal status: INITIAL  4.  Pt will demonstrate improved ease with fastening buttons as evidenced by decreasing 3 button/unbutton time by 5 seconds  Baseline: 1 minute 55 seconds Goal status: INITIAL    LONG TERM GOALS: Target date: 03/06/2023    Pt will verbalize understanding of ways to prevent future PD related complications and PD community resources.  Baseline: not yet initiated Goal status: INITIAL  2.  Pt will write a short paragraph with no significant decrease in size and maintain 100% legibility.  Baseline: 25% legibility Goal status: INITIAL  3.  Pt will verbalize understanding of ways to keep thinking skills sharp and ways to compensate for STM changes in the future.  Baseline: not yet initiated Goal status: INITIAL  4.  Pt will demonstrate improved ease with feeding as evidenced by decreasing PPT#2 by 3 secs.  Baseline: 27 secs Goal status:  INITIAL  5.  Pt will demonstrate increased ease with dressing as evidenced by decreasing PPT#4 (don/ doff jacket) to 24 secs or less.  Baseline: 27 secs Goal status: INITIAL  6.  Pt will demonstrate improved fine motor coordination for ADLs as evidenced by decreasing 9 hole peg test score for each hand by 5 secs  Baseline: Right: 55 sec; Left: 42 sec  Goal status: INITIAL   7.  Pt will demonstrate improved ease with fastening buttons as evidenced by decreasing 3 button/unbutton time by 10 seconds  Baseline: 1 minute 55 seconds seconds Goal status: INITIAL  8.  Pt will be able to place at least 29 blocks using R hand with completion of Box and Blocks test.  Baseline: 26 blocks Goal status: INITIAL    ASSESSMENT:  CLINICAL IMPRESSION: Pt is progressing towards goals. Pt/ wife verbalize understanding, however pt may benefit from reinforcement. Decreased carryover d/t cognitive deficits. Remains good candidate for skilled OT services to improve independence and safety with ADLs and IADLs.  PERFORMANCE DEFICITS: in functional skills including ADLs, IADLs, coordination, tone, Fine motor control, Gross motor control, mobility, balance, body mechanics, and UE functional use, cognitive skills including attention, perception, problem solving, sequencing, and understand  IMPAIRMENTS: are limiting patient from ADLs, IADLs, and leisure.   COMORBIDITIES:  has co-morbidities such as HTN, HLD, DM  that affects occupational performance. Patient will benefit from skilled OT to address above impairments and improve overall function.  OT OCCUPATIONAL PROFILE AND HISTORY: Detailed assessment: Review of records and additional review of physical, cognitive, psychosocial history related to current functional performance.  REHAB POTENTIAL: Fair given PD diagnosis   PLAN:  OT FREQUENCY: 2x/week  OT DURATION: 12 weeks  PLANNED INTERVENTIONS: self care/ADL training, therapeutic exercise, therapeutic  activity, neuromuscular re-education, manual therapy, functional mobility training, ultrasound, paraffin, fluidotherapy, moist heat, patient/family education, cognitive remediation/compensation, visual/perceptual remediation/compensation, energy conservation, coping strategies training, DME and/or AE instructions, and Re-evaluation  RECOMMENDED OTHER SERVICES: none at this time  CONSULTED AND AGREED WITH PLAN OF CARE: Patient and family member/caregiver  PLAN FOR NEXT SESSION: consider bag ex HEP (behind head, behind back, under foot), consider adding more appointments as pt is only scheduled through next week  Hans Eden, OT 01/21/2023, 1:18 PM

## 2023-01-26 ENCOUNTER — Ambulatory Visit: Payer: Medicare Other | Admitting: Physical Therapy

## 2023-01-26 ENCOUNTER — Encounter: Payer: Self-pay | Admitting: Physical Therapy

## 2023-01-26 ENCOUNTER — Ambulatory Visit: Payer: Medicare Other | Admitting: Occupational Therapy

## 2023-01-26 ENCOUNTER — Encounter: Payer: Self-pay | Admitting: Occupational Therapy

## 2023-01-26 DIAGNOSIS — R29818 Other symptoms and signs involving the nervous system: Secondary | ICD-10-CM

## 2023-01-26 DIAGNOSIS — R41842 Visuospatial deficit: Secondary | ICD-10-CM

## 2023-01-26 DIAGNOSIS — M6281 Muscle weakness (generalized): Secondary | ICD-10-CM

## 2023-01-26 DIAGNOSIS — R2681 Unsteadiness on feet: Secondary | ICD-10-CM

## 2023-01-26 DIAGNOSIS — R278 Other lack of coordination: Secondary | ICD-10-CM

## 2023-01-26 DIAGNOSIS — R293 Abnormal posture: Secondary | ICD-10-CM

## 2023-01-26 NOTE — Therapy (Unsigned)
OUTPATIENT OCCUPATIONAL THERAPY PARKINSON'S TREATMENT  Patient Name: Edward Briggs MRN: [id number] DOB:09/18/1951, 72 y.o., male Today's Date: 01/26/2023  PCP: [name], MD  REFERRING PROVIDER: [name], MD   END OF SESSION:  OT End of Session - 01/26/23 1448     Visit Number 9    Number of Visits 25    Date for OT Re-Evaluation 03/06/23    Authorization Type [employer] Medicare    Progress Note Due on Visit 10    OT Start Time 1448    OT Stop Time 1531    OT Time Calculation (min) 43 min    Activity Tolerance Patient tolerated treatment well    Behavior During Therapy WFL for tasks assessed/performed             Past Medical History:  Diagnosis Date   Diabetes mellitus without complication ([place])    GERD (gastroesophageal reflux disease)    no meds   Hypertension    controlled on meds   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   Past Surgical History:  Procedure Laterality Date   ABSCESS DRAINAGE     COLONOSCOPY     ~ 10 yrs ago   [place]  03/26/2012   Procedure: HARDWARE REMOVAL;  Surgeon: [name], MD;  Location: [place];  Service: Orthopedics;  Laterality: Left;  hardware removal deep left ankle syndesmotic screws only and stress xrays ankle    ORIF ANKLE FRACTURE  12/03/2011   Procedure: OPEN REDUCTION INTERNAL FIXATION (ORIF) ANKLE FRACTURE;  Surgeon: [name], MD;  Location: [place];  Service: Orthopedics;  Laterality: Left;  left medial malleolus fracture   PILONIDAL CYST EXCISION  ~1982   SKIN GRAFT  ~1970   Lt great toe (graft from Lt thigh)   Patient Active Problem List   Diagnosis Date Noted   HTN (hypertension) 123456   Acute metabolic encephalopathy 123456   AKI (acute kidney injury) ([place]) 07/22/2019   Sepsis ([place]) 07/22/2019   UTI (urinary tract infection) 07/22/2019   Diabetes mellitus without complication ([place]) 123XX123   CHEST PAIN 07/21/2009    ONSET  DATE: 12/01/2022  REFERRING DIAG: G20.A1 (ICD-10-CM) - Parkinson's disease without dyskinesia, unspecified whether manifestations fluctuate   THERAPY DIAG:  Other symptoms and signs involving the nervous system  Muscle weakness (generalized)  Other lack of coordination  Visuospatial deficit  Rationale for Evaluation and Treatment: Rehabilitation  SUBJECTIVE:   SUBJECTIVE STATEMENT:  No pain. I had a fall about 2 days ago.   Pt accompanied by: significant other - [name]  PERTINENT HISTORY:   Per PT eval, " PMH: HTN, Diabetes, HLD    Pt saw neurologist [name] on 12/01/22 for memory impairment, per 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 Pt started on Sinemet.  Noticed a difference in his walking about 4-6 weeks ago. Was started on Sinemet and started taking it (1/31). Has been taking small, shuffled steps. Notices that he will lean forward at times. Getting up from a chair is getting more difficult. Has had 2 falls - one he was getting up and his lost balance, 2nd fall was down the stairs and missed a step."    PRECAUTIONS: Fall  WEIGHT BEARING RESTRICTIONS: No  PAIN:  Are you having pain? No  FALLS: Has patient fallen in last 6 months?  Yes. Number of falls 3  LIVING ENVIRONMENT: Lives with: lives with their spouse Lives in: House/apartment Stairs: Yes: Internal: 10 steps; on right going up and External: 4 steps; on right going up Has following equipment at home: None   PLOF: Independent   PATIENT GOALS: Wants to get back to normal.   OBJECTIVE:   HAND DOMINANCE: Right  ADLs: Transfers/ambulation related to ADLs: Eating: mod I Grooming: I UB Dressing: min A LB Dressing: mod A Toileting: min A with buttoning pants Bathing: Distance supervision  Tub Shower transfers: distance supervision Equipment: Careers information officer [place]  IADLs: Shopping: same  as PLOF Light housekeeping: mod I Meal Prep: light meal prep, has only been cooking breakfast Community mobility: driving, short distance, no highway Medication management: min A Financial management: I Handwriting: 25% legible  MOBILITY STATUS: Needs Assist: SBA  ACTIVITY TOLERANCE: Activity tolerance: good  FUNCTIONAL OUTCOME MEASURES: Fastening/unfastening 3 buttons: 1:55  Physical performance test: PPT#2 (simulated eating) 27 seconds R = 1 & PPT#4 (donning/doffing jacket): 27 seconds = 1  COORDINATION: 9 Hole Peg test: Right: 55 sec; Left: 42 sec Box and Blocks:  Right 26 blocks, Left 31blocks  Grip strength: 73.4 lbs R ; 74.5 lbs L  UE ROM:  WFL  UE MMT:   WFL  SENSATION: WFL  MUSCLE TONE: RUE: Moderate more prominent in shoulder and LUE: Mild more prominent in elbow  COGNITION: Overall cognitive status: Impaired - impaired motor planning, processing,  OBSERVATIONS: Bradykinesia and Pill rolling L hand;  Difficulty following commands  TODAY'S TREATMENT:         ***  PATIENT EDUCATION: Education details: see above Person educated: Patient and Spouse Education method: Customer service manager, verbal cues, demonstration, Education comprehension: verbalized understanding, returned demonstration, and needs further education  HOME EXERCISE PROGRAM: 12/22/22 coordination HEP 3/13: PWR UP and TWIST in sitting and standing 01/21/23: reviewed and issued handwriting strategies (from PD website)   GOALS:  SHORT TERM GOALS: Target date: 01/05/2023    Pt will be independent with PD specific HEP.  Baseline: not yet initiated Goal status: IN PROGRESS  2.  Pt will verbalize understanding of adapted strategies to maximize safety and independence with ADLs/IADLs.  Baseline: not yet initiated Goal status: IN PROGRESS  3.  Pt will write a sentence with no significant decrease in size and maintain 75% legibility.  Baseline: 25% legibility Goal status:  INITIAL  4.  Pt will demonstrate improved ease with fastening buttons as evidenced by decreasing 3 button/unbutton time by 5 seconds  Baseline: 1 minute 55 seconds Goal status: INITIAL    LONG TERM GOALS: Target date: 03/06/2023    Pt will verbalize understanding of ways to prevent future PD related complications and PD community resources.  Baseline: not yet initiated Goal status: INITIAL  2.  Pt will write a short paragraph with no significant decrease in size and maintain 100% legibility.  Baseline: 25% legibility Goal status: INITIAL  3.  Pt will verbalize understanding of ways to keep thinking skills sharp and ways to compensate for STM changes in the future.  Baseline: not yet initiated Goal status: INITIAL  4.  Pt will demonstrate improved ease with feeding as evidenced by decreasing PPT#2 by 3 secs.  Baseline: 27 secs Goal status: INITIAL  5.  Pt will demonstrate increased ease with dressing as evidenced by decreasing PPT#4 (don/ doff jacket) to 24 secs or less.  Baseline: 27 secs Goal status: INITIAL  6.  Pt will demonstrate improved fine motor coordination  for ADLs as evidenced by decreasing 9 hole peg test score for each hand by 5 secs  Baseline: Right: 55 sec; Left: 42 sec  Goal status: INITIAL   7.  Pt will demonstrate improved ease with fastening buttons as evidenced by decreasing 3 button/unbutton time by 10 seconds  Baseline: 1 minute 55 seconds seconds Goal status: INITIAL  8.  Pt will be able to place at least 29 blocks using R hand with completion of Box and Blocks test.  Baseline: 26 blocks Goal status: INITIAL    ASSESSMENT:  CLINICAL IMPRESSION: Pt is progressing towards goals. Pt/ wife verbalize understanding, however pt may benefit from reinforcement. Decreased carryover d/t cognitive deficits. Remains good candidate for skilled OT services to improve independence and safety with ADLs and IADLs.  PERFORMANCE DEFICITS: in functional skills  including ADLs, IADLs, coordination, tone, Fine motor control, Gross motor control, mobility, balance, body mechanics, and UE functional use, cognitive skills including attention, perception, problem solving, sequencing, and understand  IMPAIRMENTS: are limiting patient from ADLs, IADLs, and leisure.   COMORBIDITIES:  has co-morbidities such as HTN, HLD, DM  that affects occupational performance. Patient will benefit from skilled OT to address above impairments and improve overall function.  OT OCCUPATIONAL PROFILE AND HISTORY: Detailed assessment: Review of records and additional review of physical, cognitive, psychosocial history related to current functional performance.  REHAB POTENTIAL: Fair given PD diagnosis   PLAN:  OT FREQUENCY: 2x/week  OT DURATION: 12 weeks  PLANNED INTERVENTIONS: self care/ADL training, therapeutic exercise, therapeutic activity, neuromuscular re-education, manual therapy, functional mobility training, ultrasound, paraffin, fluidotherapy, moist heat, patient/family education, cognitive remediation/compensation, visual/perceptual remediation/compensation, energy conservation, coping strategies training, DME and/or AE instructions, and Re-evaluation  RECOMMENDED OTHER SERVICES: none at this time  CONSULTED AND AGREED WITH PLAN OF CARE: Patient and family member/caregiver  PLAN FOR NEXT SESSION: consider bag ex HEP (behind head, behind back, under foot), consider adding more appointments as pt is only scheduled through next week  Dennis Bast, OT 01/26/2023, 5:30 PM

## 2023-01-26 NOTE — Therapy (Signed)
OUTPATIENT PHYSICAL THERAPY NEURO TREATMENT    Patient Name: Edward Briggs MRN: [id number] DOB:[birth year], 72 y.o., male Today's Date: 01/26/2023   PCP: [name], MD  REFERRING PROVIDER:  [name], MD    END OF SESSION:  PT End of Session - 01/26/23 1406     Visit Number 16    Number of Visits 17    Date for PT Re-Evaluation 02/02/23    Authorization Type [employer] Medicare    Progress Note Due on Visit 9   PN done on visit 9   PT Start Time 1404    PT Stop Time 1444    PT Time Calculation (min) 40 min    Equipment Utilized During Treatment Gait belt    Activity Tolerance Patient tolerated treatment well    Behavior During Therapy WFL for tasks assessed/performed                   Past Medical History:  Diagnosis Date   Diabetes mellitus without complication ([place])    GERD (gastroesophageal reflux disease)    no meds   Hypertension    controlled on meds   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   Past Surgical History:  Procedure Laterality Date   ABSCESS DRAINAGE     COLONOSCOPY     ~ 10 yrs ago   [place]  03/26/2012   Procedure: HARDWARE REMOVAL;  Surgeon: [name], MD;  Location: [place];  Service: Orthopedics;  Laterality: Left;  hardware removal deep left ankle syndesmotic screws only and stress xrays ankle    ORIF ANKLE FRACTURE  12/03/2011   Procedure: OPEN REDUCTION INTERNAL FIXATION (ORIF) ANKLE FRACTURE;  Surgeon: [name], MD;  Location: [place];  Service: Orthopedics;  Laterality: Left;  left medial malleolus fracture   PILONIDAL CYST EXCISION  ~1982   SKIN GRAFT  ~1970   Lt great toe (graft from Lt thigh)   Patient Active Problem List   Diagnosis Date Noted   HTN (hypertension) 123456   Acute metabolic encephalopathy 123456   AKI (acute kidney injury) ([place]) 07/22/2019   Sepsis ([place]) 07/22/2019   UTI (urinary tract infection) 07/22/2019   Diabetes  mellitus without complication ([place]) 123XX123   CHEST PAIN 07/21/2009    ONSET DATE: 12/01/2022   REFERRING DIAG: G20.A1 (ICD-10-CM) - Parkinson's disease without dyskinesia, unspecified whether manifestations fluctuate   THERAPY DIAG:  Other symptoms and signs involving the nervous system  Abnormal posture  Unsteadiness on feet  Muscle weakness (generalized)  Rationale for Evaluation and Treatment: Rehabilitation  SUBJECTIVE:  SUBJECTIVE STATEMENT: No falls. Watched a lot of basketball over the weekend.   Pt accompanied by:  Spouse, [name]    PERTINENT HISTORY: PMH: HTN, Diabetes, HLD   Pt saw neurologist [name] on 12/01/22 for memory impairment, per 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 Pt started on Sinemet   PAIN:  Are you having pain? Yes: NPRS scale: 3/10 Pain location: Low back pain    PRECAUTIONS: Fall  WEIGHT BEARING RESTRICTIONS: No  FALLS: Has patient fallen in last 6 months? Yes. Number of falls 2  LIVING ENVIRONMENT: Lives with: lives with their spouse Lives in: House/apartment Stairs: Yes: Internal: 10 steps; on right going up and External: 4 steps; on right going up Has following equipment at home: None  PLOF: Independent  PATIENT GOALS: Wants to get back to normal.   OBJECTIVE:   DIAGNOSTIC FINDINGS: last brain imaging was a CT of the head done in September 2020 and showed some cerebral atrophy some small vessel white matter disease but no acute intracranial pathology.   COGNITION: Overall cognitive status:  Reports some memory changes.    SENSATION: WFL  COORDINATION: Bradykinesia RLE>LLE    MUSCLE TONE: RLE: Rigidity   POSTURE:  rounded shoulders, forward head, posterior pelvic tilt, and flexed trunk    LOWER EXTREMITY MMT:    MMT Right Eval Left Eval  Hip flexion 4+ 4  Hip extension    Hip abduction 5 5  Hip adduction 4+ 4+  Hip internal rotation    Hip external rotation    Knee flexion 5 5  Knee extension 4+ 4+  Ankle dorsiflexion 4+ 4+  Ankle plantarflexion    Ankle inversion    Ankle eversion    (Blank rows = not tested)  All tested in sitting.   TODAY'S TREATMENT:    GAIT Gait pattern: step through pattern, decreased arm swing- Right, decreased arm swing- Left, decreased stride length, decreased hip/knee flexion- Right, decreased hip/knee flexion- Left, decreased ankle dorsiflexion- Right, decreased ankle dorsiflexion- Left, shuffling, festinating, decreased trunk rotation, and poor foot clearance- Right Distance walked: Clinic distances  Assistive device utilized: Walker - 4 wheeled Level of assistance: SBA  Comments:With no AD, cued for posture, step length and arm swing during session.  Ther Ex: SciFit multi-peaks level 4.0 for 8 minutes using BUE/BLEs for neural priming for reciprocal movement, dynamic cardiovascular warmup and increased amplitude of stepping. Pt able to keep spm >90.  RPE of 7-8/10   Therapeutic Activity: Goal Assessment: 5x sit <> stand: 12.5 seconds with no UE support  Manual TUG: 15 seconds with no AD Gait speed: 10 seconds with no AD = 3.28 ft/sec   Worked on sit <> stands from lower surfaces (as pt reports difficulty with getting up from lower, softer surfaces), put a 14" block as well as a blue foam pad on top. Worked on backing all the way to the chair, reaching back for slow descent. When getting up, worked on scooting towards edge and using momentum/counting to 3 to help stand from lower surfaces vs. Just one big forward lean if pt feels like he gets stuck. Practiced approx. 10 reps with pt able to perform well. Also performed from fold-out chair (pt also reported  difficulty getting up from these surfaces) x10 reps, pt needing cues to back up to the chair before reaching back. Pt verbalizes understanding of rocking technique/momentum to help with  getting up from lower/softer surfaces.   PATIENT EDUCATION: Education details: Results of goals, sit <> stands from lower surfaces  Person educated: Patient and Spouse Education method: Explanation, Demonstration, and Verbal cues Education comprehension: verbalized understanding, tactile cues required, and needs further education  HOME EXERCISE PROGRAM: Access Code: [email or web address] URL: [email or web address] Date: 12/08/2022 Prepared by: Mickie Bail Plaster  Exercises - Sit to Stand Without Arm Support  - 1 x daily - 7 x weekly - 3 sets - 10 reps - Standing Reach to Opposite Side with Weight Shift  - 1 x daily - 7 x weekly - 3 sets - 10 reps - Seated Windmill Trunk Rotation Stretch  - 1 x daily - 7 x weekly - 3 sets - 10 reps - Step sideways with arms reaching at counter   - 1 x daily - 7 x weekly - 3 sets - 10 reps - Sidelying Thoracic Rotation with Open Book  - 1 x daily - 7 x weekly - 3 sets - 10 reps - Cervical Retraction at Wall  - 1 x daily - 7 x weekly - 3-5 reps - 30 second hold - Sit to stand with band pull-apart   - 1 x daily - 7 x weekly - 3 sets - 10 reps - Lower Trunk Rotation Stretch  - 1 x daily - 7 x weekly - 4-5 reps - 20-30 second hold  GOALS: Goals reviewed with patient? Yes  SHORT TERM GOALS: Target date: 01/01/2023  Pt will be independent with PD specific initial HEP in order to build upon functional gains made in therapy. Baseline: pt reports performing consistently at home.  Goal status: MET  2.  Pt will improve MiniBest to 15/28 for decreased fall risk and improvement with compensatory stepping strategies.   Baseline: 12/28 on 2/5; 15/28 on 12/31/22 Goal status: MET  3.  Pt will verbalize understanding of fall prevention in the home.  Baseline: provided handout on  01/12/23 Goal status: MET  4.  Pt will verbalize/demonstrate understanding of freezing techniques Baseline: reviewed and provided handout on 12/29/22 Goal status: MET  5.  Pt will improve manual TUG time to 21 seconds or less in order to demo decrease fall risk. Baseline: 27 seconds, 13.4 seconds on 12/29/22 Goal status: MET  6.  Pt will improve 5x sit<>stand to less than or equal to 17.5 sec to demonstrate improved functional strength and transfer efficiency.  Baseline: 20.28 seconds without UE support,  11.6 seconds without UE support on 12/29/22 Goal status: MET   LONG TERM GOALS: Target date: 01/29/2023  Pt will be independent with PD specific final HEP in order to build upon functional gains made in therapy. Baseline:  Goal status: INITIAL  2.  Pt will improve MiniBest to 18/28 for decreased fall risk and improvement with compensatory stepping strategies.   Baseline: 12/28  Goal status: REVISED  3.  Pt will improve 5x sit<>stand to less than or equal to 15 sec to demonstrate improved functional strength and transfer efficiency.  Baseline: 20.28 seconds without UE support; 11.6 seconds without UE support on 12/29/22 Goal status: MET  4.  Pt will improve gait speed with no AD to at least 3.3 ft/sec in order to demo improved community mobility.  Baseline: 11.07 seconds = 2.96 ft/sec  10 seconds with no AD = 3.28 ft/sec on 01/26/23 Goal status: MET  5.   Pt will improve manual TUG time to 17 seconds or less with supervision in order to demo decrease fall risk.  Baseline: 27 seconds,15 seconds on 01/26/23 Goal status: MET  6.  Pt will verbalize understanding of community resources for PD.  Baseline:  Goal status: INITIAL  ASSESSMENT:  CLINICAL IMPRESSION: Today's skilled session focused on beginning to assess pt's LTGs. Pt has met LTGs #3, 4, and 5 in regards to gait speed, manual TUG, and 5x sit <> stand.  Pt's time on manual TUG improved from 27 seconds > 15 seconds with pt  also demonstrating improved ability to turn, decr pt's fall risk with this assessment. Remainder of session focused on going over sit <> stands from lower surfaces (per pt request) to simulate couch surfaces and a fold out chair. Pt demonstrated understanding of scooting to the edge and counting to 3/using momentum to help come to stand. Will finish assessing LTGs at next session. Will continue per POC.   OBJECTIVE IMPAIRMENTS: Abnormal gait, decreased activity tolerance, decreased balance, decreased coordination, decreased mobility, difficulty walking, decreased strength, impaired flexibility, impaired tone, and postural dysfunction.   ACTIVITY LIMITATIONS: bending, stairs, transfers, dressing, and locomotion level  PARTICIPATION LIMITATIONS: driving and community activity  PERSONAL FACTORS: Behavior pattern, Past/current experiences, Time since onset of injury/illness/exacerbation, and 3+ comorbidities: HTN, Diabetes, HLD, newly diagnosed PD  are also affecting patient's functional outcome.   REHAB POTENTIAL: Good  CLINICAL DECISION MAKING: Evolving/moderate complexity  EVALUATION COMPLEXITY: Moderate  PLAN:  PT FREQUENCY: 2x/week  PT DURATION: 8 weeks  PLANNED INTERVENTIONS: Therapeutic exercises, Therapeutic activity, Neuromuscular re-education, Balance training, Gait training, Patient/Family education, Self Care, Stair training, Vestibular training, DME instructions, and Re-evaluation  PLAN FOR NEXT SESSION: turning strategies, check remainder of goals, D/C? With PD screens vs. Evals in 6 months?    Arliss Journey, PT, DPT  01/26/2023, 2:46 PM

## 2023-01-26 NOTE — Patient Instructions (Addendum)
Bag Exercises:  Small trash bag or produce bag works best.  For all exercises, sit with big posture (sit up tall with head up) and use big movements. Perform the following exercises 1 times per day.  Hold end of bag in one hand. Stretch fingers out big to draw the entire bag into your palm. Repeat 5 times with each hand. Hold bag in one hand. Stretch both arms/hands out to the side as big as you can. Then, pass bag from one hand to the other IN FRONT of you. Stretch arms back out big after each pass. Repeat 10 times. Hold bag in one hand. Stretch both arms/hands out to the side as big as you can. Then, pass bag from one hand to the other BEHIND you. Stretch arms back out big after each pass. Repeat 10 times. Hold bag in one hand. Stretch both arms/hands out to the side in a diagonal as big as you can. Then, pass bag from one hand to the other IN FRONT of you in a diagonal pattern. Stretch arms back out big after each pass. Repeat 10 times. Hold bag in both hands in front of you with hands/arms shoulder length apart. Move bag behind your head. Repeat 10 times. Hold bag in both hands in front of you with hands/arms shoulder length apart. Lift leg and move bag completely under each foot and back. Repeat 10 times on each side. Hold bag in right hand. Move right hand to reach behind shoulder. Then, reach behind back with left hand to pass bag from right hand to left hand. Switch sides. Repeat 10 times on each side. Hold bag in one hand. Toss bag up and catch with the same/opposite hand. Repeat 10 times with each hand.

## 2023-01-28 ENCOUNTER — Encounter: Payer: Self-pay | Admitting: Occupational Therapy

## 2023-01-28 ENCOUNTER — Encounter: Payer: Self-pay | Admitting: Physical Therapy

## 2023-01-28 ENCOUNTER — Ambulatory Visit: Payer: Medicare Other | Admitting: Physical Therapy

## 2023-01-28 ENCOUNTER — Telehealth: Payer: Self-pay | Admitting: Physical Therapy

## 2023-01-28 ENCOUNTER — Other Ambulatory Visit: Payer: Self-pay | Admitting: Neurology

## 2023-01-28 ENCOUNTER — Ambulatory Visit: Payer: Medicare Other | Admitting: Occupational Therapy

## 2023-01-28 DIAGNOSIS — M6281 Muscle weakness (generalized): Secondary | ICD-10-CM

## 2023-01-28 DIAGNOSIS — R293 Abnormal posture: Secondary | ICD-10-CM

## 2023-01-28 DIAGNOSIS — R29818 Other symptoms and signs involving the nervous system: Secondary | ICD-10-CM

## 2023-01-28 DIAGNOSIS — R278 Other lack of coordination: Secondary | ICD-10-CM

## 2023-01-28 DIAGNOSIS — R41842 Visuospatial deficit: Secondary | ICD-10-CM

## 2023-01-28 DIAGNOSIS — R4189 Other symptoms and signs involving cognitive functions and awareness: Secondary | ICD-10-CM

## 2023-01-28 DIAGNOSIS — R498 Other voice and resonance disorders: Secondary | ICD-10-CM

## 2023-01-28 DIAGNOSIS — Z9181 History of falling: Secondary | ICD-10-CM

## 2023-01-28 DIAGNOSIS — R41840 Attention and concentration deficit: Secondary | ICD-10-CM

## 2023-01-28 DIAGNOSIS — R2681 Unsteadiness on feet: Secondary | ICD-10-CM | POA: Diagnosis not present

## 2023-01-28 NOTE — Telephone Encounter (Signed)
Order completed on Epic. Thank you for taking care of Edward Briggs.

## 2023-01-28 NOTE — Telephone Encounter (Signed)
[name], Encarnacion Slates has been seen by PT and OT at [facility]. 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   If you agree, please place an order in [facility] workque in [facility] or fax the order to [phone]. Thank you, Janann August, PT, DPT 01/28/23 9:33 AM    [facility] [address] [place], [place]  [address] Phone:  [phone] Fax:  [phone]

## 2023-01-28 NOTE — Therapy (Signed)
OUTPATIENT PHYSICAL THERAPY NEURO TREATMENT/RE-CERT   Patient Name: Edward Briggs MRN: [id number] DOB:07-14-1951, 72 y.o., male Today's Date: 01/28/2023   PCP: [name], MD  REFERRING PROVIDER:  [name], MD    END OF SESSION:  PT End of Session - 01/28/23 1410     Visit Number 17    Number of Visits 21    Date for PT Re-Evaluation [id number]   per re-cert   Authorization Type [employer] Medicare    Progress Note Due on Visit 9   PN done on visit 9   PT Start Time 1408   PT running late   PT Stop Time 1448    PT Time Calculation (min) 40 min    Equipment Utilized During Treatment Gait belt    Activity Tolerance Patient tolerated treatment well    Behavior During Therapy WFL for tasks assessed/performed                   Past Medical History:  Diagnosis Date   Diabetes mellitus without complication ([place])    GERD (gastroesophageal reflux disease)    no meds   Hypertension    controlled on meds   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   Past Surgical History:  Procedure Laterality Date   ABSCESS DRAINAGE     COLONOSCOPY     ~ 10 yrs ago   [place]  03/26/2012   Procedure: HARDWARE REMOVAL;  Surgeon: [name], MD;  Location: [place];  Service: Orthopedics;  Laterality: Left;  hardware removal deep left ankle syndesmotic screws only and stress xrays ankle    ORIF ANKLE FRACTURE  12/03/2011   Procedure: OPEN REDUCTION INTERNAL FIXATION (ORIF) ANKLE FRACTURE;  Surgeon: [name], MD;  Location: [place];  Service: Orthopedics;  Laterality: Left;  left medial malleolus fracture   PILONIDAL CYST EXCISION  ~1982   SKIN GRAFT  ~1970   Lt great toe (graft from Lt thigh)   Patient Active Problem List   Diagnosis Date Noted   HTN (hypertension) 123456   Acute metabolic encephalopathy 123456   AKI (acute kidney injury) ([place]) 07/22/2019   Sepsis ([place]) 07/22/2019   UTI (urinary tract  infection) 07/22/2019   Diabetes mellitus without complication ([place]) 123XX123   CHEST PAIN 07/21/2009    ONSET DATE: 12/01/2022   REFERRING DIAG: G20.A1 (ICD-10-CM) - Parkinson's disease without dyskinesia, unspecified whether manifestations fluctuate   THERAPY DIAG:  Other symptoms and signs involving the nervous system  Muscle weakness (generalized)  Other lack of coordination  Abnormal posture  Rationale for Evaluation and Treatment: Rehabilitation  SUBJECTIVE:  SUBJECTIVE STATEMENT: Reports still having some difficulties with his balance - like moving from one point to another. No falls.   Pt accompanied by:  Spouse, [name]    PERTINENT HISTORY: PMH: HTN, Diabetes, HLD   Pt saw neurologist [name] on 12/01/22 for memory impairment, per 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 Pt started on Sinemet   PAIN:  Are you having pain? Yes: NPRS scale: 6-7/10 Pain location: Low back pain   PRECAUTIONS: Fall  WEIGHT BEARING RESTRICTIONS: No  FALLS: Has patient fallen in last 6 months? Yes. Number of falls 2  LIVING ENVIRONMENT: Lives with: lives with their spouse Lives in: House/apartment Stairs: Yes: Internal: 10 steps; on right going up and External: 4 steps; on right going up Has following equipment at home: None  PLOF: Independent  PATIENT GOALS: Wants to get back to normal.   OBJECTIVE:   DIAGNOSTIC FINDINGS: last brain imaging was a CT of the head done in September 2020 and showed some cerebral atrophy some small vessel white matter disease but no acute intracranial pathology.   COGNITION: Overall cognitive status:  Reports some memory changes.     SENSATION: WFL  COORDINATION: Bradykinesia RLE>LLE    MUSCLE TONE: RLE: Rigidity   POSTURE: rounded shoulders, forward head, posterior pelvic tilt, and flexed trunk    LOWER EXTREMITY MMT:    MMT Right Eval Left Eval  Hip flexion 4+ 4  Hip extension    Hip abduction 5 5  Hip adduction 4+ 4+  Hip internal rotation    Hip external rotation    Knee flexion 5 5  Knee extension 4+ 4+  Ankle dorsiflexion 4+ 4+  Ankle plantarflexion    Ankle inversion    Ankle eversion    (Blank rows = not tested)  All tested in sitting.   TODAY'S TREATMENT:    GAIT Gait pattern: step through pattern, decreased arm swing- Right, decreased arm swing- Left, decreased stride length, decreased hip/knee flexion- Right, decreased hip/knee flexion- Left, decreased ankle dorsiflexion- Right, decreased ankle dorsiflexion- Left, shuffling, festinating, decreased trunk rotation, and poor foot clearance- Right Distance walked: Clinic distances  Assistive device utilized: Walker - 4 wheeled Level of assistance: SBA  Comments:With no AD, cued for posture, step length and arm swing during session.  NMR: With multi-directional stepping sheet, following direction sheet, pt unable to read directions and perform stepping, PT needing to read out instructions to pt, cues for larger step length, performed 2 sets, pt will continue to need practice with this   Therapeutic Activity: 360 degree turn to R: 3.94 seconds, turn to L: 3.78 seconds    [facility] PT Assessment - 01/28/23 1415       Mini-BESTest   Sit To Stand Normal: Comes to stand without use of hands and stabilizes independently.    Rise to Toes Moderate: Heels up, but not full range (smaller than when holding hands), OR noticeable instability for 3 s.    Stand on one leg (left) Moderate: < 20 s   1-2 seconds   Stand on one leg (right) Moderate: < 20 s   2 seconds   Stand on one leg - lowest score 1    Compensatory Stepping Correction - Forward No  step, OR would fall if not caught, OR falls spontaneously.   unable to take a step   Compensatory Stepping Correction - Backward Moderate: More than  one step is required to recover equilibrium   very delayed 2 small steps   Compensatory Stepping Correction - Left Lateral Severe: Falls, or cannot step   no step, unable to facilitate   Compensatory Stepping Correction - Right Lateral Severe:  Falls, or cannot step   no step, unable to facilitate   Stepping Corredtion Lateral - lowest score 0    Stance - Feet together, eyes open, firm surface  Normal: 30s    Stance - Feet together, eyes closed, foam surface  Moderate: < 30s   5 seconds   Incline - Eyes Closed Normal: Stands independently 30s and aligns with gravity    Change in Gait Speed Normal: Significantly changes walkling speed without imbalance    Walk with head turns - Horizontal Normal: performs head turns with no change in gait speed and good balance    Walk with pivot turns Moderate:Turns with feet close SLOW (>4 steps) with good balance.    Step over obstacles Normal: Able to step over box with minimal change of gait speed and with good balance.    Timed UP & GO with Dual Task Moderate: Dual Task affects either counting OR walking (>10%) when compared to the TUG without Dual Task.    Mini-BEST total score 18      Timed Up and Go Test   Normal TUG (seconds) 14.4    Cognitive TUG (seconds) 19.6   counts backwards from 100s by 1s (unable to do 3s), pt initially counts backwards and then counts forwards             PATIENT EDUCATION: Education details: Results of miniBEST, will perform re-cert for a few more visits to finalize HEP and work on turning  Person educated: Patient and Spouse Education method: Explanation, Media planner, and Verbal cues Education comprehension: verbalized understanding, tactile cues required, and needs further education  HOME EXERCISE PROGRAM: Access Code: [email or web address] URL:  [email or web address] Date: 12/08/2022 Prepared by: Mickie Bail Plaster  Exercises - Sit to Stand Without Arm Support  - 1 x daily - 7 x weekly - 3 sets - 10 reps - Standing Reach to Opposite Side with Weight Shift  - 1 x daily - 7 x weekly - 3 sets - 10 reps - Seated Windmill Trunk Rotation Stretch  - 1 x daily - 7 x weekly - 3 sets - 10 reps - Step sideways with arms reaching at counter   - 1 x daily - 7 x weekly - 3 sets - 10 reps - Sidelying Thoracic Rotation with Open Book  - 1 x daily - 7 x weekly - 3 sets - 10 reps - Cervical Retraction at Wall  - 1 x daily - 7 x weekly - 3-5 reps - 30 second hold - Sit to stand with band pull-apart   - 1 x daily - 7 x weekly - 3 sets - 10 reps - Lower Trunk Rotation Stretch  - 1 x daily - 7 x weekly - 4-5 reps - 20-30 second hold  GOALS: Goals reviewed with patient? Yes  SHORT TERM GOALS: Target date: 01/01/2023  Pt will be independent with PD specific initial HEP in order to build upon functional gains made in therapy. Baseline: pt reports performing consistently at home.  Goal status: MET  2.  Pt will improve MiniBest to 15/28 for decreased fall risk and improvement with compensatory stepping strategies.   Baseline: 12/28 on 2/5; 15/28 on 12/31/22 Goal status: MET  3.  Pt will verbalize understanding  of fall prevention in the home.  Baseline: provided handout on 01/12/23 Goal status: MET  4.  Pt will verbalize/demonstrate understanding of freezing techniques Baseline: reviewed and provided handout on 12/29/22 Goal status: MET  5.  Pt will improve manual TUG time to 21 seconds or less in order to demo decrease fall risk. Baseline: 27 seconds, 13.4 seconds on 12/29/22 Goal status: MET  6.  Pt will improve 5x sit<>stand to less than or equal to 17.5 sec to demonstrate improved functional strength and transfer efficiency.  Baseline: 20.28 seconds without UE support,  11.6 seconds without UE support on 12/29/22 Goal status:  MET   LONG TERM GOALS: Target date: 01/29/2023  Pt will be independent with PD specific final HEP in order to build upon functional gains made in therapy. Baseline:  Goal status: ON-GOING   2.  Pt will improve MiniBest to 18/28 for decreased fall risk and improvement with compensatory stepping strategies.   Baseline: 12/28;  18/28 on 01/28/23 Goal status: MET  3.  Pt will improve 5x sit<>stand to less than or equal to 15 sec to demonstrate improved functional strength and transfer efficiency.  Baseline: 20.28 seconds without UE support; 11.6 seconds without UE support on 12/29/22 Goal status: MET  4.  Pt will improve gait speed with no AD to at least 3.3 ft/sec in order to demo improved community mobility.  Baseline: 11.07 seconds = 2.96 ft/sec  10 seconds with no AD = 3.28 ft/sec on 01/26/23 Goal status: MET  5.   Pt will improve manual TUG time to 17 seconds or less with supervision in order to demo decrease fall risk. Baseline: 27 seconds,15 seconds on 01/26/23 Goal status: MET  6.  Pt will verbalize understanding of community resources for PD.  Baseline: pt has looked over it, but has not had the chance to do any exercises  Goal status: MET  UPDATED/ON-GOING LTGS  LONG TERM GOALS: Target date: 02/25/2023  Pt will be independent with PD specific final HEP in order to build upon functional gains made in therapy. Baseline:  Goal status: ON-GOING   2.  Pt will perform stepping strategies in the anterior and posterior direction in 1-2 steps in order to demo improved reactive baseline.  Baseline: unable to initiate forward, 2 delayed steps backwards  Goal status: INITIAL   ASSESSMENT:  CLINICAL IMPRESSION: Today's skilled session focused on assessing remainder of LTGs. Pt met LTG #2 in regards to miniBEST, pt improved to an 18/28 (at eval was 12/28). Pt continues to be challenged with SLS, stepping strategies, dual tasking, anterior weight shifting, and balance with EC. Pt  improved score, but is still at an incr risk for falls. Pt has met all other LTGs. Pt is not yet ready to D/C, but pt will benefit from a few more skilled PT sessions to work on balance/turns to [place] fall risk and finalize HEP. LTGs updated as appropriate. Will continue per POC.   OBJECTIVE IMPAIRMENTS: Abnormal gait, decreased activity tolerance, decreased balance, decreased coordination, decreased mobility, difficulty walking, decreased strength, impaired flexibility, impaired tone, and postural dysfunction.   ACTIVITY LIMITATIONS: bending, stairs, transfers, dressing, and locomotion level  PARTICIPATION LIMITATIONS: driving and community activity  PERSONAL FACTORS: Behavior pattern, Past/current experiences, Time since onset of injury/illness/exacerbation, and 3+ comorbidities: HTN, Diabetes, HLD, newly diagnosed PD  are also affecting patient's functional outcome.   REHAB POTENTIAL: Good  CLINICAL DECISION MAKING: Evolving/moderate complexity  EVALUATION COMPLEXITY: Moderate  PLAN:  PT FREQUENCY: 1-2x/week  PT DURATION:  4 weeks  PLANNED INTERVENTIONS: Therapeutic exercises, Therapeutic activity, Neuromuscular re-education, Balance training, Gait training, Patient/Family education, Self Care, Stair training, Vestibular training, DME instructions, and Re-evaluation  PLAN FOR NEXT SESSION: turning strategies, stepping strategies.   Arliss Journey, PT, DPT  01/28/2023, 4:38 PM

## 2023-01-28 NOTE — Therapy (Addendum)
OUTPATIENT OCCUPATIONAL THERAPY PARKINSON'S PROGRESS NOTE  Patient Name: Edward Briggs MRN: [id number] DOB:10-Oct-1951, 72 y.o., male Today's Date: 01/28/2023  PCP: [name], MD  REFERRING PROVIDER: [name], MD   END OF SESSION:  OT End of Session - 01/28/23 1246     Visit Number 10    Number of Visits 25    Date for OT Re-Evaluation 03/06/23    Authorization Type [employer] Medicare    Progress Note Due on Visit 10    OT Start Time 1243    OT Stop Time 1322    OT Time Calculation (min) 39 min    Activity Tolerance Patient tolerated treatment well    Behavior During Therapy WFL for tasks assessed/performed             Past Medical History:  Diagnosis Date   Diabetes mellitus without complication ([place])    GERD (gastroesophageal reflux disease)    no meds   Hypertension    controlled on meds   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   Past Surgical History:  Procedure Laterality Date   ABSCESS DRAINAGE     COLONOSCOPY     ~ 10 yrs ago   [place] B[place]  03/26/2012   Procedure: HARDWARE REMOVAL;  Surgeon: [name], MD;  Location: [facility];  Service: Orthopedics;  Laterality: Left;  hardware removal deep left ankle syndesmotic screws only and stress xrays ankle    ORIF ANKLE FRACTURE  12/03/2011   Procedure: OPEN REDUCTION INTERNAL FIXATION (ORIF) ANKLE FRACTURE;  Surgeon: [name], MD;  Location: [place];  Service: Orthopedics;  Laterality: Left;  left medial malleolus fracture   PILONIDAL CYST EXCISION  ~1982   SKIN GRAFT  ~1970   Lt great toe (graft from Lt thigh)   Patient Active Problem List   Diagnosis Date Noted   HTN (hypertension) 123456   Acute metabolic encephalopathy 123456   AKI (acute kidney injury) ([place]) 07/22/2019   Sepsis ([place]) 07/22/2019   UTI (urinary tract infection) 07/22/2019   Diabetes mellitus without complication ([place]) 123XX123   CHEST PAIN 07/21/2009     ONSET DATE: 12/01/2022  REFERRING DIAG: G20.A1 (ICD-10-CM) - Parkinson's disease 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   Pt accompanied by: significant other - [name]  PERTINENT HISTORY:   Per PT eval, " PMH: HTN, Diabetes, HLD    Pt saw neurologist [name] on 12/01/22 for memory impairment, per 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 Pt started on Sinemet.  Noticed a difference in his walking about 4-6 weeks ago. Was started on Sinemet and started taking it (1/31). Has been taking small, shuffled steps. Notices that he will lean forward at times. Getting up from a chair is getting more difficult. Has had 2 falls - one he was getting up and his lost balance, 2nd fall was down the stairs and missed a step."    PRECAUTIONS: Fall  WEIGHT BEARING RESTRICTIONS: No  PAIN:  Are you having pain? Yes: NPRS scale: 7/10 Pain location: low back Pain description: achey Aggravating factors: exercises Relieving factors: rest, medication  FALLS: Has patient fallen in last 6 months? Yes. Number of falls 3  LIVING ENVIRONMENT: Lives with: lives with their spouse Lives in: House/apartment Stairs: Yes: Internal: 10 steps; on right going up and External: 4 steps; on right going up Has following equipment at home: None   PLOF: Independent   PATIENT GOALS: Wants to get back to normal.   OBJECTIVE:   HAND DOMINANCE: Right  ADLs: Transfers/ambulation related to  ADLs: Eating: mod I Grooming: I UB Dressing: min A LB Dressing: mod A Toileting: min A with buttoning pants Bathing: Distance supervision  Tub Shower transfers: distance supervision Equipment: Careers information officer [place]  IADLs: Shopping: same as PLOF Light housekeeping: mod I Meal Prep: light meal prep, has only been cooking breakfast Community mobility: driving, short distance, no highway Medication management: min A Financial management: I Handwriting: 25% legible  MOBILITY STATUS: Needs Assist: SBA  ACTIVITY TOLERANCE: Activity tolerance: good  FUNCTIONAL OUTCOME MEASURES: Fastening/unfastening 3 buttons: 1:55  Physical performance test: PPT#2 (simulated eating) 27 seconds R = 1 & PPT#4 (donning/doffing jacket): 27 seconds = 1  COORDINATION: 9 Hole Peg test: Right: 55 sec; Left: 42 sec Box and Blocks:  Right 26 blocks, Left 31blocks  Grip strength: 73.4 lbs R ; 74.5 lbs L  UE ROM:  WFL  UE MMT:   WFL  SENSATION: WFL  MUSCLE TONE: RUE: Moderate more prominent in shoulder and LUE: Mild more prominent in elbow  COGNITION: Overall cognitive status: Impaired - impaired motor planning, processing,  OBSERVATIONS: Bradykinesia and Pill rolling L hand;  Difficulty following commands  TODAY'S TREATMENT:         OT reviewed bag exercises as noted in pt instructions  PATIENT EDUCATION: Education details: see above Person educated: Patient and Spouse Education method: Customer service manager, verbal cues, demonstration, handout Education comprehension: verbalized understanding, returned demonstration, and needs further education  HOME EXERCISE PROGRAM: 12/22/22 coordination HEP 3/13: PWR UP and TWIST in sitting and standing 01/21/23: reviewed and issued handwriting strategies (from PD website)  3/25: bag HEP  GOALS:  SHORT TERM GOALS: Target date: 01/05/2023    Pt will be independent with PD specific HEP.  Baseline: not yet initiated Goal status: IN  PROGRESS  2.  Pt will verbalize understanding of adapted strategies to maximize safety and independence with ADLs/IADLs.  Baseline: not yet initiated Goal status: IN PROGRESS  3.  Pt will write a sentence with no significant decrease in size and maintain 75% legibility.  Baseline: 25% legibility Goal status: IN PROGRESS  4.  Pt will demonstrate improved ease with fastening buttons as evidenced by decreasing 3 button/unbutton time by 5 seconds  Baseline: 1 minute 55 seconds Goal status: IN PROGRESS    LONG TERM GOALS: Target date: 03/06/2023    Pt will verbalize understanding of ways to prevent future PD related complications and PD community resources.  Baseline: not yet initiated Goal status: INITIAL  2.  Pt will write a short paragraph with no significant decrease in size and maintain 100% legibility.  Baseline: 25% legibility Goal status: INITIAL  3.  Pt will verbalize understanding of ways to keep thinking skills sharp and ways to compensate for STM changes in the future.  Baseline: not yet initiated Goal status: INITIAL  4.  Pt will demonstrate improved ease with feeding as evidenced by decreasing PPT#2 by 3 secs.  Baseline:  27 secs Goal status: INITIAL  5.  Pt will demonstrate increased ease with dressing as evidenced by decreasing PPT#4 (don/ doff jacket) to 24 secs or less.  Baseline: 27 secs Goal status: INITIAL  6.  Pt will demonstrate improved fine motor coordination for ADLs as evidenced by decreasing 9 hole peg test score for each hand by 5 secs  Baseline: Right: 55 sec; Left: 42 sec  Goal status: INITIAL   7.  Pt will demonstrate improved ease with fastening buttons as evidenced by decreasing 3 button/unbutton time by 10 seconds  Baseline: 1 minute 55 seconds seconds Goal status: INITIAL  8.  Pt will be able to place at least 29 blocks using R hand with completion of Box and Blocks test.  Baseline: 26 blocks Goal status: INITIAL     ASSESSMENT:  CLINICAL IMPRESSION: Pt is progressing towards goals. Pt/ wife verbalize understanding, however pt may benefit from reinforcement. Decreased carryover d/t cognitive deficits. Remains good candidate for skilled OT services to improve independence and safety with ADLs and IADLs.  PERFORMANCE DEFICITS: in functional skills including ADLs, IADLs, coordination, tone, Fine motor control, Gross motor control, mobility, balance, body mechanics, and UE functional use, cognitive skills including attention, perception, problem solving, sequencing, and understand  IMPAIRMENTS: are limiting patient from ADLs, IADLs, and leisure.   COMORBIDITIES:  has co-morbidities such as HTN, HLD, DM  that affects occupational performance. Patient will benefit from skilled OT to address above impairments and improve overall function.  OT OCCUPATIONAL PROFILE AND HISTORY: Detailed assessment: Review of records and additional review of physical, cognitive, psychosocial history related to current functional performance.  REHAB POTENTIAL: Fair given PD diagnosis   PLAN:  OT FREQUENCY: 2x/week  OT DURATION: 12 weeks  PLANNED INTERVENTIONS: self care/ADL training, therapeutic exercise, therapeutic activity, neuromuscular re-education, manual therapy, functional mobility training, ultrasound, paraffin, fluidotherapy, moist heat, patient/family education, cognitive remediation/compensation, visual/perceptual remediation/compensation, energy conservation, coping strategies training, DME and/or AE instructions, and Re-evaluation  RECOMMENDED OTHER SERVICES: none at this time  CONSULTED AND AGREED WITH PLAN OF CARE: Patient and family member/caregiver  PLAN FOR NEXT SESSION: review bag ex HEP (diagonal behind back and under foot)  Dennis Bast, OT 01/28/2023, 1:29 PM

## 2023-02-02 ENCOUNTER — Ambulatory Visit: Payer: Medicare Other | Admitting: Physical Therapy

## 2023-02-04 ENCOUNTER — Encounter: Payer: Self-pay | Admitting: Physical Therapy

## 2023-02-04 ENCOUNTER — Encounter: Payer: Self-pay | Admitting: Occupational Therapy

## 2023-02-04 ENCOUNTER — Ambulatory Visit: Payer: Medicare Other | Admitting: Speech Pathology

## 2023-02-04 ENCOUNTER — Ambulatory Visit: Payer: Medicare Other | Admitting: Physical Therapy

## 2023-02-04 ENCOUNTER — Ambulatory Visit: Payer: Medicare Other | Attending: Neurology | Admitting: Occupational Therapy

## 2023-02-04 DIAGNOSIS — R278 Other lack of coordination: Secondary | ICD-10-CM

## 2023-02-04 DIAGNOSIS — R471 Dysarthria and anarthria: Secondary | ICD-10-CM

## 2023-02-04 DIAGNOSIS — R29818 Other symptoms and signs involving the nervous system: Secondary | ICD-10-CM

## 2023-02-04 DIAGNOSIS — R293 Abnormal posture: Secondary | ICD-10-CM

## 2023-02-04 DIAGNOSIS — M6281 Muscle weakness (generalized): Secondary | ICD-10-CM

## 2023-02-04 DIAGNOSIS — R41842 Visuospatial deficit: Secondary | ICD-10-CM

## 2023-02-04 DIAGNOSIS — R2681 Unsteadiness on feet: Secondary | ICD-10-CM | POA: Diagnosis present

## 2023-02-04 DIAGNOSIS — R41840 Attention and concentration deficit: Secondary | ICD-10-CM | POA: Diagnosis present

## 2023-02-04 NOTE — Therapy (Signed)
OUTPATIENT OCCUPATIONAL THERAPY PARKINSON'S PROGRESS NOTE  Patient Name: Edward Briggs MRN: [id number] DOB:[birth year], 72 y.o., male Today's Date: 02/04/2023  PCP: [name], MD  REFERRING PROVIDER: [name], MD   END OF SESSION:  OT End of Session - 02/04/23 0936     Visit Number 11    Number of Visits 25    Date for OT Re-Evaluation 03/06/23    Authorization Type [employer]    Progress Note Due on Visit 49    OT Start Time 0932    OT Stop Time 1015    OT Time Calculation (min) 43 min    Activity Tolerance Patient tolerated treatment well    Behavior During Therapy WFL for tasks assessed/performed             Past Medical History:  Diagnosis Date   Diabetes mellitus without complication    GERD (gastroesophageal reflux disease)    no meds   Hypertension    controlled on meds   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   Past Surgical History:  Procedure Laterality Date   ABSCESS DRAINAGE     COLONOSCOPY     ~ 10 yrs ago   [place]  03/26/2012   Procedure: HARDWARE REMOVAL;  Surgeon: [name], MD;  Location: [place];  Service: Orthopedics;  Laterality: Left;  hardware removal deep left ankle syndesmotic screws only and stress xrays ankle    ORIF ANKLE FRACTURE  12/03/2011   Procedure: OPEN REDUCTION INTERNAL FIXATION (ORIF) ANKLE FRACTURE;  Surgeon: [name], MD;  Location: [place];  Service: Orthopedics;  Laterality: Left;  left medial malleolus fracture   PILONIDAL CYST EXCISION  ~1982   SKIN GRAFT  ~1970   Lt great toe (graft from Lt thigh)   Patient Active Problem List   Diagnosis Date Noted   HTN (hypertension) 123456   Acute metabolic encephalopathy 123456   AKI (acute kidney injury) 07/22/2019   Sepsis 07/22/2019   UTI (urinary tract infection) 07/22/2019   Diabetes mellitus without complication ([place]) 123XX123   CHEST PAIN 07/21/2009    ONSET DATE:  12/01/2022  REFERRING DIAG: G20.A1 (ICD-10-CM) - Parkinson's disease without dyskinesia, unspecified whether manifestations fluctuate   THERAPY DIAG:  Other symptoms and signs involving the nervous system  Muscle weakness (generalized)  Other lack of coordination  Visuospatial deficit  Abnormal posture  Unsteadiness on feet  Attention and concentration deficit  Rationale for Evaluation and Treatment: Rehabilitation  SUBJECTIVE:   SUBJECTIVE STATEMENT:  I worked on the bag exercises last night  Pt accompanied by: significant other - [name]  PERTINENT HISTORY:   Per PT eval, " PMH: HTN, Diabetes, HLD    Pt saw neurologist [name] on 12/01/22 for memory impairment, per 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 Pt started on Sinemet.  Noticed a difference in his walking about 4-6 weeks ago. Was started on Sinemet and started taking it (1/31). Has been taking small, shuffled steps. Notices that he will lean forward at times. Getting up from a chair is getting more difficult. Has had 2 falls - one he was getting up and his lost balance, 2nd fall was down the stairs and missed a step."    PRECAUTIONS: Fall  WEIGHT BEARING RESTRICTIONS: No  PAIN:  Are you having pain? Yes: NPRS scale:  0/10 today Pain location: low back Pain description: achey Aggravating factors: exercises Relieving factors: rest, medication  FALLS: Has patient fallen in last 6 months? Yes. Number of falls 3  LIVING ENVIRONMENT: Lives with: lives with their spouse Lives in: House/apartment Stairs: Yes: Internal: 10 steps; on right going up and External: 4 steps; on right going up Has following equipment at home: None   PLOF: Independent   PATIENT GOALS: Wants to get back to normal.   OBJECTIVE:   HAND DOMINANCE: Right  ADLs: Transfers/ambulation related to ADLs: Eating: mod  I Grooming: I UB Dressing: min A LB Dressing: mod A Toileting: min A with buttoning pants Bathing: Distance supervision  Tub Shower transfers: distance supervision Equipment: Careers information officer [place]  IADLs: Shopping: same as PLOF Light housekeeping: mod I Meal Prep: light meal prep, has only been cooking breakfast Community mobility: driving, short distance, no highway Medication management: min A Financial management: I Handwriting: 25% legible  MOBILITY STATUS: Needs Assist: SBA  ACTIVITY TOLERANCE: Activity tolerance: good  FUNCTIONAL OUTCOME MEASURES: Fastening/unfastening 3 buttons: 1:55  Physical performance test: PPT#2 (simulated eating) 27 seconds R = 1 & PPT#4 (donning/doffing jacket): 27 seconds = 1  COORDINATION: 9 Hole Peg test: Right: 55 sec; Left: 42 sec Box and Blocks:  Right 26 blocks, Left 31blocks  Grip strength: 73.4 lbs R ; 74.5 lbs L  UE ROM:  WFL  UE MMT:   WFL  SENSATION: WFL  MUSCLE TONE: RUE: Moderate more prominent in shoulder and LUE: Mild more prominent in elbow  COGNITION: Overall cognitive status: Impaired - impaired motor planning, processing,  OBSERVATIONS: Bradykinesia and Pill rolling L hand;  Difficulty following commands  TODAY'S TREATMENT:         Recommended pt limit bag ex's to 4 ex's for now (d/t apraxia and cognitive deficits) - therapist reprinted and highlighted most important ex's for dressing (behind head, behind back, under feet, and hand ex). Stressed that it's more important to do 4 well w/ repetition as pt does improve w/ repetition (vs. trying to do too many incorrectly). Recommended doing bag ex's every morning prior to dressing. Pt also encouraged to continue coordination HEP and PWR! Up and Twist seated.   Practiced donning/doffing jacket x 2 w/ cues for success. 1st trail - required mod assist and max cueing. Pt did better 2nd trail w/ very little assist and min cues. Pt/wife instructed to continue to practice this  every morning, even as weather warms up for summer, so pt improves and is ready again for fall/winter.   Practiced writing name with 100% legibility in print (copied from line above). Practiced writing simple sentence w/ cues for longer words. Pt w/ micrographia only when words were longer d/t apraxia and possible mild aphasia.   Practiced hooking/unhooking buttons on table w/ cues to line up button with button hold this push through big in one big push. Then to pull button w/ Rt hand and shirt opposite direction w/ Lt hand for unbuttoning - pt reports he does better when shirt is on and therapist will practice next time with shirt on.    PATIENT EDUCATION: Education details: see above Person educated: Patient and Spouse Education method: Customer service manager, verbal cues, demonstration, handout Education comprehension: verbalized understanding, returned demonstration, and needs further education  HOME EXERCISE PROGRAM: 12/22/22 coordination HEP 3/13: PWR UP and TWIST in sitting and standing 01/21/23: reviewed and issued handwriting strategies (from PD website)  3/25: bag HEP  GOALS:  SHORT TERM GOALS:  Target date: 01/05/2023    Pt will be independent with PD specific HEP.  Baseline: not yet initiated Goal status: MET w/ cues (wife instructed in how to cue him)  2.  Pt will verbalize understanding of adapted strategies to maximize safety and independence with ADLs/IADLs.  Baseline: not yet initiated Goal status: IN PROGRESS  3.  Pt will write a sentence with no significant decrease in size and maintain 75% legibility.  Baseline: 25% legibility Goal status: IN PROGRESS - pt meeting this goal w/ strategies and calling out letters (limited by apraxia and ? Some aphasia)   4.  Pt will demonstrate improved ease with fastening buttons as evidenced by decreasing 3 button/unbutton time by 5 seconds -  Baseline: 1 minute 55 seconds Goal status: IN PROGRESS - recommend testing  with shirt on     LONG TERM GOALS: Target date: 03/06/2023    Pt will verbalize understanding of ways to prevent future PD related complications and PD community resources.  Baseline: not yet initiated Goal status: INITIAL  2.  Pt will write a short paragraph with no significant decrease in size and maintain 100% legibility.  Baseline: 25% legibility Goal status: INITIAL  3.  Pt will verbalize understanding of ways to keep thinking skills sharp and ways to compensate for STM changes in the future.  Baseline: not yet initiated Goal status: INITIAL  4.  Pt will demonstrate improved ease with feeding as evidenced by decreasing PPT#2 by 3 secs.  Baseline: 27 secs Goal status: INITIAL  5.  Pt will demonstrate increased ease with dressing as evidenced by decreasing PPT#4 (don/ doff jacket) to 24 secs or less.  Baseline: 27 secs Goal status: INITIAL  6.  Pt will demonstrate improved fine motor coordination for ADLs as evidenced by decreasing 9 hole peg test score for each hand by 5 secs  Baseline: Right: 55 sec; Left: 42 sec  Goal status: INITIAL   7.  Pt will demonstrate improved ease with fastening buttons as evidenced by decreasing 3 button/unbutton time by 10 seconds  Baseline: 1 minute 55 seconds seconds Goal status: INITIAL  8.  Pt will be able to place at least 29 blocks using R hand with completion of Box and Blocks test.  Baseline: 26 blocks Goal status: INITIAL    ASSESSMENT:  CLINICAL IMPRESSION: Pt is progressing towards goals. Pt/ wife verbalize understanding, however pt may benefit from reinforcement. Decreased carryover d/t cognitive deficits. Remains good candidate for skilled OT services to improve independence and safety with ADLs and IADLs.  PERFORMANCE DEFICITS: in functional skills including ADLs, IADLs, coordination, tone, Fine motor control, Gross motor control, mobility, balance, body mechanics, and UE functional use, cognitive skills including  attention, perception, problem solving, sequencing, and understand  IMPAIRMENTS: are limiting patient from ADLs, IADLs, and leisure.   COMORBIDITIES:  has co-morbidities such as HTN, HLD, DM  that affects occupational performance. Patient will benefit from skilled OT to address above impairments and improve overall function.  OT OCCUPATIONAL PROFILE AND HISTORY: Detailed assessment: Review of records and additional review of physical, cognitive, psychosocial history related to current functional performance.  REHAB POTENTIAL: Fair given PD diagnosis   PLAN:  OT FREQUENCY: 2x/week  OT DURATION: 12 weeks  PLANNED INTERVENTIONS: self care/ADL training, therapeutic exercise, therapeutic activity, neuromuscular re-education, manual therapy, functional mobility training, ultrasound, paraffin, fluidotherapy, moist heat, patient/family education, cognitive remediation/compensation, visual/perceptual remediation/compensation, energy conservation, coping strategies training, DME and/or AE instructions, and Re-evaluation  RECOMMENDED OTHER SERVICES: none at this  time  CONSULTED AND AGREED WITH PLAN OF CARE: Patient and family member/caregiver  PLAN FOR NEXT SESSION: practice donning button up shirt and hooking/unhooking buttons while ON, (button hook only if needed), continue ADL strategies for dressing, eating, writing, assess remaining STG's  Hans Eden, OT 02/04/2023, 9:37 AM

## 2023-02-04 NOTE — Patient Instructions (Signed)
  Parkinson's can cause slower processing - you may need extra time to process information (this can look like your forgot information when really you didn't get it in the first place)  When an conversation is important - limit background noise (TV, appliances, etc), be face to face,   Parkinson's can cause low volume, hoarse voice - pay attention to if others are asking you to repeat yourself, telling you they can't hear you or are leaning in and showing that they are working hard to understand you  Parkinson's Voice Project -  Speak Out! program    Get the persons attention before you speak  Use eye contact and face the person you are speaking to  Be in close proximity to the person you are speaking to  Turn down any noise in the environment such as the TV, walk away from loud appliances, air conditioners, fans, dish washers etc  In large gatherings, sit or stay on the side not the center of the room  Try to sit with a wall behind you or in a corner so noise isn't coming at you from all directions when dining out or attending gatherings  Repeat back what you have heard to make sure you got all of the information  Get information in writing

## 2023-02-04 NOTE — Therapy (Signed)
OUTPATIENT SPEECH LANGUAGE PATHOLOGY PARKINSON'S EVALUATION   Patient Name: Edward Briggs MRN: [id number] DOB:1951-04-01, 72 y.o., male Today's Date: 02/04/2023  PCP: [name] MD REFERRING PROVIDER: [name], MD  END OF SESSION:  End of Session - 02/04/23 1210     Visit Number 1    Number of Visits 1    Date for SLP Re-Evaluation 02/04/23    SLP Start Time 0845    SLP Stop Time  0930    SLP Time Calculation (min) 45 min    Activity Tolerance Patient tolerated treatment well             Past Medical History:  Diagnosis Date   Diabetes mellitus without complication    GERD (gastroesophageal reflux disease)    no meds   Hypertension    controlled on meds   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   Past Surgical History:  Procedure Laterality Date   ABSCESS DRAINAGE     COLONOSCOPY     ~ 10 yrs ago   [place] REMOVAL  03/26/2012   Procedure: HARDWARE REMOVAL;  Surgeon: [name], MD;  Location: [place];  Service: Orthopedics;  Laterality: Left;  hardware removal deep left ankle syndesmotic screws only and stress xrays ankle    ORIF ANKLE FRACTURE  12/03/2011   Procedure: OPEN REDUCTION INTERNAL FIXATION (ORIF) ANKLE FRACTURE;  Surgeon: [name], MD;  Location: [place];  Service: Orthopedics;  Laterality: Left;  left medial malleolus fracture   PILONIDAL CYST EXCISION  ~1982   SKIN GRAFT  ~1970   Lt great toe (graft from Lt thigh)   Patient Active Problem List   Diagnosis Date Noted   HTN (hypertension) 123456   Acute metabolic encephalopathy 123456   AKI (acute kidney injury) 07/22/2019   Sepsis 07/22/2019   UTI (urinary tract infection) 07/22/2019   Diabetes mellitus without complication ([place]) 123XX123   CHEST PAIN 07/21/2009    ONSET DATE: January 2024 PD dx  REFERRING DIAG: R49.8 (ICD-10-CM) - Hypophonia R41.89 (ICD-10-CM) - Cognitive impairment  THERAPY DIAG:  Dysarthria  and anarthria  Rationale for Evaluation and Treatment: Rehabilitation  SUBJECTIVE:   SUBJECTIVE STATEMENT: "I don't why I'm here" Pt accompanied by: significant other [name]  PERTINENT HISTORY: Re  PAIN:  Are you having pain? No  FALLS: Has patient fallen in last 6 months?  See PT evaluation for details  LIVING ENVIRONMENT: Lives with: lives with their spouse Lives in: House/apartment  PLOF:  Level of assistance: Independent with ADLs, Needed assistance with IADLS Employment: Retired  PATIENT GOALS: N/A  OBJECTIVE:   DIAGNOSTIC FINDINGS: N/A  COGNITION: Overall cognitive status: Impaired Areas of impairment: Attention and Problem solving Comments: slow processing  MOTOR SPEECH: Overall motor speech: impaired Level of impairment: Conversation Respiration: thoracic breathing Phonation: low vocal intensity Resonance: WFL Articulation: Appears intact Intelligibility: Intelligibility reduced Motor planning: Appears intact Motor speech errors: unaware Interfering components:  N/A Effective technique: increased vocal intensity  ORAL MOTOR EXAMINATION: Overall status: WFL Comments:    OBJECTIVE VOICE ASSESSMENT: Sustained "ah" maximum phonation time: 17 seconds Sustained "ah" loudness average: 81 dB with model 1x Oral reading (passage) loudness average: 66 dB Oral reading loudness range: 68-65 dB Conversational loudness average: 65 dB Conversational loudness range: 64-66 dB Voice quality: low vocal intensity Stimulability trials: Given SLP modeling and usual mod cues, loudness average increased to 68dB (range of 66 to 69) at (loud paragraph,  level.  Comments:  Lleyton wore mask throughout evaluation - volume measured through mask  Completed audio recording of patients baseline voice without cueing from SLP: No  Pt does not report difficulty with swallowing which does not warrant further evaluation.  PATIENT REPORTED OUTCOME MEASURES (PROM): Communication  Effectiveness Survey: 28/32 - 3 on communicating over the phone, in a noisy gathering, across a room and in the car. Others 4 or very effective communication  TODAY'S TREATMENT:                                                                                                                                         DATE: Education provided re: PD effects on voice, speech, processing, swallowing - provided a copy of Communicative Effectiveness Survey to help monitor any changes in communication/voice. Instructed them to plan on attending speech therapy at next screen (6 months) or sooner if needed. Provided Parkinson's Voice Project website for their research/education  PATIENT EDUCATION: Education details: See today's treatment, see patient instructions, compensations for slow processing Person educated: Patient and Spouse Education method: Explanation and Handouts Education comprehension: verbalized understanding and needs further education  HOME EXERCISE PROGRAM: N/A   GOALS: N/A  ASSESSMENT:  CLINICAL IMPRESSION: Patient is a 72 y.o. male who was seen today for dysarthria. 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 Both Derwood and [name] deny difficulty communicating. Vickii Chafe denies difficulty hearing or understanding [place]. They both report Isaih communicates successfully with church members, family and over the phone. Dwain's average volume in conversation is 64 dB, significantly below the norm of 70dB. However due to minimal to no difficulty communicating in the community, they elect to defer ST at this time.  I am in agreement . Slow processing evident during evaluation, however Loris is managing his meds, appointments and participating in financial management with no difficulty per pt and spouse. . I did inform them that Cordario will likely need ST in 6-12 months, or when they return for 6 month  PD screen. I provided information on speech, swallowing and cognitive  changes that can occur with PD. No ST f/u at this time.  OBJECTIVE IMPAIRMENTS: Objective impairments include executive functioning, dysarthria, and slow processing . These impairments are limiting patient from effectively communicating at home and in community.Factors affecting potential to achieve goals and functional outcome are  medical prognosis .[name] Patient will benefit from skilled SLP services to address above impairments and improve overall function.  REHAB POTENTIAL: Good  PLAN:  SLP FREQUENCY: N/A  SLP DURATION: N/A  PLANNED INTERVENTIONS: N/A    Jozelyn Kuwahara, Annye Rusk, CCC-SLP 02/04/2023, 12:14 PM

## 2023-02-04 NOTE — Therapy (Signed)
OUTPATIENT PHYSICAL THERAPY NEURO TREATMENT   Patient Name: Edward Briggs MRN: [id number] DOB:[birth year], 72 y.o., male Today's Date: 02/04/2023   PCP: [name], MD  REFERRING PROVIDER:  [name], MD    END OF SESSION:  PT End of Session - 02/04/23 1021     Visit Number 18    Number of Visits 21    Date for PT Re-Evaluation [id number]   per re-cert   Authorization Type [employer]    Progress Note Due on Visit 9   PN done on visit 9   PT Start Time 1019    PT Stop Time 1100    PT Time Calculation (min) 41 min    Equipment Utilized During Treatment Gait belt    Activity Tolerance Patient tolerated treatment well    Behavior During Therapy WFL for tasks assessed/performed                   Past Medical History:  Diagnosis Date   Diabetes mellitus without complication    GERD (gastroesophageal reflux disease)    no meds   Hypertension    controlled on meds   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   Past Surgical History:  Procedure Laterality Date   ABSCESS DRAINAGE     COLONOSCOPY     ~ 10 yrs ago   [place]  03/26/2012   Procedure: HARDWARE REMOVAL;  Surgeon: [name], MD;  Location: [facility];  Service: Orthopedics;  Laterality: Left;  hardware removal deep left ankle syndesmotic screws only and stress xrays ankle    ORIF ANKLE FRACTURE  12/03/2011   Procedure: OPEN REDUCTION INTERNAL FIXATION (ORIF) ANKLE FRACTURE;  Surgeon: [name], MD;  Location: [facility];  Service: Orthopedics;  Laterality: Left;  left medial malleolus fracture   PILONIDAL CYST EXCISION  ~1982   SKIN GRAFT  ~1970   Lt great toe (graft from Lt thigh)   Patient Active Problem List   Diagnosis Date Noted   HTN (hypertension) 123456   Acute metabolic encephalopathy 123456   AKI (acute kidney injury) 07/22/2019   Sepsis 07/22/2019   UTI (urinary tract infection) 07/22/2019   Diabetes mellitus  without complication ([place]) 123XX123   CHEST PAIN 07/21/2009    ONSET DATE: 12/01/2022   REFERRING DIAG: G20.A1 (ICD-10-CM) - Parkinson's disease without dyskinesia, unspecified whether manifestations fluctuate   THERAPY DIAG:  Other symptoms and signs involving the nervous system  Muscle weakness (generalized)  Abnormal posture  Unsteadiness on feet  Rationale for Evaluation and Treatment: Rehabilitation  SUBJECTIVE:  SUBJECTIVE STATEMENT: No falls. Wants to work on some strength exercises.   Pt accompanied by:  Spouse, [name]    PERTINENT HISTORY: PMH: HTN, Diabetes, HLD   Pt saw neurologist [name] on 12/01/22 for memory impairment, per 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 Pt started on Sinemet   PAIN:  Are you having pain? None.   PRECAUTIONS: Fall  WEIGHT BEARING RESTRICTIONS: No  FALLS: Has patient fallen in last 6 months? Yes. Number of falls 2  LIVING ENVIRONMENT: Lives with: lives with their spouse Lives in: House/apartment Stairs: Yes: Internal: 10 steps; on right going up and External: 4 steps; on right going up Has following equipment at home: None  PLOF: Independent  PATIENT GOALS: Wants to get back to normal.   OBJECTIVE:   DIAGNOSTIC FINDINGS: last brain imaging was a CT of the head done in September 2020 and showed some cerebral atrophy some small vessel white matter disease but no acute intracranial pathology.   COGNITION: Overall cognitive status:  Reports some memory changes.    SENSATION: WFL  COORDINATION: Bradykinesia RLE>LLE    MUSCLE TONE: RLE: Rigidity   POSTURE: rounded shoulders, forward head, posterior pelvic tilt, and  flexed trunk    LOWER EXTREMITY MMT:    MMT Right Eval Left Eval  Hip flexion 4+ 4  Hip extension    Hip abduction 5 5  Hip adduction 4+ 4+  Hip internal rotation    Hip external rotation    Knee flexion 5 5  Knee extension 4+ 4+  Ankle dorsiflexion 4+ 4+  Ankle plantarflexion    Ankle inversion    Ankle eversion    (Blank rows = not tested)  All tested in sitting.   TODAY'S TREATMENT:    GAIT Gait pattern: step through pattern, decreased arm swing- Right, decreased arm swing- Left, decreased stride length, decreased hip/knee flexion- Right, decreased hip/knee flexion- Left, decreased ankle dorsiflexion- Right, decreased ankle dorsiflexion- Left, shuffling, festinating, decreased trunk rotation, and poor foot clearance- Right Distance walked: Clinic distances  Assistive device utilized: Walker - 4 wheeled Level of assistance: SBA  Comments:With no AD, cued for posture, step length and arm swing during session.  Pt with one instance of almost tripping over his R foot, but pt was able to maintain balance.    Ther Ex: SciFit multi-peaks level 4.5 for 8 minutes using BUE/BLEs for neural priming for reciprocal movement, dynamic cardiovascular warmup and increased amplitude of stepping. Pt able to keep spm >90.  RPE of 7-8/10   Alternating forward step ups with floating contralateral leg- working on foot clearance, functional strengthening, coordination, and SLS, beginning with UE support > trying to perform with none. Performed 15 reps each side, due to apraxia, pt needing cues throughout for sequencing and technique and looking straight ahead for posture. Despite cues throughout to try to keep contralateral leg off step, pt continued to bring it to the step first and then trying to lift it. Pt more challenged with standing on RLE and pt fatigued after activity.  Lateral step ups with RLE on step, and trying to float LLE. Pt needing UE support and cued for posture, pt with  difficulty with standing tall with knee extension through RLE and sometimes not able to lift LLE up to step. Pt requesting to stop after 8 reps due to pain in his back and needing a seated rest break  NMR: With 2nd PT giving resistance using resisted belt and PT providing min guard/min A for balance: Had 4 colorful floor dots on floor for visual cue for forward and diagonal steps. PT called out which dot pt would step to (cues for larger step length and weight shifting and picking foot up to step back to midline). Pt more challenged when having to step to the L side. Progressed to 2 step commands for cognitive challenge, pt frequently performing with the incorrect foot or going to the wrong color. Performed approx. 15 reps of stepping  Gave resistance in lateral direction with pt having to take big lateral side step to target with resistance, performed 10 reps each direction. Cued for posture, pt more challenged taking a larger step with RLE      PATIENT EDUCATION: Education details: Continue HEP  Person educated: Patient and Spouse Education method: Explanation, Demonstration, and Verbal cues Education comprehension: verbalized understanding, tactile cues required, and needs further education  HOME EXERCISE PROGRAM: Access Code: [email or web address] URL: [email or web address] Date: 12/08/2022 Prepared by: Mickie Bail Plaster  Exercises - Sit to Stand Without Arm Support  - 1 x daily - 7 x weekly - 3 sets - 10 reps - Standing Reach to Opposite Side with Weight Shift  - 1 x daily - 7 x weekly - 3 sets - 10 reps - Seated Windmill Trunk Rotation Stretch  - 1 x daily - 7 x weekly - 3 sets - 10 reps - Step sideways with arms reaching at counter   - 1 x daily - 7 x weekly - 3 sets - 10 reps - Sidelying Thoracic Rotation with Open Book  - 1 x daily - 7 x weekly - 3 sets - 10 reps - Cervical Retraction at Wall  - 1 x daily - 7 x weekly - 3-5 reps - 30 second hold - Sit to stand with band  pull-apart   - 1 x daily - 7 x weekly - 3 sets - 10 reps - Lower Trunk Rotation Stretch  - 1 x daily - 7 x weekly - 4-5 reps - 20-30 second hold  GOALS: Goals reviewed with patient? Yes   LONG TERM GOALS: Target date: 01/29/2023  Pt will be independent with PD specific final HEP in order to build upon functional gains made in therapy. Baseline:  Goal status: ON-GOING   2.  Pt will improve MiniBest to 18/28 for decreased fall risk and improvement with compensatory stepping strategies.   Baseline: 12/28;  18/28 on 01/28/23 Goal status: MET  3.  Pt will improve 5x sit<>stand to less than or equal to 15 sec to demonstrate improved functional strength and transfer efficiency.  Baseline: 20.28 seconds without UE support; 11.6 seconds without UE support on 12/29/22 Goal status: MET  4.  Pt will improve gait speed with no AD to at least 3.3 ft/sec in order to demo improved community mobility.  Baseline: 11.07 seconds = 2.96 ft/sec  10 seconds with no AD = 3.28 ft/sec on 01/26/23 Goal status: MET  5.   Pt will improve manual TUG time to 17 seconds or less with supervision in order to demo decrease fall risk. Baseline: 27 seconds,15 seconds on 01/26/23 Goal status: MET  6.  Pt will verbalize understanding of community resources for PD.  Baseline: pt has looked over it, but has not had the chance to do any exercises  Goal status: MET  UPDATED/ON-GOING LTGS  LONG TERM GOALS: Target date: 02/25/2023  Pt will be independent  with PD specific final HEP in order to build upon functional gains made in therapy. Baseline:  Goal status: ON-GOING   2.  Pt will perform stepping strategies in the anterior and posterior direction in 1-2 steps in order to demo improved reactive baseline.  Baseline: unable to initiate forward, 2 delayed steps backwards  Goal status: INITIAL   ASSESSMENT:  CLINICAL IMPRESSION: Today's skilled session focused on reciprocal movement patterns with SciFit, stepping  strategies, and step ups for coordination/functional strengthening. Due to apraxia, pt challenged with step ups and needing verbal/demo cues throughout, with pt still unable to properly complete exercise. With resisted stepping, pt more challenged in the forward/diagonal direction, needing min A at times to regain balance. Will continue per POC.   OBJECTIVE IMPAIRMENTS: Abnormal gait, decreased activity tolerance, decreased balance, decreased coordination, decreased mobility, difficulty walking, decreased strength, impaired flexibility, impaired tone, and postural dysfunction.   ACTIVITY LIMITATIONS: bending, stairs, transfers, dressing, and locomotion level  PARTICIPATION LIMITATIONS: driving and community activity  PERSONAL FACTORS: Behavior pattern, Past/current experiences, Time since onset of injury/illness/exacerbation, and 3+ comorbidities: HTN, Diabetes, HLD, newly diagnosed PD  are also affecting patient's functional outcome.   REHAB POTENTIAL: Good  CLINICAL DECISION MAKING: Evolving/moderate complexity  EVALUATION COMPLEXITY: Moderate  PLAN:  PT FREQUENCY: 1-2x/week  PT DURATION: 4 weeks  PLANNED INTERVENTIONS: Therapeutic exercises, Therapeutic activity, Neuromuscular re-education, Balance training, Gait training, Patient/Family education, Self Care, Stair training, Vestibular training, DME instructions, and Re-evaluation  PLAN FOR NEXT SESSION: turning strategies, stepping strategies. Add forward stepping to HEP   Arliss Journey, PT, DPT  02/04/2023, 11:51 AM

## 2023-02-09 ENCOUNTER — Ambulatory Visit: Payer: Medicare Other | Admitting: Physical Therapy

## 2023-02-11 ENCOUNTER — Ambulatory Visit: Payer: Medicare Other | Admitting: Physical Therapy

## 2023-02-18 ENCOUNTER — Ambulatory Visit: Payer: Medicare Other | Admitting: Physical Therapy

## 2023-02-18 ENCOUNTER — Encounter: Payer: Self-pay | Admitting: Physical Therapy

## 2023-02-18 ENCOUNTER — Ambulatory Visit: Payer: Medicare Other | Admitting: Occupational Therapy

## 2023-02-18 DIAGNOSIS — R41842 Visuospatial deficit: Secondary | ICD-10-CM

## 2023-02-18 DIAGNOSIS — R29818 Other symptoms and signs involving the nervous system: Secondary | ICD-10-CM

## 2023-02-18 DIAGNOSIS — R278 Other lack of coordination: Secondary | ICD-10-CM

## 2023-02-18 DIAGNOSIS — R2681 Unsteadiness on feet: Secondary | ICD-10-CM

## 2023-02-18 DIAGNOSIS — M6281 Muscle weakness (generalized): Secondary | ICD-10-CM

## 2023-02-18 DIAGNOSIS — R293 Abnormal posture: Secondary | ICD-10-CM

## 2023-02-18 DIAGNOSIS — R41840 Attention and concentration deficit: Secondary | ICD-10-CM

## 2023-02-18 NOTE — Patient Instructions (Signed)
Performing Daily Activities with Big Movements  Pick at least one activity a day and perform with BIG, DELIBERATE movements/effort. This can make the activity easier and turn daily activities into exercise!  If you are standing during the activity, make sure to keep feet apart and stand with good/big/PWR! UP posture.  Examples: Dressing - Push arms in sleeves, twist when putting on jacket, push foot into pants, open hands to pull down shirt/put on socks/pull up pants Bathing - Wash/dry with long strokes Brushing your teeth - Big, slow movements Cutting food - Long deliberate cuts Opening jar/bottle - Move as much as you can with each turn Picking up a cup/bottle - Open hand up big and get object all the way in palm Hanging up clothes/getting clothes down from closet - Reach with big effort Putting away groceries/dishes - Reach with big effort Wiping counter/table - Move in big, long strokes Stirring while cooking - Exaggerate movement Sweeping - Move arms in big, long strokes Vacuuming - Push with big movement Folding clothes - Exaggerate arm movements Changing light bulb - Move as much as you can with each turn Using a screwdriver - Move as much as you can with each turn Walking into a store/restaurant - Walk with big steps, swing arms if able Standing up from a chair/recliner/sofa - Scoot forward, lean forward, and stand with big effort

## 2023-02-18 NOTE — Therapy (Signed)
OUTPATIENT PHYSICAL THERAPY NEURO TREATMENT   Patient Name: Edward Briggs MRN: [id number] DOB:[birth year], 72 y.o., male Today's Date: 02/18/2023   PCP: [name], MD  REFERRING PROVIDER:  Windell Norfolk, MD    END OF SESSION:  PT End of Session - 02/18/23 1019     Visit Number 19    Number of Visits 21    Date for PT Re-Evaluation 02/27/23   per re-cert   Authorization Type [employer] Medicare    Progress Note Due on Visit 9   PN done on visit 9   PT Start Time 1017    PT Stop Time 1058    PT Time Calculation (min) 41 min    Equipment Utilized During Treatment Gait belt    Activity Tolerance Patient tolerated treatment well    Behavior During Therapy WFL for tasks assessed/performed                    Past Medical History:  Diagnosis Date   Diabetes mellitus without complication    GERD (gastroesophageal reflux disease)    no meds   Hypertension    controlled on meds   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   Past Surgical History:  Procedure Laterality Date   ABSCESS DRAINAGE     COLONOSCOPY     ~ 10 yrs ago   FRACTURE SURGERY     HARDWARE REMOVAL  03/26/2012   Procedure: HARDWARE REMOVAL;  Surgeon: Sherri Rad, MD;  Location: [facility];  Service: Orthopedics;  Laterality: Left;  hardware removal deep left ankle syndesmotic screws only and stress xrays ankle    ORIF ANKLE FRACTURE  12/03/2011   Procedure: OPEN REDUCTION INTERNAL FIXATION (ORIF) ANKLE FRACTURE;  Surgeon: Sherri Rad, MD;  [facility];  Service: Orthopedics;  Laterality: Left;  left medial malleolus fracture   PILONIDAL CYST EXCISION  ~1982   SKIN GRAFT  ~1970   Lt great toe (graft from Lt thigh)   Patient Active Problem List   Diagnosis Date Noted   HTN (hypertension) 07/22/2019   Acute metabolic encephalopathy 07/22/2019   AKI (acute kidney injury) 07/22/2019   Sepsis 07/22/2019   UTI (urinary tract infection) 07/22/2019   Diabetes mellitus  without complication ([facility]) 09/10/2012   CHEST PAIN 07/21/2009    ONSET DATE: 12/01/2022   REFERRING DIAG: G20.A1 (ICD-10-CM) - Parkinson's disease without dyskinesia, unspecified whether manifestations fluctuate   THERAPY DIAG:  Other symptoms and signs involving the nervous system  Muscle weakness (generalized)  Abnormal posture  Unsteadiness on feet  Rationale for Evaluation and Treatment: Rehabilitation  SUBJECTIVE:  SUBJECTIVE STATEMENT: No changes, no falls. Has been doing his exercises.   Pt accompanied by:  Spouse, [name]    PERTINENT HISTORY: PMH: HTN, Diabetes, HLD   Pt saw neurologist Dr. Teresa Coombs on 12/01/22 for memory impairment, per Dr. 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 Pt started on Sinemet   PAIN:  Are you having pain? None.   PRECAUTIONS: Fall  WEIGHT BEARING RESTRICTIONS: No  FALLS: Has patient fallen in last 6 months? Yes. Number of falls 2  LIVING ENVIRONMENT: Lives with: lives with their spouse Lives in: House/apartment Stairs: Yes: Internal: 10 steps; on right going up and External: 4 steps; on right going up Has following equipment at home: None  PLOF: Independent  PATIENT GOALS: Wants to get back to normal.   OBJECTIVE:   DIAGNOSTIC FINDINGS: last brain imaging was a CT of the head done in September 2020 and showed some cerebral atrophy some small vessel white matter disease but no acute intracranial pathology.   COGNITION: Overall cognitive status:  Reports some memory changes.    SENSATION: WFL  COORDINATION: Bradykinesia RLE>LLE    MUSCLE TONE: RLE: Rigidity   POSTURE: rounded shoulders, forward head, posterior pelvic tilt, and  flexed trunk    LOWER EXTREMITY MMT:    MMT Right Eval Left Eval  Hip flexion 4+ 4  Hip extension    Hip abduction 5 5  Hip adduction 4+ 4+  Hip internal rotation    Hip external rotation    Knee flexion 5 5  Knee extension 4+ 4+  Ankle dorsiflexion 4+ 4+  Ankle plantarflexion    Ankle inversion    Ankle eversion    (Blank rows = not tested)  All tested in sitting.   TODAY'S TREATMENT:    GAIT Gait pattern: step through pattern, decreased arm swing- Right, decreased arm swing- Left, decreased stride length, decreased hip/knee flexion- Right, decreased hip/knee flexion- Left, decreased ankle dorsiflexion- Right, decreased ankle dorsiflexion- Left, shuffling, festinating, decreased trunk rotation, and poor foot clearance- Right Distance walked: Clinic distances  Assistive device utilized: Walker - 4 wheeled Level of assistance: SBA  Comments:With no AD, cued for posture, step length and arm swing during session.   Ther Ex: SciFit multi-peaks level 5.0 for 8 minutes using BUE/BLEs for neural priming for reciprocal movement, dynamic cardiovascular warmup and increased amplitude of stepping. Pt able to keep spm >90.  RPE of 7/10    NMR: With 6 blaze pods in a circle on floor, for improved turning, weight shifting, visual scanning, performing single color tap. Min guard as needed for balance. Cues for incr step length when turning and step height.  Massed practice, performed 5 bouts of 1 minute with 15 second rest break in between: 8, 10, 10, 10, 11 hits On blue air ex with one PT providing min guard/min A for balance and 2nd PT providing resistance at pt's pelvis:  Alternating forward stepping, x20 reps each side, beginning with steady resistance and then progressing to random perturbations, cues for foot clearance when stepping on and off (esp with RLE), posture, pt needing to grab onto bars intermittently for balance  Alternating posterior stepping, x20 reps each side,  beginning with steady resistance and then progressing to random perturbations, cues for big step posteriorly, pt losing his balance more posteriorly      PATIENT EDUCATION: Education details: Continue HEP, Discussion about POC - will plan to  D/C on Monday with return PD screens in 6 months, adding alternating forward stepping to HEP for improved balance recovery  Person educated: Patient and Spouse Education method: Explanation, Demonstration, and Verbal cues Education comprehension: verbalized understanding, tactile cues required, and needs further education  HOME EXERCISE PROGRAM: Access Code: [email or web address] URL: [email or web address] Date: 02/18/2023 Prepared by: Sherlie Ban  Exercises - Sit to Stand Without Arm Support  - 1 x daily - 7 x weekly - 3 sets - 10 reps - Standing Reach to Opposite Side with Weight Shift  - 1 x daily - 7 x weekly - 3 sets - 10 reps - Seated Windmill Trunk Rotation Stretch  - 1 x daily - 7 x weekly - 3 sets - 10 reps - Step sideways with arms reaching at counter   - 1 x daily - 7 x weekly - 3 sets - 10 reps - Sidelying Thoracic Rotation with Open Book  - 1 x daily - 7 x weekly - 3 sets - 10 reps - Cervical Retraction at Wall  - 1 x daily - 7 x weekly - 3-5 reps - 30 second hold - Sit to stand with band pull-apart   - 1 x daily - 7 x weekly - 3 sets - 10 reps - Lower Trunk Rotation Stretch  - 1 x daily - 7 x weekly - 4-5 reps - 20-30 second hold - Alternating Step Forward with Support  - 1-2 x daily - 5 x weekly - 1-2 sets - 10 reps  GOALS: Goals reviewed with patient? Yes   LONG TERM GOALS: Target date: 01/29/2023  Pt will be independent with PD specific final HEP in order to build upon functional gains made in therapy. Baseline:  Goal status: ON-GOING   2.  Pt will improve MiniBest to 18/28 for decreased fall risk and improvement with compensatory stepping strategies.   Baseline: 12/28;  18/28 on 01/28/23 Goal status: MET  3.  Pt  will improve 5x sit<>stand to less than or equal to 15 sec to demonstrate improved functional strength and transfer efficiency.  Baseline: 20.28 seconds without UE support; 11.6 seconds without UE support on 12/29/22 Goal status: MET  4.  Pt will improve gait speed with no AD to at least 3.3 ft/sec in order to demo improved community mobility.  Baseline: 11.07 seconds = 2.96 ft/sec  10 seconds with no AD = 3.28 ft/sec on 01/26/23 Goal status: MET  5.   Pt will improve manual TUG time to 17 seconds or less with supervision in order to demo decrease fall risk. Baseline: 27 seconds,15 seconds on 01/26/23 Goal status: MET  6.  Pt will verbalize understanding of community resources for PD.  Baseline: pt has looked over it, but has not had the chance to do any exercises  Goal status: MET  UPDATED/ON-GOING LTGS  LONG TERM GOALS: Target date: 02/25/2023  Pt will be independent with PD specific final HEP in order to build upon functional gains made in therapy. Baseline:  Goal status: ON-GOING   2.  Pt will perform stepping strategies in the anterior and posterior direction in 1-2 steps in order to demo improved reactive baseline.  Baseline: unable to initiate forward, 2 delayed steps backwards  Goal status: INITIAL   ASSESSMENT:  CLINICAL IMPRESSION: Today's skilled session focused on reciprocal movement patterns with SciFit, turning with use of blaze pods, and stepping strategies in the anterior/posterior direction with resistance. Pt challenged by stepping on and off air ex with intermittent UE  support and min A for balance with perturbations/resistance. Added alternating forward stepping on floor for improved balance reactions. Discussed with pt and pt's spouse about plan for D/C on Monday and PD screens in 6 months. Both in agreement with plan.   OBJECTIVE IMPAIRMENTS: Abnormal gait, decreased activity tolerance, decreased balance, decreased coordination, decreased mobility, difficulty  walking, decreased strength, impaired flexibility, impaired tone, and postural dysfunction.   ACTIVITY LIMITATIONS: bending, stairs, transfers, dressing, and locomotion level  PARTICIPATION LIMITATIONS: driving and community activity  PERSONAL FACTORS: Behavior pattern, Past/current experiences, Time since onset of injury/illness/exacerbation, and 3+ comorbidities: HTN, Diabetes, HLD, newly diagnosed PD  are also affecting patient's functional outcome.   REHAB POTENTIAL: Good  CLINICAL DECISION MAKING: Evolving/moderate complexity  EVALUATION COMPLEXITY: Moderate  PLAN:  PT FREQUENCY: 1-2x/week  PT DURATION: 4 weeks  PLANNED INTERVENTIONS: Therapeutic exercises, Therapeutic activity, Neuromuscular re-education, Balance training, Gait training, Patient/Family education, Self Care, Stair training, Vestibular training, DME instructions, and Re-evaluation  PLAN FOR NEXT SESSION: turning strategies, stepping strategies.  Finalize HEP. Plan for D/C on Monday   Drake Leach, PT, DPT  02/18/2023, 11:02 AM

## 2023-02-18 NOTE — Therapy (Signed)
OUTPATIENT OCCUPATIONAL THERAPY PARKINSON'S PROGRESS NOTE  Patient Name: Edward Briggs MRN: [id number] DOB:Jan 13, 1951, 72 y.o., male Today's Date: 02/18/2023  PCP: [name], MD  REFERRING PROVIDER: Windell Norfolk, MD   END OF SESSION:  OT End of Session - 02/18/23 1106     Visit Number 12    Number of Visits 25    Date for OT Re-Evaluation 03/06/23    Authorization Type [employer] Medicare    Progress Note Due on Visit 20    OT Start Time 1105    OT Stop Time 1145    OT Time Calculation (min) 40 min    Activity Tolerance Patient tolerated treatment well    Behavior During Therapy WFL for tasks assessed/performed             Past Medical History:  Diagnosis Date   Diabetes mellitus without complication    GERD (gastroesophageal reflux disease)    no meds   Hypertension    controlled on meds   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   Past Surgical History:  Procedure Laterality Date   ABSCESS DRAINAGE     COLONOSCOPY     ~ 10 yrs ago   FRACTURE SURGERY     HARDWARE REMOVAL  03/26/2012   Procedure: HARDWARE REMOVAL;  Surgeon: Sherri Rad, MD;  Location: [facility];  Service: Orthopedics;  Laterality: Left;  hardware removal deep left ankle syndesmotic screws only and stress xrays ankle    ORIF ANKLE FRACTURE  12/03/2011   Procedure: OPEN REDUCTION INTERNAL FIXATION (ORIF) ANKLE FRACTURE;  Surgeon: Sherri Rad, MD;  Location: [facility];  Service: Orthopedics;  Laterality: Left;  left medial malleolus fracture   PILONIDAL CYST EXCISION  ~1982   SKIN GRAFT  ~1970   Lt great toe (graft from Lt thigh)   Patient Active Problem List   Diagnosis Date Noted   HTN (hypertension) 07/22/2019   Acute metabolic encephalopathy 07/22/2019   AKI (acute kidney injury) 07/22/2019   Sepsis 07/22/2019   UTI (urinary tract infection) 07/22/2019   Diabetes mellitus without complication (HCC) 09/10/2012   CHEST PAIN 07/21/2009    ONSET DATE:  12/01/2022  REFERRING DIAG: G20.A1 (ICD-10-CM) - Parkinson's disease without dyskinesia, unspecified whether manifestations fluctuate   THERAPY DIAG:  Other symptoms and signs involving the nervous system  Unsteadiness on feet  Muscle weakness (generalized)  Abnormal posture  Other lack of coordination  Visuospatial deficit  Attention and concentration deficit  Rationale for Evaluation and Treatment: Rehabilitation  SUBJECTIVE:   SUBJECTIVE STATEMENT:  No pain and no falls.   Pt accompanied by: significant other - [name]  PERTINENT HISTORY:   Per PT eval, " PMH: HTN, Diabetes, HLD    Pt saw neurologist Dr. Teresa Coombs on 12/01/22 for memory impairment, per Dr. 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 Pt started on Sinemet.  Noticed a difference in his walking about 4-6 weeks ago. Was started on Sinemet and started taking it (1/31). Has been taking small, shuffled steps. Notices that he will lean forward at times. Getting up from a chair is getting more difficult. Has had 2 falls - one he was getting up and his lost balance, 2nd fall was down the stairs and missed a step."    PRECAUTIONS: Fall  WEIGHT BEARING RESTRICTIONS: No  PAIN:  Are you having pain? Yes: NPRS scale: 0/10 today  Pain location: low back Pain description: achey Aggravating factors: exercises Relieving factors: rest, medication  FALLS: Has patient fallen in last 6 months? Yes. Number of falls 3  LIVING ENVIRONMENT: Lives with: lives with their spouse Lives in: House/apartment Stairs: Yes: Internal: 10 steps; on right going up and External: 4 steps; on right going up Has following equipment at home: None   PLOF: Independent   PATIENT GOALS: Wants to get back to normal.   OBJECTIVE:   HAND DOMINANCE: Right  ADLs: Transfers/ambulation related to ADLs: Eating: mod I Grooming: I UB  Dressing: min A LB Dressing: mod A Toileting: min A with buttoning pants Bathing: Distance supervision  Tub Shower transfers: distance supervision Equipment: Administrator HHSH  IADLs: Shopping: same as PLOF Light housekeeping: mod I Meal Prep: light meal prep, has only been cooking breakfast Community mobility: driving, short distance, no highway Medication management: min A Financial management: I Handwriting: 25% legible  MOBILITY STATUS: Needs Assist: SBA  ACTIVITY TOLERANCE: Activity tolerance: good  FUNCTIONAL OUTCOME MEASURES: Fastening/unfastening 3 buttons: 1:55  Physical performance test: PPT#2 (simulated eating) 27 seconds R = 1 & PPT#4 (donning/doffing jacket): 27 seconds = 1  COORDINATION: 9 Hole Peg test: Right: 55 sec; Left: 42 sec Box and Blocks:  Right 26 blocks, Left 31blocks  Grip strength: 73.4 lbs R ; 74.5 lbs L  UE ROM:  WFL  UE MMT:   WFL  SENSATION: WFL  MUSCLE TONE: RUE: Moderate more prominent in shoulder and LUE: Mild more prominent in elbow  COGNITION: Overall cognitive status: Impaired - impaired motor planning, processing,  OBSERVATIONS: Bradykinesia and Pill rolling L hand;  Difficulty following commands  TODAY'S TREATMENT:         Verbally reviewed bag HEP for dressing and stressed importance of doing the 4 daily prior to dressing to maintain flexibility and for greater ease w/ dressing.  Reviewed techniques for jacket  Practiced hooking/unhooking buttons (simulated shirt on in standing)  Practiced sit to stand transfers w/ cues to scoot forward, feet wide, lean forward and PWR! Up  Practiced donning/doffing Rt sock and shoe w/ cues for big hands and to stretch sock fully over big toe and small toe. Pt leans over to don/doff sock, however pt shown stretch w/ leg crossed over Lt thigh in prep for donning/doffing sock this way  Pt practiced opening twist lids w/ cues for big turns - after practice, pt able to open and close twist  lids in 3 turns.  Reviewed strategies for cutting food including holding utensil in middle, not end and using long/big strokes.   Recommended grab bars for shower for fall prevention as pt stands in shower     PATIENT EDUCATION: Education details: Performing daily activities with big movements, pt also shown leg/hip stretch for greater ease for LE dressing (particularly socks)  Person educated: Patient and Spouse Education method: Medical illustrator, verbal cues, demonstration, handout Education comprehension: verbalized understanding, returned demonstration, and needs further education  HOME EXERCISE PROGRAM: 12/22/22 coordination HEP 3/13: PWR UP and TWIST in sitting and standing 01/21/23: reviewed and issued handwriting strategies (from PD website)  3/25: bag HEP 02/18/23: performing daily activities with big movements, hip stretch for LE dressing   GOALS:  SHORT TERM GOALS: Target date: 01/05/2023    Pt will be independent with PD specific HEP.  Baseline: not yet initiated Goal status: MET w/ cues (wife instructed in how to cue him)  2.  Pt will verbalize understanding of adapted strategies to  maximize safety and independence with ADLs/IADLs.  Baseline: not yet initiated Goal status: MET  3.  Pt will write a sentence with no significant decrease in size and maintain 75% legibility.  Baseline: 25% legibility Goal status: IN PROGRESS - pt meeting this goal w/ strategies and calling out letters (limited by apraxia and ? Some aphasia)   4.  Pt will demonstrate improved ease with fastening buttons as evidenced by decreasing 3 button/unbutton time by 5 seconds -  Baseline: 1 minute 55 seconds Goal status: IN PROGRESS - recommend testing with shirt on     LONG TERM GOALS: Target date: 03/06/2023    Pt will verbalize understanding of ways to prevent future PD related complications and PD community resources.  Baseline: not yet initiated Goal status:  INITIAL  2.  Pt will write a short paragraph with no significant decrease in size and maintain 100% legibility.  Baseline: 25% legibility Goal status: INITIAL  3.  Pt will verbalize understanding of ways to keep thinking skills sharp and ways to compensate for STM changes in the future.  Baseline: not yet initiated Goal status: INITIAL  4.  Pt will demonstrate improved ease with feeding as evidenced by decreasing PPT#2 by 3 secs.  Baseline: 27 secs Goal status: INITIAL  5.  Pt will demonstrate increased ease with dressing as evidenced by decreasing PPT#4 (don/ doff jacket) to 24 secs or less.  Baseline: 27 secs Goal status: INITIAL  6.  Pt will demonstrate improved fine motor coordination for ADLs as evidenced by decreasing 9 hole peg test score for each hand by 5 secs  Baseline: Right: 55 sec; Left: 42 sec  Goal status: INITIAL   7.  Pt will demonstrate improved ease with fastening buttons as evidenced by decreasing 3 button/unbutton time by 10 seconds  Baseline: 1 minute 55 seconds seconds Goal status: INITIAL  8.  Pt will be able to place at least 29 blocks using R hand with completion of Box and Blocks test.  Baseline: 26 blocks Goal status: INITIAL    ASSESSMENT:  CLINICAL IMPRESSION: Pt is progressing towards goals. Pt/ wife verbalize understanding, however pt may benefit from reinforcement. Decreased carryover d/t cognitive deficits. Remains good candidate for skilled OT services to improve independence and safety with ADLs and IADLs.  PERFORMANCE DEFICITS: in functional skills including ADLs, IADLs, coordination, tone, Fine motor control, Gross motor control, mobility, balance, body mechanics, and UE functional use, cognitive skills including attention, perception, problem solving, sequencing, and understand  IMPAIRMENTS: are limiting patient from ADLs, IADLs, and leisure.   COMORBIDITIES:  has co-morbidities such as HTN, HLD, DM  that affects occupational  performance. Patient will benefit from skilled OT to address above impairments and improve overall function.  OT OCCUPATIONAL PROFILE AND HISTORY: Detailed assessment: Review of records and additional review of physical, cognitive, psychosocial history related to current functional performance.  REHAB POTENTIAL: Fair given PD diagnosis   PLAN:  OT FREQUENCY: 2x/week  OT DURATION: 12 weeks  PLANNED INTERVENTIONS: self care/ADL training, therapeutic exercise, therapeutic activity, neuromuscular re-education, manual therapy, functional mobility training, ultrasound, paraffin, fluidotherapy, moist heat, patient/family education, cognitive remediation/compensation, visual/perceptual remediation/compensation, energy conservation, coping strategies training, DME and/or AE instructions, and Re-evaluation  RECOMMENDED OTHER SERVICES: none at this time  CONSULTED AND AGREED WITH PLAN OF CARE: Patient and family member/caregiver  PLAN FOR NEXT SESSION:  assess remaining STG's, standing/reaching task w/ coordination  Sheran Lawless, OT 02/18/2023, 11:07 AM

## 2023-02-20 ENCOUNTER — Ambulatory Visit: Payer: Medicare Other

## 2023-02-20 ENCOUNTER — Ambulatory Visit: Payer: Medicare Other | Admitting: Physical Therapy

## 2023-02-20 DIAGNOSIS — M6281 Muscle weakness (generalized): Secondary | ICD-10-CM

## 2023-02-20 DIAGNOSIS — R29818 Other symptoms and signs involving the nervous system: Secondary | ICD-10-CM | POA: Diagnosis not present

## 2023-02-20 DIAGNOSIS — R2681 Unsteadiness on feet: Secondary | ICD-10-CM

## 2023-02-20 DIAGNOSIS — R278 Other lack of coordination: Secondary | ICD-10-CM

## 2023-02-20 DIAGNOSIS — R41840 Attention and concentration deficit: Secondary | ICD-10-CM

## 2023-02-20 DIAGNOSIS — R41842 Visuospatial deficit: Secondary | ICD-10-CM

## 2023-02-20 DIAGNOSIS — R293 Abnormal posture: Secondary | ICD-10-CM

## 2023-02-20 NOTE — Therapy (Signed)
OUTPATIENT PHYSICAL THERAPY NEURO TREATMENT- 20TH VISIT PROGRESS NOTE   Patient Name: Edward Briggs MRN: [id number] DOB:[birth year], 72 y.o., male Today's Date: 02/20/2023   PCP: [name], MD  REFERRING PROVIDER:  Windell Norfolk, MD  Physical Therapy Progress Note   Dates of Reporting Period: 12/04/22 - 02/20/23  See Note below for Objective Data and Assessment of Progress/Goals.    END OF SESSION:  PT End of Session - 02/20/23 1019     Visit Number 20    Number of Visits 21    Date for PT Re-Evaluation 02/27/23   per re-cert   Authorization Type [employer] Medicare    Progress Note Due on Visit 9   PN done on visit 9   PT Start Time 1017    PT Stop Time 1101    PT Time Calculation (min) 44 min    Equipment Utilized During Treatment Gait belt    Activity Tolerance Patient tolerated treatment well    Behavior During Therapy WFL for tasks assessed/performed                     Past Medical History:  Diagnosis Date   Diabetes mellitus without complication    GERD (gastroesophageal reflux disease)    no meds   Hypertension    controlled on meds   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   Past Surgical History:  Procedure Laterality Date   ABSCESS DRAINAGE     COLONOSCOPY     ~ 10 yrs ago   FRACTURE SURGERY     HARDWARE REMOVAL  03/26/2012   Procedure: HARDWARE REMOVAL;  Surgeon: Sherri Rad, MD;  Location: [facility];  Service: Orthopedics;  Laterality: Left;  hardware removal deep left ankle syndesmotic screws only and stress xrays ankle    ORIF ANKLE FRACTURE  12/03/2011   Procedure: OPEN REDUCTION INTERNAL FIXATION (ORIF) ANKLE FRACTURE;  Surgeon: Sherri Rad, MD;  Location: [facility];  Service: Orthopedics;  Laterality: Left;  left medial malleolus fracture   PILONIDAL CYST EXCISION  ~1982   SKIN GRAFT  ~1970   Lt great toe (graft from Lt thigh)   Patient Active Problem List   Diagnosis Date Noted   HTN (hypertension)  07/22/2019   Acute metabolic encephalopathy 07/22/2019   AKI (acute kidney injury) 07/22/2019   Sepsis 07/22/2019   UTI (urinary tract infection) 07/22/2019   Diabetes mellitus without complication (HCC) 09/10/2012   CHEST PAIN 07/21/2009    ONSET DATE: 12/01/2022   REFERRING DIAG: G20.A1 (ICD-10-CM) - Parkinson's disease without dyskinesia, unspecified whether manifestations fluctuate   THERAPY DIAG:  Unsteadiness on feet  Abnormal posture  Muscle weakness (generalized)  Rationale for Evaluation and Treatment: Rehabilitation  SUBJECTIVE:  SUBJECTIVE STATEMENT: No changes, no falls. Has been doing his exercises. Back pain comes and goes   Pt accompanied by:  Spouse, [name]    PERTINENT HISTORY: PMH: HTN, Diabetes, HLD   Pt saw neurologist Dr. Teresa Coombs on 12/01/22 for memory impairment, per Dr. 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 Pt started on Sinemet   PAIN:  Are you having pain? None.   PRECAUTIONS: Fall  WEIGHT BEARING RESTRICTIONS: No  FALLS: Has patient fallen in last 6 months? Yes. Number of falls 2  LIVING ENVIRONMENT: Lives with: lives with their spouse Lives in: House/apartment Stairs: Yes: Internal: 10 steps; on right going up and External: 4 steps; on right going up Has following equipment at home: None  PLOF: Independent  PATIENT GOALS: Wants to get back to normal.   OBJECTIVE:   DIAGNOSTIC FINDINGS: last brain imaging was a CT of the head done in September 2020 and showed some cerebral atrophy some small vessel white matter disease but no acute intracranial pathology.   COGNITION: Overall cognitive status:  Reports some memory changes.     SENSATION: WFL  COORDINATION: Bradykinesia RLE>LLE    MUSCLE TONE: RLE: Rigidity   POSTURE: rounded shoulders, forward head, posterior pelvic tilt, and flexed trunk    LOWER EXTREMITY MMT:    MMT Right Eval Left Eval  Hip flexion 4+ 4  Hip extension    Hip abduction 5 5  Hip adduction 4+ 4+  Hip internal rotation    Hip external rotation    Knee flexion 5 5  Knee extension 4+ 4+  Ankle dorsiflexion 4+ 4+  Ankle plantarflexion    Ankle inversion    Ankle eversion    (Blank rows = not tested)  All tested in sitting.   TODAY'S TREATMENT:    GAIT Gait pattern: step through pattern, decreased arm swing- Right, decreased arm swing- Left, decreased stride length, decreased hip/knee flexion- Right, decreased hip/knee flexion- Left, decreased ankle dorsiflexion- Right, decreased ankle dorsiflexion- Left, shuffling, festinating, decreased trunk rotation, and poor foot clearance- Right Distance walked: Clinic distances  Assistive device utilized: None Level of assistance: SBA  Comments: Cued for posture, step length and arm swing during session.  NMR: Performed floor transfer from mat table to floor mat x2 w/CGA for safety. Pt initially using lateral technique, which was very difficult for pt, so demonstrated forward technique and had pt teach back and demonstrate, which he did well. Cued pt to use this technique for remainder of session  On floor mat for improved weight shifting, posture, core stability, UE coordination and truncal rotation:  Child's pose to tall kneel transitions w/BUE abduction, x10. Min cues for full hip extension w/tall kneel  Alt spiderman lunge w/rotation x6 per side. Increased difficulty performing on R side but noted improved truncal rotation bilaterally. After 4 reps, pt reported he was tired and was unable to recall proper lunge sequence and started to perform child's pose rather than spiderman lunge, requiring mod cues to correct. RPE of 5/10  following activity and pt performed tall kneel > sit on mat for short seated rest break  5 Blaze pods placed from 6 o'clock to 3 o'clock on mat table on random reach setting for 90s intervals:  Round 1: 39 hits w/pt in quadruped. No LOB noted  Round 2: 38 hits w/pt in quadruped and knees on rockerboard in L/R direction w/airex on top for added  balance challenge. CGA for safety and pt unable to keep R knee on board throughout as he relied on keeping it on the floor mat for stability  Round 3: 20 hits w/pt in quadruped and knees on airex. Added cog dual-task and two pod colors (naming animals if green and food if blue), but pt unable to perform dual-task or scan for blaze pods despite mod cues from wife   PATIENT EDUCATION: Education details: Continue HEP, purpose of session today, plan to DC next session  Person educated: Patient and Spouse Education method: Explanation, Demonstration, and Verbal cues Education comprehension: verbalized understanding, tactile cues required, and needs further education  HOME EXERCISE PROGRAM: Access Code: [email or web address] URL: [email or web address] Date: 02/18/2023 Prepared by: Sherlie Ban  Exercises - Sit to Stand Without Arm Support  - 1 x daily - 7 x weekly - 3 sets - 10 reps - Standing Reach to Opposite Side with Weight Shift  - 1 x daily - 7 x weekly - 3 sets - 10 reps - Seated Windmill Trunk Rotation Stretch  - 1 x daily - 7 x weekly - 3 sets - 10 reps - Step sideways with arms reaching at counter   - 1 x daily - 7 x weekly - 3 sets - 10 reps - Sidelying Thoracic Rotation with Open Book  - 1 x daily - 7 x weekly - 3 sets - 10 reps - Cervical Retraction at Wall  - 1 x daily - 7 x weekly - 3-5 reps - 30 second hold - Sit to stand with band pull-apart   - 1 x daily - 7 x weekly - 3 sets - 10 reps - Lower Trunk Rotation Stretch  - 1 x daily - 7 x weekly - 4-5 reps - 20-30 second hold - Alternating Step Forward with Support  - 1-2 x daily - 5 x  weekly - 1-2 sets - 10 reps  GOALS: Goals reviewed with patient? Yes   LONG TERM GOALS: Target date: 01/29/2023  Pt will be independent with PD specific final HEP in order to build upon functional gains made in therapy. Baseline:  Goal status: ON-GOING   2.  Pt will improve MiniBest to 18/28 for decreased fall risk and improvement with compensatory stepping strategies.   Baseline: 12/28;  18/28 on 01/28/23 Goal status: MET  3.  Pt will improve 5x sit<>stand to less than or equal to 15 sec to demonstrate improved functional strength and transfer efficiency.  Baseline: 20.28 seconds without UE support; 11.6 seconds without UE support on 12/29/22 Goal status: MET  4.  Pt will improve gait speed with no AD to at least 3.3 ft/sec in order to demo improved community mobility.  Baseline: 11.07 seconds = 2.96 ft/sec  10 seconds with no AD = 3.28 ft/sec on 01/26/23 Goal status: MET  5.   Pt will improve manual TUG time to 17 seconds or less with supervision in order to demo decrease fall risk. Baseline: 27 seconds,15 seconds on 01/26/23 Goal status: MET  6.  Pt will verbalize understanding of community resources for PD.  Baseline: pt has looked over it, but has not had the chance to do any exercises  Goal status: MET  UPDATED/ON-GOING LTGS  LONG TERM GOALS: Target date: 02/25/2023  Pt will be independent with PD specific final HEP in order to build upon functional gains made in therapy. Baseline:  Goal status: ON-GOING   2.  Pt will perform stepping strategies in the anterior and posterior direction  in 1-2 steps in order to demo improved reactive baseline.  Baseline: unable to initiate forward, 2 delayed steps backwards  Goal status: INITIAL   ASSESSMENT:  CLINICAL IMPRESSION: Emphasis of skilled PT session on practicing safety w/floor transfers, core stability, postural control and lateral weight shifting. Pt performed floor transfer well once cued to perform in forward  direction. Pt had significant difficulty maintaining stability when on unlevel surface in quadruped, indicative of impaired anticipatory balance strategies. When dual-task added, pt unable to perform task well despite having part-practice beforehand. Pt has noted improvements in posture, gait kinematics and spinal mobility but continued to be limited by impaired cognition and delayed processing. Continue POC.   OBJECTIVE IMPAIRMENTS: Abnormal gait, decreased activity tolerance, decreased balance, decreased coordination, decreased mobility, difficulty walking, decreased strength, impaired flexibility, impaired tone, and postural dysfunction.   ACTIVITY LIMITATIONS: bending, stairs, transfers, dressing, and locomotion level  PARTICIPATION LIMITATIONS: driving and community activity  PERSONAL FACTORS: Behavior pattern, Past/current experiences, Time since onset of injury/illness/exacerbation, and 3+ comorbidities: HTN, Diabetes, HLD, newly diagnosed PD  are also affecting patient's functional outcome.   REHAB POTENTIAL: Good  CLINICAL DECISION MAKING: Evolving/moderate complexity  EVALUATION COMPLEXITY: Moderate  PLAN:  PT FREQUENCY: 1-2x/week  PT DURATION: 4 weeks  PLANNED INTERVENTIONS: Therapeutic exercises, Therapeutic activity, Neuromuscular re-education, Balance training, Gait training, Patient/Family education, Self Care, Stair training, Vestibular training, DME instructions, and Re-evaluation  PLAN FOR NEXT SESSION: turning strategies, stepping strategies.  Finalize HEP. Plan for D/C on Monday   Asante Blanda E Kailynn Satterly, PT, DPT  02/20/2023, 11:55 AM

## 2023-02-20 NOTE — Therapy (Signed)
OUTPATIENT OCCUPATIONAL THERAPY PARKINSON'S PROGRESS NOTE  Patient Name: Edward Briggs MRN: [id number] DOB:February 28, 1951, 72 y.o., male Today's Date: 02/20/2023  PCP: [name], MD  REFERRING PROVIDER: Windell Norfolk, MD   END OF SESSION:  OT End of Session - 02/20/23 1058     Visit Number 13    Number of Visits 25    Date for OT Re-Evaluation 03/06/23    Authorization Type [employer] Medicare    Progress Note Due on Visit 20    OT Start Time 1102    OT Stop Time 1142    OT Time Calculation (min) 40 min    Activity Tolerance Patient tolerated treatment well    Behavior During Therapy WFL for tasks assessed/performed             Past Medical History:  Diagnosis Date   Diabetes mellitus without complication    GERD (gastroesophageal reflux disease)    no meds   Hypertension    controlled on meds   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   Past Surgical History:  Procedure Laterality Date   ABSCESS DRAINAGE     COLONOSCOPY     ~ 10 yrs ago   FRACTURE SURGERY     HARDWARE REMOVAL  03/26/2012   Procedure: HARDWARE REMOVAL;  Surgeon: Sherri Rad, MD;  Location: [facility];  Service: Orthopedics;  Laterality: Left;  hardware removal deep left ankle syndesmotic screws only and stress xrays ankle    ORIF ANKLE FRACTURE  12/03/2011   Procedure: OPEN REDUCTION INTERNAL FIXATION (ORIF) ANKLE FRACTURE;  Surgeon: Sherri Rad, MD;  Location: [facility];  Service: Orthopedics;  Laterality: Left;  left medial malleolus fracture   PILONIDAL CYST EXCISION  ~1982   SKIN GRAFT  ~1970   Lt great toe (graft from Lt thigh)   Patient Active Problem List   Diagnosis Date Noted   HTN (hypertension) 07/22/2019   Acute metabolic encephalopathy 07/22/2019   AKI (acute kidney injury) 07/22/2019   Sepsis 07/22/2019   UTI (urinary tract infection) 07/22/2019   Diabetes mellitus without complication (HCC) 09/10/2012   CHEST PAIN 07/21/2009    ONSET DATE:  12/01/2022  REFERRING DIAG: G20.A1 (ICD-10-CM) - Parkinson's disease without dyskinesia, unspecified whether manifestations fluctuate   THERAPY DIAG:  Other symptoms and signs involving the nervous system  Muscle weakness (generalized)  Abnormal posture  Unsteadiness on feet  Other lack of coordination  Visuospatial deficit  Attention and concentration deficit  Rationale for Evaluation and Treatment: Rehabilitation  SUBJECTIVE:   SUBJECTIVE STATEMENT:  No new complaints this date.  Pt accompanied by: significant other - [name]  PERTINENT HISTORY:   Per PT eval, " PMH: HTN, Diabetes, HLD    Pt saw neurologist Dr. Teresa Coombs on 12/01/22 for memory impairment, per Dr. 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 Pt started on Sinemet.  Noticed a difference in his walking about 4-6 weeks ago. Was started on Sinemet and started taking it (1/31). Has been taking small, shuffled steps. Notices that he will lean forward at times. Getting up from a chair is getting more difficult. Has had 2 falls - one he was getting up and his lost balance, 2nd fall was down the stairs and missed a step."    PRECAUTIONS: Fall  WEIGHT BEARING RESTRICTIONS: No  PAIN:  Are you having pain? No  FALLS: Has patient fallen  in last 6 months? Yes. Number of falls 3  LIVING ENVIRONMENT: Lives with: lives with their spouse Lives in: House/apartment Stairs: Yes: Internal: 10 steps; on right going up and External: 4 steps; on right going up Has following equipment at home: None   PLOF: Independent   PATIENT GOALS: Wants to get back to normal.   OBJECTIVE:   HAND DOMINANCE: Right  ADLs: Transfers/ambulation related to ADLs: Eating: mod I Grooming: I UB Dressing: min A LB Dressing: mod A Toileting: min A with buttoning pants Bathing: Distance supervision  Tub Shower transfers: distance  supervision Equipment: Administrator [facility]SH  IADLs: Shopping: same as PLOF Light housekeeping: mod I Meal Prep: light meal prep, has only been cooking breakfast Community mobility: driving, short distance, no highway Medication management: min A Financial management: I Handwriting: 25% legible  MOBILITY STATUS: Needs Assist: SBA  ACTIVITY TOLERANCE: Activity tolerance: good  FUNCTIONAL OUTCOME MEASURES: Fastening/unfastening 3 buttons: 1:55  Physical performance test: PPT#2 (simulated eating) 27 seconds R = 1 & PPT#4 (donning/doffing jacket): 27 seconds = 1  02/20/23: fastening/unfastening 3 buttons: 47 seconds seated, and 42 seconds in standing    PPT #2: right hand 12 seconds PPT#4: 36 seconds with hospital gown. Pt did not recall to bring personal jacket  COORDINATION: 9 Hole Peg test: Right: 55 sec; Left: 42 sec Box and Blocks:  Right 26 blocks, Left 31blocks  02/20/23: 9 hole peg test: Right 34 seconds, Left 32 seconds  Box and Block: Right 29, Left 35   Grip strength: 73.4 lbs R ; 74.5 lbs L  UE ROM:  WFL  UE MMT:   WFL  SENSATION: WFL  MUSCLE TONE: RUE: Moderate more prominent in shoulder and LUE: Mild more prominent in elbow  COGNITION: Overall cognitive status: Impaired - impaired motor planning, processing,  OBSERVATIONS: Bradykinesia and Pill rolling L hand;  Difficulty following commands  TODAY'S TREATMENT:         Self-care: Reviewed STG/LTG with patient utilizing functional outcome measures and coordination testing to confirm. While performing functional outcome tests, pt instructed to practice big movements to optimize performance. Pt requiring verbal and visual cues for big movements related to donning/doffing hospital gown to simulate jacket, and for donning/doffing of buttons of personal t-shirt.  Home management: Pt performed standing reaching task this visit to reach for cups, plates, and silverware then to place back into specified cabinet. Task  graded for cognitive functioning with post-it notes indicating where items went. Pt requiring cues for technique to safely place items back into place.   PATIENT EDUCATION: Education details: Performing daily activities with big movements, bringing personal jacket, reviewed goals, safety in standing Person educated: Patient and Spouse Education method: Medical illustrator, verbal cues, demonstration, handout Education comprehension: verbalized understanding, returned demonstration, and needs further education  HOME EXERCISE PROGRAM: 12/22/22 coordination HEP 3/13: PWR UP and TWIST in sitting and standing 01/21/23: reviewed and issued handwriting strategies (from PD website)  3/25: bag HEP 02/18/23: performing daily activities with big movements, hip stretch for LE dressing 02/20/23: N/A, no specific HEP  GOALS:  SHORT TERM GOALS: Target date: 01/05/2023    Pt will be independent with PD specific HEP.  Baseline: not yet initiated Goal status: MET w/ cues (wife instructed in how to cue him)  2.  Pt will verbalize understanding of adapted strategies to maximize safety and independence with ADLs/IADLs.  Baseline: not yet initiated Goal status: MET  3.  Pt will write a sentence with no significant decrease  in size and maintain 75% legibility.  Baseline: 25% legibility Goal status: IN PROGRESS - pt meeting this goal w/ strategies and calling out letters (limited by apraxia and ? Some aphasia)   4.  Pt will demonstrate improved ease with fastening buttons as evidenced by decreasing 3 button/unbutton time by 5 seconds -  Baseline: 1 minute 55 seconds 02/20/23: 47 seconds with pt personal shirt Goal status: MET    LONG TERM GOALS: Target date: 03/06/2023    Pt will verbalize understanding of ways to prevent future PD related complications and PD community resources.  Baseline: not yet initiated Goal status: INITIAL  2.  Pt will write a short paragraph with no significant  decrease in size and maintain 100% legibility.  Baseline: 25% legibility Goal status: INITIAL  3.  Pt will verbalize understanding of ways to keep thinking skills sharp and ways to compensate for STM changes in the future.  Baseline: not yet initiated Goal status: INITIAL  4.  Pt will demonstrate improved ease with feeding as evidenced by decreasing PPT#2 by 3 secs.  Baseline: 27 secs 02/20/23: 12 seconds Goal status: MET  5.  Pt will demonstrate increased ease with dressing as evidenced by decreasing PPT#4 (don/ doff jacket) to 24 secs or less.  Baseline: 27 secs 02/20/23: 36 seconds Goal status: IN PROGRESS  6.  Pt will demonstrate improved fine motor coordination for ADLs as evidenced by decreasing 9 hole peg test score for each hand by 5 secs  Baseline: Right: 55 sec; Left: 42 sec  02/20/23: Right 34, Left 32 Goal status: MET  7.  Pt will demonstrate improved ease with fastening buttons as evidenced by decreasing 3 button/unbutton time by 10 seconds  Baseline: 1 minute 55 seconds seconds 02/20/23: 47 seconds seated Goal status: MET  8.  Pt will be able to place at least 29 blocks using R hand with completion of Box and Blocks test.  Baseline: 26 blocks 02/20/23: Right hand 29 blocks Goal status: MET    ASSESSMENT:  CLINICAL IMPRESSION: Pt is progressing towards goals. Pt/ wife verbalize understanding, however pt may benefit from reinforcement. Decreased carryover d/t cognitive deficits. Remains good candidate for skilled OT services to improve independence and safety with ADLs and IADLs.  Pt has met 4/8 LTG this week, and 1 STG. PERFORMANCE DEFICITS: in functional skills including ADLs, IADLs, coordination, tone, Fine motor control, Gross motor control, mobility, balance, body mechanics, and UE functional use, cognitive skills including attention, perception, problem solving, sequencing, and understand  IMPAIRMENTS: are limiting patient from ADLs, IADLs, and leisure.    COMORBIDITIES:  has co-morbidities such as HTN, HLD, DM  that affects occupational performance. Patient will benefit from skilled OT to address above impairments and improve overall function.  OT OCCUPATIONAL PROFILE AND HISTORY: Detailed assessment: Review of records and additional review of physical, cognitive, psychosocial history related to current functional performance.  REHAB POTENTIAL: Fair given PD diagnosis   PLAN:  OT FREQUENCY: 2x/week  OT DURATION: 12 weeks  PLANNED INTERVENTIONS: self care/ADL training, therapeutic exercise, therapeutic activity, neuromuscular re-education, manual therapy, functional mobility training, ultrasound, paraffin, fluidotherapy, moist heat, patient/family education, cognitive remediation/compensation, visual/perceptual remediation/compensation, energy conservation, coping strategies training, DME and/or AE instructions, and Re-evaluation  RECOMMENDED OTHER SERVICES: none at this time  CONSULTED AND AGREED WITH PLAN OF CARE: Patient and family member/caregiver  PLAN FOR NEXT SESSION:  Re-assess jacket with pt personal jacket, coordination, functional ADL/IADL tasks, handwriting  [employer], OT 02/20/2023, 11:45 AM

## 2023-02-23 ENCOUNTER — Ambulatory Visit: Payer: Medicare Other | Admitting: Occupational Therapy

## 2023-02-23 ENCOUNTER — Ambulatory Visit: Payer: Medicare Other | Admitting: Physical Therapy

## 2023-02-23 DIAGNOSIS — R2681 Unsteadiness on feet: Secondary | ICD-10-CM

## 2023-02-23 DIAGNOSIS — M6281 Muscle weakness (generalized): Secondary | ICD-10-CM

## 2023-02-23 DIAGNOSIS — R29818 Other symptoms and signs involving the nervous system: Secondary | ICD-10-CM | POA: Diagnosis not present

## 2023-02-23 DIAGNOSIS — R293 Abnormal posture: Secondary | ICD-10-CM

## 2023-02-23 NOTE — Therapy (Signed)
OUTPATIENT PHYSICAL THERAPY NEURO TREATMENT- DISCHARGE SUMMARY    Patient Name: Edward Briggs MRN: [id number] DOB:[birth year], 72 y.o., male Today's Date: 02/23/2023   PCP: [name], MD  REFERRING PROVIDER:  Windell Norfolk, MD  PHYSICAL THERAPY DISCHARGE SUMMARY  Visits from Start of Care: 21  Current functional level related to goals / functional outcomes: Mod I w/mobility without use of AD   Remaining deficits: Impaired balance strategies, gait deficits 2/2 PD    Education / Equipment: HEP    Patient agrees to discharge. Patient goals were met. Patient is being discharged due to meeting the stated rehab goals.     END OF SESSION:  PT End of Session - 02/23/23 1404     Visit Number 21    Number of Visits 21    Date for PT Re-Evaluation 02/27/23   per re-cert   Authorization Type [employer] Medicare    Progress Note Due on Visit 9   PN done on visit 9   PT Start Time 1403    PT Stop Time 1430   DC   PT Time Calculation (min) 27 min    Equipment Utilized During Treatment Gait belt    Activity Tolerance Patient tolerated treatment well    Behavior During Therapy WFL for tasks assessed/performed                      Past Medical History:  Diagnosis Date   Diabetes mellitus without complication    GERD (gastroesophageal reflux disease)    no meds   Hypertension    controlled on meds   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   Past Surgical History:  Procedure Laterality Date   ABSCESS DRAINAGE     COLONOSCOPY     ~ 10 yrs ago   FRACTURE SURGERY     HARDWARE REMOVAL  03/26/2012   Procedure: HARDWARE REMOVAL;  Surgeon: Sherri Rad, MD;  Location: [facility];  Service: Orthopedics;  Laterality: Left;  hardware removal deep left ankle syndesmotic screws only and stress xrays ankle    ORIF ANKLE FRACTURE  12/03/2011   Procedure: OPEN REDUCTION INTERNAL FIXATION (ORIF) ANKLE FRACTURE;  Surgeon: Sherri Rad, MD;  Location: [facility];  Service: Orthopedics;  Laterality: Left;  left medial malleolus fracture   PILONIDAL CYST EXCISION  ~1982   SKIN GRAFT  ~1970   Lt great toe (graft from Lt thigh)   Patient Active Problem List   Diagnosis Date Noted   HTN (hypertension) 07/22/2019   Acute metabolic encephalopathy 07/22/2019   AKI (acute kidney injury) 07/22/2019   Sepsis 07/22/2019   UTI (urinary tract infection) 07/22/2019   Diabetes mellitus without complication (HCC) 09/10/2012   CHEST PAIN 07/21/2009    ONSET DATE: 12/01/2022   REFERRING DIAG: G20.A1 (ICD-10-CM) - Parkinson's disease without dyskinesia, unspecified whether manifestations fluctuate   THERAPY DIAG:  Unsteadiness on feet  Abnormal posture  Muscle weakness (generalized)  Rationale for Evaluation and Treatment: Rehabilitation  SUBJECTIVE:  SUBJECTIVE STATEMENT: Pt reports doing well. "Where is my party?". No falls or pain today  Pt accompanied by:  Spouse, [name]    PERTINENT HISTORY: PMH: HTN, Diabetes, HLD   Pt saw neurologist Dr. Teresa Coombs on 12/01/22 for memory impairment, per Dr. 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 Pt started on Sinemet   PAIN:  Are you having pain? None.   PRECAUTIONS: Fall  WEIGHT BEARING RESTRICTIONS: No  FALLS: Has patient fallen in last 6 months? Yes. Number of falls 2  LIVING ENVIRONMENT: Lives with: lives with their spouse Lives in: House/apartment Stairs: Yes: Internal: 10 steps; on right going up and External: 4 steps; on right going up Has following equipment at home: None  PLOF: Independent  PATIENT GOALS: Wants to get back to normal.   OBJECTIVE:   DIAGNOSTIC FINDINGS: last brain  imaging was a CT of the head done in September 2020 and showed some cerebral atrophy some small vessel white matter disease but no acute intracranial pathology.   COGNITION: Overall cognitive status:  Reports some memory changes.    SENSATION: WFL  COORDINATION: Bradykinesia RLE>LLE    MUSCLE TONE: RLE: Rigidity   POSTURE: rounded shoulders, forward head, posterior pelvic tilt, and flexed trunk    LOWER EXTREMITY MMT:    MMT Right Eval Left Eval  Hip flexion 4+ 4  Hip extension    Hip abduction 5 5  Hip adduction 4+ 4+  Hip internal rotation    Hip external rotation    Knee flexion 5 5  Knee extension 4+ 4+  Ankle dorsiflexion 4+ 4+  Ankle plantarflexion    Ankle inversion    Ankle eversion    (Blank rows = not tested)  All tested in sitting.   TODAY'S TREATMENT:    Ther Act  LTG Assessment  Reactive balance testing:  In anterior direction: 2 steps In posterior direction- 2 steps Reviewed freezing strategies, as pt reports this happens most often in the AM during his off period. Pt able to teach back "stop and reset" without prior review from therapist  Discussed purpose of PD screens in 53mo and educated pt and wife on returning to PT sooner if pt has significant balance decline. Pt and wife verbalized understanding Strongly encouraged pt to KEEP MOVING and maintain his exercises as well as participate in walking program 3-5 days/week. Educated pt on benefits of high intensity movement w/PD and wanting to maintain the gains made in therapy for reduced fall risk and improved QOL.   GAIT Gait pattern: step through pattern, decreased arm swing- Right, decreased arm swing- Left, decreased stride length, decreased hip/knee flexion- Right, decreased hip/knee flexion- Left, decreased ankle dorsiflexion- Right, decreased ankle dorsiflexion- Left, shuffling, festinating, decreased trunk rotation, and poor foot clearance- Right Distance walked: Clinic distances  Assistive  device utilized: None Level of assistance: Modified independence  Comments: Noted improved arm swing and upright posture this date     PATIENT EDUCATION: Education details: See above Person educated: Patient and Spouse Education method: Medical illustrator Education comprehension: verbalized understanding and returned demonstration  HOME EXERCISE PROGRAM: Access Code: [email or web address] URL: [email or web address] Date: 02/18/2023 Prepared by: Sherlie Ban  Exercises - Sit to Stand Without Arm Support  - 1 x daily - 7 x weekly - 3 sets - 10 reps - Standing Reach to Opposite Side with Weight Shift  - 1 x daily - 7 x weekly -  3 sets - 10 reps - Seated Windmill Trunk Rotation Stretch  - 1 x daily - 7 x weekly - 3 sets - 10 reps - Step sideways with arms reaching at counter   - 1 x daily - 7 x weekly - 3 sets - 10 reps - Sidelying Thoracic Rotation with Open Book  - 1 x daily - 7 x weekly - 3 sets - 10 reps - Cervical Retraction at Wall  - 1 x daily - 7 x weekly - 3-5 reps - 30 second hold - Sit to stand with band pull-apart   - 1 x daily - 7 x weekly - 3 sets - 10 reps - Lower Trunk Rotation Stretch  - 1 x daily - 7 x weekly - 4-5 reps - 20-30 second hold - Alternating Step Forward with Support  - 1-2 x daily - 5 x weekly - 1-2 sets - 10 reps  GOALS: Goals reviewed with patient? Yes   LONG TERM GOALS: Target date: 01/29/2023  Pt will be independent with PD specific final HEP in order to build upon functional gains made in therapy. Baseline:  Goal status: ON-GOING   2.  Pt will improve MiniBest to 18/28 for decreased fall risk and improvement with compensatory stepping strategies.   Baseline: 12/28;  18/28 on 01/28/23 Goal status: MET  3.  Pt will improve 5x sit<>stand to less than or equal to 15 sec to demonstrate improved functional strength and transfer efficiency.  Baseline: 20.28 seconds without UE support; 11.6 seconds without UE support on 12/29/22 Goal  status: MET  4.  Pt will improve gait speed with no AD to at least 3.3 ft/sec in order to demo improved community mobility.  Baseline: 11.07 seconds = 2.96 ft/sec  10 seconds with no AD = 3.28 ft/sec on 01/26/23 Goal status: MET  5.   Pt will improve manual TUG time to 17 seconds or less with supervision in order to demo decrease fall risk. Baseline: 27 seconds,15 seconds on 01/26/23 Goal status: MET  6.  Pt will verbalize understanding of community resources for PD.  Baseline: pt has looked over it, but has not had the chance to do any exercises  Goal status: MET  UPDATED/ON-GOING LTGS  LONG TERM GOALS: Target date: 02/25/2023  Pt will be independent with PD specific final HEP in order to build upon functional gains made in therapy. Baseline:  Goal status: MET  2.  Pt will perform stepping strategies in the anterior and posterior direction in 1-2 steps in order to demo improved reactive baseline.  Baseline: unable to initiate forward, 2 delayed steps backwards; 2 steps fwd, 2 steps backward  Goal status: MET   ASSESSMENT:  CLINICAL IMPRESSION: Emphasis of skilled PT session on LTG assessment and DC from PT. Pt has met 2/2 LTGs, facilitating a stepping strategy in both the anterior and posterior direction this date for the first time. Pt continues to be compliant w/HEP, performing for 20-30 minutes daily, so challenged pt to add a walking program to this. Wife reports they will start walking 2-3 days/week. Pt continues to be limited by forward flexed posture, impaired gait kinematics and delayed processing due to PD but has greatly improved with his balance and safety w/gait during PT. Pt in agreement to DC from PT due to meeting his goals. PD screens scheduled for 53mo.   OBJECTIVE IMPAIRMENTS: Abnormal gait, decreased activity tolerance, decreased balance, decreased coordination, decreased mobility, difficulty walking, decreased strength, impaired flexibility, impaired tone, and  postural dysfunction.   ACTIVITY LIMITATIONS: bending, stairs, transfers, dressing, and locomotion level  PARTICIPATION LIMITATIONS: driving and community activity  PERSONAL FACTORS: Behavior pattern, Past/current experiences, Time since onset of injury/illness/exacerbation, and 3+ comorbidities: HTN, Diabetes, HLD, newly diagnosed PD  are also affecting patient's functional outcome.   REHAB POTENTIAL: Good  CLINICAL DECISION MAKING: Evolving/moderate complexity  EVALUATION COMPLEXITY: Moderate  PLAN:  PT FREQUENCY: 1-2x/week  PT DURATION: 4 weeks  PLANNED INTERVENTIONS: Therapeutic exercises, Therapeutic activity, Neuromuscular re-education, Balance training, Gait training, Patient/Family education, Self Care, Stair training, Vestibular training, DME instructions, and Re-evaluation   Saramarie Stinger E Dalesha Stanback, PT, DPT  02/23/2023, 8:32 PM

## 2023-02-25 ENCOUNTER — Ambulatory Visit: Payer: Medicare Other | Admitting: Occupational Therapy

## 2023-02-25 ENCOUNTER — Ambulatory Visit: Payer: Medicare Other | Admitting: Physical Therapy

## 2023-02-25 ENCOUNTER — Encounter: Payer: Self-pay | Admitting: Occupational Therapy

## 2023-02-25 DIAGNOSIS — R278 Other lack of coordination: Secondary | ICD-10-CM

## 2023-02-25 DIAGNOSIS — R29818 Other symptoms and signs involving the nervous system: Secondary | ICD-10-CM | POA: Diagnosis not present

## 2023-02-25 NOTE — Therapy (Signed)
OUTPATIENT OCCUPATIONAL THERAPY [facility] PROGRESS NOTE  Patient Name: Edward Briggs MRN: [id number] DOB:[birth year], 72 y.o., male Today's Date: 02/25/2023  PCP: [name], MD  REFERRING PROVIDER: Windell Norfolk, MD   END OF SESSION:  OT End of Session - 02/25/23 1542     Visit Number 14    Number of Visits 25    Date for OT Re-Evaluation 03/06/23    Authorization Type [employer] Medicare    Progress Note Due on Visit 20    OT Start Time 1542             Past Medical History:  Diagnosis Date   Diabetes mellitus without complication    GERD (gastroesophageal reflux disease)    no meds   Hypertension    controlled on meds   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   Past Surgical History:  Procedure Laterality Date   ABSCESS DRAINAGE     COLONOSCOPY     ~ 10 yrs ago   FRACTURE SURGERY     HARDWARE REMOVAL  03/26/2012   Procedure: HARDWARE REMOVAL;  Surgeon: Sherri Rad, MD;  Location: [facility];  Service: Orthopedics;  Laterality: Left;  hardware removal deep left ankle syndesmotic screws only and stress xrays ankle    ORIF ANKLE FRACTURE  12/03/2011   Procedure: OPEN REDUCTION INTERNAL FIXATION (ORIF) ANKLE FRACTURE;  Surgeon: Sherri Rad, MD;  Location: [facility];  Service: Orthopedics;  Laterality: Left;  left medial malleolus fracture   PILONIDAL CYST EXCISION  ~1982   SKIN GRAFT  ~1970   Lt great toe (graft from Lt thigh)   Patient Active Problem List   Diagnosis Date Noted   HTN (hypertension) 07/22/2019   Acute metabolic encephalopathy 07/22/2019   AKI (acute kidney injury) 07/22/2019   Sepsis 07/22/2019   UTI (urinary tract infection) 07/22/2019   Diabetes mellitus without complication (HCC) 09/10/2012   CHEST PAIN 07/21/2009    ONSET DATE: 12/01/2022  REFERRING DIAG: G20.A1 (ICD-10-CM) - Parkinson's disease without dyskinesia, unspecified whether manifestations fluctuate   THERAPY DIAG:  No diagnosis  found.  Rationale for Evaluation and Treatment: Rehabilitation  SUBJECTIVE:   SUBJECTIVE STATEMENT:  No new complaints this date.  Pt accompanied by: significant other - [name]  PERTINENT HISTORY:   Per PT eval, " PMH: HTN, Diabetes, HLD    Pt saw neurologist Dr. Teresa Coombs on 12/01/22 for memory impairment, per Dr. 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 Pt started on Sinemet.  Noticed a difference in his walking about 4-6 weeks ago. Was started on Sinemet and started taking it (1/31). Has been taking small, shuffled steps. Notices that he will lean forward at times. Getting up from a chair is getting more difficult. Has had 2 falls - one he was getting up and his lost balance, 2nd fall was down the stairs and missed a step."    PRECAUTIONS: Fall  WEIGHT BEARING RESTRICTIONS: No  PAIN:  Are you having pain? No  FALLS: Has patient fallen in last 6 months? Yes. Number of falls 3  LIVING ENVIRONMENT: Lives with: lives with their spouse Lives in: House/apartment Stairs: Yes: Internal: 10 steps; on right going up and External: 4 steps; on right going up Has following equipment at home: None   PLOF: Independent   PATIENT GOALS: Wants to get back to normal.   OBJECTIVE:   HAND  DOMINANCE: Right  ADLs: Transfers/ambulation related to ADLs: Eating: mod I Grooming: I UB Dressing: min A LB Dressing: mod A Toileting: min A with buttoning pants Bathing: Distance supervision  Tub Shower transfers: distance supervision Equipment: Administrator HHSH  IADLs: Shopping: same as PLOF Light housekeeping: mod I Meal Prep: light meal prep, has only been cooking breakfast Community mobility: driving, short distance, no highway Medication management: min A Financial management: I Handwriting: 25% legible  MOBILITY STATUS: Needs Assist: SBA  ACTIVITY TOLERANCE: Activity  tolerance: good  FUNCTIONAL OUTCOME MEASURES: Fastening/unfastening 3 buttons: 1:55  Physical performance test: PPT#2 (simulated eating) 27 seconds R = 1 & PPT#4 (donning/doffing jacket): 27 seconds = 1  02/20/23: fastening/unfastening 3 buttons: 47 seconds seated, and 42 seconds in standing    PPT #2: right hand 12 seconds PPT#4: 36 seconds with hospital gown. Pt did not recall to bring personal jacket  02/25/23 PPT#4: 24 & 31 seconds with personal jacket  COORDINATION: 9 Hole Peg test: Right: 55 sec; Left: 42 sec Box and Blocks:  Right 26 blocks, Left 31blocks  02/20/23: 9 hole peg test: Right 34 seconds, Left 32 seconds  Box and Block: Right 29, Left 35   Grip strength: 73.4 lbs R ; 74.5 lbs L  UE ROM:  WFL  UE MMT:   WFL  SENSATION: WFL  MUSCLE TONE: RUE: Moderate more prominent in shoulder and LUE: Mild more prominent in elbow  COGNITION: Overall cognitive status: Impaired - impaired motor planning, processing,  OBSERVATIONS: Bradykinesia and Pill rolling L hand;  Difficulty following commands  TODAY'S TREATMENT:         Therapeutic activities:  Reviewed HEP ideas related to coordination with use of patient handout from 12/22/22 with resident engaged in tasks with tactile, verbal and visual cues as needed.  Coordination tasks included flipping, sorting and dealing cards; rotating balls in palm; stacking items such as Connect 4 checkers and practicing writing.  Also reviewed PWR! Hands/stretches as previously instructed by previous OT to help when motions get too small and difficult for him.  Patient required instruction, increased time and mod v.c and demonstration to alternate hands with card sorting and to align checkers to minimize his tower form tipping over. Handwriting activity conducted without AE in place and good tripod grasp with difficulty copying a novel phrase but fairly good success with his signature with only minimal decrease in size of last name compared to  first name.  He switch to printing for novel phrase but needed VCs throughout for words, spelling and even individual letters  Self Care: Patient continued to be instructed to practice big movements to optimize ease of dressing activities but still requiring verbal and visual cues for big movements.  He remembered his own jacket today and was able to don/doff jacket in 24-31 seconds in 2 trials.                                                                                                         PATIENT EDUCATION: Education details: reviewed coordination HEP; Performing daily activities  with big movements Person educated: Patient and Spouse Education method: Explanation and Demonstration, reviewed handout, verbal cues, demonstration Education comprehension: verbalized understanding, returned demonstration, and needs further education   HOME EXERCISE PROGRAM: 3/13: PWR UP and TWIST in sitting and standing 01/21/23: reviewed and issued handwriting strategies (from PD website)  3/25: bag HEP 02/18/23: performing daily activities with big movements, hip stretch for LE dressing 02/25/23 - reviewed Coordination HEP from 12/22/22  GOALS:  SHORT TERM GOALS: Target date: 01/05/2023    Pt will be independent with PD specific HEP.  Baseline: not yet initiated Goal status: MET w/ cues (wife instructed in how to cue him)  2.  Pt will verbalize understanding of adapted strategies to maximize safety and independence with ADLs/IADLs.  Baseline: not yet initiated Goal status: MET  3.  Pt will write a sentence with no significant decrease in size and maintain 75% legibility.  Baseline: 25% legibility Goal status: IN PROGRESS - pt meeting this goal w/ strategies and calling out letters (limited by apraxia and ? Some aphasia)   4.  Pt will demonstrate improved ease with fastening buttons as evidenced by decreasing 3 button/unbutton time by 5 seconds -  Baseline: 1 minute 55 seconds 02/20/23: 47  seconds with pt personal shirt Goal status: MET    LONG TERM GOALS: Target date: 03/06/2023    Pt will verbalize understanding of ways to prevent future PD related complications and PD community resources.  Baseline: not yet initiated Goal status: INITIAL  2.  Pt will write a short paragraph with no significant decrease in size and maintain 100% legibility.  Baseline: 25% legibility Goal status: INITIAL  3.  Pt will verbalize understanding of ways to keep thinking skills sharp and ways to compensate for STM changes in the future.  Baseline: not yet initiated Goal status: INITIAL  4.  Pt will demonstrate improved ease with feeding as evidenced by decreasing PPT#2 by 3 secs.  Baseline: 27 secs 02/20/23: 12 seconds Goal status: MET  5.  Pt will demonstrate increased ease with dressing as evidenced by decreasing PPT#4 (don/ doff jacket) to 24 secs or less.  Baseline: 27 secs 02/20/23: 36 seconds Goal status: IN PROGRESS  6.  Pt will demonstrate improved fine motor coordination for ADLs as evidenced by decreasing 9 hole peg test score for each hand by 5 secs  Baseline: Right: 55 sec; Left: 42 sec  02/20/23: Right 34, Left 32 Goal status: MET  7.  Pt will demonstrate improved ease with fastening buttons as evidenced by decreasing 3 button/unbutton time by 10 seconds  Baseline: 1 minute 55 seconds seconds 02/20/23: 47 seconds seated Goal status: MET  8.  Pt will be able to place at least 29 blocks using R hand with completion of Box and Blocks test.  Baseline: 26 blocks 02/20/23: Right hand 29 blocks Goal status: MET    ASSESSMENT:  CLINICAL IMPRESSION: Pt continues to progress towards goals and demonstrated donning and doffing his jacket at beginning of session without help form his wife. Wife reports they still have the coordination activities list previously provided and reviewed today with verbalized understanding of suggestion to work on FMS throughout the day by  keeping them near where he sits but will continue to benefit from reinforcement. Remains good candidate for skilled OT services to improve independence and safety with ADLs and IADLs.    PERFORMANCE DEFICITS: in functional skills including ADLs, IADLs, coordination, tone, Fine motor control, Gross motor control, mobility, balance, body mechanics, and  UE functional use, cognitive skills including attention, perception, problem solving, sequencing, and understand  IMPAIRMENTS: are limiting patient from ADLs, IADLs, and leisure.   COMORBIDITIES:  has co-morbidities such as HTN, HLD, DM  that affects occupational performance. Patient will benefit from skilled OT to address above impairments and improve overall function.  OT OCCUPATIONAL PROFILE AND HISTORY: Detailed assessment: Review of records and additional review of physical, cognitive, psychosocial history related to current functional performance.  REHAB POTENTIAL: Fair given PD diagnosis   PLAN:  OT FREQUENCY: 2x/week  OT DURATION: 12 weeks  PLANNED INTERVENTIONS: self care/ADL training, therapeutic exercise, therapeutic activity, neuromuscular re-education, manual therapy, functional mobility training, ultrasound, paraffin, fluidotherapy, moist heat, patient/family education, cognitive remediation/compensation, visual/perceptual remediation/compensation, energy conservation, coping strategies training, DME and/or AE instructions, and Re-evaluation  RECOMMENDED OTHER SERVICES: none at this time  CONSULTED AND AGREED WITH PLAN OF CARE: Patient and family member/caregiver  PLAN FOR NEXT SESSION:  Re-assess jacket with pt personal jacket, coordination, functional ADL/IADL tasks, handwriting  Victorino Sparrow, OT 02/25/2023, 3:45 PM

## 2023-03-02 ENCOUNTER — Ambulatory Visit: Payer: Medicare Other | Admitting: Physical Therapy

## 2023-03-02 ENCOUNTER — Ambulatory Visit: Payer: Medicare Other | Admitting: Occupational Therapy

## 2023-03-02 DIAGNOSIS — R29818 Other symptoms and signs involving the nervous system: Secondary | ICD-10-CM

## 2023-03-02 DIAGNOSIS — R278 Other lack of coordination: Secondary | ICD-10-CM

## 2023-03-02 DIAGNOSIS — M6281 Muscle weakness (generalized): Secondary | ICD-10-CM

## 2023-03-02 DIAGNOSIS — R41842 Visuospatial deficit: Secondary | ICD-10-CM

## 2023-03-02 NOTE — Therapy (Signed)
OUTPATIENT OCCUPATIONAL THERAPY [facility] THERAPY NOTE  Patient Name: Edward Briggs MRN: [id number] DOB:1951/07/08, 72 y.o., male Today's Date: 03/02/2023  PCP: [name], MD  REFERRING PROVIDER: Windell Norfolk, MD   END OF SESSION:  OT End of Session - 03/02/23 1401     Visit Number 15    Number of Visits 25    Date for OT Re-Evaluation 03/06/23    Authorization Type [employer] Medicare    Progress Note Due on Visit 20    OT Start Time 1404    OT Stop Time 1445    OT Time Calculation (min) 41 min    Activity Tolerance Patient tolerated treatment well;No increased pain    Behavior During Therapy WFL for tasks assessed/performed             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   Past Surgical History:  Procedure Laterality Date   ABSCESS DRAINAGE     COLONOSCOPY     ~ 10 yrs ago   FRACTURE SURGERY     HARDWARE REMOVAL  03/26/2012   Procedure: HARDWARE REMOVAL;  Surgeon: Sherri Rad, MD;  Location: [facility];  Service: Orthopedics;  Laterality: Left;  hardware removal deep left ankle syndesmotic screws only and stress xrays ankle    ORIF ANKLE FRACTURE  12/03/2011   Procedure: OPEN REDUCTION INTERNAL FIXATION (ORIF) ANKLE FRACTURE;  Surgeon: Sherri Rad, MD;  Location: [facility];  Service: Orthopedics;  Laterality: Left;  left medial malleolus fracture   PILONIDAL CYST EXCISION  ~1982   SKIN GRAFT  ~1970   Lt great toe (graft from Lt thigh)   Patient Active Problem List   Diagnosis Date Noted   HTN (hypertension) 07/22/2019   Acute metabolic encephalopathy 07/22/2019   AKI (acute kidney injury) (HCC) 07/22/2019   Sepsis (HCC) 07/22/2019   UTI (urinary tract infection) 07/22/2019   Diabetes mellitus without complication (HCC) 09/10/2012   CHEST PAIN  07/21/2009    ONSET DATE: 12/01/2022  REFERRING DIAG: G20.A1 (ICD-10-CM) - Parkinson's disease without dyskinesia, unspecified whether manifestations fluctuate   THERAPY DIAG:  Other lack of coordination  Muscle weakness (generalized)  Other symptoms and signs involving the nervous system  Visuospatial deficit  Rationale for Evaluation and Treatment: Rehabilitation  SUBJECTIVE:   SUBJECTIVE STATEMENT:  He has been completing his HEP as instructed.  Pt accompanied by: significant other - [name]  PERTINENT HISTORY:   Per PT eval, " PMH: HTN, Diabetes, HLD    Pt saw neurologist Dr. Teresa Coombs on 12/01/22 for memory impairment, per Dr. 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 Pt started on Sinemet.  Noticed a difference in his walking about 4-6 weeks ago. Was started on Sinemet and started taking it (1/31). Has been taking small, shuffled steps. Notices that he will lean forward at times. Getting up from a chair is getting more difficult. Has had 2 falls - one he was getting up and his lost balance, 2nd fall was down the stairs and missed a step."    PRECAUTIONS: Fall  WEIGHT BEARING RESTRICTIONS: No  PAIN:  Are you having pain? No  FALLS: Has patient fallen in last 6 months?  Yes. Number of falls 3  LIVING ENVIRONMENT: Lives with: lives with their spouse Lives in: House/apartment Stairs: Yes: Internal: 10 steps; on right going up and External: 4 steps; on right going up Has following equipment at home: None   PLOF: Independent   PATIENT GOALS: Wants to get back to normal.   OBJECTIVE:   HAND DOMINANCE: Right  ADLs: Transfers/ambulation related to ADLs: Eating: mod I Grooming: I UB Dressing: min A LB Dressing: mod A Toileting: min A with buttoning pants Bathing: Distance supervision  Tub Shower transfers: distance supervision Equipment: Administrator  HHSH  IADLs: Shopping: same as PLOF Light housekeeping: mod I Meal Prep: light meal prep, has only been cooking breakfast Community mobility: driving, short distance, no highway Medication management: min A Financial management: I Handwriting: 25% legible  MOBILITY STATUS: Needs Assist: SBA  ACTIVITY TOLERANCE: Activity tolerance: good  FUNCTIONAL OUTCOME MEASURES: Fastening/unfastening 3 buttons: 1:55  Physical performance test: PPT#2 (simulated eating) 27 seconds R = 1 & PPT#4 (donning/doffing jacket): 27 seconds = 1  02/20/23: fastening/unfastening 3 buttons: 47 seconds seated, and 42 seconds in standing    PPT #2: right hand 12 seconds PPT#4: 36 seconds with hospital gown. Pt did not recall to bring personal jacket  02/25/23 PPT#4: 24 & 31 seconds with personal jacket  COORDINATION: 9 Hole Peg test: Right: 55 sec; Left: 42 sec Box and Blocks:  Right 26 blocks, Left 31blocks  02/20/23: 9 hole peg test: Right 34 seconds, Left 32 seconds  Box and Block: Right 29, Left 35   Grip strength: 73.4 lbs R ; 74.5 lbs L  UE ROM:  WFL  UE MMT:   WFL  SENSATION: WFL  MUSCLE TONE: RUE: Moderate more prominent in shoulder and LUE: Mild more prominent in elbow  COGNITION: Overall cognitive status: Impaired - impaired motor planning, processing,  OBSERVATIONS: Bradykinesia and Pill rolling L hand;  Difficulty following commands  TODAY'S TREATMENT:         - Self-care/home management completed for duration as noted below including: Patient continued to be instructed to practice big movements to optimize ease of dressing activities but still requiring verbal and visual cues for big movements.  He remembered his own jacket today but was unable to demonstrate cape method adequately.   OT provided community resources  and keeping thinking skills sharp handouts as noted in pt instructions.                                                                                                       PATIENT EDUCATION: Education details: PD community resources; keeping thinking skills sharp; upcoming d/c; PD screening Person educated: Patient and Spouse Education method: Explanation, reviewed handout, verbal cues, demonstration Education comprehension: verbalized understanding, and needs further education  HOME EXERCISE PROGRAM: 3/13: PWR UP and TWIST in sitting and standing 01/21/23: reviewed and issued handwriting strategies (from PD website)  3/25: bag HEP 02/18/23: performing daily activities with big movements, hip stretch for LE dressing 02/25/23 - reviewed Coordination HEP from 12/22/22  GOALS:  SHORT TERM GOALS: Target date: 01/05/2023  Pt will be independent with PD specific HEP.  Baseline: not yet initiated Goal status: MET w/ cues (wife instructed in how to cue him)  2.  Pt will verbalize understanding of adapted strategies to maximize safety and independence with ADLs/IADLs.  Baseline: not yet initiated Goal status: MET  3.  Pt will write a sentence with no significant decrease in size and maintain 75% legibility.  Baseline: 25% legibility Goal status: IN PROGRESS - pt meeting this goal w/ strategies and calling out letters (limited by apraxia and ? Some aphasia)   4.  Pt will demonstrate improved ease with fastening buttons as evidenced by decreasing 3 button/unbutton time by 5 seconds -  Baseline: 1 minute 55 seconds 02/20/23: 47 seconds with pt personal shirt Goal status: MET    LONG TERM GOALS: Target date: 03/06/2023    1. Pt will verbalize understanding of ways to prevent future PD related complications and PD community resources.  Baseline: not yet initiated Goal status: MET  2.  Pt will write a short paragraph with no significant decrease in size and maintain 100% legibility.  Baseline: 25% legibility Goal status: INITIAL  3.  Pt will verbalize understanding of ways to keep thinking skills sharp and ways to compensate for STM changes in  the future.  Baseline: not yet initiated Goal status: MET  4.  Pt will demonstrate improved ease with feeding as evidenced by decreasing PPT#2 by 3 secs.  Baseline: 27 secs 02/20/23: 12 seconds Goal status: MET  5.  Pt will demonstrate increased ease with dressing as evidenced by decreasing PPT#4 (don/ doff jacket) to 24 secs or less.  Baseline: 27 secs 02/20/23: 36 seconds Goal status: IN PROGRESS  6.  Pt will demonstrate improved fine motor coordination for ADLs as evidenced by decreasing 9 hole peg test score for each hand by 5 secs  Baseline: Right: 55 sec; Left: 42 sec  02/20/23: Right 34, Left 32 Goal status: MET  7.  Pt will demonstrate improved ease with fastening buttons as evidenced by decreasing 3 button/unbutton time by 10 seconds  Baseline: 1 minute 55 seconds seconds 02/20/23: 47 seconds seated Goal status: MET  8.  Pt will be able to place at least 29 blocks using R hand with completion of Box and Blocks test.  Baseline: 26 blocks 02/20/23: Right hand 29 blocks Goal status: MET    ASSESSMENT:  CLINICAL IMPRESSION: Pt and wife understand therapy recommendations and PD resources as needed to limit functional decline secondary to PD prognosis. Pt to benefit from review of handwriting and jacket donning and doffing with emphasis on incorporation of LARGE AMPLITUDE movements. Appropriate for d/c during next visit with follow-up therapy screen in 6 months.  PERFORMANCE DEFICITS: in functional skills including ADLs, IADLs, coordination, tone, Fine motor control, Gross motor control, mobility, balance, body mechanics, and UE functional use, cognitive skills including attention, perception, problem solving, sequencing, and understand  IMPAIRMENTS: are limiting patient from ADLs, IADLs, and leisure.   COMORBIDITIES:  has co-morbidities such as HTN, HLD, DM  that affects occupational performance. Patient will benefit from skilled OT to address above impairments and improve  overall function.  OT OCCUPATIONAL PROFILE AND HISTORY: Detailed assessment: Review of records and additional review of physical, cognitive, psychosocial history related to current functional performance.  REHAB POTENTIAL: Fair given PD diagnosis   PLAN:  OT FREQUENCY: 2x/week  OT DURATION: 12 weeks  PLANNED INTERVENTIONS: self care/ADL training, therapeutic exercise, therapeutic activity, neuromuscular re-education, manual therapy, functional mobility training,  ultrasound, paraffin, fluidotherapy, moist heat, patient/family education, cognitive remediation/compensation, visual/perceptual remediation/compensation, energy conservation, coping strategies training, DME and/or AE instructions, and Re-evaluation  RECOMMENDED OTHER SERVICES: none at this time  CONSULTED AND AGREED WITH PLAN OF CARE: Patient and family member/caregiver  PLAN FOR NEXT SESSION:  Re-assess handwriting, jacket with pt personal jacket, d/c visit (screening already scheduled)  Delana Meyer, OT 03/02/2023, 4:43 PM

## 2023-03-02 NOTE — Patient Instructions (Addendum)
Community Parkinson's Exercise Programs    Parkinson's Wellness Recovery Exercise Programs:    PWR! Moves PD Exercise Class:  This is a therapist-led exercise class for people with Parkinson's disease in the [place] community. It consists of a one-hour exercise class each week. Classes are offered in eight-week sessions, and the cost per session is $80. Class size is limited to a maximum of 20 participants. Participant criteria includes: Participant must be able to get up and down from the floor with minimal to no assistance, have had 0-1 falls in the past 6 months, and have completed physical or occupational therapy at [facility] within the past year.   To find out more about session dates, questions, or to register, please contact Lonia Blood, Physical Therapist, or Modena Morrow, Physical Environmental health practitioner, at [facility] at [phone].   PWR! Circuit Class:  This is a therapist-led exercise class with intervals of circuit activities incorporating PWR! Moves into functional activities. It consists of one 45-minute exercise class per week. Classes are offered in eight-week sessions, and the cost per session is $120. Class size is limited to a maximum of eight participants to allow for hands-on instruction. Participant criteria: class is ideal for people with Parkinson's disease who have completed PWR! Moves Exercise Class or who are currently independently exercising and want to be challenged, must be able to walk independently with 0-1 falls in the past 6 months, able to get up and down from the floor independently, able to sit to stand independently, and able to jog 20 feet.    To find out more about session dates, questions, or to register, please contact Lonia Blood, Physical Therapist, or Modena Morrow, Physical Environmental health practitioner, at [facility] at [phone].    [employer] Parkinson's Cycle:     Parkinson's Cycle Class at [facility] This is an ongoing class on Monday and Thursday mornings at 10:45 a.m. A healthcare provider referral is required to enroll. This class is FREE to participants, and you do not have to be a member of the [employer] to enroll. Contact Beth at [phone] or [email or web address]. Parkinson's Cycle Class at [facility] Ongoing Class Monday, Wednesday, and Friday mornings at 9:00 a.m. A healthcare provider referral is required to enroll. This class is FREE to participants, and you do not have to be a member of the [employer] to enroll. Contact Marlee at [phone] or [email or web address]. Parkinson's Cycle Class at [facility] Ongoing Class every Friday mornings at 12 p.m.  A healthcare provider referral is required to enroll. This class is FREE to participants, and you do not have to be a member of the [employer] to enroll. Contact [phone].  Parkinson's Cycle Class at [facility] Ongoing Class every Monday at 12pm.  A healthcare provider referral is required to enroll. This class is FREE to participants, and you do not have to be a member of the [employer] to enroll. Contact Raynelle Fanning at [phone] or  [email or web address].    Rock [employer]ing:  [employer]  Classes are offered Mondays at 5:15 p.m. and Tuesdays and Thursdays at 12 p.m. at [employer]. For more information, contact [phone] or visit [email or web address] or [email or web address]. [employer] [place] Classes are offered Monday, Wednesday, and Friday from 9:30 a.m. - 11:00 a.m. For more information, contact [phone] or [phone] or email [email or web address] or visit [email or web address] or [email or web address]. [employer] (classes are offered at 2 locations) [place]  [employer] in [place] (for more information, contact Thad Stovall at [phone]  or email [email or web address] [place] at [facility] (class is open to the public -- for more information, contact Arn Medal at [phone] or email [email or web address]) Otho Bellows [place] Classes are held at [facility] in [place], [place]. For more information, call Dr. Ventura Bruns at [phone] or [email or web address].    Personal Training for Parkinson's:   [employer] Offers certified personal training to customize a program to meet your exercise needs to address Parkinson's disease. For more information, contact [phone] or visit [email or web address].   Community Dance for Parkinson's:   Community dance class for people with Parkinson's Disease Wednesdays at 9 a.m. [employer] of [address] [place], [place] [address] Please contact Arrie Senate [phone] for more information   Scholarships Available for Fitness Programs:  The [employer] for [employer] is a non-profit 501(C)3 organization run by volunteers, whose mission is to strive to empower those living with Parkinson's Disease (PD), Progressive Supra-Nuclear Palsy (PSP) and Multiple System Atrophy (MSA).  Through financial support, recipients benefit from individual and group programs. [phone] [email or web address]     Powering Together for Parkinson's & Movement Disorders  The [place] Parkinson's and Movement Disorders team know that living well with a movement disorder extends far beyond our clinic walls. We are together with you. Our team is passionate about providing resources to you and your loved ones who are living with Parkinson's disease and movement disorders. Participate in these programs and join our community. These resources are free or low cost!     Parkinson's and Movement Disorders Program is adding:   Innovative educational programs for patients and caregivers.   Support groups for patients and caregivers living  with Parkinson's disease.   Parkinson's specific exercise programs.   Custom tailored therapeutic programs that will benefit patient's living with Parkinson's disease.    We are in this together. You can help and contribute to grow these programs and resources in our community. 100% of the funds donated to the Movement Disorders Fund stays right here in our community to support patients and their caregivers.  To make a tax deductible contribution:  -ask for a Power Together for Parkinson's envelope in the office today.  - call the Office of Institutional Advancement at [phone].          Keeping Thinking Skills Sharp: 1. Jigsaw puzzles 2. Card/board games 3. Talking on the phone/social events 4. Lumosity.com 5. Online games 6. Word serches/crossword puzzles 7.  Logic puzzles 8. Aerobic exercise (stationary bike) 9. Eating balanced diet (fruits & veggies) 10. Drink water 11. Try something new--new recipe, hobby 12. Crafts 13. Do a variety of activities that are challenging 14.  Plan weekly meals and write a grocery list 15. Add cognitive activities to walking/exercising (think of animal/food/city with each letter of the alphabet, counting backwards, thinking of as many vegetables as you can, etc.).--Only do this  If safe (no freezing/falls).

## 2023-03-04 ENCOUNTER — Ambulatory Visit: Payer: Medicare Other | Attending: Neurology | Admitting: Occupational Therapy

## 2023-03-04 ENCOUNTER — Ambulatory Visit: Payer: Medicare Other | Admitting: Physical Therapy

## 2023-03-04 ENCOUNTER — Encounter: Payer: Self-pay | Admitting: Occupational Therapy

## 2023-03-04 DIAGNOSIS — R293 Abnormal posture: Secondary | ICD-10-CM

## 2023-03-04 DIAGNOSIS — R278 Other lack of coordination: Secondary | ICD-10-CM

## 2023-03-04 DIAGNOSIS — M6281 Muscle weakness (generalized): Secondary | ICD-10-CM

## 2023-03-04 DIAGNOSIS — R2681 Unsteadiness on feet: Secondary | ICD-10-CM | POA: Insufficient documentation

## 2023-03-04 DIAGNOSIS — R29818 Other symptoms and signs involving the nervous system: Secondary | ICD-10-CM | POA: Diagnosis not present

## 2023-03-04 NOTE — Patient Instructions (Signed)
To don jacket: (feet wide)   1) Hold with tag facing out, collar turned out, hands wide in line with sleeves  2) fling around big like a cape  3) punch one arm in, then punch other arm in   To doff jacket (feet wide)   1) Big hands holding each side at belly button level   2) Twist and look over shoulder to take off one shoulder  3) Repeat to take off other shoulder  4) Reach big behind you and grab end of sleeve  5) Yank off one arm, then other arm

## 2023-03-04 NOTE — Therapy (Signed)
OUTPATIENT OCCUPATIONAL THERAPY PARKINSON'S THERAPY NOTE  Patient Name: Edward Briggs MRN: [id number] DOB:[birth year], 72 y.o., male Today's Date: 03/04/2023  PCP: [name], MD  REFERRING PROVIDER: Windell Norfolk, MD   END OF SESSION:  OT End of Session - 03/04/23 1322     Visit Number 16    Number of Visits 25    Date for OT Re-Evaluation 03/06/23    Authorization Type [employer] Medicare    Progress Note Due on Visit 20    OT Start Time 1320    OT Stop Time 1350    OT Time Calculation (min) 30 min    Activity Tolerance Patient tolerated treatment well;No increased pain    Behavior During Therapy WFL for tasks assessed/performed             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   Past Surgical History:  Procedure Laterality Date   ABSCESS DRAINAGE     COLONOSCOPY     ~ 10 yrs ago   FRACTURE SURGERY     HARDWARE REMOVAL  03/26/2012   Procedure: HARDWARE REMOVAL;  Surgeon: Sherri Rad, MD;  Location: [facility];  Service: Orthopedics;  Laterality: Left;  hardware removal deep left ankle syndesmotic screws only and stress xrays ankle    ORIF ANKLE FRACTURE  12/03/2011   Procedure: OPEN REDUCTION INTERNAL FIXATION (ORIF) ANKLE FRACTURE;  Surgeon: Sherri Rad, MD;  Location: [facility];  Service: Orthopedics;  Laterality: Left;  left medial malleolus fracture   PILONIDAL CYST EXCISION  ~1982   SKIN GRAFT  ~1970   Lt great toe (graft from Lt thigh)   Patient Active Problem List   Diagnosis Date Noted   HTN (hypertension) 07/22/2019   Acute metabolic encephalopathy 07/22/2019   AKI (acute kidney injury) (HCC) 07/22/2019   Sepsis (HCC) 07/22/2019   UTI (urinary tract infection) 07/22/2019   Diabetes mellitus without complication (HCC) 09/10/2012   CHEST PAIN  07/21/2009    ONSET DATE: 12/01/2022  REFERRING DIAG: G20.A1 (ICD-10-CM) - Parkinson's disease without dyskinesia, unspecified whether manifestations fluctuate   THERAPY DIAG:  Other lack of coordination  Muscle weakness (generalized)  Other symptoms and signs involving the nervous system  Unsteadiness on feet  Abnormal posture  Rationale for Evaluation and Treatment: Rehabilitation  SUBJECTIVE:   SUBJECTIVE STATEMENT:  I tried your method for the jacket and it worked Sunday but hasn't worked since  Pt accompanied by: significant other - [name]  PERTINENT HISTORY:   Per PT eval, " PMH: HTN, Diabetes, HLD    Pt saw neurologist Dr. Teresa Coombs on 12/01/22 for memory impairment, per Dr. 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 Pt started on Sinemet.  Noticed a difference in his walking about 4-6 weeks ago. Was started on Sinemet and started taking it (1/31). Has been taking small, shuffled steps. Notices that he will lean forward at times. Getting up from a chair is getting more difficult. Has had 2 falls - one he was getting up and his lost balance, 2nd fall was down the stairs and missed a step."    PRECAUTIONS: Fall  WEIGHT BEARING RESTRICTIONS: No  PAIN:  Are you having  pain? No  FALLS: Has patient fallen in last 6 months? Yes. Number of falls 3  LIVING ENVIRONMENT: Lives with: lives with their spouse Lives in: House/apartment Stairs: Yes: Internal: 10 steps; on right going up and External: 4 steps; on right going up Has following equipment at home: None   PLOF: Independent   PATIENT GOALS: Wants to get back to normal.   OBJECTIVE:   HAND DOMINANCE: Right  ADLs: Transfers/ambulation related to ADLs: Eating: mod I Grooming: I UB Dressing: min A LB Dressing: mod A Toileting: min A with buttoning pants Bathing: Distance supervision  Tub Shower  transfers: distance supervision Equipment: Administrator HHSH  IADLs: Shopping: same as PLOF Light housekeeping: mod I Meal Prep: light meal prep, has only been cooking breakfast Community mobility: driving, short distance, no highway Medication management: min A Financial management: I Handwriting: 25% legible  MOBILITY STATUS: Needs Assist: SBA  ACTIVITY TOLERANCE: Activity tolerance: good  FUNCTIONAL OUTCOME MEASURES: Fastening/unfastening 3 buttons: 1:55  Physical performance test: PPT#2 (simulated eating) 27 seconds R = 1 & PPT#4 (donning/doffing jacket): 27 seconds = 1  02/20/23: fastening/unfastening 3 buttons: 47 seconds seated, and 42 seconds in standing    PPT #2: right hand 12 seconds PPT#4: 36 seconds with hospital gown. Pt did not recall to bring personal jacket  02/25/23 PPT#4: 24 & 31 seconds with personal jacket  COORDINATION: 9 Hole Peg test: Right: 55 sec; Left: 42 sec Box and Blocks:  Right 26 blocks, Left 31blocks  02/20/23: 9 hole peg test: Right 34 seconds, Left 32 seconds  Box and Block: Right 29, Left 35   Grip strength: 73.4 lbs R ; 74.5 lbs L  UE ROM:  WFL  UE MMT:   WFL  SENSATION: WFL  MUSCLE TONE: RUE: Moderate more prominent in shoulder and LUE: Mild more prominent in elbow  COGNITION: Overall cognitive status: Impaired - impaired motor planning, processing,  OBSERVATIONS: Bradykinesia and Pill rolling L hand;  Difficulty following commands  TODAY'S TREATMENT:         -Reviewed slight changes in community resources  -Reviewed strategies for donning/doffing jacket and provided handout (see pt instructions). Pt instructed to practice daily - even in warmer weather.  Practiced handwriting - pt able to maintain legibility and size with letters called aloud.  Discussed voice volume and had pt shout name and sentence for increased voice volume and articulation                                                                                       PATIENT EDUCATION: Education details: PD community resources; keeping thinking skills sharp; upcoming d/c; PD screening Person educated: Patient and Spouse Education method: Explanation, reviewed handout, verbal cues, demonstration Education comprehension: verbalized understanding, and needs further education  HOME EXERCISE PROGRAM: 3/13: PWR UP and TWIST in sitting and standing 01/21/23: reviewed and issued handwriting strategies (from PD website)  3/25: bag HEP 02/18/23: performing daily activities with big movements, hip stretch for LE dressing 02/25/23 - reviewed Coordination HEP from 12/22/22  GOALS:  SHORT TERM GOALS: Target date: 01/05/2023    Pt will be independent with PD specific HEP.  Baseline:  not yet initiated Goal status: MET w/ cues (wife instructed in how to cue him)  2.  Pt will verbalize understanding of adapted strategies to maximize safety and independence with ADLs/IADLs.  Baseline: not yet initiated Goal status: MET  3.  Pt will write a sentence with no significant decrease in size and maintain 75% legibility.  Baseline: 25% legibility Goal status: MET - pt meeting this goal w/ strategies and calling out letters (limited by apraxia and ? Some aphasia)   4.  Pt will demonstrate improved ease with fastening buttons as evidenced by decreasing 3 button/unbutton time by 5 seconds -  Baseline: 1 minute 55 seconds 02/20/23: 47 seconds with pt personal shirt Goal status: MET    LONG TERM GOALS: Target date: 03/06/2023    1. Pt will verbalize understanding of ways to prevent future PD related complications and PD community resources.  Baseline: not yet initiated Goal status: MET  2.  Pt will write a short paragraph with no significant decrease in size and maintain 100% legibility.  Baseline: 25% legibility Goal status: NOT MET d/t apraxia  3.  Pt will verbalize understanding of ways to keep thinking skills sharp and ways to compensate for STM changes in  the future.  Baseline: not yet initiated Goal status: MET  4.  Pt will demonstrate improved ease with feeding as evidenced by decreasing PPT#2 by 3 secs.  Baseline: 27 secs 02/20/23: 12 seconds Goal status: MET  5.  Pt will demonstrate increased ease with dressing as evidenced by decreasing PPT#4 (don/ doff jacket) to 24 secs or less.  Baseline: 27 secs 02/20/23: 36 seconds Goal status: NOT MET (03/04/23: 41 sec with cues but no assist)  6.  Pt will demonstrate improved fine motor coordination for ADLs as evidenced by decreasing 9 hole peg test score for each hand by 5 secs  Baseline: Right: 55 sec; Left: 42 sec  02/20/23: Right 34, Left 32 Goal status: MET  7.  Pt will demonstrate improved ease with fastening buttons as evidenced by decreasing 3 button/unbutton time by 10 seconds  Baseline: 1 minute 55 seconds seconds 02/20/23: 47 seconds seated Goal status: MET  8.  Pt will be able to place at least 29 blocks using R hand with completion of Box and Blocks test.  Baseline: 26 blocks 02/20/23: Right hand 29 blocks Goal status: MET    ASSESSMENT:  CLINICAL IMPRESSION: Pt met all STG's and 6/8 LTG's. 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   COMORBIDITIES:  has co-morbidities such as HTN, HLD, DM  that affects occupational performance. Patient will benefit from skilled OT to address above impairments and improve overall function.  OT OCCUPATIONAL PROFILE AND HISTORY: Detailed assessment: Review of records and additional review of physical, cognitive, psychosocial history related to current functional performance.  REHAB POTENTIAL: Fair  given PD diagnosis   PLAN:  OT FREQUENCY: 2x/week  OT DURATION: 12 weeks  PLANNED INTERVENTIONS: self care/ADL training, therapeutic exercise, therapeutic activity, neuromuscular re-education, manual therapy, functional mobility training, ultrasound, paraffin, fluidotherapy, moist heat, patient/family education, cognitive remediation/compensation, visual/perceptual remediation/compensation, energy conservation, coping strategies training, DME and/or AE instructions, and Re-evaluation  RECOMMENDED OTHER SERVICES: none at this time  CONSULTED AND AGREED WITH PLAN OF CARE: Patient and  family member/caregiver  PLAN: d/c O.T.   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 Patient goals were partially met. Patient is being discharged due to maximized rehab potential. .     Sheran Lawless, OT 03/04/2023, 1:55 PM

## 2023-03-05 ENCOUNTER — Encounter: Payer: Self-pay | Admitting: Neurology

## 2023-03-05 ENCOUNTER — Ambulatory Visit: Payer: Self-pay | Admitting: Neurology

## 2023-03-05 VITALS — BP 117/72 | Ht 73.0 in | Wt 253.0 lb

## 2023-03-05 DIAGNOSIS — R4189 Other symptoms and signs involving cognitive functions and awareness: Secondary | ICD-10-CM

## 2023-03-05 DIAGNOSIS — G20A1 Parkinson's disease without dyskinesia, without mention of fluctuations: Secondary | ICD-10-CM

## 2023-03-05 MED ORDER — CARBIDOPA-LEVODOPA 25-100 MG PO TABS: 1.0000 | ORAL_TABLET | Freq: Four times a day (QID) | ORAL | 3 refills | Status: DC

## 2023-03-05 NOTE — Progress Notes (Signed)
[facility]  PATIENT: Edward Briggs DOB: 18-Mar-1951  REFERRING CLINICIAN: [name], MD HISTORY FROM: From patient and wife Edward Briggs  REASON FOR VISIT: Memory problems    HISTORICAL  CHIEF COMPLAINT:  Chief Complaint  Patient presents with   Follow-up    Rm 13, with wife Mrs. Potts, follow up Parkinsons, no new symptoms or changes.    INTERVAL HISTORY 03/05/2023:  Patient presents today for follow-up, he is accompanied by wife.  Last visit was in January.  At that time, he was diagnosed with parkinsonism/Parkinson disease and patient was referred to physical therapy.  We have also started him on Sinemet 25/100 3 times daily.  Wife reports there is some improvement with the Sinemet and physical therapy but is not a huge improvement.  She feels that his gait is better, no recent falls.  Overall he is doing better in general    INTERVAL HISTORY 12/01/2022:  Patient presents today for follow-up, he is accompanied by wife. Since last visit, they reported he is stable.  He does have some good days and and some bad days.  Wife reports that sometimes he is forgetful but overall he is doing well, still independent.  He is compliant with the Aricept, denies any side effect from the medicine.  Wife reported patient does not exercise.  They deny any fall, and no REM sleep behavior.   HISTORY OF PRESENT ILLNESS:  This is a 72 year old gentleman with past medical history of diabetes, hypertension, hyperlipidemia who is presenting for memory problem.  Patient states that "He does not feel too bad about his memory".  He reports that he feels a little more forgetful lately.  He reported he is still independent, able to carry activities of daily living but still a little forgetful.  Wife reports that patient does not usually remember what they have been talking about.  He has asked her to repeat the same thing over and over. Wife reported memory problem has been going on  for the past 6 months and it is getting worse.  Most of the issues are related to missing objects. He has missed his wallet, misplaced his phone and does not remember previous conversations.  He is able to cook, clean, bath and dress himself.  He has not had any recent accident or get lost in familiar places.  He continues to drive but for only for short distance.  For finances, they do pay the bills together;  wife reports that in the past he had missed some payments but since they have been working together to pay the bills, they have not had any problems with missing payment. 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  He had recent lab work: His TSH is 1.64, B12 224, his previous HbA1c is 8.6.  His last brain imaging was a CT of the head done in September 2020 and showed some cerebral atrophy some small vessel white matter disease but no acute intracranial pathology.     REVIEW OF SYSTEMS: Full 14 system review of systems performed and negative with exception of: as noted in the HPI  ALLERGIES: No Known Allergies  HOME MEDICATIONS: Outpatient Medications Prior to Visit  Medication Sig Dispense Refill   Accu-Chek Softclix Lancets lancets USE AS DIRECTED UP TO 4 TIMES DAILY 100 each 0   aspirin 81 MG tablet Take 81 mg by mouth daily.     blood  glucose meter kit and supplies Dispense based on patient and insurance preference. Use up to four times daily as directed. (FOR ICD-10 E10.9, E11.9). 1 each 0   Blood Glucose Monitoring Suppl (BLOOD GLUCOSE METER) kit Use as instructed 1 each 0   donepezil (ARICEPT) 10 MG tablet Take 1 tablet (10 mg total) by mouth at bedtime. 90 tablet 3   glucose blood test strip Check blood sugar 3 times daily 100 each 4   Insulin Pen Needle (BD PEN NEEDLE NANO U/F) 32G X 4 MM MISC USE DAILY AS DIRECTED 100 each 2   LANTUS SOLOSTAR 100 UNIT/ML Solostar Pen INJECT 30 UNITS SUBCUTANEOUSLY ONCE DAILY 36 mL 0    lisinopril (ZESTRIL) 5 MG tablet Take 1 tablet (5 mg total) by mouth daily. 90 tablet 1   metFORMIN (GLUCOPHAGE) 1000 MG tablet Take 1 tablet (1,000 mg total) by mouth 2 (two) times daily with a meal. 180 tablet 3   carbidopa-levodopa (SINEMET IR) 25-100 MG tablet Take 1 tablet by mouth 3 (three) times daily. 90 tablet 4   acetaminophen-codeine (TYLENOL #3) 300-30 MG tablet Take 1 tablet by mouth every 4 (four) hours as needed. (Patient not taking: Reported on 03/05/2023)     Ascorbic Acid (VITAMIN C PO) Take 1 tablet by mouth daily.  (Patient not taking: Reported on 03/05/2023)     atorvastatin (LIPITOR) 10 MG tablet Take 1 tablet (10 mg total) by mouth daily. (Patient not taking: Reported on 03/05/2023) 90 tablet 1   No facility-administered medications prior to visit.    PAST MEDICAL HISTORY: 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    PAST SURGICAL HISTORY: Past Surgical History:  Procedure Laterality Date   ABSCESS DRAINAGE     COLONOSCOPY     ~ 10 yrs ago   FRACTURE SURGERY     HARDWARE REMOVAL  03/26/2012   Procedure: HARDWARE REMOVAL;  Surgeon: Sherri Rad, MD;  Location: [facility];  Service: Orthopedics;  Laterality: Left;  hardware removal deep left ankle syndesmotic screws only and stress xrays ankle    ORIF ANKLE FRACTURE  12/03/2011   Procedure: OPEN REDUCTION INTERNAL FIXATION (ORIF) ANKLE FRACTURE;  Surgeon: Sherri Rad, MD;  Location: [facility];  Service: Orthopedics;  Laterality: Left;  left medial malleolus fracture   PILONIDAL CYST EXCISION  ~1982   SKIN GRAFT  ~1970   Lt great toe (graft from Lt thigh)    FAMILY HISTORY: Family History  Problem Relation Age of Onset   Cancer Mother    Cancer Father    Colon cancer Neg Hx    Colon polyps Neg Hx    Esophageal  cancer Neg Hx    Rectal cancer Neg Hx    Stomach cancer Neg Hx     SOCIAL HISTORY: Social History   Socioeconomic History   Marital status: Married    Spouse name: Not on file   Number of children: 2   Years of education: Not on file   Highest education level: Not on file  Occupational History   Occupation: retired  Tobacco Use   Smoking status: Former    Types: Cigarettes    Quit date: 10/30/1988    Years since quitting: 34.3   Smokeless tobacco: Never  Vaping Use  Vaping Use: Never used  Substance and Sexual Activity   Alcohol use: Yes    Alcohol/week: 1.0 standard drink of alcohol    Types: 1 Shots of liquor per week    Comment: every two weeks   Drug use: No   Sexual activity: Yes    Birth control/protection: None  Other Topics Concern   Not on file  Social History Narrative   Not on file   Social Determinants of Health   Financial Resource Strain: Low Risk  (03/19/2022)   Overall Financial Resource Strain (CARDIA)    Difficulty of Paying Living Expenses: Not hard at all  Food Insecurity: No Food Insecurity (03/19/2022)   Hunger Vital Sign    Worried About Running Out of Food in the Last Year: Never true    Ran Out of Food in the Last Year: Never true  Transportation Needs: No Transportation Needs (03/19/2022)   PRAPARE - Administrator, Civil Service (Medical): No    Lack of Transportation (Non-Medical): No  Physical Activity: Sufficiently Active (03/19/2022)   Exercise Vital Sign    Days of Exercise per Week: 3 days    Minutes of Exercise per Session: 60 min  Stress: No Stress Concern Present (03/19/2022)   [employer] of Occupational Health - Occupational Stress Questionnaire    Feeling of Stress : Not at all  Social Connections: Socially Integrated (03/19/2022)   Social Connection and Isolation Panel [NHANES]    Frequency of Communication with Friends and Family: Three times a week    Frequency of Social Gatherings with Friends and  Family: Three times a week    Attends Religious Services: More than 4 times per year    Active Member of Clubs or Organizations: Yes    Attends Banker Meetings: More than 4 times per year    Marital Status: Married  Catering manager Violence: Not At Risk (03/19/2022)   Humiliation, Afraid, Rape, and Kick questionnaire    Fear of Current or Ex-Partner: No    Emotionally Abused: No    Physically Abused: No    Sexually Abused: No     PHYSICAL EXAM  GENERAL EXAM/CONSTITUTIONAL: Vitals:  Vitals:   03/05/23 1056  BP: 117/72  Weight: 253 lb (114.8 kg)  Height: 6\' 1"  (1.854 m)    Body mass index is 33.38 kg/m. Wt Readings from Last 3 Encounters:  03/05/23 253 lb (114.8 kg)  12/01/22 262 lb (118.8 kg)  07/03/22 252 lb 3.2 oz (114.4 kg)   Patient is in no distress; well developed, nourished and groomed; neck is supple.  EYES: Visual fields full to confrontation, Extraocular movements intacts,  No results found.  MUSCULOSKELETAL: Gait, strength, tone, movements noted in Neurologic exam below  NEUROLOGIC: MENTAL STATUS:     05/29/2021    2:57 PM  MMSE - Mini Mental State Exam  Orientation to time 4  Orientation to Place 4  Registration 3  Attention/ Calculation 2  Recall 2  Language- name 2 objects 2  Language- repeat 1  Language- follow 3 step command 2  Language- follow 3 step command-comments handed paper back to me  Language- read & follow direction 0  Language-read & follow direction-comments did not close eyes  Write a sentence 1  Copy design 0  Total score 21    CRANIAL NERVE:  2nd, 3rd, 4th, 6th -visual fields full to confrontation, extraocular muscles intact, no nystagmus 5th - facial sensation symmetric 7th - decrease facial expression (masked facies)  8th - hearing intact 9th - palate elevates symmetrically, uvula midline 11th - shoulder shrug symmetric 12th - tongue protrusion midline Glabellar reflex present   MOTOR:  normal bulk.  Bradykinesia is also present  SENSORY:  normal and symmetric to light touch,  COORDINATION:  finger-nose-finger, fine finger movements normal. No resting or action tremors noted    GAIT/STATION:  Difficulty getting up with arms crossed, but was able to do so after multiple attempts. Shuffling gait, decrease arm swing, turn in block.     DIAGNOSTIC DATA (LABS, IMAGING, TESTING) - I reviewed patient records, labs, notes, testing and imaging myself where available.  Lab Results  Component Value Date   WBC 8.2 07/25/2019   HGB 12.2 (L) 07/25/2019   HCT 36.4 (L) 07/25/2019   MCV 94.3 07/25/2019   PLT 213 07/25/2019      Component Value Date/Time   NA 135 07/03/2022 1031   NA 138 01/19/2020 1118   K 4.4 07/03/2022 1031   CL 100 07/03/2022 1031   CO2 27 07/03/2022 1031   GLUCOSE 262 (H) 07/03/2022 1031   BUN 13 07/03/2022 1031   BUN 11 01/19/2020 1118   CREATININE 0.96 07/03/2022 1031   CREATININE 0.86 08/20/2015 0924   CALCIUM 9.3 07/03/2022 1031   PROT 7.6 07/03/2022 1031   PROT 7.3 01/19/2020 1118   ALBUMIN 4.1 07/03/2022 1031   ALBUMIN 4.3 01/19/2020 1118   AST 14 07/03/2022 1031   ALT 15 07/03/2022 1031   ALKPHOS 56 07/03/2022 1031   BILITOT 0.6 07/03/2022 1031   BILITOT 0.6 01/19/2020 1118   GFRNONAA 78 01/19/2020 1118   GFRNONAA >89 08/20/2015 0924   GFRAA 90 01/19/2020 1118   GFRAA >89 08/20/2015 0924   Lab Results  Component Value Date   CHOL 141 07/03/2022   HDL 41.50 07/03/2022   LDLCALC 77 07/03/2022   TRIG 114.0 07/03/2022   CHOLHDL 3 07/03/2022   Lab Results  Component Value Date   HGBA1C 8.1 (H) 07/03/2022   Lab Results  Component Value Date   VITAMINB12 324 04/24/2021   Lab Results  Component Value Date   TSH 1.64 04/24/2021    CT Head 07/2019: Cerebral atrophy, no acute intracranial pathology.    Routine EEG 07/04/2021 This is an abnormal EEG recording in the waking and sleeping state due to diffuse generalized slowing. No  interictal epileptiform discharges were seen at any time during the recording.  Diffuse slowing is consistent with non specific generalized brain dysfunction such as encephalopathy. No seizures captured.    ASSESSMENT AND PLAN  73 y.o. year old male here for follow up for his mild cognitive impairment and parkinsonism/Parkinson disease. There is some improvement with Sinemet and physical therapy.  Actually he graduated from physical therapy yesterday.  Plan will be to increase Sinemet to 4 times daily.  I will also obtain a DaTscan to confirm the diagnosis of Parkinson disease.  I will see him in 1 year for follow-up or sooner if worse.   1. Parkinson's disease without dyskinesia, unspecified whether manifestations fluctuate   2. Cognitive impairment     Patient Instructions  Please increase Sinemet to 4 times daily at 10AM, 1PM, 5PM and 8 PM Will obtain a DatScan to confirm the diagnosis  Continue your other medications  Follow up in a year or sooner if worse     Orders Placed This Encounter  Procedures   NM BRAIN DATSCAN TUMOR LOC INFLAM SPECT 1 DAY    Meds ordered this  encounter  Medications   carbidopa-levodopa (SINEMET IR) 25-100 MG tablet    Sig: Take 1 tablet by mouth 4 (four) times daily.    Dispense:  360 tablet    Refill:  3   --------------------------------------------------------------------------------------------  There are well-accepted and sensible ways to reduce risk for Alzheimers disease and other degenerative brain disorders .  Exercise Daily Walk A daily 20 minute walk should be part of your routine. Disease related apathy can be a significant roadblock to exercise and the only way to overcome this is to make it a daily routine and perhaps have a reward at the end (something your loved one loves to eat or drink perhaps) or a personal trainer coming to the home can also be very useful. Most importantly, the patient is much more likely to exercise if the  caregiver / spouse does it with him/her. In general a structured, repetitive schedule is best.  General Health: Any diseases which effect your body will effect your brain such as a pneumonia, urinary infection, blood clot, heart attack or stroke. Keep contact with your primary care doctor for regular follow ups.  Sleep. A good nights sleep is healthy for the brain. Seven hours is recommended. If you have insomnia or poor sleep habits we can give you some instructions. If you have sleep apnea wear your mask.  Diet: Eating a heart healthy diet is also a good idea; fish and poultry instead of red meat, nuts (mostly non-peanuts), vegetables, fruits, olive oil or canola oil (instead of butter), minimal salt (use other spices to flavor foods), whole grain rice, bread, cereal and pasta and wine in moderation.Research is now showing that the MIND diet, which is a combination of The Mediterranean diet and the DASH diet, is beneficial for cognitive processing and longevity. Information about this diet can be found in The MIND Diet, a book by Alonna Minium, MS, RDN, and online at [employer].es  Finances, Power of [address] and Advance Directives: You should consider putting legal safeguards in place with regard to financial and medical decision making. While the spouse always has power of attorney for medical and financial issues in the absence of any form, you should consider what you want in case the spouse / caregiver is no longer around or capable of making decisions.    Return in about 1 year (around 03/04/2024).   Windell Norfolk, MD 03/05/2023, 11:37 AM  [facility] [address], [address] [place], [place] [address] [phone]

## 2023-03-05 NOTE — Patient Instructions (Signed)
Please increase Sinemet to 4 times daily at 10AM, 1PM, 5PM and 8 PM Will obtain a DatScan to confirm the diagnosis  Continue your other medications  Follow up in a year or sooner if worse

## 2023-03-09 ENCOUNTER — Telehealth: Payer: Self-pay | Admitting: Family Medicine

## 2023-03-09 NOTE — Telephone Encounter (Signed)
[facility] Update placed in front bin.

## 2023-03-10 ENCOUNTER — Telehealth: Payer: Self-pay | Admitting: Neurology

## 2023-03-10 NOTE — Telephone Encounter (Signed)
Forms picked up and placed in your folder.

## 2023-03-10 NOTE — Telephone Encounter (Signed)
[employer] medicare Berkley Harvey: [id number] exp. 03/10/23-04/24/23 sent to [facility] nuclear medicine [phone]

## 2023-03-13 ENCOUNTER — Encounter: Payer: Self-pay | Admitting: Family Medicine

## 2023-03-13 ENCOUNTER — Ambulatory Visit (INDEPENDENT_AMBULATORY_CARE_PROVIDER_SITE_OTHER): Payer: Medicare Other | Admitting: Family Medicine

## 2023-03-13 VITALS — BP 128/72 | HR 76 | Temp 98.9°F | Ht 73.0 in | Wt 254.8 lb

## 2023-03-13 DIAGNOSIS — E1165 Type 2 diabetes mellitus with hyperglycemia: Secondary | ICD-10-CM | POA: Diagnosis not present

## 2023-03-13 DIAGNOSIS — G20C Parkinsonism, unspecified: Secondary | ICD-10-CM | POA: Diagnosis not present

## 2023-03-13 DIAGNOSIS — R1310 Dysphagia, unspecified: Secondary | ICD-10-CM | POA: Diagnosis not present

## 2023-03-13 DIAGNOSIS — Z794 Long term (current) use of insulin: Secondary | ICD-10-CM

## 2023-03-13 DIAGNOSIS — E785 Hyperlipidemia, unspecified: Secondary | ICD-10-CM

## 2023-03-13 DIAGNOSIS — Z9189 Other specified personal risk factors, not elsewhere classified: Secondary | ICD-10-CM

## 2023-03-13 DIAGNOSIS — E1169 Type 2 diabetes mellitus with other specified complication: Secondary | ICD-10-CM

## 2023-03-13 DIAGNOSIS — I10 Essential (primary) hypertension: Secondary | ICD-10-CM | POA: Diagnosis not present

## 2023-03-13 LAB — COMPREHENSIVE METABOLIC PANEL
ALT: 13 U/L (ref 0–53)
AST: 15 U/L (ref 0–37)
Albumin: 4 g/dL (ref 3.5–5.2)
Alkaline Phosphatase: 60 U/L (ref 39–117)
BUN: 11 mg/dL (ref 6–23)
CO2: 29 mEq/L (ref 19–32)
Calcium: 9.2 mg/dL (ref 8.4–10.5)
Chloride: 102 mEq/L (ref 96–112)
Creatinine, Ser: 0.79 mg/dL (ref 0.40–1.50)
GFR: 89.46 mL/min (ref >60.00)
Glucose, Bld: 154 mg/dL — ABNORMAL HIGH (ref 70–99)
Potassium: 4.6 mEq/L (ref 3.5–5.1)
Sodium: 138 mEq/L (ref 135–145)
Total Bilirubin: 0.7 mg/dL (ref 0.2–1.2)
Total Protein: 7.4 g/dL (ref 6.0–8.3)

## 2023-03-13 LAB — LIPID PANEL
Cholesterol: 134 mg/dL (ref 0–200)
HDL: 45.3 mg/dL (ref >39.00)
LDL Cholesterol: 69 mg/dL (ref 0–99)
NonHDL: 88.29
Total CHOL/HDL Ratio: 3
Triglycerides: 94 mg/dL (ref 0.0–149.0)
VLDL: 18.8 mg/dL (ref 0.0–40.0)

## 2023-03-13 LAB — MICROALBUMIN / CREATININE URINE RATIO
Creatinine,U: 81.4 mg/dL
Microalb Creat Ratio: 0.9 mg/g (ref 0.0–30.0)
Microalb, Ur: <0.7 mg/dL (ref 0.0–1.9)

## 2023-03-13 LAB — HEMOGLOBIN A1C: Hgb A1c MFr Bld: 7.3 % — ABNORMAL HIGH (ref 4.6–6.5)

## 2023-03-13 MED ORDER — ATORVASTATIN CALCIUM 10 MG PO TABS: 10.0000 mg | ORAL_TABLET | Freq: Every day | ORAL | 1 refills | Status: DC

## 2023-03-13 MED ORDER — LANTUS SOLOSTAR 100 UNIT/ML ~~LOC~~ SOPN: PEN_INJECTOR | SUBCUTANEOUS | 5 refills | Status: DC

## 2023-03-13 MED ORDER — METFORMIN HCL 500 MG PO TABS: 1000.0000 mg | ORAL_TABLET | Freq: Two times a day (BID) | ORAL | 3 refills | Status: DC

## 2023-03-13 NOTE — Progress Notes (Unsigned)
Subjective:  Patient ID: Edward Briggs, male    DOB: 1951/03/21  Age: 72 y.o. MRN: [id number]  CC:  Chief Complaint  Patient presents with   Medication Problem    Pt states when he is swallowing medication the are getting stuck in throat     HPI Edward Briggs presents for   Diabetes: Insulin-dependent, complicated by hyperglycemia, last visit in August 2023.  At that time on metformin 1000 mg twice daily, Lantus 34 units/day.  Uncontrolled by A1c at that time.  Recommended increasing Lantus to 36 units and recheck within 3 months.  I have not seen him since August of last year.  Denies barriers to care. He is on statin with Lipitor 10 mg daily.  ACE inhibitor with lisinopril 5 mg daily. Taking lantus, but only 34 units - did not know he needed to increase to 36 units.  Home readings 120-140.  No sx lows.  No 200's.  Microalbumin: Normal ratio 03/28/2021. Optho, foot exam, pneumovax: UTD.   Lab Results  Component Value Date   HGBA1C 8.1 (H) 07/03/2022   HGBA1C 7.8 (A) 04/02/2022   HGBA1C 8.3 (A) 01/02/2022   Lab Results  Component Value Date   MICROALBUR <0.7 03/28/2021   LDLCALC 77 07/03/2022   CREATININE 0.96 07/03/2022    Dysphagia Recently has noticed some difficulty with swallowing pills.  Has noticed trouble swallowing in past month. Now taking one at a time. Metformin is biggest pill.  Able to drink liquids and food ok. No pain with swallowing.   Pills seem to get stuck, more difficult to swallow. No regurgitation of pill products or food.  No heartburn.  Unfortunately he was diagnosed in January with parkinsonism/Parkinson's disease and referred to physical therapy, started on Sinemet.  Last visit May 2 with neuro, some improvement with Sinemet.  Dose was increased to 4 times daily and DaTscan ordered to confirm diagnosis.  Neuro, Dr. Cynda Familia at [facility] neuro. No new side effects with higher dose. On aricept as well.  Did have SLP initial evaluation by speech  pathology on April 3.  No difficulty swallowing noted at that visit. No acute facial droop or focal weakness.         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   Past Surgical History:  Procedure Laterality Date   ABSCESS DRAINAGE     COLONOSCOPY     ~ 10 yrs ago   FRACTURE SURGERY     HARDWARE REMOVAL  03/26/2012   Procedure: HARDWARE REMOVAL;  Surgeon: Sherri Rad, MD;  Location: [facility];  Service: Orthopedics;  Laterality: Left;  hardware removal deep left ankle syndesmotic screws only and stress xrays ankle    ORIF ANKLE FRACTURE  12/03/2011   Procedure: OPEN REDUCTION INTERNAL FIXATION (ORIF) ANKLE FRACTURE;  Surgeon: Sherri Rad, MD;  Location: [facility];  Service: Orthopedics;  Laterality: Left;  left medial malleolus fracture   PILONIDAL  CYST EXCISION  ~1982   SKIN GRAFT  ~1970   Lt great toe (graft from Lt thigh)   No Known Allergies Prior to Admission medications   Medication Sig Start Date End Date Taking? Authorizing Provider  Accu-Chek Softclix Lancets lancets USE AS DIRECTED UP TO 4 TIMES DAILY 01/22/22  Yes [name], MD  atorvastatin (LIPITOR) 10 MG tablet Take 1 tablet (10 mg total) by mouth daily. 07/03/22  Yes [name], MD  blood glucose meter kit and supplies Dispense based on patient and insurance preference. Use up to four times daily as directed. (FOR ICD-10 E10.9, E11.9). 11/25/21  Yes [name],  MD  Blood Glucose Monitoring Suppl (BLOOD GLUCOSE METER) kit Use as instructed 09/10/12  Yes Peyton Najjar, MD  carbidopa-levodopa (SINEMET IR) 25-100 MG tablet Take 1 tablet by mouth 4 (four) times daily. 03/05/23 02/28/24 Yes Camara, Amalia Hailey, MD  donepezil (ARICEPT) 10 MG tablet Take 1 tablet (10 mg total) by mouth at bedtime. 12/01/22 11/26/23 Yes Camara, Amalia Hailey, MD  glucose blood test strip Check blood sugar 3 times daily 11/25/21  Yes [name], MD  Insulin Pen Needle (BD PEN NEEDLE NANO U/F) 32G X 4 MM MISC USE DAILY AS DIRECTED 03/28/21  Yes [name], MD  LANTUS SOLOSTAR 100 UNIT/ML Solostar Pen INJECT 30 UNITS SUBCUTANEOUSLY ONCE DAILY 05/09/22  Yes [name], MD  lisinopril (ZESTRIL) 5 MG tablet Take 1 tablet (5 mg total) by mouth daily. 01/16/23  Yes [name], MD  metFORMIN (GLUCOPHAGE) 1000 MG tablet Take 1 tablet (1,000 mg total) by mouth 2 (two) times daily with a meal. 04/02/22  Yes [name], MD  acetaminophen-codeine (TYLENOL #3) 300-30 MG tablet Take 1 tablet by mouth every 4 (four) hours as needed. Patient not taking: Reported on 03/13/2023 03/13/22   [provider]  Ascorbic Acid (VITAMIN C PO) Take 1 tablet by mouth daily.  Patient not taking: Reported on 03/13/2023    [provider]  aspirin 81 MG tablet Take 81 mg by mouth daily. Patient not taking: Reported on 03/13/2023    [provider]   Social History   Socioeconomic History   Marital status: Married    Spouse name: Not on file   Number of children: 2   Years of education: Not on file   Highest education level: Not on file  Occupational History   Occupation: retired  Tobacco Use   Smoking status: Former    Types: Cigarettes    Quit date: 10/30/1988    Years since quitting: 34.3   Smokeless tobacco: Never  Vaping Use   Vaping Use: Never used  Substance and Sexual Activity   Alcohol use: Yes    Alcohol/week: 1.0 standard drink of alcohol    Types: 1  Shots of liquor per week    Comment: every two weeks   Drug use: No   Sexual activity: Yes    Birth control/protection: None  Other Topics Concern   Not on file  Social History Narrative   Not on file   Social Determinants of Health   Financial Resource Strain: Low Risk  (03/19/2022)   Overall Financial Resource Strain (CARDIA)    Difficulty of Paying Living Expenses: Not hard at all  Food Insecurity: No Food Insecurity (03/19/2022)   Hunger Vital Sign    Worried About Running Out of Food in the Last Year: Never true    Ran Out of Food in the  Last Year: Never true  Transportation Needs: No Transportation Needs (03/19/2022)   PRAPARE - Administrator, Civil Service (Medical): No    Lack of Transportation (Non-Medical): No  Physical Activity: Sufficiently Active (03/19/2022)   Exercise Vital Sign    Days of Exercise per Week: 3 days    Minutes of Exercise per Session: 60 min  Stress: No Stress Concern Present (03/19/2022)   [employer] of Occupational Health - Occupational Stress Questionnaire    Feeling of Stress : Not at all  Social Connections: Socially Integrated (03/19/2022)   Social Connection and Isolation Panel [NHANES]    Frequency of Communication with Friends and Family: Three times a week    Frequency of Social Gatherings with Friends and Family: Three times a week    Attends Religious Services: More than 4 times per year    Active Member of Clubs or Organizations: Yes    Attends Banker Meetings: More than 4 times per year    Marital Status: Married  Catering manager Violence: Not At Risk (03/19/2022)   Humiliation, Afraid, Rape, and Kick questionnaire    Fear of Current or Ex-Partner: No    Emotionally Abused: No    Physically Abused: No    Sexually Abused: No    Review of Systems  Per HPI.  Objective:   Vitals:   03/13/23 0826  BP: 128/72  Pulse: 76  Temp: 98.9 F (37.2 C)  TempSrc: Temporal  SpO2: 99%  Weight: 254 lb 12.8  oz (115.6 kg)  Height: 6\' 1"  (1.854 m)     Physical Exam Vitals reviewed.  Constitutional:      Appearance: He is well-developed.  HENT:     Head: Normocephalic and atraumatic.     Mouth/Throat:     Comments: No stridor, clearing secretions. Neck:     Vascular: No carotid bruit or JVD.  Cardiovascular:     Rate and Rhythm: Normal rate and regular rhythm.     Heart sounds: Normal heart sounds. No murmur heard. Pulmonary:     Effort: Pulmonary effort is normal.     Breath sounds: Normal breath sounds. No stridor. No rales.  Musculoskeletal:     Right lower leg: No edema.     Left lower leg: No edema.  Skin:    General: Skin is warm and dry.  Neurological:     Mental Status: He is alert and oriented to person, place, and time.  Psychiatric:        Mood and Affect: Mood normal.        Assessment & Plan:  Edward Briggs is a 72 y.o. male . Type 2 diabetes mellitus with hyperglycemia, with long-term current use of insulin (HCC) - Plan: Microalbumin / creatinine urine ratio, Hemoglobin A1c, metFORMIN (GLUCOPHAGE) 500 MG tablet, atorvastatin (LIPITOR) 10 MG tablet, LANTUS SOLOSTAR 100 UNIT/ML Solostar Pen  -Check updated labs and adjust medication regimen accordingly.  Continue metformin, Lantus same doses for now.  Previously uncontrolled  - typical follow-up intervals for diabetes discussed and denies barriers to care.  Essential hypertension - Plan: Comprehensive metabolic panel  -Stable, no med changes at this time, check updated labs.  Hyperlipidemia associated with type 2 diabetes mellitus (HCC) - Plan: Comprehensive metabolic panel, Lipid panel At increased risk for cardiovascular disease - Plan: atorvastatin (LIPITOR) 10 MG tablet  -Tolerating statin, continue same, check updated labs with plan adjustment accordingly  Parkinsonism, unspecified Parkinsonism type Pill dysphagia  -Unfortunate recent diagnosis of parkinsonism/Parkinson  disease with plan for DATscan  confirmation.  Reports of pill dysphagia no choking or regurgitation.  Tolerating food/fluids.  Primarily with larger pill.  Will decrease metformin to 500 mg, 2 per dose.  Message sent to previous speech therapist to see if order needed for swallowing study.   Meds ordered this encounter  Medications   metFORMIN (GLUCOPHAGE) 500 MG tablet    Sig: Take 2 tablets (1,000 mg total) by mouth 2 (two) times daily with a meal.    Dispense:  360 tablet    Refill:  3   atorvastatin (LIPITOR) 10 MG tablet    Sig: Take 1 tablet (10 mg total) by mouth daily.    Dispense:  90 tablet    Refill:  1   LANTUS SOLOSTAR 100 UNIT/ML Solostar Pen    Sig: INJECT 34 UNITS SUBCUTANEOUSLY ONCE DAILY    Dispense:  15 mL    Refill:  5   Patient Instructions  I will check labs today then adjust meds if necessary. Diabetes visit every 3 months for now.  Once I hear back from the speech pathologist I will let you know on next step.  Will try to have you be seen for swallowing study, and possibly ear nose and throat evaluation.  I did write for the smaller pill of metformin to see if that is helpful.Return to the clinic or go to the nearest emergency room if any of your symptoms worsen or new symptoms occur.         Signed,   Meredith Staggers, MD [facility], [facility] 03/13/23 8:57 AM

## 2023-03-13 NOTE — Patient Instructions (Addendum)
I will check labs today then adjust meds if necessary. Diabetes visit every 3 months for now.  Once I hear back from the speech pathologist I will let you know on next step.  Will try to have you be seen for swallowing study, and possibly ear nose and throat evaluation.  I did write for the smaller pill of metformin to see if that is helpful.Return to the clinic or go to the nearest emergency room if any of your symptoms worsen or new symptoms occur.

## 2023-03-15 ENCOUNTER — Encounter: Payer: Self-pay | Admitting: Family Medicine

## 2023-03-20 ENCOUNTER — Telehealth: Payer: Self-pay

## 2023-03-20 NOTE — Telephone Encounter (Signed)
-----   Message from [name], MD sent at 03/20/2023  1:15 PM EDT ----- Lab letter  Blood sugar elevated at 154 but better than last year's readings.  Other electrolytes looked okay.  25-month blood sugar test improved at 7.3.  Okay to remain on same doses of medications for now unless you start having elevated home blood sugar readings, then go up by 2 units on your insulin.  Cholesterol levels looked okay.  Urine test for protein looked okay.  Please let me know if there are questions.  Dr. Neva Seat

## 2023-03-20 NOTE — Telephone Encounter (Signed)
Pt aware of lab results 

## 2023-03-25 ENCOUNTER — Ambulatory Visit (INDEPENDENT_AMBULATORY_CARE_PROVIDER_SITE_OTHER): Payer: Medicare Other | Admitting: *Deleted

## 2023-03-25 ENCOUNTER — Telehealth: Payer: Self-pay

## 2023-03-25 DIAGNOSIS — Z0000 Encounter for general adult medical examination without abnormal findings: Secondary | ICD-10-CM | POA: Diagnosis not present

## 2023-03-25 NOTE — Telephone Encounter (Signed)
Per pharmacy Atorvastatin requires PA. Likely due to qty limit. Changed to #30/30.  Key: [id number] PA started via [facility]

## 2023-03-25 NOTE — Patient Instructions (Signed)
Edward Briggs , Thank you for taking time to come for your Medicare Wellness Visit. I appreciate your ongoing commitment to your health goals. Please review the following plan we discussed and let me know if I can assist you in the future.   Screening recommendations/referrals: Colonoscopy: up to date Recommended yearly ophthalmology/optometry visit for glaucoma screening and checkup Recommended yearly dental visit for hygiene and checkup  Vaccinations: Influenza vaccine: up to date Pneumococcal vaccine: up to date Tdap vaccine: bring documentation to next visit Shingles vaccine: Education provided    Advanced directives: Education provided     Preventive Care 65 Years and Older, Male Preventive care refers to lifestyle choices and visits with your health care provider that can promote health and wellness. What does preventive care include? A yearly physical exam. This is also called an annual well check. Dental exams once or twice a year. Routine eye exams. Ask your health care provider how often you should have your eyes checked. Personal lifestyle choices, including: Daily care of your teeth and gums. Regular physical activity. Eating a healthy diet. Avoiding tobacco and drug use. Limiting alcohol use. Practicing safe sex. Taking low doses of aspirin every day. Taking vitamin and mineral supplements as recommended by your health care provider. What happens during an annual well check? The services and screenings done by your health care provider during your annual well check will depend on your age, overall health, lifestyle risk factors, and family history of disease. Counseling  Your health care provider may ask you questions about your: Alcohol use. Tobacco use. Drug use. Emotional well-being. Home and relationship well-being. Sexual activity. Eating habits. History of falls. Memory and ability to understand (cognition). Work and work Astronomer. Screening  You may  have the following tests or measurements: Height, weight, and BMI. Blood pressure. Lipid and cholesterol levels. These may be checked every 5 years, or more frequently if you are over 44 years old. Skin check. Lung cancer screening. You may have this screening every year starting at age 22 if you have a 30-pack-year history of smoking and currently smoke or have quit within the past 15 years. Fecal occult blood test (FOBT) of the stool. You may have this test every year starting at age 80. Flexible sigmoidoscopy or colonoscopy. You may have a sigmoidoscopy every 5 years or a colonoscopy every 10 years starting at age 51. Prostate cancer screening. Recommendations will vary depending on your family history and other risks. Hepatitis C blood test. Hepatitis B blood test. Sexually transmitted disease (STD) testing. Diabetes screening. This is done by checking your blood sugar (glucose) after you have not eaten for a while (fasting). You may have this done every 1-3 years. Abdominal aortic aneurysm (AAA) screening. You may need this if you are a current or former smoker. Osteoporosis. You may be screened starting at age 47 if you are at high risk. Talk with your health care provider about your test results, treatment options, and if necessary, the need for more tests. Vaccines  Your health care provider may recommend certain vaccines, such as: Influenza vaccine. This is recommended every year. Tetanus, diphtheria, and acellular pertussis (Tdap, Td) vaccine. You may need a Td booster every 10 years. Zoster vaccine. You may need this after age 68. Pneumococcal 13-valent conjugate (PCV13) vaccine. One dose is recommended after age 24. Pneumococcal polysaccharide (PPSV23) vaccine. One dose is recommended after age 84. Talk to your health care provider about which screenings and vaccines you need and how often you  need them. This information is not intended to replace advice given to you by your  health care provider. Make sure you discuss any questions you have with your health care provider. Document Released: 11/16/2015 Document Revised: 07/09/2016 Document Reviewed: 08/21/2015 Elsevier Interactive Patient Education  2017 [employer].  Fall Prevention in the Home Falls can cause injuries. They can happen to people of all ages. There are many things you can do to make your home safe and to help prevent falls. What can I do on the outside of my home? Regularly fix the edges of walkways and driveways and fix any cracks. Remove anything that might make you trip as you walk through a door, such as a raised step or threshold. Trim any bushes or trees on the path to your home. Use bright outdoor lighting. Clear any walking paths of anything that might make someone trip, such as rocks or tools. Regularly check to see if handrails are loose or broken. Make sure that both sides of any steps have handrails. Any raised decks and porches should have guardrails on the edges. Have any leaves, snow, or ice cleared regularly. Use sand or salt on walking paths during winter. Clean up any spills in your garage right away. This includes oil or grease spills. What can I do in the bathroom? Use night lights. Install grab bars by the toilet and in the tub and shower. Do not use towel bars as grab bars. Use non-skid mats or decals in the tub or shower. If you need to sit down in the shower, use a plastic, non-slip stool. Keep the floor dry. Clean up any water that spills on the floor as soon as it happens. Remove soap buildup in the tub or shower regularly. Attach bath mats securely with double-sided non-slip rug tape. Do not have throw rugs and other things on the floor that can make you trip. What can I do in the bedroom? Use night lights. Make sure that you have a light by your bed that is easy to reach. Do not use any sheets or blankets that are too big for your bed. They should not hang down  onto the floor. Have a firm chair that has side arms. You can use this for support while you get dressed. Do not have throw rugs and other things on the floor that can make you trip. What can I do in the kitchen? Clean up any spills right away. Avoid walking on wet floors. Keep items that you use a lot in easy-to-reach places. If you need to reach something above you, use a strong step stool that has a grab bar. Keep electrical cords out of the way. Do not use floor polish or wax that makes floors slippery. If you must use wax, use non-skid floor wax. Do not have throw rugs and other things on the floor that can make you trip. What can I do with my stairs? Do not leave any items on the stairs. Make sure that there are handrails on both sides of the stairs and use them. Fix handrails that are broken or loose. Make sure that handrails are as long as the stairways. Check any carpeting to make sure that it is firmly attached to the stairs. Fix any carpet that is loose or worn. Avoid having throw rugs at the top or bottom of the stairs. If you do have throw rugs, attach them to the floor with carpet tape. Make sure that you have a light switch  at the top of the stairs and the bottom of the stairs. If you do not have them, ask someone to add them for you. What else can I do to help prevent falls? Wear shoes that: Do not have high heels. Have rubber bottoms. Are comfortable and fit you well. Are closed at the toe. Do not wear sandals. If you use a stepladder: Make sure that it is fully opened. Do not climb a closed stepladder. Make sure that both sides of the stepladder are locked into place. Ask someone to hold it for you, if possible. Clearly mark and make sure that you can see: Any grab bars or handrails. First and last steps. Where the edge of each step is. Use tools that help you move around (mobility aids) if they are needed. These include: Canes. Walkers. Scooters. Crutches. Turn  on the lights when you go into a dark area. Replace any light bulbs as soon as they burn out. Set up your furniture so you have a clear path. Avoid moving your furniture around. If any of your floors are uneven, fix them. If there are any pets around you, be aware of where they are. Review your medicines with your doctor. Some medicines can make you feel dizzy. This can increase your chance of falling. Ask your doctor what other things that you can do to help prevent falls. This information is not intended to replace advice given to you by your health care provider. Make sure you discuss any questions you have with your health care provider. Document Released: 08/16/2009 Document Revised: 03/27/2016 Document Reviewed: 11/24/2014 Elsevier Interactive Patient Education  2017 [employer].

## 2023-03-25 NOTE — Progress Notes (Signed)
Subjective:   Edward Briggs is a 72 y.o. male who presents for Medicare Annual/Subsequent preventive examination.  I connected with  Claud C Madonia on 03/25/23 by a telephone enabled telemedicine application and verified that I am speaking with the correct person using two identifiers.   I discussed the limitations of evaluation and management by telemedicine. The patient expressed understanding and agreed to proceed.  Patient location: home  Provider location: telephone/home    Review of Systems     Cardiac Risk Factors include: advanced age (>75men, >43 women);diabetes mellitus;male gender;sedentary lifestyle;hypertension     Objective:    Today's Vitals   There is no height or weight on file to calculate BMI.     03/25/2023    8:47 AM 12/08/2022    3:35 PM 12/04/2022    8:07 AM 03/19/2022    8:20 AM 08/10/2019    9:28 AM 07/22/2019    6:55 PM 12/07/2018    8:13 AM  Advanced Directives  Does Patient Have a Medical Advance Directive? No No No No  No No  Would patient like information on creating a medical advance directive? No - Patient declined No - Patient declined  No - Patient declined No - Patient declined No - Patient declined Yes (ED - Information included in AVS)    Current Medications (verified) Outpatient Encounter Medications as of 03/25/2023  Medication Sig   Accu-Chek Softclix Lancets lancets USE AS DIRECTED UP TO 4 TIMES DAILY   atorvastatin (LIPITOR) 10 MG tablet Take 1 tablet (10 mg total) by mouth daily.   blood glucose meter kit and supplies Dispense based on patient and insurance preference. Use up to four times daily as directed. (FOR ICD-10 E10.9, E11.9).   Blood Glucose Monitoring Suppl (BLOOD GLUCOSE METER) kit Use as instructed   carbidopa-levodopa (SINEMET IR) 25-100 MG tablet Take 1 tablet by mouth 4 (four) times daily.   donepezil (ARICEPT) 10 MG tablet Take 1 tablet (10 mg total) by mouth at bedtime.   glucose blood test strip Check blood sugar 3  times daily   Insulin Pen Needle (BD PEN NEEDLE NANO U/F) 32G X 4 MM MISC USE DAILY AS DIRECTED   LANTUS SOLOSTAR 100 UNIT/ML Solostar Pen INJECT 34 UNITS SUBCUTANEOUSLY ONCE DAILY   lisinopril (ZESTRIL) 5 MG tablet Take 1 tablet (5 mg total) by mouth daily.   metFORMIN (GLUCOPHAGE) 500 MG tablet Take 2 tablets (1,000 mg total) by mouth 2 (two) times daily with a meal.   Ascorbic Acid (VITAMIN C PO) Take 1 tablet by mouth daily.  (Patient not taking: Reported on 03/13/2023)   aspirin 81 MG tablet Take 81 mg by mouth daily. (Patient not taking: Reported on 03/13/2023)   No facility-administered encounter medications on file as of 03/25/2023.    Allergies (verified) Patient has no known allergies.   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   Past Surgical History:  Procedure Laterality Date   ABSCESS DRAINAGE     COLONOSCOPY     ~ 10 yrs ago   FRACTURE SURGERY     HARDWARE REMOVAL  03/26/2012   Procedure: HARDWARE REMOVAL;  Surgeon: Sherri Rad, MD;  Location: [facility];  Service: Orthopedics;  Laterality: Left;  hardware removal deep left ankle syndesmotic  screws only and stress xrays ankle    ORIF ANKLE FRACTURE  12/03/2011   Procedure: OPEN REDUCTION INTERNAL FIXATION (ORIF) ANKLE FRACTURE;  Surgeon: Sherri Rad, MD;  Location: [facility];  Service: Orthopedics;  Laterality: Left;  left medial malleolus fracture   PILONIDAL CYST EXCISION  ~1982   SKIN GRAFT  ~1970   Lt great toe (graft from Lt thigh)   Family History  Problem Relation Age of Onset   Cancer Mother    Cancer Father    Colon cancer Neg Hx    Colon polyps Neg Hx    Esophageal cancer Neg Hx    Rectal cancer Neg Hx    Stomach cancer Neg Hx    Social History   Socioeconomic History   Marital status:  Married    Spouse name: Not on file   Number of children: 2   Years of education: Not on file   Highest education level: Not on file  Occupational History   Occupation: retired  Tobacco Use   Smoking status: Former    Types: Cigarettes    Quit date: 10/30/1988    Years since quitting: 34.4   Smokeless tobacco: Never  Vaping Use   Vaping Use: Never used  Substance and Sexual Activity   Alcohol use: Yes    Alcohol/week: 1.0 standard drink of alcohol    Types: 1 Shots of liquor per week    Comment: every two weeks   Drug use: No   Sexual activity: Yes    Birth control/protection: None  Other Topics Concern   Not on file  Social History Narrative   Not on file   Social Determinants of Health   Financial Resource Strain: Low Risk  (03/25/2023)   Overall Financial Resource Strain (CARDIA)    Difficulty of Paying Living Expenses: Not hard at all  Food Insecurity: No Food Insecurity (03/25/2023)   Hunger Vital Sign    Worried About Running Out of Food in the Last Year: Never true    Ran Out of Food in the Last Year: Never true  Transportation Needs: No Transportation Needs (03/25/2023)   PRAPARE - Administrator, Civil Service (Medical): No    Lack of Transportation (Non-Medical): No  Physical Activity: Inactive (03/25/2023)   Exercise Vital Sign    Days of Exercise per Week: 0 days    Minutes of Exercise per Session: 0 min  Stress: No Stress Concern Present (03/25/2023)   [employer] of Occupational Health - Occupational Stress Questionnaire    Feeling of Stress : Not at all  Social Connections: Moderately Integrated (03/25/2023)   Social Connection and Isolation Panel [NHANES]    Frequency of Communication with Friends and Family: Twice a week    Frequency of Social Gatherings with Friends and Family: Once a week    Attends Religious Services: More than 4 times per year    Active Member of [employer] or Organizations: No    Attends Engineer, structural:  Never    Marital Status: Married    Tobacco Counseling Counseling given: Not Answered   Clinical Intake:  Pre-visit preparation completed: Yes  Pain : No/denies pain     Diabetes: Yes CBG done?: No Did pt. bring in CBG monitor from home?: No  How often do you need to have someone help you when you read instructions, pamphlets, or other written materials from your doctor or pharmacy?: 1 - Never  Diabetic?  Yes  Nutrition Risk  Assessment:  Has the patient had any N/V/D within the last 2 months?  No  Does the patient have any non-healing wounds?  No  Has the patient had any unintentional weight loss or weight gain?  No   Diabetes:  Is the patient diabetic?  Yes  If diabetic, was a CBG obtained today?  No  Did the patient bring in their glucometer from home?  No  How often do you monitor your CBG's? 1 x day.   Financial Strains and Diabetes Management:  Are you having any financial strains with the device, your supplies or your medication? No .  Does the patient want to be seen by Chronic Care Management for management of their diabetes?  No  Would the patient like to be referred to a Nutritionist or for Diabetic Management?  No   Diabetic Exams:  Diabetic Eye Exam: . Overdue for diabetic eye exam. Pt has been advised about the importance in completing this exam. A referral has been placed today. Message sent to referral coordinator for scheduling purposes.   Diabetic Foot Exam:Pt has been advised about the importance in completing this exam.   Interpreter Needed?: No  Information entered by :: Remi Haggard LPN   Activities of Daily Living    03/25/2023    8:48 AM  In your present state of health, do you have any difficulty performing the following activities:  Vision? 0  Difficulty concentrating or making decisions? 1  Dressing or bathing? 0  Doing errands, shopping? 1  Preparing Food and eating ? N  Using the Toilet? N  In the past six months, have you  accidently leaked urine? N  Do you have problems with loss of bowel control? N  Managing your Medications? N  Managing your Finances? N  Housekeeping or managing your Housekeeping? N    Patient Care Team: [name], MD as PCP - General (Family Medicine)  Indicate any recent Medical Services you may have received from other than [facility] providers in the past year (date may be approximate).     Assessment:   This is a routine wellness examination for Edward Briggs.  Hearing/Vision screen Hearing Screening - Comments:: No trouble hearing Vision Screening - Comments:: Unsure of name Not up to date  Dietary issues and exercise activities discussed: Current Exercise Habits: The patient does not participate in regular exercise at present   Goals Addressed             This Visit's Progress    Patient Stated   Not on track    Loose weight     Weight (lb) < 200 lb (90.7 kg)         Depression Screen    03/25/2023    8:50 AM 03/13/2023    8:28 AM 07/03/2022    9:21 AM 04/02/2022   11:18 AM 03/19/2022    8:21 AM 02/13/2022   10:54 AM 01/02/2022   10:51 AM  PHQ 2/9 Scores  PHQ - 2 Score 0 0 0 0 0 0 0  PHQ- 9 Score 1 1 0 0   0    Fall Risk    03/25/2023    8:42 AM 03/13/2023    8:27 AM 07/03/2022    9:21 AM 04/02/2022   11:18 AM 03/19/2022    8:20 AM  Fall Risk   Falls in the past year? 1 1 1  0 0  Number falls in past yr: 1 1 0 0 0  Injury with Fall? 0  0 1 0 0  Risk for fall due to :   History of fall(s) No Fall Risks   Follow up Falls evaluation completed;Education provided;Falls prevention discussed  Falls evaluation completed Falls evaluation completed Falls evaluation completed    FALL RISK PREVENTION PERTAINING TO THE HOME:  Any stairs in or around the home? Yes  If so, are there any without handrails? No  Home free of loose throw rugs in walkways, pet beds, electrical cords, etc? Yes  Adequate lighting in your home to reduce risk of falls? Yes   ASSISTIVE DEVICES  UTILIZED TO PREVENT FALLS:  Life alert? No  Use of a cane, walker or w/c? No  Grab bars in the bathroom? No  Shower chair or bench in shower? No  Elevated toilet seat or a handicapped toilet? No   TIMED UP AND GO:  Was the test performed? No .      Cognitive Function:    05/29/2021    2:57 PM  MMSE - Mini Mental State Exam  Orientation to time 4  Orientation to Place 4  Registration 3  Attention/ Calculation 2  Recall 2  Language- name 2 objects 2  Language- repeat 1  Language- follow 3 step command 2  Language- follow 3 step command-comments handed paper back to me  Language- read & follow direction 0  Language-read & follow direction-comments did not close eyes  Write a sentence 1  Copy design 0  Total score 21        03/25/2023    8:44 AM 08/10/2019    9:28 AM 12/07/2018    8:12 AM  6CIT Screen  What Year? 0 points 0 points 0 points  What month? 0 points 0 points   What time? 0 points 0 points 0 points  Count back from 20 2 points 0 points 0 points  Months in reverse 2 points 0 points 0 points  Repeat phrase 10 points 8 points 0 points  Total Score 14 points 8 points     Immunizations Immunization History  Administered Date(s) Administered   PFIZER Comirnaty(Gray Top)Covid-19 Tri-Sucrose Vaccine 12/16/2019, 01/10/2020, 08/31/2020   Pneumococcal Conjugate-13 04/23/2017   Pneumococcal Polysaccharide-23 12/07/2018    TDAP status: Up to date  Flu Vaccine status: Up to date  Pneumococcal vaccine status: Up to date  Covid-19 vaccine status: Information provided on how to obtain vaccines.   Qualifies for Shingles Vaccine? Yes   Zostavax completed No   Shingrix Completed?: No.    Education has been provided regarding the importance of this vaccine. Patient has been advised to call insurance company to determine out of pocket expense if they have not yet received this vaccine. Advised may also receive vaccine at local pharmacy or Health Dept. Verbalized  acceptance and understanding.  Screening Tests Health Maintenance  Topic Date Due   DTaP/Tdap/Td (1 - Tdap) Never done   COVID-19 Vaccine (4 - 2023-24 season) 04/10/2023 (Originally 07/04/2022)   Zoster Vaccines- Shingrix (1 of 2) 06/25/2023 (Originally 10/23/2001)   OPHTHALMOLOGY EXAM  04/05/2023   HEMOGLOBIN A1C  09/13/2023   FOOT EXAM  10/01/2023   Diabetic kidney evaluation - eGFR measurement  03/12/2024   Diabetic kidney evaluation - Urine ACR  03/12/2024   Medicare Annual Wellness (AWV)  03/24/2024   COLONOSCOPY (Pts 45-26yrs Insurance coverage will need to be confirmed)  12/25/2031   Pneumonia Vaccine 68+ Years old  Completed   Hepatitis C Screening  Completed   HPV VACCINES  Aged Out  INFLUENZA VACCINE  Discontinued    Health Maintenance  Health Maintenance Due  Topic Date Due   DTaP/Tdap/Td (1 - Tdap) Never done    Colorectal cancer screening: Type of screening: Colonoscopy. Completed 2023. Repeat every 10 years  Lung Cancer Screening: (Low Dose CT Chest recommended if Age 19-80 years, 30 pack-year currently smoking OR have quit w/in 15years.) does not qualify.   Lung Cancer Screening Referral:   Additional Screening:  Hepatitis C Screening: does not qualify; Completed 2016  Vision Screening: Recommended annual ophthalmology exams for early detection of glaucoma and other disorders of the eye. Is the patient up to date with their annual eye exam?  No  Who is the provider or what is the name of the office in which the patient attends annual eye exams? Unsure of name If pt is not established with a provider, would they like to be referred to a provider to establish care? No .   Dental Screening: Recommended annual dental exams for proper oral hygiene  Community Resource Referral / Chronic Care Management: CRR required this visit?  No   CCM required this visit?  No      Plan:     I have personally reviewed and noted the following in the patient's chart:    Medical and social history Use of alcohol, tobacco or illicit drugs  Current medications and supplements including opioid prescriptions. Patient is not currently taking opioid prescriptions. Functional ability and status Nutritional status Physical activity Advanced directives List of other physicians Hospitalizations, surgeries, and ER visits in previous 12 months Vitals Screenings to include cognitive, depression, and falls Referrals and appointments  In addition, I have reviewed and discussed with patient certain preventive protocols, quality metrics, and best practice recommendations. A written personalized care plan for preventive services as well as general preventive health recommendations were provided to patient.     Remi Haggard, LPN   [id number]   Nurse Notes:  will bring documentation for TDAP

## 2023-04-09 ENCOUNTER — Encounter (HOSPITAL_COMMUNITY)
Admission: RE | Admit: 2023-04-09 | Discharge: 2023-04-09 | Disposition: A | Discharge status: Home / Self Care | Payer: Medicare Other | Source: Ambulatory Visit | Attending: Neurology | Admitting: Neurology

## 2023-04-09 DIAGNOSIS — G20C Parkinsonism, unspecified: Secondary | ICD-10-CM | POA: Diagnosis not present

## 2023-04-09 DIAGNOSIS — G20A1 Parkinson's disease without dyskinesia, without mention of fluctuations: Secondary | ICD-10-CM | POA: Insufficient documentation

## 2023-04-09 MED ORDER — IOFLUPANE I 123 185 MBQ/2.5ML IV SOLN
4.5000 | Freq: Once | INTRAVENOUS | Status: AC | PRN
  Administered 2023-04-09: 4.5 via INTRAVENOUS
  Filled 2023-04-09: qty 5

## 2023-04-09 MED ORDER — POTASSIUM IODIDE (ANTIDOTE) 130 MG PO TABS
130.0000 mg | ORAL_TABLET | Freq: Once | ORAL | Status: AC
  Administered 2023-04-09: 130 mg via ORAL

## 2023-04-09 MED ORDER — POTASSIUM IODIDE (ANTIDOTE) 130 MG PO TABS
ORAL_TABLET | ORAL | Status: AC
  Filled 2023-04-09: qty 1

## 2023-04-10 NOTE — Progress Notes (Signed)
Please call and inform the patient/wife that the recent scans confirm the diagnosis of Parkinson Disease. Please continue with Sinemet. No further action is required on this test at this time. Please remind patient to keep any upcoming appointments or tests and to call us with any interim questions, concerns, problems or updates. Thanks,   Windell Norfolk, MD

## 2023-04-13 ENCOUNTER — Telehealth: Payer: Self-pay

## 2023-04-13 NOTE — Telephone Encounter (Signed)
-----   Message from Windell Norfolk, MD sent at 04/10/2023  7:44 AM EDT ----- Please call and inform the patient/wife that the recent scans confirm the diagnosis of Parkinson Disease. Please continue with Sinemet. No further action is required on this test at this time. Please remind patient to keep any upcoming appointments or tests and to call us with any interim questions, concerns, problems or updates. Thanks,   Windell Norfolk, MD

## 2023-04-13 NOTE — Telephone Encounter (Signed)
Called and relayed the results to the wife per DPR, she voiced understanding and I advised her to keep her appointment and call if she needed [place].

## 2023-05-20 ENCOUNTER — Ambulatory Visit (INDEPENDENT_AMBULATORY_CARE_PROVIDER_SITE_OTHER): Payer: Medicare Other | Admitting: Podiatry

## 2023-05-20 ENCOUNTER — Encounter: Payer: Self-pay | Admitting: Podiatry

## 2023-05-20 VITALS — BP 148/81

## 2023-05-20 DIAGNOSIS — M79674 Pain in right toe(s): Secondary | ICD-10-CM | POA: Diagnosis not present

## 2023-05-20 DIAGNOSIS — M79675 Pain in left toe(s): Secondary | ICD-10-CM

## 2023-05-20 DIAGNOSIS — E119 Type 2 diabetes mellitus without complications: Secondary | ICD-10-CM

## 2023-05-20 DIAGNOSIS — B351 Tinea unguium: Secondary | ICD-10-CM

## 2023-05-20 NOTE — Progress Notes (Signed)
  Subjective:  Patient ID: Edward Briggs, male    DOB: 21-Jun-1951,  MRN: [id number]  Edward Briggs presents to clinic today for: preventative diabetic foot care and painful thick toenails that are difficult to trim. Pain interferes with ambulation. Aggravating factors include wearing enclosed shoe gear. Pain is relieved with periodic professional debridement.  Chief Complaint  Patient presents with   Nail Problem    DFC,A1C:6.3Referring Provider [name], MD,lov:05/24   ,    PCP is [name], MD.  No Known Allergies  Review of Systems: Negative except as noted in the HPI.  Objective: No changes noted in today's physical examination. Vitals:   05/20/23 1020  BP: (!) 148/81    Edward Briggs is a pleasant 72 y.o. male in NAD. AAO x 3.  Vascular Examination: Capillary refill time <3 seconds b/l LE. Palpable pedal pulses b/l LE. Digital hair present b/l. No pedal edema b/l. Skin temperature gradient WNL b/l. No varicosities b/l. [name]  Dermatological Examination: Pedal skin with normal turgor, texture and tone b/l. No open wounds. No interdigital macerations b/l. Toenails 1-5 b/l thickened, discolored, dystrophic with subungual debris. There is pain on palpation to dorsal aspect of nailplates. .  Neurological Examination: Protective sensation intact with 10 gram monofilament b/l LE.  Musculoskeletal Examination: Normal muscle strength 5/5 to all lower extremity muscle groups bilaterally. HAV with bunion deformity noted b/l LE.[name] No pain, crepitus or joint limitation noted with ROM b/l LE.  Patient ambulates independently without assistive aids.     Latest Ref Rng & Units 03/13/2023    9:27 AM 07/03/2022   10:31 AM  Hemoglobin A1C  Hemoglobin-A1c 4.6 - 6.5 % 7.3  8.1    Assessment/Plan: 1. Pain due to onychomycosis of toenails of both feet   2. Diabetes mellitus without complication (HCC)    -Patient was evaluated and treated. All patient's and/or POA's  questions/concerns answered on today's visit. -Continue foot and shoe inspections daily. Monitor blood glucose per PCP/Endocrinologist's recommendations. -Mycotic toenails 1-5 bilaterally were debrided in length and girth with sterile nail nippers and dremel without incident. -Patient/POA to call should there be question/concern in the interim.   Return in about 3 months (around 08/20/2023).  Freddie Breech, DPM

## 2023-06-12 ENCOUNTER — Telehealth: Payer: Self-pay | Admitting: Family Medicine

## 2023-06-12 DIAGNOSIS — E1165 Type 2 diabetes mellitus with hyperglycemia: Secondary | ICD-10-CM

## 2023-06-12 NOTE — Telephone Encounter (Signed)
Pt is having some pain in his lower back, I didn't see a previous encounter for this. Pt's spouse states that when he takes his Metformin it gets stuck, eventually goes down but takes a while and wanted to make note of this before appt on 8/15.

## 2023-06-15 MED ORDER — METFORMIN HCL 500 MG PO TABS
1000.0000 mg | ORAL_TABLET | Freq: Two times a day (BID) | ORAL | 0 refills | Status: DC
Start: 2023-06-15 — End: 2023-09-18

## 2023-06-15 NOTE — Addendum Note (Signed)
Addended by: Meredith Staggers R on: 06/15/2023 09:31 PM   Modules accepted: Orders

## 2023-06-15 NOTE — Telephone Encounter (Signed)
Noted.  He can try the 500 mg metformin, take 2 pills twice per day to see if that is easier to swallow.  If still having difficulty, then can hold on that medicine until we discuss further at his next visit.  Can discuss back pain at that time as well.

## 2023-06-15 NOTE — Telephone Encounter (Signed)
FYI pt is having some back pains and also having some trouble swallowing metformin notes feels stuck in his throat

## 2023-06-16 NOTE — Telephone Encounter (Signed)
Called to inform of this option, no answer, LM to call back so we can discuss this option

## 2023-06-17 NOTE — Telephone Encounter (Signed)
Called again no answer, LM for [name] to have her return call so we can talk about this medication change

## 2023-06-18 ENCOUNTER — Encounter: Payer: Self-pay | Admitting: Family Medicine

## 2023-06-18 ENCOUNTER — Ambulatory Visit (INDEPENDENT_AMBULATORY_CARE_PROVIDER_SITE_OTHER): Payer: Medicare Other | Admitting: Family Medicine

## 2023-06-18 VITALS — BP 128/76 | HR 67 | Temp 98.0°F | Ht 73.0 in | Wt 246.4 lb

## 2023-06-18 DIAGNOSIS — Z794 Long term (current) use of insulin: Secondary | ICD-10-CM | POA: Diagnosis not present

## 2023-06-18 DIAGNOSIS — M545 Low back pain, unspecified: Secondary | ICD-10-CM

## 2023-06-18 DIAGNOSIS — R1310 Dysphagia, unspecified: Secondary | ICD-10-CM | POA: Diagnosis not present

## 2023-06-18 DIAGNOSIS — E1165 Type 2 diabetes mellitus with hyperglycemia: Secondary | ICD-10-CM | POA: Diagnosis not present

## 2023-06-18 LAB — BASIC METABOLIC PANEL
BUN: 14 mg/dL (ref 6–23)
CO2: 27 mEq/L (ref 19–32)
Calcium: 9.5 mg/dL (ref 8.4–10.5)
Chloride: 100 mEq/L (ref 96–112)
Creatinine, Ser: 0.86 mg/dL (ref 0.40–1.50)
GFR: 87.03 mL/min (ref >60.00)
Glucose, Bld: 96 mg/dL (ref 70–99)
Potassium: 4.2 mEq/L (ref 3.5–5.1)
Sodium: 136 mEq/L (ref 135–145)

## 2023-06-18 LAB — HEMOGLOBIN A1C: Hgb A1c MFr Bld: 7.6 % — ABNORMAL HIGH (ref 4.6–6.5)

## 2023-06-18 NOTE — Progress Notes (Signed)
Subjective:  Patient ID: Edward Briggs, male    DOB: 27-Dec-1950  Age: 72 y.o. MRN: [id number]  CC:  Chief Complaint  Patient presents with   Medical Management of Chronic Issues    Pt having issues swallowing metformin notes he takes 2 500 mg tablets daily and feels it is getting stuck    Back Pain    Pt notes low back pain started about 6 months ago, pt notes it is intermittent, dull aching pain     HPI Edward Briggs presents for   Diabetes, with dysphagia.  Insulin-dependent complicated by hyperglycemia.  Last visit in May.  Lantus 34 units/day at that time, was not aware of previous recommendation to increase dosage. Metformin 500mg  - 2 BID. On 34 units lantus per day.  Home readings 109-120 range.  No sx lows.   Microalbumin: Normal ratio 03/13/2023 Optho, foot exam, pneumovax:  Ophthalmology exam: due? Saw last year.   Lab Results  Component Value Date   HGBA1C 7.3 (H) 03/13/2023   HGBA1C 8.1 (H) 07/03/2022   HGBA1C 7.8 (A) 04/02/2022   Lab Results  Component Value Date   MICROALBUR <0.7 03/13/2023   LDLCALC 69 03/13/2023   CREATININE 0.79 03/13/2023     Pill dysphagia: See recent notes, having trouble swallowing those pills.  Did have recent diagnosis of Parkinson's disease confirmed by [facility].  Treated with Sinemet.  No choking or regurgitation but some pill dysphagia discussed in May.  Plan for follow-up with speech therapist for swallowing study.  I did try changing to 500 mg pills with 2 per dose to see if that was easier to swallow.  Still some difficulty with swallowing pills. Sinemet, metformin get hung up. They do go down, but feel like get stuck in back of throat temporarily. Same as last visit. Easier for pill to pass with eating oatmeal.  Able to eat, drink ok. Has not had swallow study. SLP eval in April. Was not having issue then.     Low back pain Did have a fall prior to back pain - mechanical fall. No other injuries. Sore in back after fall -  about a week or two later. Pain comes and goes since that time.  No leg radiation.  No bowel or bladder incontinence, no saddle anesthesia, no lower extremity weakness.  Tx: otc BenGay ointment - some help.    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   Past Surgical History:  Procedure Laterality Date   ABSCESS DRAINAGE     COLONOSCOPY     ~ 10 yrs ago   FRACTURE SURGERY     HARDWARE REMOVAL  03/26/2012   Procedure: HARDWARE REMOVAL;  Surgeon: Sherri Rad, MD;  Location: [facility];  Service: Orthopedics;  Laterality: Left;  hardware removal deep left ankle syndesmotic screws only and stress xrays ankle    ORIF ANKLE FRACTURE  12/03/2011   Procedure: OPEN REDUCTION INTERNAL FIXATION (ORIF) ANKLE  FRACTURE;  Surgeon: Sherri Rad, MD;  Location: [facility];  Service: Orthopedics;  Laterality: Left;  left medial malleolus fracture   PILONIDAL CYST EXCISION  ~1982   SKIN GRAFT  ~1970   Lt great toe (graft from Lt thigh)   No Known Allergies Prior to Admission medications   Medication Sig Start Date End Date Taking? Authorizing Provider  Accu-Chek Softclix Lancets lancets USE AS DIRECTED UP TO 4 TIMES DAILY 01/22/22  Yes [name], MD  Ascorbic Acid (VITAMIN C PO) Take 1 tablet by mouth daily.   Yes [provider]  aspirin 81 MG tablet Take 81 mg by mouth daily.   Yes [provider]  atorvastatin (LIPITOR) 10 MG tablet Take 1  tablet (10 mg total) by mouth daily. 03/13/23  Yes [name], MD  blood glucose meter kit and supplies Dispense based on patient and insurance preference. Use up to four times daily as directed. (FOR ICD-10 E10.9, E11.9). 11/25/21  Yes [name], MD  Blood Glucose Monitoring Suppl (BLOOD GLUCOSE METER) kit Use as instructed 09/10/12  Yes Peyton Najjar, MD  carbidopa-levodopa (SINEMET IR) 25-100 MG tablet Take 1 tablet by mouth 4 (four) times daily. 03/05/23 02/28/24 Yes Camara, Amalia Hailey, MD  donepezil (ARICEPT) 10 MG tablet Take 1 tablet (10 mg total) by mouth at bedtime. 12/01/22 11/26/23 Yes Camara, Amalia Hailey, MD  glucose blood test strip Check blood sugar 3 times daily 11/25/21  Yes [name], MD  Insulin Pen Needle (BD PEN NEEDLE NANO U/F) 32G X 4 MM MISC USE DAILY AS DIRECTED 03/28/21  Yes [name], MD  LANTUS SOLOSTAR 100 UNIT/ML Solostar Pen INJECT 34 UNITS SUBCUTANEOUSLY ONCE DAILY 03/13/23  Yes [name], MD  lisinopril (ZESTRIL) 5 MG tablet Take 1 tablet (5 mg total) by mouth daily. 01/16/23  Yes [name], MD  metFORMIN (GLUCOPHAGE) 500 MG tablet Take 2 tablets (1,000 mg total) by mouth 2 (two) times daily with a meal. 06/15/23  Yes [name], MD   Social History   Socioeconomic History   Marital status: Married    Spouse name: Not on file   Number of children: 2   Years of education: Not on file   Highest education level: Not on file  Occupational History   Occupation: retired  Tobacco Use   Smoking status: Former    Current packs/day: 0.00    Types: Cigarettes    Quit date: 10/30/1988    Years since quitting: 34.6   Smokeless tobacco: Never  Vaping Use   Vaping status: Never Used  Substance and Sexual Activity   Alcohol use: Yes    Alcohol/week: 1.0 standard drink of alcohol    Types: 1 Shots of liquor per week    Comment: every two weeks   Drug use: No   Sexual activity: Yes    Birth control/protection: None  Other Topics  Concern   Not on file  Social History Narrative   Not on file   Social Determinants of Health   Financial Resource Strain: Low Risk  (03/25/2023)   Overall Financial Resource Strain (CARDIA)    Difficulty of Paying Living Expenses: Not hard at all  Food Insecurity: No Food Insecurity (03/25/2023)   Hunger Vital Sign    Worried About Running Out of Food in the Last Year: Never true    Ran Out of Food in the Last Year: Never true  Transportation Needs: No Transportation Needs (  03/25/2023)   PRAPARE - Administrator, Civil Service (Medical): No    Lack of Transportation (Non-Medical): No  Physical Activity: Inactive (03/25/2023)   Exercise Vital Sign    Days of Exercise per Week: 0 days    Minutes of Exercise per Session: 0 min  Stress: No Stress Concern Present (03/25/2023)   [employer] of Occupational Health - Occupational Stress Questionnaire    Feeling of Stress : Not at all  Social Connections: Moderately Integrated (03/25/2023)   Social Connection and Isolation Panel [NHANES]    Frequency of Communication with Friends and Family: Twice a week    Frequency of Social Gatherings with Friends and Family: Once a week    Attends Religious Services: More than 4 times per year    Active Member of [employer] or Organizations: No    Attends Banker Meetings: Never    Marital Status: Married  Catering manager Violence: Not At Risk (03/25/2023)   Humiliation, Afraid, Rape, and Kick questionnaire    Fear of Current or Ex-Partner: No    Emotionally Abused: No    Physically Abused: No    Sexually Abused: No    Review of Systems   Objective:   Vitals:   06/18/23 1017  BP: 128/76  Pulse: 67  Temp: 98 F (36.7 C)  TempSrc: Temporal  SpO2: 98%  Weight: 246 lb 6.4 oz (111.8 kg)  Height: 6\' 1"  (1.854 m)     Physical Exam Vitals reviewed.  Constitutional:      Appearance: He is well-developed.  HENT:     Head: Normocephalic and atraumatic.     Nose:  Rhinorrhea present.  Neck:     Vascular: No carotid bruit or JVD.  Cardiovascular:     Rate and Rhythm: Normal rate and regular rhythm.     Heart sounds: Normal heart sounds. No murmur heard. Pulmonary:     Effort: Pulmonary effort is normal.     Breath sounds: Normal breath sounds. No rales.  Musculoskeletal:     Right lower leg: No edema.     Left lower leg: No edema.     Comments: Lower lumbar spine, diffuse midline discomfort without focal bony tenderness.  Mild discomfort.  No appreciable paraspinal spasm or tenderness.  Overall intact range of motion without appreciable discomfort, negative seated straight leg raise.  Able to ambulate without difficulty.   Skin:    General: Skin is warm and dry.  Neurological:     Mental Status: He is alert and oriented to person, place, and time.  Psychiatric:        Mood and Affect: Mood normal.        Assessment & Plan:  CONRAD ABITZ is a 72 y.o. male . Pill dysphagia - Plan: Ambulatory referral to Speech Therapy  -With history of Parkinson's.  Refer to speech therapy for possible swallowing study.  Techniques discussed including drinking with fluid, following pills with meal or oatmeal as that has been effective.  RTC precautions if acute changes.    Type 2 diabetes mellitus with hyperglycemia, with long-term current use of insulin (HCC) - Plan: Basic metabolic panel, Hemoglobin A1c  -Overall stable on home readings, check A1c and adjust plan accordingly.  May need to look at other options if persistent difficulty swallowing metformin.  Midline low back pain without sciatica, unspecified chronicity - Plan: DG Lumbar Spine Complete  -Off-and-on past 6 months, did not fall initially.  Check imaging to rule out compression  fracture.  History of chronic back pain, possible degenerative disc disease, chronic component.  Reassuring exam.  Consider PT or back specialist eval if persistent and depending on imaging results.  70-month  follow-up.  No orders of the defined types were placed in this encounter.  Patient Instructions   I sent a message to speech therapist and your neurologist about the difficulty swallowing pills.  I will refer you to speech therapy for a swallowing study.   Ok to eat small amount of oatmeal if needed after pills for now if that has been effective.  No change in meds until I see lab results. If over a year since eye specialist visit, schedule appointment.   Please have x-ray performed at the [facility] location below for your back pain.  Okay to use topical ointments, Tylenol if needed, and see other information below.  If not improving, we do have the option of physical therapy or back specialist.  Let me know.  Chronic Back Pain Chronic back pain is back pain that lasts longer than 3 months. The cause of your back pain may not be known. Some common causes include: Wear and tear (degenerative disease) of the bones, disks, or tissues that connect bones to each other (ligaments) in your back. Inflammation and stiffness in your back (arthritis). If you have chronic back pain, you may have times when the pain is more intense (flare-ups). You can also learn to manage the pain with home care. Follow these instructions at home: Watch for any changes in your symptoms. Take these actions to help with your pain: Managing pain and stiffness     If told, put ice on the painful area. You may be told to apply ice for the first 24-48 hours after a flare-up starts. Put ice in a plastic bag. Place a towel between your skin and the bag. Leave the ice on for 20 minutes, 2-3 times per day. If told, apply heat to the affected area as often as told by your health care provider. Use the heat source that your provider recommends, such as a moist heat pack or a heating pad. Place a towel between your skin and the heat source. Leave the heat on for 20-30 minutes. If your skin turns bright red, remove the ice or  heat right away to prevent skin damage. The risk of damage is higher if you cannot feel pain, heat, or cold. Try soaking in a warm tub. Activity        Avoid bending and other activities that make the pain worse. Have good posture when you stand or sit. When you stand, keep your upper back and neck straight, with your shoulders pulled back. Avoid slouching. When you sit, keep your back straight. Relax your shoulders. Do not round your shoulders or pull them backward. Do not sit or stand in one place for too long. Take brief periods of rest during the day. This will reduce your pain. Resting in a lying or standing position is often better than sitting to rest. When you rest for longer periods, mix in some mild activity or stretching between periods of rest. This will help to prevent stiffness and pain. Get regular exercise. Ask your provider what activities are safe for you. You may have to avoid lifting. Ask your provider how much you can safely lift. If you do lift, always use the right technique. This means you should: Bend your knees. Keep the load close to your body. Avoid twisting. Medicines Take  over-the-counter and prescription medicines only as told by your provider. You may need to take medicines for pain and inflammation. These may be taken by mouth or put on the skin. You may also be given muscle relaxants. Ask your provider if the medicine prescribed to you: Requires you to avoid driving or using machinery. Can cause constipation. You may need to take these actions to prevent or treat constipation: Drink enough fluid to keep your pee (urine) pale yellow. Take over-the-counter or prescription medicines. Eat foods that are high in fiber, such as beans, whole grains, and fresh fruits and vegetables. Limit foods that are high in fat and processed sugars, such as fried or sweet foods. General instructions  Sleep on a firm mattress in a comfortable position. Try lying on your  side with your knees slightly bent. If you lie on your back, put a pillow under your knees. Do not use any products that contain nicotine or tobacco. These products include cigarettes, chewing tobacco, and vaping devices, such as e-cigarettes. If you need help quitting, ask your provider. Contact a health care provider if: You have pain that does not get better with rest or medicine. You have new pain. You have a fever. You lose weight quickly. You have trouble doing your normal activities. You feel weak or numb in one or both of your legs or feet. Get help right away if: You are not able to control when you pee or poop. You have severe back pain and: Nausea or vomiting. Pain in your chest or abdomen. Shortness of breath. You faint. These symptoms may be an emergency. Get help right away. Call 911. Do not wait to see if the symptoms will go away. Do not drive yourself to the hospital. This information is not intended to replace advice given to you by your health care provider. Make sure you discuss any questions you have with your health care provider. Document Revised: 06/09/2022 Document Reviewed: 06/09/2022 Elsevier Patient Education  2024 Elsevier Inc.     Signed,   Meredith Staggers, MD [facility], [facility] 06/18/23 1:14 PM

## 2023-06-18 NOTE — Telephone Encounter (Signed)
Will discuss at appt today.  

## 2023-06-18 NOTE — Patient Instructions (Addendum)
I sent a message to speech therapist and your neurologist about the difficulty swallowing pills.  I will refer you to speech therapy for a swallowing study.   Ok to eat small amount of oatmeal if needed after pills for now if that has been effective.  No change in meds until I see lab results. If over a year since eye specialist visit, schedule appointment.   Please have x-ray performed at the [facility] location below for your back pain.  Okay to use topical ointments, Tylenol if needed, and see other information below.  If not improving, we do have the option of physical therapy or back specialist.  Let me know.  Chronic Back Pain Chronic back pain is back pain that lasts longer than 3 months. The cause of your back pain may not be known. Some common causes include: Wear and tear (degenerative disease) of the bones, disks, or tissues that connect bones to each other (ligaments) in your back. Inflammation and stiffness in your back (arthritis). If you have chronic back pain, you may have times when the pain is more intense (flare-ups). You can also learn to manage the pain with home care. Follow these instructions at home: Watch for any changes in your symptoms. Take these actions to help with your pain: Managing pain and stiffness     If told, put ice on the painful area. You may be told to apply ice for the first 24-48 hours after a flare-up starts. Put ice in a plastic bag. Place a towel between your skin and the bag. Leave the ice on for 20 minutes, 2-3 times per day. If told, apply heat to the affected area as often as told by your health care provider. Use the heat source that your provider recommends, such as a moist heat pack or a heating pad. Place a towel between your skin and the heat source. Leave the heat on for 20-30 minutes. If your skin turns bright red, remove the ice or heat right away to prevent skin damage. The risk of damage is higher if you cannot feel pain, heat, or  cold. Try soaking in a warm tub. Activity        Avoid bending and other activities that make the pain worse. Have good posture when you stand or sit. When you stand, keep your upper back and neck straight, with your shoulders pulled back. Avoid slouching. When you sit, keep your back straight. Relax your shoulders. Do not round your shoulders or pull them backward. Do not sit or stand in one place for too long. Take brief periods of rest during the day. This will reduce your pain. Resting in a lying or standing position is often better than sitting to rest. When you rest for longer periods, mix in some mild activity or stretching between periods of rest. This will help to prevent stiffness and pain. Get regular exercise. Ask your provider what activities are safe for you. You may have to avoid lifting. Ask your provider how much you can safely lift. If you do lift, always use the right technique. This means you should: Bend your knees. Keep the load close to your body. Avoid twisting. Medicines Take over-the-counter and prescription medicines only as told by your provider. You may need to take medicines for pain and inflammation. These may be taken by mouth or put on the skin. You may also be given muscle relaxants. Ask your provider if the medicine prescribed to you: Requires you to avoid  driving or using machinery. Can cause constipation. You may need to take these actions to prevent or treat constipation: Drink enough fluid to keep your pee (urine) pale yellow. Take over-the-counter or prescription medicines. Eat foods that are high in fiber, such as beans, whole grains, and fresh fruits and vegetables. Limit foods that are high in fat and processed sugars, such as fried or sweet foods. General instructions  Sleep on a firm mattress in a comfortable position. Try lying on your side with your knees slightly bent. If you lie on your back, put a pillow under your knees. Do not use  any products that contain nicotine or tobacco. These products include cigarettes, chewing tobacco, and vaping devices, such as e-cigarettes. If you need help quitting, ask your provider. Contact a health care provider if: You have pain that does not get better with rest or medicine. You have new pain. You have a fever. You lose weight quickly. You have trouble doing your normal activities. You feel weak or numb in one or both of your legs or feet. Get help right away if: You are not able to control when you pee or poop. You have severe back pain and: Nausea or vomiting. Pain in your chest or abdomen. Shortness of breath. You faint. These symptoms may be an emergency. Get help right away. Call 911. Do not wait to see if the symptoms will go away. Do not drive yourself to the hospital. This information is not intended to replace advice given to you by your health care provider. Make sure you discuss any questions you have with your health care provider. Document Revised: 06/09/2022 Document Reviewed: 06/09/2022 Elsevier Patient Education  2024 [employer].

## 2023-06-19 ENCOUNTER — Telehealth: Payer: Self-pay

## 2023-06-19 NOTE — Telephone Encounter (Signed)
-----   Message from [name] sent at 06/19/2023  1:42 PM EDT ----- 4-month blood sugar test slightly elevated.  Electrolytes overall look okay.  Based on blood sugar in the office I do not think we should adjust diabetes medicines at this time.  Recheck in 3 months.  Let me know if there are questions.

## 2023-06-22 NOTE — Telephone Encounter (Signed)
Pt has been informed.

## 2023-06-29 ENCOUNTER — Telehealth: Payer: Self-pay

## 2023-06-29 NOTE — Telephone Encounter (Signed)
-----   Message from [name] sent at 06/27/2023  7:49 AM EDT ----- Results sent by MyChart, but appears patient has not yet reviewed those results.  Please call and make sure they have either seen note or discuss result note.  Thanks.

## 2023-07-02 ENCOUNTER — Encounter: Payer: Self-pay | Admitting: Speech Pathology

## 2023-07-02 ENCOUNTER — Ambulatory Visit: Payer: Medicare Other | Attending: Neurology | Admitting: Speech Pathology

## 2023-07-02 DIAGNOSIS — R1310 Dysphagia, unspecified: Secondary | ICD-10-CM | POA: Diagnosis not present

## 2023-07-02 NOTE — Addendum Note (Signed)
Addended by: Maia Breslow on: 07/02/2023 01:23 PM   Modules accepted: Orders

## 2023-07-02 NOTE — Therapy (Signed)
OUTPATIENT SPEECH LANGUAGE PATHOLOGY SWALLOW EVALUATION   Patient Name: Edward Briggs MRN: [id number] DOB:[birth year], 72 y.o., male Today's Date: 07/02/2023  PCP: Carley Hammed, MD REFERRING PROVIDER: Carley Hammed, MD  END OF SESSION:  End of Session - 07/02/23 1317     Visit Number 1    Number of Visits 9    Date for SLP Re-Evaluation 08/27/23    SLP Start Time 1230    SLP Stop Time  1305    SLP Time Calculation (min) 35 min    Activity Tolerance Patient tolerated treatment well             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   Past Surgical History:  Procedure Laterality Date   ABSCESS DRAINAGE     COLONOSCOPY     ~ 10 yrs ago   FRACTURE SURGERY     HARDWARE REMOVAL  03/26/2012   Procedure: HARDWARE REMOVAL;  Surgeon: Sherri Rad, MD;  Location: [facility];  Service: Orthopedics;  Laterality: Left;  hardware removal deep left ankle syndesmotic screws only and stress xrays ankle    ORIF ANKLE FRACTURE  12/03/2011   Procedure: OPEN REDUCTION INTERNAL FIXATION (ORIF) ANKLE FRACTURE;  Surgeon: Sherri Rad, MD;  Location: [facility];  Service: Orthopedics;  Laterality: Left;  left medial malleolus fracture   PILONIDAL CYST EXCISION  ~1982   SKIN GRAFT  ~1970   Lt great toe (graft from Lt thigh)   Patient Active Problem List   Diagnosis Date Noted   HTN (hypertension) 07/22/2019   Acute metabolic encephalopathy 07/22/2019   AKI (acute kidney injury) (HCC) 07/22/2019   Sepsis (HCC) 07/22/2019   UTI (urinary tract infection) 07/22/2019   Diabetes mellitus without complication (HCC) 09/10/2012   CHEST PAIN 07/21/2009    ONSET DATE: 06/18/2023   REFERRING DIAG: R13.10 (ICD-10-CM) - Pill dysphagia   THERAPY DIAG:  Pill dysphagia  Rationale for  Evaluation and Treatment: Rehabilitation  SUBJECTIVE:   SUBJECTIVE STATEMENT: "I have trouble with my food and medicine going down" Pt accompanied by: significant other, [name]  PERTINENT HISTORY: Per MD note, "Pill dysphagia: See recent notes, having trouble swallowing those pills.  Did have recent diagnosis of Parkinson's disease confirmed by [facility].  Treated with Sinemet.  No choking or regurgitation but some pill dysphagia discussed in May.  Plan for follow-up with speech therapist for swallowing study.  I did try changing to 500 mg pills with 2 per dose to see if that was easier to swallow.  Still some difficulty with swallowing pills. Sinemet, metformin get hung up. They do go down, but feel like get stuck in back of throat temporarily. Same as last visit. Easier for pill to pass with eating oatmeal.  Able to eat, drink ok. Has not had swallow study. SLP eval in April. Was not having issue then."  PAIN:  Are you having pain? No  FALLS: Has patient fallen in last 6 months?  No  LIVING ENVIRONMENT: Lives with: lives with their family Lives in: House/apartment  PLOF:  Level of assistance: Needed assistance with IAD[employer] Employment: Retired  PATIENT GOALS: improved swallow  OBJECTIVE:   INSTRUMENTAL SWALLOW STUDY FINDINGS (MBSS) ordered based on pt report of dysphagia Objective swallow impairments: TBD Objective  recommended compensations: TBD  COGNITION: Overall cognitive status: History of cognitive impairments - at baseline  SUBJECTIVE DYSPHAGIA REPORTS:  Date of onset: several months  Reported symptoms: globus sensation (points to throat), occasional coughing with liquids  Current diet: regular and thin liquids  Pt with hx of GERD per chart review  Co-morbid voice changes: No  FACTORS WHICH MAY INCREASE RISK OF ADVERSE EVENT IN PRESENCE OF ASPIRATION:  General health: well appearing  Risk factors: reduced cognitive function and poor oral health    ORAL MOTOR  EXAMINATION: Overall status: WFL Comments: slight L facial droop  CLINICAL SWALLOW ASSESSMENT:   Dentition: edentulous lower, top teeth intact  Vocal quality at baseline: low vocal intensity Patient directly observed with POs: Yes: regular, dysphagia 1 (puree), and thin liquids  Feeding: able to feed self Liquids provided by: cup [facility] Swallow Protocol: Pass Oral phase signs and symptoms: oral residue following initial swallow  Pharyngeal phase signs and symptoms: complaints of sensation residue with hard solid. Did not clear with liquid wash or puree bolus.    TODAY'S TREATMENT:                                                                                                                                         DATE:  Education provided on evaluation results and SLP's recommendations. Initiated training re: swallow strategies to address c/o dysphagia with pills and hard solids. Pt verbalizes agreement with POC, all questions answered to satisfaction.   PATIENT EDUCATION: Education details: see above Person educated: Patient and Spouse Education method: Medical illustrator Education comprehension: verbalized understanding, returned demonstration, and needs further education   ASSESSMENT:  CLINICAL IMPRESSION: Patient is a 72 y.o. M who was seen today for dysphagia evaluation. Evaluation reveals mild dysphagia. Pt's swallow is c/b c/o globus sensation (pointing to area of larynx) with pills and hard solids. Pt reports problem ongoing for several months. Is using oatmeal to clear. Is not avoiding any foods. 3oz water challenge passed indicating low likelihood of prandial aspiration. Pt does have dx of PD, which can impact swallow function. SLP recommends instrumental swallow study to determine potential pharyngeal deficits. Pt also presenting with hypokinetic dysarthria, defers to address this date. Pt is scheduled for screening in Nov, agreeable to addressing at that time, if  indicated. Pt would benefit from skilled ST to address aforementioned deficits to facilitate improved swallow function/safety .   OBJECTIVE IMPAIRMENTS: include dysarthria and dysphagia. These impairments are limiting patient from effectively communicating at home and in community and safety when swallowing. Factors affecting potential to achieve goals and functional outcome are co-morbidities. Patient will benefit from skilled SLP services to address above impairments and improve overall function.  REHAB POTENTIAL: Good   GOALS: Goals reviewed with patient? Yes  SHORT TERM GOALS: Target date: 07/24/2023  Pt will complete MBSS Baseline: Goal status: INITIAL  2.  Pt will teach back  strategies/compensations to aid in improved swallow function/safety  Baseline:  Goal status: INITIAL   LONG TERM GOALS: Target date: 08/27/2023  Pt will demonstrate dysphagia exercises (if indicated per MBSS) with 100% accuracy Baseline:  Goal status: INITIAL  2.  Pt will report completion of HEP (if indicated per MBSS) 5/7 days over 1 week period Baseline:  Goal status: INITIAL   PLAN:  SLP FREQUENCY: 2x/week  SLP DURATION: 8 weeks  PLANNED INTERVENTIONS: Aspiration precaution training, Pharyngeal strengthening exercises, Diet toleration management , Cueing hierachy, Internal/external aids, SLP instruction and feedback, Compensatory strategies, Patient/family education, and Re-evaluation    Maia Breslow, CCC-SLP 07/02/2023, 1:18 PM

## 2023-07-03 ENCOUNTER — Other Ambulatory Visit (HOSPITAL_COMMUNITY): Payer: Self-pay | Admitting: *Deleted

## 2023-07-03 DIAGNOSIS — R1310 Dysphagia, unspecified: Secondary | ICD-10-CM

## 2023-07-11 ENCOUNTER — Other Ambulatory Visit: Payer: Self-pay | Admitting: Family Medicine

## 2023-07-11 DIAGNOSIS — I10 Essential (primary) hypertension: Secondary | ICD-10-CM

## 2023-07-20 ENCOUNTER — Ambulatory Visit (HOSPITAL_COMMUNITY)
Admission: RE | Admit: 2023-07-20 | Discharge: 2023-07-20 | Disposition: A | Discharge status: Home / Self Care | Payer: Medicare Other | Source: Ambulatory Visit | Attending: Family Medicine | Admitting: Family Medicine

## 2023-07-20 ENCOUNTER — Ambulatory Visit (HOSPITAL_COMMUNITY)
Admission: RE | Admit: 2023-07-20 | Discharge: 2023-07-20 | Disposition: A | Discharge status: Home / Self Care | Payer: Medicare Other | Source: Ambulatory Visit | Attending: Family Medicine

## 2023-07-20 DIAGNOSIS — E119 Type 2 diabetes mellitus without complications: Secondary | ICD-10-CM | POA: Diagnosis not present

## 2023-07-20 DIAGNOSIS — G20A1 Parkinson's disease without dyskinesia, without mention of fluctuations: Secondary | ICD-10-CM | POA: Diagnosis not present

## 2023-07-20 DIAGNOSIS — K219 Gastro-esophageal reflux disease without esophagitis: Secondary | ICD-10-CM | POA: Insufficient documentation

## 2023-07-20 DIAGNOSIS — R059 Cough, unspecified: Secondary | ICD-10-CM | POA: Diagnosis not present

## 2023-07-20 DIAGNOSIS — I10 Essential (primary) hypertension: Secondary | ICD-10-CM | POA: Diagnosis not present

## 2023-07-20 DIAGNOSIS — R1311 Dysphagia, oral phase: Secondary | ICD-10-CM | POA: Insufficient documentation

## 2023-07-20 DIAGNOSIS — R1310 Dysphagia, unspecified: Secondary | ICD-10-CM | POA: Diagnosis not present

## 2023-07-20 NOTE — Evaluation (Addendum)
Modified Barium Swallow Study  Patient Details  Name: Edward Briggs MRN: [id number] Date of Birth: [birth year]  Today's Date: 07/20/2023  Modified Barium Swallow completed.  Full report located under Chart Review in the Imaging Section.  History of Present Illness Pt is a 72 yo male presenting for OP MBS due to difficulty swallowing pills. Pt was evaluated clinically by OP SLP 07/02/23 with findings of globus sensation with pills and hard solids, facilitated by use of oatmeal. MBS was recommended to evaluate oropharyngeal function in the setting of PD. PMH includes: Parkinson's disease, GERD, memory loss, DM, HTN   Clinical Impression Pt has a mild oral dysphagia but with functional overall swallow. He takes extra time for mastication and has a little more of a munching pattern, using primarily his anterior dentition as he is missing several teeth. He has some reduced bolus cohesion, and liquids in particular spill laterally and sublingually. His posterior lingual transit is mostly slowed but at times is a little more repetitive/disorganized. He has mild oral residue that he manages well by completing a second swallow. Pt did require multiple liquid boluses to clear the barium tablet from his oral cavity, but there was no pharyngeal residue with this consistency or any other one, even though pt did report he felt like something was there while he was swallowing. Pt's pharyngeal phase is [facility] with a single episode of trace penetration to the vocal folds due to impaired timing when drinking via straw. This cleared spontaneously upon subsequent swallows (PAS 4, considered to be normal). Question if globus sensation could be more esophageal in nature, so handout was provided and pt was educated about esophageal precautions. Would continue with regular solids and thin liquids as tolerated.  Factors that may increase risk of adverse event in presence of aspiration Rubye Oaks & Clearance Coots 2021): Limited  mobility  Swallow Evaluation Recommendations Recommendations: PO diet PO Diet Recommendation: Regular;Thin liquids (Level 0) Liquid Administration via: Cup;Straw Medication Administration: Whole meds with liquid (use purees/oatmeal PRN) Supervision: Patient able to self-feed Swallowing strategies  : Slow rate;Small bites/sips;Follow solids with liquids Postural changes: Position pt fully upright for meals;Stay upright 30-60 min after meals Oral care recommendations: Oral care BID (2x/day)      Mahala Menghini., M.A. CCC-SLP Acute Rehabilitation Services Office [phone]  Secure chat preferred  07/20/2023,12:59 PM

## 2023-07-23 ENCOUNTER — Telehealth: Payer: Self-pay

## 2023-07-23 ENCOUNTER — Ambulatory Visit: Payer: Medicare Other | Attending: Neurology | Admitting: Speech Pathology

## 2023-07-23 DIAGNOSIS — R1310 Dysphagia, unspecified: Secondary | ICD-10-CM | POA: Diagnosis not present

## 2023-07-23 NOTE — Patient Instructions (Signed)
[email or web address]  Scroll halfway down, click What is Parkinson's [employer]

## 2023-07-23 NOTE — Telephone Encounter (Signed)
-----   Message from [name] sent at 07/22/2023  9:37 PM EDT ----- Call patient.  Swallowing study indicated that he can continue regular diet and thin liquids as tolerated.  Continue to swallow slow, small bites and sips and follow solids with liquids.  Can take meds with liquid using pures or oatmeal as needed.  Should sit upright for meals, and remain upright 30 to 60 minutes after meals, and oral care twice per day.  Let me know if there are questions.

## 2023-07-23 NOTE — Therapy (Signed)
OUTPATIENT SPEECH LANGUAGE PATHOLOGY TREATMENT NOTE   Patient Name: Edward Briggs MRN: [id number] DOB:1951/02/04, 72 y.o., male Today's Date: 07/23/2023  PCP: Carley Hammed, MD REFERRING PROVIDER: Carley Hammed, MD  END OF SESSION:  End of Session - 07/23/23 0757     Visit Number 2    Number of Visits 9    Date for SLP Re-Evaluation 08/27/23    SLP Start Time 0758    SLP Stop Time  0830    SLP Time Calculation (min) 32 min    Activity Tolerance Patient tolerated treatment well             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   Past Surgical History:  Procedure Laterality Date   ABSCESS DRAINAGE     COLONOSCOPY     ~ 10 yrs ago   FRACTURE SURGERY     HARDWARE REMOVAL  03/26/2012   Procedure: HARDWARE REMOVAL;  Surgeon: Sherri Rad, MD;  Location: [facility];  Service: Orthopedics;  Laterality: Left;  hardware removal deep left ankle syndesmotic screws only and stress xrays ankle    ORIF ANKLE FRACTURE  12/03/2011   Procedure: OPEN REDUCTION INTERNAL FIXATION (ORIF) ANKLE FRACTURE;  Surgeon: Sherri Rad, MD;  Location: [facility];  Service: Orthopedics;  Laterality: Left;  left medial malleolus fracture   PILONIDAL CYST EXCISION  ~1982   SKIN GRAFT  ~1970   Lt great toe (graft from Lt thigh)   Patient Active Problem List   Diagnosis Date Noted   HTN (hypertension) 07/22/2019   Acute metabolic encephalopathy 07/22/2019   AKI (acute kidney injury) (HCC) 07/22/2019   Sepsis (HCC) 07/22/2019   UTI (urinary tract infection) 07/22/2019   Diabetes mellitus without complication (HCC) 09/10/2012   CHEST PAIN 07/21/2009    ONSET DATE: 06/18/2023   REFERRING DIAG: R13.10 (ICD-10-CM) - Pill dysphagia   THERAPY DIAG:  Pill dysphagia  Dysphagia,  unspecified type  Rationale for Evaluation and Treatment: Rehabilitation  SUBJECTIVE:   SUBJECTIVE STATEMENT: "pretty much the same"   PERTINENT HISTORY: Per MD note, "Pill dysphagia: See recent notes, having trouble swallowing those pills.  Did have recent diagnosis of Parkinson's disease confirmed by [facility].  Treated with Sinemet.  No choking or regurgitation but some pill dysphagia discussed in May.  Plan for follow-up with speech therapist for swallowing study.  I did try changing to 500 mg pills with 2 per dose to see if that was easier to swallow.  Still some difficulty with swallowing pills. Sinemet, metformin get hung up. They do go down, but feel like get stuck in back of throat temporarily. Same as last visit. Easier for pill to pass with eating oatmeal.  Able to eat, drink ok. Has not had swallow study. SLP eval in April. Was not having issue then."  PAIN:  Are you having pain? No  FALLS: Has patient fallen in last 6 months?  No  LIVING ENVIRONMENT: Lives with: lives with their family Lives in: House/apartment  PLOF:  Level of assistance: Needed assistance with IADLS Employment: Retired  PATIENT GOALS: improved swallow  OBJECTIVE:   INSTRUMENTAL SWALLOW STUDY FINDINGS (MBSS) 07/20/23 Clinical Impression Pt has a mild oral dysphagia but with functional overall swallow. He takes extra time for  mastication and has a little more of a munching pattern, using primarily his anterior dentition as he is missing several teeth. He has some reduced bolus cohesion, and liquids in particular spill laterally and sublingually. His posterior lingual transit is mostly slowed but at times is a little more repetitive/disorganized. He has mild oral residue that he manages well by completing a second swallow. Pt did require multiple liquid boluses to clear the barium tablet from his oral cavity, but there was no pharyngeal residue with this consistency or any other one, even though pt did  report he felt like something was there while he was swallowing. Pt's pharyngeal phase is [facility] with a single episode of trace penetration to the vocal folds due to impaired timing when drinking via straw. This cleared spontaneously upon subsequent swallows (PAS 4, considered to be normal). Question if globus sensation could be more esophageal in nature, so handout was provided and pt was educated about esophageal precautions. Would continue with regular solids and thin liquids as tolerated.    TODAY'S TREATMENT:                                                                                                                                         DATE:  07/23/23: Reviewed MBSS results, education on recommendations to address ongoing complaints. Rec pt use regular liquid wash, take pills with applesauce, slow rate and full mastication of hard solids, and potential f/u with GI MD to determine is swallow complaints are esophageal in nature. Recommend ST to address dysarthria, pt scheduled for screens in Nov. Pt will plan to attend then so all therapies can be addressed at one time.   PATIENT EDUCATION: Education details: see above Person educated: Patient and Spouse Education method: Medical illustrator Education comprehension: verbalized understanding, returned demonstration, and needs further education   ASSESSMENT:  CLINICAL IMPRESSION: Patient is a 72 y.o. M who presents with mild dysphagia. Pt's swallow is c/b c/o globus sensation (pointing to area of larynx) with pills and hard solids. Pt reports problem ongoing for several months. Is using oatmeal to clear. Is not avoiding any foods. 3oz water challenge passed indicating low likelihood of prandial aspiration. Pt does have dx of PD, which can impact swallow function. Instrumental swallow study demonstrates WFL pharyngeal swallow and mild oral deficits. Pt complaints of globus do not appear to be related to pharyngeal function. Recommend  consideration for f/u with GI to further evaluate swallow function. Pt is agreeable to d/c, will plan to address dysarthria in Nov.   OBJECTIVE IMPAIRMENTS: include dysarthria and dysphagia. These impairments are limiting patient from effectively communicating at home and in community and safety when swallowing. Factors affecting potential to achieve goals and functional outcome are co-morbidities. Patient will benefit from skilled SLP services to address above impairments and improve overall function.  REHAB POTENTIAL: Good   GOALS: Goals reviewed with patient? Yes  SHORT TERM GOALS: Target date: 07/24/2023  Pt will complete MBSS Baseline: Goal status: MET  2.  Pt will teach back strategies/compensations to aid in improved swallow function/safety  Baseline:  Goal status: MET   LONG TERM GOALS: Target date: 08/27/2023  Pt will demonstrate dysphagia exercises (if indicated per MBSS) with 100% accuracy Baseline:  Goal status: deferred, not indicated  2.  Pt will report completion of HEP (if indicated per MBSS) 5/7 days over 1 week period Baseline:  Goal status: deferred, not indicated   PLAN:  SLP FREQUENCY: 2x/week  SLP DURATION: 8 weeks  PLANNED INTERVENTIONS: Aspiration precaution training, Pharyngeal strengthening exercises, Diet toleration management , Cueing hierachy, Internal/external aids, SLP instruction and feedback, Compensatory strategies, Patient/family education, and Re-evaluation  SPEECH THERAPY DISCHARGE SUMMARY  Visits from Start of Care: 2  Current functional level related to goals / functional outcomes: Demonstrated WFL pharyngeal swallow with mild oral deficits 2/2 PD via MBSS   Remaining deficits: Mild oral dysphagia, ongoing c/o globus sensation   Education / Equipment: Swallow strategies and compensations, evaluation results and recommendations, recommendation for ST to address dysarthria    Patient agrees to discharge. Patient goals were met.  Patient is being discharged due to  no deficits on MBSS.Maia Breslow, CCC-SLP 07/23/2023, 8:36 AM

## 2023-09-01 ENCOUNTER — Telehealth: Payer: Self-pay | Admitting: Family Medicine

## 2023-09-01 NOTE — Telephone Encounter (Signed)
Ok noted  

## 2023-09-01 NOTE — Telephone Encounter (Signed)
I know we discussed Edward Briggs last swallow study results, but I did receive other information from speech pathologist to consider a gastroenterology eval if continued issues with swallowing to determine if this could be esophageal.  If he is still having any difficulty with swallowing at this point, I would recommend GI eval.  Let me know and I am happy to place that order.  Thanks

## 2023-09-01 NOTE — Telephone Encounter (Signed)
Called and spoke to wife Gigi Gin, she reports he has beeing getting better slowly asked if she would like to proceed with GI referral she asked that we wait and see if he continues to improve and she will call us back if she changes her mind

## 2023-09-02 ENCOUNTER — Encounter: Payer: Self-pay | Admitting: Podiatry

## 2023-09-02 ENCOUNTER — Ambulatory Visit (INDEPENDENT_AMBULATORY_CARE_PROVIDER_SITE_OTHER): Payer: Medicare Other | Admitting: Podiatry

## 2023-09-02 DIAGNOSIS — M79674 Pain in right toe(s): Secondary | ICD-10-CM | POA: Diagnosis not present

## 2023-09-02 DIAGNOSIS — B351 Tinea unguium: Secondary | ICD-10-CM

## 2023-09-02 DIAGNOSIS — M79675 Pain in left toe(s): Secondary | ICD-10-CM

## 2023-09-02 DIAGNOSIS — E119 Type 2 diabetes mellitus without complications: Secondary | ICD-10-CM

## 2023-09-02 NOTE — Progress Notes (Signed)
  Subjective:  Patient ID: Edward Briggs, male    DOB: 1951-06-17,  MRN: [id number]  72 y.o. male presents to clinic with  preventative diabetic foot care and painful elongated mycotic toenails 1-5 bilaterally which are tender when wearing enclosed shoe gear. Pain is relieved with periodic professional debridement.  Patient states great toes are sore today. Chief Complaint  Patient presents with   Diabetes    A1C- BS- PCPV-   New problem(s): None   PCP is [name], MD. Theron Arista 06/18/2023.  No Known Allergies  Review of Systems: Negative except as noted in the HPI.   Objective:  [name] is a pleasant 72 y.o. male obese in NAD.[name]  Vascular Examination: Vascular status intact b/l with palpable pedal pulses. CFT immediate b/l. No edema. No pain with calf compression b/l. Skin temperature gradient WNL b/l. Pedal hair sparse.  Neurological Examination: Sensation grossly intact b/l with 10 gram monofilament. Vibratory sensation intact b/l.   Dermatological Examination: Pedal skin with normal turgor, texture and tone b/l. Toenails 2-5 b/l thick, discolored, elongated with subungual debris and pain on dorsal palpation. No hyperkeratotic lesions noted b/l.   Incurvated nailplate both borders of left hallux and both borders of right hallux with tenderness to palpation. No erythema, no edema, no drainage noted. No fluctuance.   Musculoskeletal Examination: Muscle strength 5/5 to b/l LE. HAV with bunion deformity noted b/l LE.  Radiographs: None  Last A1c:      Latest Ref Rng & Units 06/18/2023   11:38 AM 03/13/2023    9:27 AM  Hemoglobin A1C  Hemoglobin-A1c 4.6 - 6.5 % 7.6  7.3      Assessment:   1. Pain due to onychomycosis of toenails of both feet   2. Diabetes mellitus without complication (HCC)    Plan:  -Consent given for treatment as described below: -Examined patient. -Patient to continue soft, supportive shoe gear daily. -Toenails 1-5 b/l were debrided in  length and girth with sterile nail nippers and dremel without iatrogenic bleeding.  -Discussed chronicity of ingrown toenail(s) of bilateral great toes. Recommended patient consider having matrixectomy performed to alleviate chronic ingrown toenail(s). Discussed in-office procedure and post-procedure instructions. Patient agrees and will be referred to Dr. Sharl Ma for procedure. -No invasive procedure(s) performed. Offending nail border debrided and curretaged bilateral great toes utilizing sterile nail nipper and currette. Border(s) cleansed with alcohol and triple antibiotic ointment applied. Patient/POA/Caregiver/Facility instructed to apply Neosporin Cream  to bilateral great toes once daily for 7 days. Call office if there are any concerns. -Patient/POA to call should there be question/concern in the interim.  Return in about 3 months (around 12/03/2023).  Freddie Breech, DPM

## 2023-09-10 ENCOUNTER — Encounter: Payer: Self-pay | Admitting: Podiatry

## 2023-09-10 ENCOUNTER — Ambulatory Visit: Payer: Medicare Other | Admitting: Podiatry

## 2023-09-10 DIAGNOSIS — L600 Ingrowing nail: Secondary | ICD-10-CM | POA: Diagnosis not present

## 2023-09-10 NOTE — Progress Notes (Signed)
  Subjective:  Patient ID: Edward Briggs, male    DOB: 1951-10-15,  MRN: [id number]  Chief Complaint  Patient presents with   Toe Pain    PATIENT STATES THAT HE HAS PAIN AROUND HIS LEFT HALLUX , IT HAS BEEN LIKE THIS FOR A WHILE PATIENT STATES , PATIENT STATES HE IS NOT TAKING NO MEDICATION FOR PAIN .    72 y.o. male presents with the above complaint. History confirmed with patient.  He presents today for ingrown toenail removal on referral from Dr. Eloy End  Objective:  Physical Exam: warm, good capillary refill, no trophic changes or ulcerative lesions, normal DP and PT pulses, normal sensory exam, and ingrowing medial and lateral border of left hallux nail without infection.  Assessment:   1. Ingrowing left great toenail      Plan:  Patient was evaluated and treated and all questions answered.  We discussed the option of permanent partial matricectomy on the left great toenail.  We discussed the risk and benefits.  He had his A1c last checked in August and is due for a new check next week.  Previously has been in the 8% range although his last 1 was 7.6%.  I discussed with him that I would recommend waiting 2 weeks until we have his A1c results back if his A1c is less than 8% we can proceed with a lower risk of infection or nonhealing above 8% I would recommend waiting until it is further controlled.  Return in about 2 weeks (around 09/24/2023) for ingrown toenail removal (if A1c < 8%).

## 2023-09-10 NOTE — Patient Instructions (Signed)
Please let me know if your A1c is 8% or higher and we should cancel and reschedule your follow-up with me until your next A1c is checked in 3 months.  If it is less than 8% we can do the procedure in 2 weeks when I see

## 2023-09-15 ENCOUNTER — Encounter: Payer: Self-pay | Admitting: Pharmacist

## 2023-09-15 NOTE — Progress Notes (Signed)
Pharmacy Quality Measure Review  This patient is appearing on report for being at risk of failing the measure for Statin Use in Persons with Diabetes (SUPD) medications this calendar year.   Mr. Fretwell has atorvastatin on his med list but he has not filled yet in 2024.   I also noticed that he mentioned having trouble swallowing metformin tablets. There is a liquid metformin / Riomet that is tier 1 on his insurance and should be $0 Metformin suspension 500mg  / 5mL - he would take 10 mL = 1000mg  by mouth twice a day  /  = 30 days supply.   Mr. Mascia is scheduled to see you Friday, November 15th but if you prefer I can try to reach him before Friday to discuss atorvastatin and liquid metformin.   Henrene Pastor, PharmD Clinical Pharmacist [facility]  Population Health [phone]

## 2023-09-17 ENCOUNTER — Ambulatory Visit: Payer: Medicare Other | Admitting: Speech Pathology

## 2023-09-17 ENCOUNTER — Ambulatory Visit: Payer: Medicare Other | Attending: Neurology | Admitting: Occupational Therapy

## 2023-09-17 ENCOUNTER — Ambulatory Visit: Payer: Medicare Other | Admitting: Physical Therapy

## 2023-09-18 ENCOUNTER — Ambulatory Visit (INDEPENDENT_AMBULATORY_CARE_PROVIDER_SITE_OTHER): Payer: Medicare Other | Admitting: Family Medicine

## 2023-09-18 VITALS — BP 124/66 | HR 70 | Temp 98.1°F | Ht 73.0 in | Wt 246.4 lb

## 2023-09-18 DIAGNOSIS — R1310 Dysphagia, unspecified: Secondary | ICD-10-CM | POA: Diagnosis not present

## 2023-09-18 DIAGNOSIS — E785 Hyperlipidemia, unspecified: Secondary | ICD-10-CM | POA: Diagnosis not present

## 2023-09-18 DIAGNOSIS — E1169 Type 2 diabetes mellitus with other specified complication: Secondary | ICD-10-CM

## 2023-09-18 DIAGNOSIS — Z794 Long term (current) use of insulin: Secondary | ICD-10-CM | POA: Diagnosis not present

## 2023-09-18 DIAGNOSIS — W19XXXA Unspecified fall, initial encounter: Secondary | ICD-10-CM | POA: Diagnosis not present

## 2023-09-18 DIAGNOSIS — Y92009 Unspecified place in unspecified non-institutional (private) residence as the place of occurrence of the external cause: Secondary | ICD-10-CM | POA: Diagnosis not present

## 2023-09-18 DIAGNOSIS — E1165 Type 2 diabetes mellitus with hyperglycemia: Secondary | ICD-10-CM | POA: Diagnosis not present

## 2023-09-18 DIAGNOSIS — S7001XA Contusion of right hip, initial encounter: Secondary | ICD-10-CM | POA: Diagnosis not present

## 2023-09-18 DIAGNOSIS — I10 Essential (primary) hypertension: Secondary | ICD-10-CM | POA: Diagnosis not present

## 2023-09-18 LAB — COMPREHENSIVE METABOLIC PANEL
ALT: 4 U/L (ref 0–53)
AST: 11 U/L (ref 0–37)
Albumin: 4 g/dL (ref 3.5–5.2)
Alkaline Phosphatase: 58 U/L (ref 39–117)
BUN: 16 mg/dL (ref 6–23)
CO2: 28 meq/L (ref 19–32)
Calcium: 9.4 mg/dL (ref 8.4–10.5)
Chloride: 103 meq/L (ref 96–112)
Creatinine, Ser: 0.93 mg/dL (ref 0.40–1.50)
GFR: 82.38 mL/min (ref >60.00)
Glucose, Bld: 155 mg/dL — ABNORMAL HIGH (ref 70–99)
Potassium: 4.3 meq/L (ref 3.5–5.1)
Sodium: 139 meq/L (ref 135–145)
Total Bilirubin: 0.8 mg/dL (ref 0.2–1.2)
Total Protein: 7.6 g/dL (ref 6.0–8.3)

## 2023-09-18 LAB — LIPID PANEL
Cholesterol: 144 mg/dL (ref 0–200)
HDL: 40.9 mg/dL (ref >39.00)
LDL Cholesterol: 87 mg/dL (ref 0–99)
NonHDL: 102.97
Total CHOL/HDL Ratio: 4
Triglycerides: 79 mg/dL (ref 0.0–149.0)
VLDL: 15.8 mg/dL (ref 0.0–40.0)

## 2023-09-18 LAB — HEMOGLOBIN A1C: Hgb A1c MFr Bld: 7.8 % — ABNORMAL HIGH (ref 4.6–6.5)

## 2023-09-18 MED ORDER — METFORMIN HCL ER 500 MG/5ML PO SRER
10.0000 mL | Freq: Two times a day (BID) | ORAL | 3 refills | Status: DC
Start: 2023-09-18 — End: 2023-09-22

## 2023-09-18 NOTE — Progress Notes (Unsigned)
Subjective:  Patient ID: Edward Briggs, male    DOB: 1950/11/14  Age: 72 y.o. MRN: [id number]  CC:  Chief Complaint  Patient presents with  . Medical Management of Chronic Issues    Pt is doing well notes no questions at this time     HPI Edward Briggs presents for follow up. Here with spouse.    Parkinson's, with history of fall: Under care of neurology with use of carbidopa, levodopa, aricept.  Fall in October, wearing socks without grippers in kitchen slipped when turned. No LOC/preceding near syncopal sx's. Fell onto right side. No head injury. Sore on outside of R hip, no other injuries. No bruising or wounds. No pain with weightbearing on leg, able to walk without pain, just sore to press on outside of hip. Only minimal soreness now - better.   Pill dysphagia Discussed in August.  Had been recently diagnosed with Parkinson's disease, followed by neurology.  Was having difficulty swallowing larger pills, metformin dosage was decreased to 500 mg with 2 previous but still some difficulty with swallowing.  Eventually pills would pass but felt like they got stuck initially.  He was referred to speech therapy, with modified barium swallow study on September 16.  Mild oral dysphagia but with functional overall swallow.  Question of globus sensation being more esophageal in nature.  Call placed to patient, spouse to discuss gastroenterology eval October 29, but as he was improving that was deferred at that time.   Recently pharmacy medication review on November 12.  Option of metformin liquid.  10 mL twice daily that may be tolerated better. Additionally question of atorvastatin use.  No recent fill. Less difficult to swallow pills, metformin is main issue now - others ok.    Diabetes: Insulin-dependent complicated by hyperglycemia.  Slight elevated A1c in August.  Was taking 34 units Lantus per day at that time.  Metformin 1000 mg twice daily.  No symptomatic lows with home readings 10  9-1 20 range at his August 15 visit. Currently taking 34 units per day.  Home readings - 120-130. No symptomatic lows.  Lipitor 10 mg has been prescribed.  See above. Microalbumin: Normal ratio 03/13/2023.  He is on lisinopril 5 mg Optho, foot exam, pneumovax:  Optho - due. Has optho - needs to schedule appointment.   Lab Results  Component Value Date   HGBA1C 7.6 (H) 06/18/2023   HGBA1C 7.3 (H) 03/13/2023   HGBA1C 8.1 (H) 07/03/2022   Lab Results  Component Value Date   MICROALBUR <0.7 03/13/2023   LDLCALC 69 03/13/2023   CREATININE 0.86 06/18/2023      History Patient Active Problem List   Diagnosis Date Noted  . HTN (hypertension) 07/22/2019  . Acute metabolic encephalopathy 07/22/2019  . AKI (acute kidney injury) (HCC) 07/22/2019  . Sepsis (HCC) 07/22/2019  . UTI (urinary tract infection) 07/22/2019  . Diabetes mellitus without complication (HCC) 09/10/2012  . CHEST PAIN 07/21/2009   Past Medical History:  Diagnosis Date  . Diabetes mellitus without complication (HCC)   . GERD (gastroesophageal reflux disease)    no meds  . Hypertension    controlled on meds  . 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   Past Surgical History:  Procedure Laterality Date  . ABSCESS DRAINAGE    . COLONOSCOPY     ~ 10 yrs ago  . FRACTURE SURGERY    . HARDWARE REMOVAL  03/26/2012   Procedure: HARDWARE REMOVAL;  Surgeon: Sherri Rad,  MD;  Location: [facility];  Service: Orthopedics;  Laterality: Left;  hardware removal deep left ankle syndesmotic screws only and stress xrays ankle   . ORIF ANKLE FRACTURE  12/03/2011   Procedure: OPEN REDUCTION INTERNAL FIXATION (ORIF) ANKLE FRACTURE;  Surgeon: Sherri Rad, MD;  Location: [facility];  Service: Orthopedics;  Laterality: Left;  left medial malleolus fracture  . PILONIDAL CYST EXCISION  ~1982  . SKIN GRAFT  ~1970   Lt great toe (graft from Lt thigh)   No Known Allergies Prior to Admission medications    Medication Sig Start Date End Date Taking? Authorizing Provider  Accu-Chek Softclix Lancets lancets USE AS DIRECTED UP TO 4 TIMES DAILY 01/22/22  Yes [name], MD  Ascorbic Acid (VITAMIN C PO) Take 1 tablet by mouth daily.   Yes [provider]  aspirin 81 MG tablet Take 81 mg by mouth daily.   Yes [provider]  atorvastatin (LIPITOR) 10 MG tablet Take 1 tablet (10 mg total) by mouth daily. 03/13/23  Yes [name], MD  blood glucose meter kit and supplies Dispense based on patient and insurance preference. Use up to four times daily as directed. (FOR ICD-10 E10.9, E11.9). 11/25/21  Yes [name], MD  Blood Glucose Monitoring Suppl (BLOOD GLUCOSE METER) kit Use as instructed 09/10/12  Yes Peyton Najjar, MD  carbidopa-levodopa (SINEMET IR) 25-100 MG tablet Take 1 tablet by mouth 4 (four) times daily. 03/05/23 02/28/24 Yes Camara, Amalia Hailey, MD  donepezil (ARICEPT) 10 MG tablet Take 1 tablet (10 mg total) by mouth at bedtime. 12/01/22 11/26/23 Yes Camara, Amalia Hailey, MD  glucose blood test strip Check blood sugar 3 times daily 11/25/21  Yes [name], MD  Insulin Pen Needle (BD PEN NEEDLE NANO U/F) 32G X 4 MM MISC USE DAILY AS DIRECTED 03/28/21  Yes [name], MD  LANTUS SOLOSTAR 100 UNIT/ML Solostar Pen INJECT 34 UNITS SUBCUTANEOUSLY ONCE DAILY 03/13/23  Yes [name], MD  lisinopril (ZESTRIL) 5 MG tablet Take 1 tablet by mouth once daily 07/13/23  Yes [name], MD  metFORMIN (GLUCOPHAGE) 500 MG tablet Take 2 tablets (1,000 mg total) by mouth 2 (two) times daily with a meal. 06/15/23  Yes [name], MD   Social History   Socioeconomic History  . Marital status: Married    Spouse name: Not on file  . Number of children: 2  . Years of education: Not on file  . Highest education level: Not on file  Occupational History  . Occupation: retired  Tobacco Use  . Smoking status: Former    Current packs/day: 0.00    Types:  Cigarettes    Quit date: 10/30/1988    Years since quitting: 34.9  . Smokeless tobacco: Never  Vaping Use  . Vaping status: Never Used  Substance and Sexual Activity  . Alcohol use: Yes    Alcohol/week: 1.0 standard drink of alcohol    Types: 1 Shots of liquor per week    Comment: every two weeks  . Drug use: No  . Sexual activity: Yes    Birth control/protection: None  Other Topics Concern  . Not on file  Social History Narrative  . Not on file   Social Determinants of Health   Financial Resource Strain: Low Risk  (03/25/2023)   Overall Financial Resource Strain (CARDIA)   . Difficulty of Paying Living Expenses: Not hard at all  Food Insecurity: No Food  Insecurity (03/25/2023)   Hunger Vital Sign   . Worried About Programme researcher, broadcasting/film/video in the Last Year: Never true   . Ran Out of Food in the Last Year: Never true  Transportation Needs: No Transportation Needs (03/25/2023)   PRAPARE - Transportation   . Lack of Transportation (Medical): No   . Lack of Transportation (Non-Medical): No  Physical Activity: Inactive (03/25/2023)   Exercise Vital Sign   . Days of Exercise per Week: 0 days   . Minutes of Exercise per Session: 0 min  Stress: No Stress Concern Present (03/25/2023)   [employer] of Occupational Health - Occupational Stress Questionnaire   . Feeling of Stress : Not at all  Social Connections: Moderately Integrated (03/25/2023)   Social Connection and Isolation Panel [NHANES]   . Frequency of Communication with Friends and Family: Twice a week   . Frequency of Social Gatherings with Friends and Family: Once a week   . Attends Religious Services: More than 4 times per year   . Active Member of Clubs or Organizations: No   . Attends Banker Meetings: Never   . Marital Status: Married  Catering manager Violence: Not At Risk (03/25/2023)   Humiliation, Afraid, Rape, and Kick questionnaire   . Fear of Current or Ex-Partner: No   . Emotionally Abused: No    . Physically Abused: No   . Sexually Abused: No    Review of Systems   Objective:   Vitals:   09/18/23 1036  BP: 124/66  Pulse: 70  Temp: 98.1 F (36.7 C)  TempSrc: Temporal  SpO2: 98%  Weight: 246 lb 6.4 oz (111.8 kg)  Height: 6\' 1"  (1.854 m)     Physical Exam     Assessment & Plan:  Edward Briggs is a 72 y.o. male . No diagnosis found.   No orders of the defined types were placed in this encounter.  There are no Patient Instructions on file for this visit.    Signed,   Meredith Staggers, MD [facility], [facility] 09/18/23 10:42 AM

## 2023-09-18 NOTE — Patient Instructions (Addendum)
Wear socks with grippers, see other fall prevention recommendations at home.  If any further falls we need to discuss other home safety and prevention. You likely had a bruise of your side/hip. That should continue to improve. If any continued soreness past next week or two, let me know and we can check xrays. Return to the clinic or go to the nearest emergency room if any of your symptoms worsen or new symptoms occur.   You should be taking atorvastatin once per day for cholesterol. Let me know if you do not have that at home. I will reorder it today. Depending on labs we can discuss other changes, but try the liquid metformin 10ml twice per day for now in place of the pills.  If any continued issues with swallowing pills, I do recommend following up with gastroenterology.  Let me know and I will place that referral.  Call your eye doctor for appointment and diabetes eye screening.   Take care!  Fall Prevention in the Home, Adult Falls can cause injuries and affect people of all ages. There are many simple things that you can do to make your home safe and to help prevent falls. If you need it, ask for help making these changes. What actions can I take to prevent falls? General information Use good lighting in all rooms. Make sure to: Replace any light bulbs that burn out. Turn on lights if it is dark and use night-lights. Keep items that you use often in easy-to-reach places. Lower the shelves around your home if needed. Move furniture so that there are clear paths around it. Do not keep throw rugs or other things on the floor that can make you trip. If any of your floors are uneven, fix them. Add color or contrast paint or tape to clearly mark and help you see: Grab bars or handrails. First and last steps of staircases. Where the edge of each step is. If you use a ladder or stepladder: Make sure that it is fully opened. Do not climb a closed ladder. Make sure the sides of the ladder are  locked in place. Have someone hold the ladder while you use it. Know where your pets are as you move through your home. What can I do in the bathroom?     Keep the floor dry. Clean up any water that is on the floor right away. Remove soap buildup in the bathtub or shower. Buildup makes bathtubs and showers slippery. Use non-skid mats or decals on the floor of the bathtub or shower. Attach bath mats securely with double-sided, non-slip rug tape. If you need to sit down while you are in the shower, use a non-slip stool. Install grab bars by the toilet and in the bathtub and shower. Do not use towel bars as grab bars. What can I do in the bedroom? Make sure that you have a light by your bed that is easy to reach. Do not use any sheets or blankets on your bed that hang to the floor. Have a firm bench or chair with side arms that you can use for support when you get dressed. What can I do in the kitchen? Clean up any spills right away. If you need to reach something above you, use a sturdy step stool that has a grab bar. Keep electrical cables out of the way. Do not use floor polish or wax that makes floors slippery. What can I do with my stairs? Do not leave anything on  the stairs. Make sure that you have a light switch at the top and the bottom of the stairs. Have them installed if you do not have them. Make sure that there are handrails on both sides of the stairs. Fix handrails that are broken or loose. Make sure that handrails are as long as the staircases. Install non-slip stair treads on all stairs in your home if they do not have carpet. Avoid having throw rugs at the top or bottom of stairs, or secure the rugs with carpet tape to prevent them from moving. Choose a carpet design that does not hide the edge of steps on the stairs. Make sure that carpet is firmly attached to the stairs. Fix any carpet that is loose or worn. What can I do on the outside of my home? Use bright outdoor  lighting. Repair the edges of walkways and driveways and fix any cracks. Clear paths of anything that can make you trip, such as tools or rocks. Add color or contrast paint or tape to clearly mark and help you see high doorway thresholds. Trim any bushes or trees on the main path into your home. Check that handrails are securely fastened and in good repair. Both sides of all steps should have handrails. Install guardrails along the edges of any raised decks or porches. Have leaves, snow, and ice cleared regularly. Use sand, salt, or ice melt on walkways during winter months if you live where there is ice and snow. In the garage, clean up any spills right away, including grease or oil spills. What other actions can I take? Review your medicines with your health care provider. Some medicines can make you confused or feel dizzy. This can increase your chance of falling. Wear closed-toe shoes that fit well and support your feet. Wear shoes that have rubber soles and low heels. Use a cane, walker, scooter, or crutches that help you move around if needed. Talk with your provider about other ways that you can decrease your risk of falls. This may include seeing a physical therapist to learn to do exercises to improve movement and strength. Where to find more information Centers for Disease Control and Prevention, STEADI: TonerPromos.no [employer] on Aging: BaseRingTones.pl National Institute on Aging: BaseRingTones.pl Contact a health care provider if: You are afraid of falling at home. You feel weak, drowsy, or dizzy at home. You fall at home. Get help right away if you: Lose consciousness or have trouble moving after a fall. Have a fall that causes a head injury. These symptoms may be an emergency. Get help right away. Call 911. Do not wait to see if the symptoms will go away. Do not drive yourself to the hospital. This information is not intended to replace advice given to you by your health care  provider. Make sure you discuss any questions you have with your health care provider. Document Revised: 06/23/2022 Document Reviewed: 06/23/2022 Elsevier Patient Education  2024 [employer].

## 2023-09-19 ENCOUNTER — Other Ambulatory Visit: Payer: Self-pay | Admitting: Family Medicine

## 2023-09-19 DIAGNOSIS — E1165 Type 2 diabetes mellitus with hyperglycemia: Secondary | ICD-10-CM

## 2023-09-20 ENCOUNTER — Encounter: Payer: Self-pay | Admitting: Family Medicine

## 2023-09-21 NOTE — Telephone Encounter (Signed)
Per Pharmacy note "Medication not available. Riomet suspension has been d/c'd with no generic. Please consider metoformin solution. thanks! "  Please advise

## 2023-09-22 NOTE — Telephone Encounter (Signed)
New order placed

## 2023-09-24 ENCOUNTER — Ambulatory Visit: Payer: Medicare Other | Admitting: Podiatry

## 2023-09-24 DIAGNOSIS — L600 Ingrowing nail: Secondary | ICD-10-CM

## 2023-09-24 MED ORDER — CEPHALEXIN 500 MG PO CAPS: 500.0000 mg | ORAL_CAPSULE | Freq: Three times a day (TID) | ORAL | 0 refills | Status: DC

## 2023-09-24 MED ORDER — NEOMYCIN-POLYMYXIN-HC 3.5-10000-1 OT SUSP: OTIC | 0 refills | Status: DC

## 2023-09-24 NOTE — Patient Instructions (Addendum)

## 2023-09-24 NOTE — Progress Notes (Signed)
  Subjective:  Patient ID: Edward Briggs, male    DOB: 1951/07/19,  MRN: [id number]  Chief Complaint  Patient presents with   Ingrown Toenail    RM22: patient is here for ingrown nail removal- patients A1C is the same    72 y.o. male presents with the above complaint. History confirmed with patient.  He returns for follow-up for ingrown toenail removal he had his A1c checked and it is 7.8% Objective:  Physical Exam: warm, good capillary refill, no trophic changes or ulcerative lesions, normal DP and PT pulses, normal sensory exam, and ingrowing medial and lateral border of left hallux nail without infection.  Assessment:   1. Ingrowing left great toenail       Plan:  Patient was evaluated and treated and all questions answered.    Ingrown Nail, left -Patient elects to proceed with minor surgery to remove ingrown toenail today. Consent reviewed and signed by patient.  We discussed the risk of proceeding and possible risk of infection or nonhealing.  He understands the risk and wishes to proceed. -Ingrown nail excised. See procedure note. -Educated on post-procedure care including soaking. Written instructions provided and reviewed. -Rx for Cortisporin sent to pharmacy.  Rx for Keflex 5 days sent to pharmacy -Advised on signs and symptoms of infection developing.  We discussed that the phenol likely will create some redness and edema and tenderness around the nailbed as long as it is localized this is to be expected.  Will return as needed if any infection signs develop  Procedure: Excision of Ingrown Toenail Location: Left 1st toe  medial and lateral  nail borders. Anesthesia: Lidocaine 1% plain; 1.5 mL and Marcaine 0.5% plain; 1.5 mL, digital block. Skin Prep: Betadine. Dressing: Silvadene; telfa; dry, sterile, compression dressing. Technique: Following skin prep, the toe was exsanguinated and a tourniquet was secured at the base of the toe. The affected nail border was freed,  split with a nail splitter, and excised. Chemical matrixectomy was then performed with phenol and irrigated out with alcohol. The tourniquet was then removed and sterile dressing applied. Disposition: Patient tolerated procedure well.   Return in about 3 weeks (around 10/15/2023) for nail re-check.

## 2023-09-25 ENCOUNTER — Other Ambulatory Visit: Payer: Self-pay | Admitting: Pharmacist

## 2023-09-25 NOTE — Progress Notes (Signed)
09/25/2023 Name: Edward Briggs MRN: [id number] DOB: 04/21/1951  Chief Complaint  Patient presents with   Medication Adherence    Edward Briggs is a 72 y.o. year old male who presented for a telephone visit.   They were referred to the pharmacist by their PCP for assistance in managing  medications and medication adherence .    Subjective: Edward Briggs was appearing on a report for being at risk of failing the adherence measure for diabetes medications this calendar year.   Medication: Metformin   Patient had been having difficulty swallowing metformin tablets. After sending Dr Neva Seat a message to consider metformin liquid Dr Amanda Cockayne changed at last visit 09/18/2023. Patient states today the the liquid metformin has been tolerable and so much easier to swallow that the tablets.   This patient was also appearing on report for being at risk of failing the measure for Statin Use in Persons with Diabetes (SUPD) medications this calendar year.   He has atorvastatin 10mg  daily on his medication list but has not been filled since 2023. Last LDL was 87  The 10-year ASCVD risk score (Arnett DK, et al., 2019) is: 31.4%   Values used to calculate the score:     Age: 43 years     Sex: Male     Is Non-Hispanic African American: Yes     Diabetic: Yes     Tobacco smoker: No     Systolic Blood Pressure: 124 mmHg     Is BP treated: Yes     HDL Cholesterol: 40.9 mg/dL     Total Cholesterol: 144 mg/dL    Medication Access/Adherence  Current Pharmacy:  [id number] - [place] ([place]), [place] - [address] [place] ([place]) [place] [address] Phone: [phone] Fax: [phone]  [id number] - [place], [place] - [address] AT [facility] [address]  [place] [address] Phone: [phone] Fax: [phone]    Objective:  Lab Results  Component Value Date   HGBA1C 7.8 (H) 09/18/2023    Lab Results   Component Value Date   CREATININE 0.93 09/18/2023   BUN 16 09/18/2023   NA 139 09/18/2023   K 4.3 09/18/2023   CL 103 09/18/2023   CO2 28 09/18/2023    Lab Results  Component Value Date   CHOL 144 09/18/2023   HDL 40.90 09/18/2023   LDLCALC 87 09/18/2023   TRIG 79.0 09/18/2023   CHOLHDL 4 09/18/2023    Medications Reviewed Today     Reviewed by Henrene Pastor, RPH-CPP (Pharmacist) on 09/25/23 at 1534  Med List Status: <None>   Medication Order Taking? Sig Documenting Provider Last Dose Status Informant  Accu-Chek Softclix Lancets lancets [id number]  USE AS DIRECTED UP TO 4 TIMES DAILY [name], MD  Active   Ascorbic Acid (VITAMIN C PO) [id number]  Take 1 tablet by mouth daily. [provider]  Active Spouse/Significant Other  aspirin 81 MG tablet [id number]  Take 81 mg by mouth daily. [provider]  Active Spouse/Significant Other  atorvastatin (LIPITOR) 10 MG tablet [id number] No Take 1 tablet (10 mg total) by mouth daily.  Patient not taking: Reported on 09/25/2023   [name], MD Not Taking Active   blood glucose meter kit and supplies [id number]  Dispense based on patient and insurance preference. Use up to four times daily as directed. (FOR ICD-10 E10.9, E11.9). [name], MD  Active   Blood  Glucose Monitoring Suppl (BLOOD GLUCOSE METER) kit [id number]  Use as instructed Peyton Najjar, MD  Active Spouse/Significant Other  carbidopa-levodopa (SINEMET IR) 25-100 MG tablet [id number] Yes Take 1 tablet by mouth 4 (four) times daily. Windell Norfolk, MD Taking Active   cephALEXin (KEFLEX) 500 MG capsule [id number] Yes Take 1 capsule (500 mg total) by mouth 3 (three) times daily. Edwin Cap, DPM Taking Active   donepezil (ARICEPT) 10 MG tablet [id number] Yes Take 1 tablet (10 mg total) by mouth at bedtime. Windell Norfolk, MD Taking Active   glucose blood test strip [id number]  Check blood sugar 3 times daily [name], MD  Active    Insulin Pen Needle (BD PEN NEEDLE NANO U/F) 32G X 4 MM MISC [id number]  USE DAILY AS DIRECTED [name], MD  Active   LANTUS SOLOSTAR 100 UNIT/ML Solostar Pen [id number] Yes INJECT 34 UNITS SUBCUTANEOUSLY ONCE DAILY [name], MD Taking Active   lisinopril (ZESTRIL) 5 MG tablet [id number] Yes Take 1 tablet by mouth once daily [name], MD Taking Active   Metformin HCl 500 MG/5ML SOLN [id number] Yes Take 10 mLs (1,000 mg total) by mouth 2 (two) times daily. [name], MD Taking Active   neomycin-polymyxin-hydrocortisone (CORTISPORIN) 3.5-10000-1 OTIC suspension [id number]  Apply 1-2 drops daily after soaking and cover with bandaid Edwin Cap, DPM  Active   Med List Note Erich Montane 03/24/12 1230): pensaid gel topical              Assessment/Plan:   Diabetes: - Improved tolerance of metformin liquid. Patient will continue to take 10 mL = 1000ng twice a day  - Discussed benefits of statin therapy for patient's with DM in prevention of CVD. Patient agrees to restart atorvastaitn 10mg  daily. He will let me know if he has any problems swallowing atorvastaitn 10mg  - usually is a small tablet but depends on manufacturer used. With patients permission requested refill from [facility].   Henrene Pastor, PharmD Clinical Pharmacist [facility]  Population Health [phone]

## 2023-10-07 ENCOUNTER — Other Ambulatory Visit: Payer: Self-pay | Admitting: Family Medicine

## 2023-10-07 DIAGNOSIS — I10 Essential (primary) hypertension: Secondary | ICD-10-CM

## 2023-10-15 ENCOUNTER — Ambulatory Visit: Payer: Medicare Other | Admitting: Podiatry

## 2023-10-15 DIAGNOSIS — L600 Ingrowing nail: Secondary | ICD-10-CM

## 2023-10-15 NOTE — Progress Notes (Signed)
  Subjective:  Patient ID: Edward Briggs, male    DOB: 1951-04-20,  MRN: [id number]  Chief Complaint  Patient presents with   Nail Problem    Pt presents for ingrown toenail re-check states he is doing well.     DOS: 09/24/2023 Procedure: Left hallux medial and lateral partial matricectomy  72 y.o. male returns for post-op check.  States is doing well not having any pain  Review of Systems: Negative except as noted in the HPI. Denies N/V/F/Ch.   Objective:  There were no vitals filed for this visit. There is no height or weight on file to calculate BMI. Constitutional Well developed. Well nourished.  Vascular Foot warm and well perfused. Capillary refill normal to all digits.  Calf is soft and supple, no posterior calf or knee pain, negative Homans' sign  Neurologic Normal speech. Oriented to person, place, and time. Epicritic sensation to light touch grossly present bilaterally.  Dermatologic Skin healing well without signs of infection. No recurrence of ingrown  Orthopedic: Tenderness to palpation noted about the surgical site.   Assessment:   1. Ingrowing left great toenail    Plan:  Patient was evaluated and treated and all questions answered.  S/p foot surgery left -Progressing as expected post-operatively. -Debrided the scab and surrounding skin to remove nonviable tissue appears to be fairly well-healed and should be able to leave open to air and discontinue soaks and ointment at this point.  Return to see me as needed. Return if symptoms worsen or fail to improve.

## 2023-11-09 ENCOUNTER — Other Ambulatory Visit: Payer: Self-pay | Admitting: Family Medicine

## 2023-11-09 DIAGNOSIS — E1165 Type 2 diabetes mellitus with hyperglycemia: Secondary | ICD-10-CM

## 2023-11-17 ENCOUNTER — Ambulatory Visit: Payer: Medicare Other | Admitting: Physical Therapy

## 2023-11-17 ENCOUNTER — Ambulatory Visit: Payer: Medicare Other | Attending: Neurology

## 2023-11-17 ENCOUNTER — Telehealth: Payer: Self-pay

## 2023-11-17 ENCOUNTER — Other Ambulatory Visit: Payer: Self-pay | Admitting: Neurology

## 2023-11-17 ENCOUNTER — Ambulatory Visit: Payer: Medicare Other | Admitting: Occupational Therapy

## 2023-11-17 ENCOUNTER — Telehealth: Payer: Self-pay | Admitting: Occupational Therapy

## 2023-11-17 DIAGNOSIS — R293 Abnormal posture: Secondary | ICD-10-CM

## 2023-11-17 DIAGNOSIS — R41840 Attention and concentration deficit: Secondary | ICD-10-CM | POA: Insufficient documentation

## 2023-11-17 DIAGNOSIS — R2681 Unsteadiness on feet: Secondary | ICD-10-CM | POA: Insufficient documentation

## 2023-11-17 DIAGNOSIS — R29818 Other symptoms and signs involving the nervous system: Secondary | ICD-10-CM | POA: Insufficient documentation

## 2023-11-17 DIAGNOSIS — R278 Other lack of coordination: Secondary | ICD-10-CM

## 2023-11-17 DIAGNOSIS — R471 Dysarthria and anarthria: Secondary | ICD-10-CM | POA: Insufficient documentation

## 2023-11-17 DIAGNOSIS — G20A1 Parkinson's disease without dyskinesia, without mention of fluctuations: Secondary | ICD-10-CM

## 2023-11-17 DIAGNOSIS — M5459 Other low back pain: Secondary | ICD-10-CM | POA: Insufficient documentation

## 2023-11-17 DIAGNOSIS — M6281 Muscle weakness (generalized): Secondary | ICD-10-CM | POA: Insufficient documentation

## 2023-11-17 DIAGNOSIS — R498 Other voice and resonance disorders: Secondary | ICD-10-CM

## 2023-11-17 DIAGNOSIS — R41842 Visuospatial deficit: Secondary | ICD-10-CM | POA: Insufficient documentation

## 2023-11-17 DIAGNOSIS — Z9181 History of falling: Secondary | ICD-10-CM | POA: Insufficient documentation

## 2023-11-17 DIAGNOSIS — R41841 Cognitive communication deficit: Secondary | ICD-10-CM | POA: Insufficient documentation

## 2023-11-17 DIAGNOSIS — R2689 Other abnormalities of gait and mobility: Secondary | ICD-10-CM | POA: Insufficient documentation

## 2023-11-17 NOTE — Telephone Encounter (Signed)
 Will do. Thanks.

## 2023-11-17 NOTE — Telephone Encounter (Signed)
 Dr. Levora, Mr. Denio was screened by OT, PT, and ST on 11/17/2023.  The patient would benefit from orders for OT, PT, and ST evaluations for Parkinson's disease due to decline in function.    If you agree, please place an order to [facility] on [address] via EPIC or fax the order to [phone]. Thank you,  Jocelyn Bottom, OTR/L 11/17/2023 Occupational Therapy  [facility] [address] [place], [place]  [address]  Phone: [phone] Fax: [phone]

## 2023-11-17 NOTE — Telephone Encounter (Signed)
 Dr. Gregg,  Bretta C. Liller was screened by PT/OT/ST on 11/17/23 for progression of Parkinson's Disease.  The patient would benefit from PT eval/treat for impaired balance, OT eval/treat for fine motor deficits, and ST eval/treat for dysarthria resulting in overall decline in function.     If you agree, please place an order to [facility] on [address] via EPIC or fax the order to [phone].  Thank you, Comer Louder, MA [facility] [address] [place], [place]  [address] Phone:  [phone] Fax:  [phone]

## 2023-11-17 NOTE — Therapy (Signed)
 [facility] [address] [place], [place], [address] Phone: [phone]   Fax:  [phone]  Patient Details  Name: Edward Briggs MRN: [id number] Date of Birth: July 20, 1951 Referring Provider:  Levora Reyes SAUNDERS, MD  Encounter Date: 11/17/2023  Occupational Therapy Parkinson's Disease Screen  Hand dominance:  Right   Physical Performance Test item #2 (simulated eating):  29 sec 02/20/23: 12 seconds  Physical Performance Test item #4 (donning/doffing jacket):  1:18 off  sec 02/20/23: 36 seconds   Fastening/unfastening 3 buttons in:  no buttons buttoned or unbuttoned in 47 sec 02/20/23: 47 seconds seated   9-hole peg test:    RUE  64 sec        LUE  53 sec 02/20/23: Right 34, Left 32   Box & Blocks Test:   RUE  16 blocks        LUE  27 blocks 02/20/23: Right hand 29 blocks    Change in ability to perform ADLs/IADLs:  Pt's wife is assisting more  Other Comments:  Impaired spatial relations and insight into task completion.   Pt would benefit from occupational therapy evaluation due to  significant decline in functional scores.    Jocelyn CHRISTELLA Bottom, OT 11/17/2023, 10:28 AM  [facility] [address] [place], [place], [address] Phone: [phone]   Fax:  [phone]

## 2023-11-17 NOTE — Therapy (Signed)
 [facility] [address] [place], [place], [address] Phone: [phone]   Fax:  [phone]  Patient Details  Name: Edward Briggs MRN: [id number] Date of Birth: 10-Dec-1950 Referring Provider:  Levora Reyes SAUNDERS, MD  Encounter Date: 11/17/2023  Physical Therapy Parkinson's Disease Screen   Timed Up and Go test:17.31s no AD (previously 15s)  10 meter walk test:32.8' over 12.44s = 2.64 ft/s no AD (previously 3.28 ft/s)   5 time sit to stand test:20.41s no UE support (previously 11.6s no UE support)   Pt reports he has had one fall, slipped in his kitchen while wearing slick socks. Denies injuries and was able to get up on his own. Wife reports pt has slowed down quite a bit and pt did require min A while assessing due to catching R foot and tripping. Patient would benefit from Physical Therapy evaluation due to decreased speed, falls and impaired balance    Marlon BRAVO Rushton Early, PT, DPT 11/17/2023, 10:17 AM  [facility] [address] [place], [place], [address] Phone: [phone]   Fax:  [phone]

## 2023-11-17 NOTE — Therapy (Signed)
 [facility] [address] [place], [place], [address] Phone: [phone]   Fax:  [phone]  Patient Details  Name: Edward Briggs MRN: [id number] Date of Birth: 1951/07/31 Referring Provider:  Gregg Reyes SAUNDERS MD  Encounter Date: 11/17/2023  Speech Therapy Parkinson's Disease Screen   Decibel Level today: mid fading to low 60s dB  (WNL=70-72 dB) with sound level meter 30cm away from pt's mouth. Pt's conversational volume has decreased since last treatment course. Wife reported she sometimes asks him to repeat d/t low volume.   Pt does not report difficulty with swallowing or cognition, which does not warrant further evaluation.  Pt would benefit from speech-language eval for dysarthria - please order via EPIC or call [phone] to schedule  Comer LILLETTE Louder, CCC-SLP 11/17/2023, 10:24 AM  [facility] [address] [place], [place], [address] Phone: [phone]   Fax:  [phone]

## 2023-11-18 ENCOUNTER — Other Ambulatory Visit: Payer: Self-pay | Admitting: Neurology

## 2023-11-18 NOTE — Therapy (Signed)
OUTPATIENT OCCUPATIONAL THERAPY PARKINSON'S EVALUATION  Patient Name: Edward Briggs MRN: [id number] DOB:09-27-1951, 73 y.o., male Today's Date: 11/23/2023  PCP: [name], MD REFERRING PROVIDER: Windell Norfolk, MD   END OF SESSION:  OT End of Session - 11/23/23 1506     Visit Number 1    Number of Visits 16    Date for OT Re-Evaluation 01/21/24    Authorization Type [employer] Medicare    Progress Note Due on Visit 10    OT Start Time 1400    OT Stop Time 1445    OT Time Calculation (min) 45 min    Activity Tolerance Patient tolerated treatment well    Behavior During Therapy WFL for tasks assessed/performed             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   Past Surgical History:  Procedure Laterality Date   ABSCESS DRAINAGE     COLONOSCOPY     ~ 10 yrs ago   FRACTURE SURGERY     HARDWARE REMOVAL  03/26/2012   Procedure: HARDWARE REMOVAL;  Surgeon: Sherri Rad, MD;  Location: [facility];  Service: Orthopedics;  Laterality: Left;  hardware removal deep left ankle syndesmotic screws only and stress xrays ankle    ORIF ANKLE FRACTURE  12/03/2011   Procedure: OPEN REDUCTION INTERNAL FIXATION (ORIF) ANKLE FRACTURE;  Surgeon: Sherri Rad, MD;  Location: [facility];  Service: Orthopedics;  Laterality: Left;  left medial malleolus fracture   PILONIDAL CYST EXCISION  ~1982   SKIN GRAFT  ~1970   Lt great toe (graft from Lt thigh)   Patient Active Problem List   Diagnosis Date Noted   HTN (hypertension) 07/22/2019   Acute metabolic encephalopathy 07/22/2019   AKI (acute kidney injury) (HCC) 07/22/2019   Sepsis (HCC) 07/22/2019   UTI (urinary tract infection) 07/22/2019   Diabetes mellitus without complication (HCC) 09/10/2012   CHEST PAIN 07/21/2009    ONSET  DATE: 11/17/2023   REFERRING DIAG: G20.A1 (ICD-10-CM) - Parkinson's disease without dyskinesia, unspecified whether manifestations fluctuate (HCC)  Note: OT eval/treat for fine motor deficits from Parkinson Disease.   THERAPY DIAG:  Unsteadiness on feet  Abnormal posture  Muscle weakness (generalized)  Other lack of coordination  Other symptoms and signs involving the nervous system  Visuospatial deficit  Rationale for Evaluation and Treatment: Rehabilitation  SUBJECTIVE:   SUBJECTIVE STATEMENT: I need to improve my walking Pt accompanied by: significant other  PERTINENT HISTORY: PMH: HTN, Diabetes, HLD   PRECAUTIONS: Fall and Other: Cognitive decline  WEIGHT BEARING RESTRICTIONS: No  PAIN:  Are you having pain?  Chronic low back pain  FALLS: Has patient fallen in last 6 months? Yes. Number of falls 1  LIVING ENVIRONMENT: Lives with: lives with their spouse and grown son Lives in: House Stairs: Yes: Internal: 10 steps; on right going up and External: 4 steps; on right going up Has following equipment at home: None  PLOF:  mod assist dressing  PATIENT GOALS: improve handwriting  OBJECTIVE:  Note: Objective measures were completed at Evaluation unless otherwise noted.  HAND DOMINANCE: Right  ADLs: Eating: trouble getting food on utensil, bending down to eat Grooming: mod I  UB Dressing: mod assist LB Dressing: mod to max assist (fluctuating  assistance needed)  Toileting: mod I  Bathing: mod I  Tub Shower transfers: mod I - question safety  Equipment: none  IADLs: Shopping: wife mostly does, however pt does go with wife on occasion Light housekeeping: does laundry but wife folds clothes, pt takes trash out, occasionally vacuums Meal Prep: pt's son does most cooking however pt does breakfast Community mobility: pt does not drive Medication management: pt is doing w/ visual cues Financial management: wife does  Handwriting: Moderate micrographia,  difficulty forming the letters (apraxia?)   MOBILITY STATUS: Independent  POSTURE COMMENTS:  rounded shoulders (slightly)    FUNCTIONAL OUTCOME MEASURES: From screen on 11/17/23 Fastening/unfastening 3 buttons: = 47 sec.  Physical performance test: PPT#2 (simulated eating) = 29 sec PPT#4 (donning/doffing jacket): = 1:18 sec  COORDINATION: From screen on 11/17/23 9 Hole Peg test: Right: 64 sec; Left: 53 sec Box and Blocks:  Right 16 blocks, Left 27 blocks No tremors reported or observed, but pt later reports occasionally on Lt side  UE ROM:   Lt shoulder slightly stiffer, lacks full elbow extension on Lt and decreased supination bilaterally   SENSATION: Denies change    COGNITION: Overall cognitive status:  impaired spatial relations and insight into task completion, memory impairments , difficulty following commands,   OBSERVATIONS: Bradykinesia and Hypokinesia, 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   Reviewed writing tips from last episode of care including: copying sentence or saying letters aloud, as well as large graph paper to help with spacing of letters.     PATIENT EDUCATION: Education details: hip stretch, bag HEP, OT POC Person educated: Patient and Spouse Education method: Explanation, Demonstration, Tactile cues, Verbal cues, and Handouts Education comprehension: verbalized understanding, verbal cues required, tactile cues required, and needs further education  HOME EXERCISE PROGRAM: 11/23/23: hip stretch, bag HEP  GOALS: Goals reviewed with patient? Yes  SHORT TERM GOALS: Target date: 12/24/23  Independent with PD specific HEP   Baseline: Goal status: INITIAL  2.  Pt will verbalize understanding of  adaptive strategies to increase ease with ADLS/IADLS  (including: eating, dressing, writing) Baseline:  Goal status: INITIAL  3.  Pt will copy a simple sentence with no significant decrease in size and maintain 90% legibility  Baseline:  Goal status: INITIAL  4.  Pt will demonstrate increased ease with dressing as evidenced by decreasing PPT#4 (don/ doff jacket) to 60 secs or less  Baseline: 1:18 sec Goal status: INITIAL   LONG TERM GOALS: Target date: 01/21/24  Pt to only require min assist for UE dressing and mod assist for LE dressing Baseline:  Goal status: INITIAL  2.  Pt will verbalize understanding of ways to prevent future PD related  complications and appropriate community resources prn  Baseline:  Goal status: INITIAL  3.  Pt will verbalize understanding of ways to maintain/enhance thinking skills as able and ways to compensate for STM changes Baseline:  Goal status: INITIAL  4.  Pt will demonstrate improved fine motor coordination for ADLs as evidenced by decreasing 9 hole peg test score for Rt hand by 5 secs or more Baseline: 64 sec Goal status: INITIAL  5.  Pt will demonstrate improved fine motor coordination for ADLs as evidenced by decreasing 9 hole peg test score for Lt hand by 3 secs  Baseline: 53 sec Goal status: INITIAL  6.  Improve RUE function as evidenced by increasing Box & Blocks by 4 blocks  Baseline: 29 blocks Goal status: INITIAL  ASSESSMENT:  CLINICAL IMPRESSION: Patient is a 73 y.o. male who was seen today for occupational therapy evaluation for PD. Pt with significant decline in objective measures from screen performed last week compared to last year. However, pt appears to live rather sedentary lifestyle and has not been doing HEP's issued. Pt also limited by cognitive deficits and will need assist from family to carryover HEP's. Patient currently presents below baseline level of functioning demonstrating functional deficits and impairments as  noted below. Pt would benefit from skilled OT services in the outpatient setting to work on impairments as noted below to help pt return to PLOF as able.    PERFORMANCE DEFICITS: in functional skills including ADLs, IADLs, coordination, dexterity, ROM, strength, flexibility, Fine motor control, Gross motor control, mobility, body mechanics, endurance, decreased knowledge of precautions, decreased knowledge of use of DME, and UE functional use, cognitive skills including learn, memory, orientation, perception, problem solving, safety awareness, sequencing, thought, and understand,   IMPAIRMENTS: are limiting patient from ADLs, IADLs, and social participation.   COMORBIDITIES:  may have co-morbidities  that affects occupational performance. Patient will benefit from skilled OT to address above impairments and improve overall function.  MODIFICATION OR ASSISTANCE TO COMPLETE EVALUATION: No modification of tasks or assist necessary to complete an evaluation.  OT OCCUPATIONAL PROFILE AND HISTORY: Problem focused assessment: Including review of records relating to presenting problem.  CLINICAL DECISION MAKING: Moderate - several treatment options, min-mod task modification necessary  REHAB POTENTIAL: Fair cognitive deficits  EVALUATION COMPLEXITY: Low    PLAN:  OT FREQUENCY: 2x/week  OT DURATION: 8 weeks (anticipate d/c after 6 weeks)   PLANNED INTERVENTIONS: 97535 self care/ADL training, 16109 therapeutic exercise, 97530 therapeutic activity, 97112 neuromuscular re-education, 97140 manual therapy, passive range of motion, functional mobility training, visual/perceptual remediation/compensation, patient/family education, and DME and/or AE instructions  RECOMMENDED OTHER SERVICES: none at this time  CONSULTED AND AGREED WITH PLAN OF CARE: Patient and family member/caregiver  PLAN FOR NEXT SESSION: review bag HEP again, practice jacket, ADL strategies   Sheran Lawless, OT 11/23/2023, 3:08  PM

## 2023-11-19 NOTE — Telephone Encounter (Signed)
Appears this was ordered by Dr. Teresa Coombs.

## 2023-11-23 ENCOUNTER — Ambulatory Visit: Payer: Medicare Other | Admitting: Physical Therapy

## 2023-11-23 ENCOUNTER — Encounter: Payer: Self-pay | Admitting: Occupational Therapy

## 2023-11-23 ENCOUNTER — Ambulatory Visit: Payer: Medicare Other | Admitting: Occupational Therapy

## 2023-11-23 ENCOUNTER — Encounter: Payer: Self-pay | Admitting: Physical Therapy

## 2023-11-23 DIAGNOSIS — M6281 Muscle weakness (generalized): Secondary | ICD-10-CM | POA: Diagnosis not present

## 2023-11-23 DIAGNOSIS — R41842 Visuospatial deficit: Secondary | ICD-10-CM

## 2023-11-23 DIAGNOSIS — R2689 Other abnormalities of gait and mobility: Secondary | ICD-10-CM

## 2023-11-23 DIAGNOSIS — Z9181 History of falling: Secondary | ICD-10-CM

## 2023-11-23 DIAGNOSIS — R471 Dysarthria and anarthria: Secondary | ICD-10-CM | POA: Diagnosis not present

## 2023-11-23 DIAGNOSIS — M5459 Other low back pain: Secondary | ICD-10-CM | POA: Diagnosis not present

## 2023-11-23 DIAGNOSIS — R2681 Unsteadiness on feet: Secondary | ICD-10-CM | POA: Diagnosis not present

## 2023-11-23 DIAGNOSIS — R293 Abnormal posture: Secondary | ICD-10-CM

## 2023-11-23 DIAGNOSIS — R41841 Cognitive communication deficit: Secondary | ICD-10-CM | POA: Diagnosis present

## 2023-11-23 DIAGNOSIS — R41840 Attention and concentration deficit: Secondary | ICD-10-CM | POA: Diagnosis present

## 2023-11-23 DIAGNOSIS — R278 Other lack of coordination: Secondary | ICD-10-CM

## 2023-11-23 DIAGNOSIS — R29818 Other symptoms and signs involving the nervous system: Secondary | ICD-10-CM

## 2023-11-23 NOTE — Therapy (Signed)
OUTPATIENT PHYSICAL THERAPY NEURO EVALUATION   Patient Name: Edward Briggs MRN: [id number] DOB:Mar 23, 1951, 73 y.o., male Today's Date: 11/23/2023   PCP: [name], MD REFERRING PROVIDER: Windell Norfolk, MD  END OF SESSION:  PT End of Session - 11/23/23 1317     Visit Number 1    Number of Visits 7   Plus eval   Date for PT Re-Evaluation 01/18/24    Authorization Type [employer] Medicare    PT Start Time 1315    PT Stop Time 1359    PT Time Calculation (min) 44 min    Activity Tolerance Patient tolerated treatment well    Behavior During Therapy WFL for tasks assessed/performed             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   Past Surgical History:  Procedure Laterality Date   ABSCESS DRAINAGE     COLONOSCOPY     ~ 10 yrs ago   FRACTURE SURGERY     HARDWARE REMOVAL  03/26/2012   Procedure: HARDWARE REMOVAL;  Surgeon: Sherri Rad, MD;  Location: [facility];  Service: Orthopedics;  Laterality: Left;  hardware removal deep left ankle syndesmotic screws only and stress xrays ankle    ORIF ANKLE FRACTURE  12/03/2011   Procedure: OPEN REDUCTION INTERNAL FIXATION (ORIF) ANKLE FRACTURE;  Surgeon: Sherri Rad, MD;  Location: [facility];  Service: Orthopedics;  Laterality: Left;  left medial malleolus fracture   PILONIDAL CYST EXCISION  ~1982   SKIN GRAFT  ~1970   Lt great toe (graft from Lt thigh)   Patient Active Problem List   Diagnosis Date Noted   HTN (hypertension) 07/22/2019   Acute metabolic encephalopathy 07/22/2019   AKI (acute kidney injury) (HCC) 07/22/2019   Sepsis (HCC) 07/22/2019   UTI (urinary tract infection) 07/22/2019   Diabetes mellitus without complication (HCC) 09/10/2012   CHEST PAIN 07/21/2009    ONSET DATE: 11/17/2023 (referral)    REFERRING DIAG: G20.A1 (ICD-10-CM) - Parkinson's disease without dyskinesia, unspecified whether manifestations fluctuate (HCC)  THERAPY DIAG:  Muscle weakness (generalized)  Unsteadiness on feet  Abnormal posture  History of falling  Other abnormalities of gait and mobility  Other low back pain  Rationale for Evaluation and Treatment: Rehabilitation  SUBJECTIVE:  SUBJECTIVE STATEMENT: Pt, who is well known to this therapist, presents without AD. States he has been trying to exercise more recently, which includes walking "faster" and cleaning up around the house. Is sedentary. Had one fall in October in the kitchen, turned too quickly and slipped. Had on slick socks. Denies injuries and was able to get up on his own. States he has not been taking Sinemet 4x/day, did not realize this was increased (was increased by Dr. Teresa Coombs in May 2024). Taking Sinemet at 11am, 5pm and before bed. Did have a DaT Scan which ruled in PD.    Pt accompanied by: significant other (wife, [name])   PERTINENT HISTORY: HTN, Diabetes, HLD  PAIN:  Are you having pain? Yes: NPRS scale: 1 Pain location: Low back pain  Pain description: achy/throbbing  Aggravating factors: getting into/out of bed, standing for too long Relieving factors: "it comes and goes"   PRECAUTIONS: Fall  RED FLAGS: None   WEIGHT BEARING RESTRICTIONS: No  FALLS: Has patient fallen in last 6 months? Yes. Number of falls 1  LIVING ENVIRONMENT: Lives with: lives with their spouse Lives in: House/apartment Stairs: Yes: Internal: full flight steps; on left going up and External: 4 steps; on left going up Has following equipment at home: None  PLOF: Needs assistance with ADLs (difficulty w/dressing)  PATIENT GOALS: "Balance"   OBJECTIVE:   Note: Objective measures were completed at Evaluation unless otherwise noted.  DIAGNOSTIC FINDINGS: DaT Scan from 04/09/23 FINDINGS: Decreased radiotracer activity in the posterior RIGHT putamen. Mild decreased activity in the head of the LEFT caudate nucleus compared to the RIGHT. Relatively normal shape of the LEFT striatum   IMPRESSION: Decreased radiotracer activity posterior RIGHT putamen is a pattern which could indicate Parkinsonian syndrome pathology.   Of note, DaTSCAN is not diagnostic of Parkinsonian syndromes, which remains a clinical diagnosis. DaTscan is an adjuvant test to aid in the clinical diagnosis of Parkinsonian syndromes.  COGNITION: Overall cognitive status: Impaired   SENSATION: Pt denies numbness/tingling   COORDINATION: Decreased amplitude of movement w/finger flicks (LUE>RUE). No dysdiadokinesia   Heel to shin test: Decreased ROM bilaterally    POSTURE: rounded shoulders, forward head, and weight shift right  LOWER EXTREMITY ROM:     Active  Right Eval Left Eval  Hip flexion    Hip extension    Hip abduction    Hip adduction    Hip internal rotation    Hip external rotation    Knee flexion    Knee extension    Ankle dorsiflexion    Ankle plantarflexion    Ankle inversion    Ankle eversion     (Blank rows = not tested)  LOWER EXTREMITY MMT:  Tested in seated position   MMT Right Eval Left Eval  Hip flexion 4 4-  Hip extension    Hip abduction 4+ 4+  Hip adduction 4+ 4+  Hip internal rotation    Hip external rotation    Knee flexion 5 4-  Knee extension 5 4-  Ankle dorsiflexion 5 5  Ankle plantarflexion    Ankle inversion    Ankle eversion    (Blank rows = not tested)  BED MOBILITY:  Pt reports more difficulty to get into bed than out. Bed is tall and pt's back pain affects him    TRANSFERS: Assistive device utilized: None  Sit to stand: SBA Stand to sit: SBA Chair to chair: Min A (pt reaching for chair prior to fully  turning to sit  down, resulting in chair tipping and pt landing at very edge of seat)   GAIT: Gait pattern: step to pattern, decreased arm swing- Right, decreased arm swing- Left, decreased stride length, decreased ankle dorsiflexion- Right, decreased ankle dorsiflexion- Left, shuffling, festinating, lateral hip instability, decreased trunk rotation, trunk flexed, narrow BOS, poor foot clearance- Right, and poor foot clearance- Left Distance walked: Various clinic distances   Assistive device utilized: None Level of assistance: CGA Comments: Pt frequently losing balance w/turns due to moving too quickly. Max cues to fully turn to sit in chair as pt tips chair over due to reaching for it prior to turning. Pt w/several anterior LOB episodes due to anterior lean and catching LLE   FUNCTIONAL TESTS:   [facility] PT Assessment - 11/23/23 1339       Transfers   Five time sit to stand comments  39.56s   Intermittent UE support, lack of full hip extension     Ambulation/Gait   Gait velocity 17m over 10s = 1 m/s no AD   CGA for safety     Timed Up and Go Test   Normal TUG (seconds) 14.34   no AD, Min A due to anterior LOB (L foot caught)   Cognitive TUG (seconds) 17.65   Attempted retro counting by 1s starting at 42. Pt only said 42 and stopped counting                                                                                                                                          TREATMENT DATE: N/A eval     PATIENT EDUCATION: Education details: Reviewed sit to stands from previous HEP, POC, eval findings, importance of working on exercises at home  Person educated: Patient and Spouse Education method: Programmer, multimedia, Facilities manager, Verbal cues, and Handouts Education comprehension: verbalized understanding, returned demonstration, verbal cues required, and needs further education  HOME EXERCISE PROGRAM: To be reviewed: Access Code: [email or web address]   GOALS: Goals reviewed with patient?  Yes  SHORT TERM GOALS: Target date: 12/21/2023   Pt will be independent with initial HEP for improved strength, balance, transfers and gait.  Baseline: started to review on eval  Goal status: INITIAL  2.  Pt will improve 5 x STS to less than or equal to 35 seconds w/proper body mechanics to demonstrate improved functional strength and transfer efficiency.   Baseline: 39.56s w/intermittent UE support and flexed posture  Goal status: INITIAL  3.  Pt will improve *** TUG to less than or equal to *** seconds for improved functional mobility and decreased fall risk.  Baseline: 14.34s no AD and min A  Goal status: INITIAL  4.  *** Baseline:  Goal status: INITIAL  5.  *** Baseline:  Goal status: INITIAL  6.  *** Baseline:  Goal status: INITIAL  LONG TERM GOALS: Target date: 01/04/2024   Pt will be independent with final HEP  for improved strength, balance, transfers and gait.  Baseline:  Goal status: INITIAL  2.  Pt will improve 5 x STS to less than or equal to 28 seconds w/proper body mechanics to demonstrate improved functional strength and transfer efficiency.   Baseline: 39.56s w/intermittent UE support and flexed posture  Goal status: INITIAL  3.  Pt will improve normal TUG to less than or equal to 12 seconds w/LRAD and SBA for improved functional mobility and decreased fall risk.  Baseline: 14.34s no AD and min A  Goal status: INITIAL  4.  *** Baseline:  Goal status: INITIAL  5.  *** Baseline:  Goal status: INITIAL  6.  *** Baseline:  Goal status: INITIAL  ASSESSMENT:  CLINICAL IMPRESSION: Patient is a 73 year old male referred to Neuro OPPT for PD.   Pt's PMH is significant for: HTN, Diabetes, HLD. The following deficits were present during the exam: ***. Based on ***, pt is an incr risk for falls. Pt would benefit from skilled PT to address these impairments and functional limitations to maximize functional mobility independence   OBJECTIVE  IMPAIRMENTS: Abnormal gait, decreased activity tolerance, decreased balance, decreased cognition, decreased knowledge of use of DME, decreased mobility, difficulty walking, decreased strength, decreased safety awareness, improper body mechanics, and pain.   ACTIVITY LIMITATIONS: carrying, lifting, transfers, bed mobility, and locomotion level  PARTICIPATION LIMITATIONS: cleaning, laundry, medication management, interpersonal relationship, driving, shopping, community activity, and yard work  PERSONAL FACTORS: Fitness and 1 comorbidity: PD  are also affecting patient's functional outcome.   REHAB POTENTIAL: Fair due to PD prognosis  CLINICAL DECISION MAKING: Evolving/moderate complexity  EVALUATION COMPLEXITY: Moderate  PLAN:  PT FREQUENCY: 1x/week  PT DURATION: 6 weeks  PLANNED INTERVENTIONS: 97164- PT Re-evaluation, 97110-Therapeutic exercises, 97530- Therapeutic activity, [id number]- Neuromuscular re-education, 97535- Self Care, 78295- Manual therapy, [phone]- Gait training, [phone]- Aquatic Therapy, Patient/Family education, Balance training, Stair training, Dry Needling, and DME instructions  PLAN FOR NEXT SESSION: ***    Eryca Bolte E Uno Esau, PT, DPT 11/23/2023, 2:01 PM

## 2023-11-23 NOTE — Patient Instructions (Signed)
Hip Stretch    Put right ankle over left knee. Let right knee fall downward, but keep ankle in place. Feel the stretch in hip. May push down gently with hand to feel stretch. Hold __10__ seconds while counting out loud. Repeat with other leg. Repeat __5__ times. Do __1-2__ sessions per day.    Bag Exercises:  Small trash bag or produce bag works best.  For all exercises, sit with big posture (sit up tall with head up) and use big movements. Perform the following exercises 1-2 times per day.  Hold bag in one hand. Stretch both arms/hands out to the side as big as you can. Then, pass bag from one hand to the other BEHIND you. Stretch arms back out big after each pass. Repeat 10 times.  Hold bag in both hands in front of you with hands/arms shoulder length apart. Move bag behind your head. Repeat 10 times.  Hold bag in both hands in front of you with hands/arms shoulder length apart. Lift leg and move bag completely under each foot and back. Repeat 10 times on each side.

## 2023-11-26 ENCOUNTER — Other Ambulatory Visit: Payer: Self-pay | Admitting: Family Medicine

## 2023-11-26 DIAGNOSIS — Z794 Long term (current) use of insulin: Secondary | ICD-10-CM

## 2023-11-26 NOTE — Telephone Encounter (Signed)
Refilled with .

## 2023-11-26 NOTE — Telephone Encounter (Signed)
Requested Prescriptions   Pending Prescriptions Disp Refills   Metformin HCl 500 MG/5ML SOLN [Pharmacy Med Name: metFORMIN HCl 500 MG/5ML Oral Solution] 300 mL 0    Sig: TAKE 10 ML BY MOUTH  TWICE DAILY     Date of patient request: 09/25/2024  Last office visit: 09/18/2023 Upcoming visit: 12/17/2023 Date of last refill: 11/09/2023 Last refill amount:    Please advise, was this only supposed to be for 15 days?

## 2023-11-30 ENCOUNTER — Ambulatory Visit: Payer: Medicare Other

## 2023-11-30 ENCOUNTER — Ambulatory Visit: Payer: Medicare Other | Admitting: Occupational Therapy

## 2023-11-30 ENCOUNTER — Encounter: Payer: Self-pay | Admitting: Physical Therapy

## 2023-11-30 ENCOUNTER — Ambulatory Visit: Payer: Medicare Other | Admitting: Physical Therapy

## 2023-11-30 DIAGNOSIS — R293 Abnormal posture: Secondary | ICD-10-CM

## 2023-11-30 DIAGNOSIS — R278 Other lack of coordination: Secondary | ICD-10-CM

## 2023-11-30 DIAGNOSIS — R41841 Cognitive communication deficit: Secondary | ICD-10-CM

## 2023-11-30 DIAGNOSIS — R2681 Unsteadiness on feet: Secondary | ICD-10-CM | POA: Diagnosis not present

## 2023-11-30 DIAGNOSIS — R471 Dysarthria and anarthria: Secondary | ICD-10-CM | POA: Diagnosis not present

## 2023-11-30 DIAGNOSIS — R41840 Attention and concentration deficit: Secondary | ICD-10-CM

## 2023-11-30 DIAGNOSIS — R29818 Other symptoms and signs involving the nervous system: Secondary | ICD-10-CM | POA: Diagnosis not present

## 2023-11-30 DIAGNOSIS — M6281 Muscle weakness (generalized): Secondary | ICD-10-CM | POA: Diagnosis not present

## 2023-11-30 DIAGNOSIS — M5459 Other low back pain: Secondary | ICD-10-CM | POA: Diagnosis not present

## 2023-11-30 DIAGNOSIS — Z9181 History of falling: Secondary | ICD-10-CM | POA: Diagnosis not present

## 2023-11-30 DIAGNOSIS — R2689 Other abnormalities of gait and mobility: Secondary | ICD-10-CM | POA: Diagnosis not present

## 2023-11-30 NOTE — Therapy (Signed)
OUTPATIENT OCCUPATIONAL THERAPY PARKINSON'S TREATMENT  Patient Name: Edward Briggs MRN: [id number] DOB:Sep 28, 1951, 73 y.o., male Today's Date: 11/30/2023  PCP: [name], MD REFERRING PROVIDER: Windell Norfolk, MD   END OF SESSION:  OT End of Session - 11/30/23 1402     Visit Number 2    Number of Visits 16    Date for OT Re-Evaluation 01/21/24    Authorization Type [employer] Medicare    Progress Note Due on Visit 10    OT Start Time 1400    OT Stop Time 1445    OT Time Calculation (min) 45 min    Activity Tolerance Patient tolerated treatment well    Behavior During Therapy WFL for tasks assessed/performed             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   Past Surgical History:  Procedure Laterality Date   ABSCESS DRAINAGE     COLONOSCOPY     ~ 10 yrs ago   FRACTURE SURGERY     HARDWARE REMOVAL  03/26/2012   Procedure: HARDWARE REMOVAL;  Surgeon: Sherri Rad, MD;  Location: [facility];  Service: Orthopedics;  Laterality: Left;  hardware removal deep left ankle syndesmotic screws only and stress xrays ankle    ORIF ANKLE FRACTURE  12/03/2011   Procedure: OPEN REDUCTION INTERNAL FIXATION (ORIF) ANKLE FRACTURE;  Surgeon: Sherri Rad, MD;  Location: [facility];  Service: Orthopedics;  Laterality: Left;  left medial malleolus fracture   PILONIDAL CYST EXCISION  ~1982   SKIN GRAFT  ~1970   Lt great toe (graft from Lt thigh)   Patient Active Problem List   Diagnosis Date Noted   HTN (hypertension) 07/22/2019   Acute metabolic encephalopathy 07/22/2019   AKI (acute kidney injury) (HCC) 07/22/2019   Sepsis (HCC) 07/22/2019   UTI (urinary tract infection) 07/22/2019   Diabetes mellitus without complication (HCC) 09/10/2012   CHEST PAIN 07/21/2009    ONSET  DATE: 11/17/2023   REFERRING DIAG: G20.A1 (ICD-10-CM) - Parkinson's disease without dyskinesia, unspecified whether manifestations fluctuate (HCC)  Note: OT eval/treat for fine motor deficits from Parkinson Disease.   THERAPY DIAG:  Other symptoms and signs involving the nervous system  Abnormal posture  Other lack of coordination  Attention and concentration deficit  Unsteadiness on feet  Rationale for Evaluation and Treatment: Rehabilitation  SUBJECTIVE:   SUBJECTIVE STATEMENT: Same low back pain (chronic). No new falls Pt accompanied by: significant other  PERTINENT HISTORY: PMH: HTN, Diabetes, HLD   PRECAUTIONS: Fall and Other: Cognitive decline  WEIGHT BEARING RESTRICTIONS: No  PAIN:  Are you having pain?  Chronic low back pain  FALLS: Has patient fallen in last 6 months? Yes. Number of falls 1  LIVING ENVIRONMENT: Lives with: lives with their spouse and grown son Lives in: House Stairs: Yes: Internal: 10 steps; on right going up and External: 4 steps; on right going up Has following equipment at home: None  PLOF:  mod assist dressing  PATIENT GOALS: improve handwriting  OBJECTIVE:  Note: Objective measures were completed at Evaluation unless otherwise noted.  HAND DOMINANCE: Right  ADLs: Eating: trouble getting food on utensil, bending down to eat Grooming: mod I  UB Dressing: mod assist LB Dressing: mod to max assist (fluctuating  assistance needed)  Toileting: mod I  Bathing: mod I  Tub Shower transfers: mod I - question safety  Equipment: none  IADLs: Shopping: wife mostly does, however pt does go with wife on occasion Light housekeeping: does laundry but wife folds clothes, pt takes trash out, occasionally vacuums Meal Prep: pt's son does most cooking however pt does breakfast Community mobility: pt does not drive Medication management: pt is doing w/ visual cues Financial management: wife does  Handwriting: Moderate micrographia,  difficulty forming the letters (apraxia?)   MOBILITY STATUS: Independent  POSTURE COMMENTS:  rounded shoulders (slightly)    FUNCTIONAL OUTCOME MEASURES: From screen on 11/17/23 Fastening/unfastening 3 buttons: = 47 sec.  Physical performance test: PPT#2 (simulated eating) = 29 sec PPT#4 (donning/doffing jacket): = 1:18 sec  COORDINATION: From screen on 11/17/23 9 Hole Peg test: Right: 64 sec; Left: 53 sec Box and Blocks:  Right 16 blocks, Left 27 blocks No tremors reported or observed, but pt later reports occasionally on Lt side  UE ROM:   Lt shoulder slightly stiffer, lacks full elbow extension on Lt and decreased supination bilaterally   SENSATION: Denies change    COGNITION: Overall cognitive status:  impaired spatial relations and insight into task completion, memory impairments , difficulty following commands,   OBSERVATIONS: Bradykinesia and Hypokinesia, 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 Recommended doing in the am prior to dressing.   Practiced donning/doffing jacket - pt used cape method to don but donned opposite arm first. Doffing w/ cues to doff off each shoulder one at a time, then grab and yank sleeve off one arm completely before taking off other sleeve  Reviewed handwriting strategies - copying name and address, saying letters aloud, and using graph paper prn for spacing  Practiced getting up from a chair at table and then back to chair at table w/ cues for foot placement, big side steps, and pulling chair to table when sitting  Pt issued big ADL strategies and reviewed - specifically discussed strategies for eating (holding utensil at middle, bowl or lipped plate, and scooping towards mouth), cutting food, jacket, writing, getting hand big  around items before picking up, twisting big for seat belt, and big turns for twist lids.  Also recommended sitting to get feet in pants for safety/fall prevention and so he can focus on big movements while donning pants over feet up to thighs w/o risk for falls.   PATIENT EDUCATION: Education details:ADL strategies Person educated: Patient and Spouse Education method: Explanation, Demonstration, Tactile cues, Verbal cues, and Handouts Education comprehension: verbalized understanding, verbal cues required, tactile cues required, and needs further education  HOME EXERCISE PROGRAM: 11/23/23: hip stretch, bag HEP  GOALS: Goals reviewed with patient? Yes  SHORT TERM  GOALS: Target date: 12/24/23  Independent with PD specific HEP   Baseline: Goal status: INITIAL  2.  Pt will verbalize understanding of adaptive strategies to increase ease with ADLS/IADLS  (including: eating, dressing, writing) Baseline:  Goal status: IN PROGRESS  3.  Pt will copy a simple sentence with no significant decrease in size and maintain 90% legibility  Baseline:  Goal status: IN PROGRESS  4.  Pt will demonstrate increased ease with dressing as evidenced by decreasing PPT#4 (don/ doff jacket) to 60 secs or less  Baseline: 1:18 sec Goal status: INITIAL   LONG TERM GOALS: Target date: 01/21/24  Pt to only require min assist for UE dressing and mod assist for LE dressing Baseline:  Goal status: INITIAL  2.  Pt will verbalize understanding of ways to prevent future PD related complications and appropriate community resources prn  Baseline:  Goal status: INITIAL  3.  Pt will verbalize understanding of ways to maintain/enhance thinking skills as able and ways to compensate for STM changes Baseline:  Goal status: INITIAL  4.  Pt will demonstrate improved fine motor coordination for ADLs as evidenced by decreasing 9 hole peg test score for Rt hand by 5 secs or more Baseline: 64 sec Goal status:  INITIAL  5.  Pt will demonstrate improved fine motor coordination for ADLs as evidenced by decreasing 9 hole peg test score for Lt hand by 3 secs  Baseline: 53 sec Goal status: INITIAL  6.  Improve RUE function as evidenced by increasing Box & Blocks by 4 blocks  Baseline: 29 blocks Goal status: INITIAL  ASSESSMENT:  CLINICAL IMPRESSION: Patient seen today for occupational therapy treatment for PD. Pt with significant decline in objective measures compared to last year. Pt also with cognitive decline. However, pt appears to live rather sedentary lifestyle and has not been doing HEP's issued. Pt also limited by cognitive deficits and will need assist from family to carryover HEP's. Patient currently presents below baseline level of functioning demonstrating functional deficits and impairments as noted below. Pt would benefit from skilled OT services in the outpatient setting to work on impairments as noted below to help pt return to PLOF as able.    PERFORMANCE DEFICITS: in functional skills including ADLs, IADLs, coordination, dexterity, ROM, strength, flexibility, Fine motor control, Gross motor control, mobility, body mechanics, endurance, decreased knowledge of precautions, decreased knowledge of use of DME, and UE functional use, cognitive skills including learn, memory, orientation, perception, problem solving, safety awareness, sequencing, thought, and understand,   IMPAIRMENTS: are limiting patient from ADLs, IADLs, and social participation.   COMORBIDITIES:  may have co-morbidities  that affects occupational performance. Patient will benefit from skilled OT to address above impairments and improve overall function.  MODIFICATION OR ASSISTANCE TO COMPLETE EVALUATION: No modification of tasks or assist necessary to complete an evaluation.  OT OCCUPATIONAL PROFILE AND HISTORY: Problem focused assessment: Including review of records relating to presenting problem.  CLINICAL DECISION  MAKING: Moderate - several treatment options, min-mod task modification necessary  REHAB POTENTIAL: Fair cognitive deficits  EVALUATION COMPLEXITY: Low    PLAN:  OT FREQUENCY: 2x/week  OT DURATION: 8 weeks (anticipate d/c after 6 weeks)   PLANNED INTERVENTIONS: 97535 self care/ADL training, 16109 therapeutic exercise, 97530 therapeutic activity, 97112 neuromuscular re-education, 97140 manual therapy, passive range of motion, functional mobility training, visual/perceptual remediation/compensation, patient/family education, and DME and/or AE instructions  RECOMMENDED OTHER SERVICES: none at this time  CONSULTED AND AGREED WITH PLAN OF CARE: Patient and family  member/caregiver  PLAN FOR NEXT SESSION: continue to reinforce ADL strategies, safety, work on [employer]! Up seated and from sit to stand for posture and more efficiency/safety with transfers.    Sheran Lawless, OT 11/30/2023, 2:02 PM

## 2023-11-30 NOTE — Therapy (Signed)
OUTPATIENT PHYSICAL THERAPY NEURO TREATMENT   Patient Name: Edward Briggs MRN: [id number] DOB:21-Feb-1951, 73 y.o., male Today's Date: 12/01/2023   PCP: [name], MD REFERRING PROVIDER: Windell Norfolk, MD  END OF SESSION:  PT End of Session - 11/30/23 1319     Visit Number 2    Number of Visits 7   Plus eval   Date for PT Re-Evaluation 01/18/24    Authorization Type [employer] Medicare    PT Start Time 1318   handoff from ST   PT Stop Time 1358    PT Time Calculation (min) 40 min    Equipment Utilized During Treatment Gait belt    Activity Tolerance Patient tolerated treatment well    Behavior During Therapy WFL for tasks assessed/performed             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   Past Surgical History:  Procedure Laterality Date   ABSCESS DRAINAGE     COLONOSCOPY     ~ 10 yrs ago   FRACTURE SURGERY     HARDWARE REMOVAL  03/26/2012   Procedure: HARDWARE REMOVAL;  Surgeon: Sherri Rad, MD;  Location: [facility];  Service: Orthopedics;  Laterality: Left;  hardware removal deep left ankle syndesmotic screws only and stress xrays ankle    ORIF ANKLE FRACTURE  12/03/2011   Procedure: OPEN REDUCTION INTERNAL FIXATION (ORIF) ANKLE FRACTURE;  Surgeon: Sherri Rad, MD;  Location: [facility];  Service: Orthopedics;  Laterality: Left;  left medial malleolus fracture   PILONIDAL CYST EXCISION  ~1982   SKIN GRAFT  ~1970   Lt great toe (graft from Lt thigh)   Patient Active Problem List   Diagnosis Date Noted   HTN (hypertension) 07/22/2019   Acute metabolic encephalopathy 07/22/2019   AKI (acute kidney injury) (HCC) 07/22/2019   Sepsis (HCC) 07/22/2019   UTI (urinary tract infection) 07/22/2019   Diabetes mellitus without complication (HCC) 09/10/2012    CHEST PAIN 07/21/2009    ONSET DATE: 11/17/2023 (referral)   REFERRING DIAG: G20.A1 (ICD-10-CM) - Parkinson's disease without dyskinesia, unspecified whether manifestations fluctuate (HCC)  THERAPY DIAG:  Unsteadiness on feet  Abnormal posture  Muscle weakness (generalized)  Other lack of coordination  Rationale for Evaluation and Treatment: Rehabilitation  SUBJECTIVE:  SUBJECTIVE STATEMENT: Had his speech therapy eval today, wants him to talk louder. No falls. Reports taking Carbidopa Levodopa 3 times a day instead of 4 times a day (what was changed from Dr. Teresa Coombs in May 2024 visit).   Pt accompanied by: significant other (wife, [name])   PERTINENT HISTORY: HTN, Diabetes, HLD  PAIN:  Are you having pain? Yes: NPRS scale: 6-7/10 Pain location: Low back pain  Pain description: achy/throbbing  Aggravating factors: getting into/out of bed, standing for too long Relieving factors: "it comes and goes"   PRECAUTIONS: Fall  RED FLAGS: None   WEIGHT BEARING RESTRICTIONS: No  FALLS: Has patient fallen in last 6 months? Yes. Number of falls 1  LIVING ENVIRONMENT: Lives with: lives with their spouse Lives in: House/apartment Stairs: Yes: Internal: full flight steps; on left going up and External: 4 steps; on left going up Has following equipment at home: None  PLOF: Needs assistance with ADLs (difficulty w/dressing)  PATIENT GOALS: "Balance"   OBJECTIVE:  Note: Objective measures were completed at Evaluation unless otherwise noted.  DIAGNOSTIC FINDINGS: DaT Scan from 04/09/23 FINDINGS: Decreased radiotracer activity in the posterior RIGHT putamen. Mild decreased activity in the head of the LEFT caudate nucleus compared to the RIGHT. Relatively normal shape of the LEFT striatum    IMPRESSION: Decreased radiotracer activity posterior RIGHT putamen is a pattern which could indicate Parkinsonian syndrome pathology.   Of note, DaTSCAN is not diagnostic of Parkinsonian syndromes, which remains a clinical diagnosis. DaTscan is an adjuvant test to aid in the clinical diagnosis of Parkinsonian syndromes.  COGNITION: Overall cognitive status: Impaired   SENSATION: Pt denies numbness/tingling   COORDINATION: Decreased amplitude of movement w/finger flicks (LUE>RUE). No dysdiadokinesia   Heel to shin test: Decreased ROM bilaterally    POSTURE: rounded shoulders, forward head, and weight shift right  LOWER EXTREMITY ROM:     Active  Right Eval Left Eval  Hip flexion    Hip extension    Hip abduction    Hip adduction    Hip internal rotation    Hip external rotation    Knee flexion    Knee extension    Ankle dorsiflexion    Ankle plantarflexion    Ankle inversion    Ankle eversion     (Blank rows = not tested)  LOWER EXTREMITY MMT:  Tested in seated position   MMT Right Eval Left Eval  Hip flexion 4 4-  Hip extension    Hip abduction 4+ 4+  Hip adduction 4+ 4+  Hip internal rotation    Hip external rotation    Knee flexion 5 4-  Knee extension 5 4-  Ankle dorsiflexion 5 5  Ankle plantarflexion    Ankle inversion    Ankle eversion    (Blank rows = not tested)  BED MOBILITY:  Pt reports more difficulty to get into bed than out. Bed is tall and pt's back pain affects him    TRANSFERS: Assistive device utilized: None  Sit to stand: SBA Stand to sit: SBA Chair to chair: Min A (pt reaching for chair prior to fully turning to sit down, resulting in chair tipping and pt landing at very edge of seat)  TREATMENT DATE:   Therapeutic Exercise: SciFit multi-peaks level 4.0 for 8 minutes using BUE/BLEs for neural priming  for reciprocal movement, dynamic cardiovascular warmup and increased amplitude of stepping. Pt reporting RPE of 10/10 with level 4.0, so decreased to level 3.0, with pt then reporting RPE as 8/10. Pt closing his eyes intermittently during SciFit, pt reporting he feels ok but is just relaxing. PT needing to remind pt to open up eyes during SciFit  Assessed BP after at: 107/68, HR: 84 bpm   GAIT: Gait pattern: step to pattern, decreased arm swing- Right, decreased arm swing- Left, decreased stride length, decreased ankle dorsiflexion- Right, decreased ankle dorsiflexion- Left, shuffling, festinating, lateral hip instability, decreased trunk rotation, trunk flexed, narrow BOS, poor foot clearance- Right, and poor foot clearance- Left Distance walked: 230' x 1 with RW, small clinic distances with no AD   Assistive device utilized: None, 2 wheeled RW  Level of assistance: CGA Comments: With small distances with no AD during session, cues for posture and step length   Trialed RW for improved safety/stability with gait, pt needing max cues throughout 230' of gait for staying inside RW and for incr stride length/foot clearance. Pt ambulating slower around turns and shorter steps (although safer this way instead of performing with no AD). Pt does not respond to cues with gait with RW. When going to turn to sit back down to mat table, pt with significant difficulty sequencing RW to turn and sit to mat table, needing max verbal/demo cues from therapist and taking incr time to perform.   Pt frequently losing balance w/turns due to moving too quickly and freezing. Max cues to fully turn to sit in chair as pt tips chair over due to reaching for it prior to turning. Pt w/several anterior LOB episodes due to anterior lean and catching LLE  Therapeutic Activity:  Reviewed proper dosage of Carbidopa Levodopa (Dr. Teresa Coombs per May 2024 note wanted pt to take it 4 times a day instead of 3 times a day), discussed reaching  out to Dr. Teresa Coombs  Discussed will continue to practice with use of RW to provide stability during ambulation to decr fall risk. Do not recommend one for home yet. Discussed ways of obtaining one - either through insurance or can purchase off [employer]. Discussed rollator would not be a safe option due to it being more unstable and use of brakes. Reviewed sit <> stands - with focus on scooting out towards the edge, widening BOS, and tucking feet under. When cued to tuck feet under, pt just moved feet farther out away from him and to more narrow a BOS. Pt needing max demo and verbal cues and took incr time to get proper set up. Cued to also perform with incr forward lean with nose over toes, once in standing, cued for hip extension and tall posture looking ahead. Performed during session and only 1 rep at end of session as review of HEP   PATIENT EDUCATION: Education details: See therapeutic activity section above.  Person educated: Patient and Spouse Education method: Explanation, Demonstration, and Verbal cues Education comprehension: verbalized understanding, returned demonstration, verbal cues required, and needs further education  HOME EXERCISE PROGRAM: To be reviewed: Access Code: [email or web address]   GOALS: Goals reviewed with patient? Yes  SHORT TERM GOALS: Target date: 12/21/2023   Pt will be independent with initial HEP for improved strength, balance, transfers and gait.  Baseline: started to review on eval  Goal status: INITIAL  2.  Pt will  improve 5 x STS to less than or equal to 35 seconds w/proper body mechanics to demonstrate improved functional strength and transfer efficiency.   Baseline: 39.56s w/intermittent UE support and flexed posture  Goal status: INITIAL  3.  Pt will improve normal TUG to less than or equal to 14 seconds w/LRAD and CGA for improved functional mobility and decreased fall risk.  Baseline: 14.34s no AD and min A  Goal status: INITIAL  4.  Pt and wife will  verbalize and demonstrate fall prevention techniques and freezing strategies to implement at home and in community for reduced fall frequency.  Baseline:  Goal status: INITIAL   LONG TERM GOALS: Target date: 01/04/2024   Pt will be independent with final HEP for improved strength, balance, transfers and gait.  Baseline:  Goal status: INITIAL  2.  Pt will improve 5 x STS to less than or equal to 28 seconds w/proper body mechanics to demonstrate improved functional strength and transfer efficiency.   Baseline: 39.56s w/intermittent UE support and flexed posture  Goal status: INITIAL  3.  Pt will improve normal TUG to less than or equal to 12 seconds w/LRAD and SBA for improved functional mobility and decreased fall risk.  Baseline: 14.34s no AD and min A  Goal status: INITIAL  4.  Pt will verbalize understanding of local PD community resources, including fitness post DC.   Baseline:  Goal status: INITIAL   ASSESSMENT:  CLINICAL IMPRESSION: Today's skilled session focused on starting on SciFit for neural priming and larger amplitude movements. Pt frequently closing his eyes on SciFit with PT needing to remind pt to open his eyes. BP assessed afterwards and was WNL. Trialed a 2 wheeled RW today for improved stability during gait and to decr fall risk. Pt needing max cues throughout gait to stay closer to RW and for incr stride length/foot clearance, but pt did not respond well to cues. Pt overall did look safer with RW than ambulating with no AD. Due to cognition, pt with significant difficulty when going to turn with RW to sit back to the mat table, needing max cues from therapist for sequencing and how to turn RW. Will continue to practice with RW during session. Will continue per POC.     OBJECTIVE IMPAIRMENTS: Abnormal gait, decreased activity tolerance, decreased balance, decreased cognition, decreased knowledge of use of DME, decreased mobility, difficulty walking, decreased  strength, decreased safety awareness, improper body mechanics, and pain.   ACTIVITY LIMITATIONS: carrying, lifting, transfers, bed mobility, and locomotion level  PARTICIPATION LIMITATIONS: cleaning, laundry, medication management, interpersonal relationship, driving, shopping, community activity, and yard work  PERSONAL FACTORS: Fitness and 1 comorbidity: PD  are also affecting patient's functional outcome.   REHAB POTENTIAL: Fair due to PD prognosis  CLINICAL DECISION MAKING: Evolving/moderate complexity  EVALUATION COMPLEXITY: Moderate  PLAN:  PT FREQUENCY: 1x/week  PT DURATION: 6 weeks  PLANNED INTERVENTIONS: 97164- PT Re-evaluation, 97110-Therapeutic exercises, 97530- Therapeutic activity, [id number]- Neuromuscular re-education, 97535- Self Care, 16109- Manual therapy, [id number]- Gait training, [phone]- Aquatic Therapy, Patient/Family education, Balance training, Stair training, Dry Needling, and DME instructions  PLAN FOR NEXT SESSION: Review HEP and update as appropriate. Educate on freezing strategies and fall prevention. Increased step length, BOS and postural control   Keep practicing with RW     Drake Leach, PT, DPT 12/01/2023, 9:06 AM

## 2023-11-30 NOTE — Patient Instructions (Addendum)
Performing Daily Activities with Big Movements  Pick at least 2 activities a day and perform with BIG, DELIBERATE movements/effort.  Try different activities each day. This can make the activity easier and turn daily activities into exercise to prevent problems in the future!  If you are standing during the activity, make sure to keep feet apart and stand with good/big/posture.  Examples: Dressing  Pull-over shirt:  good posture, bring shirt to head (don't bring head down), deliberately push arms into sleeves Jacket:  stand with feet apart, twist when putting on jacket, deliberate push arms into sleeves Underwear/Pants:  Sit, lean forward, and push foot into pants deliberately Open hands to pull down shirt/put on socks/pull up pants--get more material in your hand  Buttoning - Open hands big before fastening each button, deliberate movement (angry buttons--push button through hole), unfasten by using pull-push method   Bathing - Wash/dry with long strokes  Brushing your teeth - Big, slow movements  Cutting food - Long deliberate cuts, put tip of knife down in front of food  Eating - Hold utensil in the middle (not the end), hold fork straight up and down to stab food  Picking up a cup/bottle - Open hand up big and get object all the way in palm  Opening jar/bottle - Move as much as you can with each turn, twist wrist  Putting on seatbelt - Feet apart, twist when reaching, look at where you are reaching  Hanging up clothes/getting clothes down from closet - Reach with big effort, open hand, straighten elbow  Putting away groceries/dishes - Reach with big effort  Wiping counter/table - Move in big, long strokes, open hand  Stirring while cooking - Exaggerate movement  Sweeping/Raking- Feet apart and one foot in front of the other, move arms in big, long strokes  Folding clothes - Exaggerate arm movements  Changing light bulb or Using a screwdriver - Move as much as you can with  each turn, twist wrist  Walking into a store/restaurant - Walk with big steps, good posture, swing arms if able  Standing up from a chair/recliner/sofa - Scoot forward, lean forward, and stand with big effort  Picking up something from floor/reaching in low cabinet - Get close to object, Position feet apart and one in front of the other.

## 2023-11-30 NOTE — Patient Instructions (Addendum)
SPEAK OUT! is a structured program targeting voice in patient's with Parkinson's. This program was developed by [employer]. It is evidence based and based on principles of motor learning. The nonprofit provides free materials to patients being treated by a Speak Out! certified SLP.   www.parkinsonvoiceproject.org for more information  Scroll down the main page and select "Watch 'What is Parkinson's? video'"  Bring reading glasses next week! We will be doing some reading aloud next session.

## 2023-11-30 NOTE — Therapy (Signed)
OUTPATIENT SPEECH LANGUAGE PATHOLOGY PARKINSON'S EVALUATION   Patient Name: Edward Briggs MRN: [id number] DOB:05/09/1951, 73 y.o., male Today's Date: 11/30/2023  PCP: [name], MD REFERRING PROVIDER: [name], MD  END OF SESSION:  End of Session - 11/30/23 1526     Visit Number 1    Number of Visits 17    Date for SLP Re-Evaluation 01/25/24    Authorization Type UHC Medicare    SLP Start Time 1234    SLP Stop Time  1315    SLP Time Calculation (min) 41 min    Activity Tolerance Patient tolerated treatment well             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   Past Surgical History:  Procedure Laterality Date   ABSCESS DRAINAGE     COLONOSCOPY     ~ 10 yrs ago   FRACTURE SURGERY     HARDWARE REMOVAL  03/26/2012   Procedure: HARDWARE REMOVAL;  Surgeon: Sherri Rad, MD;  Location: [facility];  Service: Orthopedics;  Laterality: Left;  hardware removal deep left ankle syndesmotic screws only and stress xrays ankle    ORIF ANKLE FRACTURE  12/03/2011   Procedure: OPEN REDUCTION INTERNAL FIXATION (ORIF) ANKLE FRACTURE;  Surgeon: Sherri Rad, MD;  Location: [facility];  Service: Orthopedics;  Laterality: Left;  left medial malleolus fracture   PILONIDAL CYST EXCISION  ~1982   SKIN GRAFT  ~1970   Lt great toe (graft from Lt thigh)   Patient Active Problem List   Diagnosis Date Noted   HTN (hypertension) 07/22/2019   Acute metabolic encephalopathy 07/22/2019   AKI (acute kidney injury) (HCC) 07/22/2019   Sepsis (HCC) 07/22/2019   UTI (urinary tract infection) 07/22/2019   Diabetes mellitus without complication (HCC) 09/10/2012   CHEST PAIN 07/21/2009    ONSET DATE: 11/17/2023  REFERRING DIAG: Parkinson's  THERAPY DIAG: Dysarthria and  anarthria (R47.1) and Cognitive communication deficit (R41.841)   Rationale for Evaluation and Treatment: Rehabilitation  SUBJECTIVE:   SUBJECTIVE STATEMENT: Pt presents with low conversational volume and imprecise articulation resulting in frequent requested repetition and clarification from SLP Pt accompanied by: significant other, [name]   PERTINENT HISTORY: PMH: HTN, Diabetes, HLD   PAIN: Are you having pain? Yes, 6-7/10 Location: lower back Description: aching  FALLS: Has patient fallen in last 6 months? No  LIVING ENVIRONMENT: Lives with: lives with their family Lives in: House/apartment  PLOF:  Level of assistance: Needed assistance with ADLs, Needed assistance with IADLS Employment: Retired  PATIENT GOALS: increase communication effectiveness   OBJECTIVE:  Note: Objective measures were completed at Evaluation unless otherwise noted.  COGNITION: Overall cognitive status: Impaired per SLP observations; pt and wife denied cognitive challenges Areas of impairment: Memory and Problem solving  MOTOR SPEECH: Overall motor speech: impaired Level of impairment: Conversation Respiration: diaphragmatic/abdominal breathing Phonation: low vocal intensity Resonance: WFL Articulation: Impaired: conversation Intelligibility: Intelligibility reduced Motor planning: Appears intact Motor speech errors: aware and inconsistent Interfering components:  PD Effective technique: slow rate, increased vocal intensity, over articulate, pause, and pacing  ORAL MOTOR EXAMINATION: Overall status: Impaired:   Labial: Bilateral (ROM and Coordination) Lingual: Bilateral (ROM and Coordination) Facial: Bilateral (ROM and Coordination) Comments: Flat affect, imprecise oral motor movements  OBJECTIVE VOICE ASSESSMENT: Sustained "ah" maximum phonation time: 8 seconds Sustained "ah" loudness average: 64 dB Oral reading (passage) loudness average: 55 dB Oral reading loudness range: 50-60  dB Conversational loudness average: 52 dB Conversational loudness range:  48-58 dB Voice quality: low vocal intensity, rushes in speech  Stimulability trials: Given SLP modeling and consistent mod/max cues, loudness average increased to 60 dB (range of 55 dB to 65 dB) during oral reading, 75 dB (range of 72-78 dB) during loud "ah" (increased phonation time of 16 seconds), and 55 dB (range of 50-60 dB) at conversation level.     Completed audio recording of patients baseline voice without cueing from SLP: Yes  Pt does not report difficulty with swallowing which does not warrant further evaluation.                                                                                                                            TREATMENT DATE:  11/30/23: ST assessed pt's voice and motor speech. Pt presents with low/weakened voice ranging from 48-50 dB in conversation w/ usual cues for increased volume, and impaired intelligibility with imprecise articulation in conversational speech. ST introduced Speak Out! and provided pt education on the importance of speaking with intent, and resources to provide further knowledge on Parkinson's Disease, the Speak Out! program, and how it will be implemented in future sessions.   PATIENT EDUCATION: Education details: See above Person educated: Patient Education method: Explanation, Demonstration, and Handouts Education comprehension: verbalized understanding and needs further education  HOME EXERCISE PROGRAM: Speak Out! Lessons   GOALS: Goals reviewed with patient? Yes  SHORT TERM GOALS: Target date: 12/28/2023  Pt will complete daily HEP as recommended for 2/2 sessions. Baseline: Goal status: INITIAL  2.  Pt will achieve targeted dB levels on SPEAK OUT! exercises with 80% accuracy given mod-A for 2/2 sessions.  Baseline:  Goal status: INITIAL  3.  Pt will utilize dysarthria compensations during a 5 min conversation x2 to optimize vocal intensity and  clarity given mod-A. Baseline:  Goal status: INITIAL   LONG TERM GOALS: Target date: 01/25/2024  Pt will maintain WNL volume during a 10-15 minute conversation in 2/2 sessions with min-A. Baseline:  Goal status: INITIAL  2.  Pt will subjectively report less frequent cues from family/friends to increase volume in conversation across one week. Baseline:  Goal status: INITIAL  3.   Pt will report improved communication effectiveness via PROM by 2 point improvement by last ST session.  Baseline:  Goal status: INITIAL   ASSESSMENT:  CLINICAL IMPRESSION: Patient is a 73 y.o. 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 Conversational volume averaged mid 50s dB, which greatly impacted listener comprehension and resulted in frequent repetition and clarification required to understand pt. Was stimulable for increased volume and clarity with Speak Out! Principles given max A. Introduced [employer] Program today. Denied dysphagia or cognitive changes, although some short term recall  challenges noted. Pt would benefit from skilled ST intervention to optimize communication effectiveness.   OBJECTIVE IMPAIRMENTS: Objective impairments include dysarthria and voice disorder. These impairments are limiting patient from effectively communicating at home and in community. Factors affecting potential to achieve goals and functional outcome are co-morbidities. Patient will benefit from skilled SLP services to address above impairments and improve overall function.  REHAB POTENTIAL: Good  PLAN:  SLP FREQUENCY: 2x/week  SLP DURATION: 8 weeks  PLANNED INTERVENTIONS: Language facilitation, Environmental controls, Cueing hierachy, Internal/external aids, Oral motor exercises, Functional tasks, SLP instruction and feedback, Compensatory strategies, Patient/family education, and [id number] Treatment of speech (30 or 45 min)     Gracy Racer,  CCC-SLP 11/30/2023, 3:29 PM

## 2023-12-02 ENCOUNTER — Ambulatory Visit: Payer: Medicare Other | Admitting: Occupational Therapy

## 2023-12-02 ENCOUNTER — Ambulatory Visit: Payer: Medicare Other | Admitting: Physical Therapy

## 2023-12-02 DIAGNOSIS — R29818 Other symptoms and signs involving the nervous system: Secondary | ICD-10-CM | POA: Diagnosis not present

## 2023-12-02 DIAGNOSIS — R293 Abnormal posture: Secondary | ICD-10-CM | POA: Diagnosis not present

## 2023-12-02 DIAGNOSIS — M5459 Other low back pain: Secondary | ICD-10-CM | POA: Diagnosis not present

## 2023-12-02 DIAGNOSIS — R278 Other lack of coordination: Secondary | ICD-10-CM | POA: Diagnosis not present

## 2023-12-02 DIAGNOSIS — R471 Dysarthria and anarthria: Secondary | ICD-10-CM | POA: Diagnosis not present

## 2023-12-02 DIAGNOSIS — R41842 Visuospatial deficit: Secondary | ICD-10-CM

## 2023-12-02 DIAGNOSIS — R41840 Attention and concentration deficit: Secondary | ICD-10-CM

## 2023-12-02 DIAGNOSIS — R2689 Other abnormalities of gait and mobility: Secondary | ICD-10-CM | POA: Diagnosis not present

## 2023-12-02 DIAGNOSIS — Z9181 History of falling: Secondary | ICD-10-CM | POA: Diagnosis not present

## 2023-12-02 DIAGNOSIS — M6281 Muscle weakness (generalized): Secondary | ICD-10-CM | POA: Diagnosis not present

## 2023-12-02 DIAGNOSIS — R2681 Unsteadiness on feet: Secondary | ICD-10-CM | POA: Diagnosis not present

## 2023-12-02 NOTE — Therapy (Signed)
OUTPATIENT OCCUPATIONAL THERAPY PARKINSON'S TREATMENT  Patient Name: Edward Briggs MRN: [id number] DOB:08/31/1951, 73 y.o., male Today's Date: 12/02/2023  PCP: [name], MD REFERRING PROVIDER: Windell Norfolk, MD   END OF SESSION:  OT End of Session - 12/02/23 1236     Visit Number 3    Number of Visits 16    Date for OT Re-Evaluation 01/21/24    Authorization Type [employer] Medicare    Progress Note Due on Visit 10    OT Start Time 1235    OT Stop Time 1318    OT Time Calculation (min) 43 min    Activity Tolerance Patient tolerated treatment well    Behavior During Therapy WFL for tasks assessed/performed             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   Past Surgical History:  Procedure Laterality Date   ABSCESS DRAINAGE     COLONOSCOPY     ~ 10 yrs ago   FRACTURE SURGERY     HARDWARE REMOVAL  03/26/2012   Procedure: HARDWARE REMOVAL;  Surgeon: Sherri Rad, MD;  Location: [facility];  Service: Orthopedics;  Laterality: Left;  hardware removal deep left ankle syndesmotic screws only and stress xrays ankle    ORIF ANKLE FRACTURE  12/03/2011   Procedure: OPEN REDUCTION INTERNAL FIXATION (ORIF) ANKLE FRACTURE;  Surgeon: Sherri Rad, MD;  Location: [facility];  Service: Orthopedics;  Laterality: Left;  left medial malleolus fracture   PILONIDAL CYST EXCISION  ~1982   SKIN GRAFT  ~1970   Lt great toe (graft from Lt thigh)   Patient Active Problem List   Diagnosis Date Noted   HTN (hypertension) 07/22/2019   Acute metabolic encephalopathy 07/22/2019   AKI (acute kidney injury) (HCC) 07/22/2019   Sepsis (HCC) 07/22/2019   UTI (urinary tract infection) 07/22/2019   Diabetes mellitus without complication (HCC) 09/10/2012   CHEST PAIN 07/21/2009    ONSET  DATE: 11/17/2023   REFERRING DIAG: G20.A1 (ICD-10-CM) - Parkinson's disease without dyskinesia, unspecified whether manifestations fluctuate (HCC)  Note: OT eval/treat for fine motor deficits from Parkinson Disease.   THERAPY DIAG:  Muscle weakness (generalized)  Other lack of coordination  Other symptoms and signs involving the nervous system  Attention and concentration deficit  Visuospatial deficit  Rationale for Evaluation and Treatment: Rehabilitation  SUBJECTIVE:   SUBJECTIVE STATEMENT: Pt reports he would like to work on his writing today.   Pt accompanied by: significant other  PERTINENT HISTORY: PMH: HTN, Diabetes, HLD   PRECAUTIONS: Fall and Other: Cognitive decline  WEIGHT BEARING RESTRICTIONS: No  PAIN:  Are you having pain?  Chronic low back pain  FALLS: Has patient fallen in last 6 months? Yes. Number of falls 1  LIVING ENVIRONMENT: Lives with: lives with their spouse and grown son Lives in: House Stairs: Yes: Internal: 10 steps; on right going up and External: 4 steps; on right going up Has following equipment at home: None  PLOF:  mod assist dressing  PATIENT GOALS: improve handwriting  OBJECTIVE:  Note: Objective measures were completed at Evaluation unless otherwise noted.  HAND DOMINANCE: Right  ADLs: Eating: trouble getting food on utensil, bending down to eat Grooming: mod I  UB Dressing: mod assist LB Dressing:  mod to max assist (fluctuating assistance needed)  Toileting: mod I  Bathing: mod I  Tub Shower transfers: mod I - question safety  Equipment: none  IADLs: Shopping: wife mostly does, however pt does go with wife on occasion Light housekeeping: does laundry but wife folds clothes, pt takes trash out, occasionally vacuums Meal Prep: pt's son does most cooking however pt does breakfast Community mobility: pt does not drive Medication management: pt is doing w/ visual cues Financial management: wife does  Handwriting:  Moderate micrographia, difficulty forming the letters (apraxia?)   MOBILITY STATUS: Independent  POSTURE COMMENTS:  rounded shoulders (slightly)    FUNCTIONAL OUTCOME MEASURES: From screen on 11/17/23 Fastening/unfastening 3 buttons: = 47 sec.  Physical performance test: PPT#2 (simulated eating) = 29 sec PPT#4 (donning/doffing jacket): = 1:18 sec  COORDINATION: From screen on 11/17/23 9 Hole Peg test: Right: 64 sec; Left: 53 sec Box and Blocks:  Right 16 blocks, Left 27 blocks No tremors reported or observed, but pt later reports occasionally on Lt side  UE ROM:   Lt shoulder slightly stiffer, lacks full elbow extension on Lt and decreased supination bilaterally   SENSATION: Denies change    COGNITION: Overall cognitive status:  impaired spatial relations and insight into task completion, memory impairments , difficulty following commands,   OBSERVATIONS: Bradykinesia and Hypokinesia, 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 Pt unable to copy sentence to graph paper. Greater accuracy tracing letters isolating each block individually as well as pointing to boxes. Pt demonstrating good compensatory strategy to print each letter in uppercase. Pt also practiced tracing first name. OT educated pt and wife on how to practice writing at home with additional graph paper.    PATIENT EDUCATION: Education details: writing strategies Person educated: Patient and Spouse Education method: Explanation, Demonstration, Tactile cues, Verbal cues, and Handouts Education comprehension: verbalized understanding, verbal cues required, tactile cues required, and needs further education  HOME EXERCISE PROGRAM: 11/23/23: hip stretch, bag HEP  GOALS: Goals reviewed with patient? Yes  SHORT TERM GOALS:  Target date: 12/24/23  Independent with PD specific HEP   Baseline: Goal status: INITIAL  2.  Pt will verbalize understanding of adaptive strategies to increase ease with ADLS/IADLS  (including: eating, dressing, writing) Baseline:  Goal status: IN PROGRESS  3.  Pt will copy a simple sentence with no significant decrease in size and maintain 90% legibility  Baseline:  Goal status: IN PROGRESS  4.  Pt will demonstrate increased ease with dressing as evidenced by decreasing PPT#4 (don/ doff jacket) to 60 secs or less  Baseline: 1:18 sec Goal status: INITIAL   LONG TERM GOALS: Target date: 01/21/24  Pt to only require min assist for UE dressing and mod assist for LE  dressing Baseline:  Goal status: INITIAL  2.  Pt will verbalize understanding of ways to prevent future PD related complications and appropriate community resources prn  Baseline:  Goal status: INITIAL  3.  Pt will verbalize understanding of ways to maintain/enhance thinking skills as able and ways to compensate for STM changes Baseline:  Goal status: INITIAL  4.  Pt will demonstrate improved fine motor coordination for ADLs as evidenced by decreasing 9 hole peg test score for Rt hand by 5 secs or more Baseline: 64 sec Goal status: INITIAL  5.  Pt will demonstrate improved fine motor coordination for ADLs as evidenced by decreasing 9 hole peg test score for Lt hand by 3 secs  Baseline: 53 sec Goal status: INITIAL  6.  Improve RUE function as evidenced by increasing Box & Blocks by 4 blocks  Baseline: 29 blocks Goal status: INITIAL  ASSESSMENT:  CLINICAL IMPRESSION: Given patient's cognitive impairments, would recommend focusing on ability to write name vs a sentence or paragraph. 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   PERFORMANCE DEFICITS: in functional skills including ADLs, IADLs,  coordination, dexterity, ROM, strength, flexibility, Fine motor control, Gross motor control, mobility, body mechanics, endurance, decreased knowledge of precautions, decreased knowledge of use of DME, and UE functional use, cognitive skills including learn, memory, orientation, perception, problem solving, safety awareness, sequencing, thought, and understand,   IMPAIRMENTS: are limiting patient from ADLs, IADLs, and social participation.   COMORBIDITIES:  may have co-morbidities  that affects occupational performance. Patient will benefit from skilled OT to address above impairments and improve overall function.  REHAB POTENTIAL: Fair cognitive deficits  PLAN:  OT FREQUENCY: 2x/week  OT DURATION: 8 weeks (anticipate d/c after 6 weeks)   PLANNED INTERVENTIONS: 97535 self care/ADL training, 09811 therapeutic exercise, 97530 therapeutic activity, 97112 neuromuscular re-education, 97140 manual therapy, passive range of motion, functional mobility training, visual/perceptual remediation/compensation, patient/family education, and DME and/or AE instructions  RECOMMENDED OTHER SERVICES: none at this time  CONSULTED AND AGREED WITH PLAN OF CARE: Patient and family member/caregiver  PLAN FOR NEXT SESSION: continue to reinforce ADL strategies, safety, work on [employer]! Up seated and from sit to stand for posture and more efficiency/safety with transfers.   Focus writing efforts on writing first name only  Delana Meyer, OT 12/02/2023, 1:42 PM

## 2023-12-06 ENCOUNTER — Other Ambulatory Visit: Payer: Self-pay | Admitting: Family Medicine

## 2023-12-06 DIAGNOSIS — E1165 Type 2 diabetes mellitus with hyperglycemia: Secondary | ICD-10-CM

## 2023-12-07 ENCOUNTER — Ambulatory Visit: Payer: Medicare Other | Attending: Neurology | Admitting: Physical Therapy

## 2023-12-07 ENCOUNTER — Encounter: Payer: Self-pay | Admitting: Physical Therapy

## 2023-12-07 ENCOUNTER — Encounter: Payer: Self-pay | Admitting: Occupational Therapy

## 2023-12-07 ENCOUNTER — Ambulatory Visit: Payer: Medicare Other

## 2023-12-07 ENCOUNTER — Ambulatory Visit: Payer: Medicare Other | Admitting: Occupational Therapy

## 2023-12-07 DIAGNOSIS — R41842 Visuospatial deficit: Secondary | ICD-10-CM

## 2023-12-07 DIAGNOSIS — Z9181 History of falling: Secondary | ICD-10-CM | POA: Insufficient documentation

## 2023-12-07 DIAGNOSIS — R41841 Cognitive communication deficit: Secondary | ICD-10-CM

## 2023-12-07 DIAGNOSIS — R2689 Other abnormalities of gait and mobility: Secondary | ICD-10-CM | POA: Insufficient documentation

## 2023-12-07 DIAGNOSIS — R29818 Other symptoms and signs involving the nervous system: Secondary | ICD-10-CM

## 2023-12-07 DIAGNOSIS — R2681 Unsteadiness on feet: Secondary | ICD-10-CM

## 2023-12-07 DIAGNOSIS — R471 Dysarthria and anarthria: Secondary | ICD-10-CM | POA: Insufficient documentation

## 2023-12-07 DIAGNOSIS — M6281 Muscle weakness (generalized): Secondary | ICD-10-CM | POA: Insufficient documentation

## 2023-12-07 DIAGNOSIS — R278 Other lack of coordination: Secondary | ICD-10-CM | POA: Diagnosis not present

## 2023-12-07 DIAGNOSIS — R41840 Attention and concentration deficit: Secondary | ICD-10-CM

## 2023-12-07 DIAGNOSIS — R293 Abnormal posture: Secondary | ICD-10-CM | POA: Insufficient documentation

## 2023-12-07 NOTE — Patient Instructions (Signed)
Sit to Stand Transfers:  Scoot out to the edge of the chair Place your feet flat on the floor, shoulder width apart.  Make sure your feet are tucked just under your knees. Lean forward (nose over toes) with momentum, and stand up tall with your best posture.  If you need to use your arms, use them as a quick boost up to stand. If you are in a low or soft chair, you can lean back and then forward up to stand, in order to get more momentum. Once you are standing, make sure you are looking ahead and standing tall.  To sit down:  Back up until you feel the chair behind your legs. Bend at you hips, reaching  Back for you chair, if needed, then slowly squat to sit down on your chair.

## 2023-12-07 NOTE — Therapy (Signed)
OUTPATIENT PHYSICAL THERAPY NEURO TREATMENT   Patient Name: Edward Briggs MRN: [id number] DOB:1951/08/12, 73 y.o., male Today's Date: 12/07/2023   PCP: [name], MD REFERRING PROVIDER: Windell Norfolk, MD  END OF SESSION:  PT End of Session - 12/07/23 1318     Visit Number 3    Number of Visits 7   Plus eval   Date for PT Re-Evaluation 01/18/24    Authorization Type [employer] Medicare    PT Start Time 1317    PT Stop Time 1359    PT Time Calculation (min) 42 min    Equipment Utilized During Treatment Gait belt    Activity Tolerance Patient tolerated treatment well    Behavior During Therapy WFL for tasks assessed/performed             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   Past Surgical History:  Procedure Laterality Date   ABSCESS DRAINAGE     COLONOSCOPY     ~ 10 yrs ago   FRACTURE SURGERY     HARDWARE REMOVAL  03/26/2012   Procedure: HARDWARE REMOVAL;  Surgeon: Sherri Rad, MD;  Location: [facility];  Service: Orthopedics;  Laterality: Left;  hardware removal deep left ankle syndesmotic screws only and stress xrays ankle    ORIF ANKLE FRACTURE  12/03/2011   Procedure: OPEN REDUCTION INTERNAL FIXATION (ORIF) ANKLE FRACTURE;  Surgeon: Sherri Rad, MD;  Location: [facility];  Service: Orthopedics;  Laterality: Left;  left medial malleolus fracture   PILONIDAL CYST EXCISION  ~1982   SKIN GRAFT  ~1970   Lt great toe (graft from Lt thigh)   Patient Active Problem List   Diagnosis Date Noted   HTN (hypertension) 07/22/2019   Acute metabolic encephalopathy 07/22/2019   AKI (acute kidney injury) (HCC) 07/22/2019   Sepsis (HCC) 07/22/2019   UTI (urinary tract infection) 07/22/2019   Diabetes mellitus without complication (HCC) 09/10/2012   CHEST PAIN  07/21/2009    ONSET DATE: 11/17/2023 (referral)   REFERRING DIAG: G20.A1 (ICD-10-CM) - Parkinson's disease without dyskinesia, unspecified whether manifestations fluctuate (HCC)  THERAPY DIAG:  Muscle weakness (generalized)  Other lack of coordination  Other symptoms and signs involving the nervous system  Rationale for Evaluation and Treatment: Rehabilitation  SUBJECTIVE:  SUBJECTIVE STATEMENT: Excited for this weekend for the [employer]. No falls.   Pt accompanied by: significant other (wife, [name])   PERTINENT HISTORY: HTN, Diabetes, HLD  PAIN:  Are you having pain? Yes: NPRS scale: 6-7/10 Pain location: Low back pain  Pain description: achy/throbbing  Aggravating factors: getting into/out of bed, standing for too long Relieving factors: "it comes and goes"   PRECAUTIONS: Fall  RED FLAGS: None   WEIGHT BEARING RESTRICTIONS: No  FALLS: Has patient fallen in last 6 months? Yes. Number of falls 1  LIVING ENVIRONMENT: Lives with: lives with their spouse Lives in: House/apartment Stairs: Yes: Internal: full flight steps; on left going up and External: 4 steps; on left going up Has following equipment at home: None  PLOF: Needs assistance with ADLs (difficulty w/dressing)  PATIENT GOALS: "Balance"   OBJECTIVE:  Note: Objective measures were completed at Evaluation unless otherwise noted.  DIAGNOSTIC FINDINGS: DaT Scan from 04/09/23 FINDINGS: Decreased radiotracer activity in the posterior RIGHT putamen. Mild decreased activity in the head of the LEFT caudate nucleus compared to the RIGHT. Relatively normal shape of the LEFT striatum   IMPRESSION: Decreased radiotracer activity posterior RIGHT putamen is a pattern which could indicate Parkinsonian syndrome pathology.   Of  note, DaTSCAN is not diagnostic of Parkinsonian syndromes, which remains a clinical diagnosis. DaTscan is an adjuvant test to aid in the clinical diagnosis of Parkinsonian syndromes.  COGNITION: Overall cognitive status: Impaired   SENSATION: Pt denies numbness/tingling   COORDINATION: Decreased amplitude of movement w/finger flicks (LUE>RUE). No dysdiadokinesia   Heel to shin test: Decreased ROM bilaterally    POSTURE: rounded shoulders, forward head, and weight shift right  LOWER EXTREMITY ROM:     Active  Right Eval Left Eval  Hip flexion    Hip extension    Hip abduction    Hip adduction    Hip internal rotation    Hip external rotation    Knee flexion    Knee extension    Ankle dorsiflexion    Ankle plantarflexion    Ankle inversion    Ankle eversion     (Blank rows = not tested)  LOWER EXTREMITY MMT:  Tested in seated position   MMT Right Eval Left Eval  Hip flexion 4 4-  Hip extension    Hip abduction 4+ 4+  Hip adduction 4+ 4+  Hip internal rotation    Hip external rotation    Knee flexion 5 4-  Knee extension 5 4-  Ankle dorsiflexion 5 5  Ankle plantarflexion    Ankle inversion    Ankle eversion    (Blank rows = not tested)  BED MOBILITY:  Pt reports more difficulty to get into bed than out. Bed is tall and pt's back pain affects him    TRANSFERS: Assistive device utilized: None  Sit to stand: SBA Stand to sit: SBA Chair to chair: Min A (pt reaching for chair prior to fully turning to sit down, resulting in chair tipping and pt landing at very edge of seat)  TREATMENT DATE:    GAIT: Gait pattern: step to pattern, decreased arm swing- Right, decreased arm swing- Left, decreased stride length, decreased ankle dorsiflexion- Right, decreased ankle dorsiflexion- Left, shuffling, festinating, lateral hip instability,  decreased trunk rotation, trunk flexed, narrow BOS, poor foot clearance- Right, and poor foot clearance- Left Distance walked: 230' x 1 with RW, small clinic distances with no AD   Assistive device utilized: None, 2 wheeled RW  Level of assistance: CGA Comments: With small distances with no AD during session, cues for posture, arm swing and step length and CGA  Continued to try RW for improved safety/stability with gait, pt needing max cues throughout 230' of gait for staying inside RW and for incr stride length/foot clearance. Pt did demo improved stride length with gait with RW today compared to previous session when doing straightaways, pt still with decr step length when going around curves. Practiced 40' with 4 cones with pt weaving in and out to work on obstacle negotiations/curves. Pt needing max cues for staying close to RW and how to turn RW with pt intermittently bumping into cones. When ambulating around the gym, pt did hit the blue physioball on the right 2 times.  When going to turn to sit back down to mat table, pt with significant difficulty sequencing RW to turn and sit to mat table, needing max verbal/demo cues from therapist and taking incr time to perform. During session with no AD, pt also needs cues to make sure that he turns all the way and to feel both legs on the mat before sitting down. At one point pt about 45 degrees away from that mat before he almost tried to sit down.   Discussed that RW is good for straight distances, but pt still with difficulty with sequencing with going around turns or backing up to the mat table. Will need to continue to practice this with pt.    Therapeutic Activity:  Access Code: [email or web address] URL: [email or web address] Date: 12/07/2023 Prepared by: Sherlie Ban  Reviewed and updated HEP and provided new handout: See MedBridge for further details   Exercises - Sit to Stand Without Arm Support  - 1 x daily - 7 x weekly - 2 sets -  10 reps - with focus on incr forward lean and standing up with tall posture and scap retraction, pt initially needing UE support and then able to perform without  - Standing Reach to Opposite Side with Weight Shift  - 1 x daily - 7 x weekly - 3 sets - 10 reps - needing demo cues from therapist for tall posture in the middle and reaching to outside of foot  - Seated Windmill Trunk Rotation Stretch  - 1 x daily - 7 x weekly - 3 sets - 10 reps - performed in the corner, pt reporting he likes doing this one at home  - Step sideways with arms reaching at counter   - 1 x daily - 7 x weekly - 2 sets - 10 reps - with focus on big step and reaching    PATIENT EDUCATION: Education details: Reviewed HEP, importance of performing at home and provided new handout  Person educated: Patient and Spouse Education method: Explanation, Demonstration, Verbal cues, and Handouts Education comprehension: verbalized understanding, returned demonstration, verbal cues required, and needs further education  HOME EXERCISE PROGRAM: Access Code: [email or web address] URL: [email or web address] Date: 12/07/2023 Prepared by: Sherlie Ban  Exercises - Sit to Stand Without Arm Support  - 1 x daily - 7 x  weekly - 2 sets - 10 reps - Standing Reach to Opposite Side with Weight Shift  - 1 x daily - 7 x weekly - 3 sets - 10 reps - Seated Windmill Trunk Rotation Stretch  - 1 x daily - 7 x weekly - 3 sets - 10 reps - Step sideways with arms reaching at counter   - 1 x daily - 7 x weekly - 2 sets - 10 reps   GOALS: Goals reviewed with patient? Yes  SHORT TERM GOALS: Target date: 12/21/2023   Pt will be independent with initial HEP for improved strength, balance, transfers and gait.  Baseline: started to review on eval  Goal status: INITIAL  2.  Pt will improve 5 x STS to less than or equal to 35 seconds w/proper body mechanics to demonstrate improved functional strength and transfer efficiency.   Baseline: 39.56s  w/intermittent UE support and flexed posture  Goal status: INITIAL  3.  Pt will improve normal TUG to less than or equal to 14 seconds w/LRAD and CGA for improved functional mobility and decreased fall risk.  Baseline: 14.34s no AD and min A  Goal status: INITIAL  4.  Pt and wife will verbalize and demonstrate fall prevention techniques and freezing strategies to implement at home and in community for reduced fall frequency.  Baseline:  Goal status: INITIAL   LONG TERM GOALS: Target date: 01/04/2024   Pt will be independent with final HEP for improved strength, balance, transfers and gait.  Baseline:  Goal status: INITIAL  2.  Pt will improve 5 x STS to less than or equal to 28 seconds w/proper body mechanics to demonstrate improved functional strength and transfer efficiency.   Baseline: 39.56s w/intermittent UE support and flexed posture  Goal status: INITIAL  3.  Pt will improve normal TUG to less than or equal to 12 seconds w/LRAD and SBA for improved functional mobility and decreased fall risk.  Baseline: 14.34s no AD and min A  Goal status: INITIAL  4.  Pt will verbalize understanding of local PD community resources, including fitness post DC.   Baseline:  Goal status: INITIAL   ASSESSMENT:  CLINICAL IMPRESSION: Continued to practice with RW today with pt doing better with straightaways in the gym with continuous cues for incr stride length throughout, but continued shortened steps around curves. Pt with significant difficulties with trying to do obstacle negotiation with cones and when having to turn RW to sit down on the mat table. Will continue to practice to determine safest means of ambulation for pt. Pt with difficulty with RW due to cognitive deficits. Remainder of session focused on reviewing previous HEP and provided updated handout. Cues needed for technique and larger amplitude movement patterns. Will continue per POC.     OBJECTIVE IMPAIRMENTS: Abnormal  gait, decreased activity tolerance, decreased balance, decreased cognition, decreased knowledge of use of DME, decreased mobility, difficulty walking, decreased strength, decreased safety awareness, improper body mechanics, and pain.   ACTIVITY LIMITATIONS: carrying, lifting, transfers, bed mobility, and locomotion level  PARTICIPATION LIMITATIONS: cleaning, laundry, medication management, interpersonal relationship, driving, shopping, community activity, and yard work  PERSONAL FACTORS: Fitness and 1 comorbidity: PD  are also affecting patient's functional outcome.   REHAB POTENTIAL: Fair due to PD prognosis  CLINICAL DECISION MAKING: Evolving/moderate complexity  EVALUATION COMPLEXITY: Moderate  PLAN:  PT FREQUENCY: 1x/week  PT DURATION: 6 weeks  PLANNED INTERVENTIONS: 97164- PT Re-evaluation, 97110-Therapeutic exercises, 97530- Therapeutic activity, [id number]- Neuromuscular re-education, 97535- Self Care, [id number]-  Manual therapy, [id number]- Gait training, 62130- Aquatic Therapy, Patient/Family education, Balance training, Stair training, Dry Needling, and DME instructions  PLAN FOR NEXT SESSION: Review HEP and update as appropriate. Educate on freezing strategies and fall prevention. Increased step length, BOS and postural control   Keep practicing with RW     Drake Leach, PT, DPT 12/07/2023, 2:08 PM

## 2023-12-07 NOTE — Therapy (Signed)
OUTPATIENT OCCUPATIONAL THERAPY [facility]  Patient Name: Edward Briggs MRN: [id number] DOB:[birth year], 73 y.o., male Today's Date: 12/07/2023  PCP: [name], MD REFERRING PROVIDER: Windell Norfolk, MD   END OF SESSION:  OT End of Session - 12/07/23 1412     Visit Number 4    Number of Visits 16    Date for OT Re-Evaluation 01/21/24    Authorization Type [employer] Medicare    Progress Note Due on Visit 10    OT Start Time 1402    OT Stop Time 1445    OT Time Calculation (min) 43 min    Activity Tolerance Patient tolerated treatment well    Behavior During Therapy WFL for tasks assessed/performed             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   Past Surgical History:  Procedure Laterality Date   ABSCESS DRAINAGE     COLONOSCOPY     ~ 10 yrs ago   FRACTURE SURGERY     HARDWARE REMOVAL  03/26/2012   Procedure: HARDWARE REMOVAL;  Surgeon: Sherri Rad, MD;  Location: [facility];  Service: Orthopedics;  Laterality: Left;  hardware removal deep left ankle syndesmotic screws only and stress xrays ankle    ORIF ANKLE FRACTURE  12/03/2011   Procedure: OPEN REDUCTION INTERNAL FIXATION (ORIF) ANKLE FRACTURE;  Surgeon: Sherri Rad, MD;  Location: [facility];  Service: Orthopedics;  Laterality: Left;  left medial malleolus fracture   PILONIDAL CYST EXCISION  ~1982   SKIN GRAFT  ~1970   Lt great toe (graft from Lt thigh)   Patient Active Problem List   Diagnosis Date Noted   HTN (hypertension) 07/22/2019   Acute metabolic encephalopathy 07/22/2019   AKI (acute kidney injury) (HCC) 07/22/2019   Sepsis (HCC) 07/22/2019   UTI (urinary tract infection) 07/22/2019   Diabetes mellitus without complication (HCC) 09/10/2012   CHEST PAIN 07/21/2009    ONSET  DATE: 11/17/2023   REFERRING DIAG: G20.A1 (ICD-10-CM) - Parkinson's disease without dyskinesia, unspecified whether manifestations fluctuate (HCC)  Note: OT eval/treat for fine motor deficits from Parkinson Disease.   THERAPY DIAG:  Muscle weakness (generalized)  Other lack of coordination  Other symptoms and signs involving the nervous system  Attention and concentration deficit  Visuospatial deficit  Unsteadiness on feet  Abnormal posture  Rationale for Evaluation and Treatment: Rehabilitation  SUBJECTIVE:   SUBJECTIVE STATEMENT: Pt reports no new pain or falls  Pt accompanied by: significant other  PERTINENT HISTORY: PMH: HTN, Diabetes, HLD   PRECAUTIONS: Fall and Other: Cognitive decline  WEIGHT BEARING RESTRICTIONS: No  PAIN:  Are you having pain?  Chronic low back pain  FALLS: Has patient fallen in last 6 months? Yes. Number of falls 1  LIVING ENVIRONMENT: Lives with: lives with their spouse and grown son Lives in: House Stairs: Yes: Internal: 10 steps; on right going up and External: 4 steps; on right going up Has following equipment at home: None  PLOF:  mod assist dressing  PATIENT GOALS: improve handwriting  OBJECTIVE:  Note: Objective measures were completed at Evaluation unless otherwise noted.  HAND DOMINANCE: Right  ADLs: Eating: trouble getting food on utensil, bending down to eat Grooming: mod I  UB Dressing: mod assist  LB Dressing: mod to max assist (fluctuating assistance needed)  Toileting: mod I  Bathing: mod I  Tub Shower transfers: mod I - question safety  Equipment: none  IADLs: Shopping: wife mostly does, however pt does go with wife on occasion Light housekeeping: does laundry but wife folds clothes, pt takes trash out, occasionally vacuums Meal Prep: pt's son does most cooking however pt does breakfast Community mobility: pt does not drive Medication management: pt is doing w/ visual cues Financial management: wife  does  Handwriting: Moderate micrographia, difficulty forming the letters (apraxia?)   MOBILITY STATUS: Independent  POSTURE COMMENTS:  rounded shoulders (slightly)    FUNCTIONAL OUTCOME MEASURES: From screen on 11/17/23 Fastening/unfastening 3 buttons: = 47 sec.  Physical performance test: PPT#2 (simulated eating) = 29 sec PPT#4 (donning/doffing jacket): = 1:18 sec  COORDINATION: From screen on 11/17/23 9 Hole Peg test: Right: 64 sec; Left: 53 sec Box and Blocks:  Right 16 blocks, Left 27 blocks No tremors reported or observed, but pt later reports occasionally on Lt side  UE ROM:   Lt shoulder slightly stiffer, lacks full elbow extension on Lt and decreased supination bilaterally   SENSATION: Denies change    COGNITION: Overall cognitive status:  impaired spatial relations and insight into task completion, memory impairments , difficulty following commands,   OBSERVATIONS: Bradykinesia and Hypokinesia, appears to have apraxia                                                                                                                     TREATMENT DATE:  Practiced writing first name in print again today w/ fluctuating performance. Pt did not do as well tracing as copying today. Pt also w/ difficulty keeping on line and spacing. Attempted strategy of highlighting within line for visual cue however pt then worse with apraxia.   Pt practiced PWR! Up from sit to stand, then getting up and down from sitting at table with max simple directional cues for strategies.   Donned and doffed socks and shoes in chair using foot stool to tie/untie shoes w/ cues and occasional assist.  Pt's wife reports he has been sitting on top step to don/doff shoes and socks but therapist recommended sitting in chair for safety as therapist concerned he may trip and fall down flight of stairs w/ transfer to/from sitting on step.   Discussed donning/doffing shirt w/ strategies and overall recommended  consistency both in how he performs task and his daily routine for greater carryover  PATIENT EDUCATION: Education details: reinforcing ADL strategies for dressing and writing Person educated: Patient and Spouse Education method: Explanation, Demonstration, Tactile cues, Verbal cues, and Handouts Education comprehension: verbalized understanding, verbal cues required, tactile cues required, and needs further education  HOME EXERCISE PROGRAM: 11/23/23: hip stretch, bag HEP 11/30/23: Big ADL strategies  GOALS: Goals reviewed with patient? Yes  SHORT TERM GOALS: Target date: 12/24/23  Independent with PD specific HEP   Baseline: Goal status: IN PROGRESS  2.  Pt will verbalize  understanding of adaptive strategies to increase ease with ADLS/IADLS  (including: eating, dressing, writing) Baseline:  Goal status: IN PROGRESS  3.  Pt will copy first name with no significant decrease in size and maintain 90% legibility  Baseline:  Goal status: REVISED  4.  Pt will demonstrate increased ease with dressing as evidenced by decreasing PPT#4 (don/ doff jacket) to 60 secs or less  Baseline: 1:18 sec Goal status: INITIAL   LONG TERM GOALS: Target date: 01/21/24  Pt to only require min assist for UE dressing and mod assist for LE dressing Baseline:  Goal status: INITIAL  2.  Pt will verbalize understanding of ways to prevent future PD related complications and appropriate community resources prn  Baseline:  Goal status: INITIAL  3.  Pt will verbalize understanding of ways to maintain/enhance thinking skills as able and ways to compensate for STM changes Baseline:  Goal status: INITIAL  4.  Pt will demonstrate improved fine motor coordination for ADLs as evidenced by decreasing 9 hole peg test score for Rt hand by 5 secs or more Baseline: 64 sec Goal status: INITIAL  5.  Pt will demonstrate improved fine motor coordination for ADLs as evidenced by decreasing 9 hole peg test score for Lt  hand by 3 secs  Baseline: 53 sec Goal status: INITIAL  6.  Improve RUE function as evidenced by increasing Box & Blocks by 4 blocks  Baseline: 29 blocks Goal status: INITIAL  ASSESSMENT:  CLINICAL IMPRESSION: Given patient's cognitive impairments, would recommend focusing on ability to write name vs a sentence or paragraph. 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   PERFORMANCE DEFICITS: in functional skills including ADLs, IADLs, coordination, dexterity, ROM, strength, flexibility, Fine motor control, Gross motor control, mobility, body mechanics, endurance, decreased knowledge of precautions, decreased knowledge of use of DME, and UE functional use, cognitive skills including learn, memory, orientation, perception, problem solving, safety awareness, sequencing, thought, and understand,   IMPAIRMENTS: are limiting patient from ADLs, IADLs, and social participation.   COMORBIDITIES:  may have co-morbidities  that affects occupational performance. Patient will benefit from skilled OT to address above impairments and improve overall function.  REHAB POTENTIAL: Fair cognitive deficits  PLAN:  OT FREQUENCY: 2x/week  OT DURATION: 8 weeks (anticipate d/c after 6 weeks)   PLANNED INTERVENTIONS: 97535 self care/ADL training, 60454 therapeutic exercise, 97530 therapeutic activity, 97112 neuromuscular re-education, 97140 manual therapy, passive range of motion, functional mobility training, visual/perceptual remediation/compensation, patient/family education, and DME and/or AE instructions  RECOMMENDED OTHER SERVICES: none at this time  CONSULTED AND AGREED WITH PLAN OF CARE: Patient and family member/caregiver  PLAN FOR NEXT SESSION: reinforce cape method for jacket (provided handout), issue coordination HEP (keeping it simple 2-4 exercises max)   Sheran Lawless, OT 12/07/2023,  2:12 PM

## 2023-12-07 NOTE — Therapy (Signed)
OUTPATIENT SPEECH LANGUAGE PATHOLOGY [facility]   Patient Name: Edward Briggs MRN: [id number] DOB:08/30/1951, 73 y.o., male Today's Date: 12/07/2023  PCP: [name], MD REFERRING PROVIDER: [name], MD  END OF SESSION:  End of Session - 12/07/23 1603     Visit Number 2    Number of Visits 17    Date for SLP Re-Evaluation 01/25/24    Authorization Type [employer] Medicare    SLP Start Time 1237    SLP Stop Time  1315    SLP Time Calculation (min) 38 min    Activity Tolerance Patient tolerated treatment well              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   Past Surgical History:  Procedure Laterality Date   ABSCESS DRAINAGE     COLONOSCOPY     ~ 10 yrs ago   FRACTURE SURGERY     HARDWARE REMOVAL  03/26/2012   Procedure: HARDWARE REMOVAL;  Surgeon: Sherri Rad, MD;  Location: [facility];  Service: Orthopedics;  Laterality: Left;  hardware removal deep left ankle syndesmotic screws only and stress xrays ankle    ORIF ANKLE FRACTURE  12/03/2011   Procedure: OPEN REDUCTION INTERNAL FIXATION (ORIF) ANKLE FRACTURE;  Surgeon: Sherri Rad, MD;  Location: [facility];  Service: Orthopedics;  Laterality: Left;  left medial malleolus fracture   PILONIDAL CYST EXCISION  ~1982   SKIN GRAFT  ~1970   Lt great toe (graft from Lt thigh)   Patient Active Problem List   Diagnosis Date Noted   HTN (hypertension) 07/22/2019   Acute metabolic encephalopathy 07/22/2019   AKI (acute kidney injury) (HCC) 07/22/2019   Sepsis (HCC) 07/22/2019   UTI (urinary tract infection) 07/22/2019   Diabetes mellitus without complication (HCC) 09/10/2012   CHEST PAIN 07/21/2009    ONSET DATE: 11/17/2023  REFERRING DIAG: Parkinson's  THERAPY DIAG: Dysarthria and  anarthria (R47.1) and Cognitive communication deficit (R41.841)   Rationale for Evaluation and Treatment: Rehabilitation  SUBJECTIVE:   SUBJECTIVE STATEMENT: Pt presents with low conversational volume and imprecise articulation resulting in frequent requested repetition and clarification from SLP Pt accompanied by: significant other, [name]   PERTINENT HISTORY: PMH: HTN, Diabetes, HLD   PAIN: Are you having pain? Yes, 6-7/10 Location: lower back Description: aching  FALLS: Has patient fallen in last 6 months? No  LIVING ENVIRONMENT: Lives with: lives with their family Lives in: House/apartment  PLOF:  Level of assistance: Needed assistance with ADLs, Needed assistance with IADLS Employment: Retired  PATIENT GOALS: increase communication effectiveness   OBJECTIVE:  Note: Objective measures were completed at Evaluation unless otherwise noted.  TREATMENT DATE:   12/07/23: ST reviewed Speak Out! Program and principles. Targeted improving vocal quality and increasing intensity through progressively difficulty speech tasks using Speak Out! program, lesson 1. ST leads pt through exercises providing usual model prior to pt execution. usual max-A required to achieve target dB this date d/t cognitive limitations. Averages this date:  Warm up average: 78 dB Sustained "ah" 83 dB Glide average: 65 dB with max model Counting exercise average: 75 dB with max model  Side comments: mid 60s dB with usual cues   11/30/23: ST assessed pt's voice and motor speech. Pt presents with low/weakened voice ranging from 48-50 dB in conversation w/ usual cues for increased volume, and impaired intelligibility with imprecise articulation in conversational speech. ST introduced Speak Out! and provided pt education on the importance of speaking with intent, and resources to provide further  knowledge on Parkinson's Disease, the Speak Out! program, and how it will be implemented in future sessions.   PATIENT EDUCATION: Education details: See above Person educated: Patient Education method: Explanation, Demonstration, and Handouts Education comprehension: verbalized understanding and needs further education  HOME EXERCISE PROGRAM: Speak Out! Lessons   GOALS: Goals reviewed with patient? Yes  SHORT TERM GOALS: Target date: 12/28/2023  Pt will complete daily HEP as recommended for 2/2 sessions. Baseline: Goal status: IN PROGRESS   2.  Pt will achieve targeted dB levels on SPEAK OUT! exercises with 80% accuracy given mod-A for 2/2 sessions.  Baseline:  Goal status: IN PROGRESS   3.  Pt will utilize dysarthria compensations during a 5 min conversation x2 to optimize vocal intensity and clarity given mod-A. Baseline:  Goal status: IN PROGRESS    LONG TERM GOALS: Target date: 01/25/2024  Pt will maintain WNL volume during a 10-15 minute conversation in 2/2 sessions with min-A. Baseline:  Goal status: IN PROGRESS   2.  Pt will subjectively report less frequent cues from family/friends to increase volume in conversation across one week. Baseline:  Goal status: IN PROGRESS   3.   Pt will report improved communication effectiveness via PROM by 2 point improvement by last ST session.  Baseline:  Goal status: IN PROGRESS    ASSESSMENT:  CLINICAL IMPRESSION: Patient is a 73 y.o. 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 Conversational volume averaged mid 60s dB, which greatly impacted listener comprehension and resulted in need for repetition and clarification to understand pt. Was stimulable for increased volume and clarity with Speak Out! Principles given max A. Introduced [employer]! Program today. Denied dysphagia or cognitive changes, although some short term recall challenges noted. Pt would benefit from skilled  ST intervention to optimize communication effectiveness.   OBJECTIVE IMPAIRMENTS: Objective impairments include dysarthria and voice disorder. These impairments are limiting patient from effectively communicating at home and in community. Factors affecting potential to achieve goals and functional outcome are co-morbidities. Patient will benefit from skilled SLP services to address above impairments and improve overall function.  REHAB POTENTIAL: Good  PLAN:  SLP FREQUENCY: 2x/week  SLP DURATION: 8 weeks  PLANNED INTERVENTIONS: Language facilitation, Environmental controls, Cueing hierachy, Internal/external aids, Oral motor exercises, Functional tasks, SLP instruction and feedback, Compensatory strategies, Patient/family education, and [id number] Treatment of speech (30 or 45 min)     Gracy Racer, CCC-SLP 12/07/2023, 4:03 PM

## 2023-12-07 NOTE — Patient Instructions (Addendum)
Home Exercises: Warm Up Exercise: A. Project these words: May-Me-My-Moe-Moo (5 times)  2. "Ahh" Exercise:  A. Project "ahhh" as long and strong as you can (5 times)   3. Pitch Glide:  A. Low "ahh" mid "ahh" high "ahh" (5 times)  B. High "ahh" mid "ahh" low "ahh" (5 times)   4. Counting aloud:   A. Say these words in a smooth and connected way    1   2   3    (stop and breathe)  4   5  6   (stop and breathe)  7  8  9   (stop and breathe)  10  11  12   (stop and breathe)

## 2023-12-09 ENCOUNTER — Ambulatory Visit: Payer: Medicare Other

## 2023-12-09 ENCOUNTER — Ambulatory Visit: Payer: Medicare Other | Admitting: Physical Therapy

## 2023-12-09 ENCOUNTER — Ambulatory Visit: Payer: Medicare Other | Admitting: Occupational Therapy

## 2023-12-09 ENCOUNTER — Encounter: Payer: Self-pay | Admitting: Physical Therapy

## 2023-12-09 ENCOUNTER — Encounter: Payer: Self-pay | Admitting: Occupational Therapy

## 2023-12-09 VITALS — BP 99/54 | HR 79

## 2023-12-09 DIAGNOSIS — Z9181 History of falling: Secondary | ICD-10-CM | POA: Diagnosis not present

## 2023-12-09 DIAGNOSIS — R471 Dysarthria and anarthria: Secondary | ICD-10-CM | POA: Diagnosis not present

## 2023-12-09 DIAGNOSIS — R41840 Attention and concentration deficit: Secondary | ICD-10-CM

## 2023-12-09 DIAGNOSIS — R293 Abnormal posture: Secondary | ICD-10-CM | POA: Diagnosis not present

## 2023-12-09 DIAGNOSIS — R2681 Unsteadiness on feet: Secondary | ICD-10-CM

## 2023-12-09 DIAGNOSIS — R2689 Other abnormalities of gait and mobility: Secondary | ICD-10-CM | POA: Diagnosis not present

## 2023-12-09 DIAGNOSIS — R29818 Other symptoms and signs involving the nervous system: Secondary | ICD-10-CM

## 2023-12-09 DIAGNOSIS — R41841 Cognitive communication deficit: Secondary | ICD-10-CM

## 2023-12-09 DIAGNOSIS — M6281 Muscle weakness (generalized): Secondary | ICD-10-CM

## 2023-12-09 DIAGNOSIS — R278 Other lack of coordination: Secondary | ICD-10-CM | POA: Diagnosis not present

## 2023-12-09 NOTE — Therapy (Signed)
 OUTPATIENT SPEECH LANGUAGE PATHOLOGY [facility]   Patient Name: Edward Briggs MRN: [id number] DOB:June 10, 1951, 73 y.o., male Today's Date: 12/09/2023  PCP: Levora Reyes SAUNDERS, MD REFERRING PROVIDER: Levora Reyes SAUNDERS, MD  END OF SESSION:  End of Session - 12/09/23 1229     Visit Number 3    Number of Visits 17    Date for SLP Re-Evaluation 01/25/24    SLP Start Time 1230    SLP Stop Time  1315    SLP Time Calculation (min) 45 min    Activity Tolerance Patient tolerated treatment well               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    Past Surgical History:  Procedure Laterality Date   ABSCESS DRAINAGE     COLONOSCOPY     ~ 10 yrs ago   FRACTURE SURGERY     HARDWARE REMOVAL  03/26/2012   Procedure: HARDWARE REMOVAL;  Surgeon: Deward DELENA Schwartz, MD;  Location: [facility];  Service: Orthopedics;  Laterality: Left;  hardware removal deep left ankle syndesmotic screws only and stress xrays ankle    ORIF ANKLE FRACTURE  12/03/2011   Procedure: OPEN REDUCTION INTERNAL FIXATION (ORIF) ANKLE FRACTURE;  Surgeon: Deward DELENA Schwartz, MD;  Location: [facility];  Service: Orthopedics;  Laterality: Left;  left medial malleolus fracture   PILONIDAL CYST EXCISION  ~1982   SKIN GRAFT  ~1970   Lt great toe (graft from Lt thigh)   Patient Active Problem List   Diagnosis Date Noted   HTN (hypertension) 07/22/2019   Acute metabolic encephalopathy 07/22/2019   AKI (acute kidney injury) (HCC) 07/22/2019   Sepsis (HCC) 07/22/2019   UTI (urinary tract infection) 07/22/2019   Diabetes mellitus without complication (HCC) 09/10/2012   CHEST PAIN 07/21/2009    ONSET DATE: 11/17/2023  REFERRING DIAG: Parkinson's  THERAPY DIAG: Dysarthria and anarthria (R47.1) and Cognitive  communication deficit (R41.841)   Rationale for Evaluation and Treatment: Rehabilitation  SUBJECTIVE:   SUBJECTIVE STATEMENT: Pt presents with low conversational volume and imprecise articulation resulting in frequent requested repetition and clarification from SLP Pt accompanied by: significant other, [name]   PERTINENT HISTORY: PMH: HTN, Diabetes, HLD   PAIN: Are you having pain? Yes, 6-7/10 Location: lower back Description: aching  FALLS: Has patient fallen in last 6 months? No  LIVING ENVIRONMENT: Lives with: lives with their family Lives in: House/apartment  PLOF:  Level of assistance: Needed assistance with ADLs, Needed assistance with IADLS Employment: Retired  PATIENT GOALS: increase communication effectiveness   OBJECTIVE:  Note: Objective measures were completed at Evaluation unless otherwise noted.  TREATMENT DATE:  12/09/23: ST reviewed Speak Out! Program and principles. Targeted improving vocal quality and increasing intensity through progressively difficulty speech tasks using Speak Out! Program, lesson 1 to initiate practice with exercises not yet introduced in prior session. ST lead pt through each exercise providing frequent model prior to pt execution. Usual max-A was required to achieve target dB this date d/t cognitive limitations. Averages this date:  Warm up average: 83 dB Sustained ah: 82 dB Glide average: 78 dB with max model and verbal cues Counting exercise average: 67 dB with model, concise explanation of expectations, and fewer cues Reading Exercise average: 65 dB, 70 dB with max model Cognitive Exercise average: 62 dB, 69 dB with max model  Side comments: low/mid 60's dB with consistent cues  12/07/23: ST reviewed Speak Out! Program and principles. Targeted improving vocal quality and increasing intensity through progressively  difficulty speech tasks using Speak Out! program, lesson 1. ST leads pt through exercises providing usual model prior to pt execution. usual max-A required to achieve target dB this date d/t cognitive limitations. Averages this date:  Warm up average: 78 dB Sustained ah 83 dB Glide average: 65 dB with max model Counting exercise average: 75 dB with max model  Side comments: mid 60s dB with usual cues   11/30/23: ST assessed pt's voice and motor speech. Pt presents with low/weakened voice ranging from 48-50 dB in conversation w/ usual cues for increased volume, and impaired intelligibility with imprecise articulation in conversational speech. ST introduced Speak Out! and provided pt education on the importance of speaking with intent, and resources to provide further knowledge on Parkinson's Disease, the Speak Out! program, and how it will be implemented in future sessions.   PATIENT EDUCATION: Education details: See above Person educated: Patient Education method: Explanation, Demonstration, and Handouts Education comprehension: verbalized understanding and needs further education  HOME EXERCISE PROGRAM: Speak Out! Lessons   GOALS: Goals reviewed with patient? Yes  SHORT TERM GOALS: Target date: 12/28/2023  Pt will complete daily HEP as recommended for 2/2 sessions. Baseline: Goal status: IN PROGRESS   2.  Pt will achieve targeted dB levels on SPEAK OUT! exercises with 80% accuracy given mod-A for 2/2 sessions.  Baseline:  Goal status: IN PROGRESS   3.  Pt will utilize dysarthria compensations during a 5 min conversation x2 to optimize vocal intensity and clarity given mod-A. Baseline:  Goal status: IN PROGRESS    LONG TERM GOALS: Target date: 01/25/2024  Pt will maintain WNL volume during a 10-15 minute conversation in 2/2 sessions with min-A. Baseline:  Goal status: IN PROGRESS   2.  Pt will subjectively report less frequent cues from family/friends to increase volume in  conversation across one week. Baseline:  Goal status: IN PROGRESS   3.   Pt will report improved communication effectiveness via PROM by 2 point improvement by last ST session.  Baseline:  Goal status: IN PROGRESS    ASSESSMENT:  CLINICAL IMPRESSION: Patient is a 73 y.o. 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 Conversational volume averaged low/mid 60s dB, which greatly impacted listener comprehension and resulted in need for repetition and clarification to understand pt. Was stimulable for increased volume and clarity with Speak Out! principles given max A. Continued implementing Speak Out! Program today completing lesson 1. Denied dysphagia or cognitive changes, although some short term recall challenges noted. Pt would benefit from skilled ST intervention to optimize communication effectiveness.   OBJECTIVE IMPAIRMENTS:  Objective impairments include dysarthria and voice disorder. These impairments are limiting patient from effectively communicating at home and in community. Factors affecting potential to achieve goals and functional outcome are co-morbidities. Patient will benefit from skilled SLP services to address above impairments and improve overall function.  REHAB POTENTIAL: Good  PLAN:  SLP FREQUENCY: 2x/week  SLP DURATION: 8 weeks  PLANNED INTERVENTIONS: Language facilitation, Environmental controls, Cueing hierachy, Internal/external aids, Oral motor exercises, Functional tasks, SLP instruction and feedback, Compensatory strategies, Patient/family education, and [id number] Treatment of speech (30 or 45 min)     [employer], Student-SLP 12/09/2023, 1:52 PM

## 2023-12-09 NOTE — Therapy (Signed)
 OUTPATIENT OCCUPATIONAL THERAPY PARKINSON'S TREATMENT  Patient Name: Edward Briggs MRN: [id number] DOB:08/01/1951, 73 y.o., male Today's Date: 12/09/2023  PCP: Levora Reyes SAUNDERS, MD REFERRING PROVIDER: Gregg Lek, MD   END OF SESSION:  OT End of Session - 12/09/23 1321     Visit Number 5    Number of Visits 16    Date for OT Re-Evaluation 01/21/24    Authorization Type [employer] Medicare    Progress Note Due on Visit 10    OT Start Time 1320    OT Stop Time 1400    OT Time Calculation (min) 40 min    Activity Tolerance Patient tolerated treatment well    Behavior During Therapy WFL for tasks assessed/performed             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    Past Surgical History:  Procedure Laterality Date   ABSCESS DRAINAGE     COLONOSCOPY     ~ 10 yrs ago   FRACTURE SURGERY     HARDWARE REMOVAL  03/26/2012   Procedure: HARDWARE REMOVAL;  Surgeon: Deward DELENA Schwartz, MD;  Location: [facility];  Service: Orthopedics;  Laterality: Left;  hardware removal deep left ankle syndesmotic screws only and stress xrays ankle    ORIF ANKLE FRACTURE  12/03/2011   Procedure: OPEN REDUCTION INTERNAL FIXATION (ORIF) ANKLE FRACTURE;  Surgeon: Deward DELENA Schwartz, MD;  Location: [facility];  Service: Orthopedics;  Laterality: Left;  left medial malleolus fracture   PILONIDAL CYST EXCISION  ~1982   SKIN GRAFT  ~1970   Lt great toe (graft from Lt thigh)   Patient Active Problem List   Diagnosis Date Noted   HTN (hypertension) 07/22/2019   Acute metabolic encephalopathy 07/22/2019   AKI (acute kidney injury) (HCC) 07/22/2019   Sepsis (HCC) 07/22/2019   UTI (urinary tract infection) 07/22/2019   Diabetes mellitus without complication (HCC) 09/10/2012   CHEST PAIN 07/21/2009    ONSET  DATE: 11/17/2023   REFERRING DIAG: G20.A1 (ICD-10-CM) - Parkinson's disease without dyskinesia, unspecified whether manifestations fluctuate (HCC)  Note: OT eval/treat for fine motor deficits from Parkinson Disease.   THERAPY DIAG:  Other lack of coordination  Muscle weakness (generalized)  Attention and concentration deficit  Other symptoms and signs involving the nervous system  Unsteadiness on feet  Rationale for Evaluation and Treatment: Rehabilitation  SUBJECTIVE:   SUBJECTIVE STATEMENT: Pt reports no new pain or falls  Pt accompanied by: significant other  PERTINENT HISTORY: PMH: HTN, Diabetes, HLD   PRECAUTIONS: Fall and Other: Cognitive decline  WEIGHT BEARING RESTRICTIONS: No  PAIN:  Are you having pain?  Chronic low back pain  FALLS: Has patient fallen in last 6 months? Yes. Number of falls 1  LIVING ENVIRONMENT: Lives with: lives with their spouse and grown son Lives in: House Stairs: Yes: Internal: 10 steps; on right going up and External: 4 steps; on right going up Has following equipment at home: None  PLOF:  mod assist dressing  PATIENT GOALS: improve handwriting  OBJECTIVE:  Note: Objective measures were completed at Evaluation unless otherwise noted.  HAND DOMINANCE: Right  ADLs: Eating: trouble getting food on utensil, bending down to eat Grooming: mod I  UB Dressing: mod assist LB Dressing: mod to max assist (  fluctuating assistance needed)  Toileting: mod I  Bathing: mod I  Tub Shower transfers: mod I - question safety  Equipment: none  IADLs: Shopping: wife mostly does, however pt does go with wife on occasion Light housekeeping: does laundry but wife folds clothes, pt takes trash out, occasionally vacuums Meal Prep: pt's son does most cooking however pt does breakfast Community mobility: pt does not drive Medication management: pt is doing w/ visual cues Financial management: wife does  Handwriting: Moderate micrographia,  difficulty forming the letters (apraxia?)   MOBILITY STATUS: Independent  POSTURE COMMENTS:  rounded shoulders (slightly)    FUNCTIONAL OUTCOME MEASURES: From screen on 11/17/23 Fastening/unfastening 3 buttons: = 47 sec.  Physical performance test: PPT#2 (simulated eating) = 29 sec PPT#4 (donning/doffing jacket): = 1:18 sec  COORDINATION: From screen on 11/17/23 9 Hole Peg test: Right: 64 sec; Left: 53 sec Box and Blocks:  Right 16 blocks, Left 27 blocks No tremors reported or observed, but pt later reports occasionally on Lt side  UE ROM:   Lt shoulder slightly stiffer, lacks full elbow extension on Lt and decreased supination bilaterally   SENSATION: Denies change    COGNITION: Overall cognitive status:  impaired spatial relations and insight into task completion, memory impairments , difficulty following commands,   OBSERVATIONS: Bradykinesia and Hypokinesia, 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 Practiced multiple times w/ less assist required. Wife instructed in method for greater carryover at home  Issued simple coordination HEP - see pt instructions for details. Pt requires cues t/o due to cognitive deficits. Wife present for education  UBE x 5 mintues, level 1 for UE strength/endurance w/ cues to go faster   PATIENT EDUCATION: Education details: reinforcing ADL strategies for jacket, coordination HEP  Person educated: Patient and Spouse Education method: Explanation, Demonstration, Tactile cues, Verbal cues, and Handouts Education comprehension: verbalized understanding, verbal cues required, tactile cues required, and needs further education  HOME EXERCISE  PROGRAM: 11/23/23: hip stretch, bag HEP 11/30/23: Big ADL strategies 12/09/23: coordination HEP   GOALS: Goals reviewed with patient? Yes  SHORT TERM GOALS: Target date: 12/24/23  Independent with PD specific HEP   Baseline: Goal status: MET w/ caregiver assist  2.  Pt will verbalize understanding of adaptive strategies to increase ease with ADLS/IADLS  (including: eating, dressing, writing) Baseline:  Goal status: MET w/ caregiver assist/cues  3.  Pt will copy first name with no significant decrease in size and maintain 90% legibility  Baseline:  Goal status: REVISED  4.  Pt will demonstrate increased ease with dressing as evidenced by decreasing PPT#4 (don/ doff jacket) to 60 secs or less  Baseline: 1:18 sec Goal status: IN PROGRESS   LONG TERM GOALS: Target date: 01/21/24  Pt to only require min assist for UE dressing and mod assist for LE dressing Baseline:  Goal status: IN PROGRESS  2.  Pt will verbalize understanding of ways to prevent future PD related complications and appropriate community resources prn  Baseline:  Goal status: INITIAL  3.  Pt/family will verbalize understanding of ways to maintain/enhance thinking skills as able and ways to compensate for STM changes Baseline:  Goal status: INITIAL  4.  Pt will demonstrate improved fine motor coordination for ADLs as evidenced by decreasing 9 hole peg test score for Rt hand by 5 secs or more Baseline: 64 sec Goal status: INITIAL  5.  Pt will demonstrate improved fine motor coordination for ADLs as evidenced by decreasing 9 hole peg test score for Lt hand by 3 secs  Baseline: 53 sec Goal status: INITIAL  6.  Improve RUE function as evidenced by increasing Box & Blocks by 4 blocks  Baseline: 29 blocks Goal status: INITIAL  ASSESSMENT:  CLINICAL IMPRESSION: Given patient's cognitive impairments and apraxia, pt does best with rote tasks, simple cueing (verbal) and demo cues. Focus needs to primarily be on ADLS  and safety. Pt would benefit from continued skilled O.T. to reinforce ADL strategies, caregiver education, and progress towards remaining goals as able.    PERFORMANCE DEFICITS: in functional skills including ADLs, IADLs, coordination, dexterity, ROM, strength, flexibility, Fine motor control, Gross motor control, mobility, body mechanics, endurance, decreased knowledge of precautions, decreased knowledge of use of DME, and UE functional use, cognitive skills including learn, memory, orientation, perception, problem solving, safety awareness, sequencing, thought, and understand,   IMPAIRMENTS: are limiting patient from ADLs, IADLs, and social participation.   COMORBIDITIES:  may have co-morbidities  that affects occupational performance. Patient will benefit from skilled OT to address above impairments and improve overall function.  REHAB POTENTIAL: Fair cognitive deficits  PLAN:  OT FREQUENCY: 2x/week  OT DURATION: 8 weeks (anticipate d/c after 6 weeks)   PLANNED INTERVENTIONS: 97535 self care/ADL training, 02889 therapeutic exercise, 97530 therapeutic activity, 97112 neuromuscular re-education, 97140 manual therapy, passive range of motion, functional mobility training, visual/perceptual remediation/compensation, patient/family education, and DME and/or AE instructions  RECOMMENDED OTHER SERVICES: none at this time  CONSULTED AND AGREED WITH PLAN OF CARE: Patient and family member/caregiver  PLAN FOR NEXT SESSION: review cape method for jacket and assess STG #4 and LTG #1, REVIEW coordination HEP prn, work on bilateral coordination and RUE functional use, caregiver education for pt to maintain BADL independence as able and safe light exercise   Burnard JINNY Roads, OT 12/09/2023, 1:21 PM

## 2023-12-09 NOTE — Patient Instructions (Addendum)
 Parkinson [employer] [facility] Website:  Watch the daily [employer] Home Practice Session Monday-Friday 11 AM You can watch pre-recorded sessions if you miss the daily lesson   Helpful Tips: Aim to be loud and strong. Challenge yourself to be even louder with each exercise Stair Step with pitch glides  Low pitch ahh  Mid pitch ahh High pitch ahh Read the sentence in your head first. Then say it aloud with INTENT! Family and friends should give you a sign that you need to be louder

## 2023-12-09 NOTE — Patient Instructions (Signed)
 Coordination Exercises   Perform the following exercises for 15-20 minutes 1 times per day. Perform with both hand(s). Perform using big movements.   Flipping Cards: Place deck of cards on the table. Flip cards over by opening your hand big to grasp and then turn your palm up big.  Pick up pennies from table and place in container: Pick up with big, intentional movements. Do not drag coin to the edge of table.   Pick up 5 coins one at a time and hold in palm. Then, move coins from palm to fingertips one at time and stack or line up.  Practice tracing or copying first name on lined or graphed paper  Perform Flicks/hand stretches (PWR! Hands): Close hands then flick out your fingers with focus on opening hands, pulling wrists back, and extending elbows like you are pushing.

## 2023-12-09 NOTE — Therapy (Signed)
 OUTPATIENT PHYSICAL THERAPY NEURO TREATMENT   Patient Name: Edward Briggs MRN: [id number] DOB:[birth year], 73 y.o., male Today's Date: 12/09/2023   PCP: Levora Reyes SAUNDERS, MD REFERRING PROVIDER: Gregg Lek, MD  END OF SESSION:  PT End of Session - 12/09/23 1405     Visit Number 4    Number of Visits 7    Date for PT Re-Evaluation 01/18/24    Authorization Type [employer] Medicare    Progress Note Due on Visit 9    PT Start Time 1404    PT Stop Time 1435    PT Time Calculation (min) 31 min    Equipment Utilized During Treatment Other (comment)   transport chair, mat level   Activity Tolerance Treatment limited secondary to medical complications (Comment)    Behavior During Therapy --   drowsy            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    Past Surgical History:  Procedure Laterality Date   ABSCESS DRAINAGE     COLONOSCOPY     ~ 10 yrs ago   FRACTURE SURGERY     HARDWARE REMOVAL  03/26/2012   Procedure: HARDWARE REMOVAL;  Surgeon: Deward DELENA Schwartz, MD;  Location: [facility];  Service: Orthopedics;  Laterality: Left;  hardware removal deep left ankle syndesmotic screws only and stress xrays ankle    ORIF ANKLE FRACTURE  12/03/2011   Procedure: OPEN REDUCTION INTERNAL FIXATION (ORIF) ANKLE FRACTURE;  Surgeon: Deward DELENA Schwartz, MD;  Location: [facility];  Service: Orthopedics;  Laterality: Left;  left medial malleolus fracture   PILONIDAL CYST EXCISION  ~1982   SKIN GRAFT  ~1970   Lt great toe (graft from Lt thigh)   Patient Active Problem List   Diagnosis Date Noted   HTN (hypertension) 07/22/2019   Acute metabolic encephalopathy 07/22/2019   AKI (acute kidney injury) (HCC) 07/22/2019   Sepsis (HCC) 07/22/2019   UTI (urinary tract infection) 07/22/2019   Diabetes  mellitus without complication (HCC) 09/10/2012   CHEST PAIN 07/21/2009    ONSET DATE: 11/17/2023 (referral)   REFERRING DIAG: G20.A1 (ICD-10-CM) - Parkinson's disease without dyskinesia, unspecified whether manifestations fluctuate (HCC)  THERAPY DIAG:  Muscle weakness (generalized)  Other abnormalities of gait and mobility  Unsteadiness on feet  History of falling  Rationale for Evaluation and Treatment: Rehabilitation  SUBJECTIVE:  SUBJECTIVE STATEMENT: Patient reports no changes. Patient denies falls and near falls. See below for updated subjective for spouse.  Pt accompanied by: significant other (wife, [name])   PERTINENT HISTORY: HTN, Diabetes, HLD  PAIN:  Are you having pain? No  PRECAUTIONS: Fall  RED FLAGS: None   WEIGHT BEARING RESTRICTIONS: No  FALLS: Has patient fallen in last 6 months? Yes. Number of falls 1  LIVING ENVIRONMENT: Lives with: lives with their spouse Lives in: House/apartment Stairs: Yes: Internal: full flight steps; on left going up and External: 4 steps; on left going up Has following equipment at home: None  PLOF: Needs assistance with ADLs (difficulty w/dressing)  PATIENT GOALS: Balance   OBJECTIVE:  Note: Objective measures were completed at Evaluation unless otherwise noted.  DIAGNOSTIC FINDINGS: DaT Scan from 04/09/23 FINDINGS: Decreased radiotracer activity in the posterior RIGHT putamen. Mild decreased activity in the head of the LEFT caudate nucleus compared to the RIGHT. Relatively normal shape of the LEFT striatum   IMPRESSION: Decreased radiotracer activity posterior RIGHT putamen is a pattern which could indicate Parkinsonian syndrome pathology.   Of note, DaTSCAN  is not diagnostic of Parkinsonian syndromes, which remains a  clinical diagnosis. DaTscan  is an adjuvant test to aid in the clinical diagnosis of Parkinsonian syndromes.  COGNITION: Overall cognitive status: Impaired   SENSATION: Pt denies numbness/tingling   COORDINATION: Decreased amplitude of movement w/finger flicks (LUE>RUE). No dysdiadokinesia   Heel to shin test: Decreased ROM bilaterally    POSTURE: rounded shoulders, forward head, and weight shift right  LOWER EXTREMITY ROM:     Active  Right Eval Left Eval  Hip flexion    Hip extension    Hip abduction    Hip adduction    Hip internal rotation    Hip external rotation    Knee flexion    Knee extension    Ankle dorsiflexion    Ankle plantarflexion    Ankle inversion    Ankle eversion     (Blank rows = not tested)  LOWER EXTREMITY MMT:  Tested in seated position   MMT Right Eval Left Eval  Hip flexion 4 4-  Hip extension    Hip abduction 4+ 4+  Hip adduction 4+ 4+  Hip internal rotation    Hip external rotation    Knee flexion 5 4-  Knee extension 5 4-  Ankle dorsiflexion 5 5  Ankle plantarflexion    Ankle inversion    Ankle eversion    (Blank rows = not tested)  BED MOBILITY:  Pt reports more difficulty to get into bed than out. Bed is tall and pt's back pain affects him    TRANSFERS: Assistive device utilized: None  Sit to stand: SBA Stand to sit: SBA Chair to chair: Min A (pt reaching for chair prior to fully turning to sit down, resulting in chair tipping and pt landing at very edge of seat)  TREATMENT DATE:   Self Care: Vitals:   12/09/23 1410 12/09/23 1411 12/09/23 1416 12/09/23 1422  BP: 105/65 (!) 99/57 121/73 (!) 99/54  Pulse: 82 85 79      Seated   Standing  Seated   Standing  Patient presenting with orthostatic hypotension in today's session. Spouse reports patient has not taken any medications this morning due  to rushing and they were planning on taking when got home. PT advised patient and spouse call PCP as well as check blood glucose as patient very drowsy and unsure what glucose reading is today and one of medications was for DM. Also advised telling PCP that patient has not taken medication to see what they would advise to ensure that no medications will lower BP further. Advised go to ED if patient becomes symptomatic. Educated on use of compression socks and advised follow up with PCP for BP management.   TherAct:  Assisted patient and spouse with stand pivot to transport chair and helped escort patient to car for car transfer in order to minimize BP drops.   PATIENT EDUCATION: Education details: See above Person educated: Patient and Spouse Education method: Explanation, Demonstration, Verbal cues, and Handouts Education comprehension: verbalized understanding, returned demonstration, verbal cues required, and needs further education  HOME EXERCISE PROGRAM: Access Code: [email or web address] URL: [email or web address] Date: 12/07/2023 Prepared by: Sheffield Senate  Exercises - Sit to Stand Without Arm Support  - 1 x daily - 7 x weekly - 2 sets - 10 reps - Standing Reach to Opposite Side with Weight Shift  - 1 x daily - 7 x weekly - 3 sets - 10 reps - Seated Windmill Trunk Rotation Stretch  - 1 x daily - 7 x weekly - 3 sets - 10 reps - Step sideways with arms reaching at counter   - 1 x daily - 7 x weekly - 2 sets - 10 reps   GOALS: Goals reviewed with patient? Yes  SHORT TERM GOALS: Target date: 12/21/2023   Pt will be independent with initial HEP for improved strength, balance, transfers and gait.  Baseline: started to review on eval  Goal status: INITIAL  2.  Pt will improve 5 x STS to less than or equal to 35 seconds w/proper body mechanics to demonstrate improved functional strength and transfer efficiency.   Baseline: 39.56s w/intermittent UE support and flexed posture   Goal status: INITIAL  3.  Pt will improve normal TUG to less than or equal to 14 seconds w/LRAD and CGA for improved functional mobility and decreased fall risk.  Baseline: 14.34s no AD and min A  Goal status: INITIAL  4.  Pt and wife will verbalize and demonstrate fall prevention techniques and freezing strategies to implement at home and in community for reduced fall frequency.  Baseline:  Goal status: INITIAL   LONG TERM GOALS: Target date: 01/04/2024   Pt will be independent with final HEP for improved strength, balance, transfers and gait.  Baseline:  Goal status: INITIAL  2.  Pt will improve 5 x STS to less than or equal to 28 seconds w/proper body mechanics to demonstrate improved functional strength and transfer efficiency.   Baseline: 39.56s w/intermittent UE support and flexed posture  Goal status: INITIAL  3.  Pt will improve normal TUG to less than or equal to 12 seconds w/LRAD and SBA for improved functional mobility and decreased fall risk.  Baseline: 14.34s no AD and min A  Goal status: INITIAL  4.  Pt will verbalize  understanding of local PD community resources, including fitness post DC.   Baseline:  Goal status: INITIAL   ASSESSMENT:  CLINICAL IMPRESSION: Skilled PT session limited by patient presenting with orthostatic hypotension in today's session as well as patient unsure of glucose readings and has not taken any medication today including DM medication. Advised medical follow up as noted above. Will continue per POC as able.     OBJECTIVE IMPAIRMENTS: Abnormal gait, decreased activity tolerance, decreased balance, decreased cognition, decreased knowledge of use of DME, decreased mobility, difficulty walking, decreased strength, decreased safety awareness, improper body mechanics, and pain.   ACTIVITY LIMITATIONS: carrying, lifting, transfers, bed mobility, and locomotion level  PARTICIPATION LIMITATIONS: cleaning, laundry, medication management,  interpersonal relationship, driving, shopping, community activity, and yard work  PERSONAL FACTORS: Fitness and 1 comorbidity: PD  are also affecting patient's functional outcome.   REHAB POTENTIAL: Fair due to PD prognosis  CLINICAL DECISION MAKING: Evolving/moderate complexity  EVALUATION COMPLEXITY: Moderate  PLAN:  PT FREQUENCY: 1x/week  PT DURATION: 6 weeks  PLANNED INTERVENTIONS: 97164- PT Re-evaluation, 97110-Therapeutic exercises, 97530- Therapeutic activity, [id number]- Neuromuscular re-education, 97535- Self Care, 02859- Manual therapy, [id number]- Gait training, [phone]- Aquatic Therapy, Patient/Family education, Balance training, Stair training, Dry Needling, and DME instructions  PLAN FOR NEXT SESSION: Review HEP and update as appropriate. Educate on freezing strategies and fall prevention. Increased step length, BOS and postural control   Keep practicing with RW (maybe trial Ustep) monitor for orthostatic hypotension and did patient follow up with PCP    Lauraine DELENA Grumbling, PT, DPT 12/09/2023, 3:57 PM

## 2023-12-10 ENCOUNTER — Telehealth: Payer: Self-pay

## 2023-12-10 NOTE — Telephone Encounter (Signed)
 Pt has been notified and states he is feeling better today.

## 2023-12-10 NOTE — Telephone Encounter (Signed)
 Hold on workouts and monitor blood pressures.  If any further low blood pressures needs to be seen acutely, or if any symptoms with these low blood pressures.  In office, urgent care or ER if needed.

## 2023-12-10 NOTE — Telephone Encounter (Signed)
 Copied from CRM [id number]. Topic: Clinical - Medical Advice >> Dec 09, 2023  3:53 PM Isabell A wrote: Reason for CRM: Spouse states patient went to occupation therapy today, his blood pressure readings while sitting was 105/65, 121/73 while standing it was 99/57, 99/54. Patient didn't complete any workouts today. Spouse would like to know if him not taking his medicine (Metformin , Lisinopril , carbidopa -levodopa  & atorvastatin ) before therapy caused this. He did complete speech & outpatient therapy, not physical.

## 2023-12-14 ENCOUNTER — Encounter: Payer: Self-pay | Admitting: Physical Therapy

## 2023-12-14 ENCOUNTER — Ambulatory Visit: Payer: Medicare Other | Admitting: Physical Therapy

## 2023-12-14 ENCOUNTER — Ambulatory Visit: Payer: Medicare Other | Admitting: Occupational Therapy

## 2023-12-14 ENCOUNTER — Ambulatory Visit: Payer: Medicare Other

## 2023-12-14 VITALS — BP 113/63 | HR 87

## 2023-12-14 DIAGNOSIS — R471 Dysarthria and anarthria: Secondary | ICD-10-CM

## 2023-12-14 DIAGNOSIS — Z9181 History of falling: Secondary | ICD-10-CM

## 2023-12-14 DIAGNOSIS — R2689 Other abnormalities of gait and mobility: Secondary | ICD-10-CM | POA: Diagnosis not present

## 2023-12-14 DIAGNOSIS — R2681 Unsteadiness on feet: Secondary | ICD-10-CM

## 2023-12-14 DIAGNOSIS — R41840 Attention and concentration deficit: Secondary | ICD-10-CM

## 2023-12-14 DIAGNOSIS — R41841 Cognitive communication deficit: Secondary | ICD-10-CM

## 2023-12-14 DIAGNOSIS — M6281 Muscle weakness (generalized): Secondary | ICD-10-CM

## 2023-12-14 DIAGNOSIS — R278 Other lack of coordination: Secondary | ICD-10-CM | POA: Diagnosis not present

## 2023-12-14 DIAGNOSIS — R293 Abnormal posture: Secondary | ICD-10-CM

## 2023-12-14 DIAGNOSIS — R29818 Other symptoms and signs involving the nervous system: Secondary | ICD-10-CM

## 2023-12-14 NOTE — Therapy (Signed)
 OUTPATIENT SPEECH LANGUAGE PATHOLOGY PARKINSON'S TREATMENT   Patient Name: Edward Briggs MRN: [id number] DOB:1951/06/22, 73 y.o., male Today's Date: 12/14/2023  PCP: Benjiman Bras, MD REFERRING PROVIDER: Benjiman Bras, MD  END OF SESSION:  End of Session - 12/14/23 1439     Visit Number 4    Number of Visits 17    Date for SLP Re-Evaluation 01/25/24    Authorization Type [employer] Medicare    SLP Start Time 1407    SLP Stop Time  1445    SLP Time Calculation (min) 38 min    Activity Tolerance Patient tolerated treatment well                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    Past Surgical History:  Procedure Laterality Date   ABSCESS DRAINAGE     COLONOSCOPY     ~ 10 yrs ago   FRACTURE SURGERY     HARDWARE REMOVAL  03/26/2012   Procedure: HARDWARE REMOVAL;  Surgeon: Janifer Meigs, MD;  Location: [facility];  Service: Orthopedics;  Laterality: Left;  hardware removal deep left ankle syndesmotic screws only and stress xrays ankle    ORIF ANKLE FRACTURE  12/03/2011   Procedure: OPEN REDUCTION INTERNAL FIXATION (ORIF) ANKLE FRACTURE;  Surgeon: Janifer Meigs, MD;  Location: [facility];  Service: Orthopedics;  Laterality: Left;  left medial malleolus fracture   PILONIDAL CYST EXCISION  ~1982   SKIN GRAFT  ~1970   Lt great toe (graft from Lt thigh)   Patient Active Problem List   Diagnosis Date Noted   HTN (hypertension) 07/22/2019   Acute metabolic encephalopathy 07/22/2019   AKI (acute kidney injury) (HCC) 07/22/2019   Sepsis (HCC) 07/22/2019   UTI (urinary tract infection) 07/22/2019   Diabetes mellitus without complication (HCC) 09/10/2012   CHEST PAIN 07/21/2009    ONSET DATE: 11/17/2023  REFERRING DIAG: Parkinson's  THERAPY DIAG: Dysarthria and  anarthria (R47.1) and Cognitive communication deficit (R41.841)   Rationale for Evaluation and Treatment: Rehabilitation  SUBJECTIVE:   SUBJECTIVE STATEMENT: Pt presents with slightly higher conversational volume initially than in prior sessions. Pt accompanied by: significant other, [name]   PERTINENT HISTORY: PMH: HTN, Diabetes, HLD   PAIN: Are you having pain? Yes, 6-7/10 Location: lower back Description: aching  FALLS: Has patient fallen in last 6 months? No  LIVING ENVIRONMENT: Lives with: lives with their family Lives in: House/apartment  PLOF:  Level of assistance: Needed assistance with ADLs, Needed assistance with IADLS Employment: Retired  PATIENT GOALS: increase communication effectiveness   OBJECTIVE:  Note: Objective measures were completed at Evaluation unless otherwise noted.  TREATMENT DATE:   12/14/23: ST targeted improving vocal intensity and quality using Speak Out! Program and increasing intensity through progressively difficult speech tasks. Pt shared completing lesson 2 at home. ST used Lesson 3 to continue practice with additional exercises not yet introduced in prior sessions. ST lead pt through exercises providing frequent model prior to pt execution. Reduced cuing required to achieve target dB with warm up exercises, however max-A needed for Counting and Reading tasks d/t cognitive limitations.  Sustained "ah": 88 dB Glide average: 78 dB with max model and verbal cues Counting exercise average: 70 dB with max model, concise explanation of expectations, and mod cues  Reading Exercise average: 62 dB, 68 dB with max model  Side comments: low/mid 60's dB with consistent cues  12/09/23: ST reviewed Speak Out! Program and principles. Targeted improving vocal quality and increasing intensity through progressively difficulty speech tasks using  Speak Out! Program, lesson 1 to initiate practice with exercises not yet introduced in prior session. ST lead pt through each exercise providing frequent model prior to pt execution. Usual max-A was required to achieve target dB this date d/t cognitive limitations. Averages this date:  Warm up average: 83 dB Sustained "ah": 82 dB Glide average: 78 dB with max model and verbal cues Counting exercise average: 67 dB with model, concise explanation of expectations, and fewer cues Reading Exercise average: 65 dB, 70 dB with max model Cognitive Exercise average: 62 dB, 69 dB with max model  Side comments: low/mid 60's dB with consistent cues  12/07/23: ST reviewed Speak Out! Program and principles. Targeted improving vocal quality and increasing intensity through progressively difficulty speech tasks using Speak Out! program, lesson 1. ST leads pt through exercises providing usual model prior to pt execution. usual max-A required to achieve target dB this date d/t cognitive limitations. Averages this date:  Warm up average: 78 dB Sustained "ah" 83 dB Glide average: 65 dB with max model Counting exercise average: 75 dB with max model  Side comments: mid 60s dB with usual cues   11/30/23: ST assessed pt's voice and motor speech. Pt presents with low/weakened voice ranging from 48-50 dB in conversation w/ usual cues for increased volume, and impaired intelligibility with imprecise articulation in conversational speech. ST introduced Speak Out! and provided pt education on the importance of speaking with intent, and resources to provide further knowledge on Parkinson's Disease, the Speak Out! program, and how it will be implemented in future sessions.   PATIENT EDUCATION: Education details: See above Person educated: Patient Education method: Explanation, Demonstration, and Handouts Education comprehension: verbalized understanding and needs further education  HOME EXERCISE PROGRAM: Speak Out!  Lessons   GOALS: Goals reviewed with patient? Yes  SHORT TERM GOALS: Target date: 12/28/2023  Pt will complete daily HEP as recommended for 2/2 sessions. Baseline: Goal status: IN PROGRESS   2.  Pt will achieve targeted dB levels on SPEAK OUT! exercises with 80% accuracy given mod-A for 2/2 sessions.  Baseline:  Goal status: IN PROGRESS   3.  Pt will utilize dysarthria compensations during a 5 min conversation x2 to optimize vocal intensity and clarity given mod-A. Baseline:  Goal status: IN PROGRESS    LONG TERM GOALS: Target date: 01/25/2024  Pt will maintain WNL volume during a 10-15 minute conversation in 2/2 sessions with min-A. Baseline:  Goal status: IN PROGRESS   2.  Pt will subjectively report less frequent cues from family/friends to increase volume in conversation across one week. Baseline:  Goal  status: IN PROGRESS   3.   Pt will report improved communication effectiveness via PROM by 2 point improvement by last ST session.  Baseline:  Goal status: IN PROGRESS    ASSESSMENT:  CLINICAL IMPRESSION: Patient is a 73 y.o. 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 Conversational volume averaged low/mid 60s dB, which greatly impacted listener comprehension and resulted in need for repetition and clarification to understand pt. Was stimulable for increased volume and clarity with Speak Out! principles given max A. Continued implementing Speak Out! Program today completing lesson 1. Denied dysphagia or cognitive changes, although some short term recall challenges noted. Pt would benefit from skilled ST intervention to optimize communication effectiveness.   OBJECTIVE IMPAIRMENTS: Objective impairments include dysarthria and voice disorder. These impairments are limiting patient from effectively communicating at home and in community. Factors affecting potential to achieve goals and functional outcome are co-morbidities.  Patient will benefit from skilled SLP services to address above impairments and improve overall function.  REHAB POTENTIAL: Good  PLAN:  SLP FREQUENCY: 2x/week  SLP DURATION: 8 weeks  PLANNED INTERVENTIONS: Language facilitation, Environmental controls, Cueing hierachy, Internal/external aids, Oral motor exercises, Functional tasks, SLP instruction and feedback, Compensatory strategies, Patient/family education, and [id number] Treatment of speech (30 or 45 min)     [employer], Student-SLP 12/14/2023, 3:12 PM     [facility] [address] [place], [place], [address] Phone: [phone]   Fax:  [phone]  Patient Details  Name: CHAYTON ACERO MRN: [id number] Date of Birth: 1951-01-29 Referring Provider:  Cassandra Cleveland, MD  Encounter Date: 12/14/2023   Neva Barban, Student-SLP 12/14/2023, 3:12 PM  [facility] [address] [place], [place], [address] Phone: [phone]   Fax:  [phone]

## 2023-12-14 NOTE — Therapy (Signed)
 OUTPATIENT OCCUPATIONAL THERAPY PARKINSON'S TREATMENT  Patient Name: Edward Briggs MRN: [id number] DOB:January 15, 1951, 73 y.o., male Today's Date: 12/14/2023  PCP: Benjiman Bras, MD REFERRING PROVIDER: Cassandra Cleveland, MD   END OF SESSION:  OT End of Session - 12/14/23 1314     Visit Number 6    Number of Visits 16    Date for OT Re-Evaluation 01/21/24    Authorization Type [employer] Medicare    Progress Note Due on Visit 10    OT Start Time 1315    OT Stop Time 1400    OT Time Calculation (min) 45 min    Activity Tolerance Patient tolerated treatment well    Behavior During Therapy WFL for tasks assessed/performed             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    Past Surgical History:  Procedure Laterality Date   ABSCESS DRAINAGE     COLONOSCOPY     ~ 10 yrs ago   FRACTURE SURGERY     HARDWARE REMOVAL  03/26/2012   Procedure: HARDWARE REMOVAL;  Surgeon: Janifer Meigs, MD;  Location: [facility];  Service: Orthopedics;  Laterality: Left;  hardware removal deep left ankle syndesmotic screws only and stress xrays ankle    ORIF ANKLE FRACTURE  12/03/2011   Procedure: OPEN REDUCTION INTERNAL FIXATION (ORIF) ANKLE FRACTURE;  Surgeon: Janifer Meigs, MD;  Location: [facility];  Service: Orthopedics;  Laterality: Left;  left medial malleolus fracture   PILONIDAL CYST EXCISION  ~1982   SKIN GRAFT  ~1970   Lt great toe (graft from Lt thigh)   Patient Active Problem List   Diagnosis Date Noted   HTN (hypertension) 07/22/2019   Acute metabolic encephalopathy 07/22/2019   AKI (acute kidney injury) (HCC) 07/22/2019   Sepsis (HCC) 07/22/2019   UTI (urinary tract infection) 07/22/2019   Diabetes mellitus without complication (HCC) 09/10/2012   CHEST PAIN 07/21/2009    ONSET  DATE: 11/17/2023   REFERRING DIAG: G20.A1 (ICD-10-CM) - Parkinson's disease without dyskinesia, unspecified whether manifestations fluctuate (HCC)  Note: OT eval/treat for fine motor deficits from Parkinson Disease.   THERAPY DIAG:  Muscle weakness (generalized)  Unsteadiness on feet  Other lack of coordination  Attention and concentration deficit  Other symptoms and signs involving the nervous system  Abnormal posture  Rationale for Evaluation and Treatment: Rehabilitation  SUBJECTIVE:   SUBJECTIVE STATEMENT: Pt reports no new pain or falls  Pt accompanied by: significant other  PERTINENT HISTORY: PMH: HTN, Diabetes, HLD   PRECAUTIONS: Fall and Other: Cognitive decline  WEIGHT BEARING RESTRICTIONS: No  PAIN:  Are you having pain?  Chronic low back pain  FALLS: Has patient fallen in last 6 months? Yes. Number of falls 1  LIVING ENVIRONMENT: Lives with: lives with their spouse and grown son Lives in: House Stairs: Yes: Internal: 10 steps; on right going up and External: 4 steps; on right going up Has following equipment at home: None  PLOF:  mod assist dressing  PATIENT GOALS: improve handwriting  OBJECTIVE:  Note: Objective measures were completed at Evaluation unless otherwise noted.  HAND DOMINANCE: Right  ADLs: Eating: trouble getting food on utensil, bending down to eat Grooming: mod I  UB Dressing: mod assist LB Dressing: mod  to max assist (fluctuating assistance needed)  Toileting: mod I  Bathing: mod I  Tub Shower transfers: mod I - question safety  Equipment: none  IADLs: Shopping: wife mostly does, however pt does go with wife on occasion Light housekeeping: does laundry but wife folds clothes, pt takes trash out, occasionally vacuums Meal Prep: pt's son does most cooking however pt does breakfast Community mobility: pt does not drive Medication management: pt is doing w/ visual cues Financial management: wife does  Handwriting:  Moderate micrographia, difficulty forming the letters (apraxia?)   MOBILITY STATUS: Independent  POSTURE COMMENTS:  rounded shoulders (slightly)    FUNCTIONAL OUTCOME MEASURES: From screen on 11/17/23 Fastening/unfastening 3 buttons: = 47 sec.  Physical performance test: PPT#2 (simulated eating) = 29 sec PPT#4 (donning/doffing jacket): = 1:18 sec  COORDINATION: From screen on 11/17/23 9 Hole Peg test: Right: 64 sec; Left: 53 sec Box and Blocks:  Right 16 blocks, Left 27 blocks No tremors reported or observed, but pt later reports occasionally on Lt side  UE ROM:   Lt shoulder slightly stiffer, lacks full elbow extension on Lt and decreased supination bilaterally   SENSATION: Denies change    COGNITION: Overall cognitive status:  impaired spatial relations and insight into task completion, memory impairments , difficulty following commands,   OBSERVATIONS: Bradykinesia and Hypokinesia, 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 Practiced 3 times w/ less assist required.  PPT #4 = 38 sec. With min assist, max cues  Practiced donning/doffing pull over short sleeve shirt w/ max simple v.c's and hand over hand assist needed - overall mod assist, max cues d/t severity of cognitive deficits Doffing with min assist max cues to doff over head  Standing; Working on BUE reaching into high cabinet and big side steps to place items to side then back to shelf for reaching/ROM, balance, trunk rotation, and coordination  Seated: placing medium sized pegs in pegboard Rt hand for coordination, following initial peg color placed for each row to follow simple pattern. Then removing pegs with Lt  hand.    PATIENT EDUCATION: Education details: reinforcing ADL strategies for jacket, coordination HEP  Person educated: Patient and Spouse Education method: Explanation, Demonstration, Tactile cues, Verbal cues, and Handouts Education comprehension: verbalized understanding, verbal cues required, tactile cues required, and needs further education  HOME EXERCISE PROGRAM: 11/23/23: hip stretch, bag HEP 11/30/23: Big ADL strategies 12/09/23: coordination HEP   GOALS: Goals reviewed with patient? Yes  SHORT TERM GOALS: Target date: 12/24/23  Independent with PD specific HEP   Baseline: Goal status: MET w/ caregiver assist  2.  Pt will verbalize understanding of adaptive strategies  to increase ease with ADLS/IADLS  (including: eating, dressing, writing) Baseline:  Goal status: MET w/ caregiver assist/cues  3.  Pt will copy first name with no significant decrease in size and maintain 90% legibility  Baseline:  Goal status: REVISED  4.  Pt will demonstrate increased ease with dressing as evidenced by decreasing PPT#4 (don/ doff jacket) to 60 secs or less  Baseline: 1:18 sec Goal status: MET w/ min physical assist, max simple verbal cues/demo cues (12/14/23: 38 sec)    LONG TERM GOALS: Target date: 01/21/24  Pt to only require min assist for UE dressing and mod assist for LE dressing Baseline:  Goal status: PARTIALLY MET (Mod assist for UB, min assist for LB)  2.  Pt will verbalize understanding of ways to prevent future PD related complications and appropriate community resources prn  Baseline:  Goal status: INITIAL  3.  Pt/family will verbalize understanding of ways to maintain/enhance thinking skills as able and ways to compensate for STM changes Baseline:  Goal status: INITIAL  4.  Pt will demonstrate improved fine motor coordination for ADLs as evidenced by decreasing 9 hole peg test score for Rt hand by 5 secs or more Baseline: 64 sec Goal status: INITIAL  5.  Pt will  demonstrate improved fine motor coordination for ADLs as evidenced by decreasing 9 hole peg test score for Lt hand by 3 secs  Baseline: 53 sec Goal status: INITIAL  6.  Improve RUE function as evidenced by increasing Box & Blocks by 4 blocks  Baseline: 29 blocks Goal status: INITIAL  ASSESSMENT:  CLINICAL IMPRESSION: Given patient's cognitive impairments and apraxia, pt does best with rote tasks, simple cueing (verbal) and demo cues. Focus needs to primarily be on ADLS and safety. Pt would benefit from continued skilled O.T. to reinforce ADL strategies, caregiver education, and progress towards remaining goals as able.    PERFORMANCE DEFICITS: in functional skills including ADLs, IADLs, coordination, dexterity, ROM, strength, flexibility, Fine motor control, Gross motor control, mobility, body mechanics, endurance, decreased knowledge of precautions, decreased knowledge of use of DME, and UE functional use, cognitive skills including learn, memory, orientation, perception, problem solving, safety awareness, sequencing, thought, and understand,   IMPAIRMENTS: are limiting patient from ADLs, IADLs, and social participation.   COMORBIDITIES:  may have co-morbidities  that affects occupational performance. Patient will benefit from skilled OT to address above impairments and improve overall function.  REHAB POTENTIAL: Fair cognitive deficits  PLAN:  OT FREQUENCY: 2x/week  OT DURATION: 8 weeks (anticipate d/c after 6 weeks)   PLANNED INTERVENTIONS: 97535 self care/ADL training, 57846 therapeutic exercise, 97530 therapeutic activity, 97112 neuromuscular re-education, 97140 manual therapy, passive range of motion, functional mobility training, visual/perceptual remediation/compensation, patient/family education, and DME and/or AE instructions  RECOMMENDED OTHER SERVICES: none at this time  CONSULTED AND AGREED WITH PLAN OF CARE: Patient and family member/caregiver  PLAN FOR NEXT SESSION:  REVIEW coordination HEP prn, continue to work on bilateral coordination and RUE functional use, continue caregiver education for pt to maintain BADL independence as able and safe light exercise, progress towards remaining goals   Velinda Getting, OT 12/14/2023, 1:15 PM

## 2023-12-14 NOTE — Therapy (Signed)
OUTPATIENT PHYSICAL THERAPY NEURO TREATMENT/RE-CERT   Patient Name: Edward Briggs MRN: [id number] DOB:October 26, 1951, 73 y.o., male Today's Date: 12/15/2023   PCP: [name], MD REFERRING PROVIDER: Windell Norfolk, MD  END OF SESSION:  PT End of Session - 12/14/23 1232     Visit Number 5    Number of Visits 13    Date for PT Re-Evaluation 01/18/24    Authorization Type [employer] Medicare    Progress Note Due on Visit 9    PT Start Time 1230    PT Stop Time 1312    PT Time Calculation (min) 42 min    Equipment Utilized During Treatment Gait belt    Activity Tolerance Patient tolerated treatment well    Behavior During Therapy WFL for tasks assessed/performed;Flat affect             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   Past Surgical History:  Procedure Laterality Date   ABSCESS DRAINAGE     COLONOSCOPY     ~ 10 yrs ago   FRACTURE SURGERY     HARDWARE REMOVAL  03/26/2012   Procedure: HARDWARE REMOVAL;  Surgeon: Sherri Rad, MD;  Location: [facility];  Service: Orthopedics;  Laterality: Left;  hardware removal deep left ankle syndesmotic screws only and stress xrays ankle    ORIF ANKLE FRACTURE  12/03/2011   Procedure: OPEN REDUCTION INTERNAL FIXATION (ORIF) ANKLE FRACTURE;  Surgeon: Sherri Rad, MD;  Location: [facility];  Service: Orthopedics;  Laterality: Left;  left medial malleolus fracture   PILONIDAL CYST EXCISION  ~1982   SKIN GRAFT  ~1970   Lt great toe (graft from Lt thigh)   Patient Active Problem List   Diagnosis Date Noted   HTN (hypertension) 07/22/2019   Acute metabolic encephalopathy 07/22/2019   AKI (acute kidney injury) (HCC) 07/22/2019   Sepsis (HCC) 07/22/2019   UTI (urinary tract infection) 07/22/2019   Diabetes mellitus without  complication (HCC) 09/10/2012   CHEST PAIN 07/21/2009    ONSET DATE: 11/17/2023 (referral)   REFERRING DIAG: G20.A1 (ICD-10-CM) - Parkinson's disease without dyskinesia, unspecified whether manifestations fluctuate (HCC)  THERAPY DIAG:  Muscle weakness (generalized)  Other abnormalities of gait and mobility  Unsteadiness on feet  History of falling  Rationale for Evaluation and Treatment: Rehabilitation  SUBJECTIVE:  SUBJECTIVE STATEMENT: Pt reports the BP values are doing ok. Did not feel lightheaded. Will see his PCP on the 13th. Reports that he took his BP medication prior to therapy today. No falls. Exercises are going well.   Pt accompanied by: significant other (wife, [name])   PERTINENT HISTORY: HTN, Diabetes, HLD  PAIN:  Are you having pain? No  PRECAUTIONS: Fall  RED FLAGS: None   WEIGHT BEARING RESTRICTIONS: No  FALLS: Has patient fallen in last 6 months? Yes. Number of falls 1  LIVING ENVIRONMENT: Lives with: lives with their spouse Lives in: House/apartment Stairs: Yes: Internal: full flight steps; on left going up and External: 4 steps; on left going up Has following equipment at home: None  PLOF: Needs assistance with ADLs (difficulty w/dressing)  PATIENT GOALS: "Balance"   OBJECTIVE:  Note: Objective measures were completed at Evaluation unless otherwise noted.  DIAGNOSTIC FINDINGS: DaT Scan from 04/09/23 FINDINGS: Decreased radiotracer activity in the posterior RIGHT putamen. Mild decreased activity in the head of the LEFT caudate nucleus compared to the RIGHT. Relatively normal shape of the LEFT striatum   IMPRESSION: Decreased radiotracer activity posterior RIGHT putamen is a pattern which could indicate Parkinsonian syndrome pathology.   Of note, DaTSCAN  is not diagnostic of Parkinsonian syndromes, which remains a clinical diagnosis. DaTscan is an adjuvant test to aid in the clinical diagnosis of Parkinsonian syndromes.  COGNITION: Overall cognitive status: Impaired   SENSATION: Pt denies numbness/tingling   COORDINATION: Decreased amplitude of movement w/finger flicks (LUE>RUE). No dysdiadokinesia   Heel to shin test: Decreased ROM bilaterally    POSTURE: rounded shoulders, forward head, and weight shift right  LOWER EXTREMITY ROM:     Active  Right Eval Left Eval  Hip flexion    Hip extension    Hip abduction    Hip adduction    Hip internal rotation    Hip external rotation    Knee flexion    Knee extension    Ankle dorsiflexion    Ankle plantarflexion    Ankle inversion    Ankle eversion     (Blank rows = not tested)  LOWER EXTREMITY MMT:  Tested in seated position   MMT Right Eval Left Eval  Hip flexion 4 4-  Hip extension    Hip abduction 4+ 4+  Hip adduction 4+ 4+  Hip internal rotation    Hip external rotation    Knee flexion 5 4-  Knee extension 5 4-  Ankle dorsiflexion 5 5  Ankle plantarflexion    Ankle inversion    Ankle eversion    (Blank rows = not tested)  BED MOBILITY:  Pt reports more difficulty to get into bed than out. Bed is tall and pt's back pain affects him    TRANSFERS: Assistive device utilized: None  Sit to stand: SBA Stand to sit: SBA Chair to chair: Min A (pt reaching for chair prior to fully turning to sit down, resulting in chair tipping and pt landing at very edge of seat)  TREATMENT DATE:   Therapeutic Activity: Vitals:   12/14/23 1236 12/14/23 1238  BP: 119/70 113/63  Pulse: 80 87  Sitting, Standing  Pt reports no lightheadedness in standing and took his medications today.   U-Step Rollator education and process of obtaining one through  Medicare. Pt and pt's spouse in agreement to try to get order for U-Step and seeing how much Medicare would cover. PT to work on sending referral request to Dr. Teresa Coombs  Cues throughout session to turn all the way around to mat table before sitting down and reaching back. Pt needing max cues to reach back and stay reaching back. With pt having tendency to reach back and then go back to grabbing the walker instead of the mat to lower himself down.   15 reps sit <> stands with 6# medicine ball and slamming to floor and catching it, pt reporting RPE as 7/10, initial cues for larger amplitude  With 4 blaze pods on 2 steps (2 on first step, 2 on 2nd step), working on SLS stability, weight shifting, visual scanning, foot clearance. Performed on random hit setting, CGA/min A for balance, pt trying to performing without UE support   Performed 2 bouts of 1 minute: 20 hits, 20 hits Cues to set foot down on floor before lifting it back up to step   GAIT TRAINING: Gait pattern: step to pattern, decreased arm swing- Right, decreased arm swing- Left, decreased stride length, decreased ankle dorsiflexion- Right, decreased ankle dorsiflexion- Left, shuffling, festinating, lateral hip instability, decreased trunk rotation, trunk flexed, narrow BOS, poor foot clearance- Right, and poor foot clearance- Left Distance walked: 230' x 1 with U-Step Rollator  Assistive device utilized: None, U-Step Rollator  Level of assistance: CGA, supervision  Comments: With small distances with no AD during session, cues for posture, arm swing and step length and CGA. Pt with incr forward flexed posture and no bilateral arm swing.   Trialed U-Step Rollator to work on decr fall risk. Pt needing cues throughout to stay closer to U-Step as pt still farther away from U-Step despite consistent cues. Pt also needing cues for foot clearance/stride length, pt still with decr stride length and step height despite cues. However, pt was steady with  U-Step, especially when going around turns. Discussed that this could be a better option for pt compared to not using any AD at all.   With 5 cones with pt having to weave in and out, working on sharper turns, performed down and back x2 reps, pt needing cues to not bump into cones, but did do better with turns with U-Step compared to RW     PATIENT EDUCATION: Education details: See therapeutic activity section above.  Person educated: Patient and Spouse Education method: Explanation, Demonstration, Verbal cues, and Handouts Education comprehension: verbalized understanding, returned demonstration, verbal cues required, and needs further education  HOME EXERCISE PROGRAM: Access Code: [email or web address] URL: [email or web address] Date: 12/07/2023 Prepared by: Sherlie Ban  Exercises - Sit to Stand Without Arm Support  - 1 x daily - 7 x weekly - 2 sets - 10 reps - Standing Reach to Opposite Side with Weight Shift  - 1 x daily - 7 x weekly - 3 sets - 10 reps - Seated Windmill Trunk Rotation Stretch  - 1 x daily - 7 x weekly - 3 sets - 10 reps - Step sideways with arms reaching at counter   - 1 x daily - 7 x weekly - 2 sets - 10 reps  GOALS: Goals reviewed with patient? Yes  SHORT TERM GOALS: Target date: 12/21/2023   Pt will be independent with initial HEP for improved strength, balance, transfers and gait.  Baseline: started to review on eval  Goal status: INITIAL  2.  Pt will improve 5 x STS to less than or equal to 35 seconds w/proper body mechanics to demonstrate improved functional strength and transfer efficiency.   Baseline: 39.56s w/intermittent UE support and flexed posture  Goal status: INITIAL  3.  Pt will improve normal TUG to less than or equal to 14 seconds w/LRAD and CGA for improved functional mobility and decreased fall risk.  Baseline: 14.34s no AD and min A  Goal status: INITIAL  4.  Pt and wife will verbalize and demonstrate fall prevention  techniques and freezing strategies to implement at home and in community for reduced fall frequency.  Baseline:  Goal status: INITIAL   LONG TERM GOALS: Target date: 01/04/2024   Pt will be independent with final HEP for improved strength, balance, transfers and gait.  Baseline:  Goal status: INITIAL  2.  Pt will improve 5 x STS to less than or equal to 28 seconds w/proper body mechanics to demonstrate improved functional strength and transfer efficiency.   Baseline: 39.56s w/intermittent UE support and flexed posture  Goal status: INITIAL  3.  Pt will improve normal TUG to less than or equal to 12 seconds w/LRAD and SBA for improved functional mobility and decreased fall risk.  Baseline: 14.34s no AD and min A  Goal status: INITIAL  4.  Pt will verbalize understanding of local PD community resources, including fitness post DC.   Baseline:  Goal status: INITIAL   ASSESSMENT:  CLINICAL IMPRESSION: Pt's BP WNL during session today and did not have orthostatics compared to previous session. Pt took all his medications before therapy today. Trialed use of U-Step Rollator today to decr fall risk. Pt did demo improvement in turning ability with U-Step compared to RW, but did still need cues throughout to stay closer to AD and for incr stride length. Pt did not respond well to cues due to cognition. Still seems like this would be a better AD to help decr fall risk compared to no AD. PT to work on obtaining an order. Re-cert also sent to include frequency to 2x a week instead of 1x a week. Will continue per POC as able.     OBJECTIVE IMPAIRMENTS: Abnormal gait, decreased activity tolerance, decreased balance, decreased cognition, decreased knowledge of use of DME, decreased mobility, difficulty walking, decreased strength, decreased safety awareness, improper body mechanics, and pain.   ACTIVITY LIMITATIONS: carrying, lifting, transfers, bed mobility, and locomotion level  PARTICIPATION  LIMITATIONS: cleaning, laundry, medication management, interpersonal relationship, driving, shopping, community activity, and yard work  PERSONAL FACTORS: Fitness and 1 comorbidity: PD  are also affecting patient's functional outcome.   REHAB POTENTIAL: Fair due to PD prognosis  CLINICAL DECISION MAKING: Evolving/moderate complexity  EVALUATION COMPLEXITY: Moderate  PLAN:  PT FREQUENCY: 2x/week  PT DURATION: 6 weeks  PLANNED INTERVENTIONS: 97164- PT Re-evaluation, 97110-Therapeutic exercises, 97530- Therapeutic activity, [id number]- Neuromuscular re-education, 97535- Self Care, 08657- Manual therapy, [id number]- Gait training, [phone]- Aquatic Therapy, Patient/Family education, Balance training, Stair training, Dry Needling, and DME instructions  PLAN FOR NEXT SESSION: Review HEP and update as appropriate. Educate on freezing strategies and fall prevention. Increased step length, BOS and postural control   Keep practicing with RW (maybe trial Ustep) monitor for orthostatic hypotension and did patient follow  up with PCP    Drake Leach, PT, DPT 12/15/2023, 1:02 PM

## 2023-12-16 ENCOUNTER — Encounter: Payer: Medicare Other | Admitting: Occupational Therapy

## 2023-12-16 ENCOUNTER — Telehealth: Payer: Self-pay | Admitting: Physical Therapy

## 2023-12-16 ENCOUNTER — Other Ambulatory Visit: Payer: Self-pay | Admitting: Neurology

## 2023-12-16 ENCOUNTER — Ambulatory Visit: Payer: Medicare Other | Admitting: Physical Therapy

## 2023-12-16 ENCOUNTER — Ambulatory Visit: Payer: Medicare Other | Admitting: Speech Pathology

## 2023-12-16 DIAGNOSIS — G20A1 Parkinson's disease without dyskinesia, without mention of fluctuations: Secondary | ICD-10-CM

## 2023-12-16 NOTE — Telephone Encounter (Signed)
Order is in. Thanks.

## 2023-12-16 NOTE — Telephone Encounter (Signed)
Dr. Teresa Coombs, Edward Briggs has been seen by PT for Parkinson's Disease.  The patient would benefit from an order for a U-Step Rollator to decr fall risk and to help with freezing episodes.   If you agree, please place an order in [facility] in [facility] or fax the order to [phone]. Thank you, Sherlie Ban, PT, DPT 12/16/23 1:59 PM    [facility] [address] [place], [place]  [address] Phone:  [phone] Fax:  [phone]

## 2023-12-17 ENCOUNTER — Encounter (HOSPITAL_COMMUNITY): Payer: Self-pay | Admitting: Internal Medicine

## 2023-12-17 ENCOUNTER — Inpatient Hospital Stay (HOSPITAL_COMMUNITY)
Admission: EM | Admit: 2023-12-17 | Discharge: 2023-12-24 | DRG: 193 | Disposition: A | Discharge status: Skilled Nursing Facility | Payer: Medicare Other | Attending: Family Medicine | Admitting: Family Medicine

## 2023-12-17 ENCOUNTER — Emergency Department (HOSPITAL_COMMUNITY): Payer: Medicare Other

## 2023-12-17 ENCOUNTER — Other Ambulatory Visit: Payer: Self-pay

## 2023-12-17 ENCOUNTER — Ambulatory Visit: Payer: Medicare Other | Admitting: Family Medicine

## 2023-12-17 ENCOUNTER — Other Ambulatory Visit: Payer: Self-pay | Admitting: Neurology

## 2023-12-17 DIAGNOSIS — I10 Essential (primary) hypertension: Secondary | ICD-10-CM | POA: Diagnosis not present

## 2023-12-17 DIAGNOSIS — D638 Anemia in other chronic diseases classified elsewhere: Secondary | ICD-10-CM | POA: Diagnosis present

## 2023-12-17 DIAGNOSIS — Z87891 Personal history of nicotine dependence: Secondary | ICD-10-CM | POA: Diagnosis not present

## 2023-12-17 DIAGNOSIS — G9341 Metabolic encephalopathy: Secondary | ICD-10-CM | POA: Diagnosis not present

## 2023-12-17 DIAGNOSIS — Z1152 Encounter for screening for COVID-19: Secondary | ICD-10-CM | POA: Diagnosis not present

## 2023-12-17 DIAGNOSIS — G8929 Other chronic pain: Secondary | ICD-10-CM | POA: Diagnosis present

## 2023-12-17 DIAGNOSIS — K219 Gastro-esophageal reflux disease without esophagitis: Secondary | ICD-10-CM | POA: Diagnosis not present

## 2023-12-17 DIAGNOSIS — E785 Hyperlipidemia, unspecified: Secondary | ICD-10-CM | POA: Diagnosis not present

## 2023-12-17 DIAGNOSIS — R531 Weakness: Secondary | ICD-10-CM | POA: Diagnosis not present

## 2023-12-17 DIAGNOSIS — Z7984 Long term (current) use of oral hypoglycemic drugs: Secondary | ICD-10-CM | POA: Diagnosis not present

## 2023-12-17 DIAGNOSIS — Z7982 Long term (current) use of aspirin: Secondary | ICD-10-CM

## 2023-12-17 DIAGNOSIS — Z79899 Other long term (current) drug therapy: Secondary | ICD-10-CM | POA: Diagnosis not present

## 2023-12-17 DIAGNOSIS — R0989 Other specified symptoms and signs involving the circulatory and respiratory systems: Secondary | ICD-10-CM | POA: Diagnosis not present

## 2023-12-17 DIAGNOSIS — E66811 Obesity, class 1: Secondary | ICD-10-CM | POA: Diagnosis present

## 2023-12-17 DIAGNOSIS — G20A1 Parkinson's disease without dyskinesia, without mention of fluctuations: Secondary | ICD-10-CM | POA: Diagnosis not present

## 2023-12-17 DIAGNOSIS — Z794 Long term (current) use of insulin: Secondary | ICD-10-CM | POA: Diagnosis not present

## 2023-12-17 DIAGNOSIS — J101 Influenza due to other identified influenza virus with other respiratory manifestations: Secondary | ICD-10-CM | POA: Diagnosis not present

## 2023-12-17 DIAGNOSIS — E1165 Type 2 diabetes mellitus with hyperglycemia: Secondary | ICD-10-CM

## 2023-12-17 DIAGNOSIS — F0280 Dementia in other diseases classified elsewhere without behavioral disturbance: Secondary | ICD-10-CM | POA: Diagnosis present

## 2023-12-17 DIAGNOSIS — I959 Hypotension, unspecified: Secondary | ICD-10-CM | POA: Diagnosis present

## 2023-12-17 DIAGNOSIS — E119 Type 2 diabetes mellitus without complications: Secondary | ICD-10-CM | POA: Diagnosis not present

## 2023-12-17 DIAGNOSIS — Z6831 Body mass index (BMI) 31.0-31.9, adult: Secondary | ICD-10-CM

## 2023-12-17 LAB — CBC WITH DIFFERENTIAL/PLATELET
Abs Immature Granulocytes: 0.02 10*3/uL (ref 0.00–0.07)
Basophils Absolute: 0 10*3/uL (ref 0.0–0.1)
Basophils Relative: 0 %
Eosinophils Absolute: 0 10*3/uL (ref 0.0–0.5)
Eosinophils Relative: 0 %
HCT: 38.9 % — ABNORMAL LOW (ref 39.0–52.0)
Hemoglobin: 12.9 g/dL — ABNORMAL LOW (ref 13.0–17.0)
Immature Granulocytes: 0 %
Lymphocytes Relative: 18 %
Lymphs Abs: 1.2 10*3/uL (ref 0.7–4.0)
MCH: 31.2 pg (ref 26.0–34.0)
MCHC: 33.2 g/dL (ref 30.0–36.0)
MCV: 94 fL (ref 80.0–100.0)
Monocytes Absolute: 0.5 10*3/uL (ref 0.1–1.0)
Monocytes Relative: 8 %
Neutro Abs: 4.8 10*3/uL (ref 1.7–7.7)
Neutrophils Relative %: 74 %
Platelets: 185 10*3/uL (ref 150–400)
RBC: 4.14 MIL/uL — ABNORMAL LOW (ref 4.22–5.81)
RDW: 12.1 % (ref 11.5–15.5)
WBC: 6.6 10*3/uL (ref 4.0–10.5)
nRBC: 0 % (ref 0.0–0.2)

## 2023-12-17 LAB — APTT: aPTT: 29 s (ref 24–36)

## 2023-12-17 LAB — TROPONIN I (HIGH SENSITIVITY)
Troponin I (High Sensitivity): 13 ng/L (ref <18)
Troponin I (High Sensitivity): 13 ng/L (ref <18)

## 2023-12-17 LAB — COMPREHENSIVE METABOLIC PANEL
ALT: 27 U/L (ref 0–44)
AST: 93 U/L — ABNORMAL HIGH (ref 15–41)
Albumin: 3.9 g/dL (ref 3.5–5.0)
Alkaline Phosphatase: 47 U/L (ref 38–126)
Anion gap: 12 (ref 5–15)
BUN: 22 mg/dL (ref 8–23)
CO2: 21 mmol/L — ABNORMAL LOW (ref 22–32)
Calcium: 9 mg/dL (ref 8.9–10.3)
Chloride: 103 mmol/L (ref 98–111)
Creatinine, Ser: 1.02 mg/dL (ref 0.61–1.24)
GFR, Estimated: >60 mL/min (ref >60)
Glucose, Bld: 147 mg/dL — ABNORMAL HIGH (ref 70–99)
Potassium: 4.2 mmol/L (ref 3.5–5.1)
Sodium: 136 mmol/L (ref 135–145)
Total Bilirubin: 0.8 mg/dL (ref 0.0–1.2)
Total Protein: 8.1 g/dL (ref 6.5–8.1)

## 2023-12-17 LAB — CBG MONITORING, ED: Glucose-Capillary: 145 mg/dL — ABNORMAL HIGH (ref 70–99)

## 2023-12-17 LAB — BLOOD GAS, VENOUS
Acid-base deficit: 1 mmol/L (ref 0.0–2.0)
Bicarbonate: 24.3 mmol/L (ref 20.0–28.0)
O2 Saturation: 53.1 %
Patient temperature: 37
pCO2, Ven: 42 mm[Hg] — ABNORMAL LOW (ref 44–60)
pH, Ven: 7.37 (ref 7.25–7.43)
pO2, Ven: 33 mm[Hg] (ref 32–45)

## 2023-12-17 LAB — RESP PANEL BY RT-PCR (RSV, FLU A&B, COVID)  RVPGX2
Influenza A by PCR: POSITIVE — AB
Influenza B by PCR: NEGATIVE
Resp Syncytial Virus by PCR: NEGATIVE
SARS Coronavirus 2 by RT PCR: NEGATIVE

## 2023-12-17 LAB — GLUCOSE, CAPILLARY
Glucose-Capillary: 107 mg/dL — ABNORMAL HIGH (ref 70–99)
Glucose-Capillary: 106 mg/dL — ABNORMAL HIGH (ref 70–99)

## 2023-12-17 LAB — PROTIME-INR
INR: 1.2 (ref 0.8–1.2)
Prothrombin Time: 14.9 s (ref 11.4–15.2)

## 2023-12-17 LAB — I-STAT CG4 LACTIC ACID, ED: Lactic Acid, Venous: 1.7 mmol/L (ref 0.5–1.9)

## 2023-12-17 MED ORDER — ALBUTEROL SULFATE (2.5 MG/3ML) 0.083% IN NEBU: 2.5000 mg | INHALATION_SOLUTION | RESPIRATORY_TRACT | Status: DC | PRN

## 2023-12-17 MED ORDER — INSULIN GLARGINE-YFGN 100 UNIT/ML ~~LOC~~ SOLN: 15.0000 [IU] | Freq: Every day | SUBCUTANEOUS | Status: DC

## 2023-12-17 MED ORDER — ACETAMINOPHEN 325 MG PO TABS
650.0000 mg | ORAL_TABLET | Freq: Once | ORAL | Status: AC
  Administered 2023-12-17: 650 mg via ORAL
  Filled 2023-12-17: qty 2

## 2023-12-17 MED ORDER — ASPIRIN 81 MG PO TBEC
81.0000 mg | DELAYED_RELEASE_TABLET | Freq: Every day | ORAL | Status: DC
  Administered 2023-12-18 – 2023-12-24 (×7): 81 mg via ORAL
  Filled 2023-12-17 (×7): qty 1

## 2023-12-17 MED ORDER — OSELTAMIVIR PHOSPHATE 75 MG PO CAPS
75.0000 mg | ORAL_CAPSULE | Freq: Two times a day (BID) | ORAL | Status: DC
  Administered 2023-12-18 – 2023-12-22 (×10): 75 mg via ORAL
  Filled 2023-12-17 (×12): qty 1

## 2023-12-17 MED ORDER — ACETAMINOPHEN 325 MG PO TABS
650.0000 mg | ORAL_TABLET | Freq: Four times a day (QID) | ORAL | Status: DC | PRN
  Administered 2023-12-19 (×2): 650 mg via ORAL
  Filled 2023-12-17 (×2): qty 2

## 2023-12-17 MED ORDER — CARBIDOPA-LEVODOPA 25-100 MG PO TABS
1.0000 | ORAL_TABLET | Freq: Four times a day (QID) | ORAL | Status: DC
  Administered 2023-12-17 – 2023-12-24 (×26): 1 via ORAL
  Filled 2023-12-17 (×29): qty 1

## 2023-12-17 MED ORDER — INSULIN GLARGINE-YFGN 100 UNIT/ML ~~LOC~~ SOLN
15.0000 [IU] | Freq: Every day | SUBCUTANEOUS | Status: DC
  Administered 2023-12-18 – 2023-12-23 (×6): 15 [IU] via SUBCUTANEOUS
  Filled 2023-12-17 (×7): qty 0.15

## 2023-12-17 MED ORDER — INSULIN ASPART 100 UNIT/ML IJ SOLN
0.0000 [IU] | Freq: Three times a day (TID) | INTRAMUSCULAR | Status: DC
Start: 2023-12-17 — End: 2023-12-24
  Administered 2023-12-18 – 2023-12-19 (×4): 2 [IU] via SUBCUTANEOUS
  Administered 2023-12-20: 3 [IU] via SUBCUTANEOUS
  Administered 2023-12-20: 2 [IU] via SUBCUTANEOUS
  Administered 2023-12-21: 3 [IU] via SUBCUTANEOUS
  Administered 2023-12-21 (×2): 2 [IU] via SUBCUTANEOUS
  Administered 2023-12-22 (×2): 3 [IU] via SUBCUTANEOUS
  Administered 2023-12-23 (×2): 2 [IU] via SUBCUTANEOUS
  Filled 2023-12-17: qty 0.15

## 2023-12-17 MED ORDER — ATORVASTATIN CALCIUM 10 MG PO TABS
10.0000 mg | ORAL_TABLET | Freq: Every day | ORAL | Status: DC
  Administered 2023-12-18 – 2023-12-24 (×7): 10 mg via ORAL
  Filled 2023-12-17 (×7): qty 1

## 2023-12-17 MED ORDER — LACTATED RINGERS IV BOLUS
1000.0000 mL | Freq: Once | INTRAVENOUS | Status: AC
  Administered 2023-12-17: 1000 mL via INTRAVENOUS

## 2023-12-17 MED ORDER — ENOXAPARIN SODIUM 40 MG/0.4ML IJ SOSY
40.0000 mg | PREFILLED_SYRINGE | INTRAMUSCULAR | Status: DC
  Administered 2023-12-17 – 2023-12-23 (×7): 40 mg via SUBCUTANEOUS
  Filled 2023-12-17 (×7): qty 0.4

## 2023-12-17 MED ORDER — ACETAMINOPHEN 650 MG RE SUPP: 650.0000 mg | Freq: Four times a day (QID) | RECTAL | Status: DC | PRN

## 2023-12-17 MED ORDER — INSULIN ASPART 100 UNIT/ML IJ SOLN
0.0000 [IU] | Freq: Every day | INTRAMUSCULAR | Status: DC
  Filled 2023-12-17: qty 0.05

## 2023-12-17 MED ORDER — ONDANSETRON HCL 4 MG PO TABS: 4.0000 mg | ORAL_TABLET | Freq: Four times a day (QID) | ORAL | Status: DC | PRN

## 2023-12-17 MED ORDER — LISINOPRIL 10 MG PO TABS
5.0000 mg | ORAL_TABLET | Freq: Every day | ORAL | Status: DC
  Administered 2023-12-18 – 2023-12-24 (×7): 5 mg via ORAL
  Filled 2023-12-17 (×7): qty 1

## 2023-12-17 MED ORDER — TRAZODONE HCL 50 MG PO TABS
25.0000 mg | ORAL_TABLET | Freq: Every evening | ORAL | Status: DC | PRN
  Administered 2023-12-19 – 2023-12-22 (×3): 25 mg via ORAL
  Filled 2023-12-17 (×3): qty 1

## 2023-12-17 MED ORDER — OSELTAMIVIR PHOSPHATE 75 MG PO CAPS
75.0000 mg | ORAL_CAPSULE | Freq: Two times a day (BID) | ORAL | Status: DC
  Filled 2023-12-17: qty 1

## 2023-12-17 MED ORDER — ONDANSETRON HCL 4 MG/2ML IJ SOLN: 4.0000 mg | Freq: Four times a day (QID) | INTRAMUSCULAR | Status: DC | PRN

## 2023-12-17 MED ORDER — DONEPEZIL HCL 5 MG PO TABS
10.0000 mg | ORAL_TABLET | Freq: Every day | ORAL | Status: DC
  Administered 2023-12-17 – 2023-12-23 (×7): 10 mg via ORAL
  Filled 2023-12-17 (×7): qty 2

## 2023-12-17 NOTE — ED Provider Notes (Signed)
[place] EMERGENCY DEPARTMENT AT [facility] Provider Note   CSN: [id number] Arrival date & time: 12/17/23  1151     History  Chief Complaint  Patient presents with   Weakness   Hypotension    Edward Briggs is a 73 y.o. male.  HPI 73 year old male with a history of Parkinson's, diabetes, hypertension presents with leg weakness.  History is from patient and wife.  Patient has been experiencing difficulty walking and cannot walk on his own for the last 2 days.  He also has been having a cough and congestion during that time.  No fevers, headache, chest pain.  He has chronic back pain but does not seem to be new.  He has chronic confusion, especially disorientation to date, and frequently goes through spells where he sleeps often (which she seems to be doing now).  Normally he can walk unassisted but has had to have significant help to stand up and be able to walk at home.  Home Medications Prior to Admission medications   Medication Sig Start Date End Date Taking? Authorizing Provider  Accu-Chek Softclix Lancets lancets USE AS DIRECTED UP TO 4 TIMES DAILY 01/22/22   [name], MD  Ascorbic Acid (VITAMIN C PO) Take 1 tablet by mouth daily.    [provider]  aspirin 81 MG tablet Take 81 mg by mouth daily.    [provider]  atorvastatin (LIPITOR) 10 MG tablet Take 1 tablet (10 mg total) by mouth daily. 03/13/23   [name], MD  blood glucose meter kit and supplies Dispense based on patient and insurance preference. Use up to four times daily as directed. (FOR ICD-10 E10.9, E11.9). 11/25/21   [name], MD  Blood Glucose Monitoring Suppl (BLOOD GLUCOSE METER) kit Use as instructed 09/10/12   Peyton Najjar, MD  carbidopa-levodopa (SINEMET IR) 25-100 MG tablet Take 1 tablet by mouth 4 (four) times daily. 03/05/23 02/28/24  Windell Norfolk, MD  cephALEXin (KEFLEX) 500 MG capsule Take 1 capsule (500 mg total) by mouth 3 (three) times daily.  09/24/23   McDonald, Rachelle Hora, DPM  donepezil (ARICEPT) 10 MG tablet TAKE 1 TABLET BY MOUTH AT BEDTIME 11/18/23   Windell Norfolk, MD  glucose blood test strip Check blood sugar 3 times daily 11/25/21   [name], MD  Insulin Pen Needle (BD PEN NEEDLE NANO U/F) 32G X 4 MM MISC USE DAILY AS DIRECTED 03/28/21   [name], MD  LANTUS SOLOSTAR 100 UNIT/ML Solostar Pen INJECT 34 UNITS SUBCUTANEOUSLY ONCE DAILY 12/07/23   [name], MD  lisinopril (ZESTRIL) 5 MG tablet Take 1 tablet by mouth once daily 10/07/23   [name], MD  Metformin HCl 500 MG/5ML SOLN TAKE 10 ML BY MOUTH  TWICE DAILY 11/26/23   [name], MD  neomycin-polymyxin-hydrocortisone (CORTISPORIN) 3.5-10000-1 OTIC suspension Apply 1-2 drops daily after soaking and cover with bandaid 09/24/23   Edwin Cap, DPM      Allergies    Patient has no known allergies.    Review of Systems   Review of Systems  Constitutional:  Negative for fever.  HENT:  Positive for congestion.   Respiratory:  Positive for cough. Negative for shortness of breath.   Cardiovascular:  Negative for chest pain.  Gastrointestinal:  Negative for abdominal pain.  Neurological:  Positive for weakness. Negative for headaches.    Physical Exam Updated Vital Signs BP (!) 157/88   Pulse 73  Temp (!) 100.9 F (38.3 C) (Rectal)   Resp 17   Ht 6\' 1"  (1.854 m)   Wt 106.6 kg   SpO2 97%   BMI 31.00 kg/m  Physical Exam Vitals and nursing note reviewed.  Constitutional:      Appearance: He is well-developed. He is not diaphoretic.  HENT:     Head: Normocephalic and atraumatic.  Eyes:     Pupils: Pupils are equal, round, and reactive to light.  Cardiovascular:     Rate and Rhythm: Normal rate and regular rhythm.     Heart sounds: Normal heart sounds.  Pulmonary:     Effort: Pulmonary effort is normal.     Breath sounds: Normal breath sounds.  Abdominal:     Palpations: Abdomen is soft.     Tenderness: There is no  abdominal tenderness.  Musculoskeletal:     Cervical back: Normal range of motion. No rigidity or tenderness.     Thoracic back: No tenderness.     Lumbar back: No tenderness.  Skin:    General: Skin is warm and dry.  Neurological:     Mental Status: He is alert.     Comments: Patient falls asleep easily, but is also fairly easy to awaken. He has 5/5 strength in BUE, though 4/5 strength in BLE. He is oriented to person, place, year, disoriented to month and day (typical mental status per patient's wife).     ED Results / Procedures / Treatments   Labs (all labs ordered are listed, but only abnormal results are displayed) Labs Reviewed  RESP PANEL BY RT-PCR (RSV, FLU A&B, COVID)  RVPGX2 - Abnormal; Notable for the following components:      Result Value   Influenza A by PCR POSITIVE (*)    All other components within normal limits  COMPREHENSIVE METABOLIC PANEL - Abnormal; Notable for the following components:   CO2 21 (*)    Glucose, Bld 147 (*)    AST 93 (*)    All other components within normal limits  CBC WITH DIFFERENTIAL/PLATELET - Abnormal; Notable for the following components:   RBC 4.14 (*)    Hemoglobin 12.9 (*)    HCT 38.9 (*)    All other components within normal limits  BLOOD GAS, VENOUS - Abnormal; Notable for the following components:   pCO2, Ven 42 (*)    All other components within normal limits  CBG MONITORING, ED - Abnormal; Notable for the following components:   Glucose-Capillary 145 (*)    All other components within normal limits  CULTURE, BLOOD (ROUTINE X 2)  CULTURE, BLOOD (ROUTINE X 2)  PROTIME-INR  APTT  URINALYSIS, W/ REFLEX TO CULTURE (INFECTION SUSPECTED)  I-STAT CG4 LACTIC ACID, ED  TROPONIN I (HIGH SENSITIVITY)  TROPONIN I (HIGH SENSITIVITY)    EKG EKG Interpretation Date/Time:  Thursday December 17 2023 13:49:00 EST Ventricular Rate:  82 PR Interval:  129 QRS Duration:  67 QT Interval:  290 QTC Calculation: 339 R  Axis:   65  Text Interpretation: Sinus rhythm Abnormal R-wave progression, early transition nonspecific T waves similar to 2020 Confirmed by Pricilla Loveless [phone]) on 12/17/2023 2:08:29 PM  Radiology DG Chest Portable 1 View Result Date: 12/17/2023 CLINICAL DATA:  Weakness.  Congestion. EXAM: PORTABLE CHEST 1 VIEW COMPARISON:  07/22/2019. FINDINGS: Bilateral lung fields are clear. Bilateral costophrenic angles are clear. Normal cardio-mediastinal silhouette. No acute osseous abnormalities. The soft tissues are within normal limits. IMPRESSION: No active disease. Electronically Signed   By: Rhea Belton  Ramiro Harvest M.D.   On: 12/17/2023 15:13    Procedures Procedures    Medications Ordered in ED Medications  oseltamivir (TAMIFLU) capsule 75 mg (has no administration in time range)  acetaminophen (TYLENOL) tablet 650 mg (650 mg Oral Given 12/17/23 1356)  lactated ringers bolus 1,000 mL (1,000 mLs Intravenous New Bag/Given 12/17/23 1345)    ED Course/ Medical Decision Making/ A&P                                 Medical Decision Making Amount and/or Complexity of Data Reviewed Independent Historian: spouse Labs: ordered.    Details: Flu A positive Radiology: ordered and independent interpretation performed.    Details: No Pneumonia ECG/medicine tests: ordered and independent interpretation performed.    Details: Nonspecific T waves  Risk OTC drugs. Prescription drug management. Decision regarding hospitalization.   Patient is systemically weak, worst in his legs, from the flu.  Unfortunately, after some Tamiflu and fluids and Tylenol he is unable to walk and can barely stand without significant two-person assist.  This is a significant change from his baseline.  While he does not have an AKI or current potential) was hypotensive in triage but not any longer), I think he will need admission for supportive care and physical therapy.  He cannot be cared for at home in this state.  Of note, he  is pretty sleepy though he will wake up and family states this is something he often does.  His VBG shows no respiratory acidosis.  He has no focal deficits and since this is a recurrent issue for him I do not think a head CT is needed.  Discussed with Dr. Kirby Crigler for admission.        Final Clinical Impression(s) / ED Diagnoses Final diagnoses:  Influenza A    Rx / DC Orders ED Discharge Orders     None         Pricilla Loveless, MD 12/17/23 [phone]

## 2023-12-17 NOTE — ED Triage Notes (Signed)
Pt arrived via POV. C/o weakness causing inability to walk beginning 2x days ago. Pt has hx parkinsons, and has lapses in attention- wife said that is typical for them.   Equal sensation, and strength.

## 2023-12-17 NOTE — Plan of Care (Signed)
  Problem: Skin Integrity: Goal: Risk for impaired skin integrity will decrease Outcome: Progressing   Problem: Nutrition: Goal: Adequate nutrition will be maintained Outcome: Progressing   Problem: Safety: Goal: Ability to remain free from injury will improve Outcome: Progressing

## 2023-12-17 NOTE — ED Provider Triage Note (Signed)
Emergency Medicine Provider Triage Evaluation Note  KWABENA STRUTZ , a 73 y.o. male  was evaluated in triage.  Pt complains of weakness. Wife notice pt is having difficulty with walking for the past 3 days.  He's more weak than usual.  Does have hx of [place].  Pt endorse some congestion but no sneeze, cough, sore throat or dysuria.  Has chronic back pain.  Denies skin infection.  Denies focal numbness or focal weakness  Review of Systems  Positive: As above Negative: As above  Physical Exam  BP 95/80 (BP Location: Left Arm)   Pulse 92   Temp 99.9 F (37.7 C) (Oral)   Resp 20   Ht 6\' 1"  (1.854 m)   Wt 106.6 kg   SpO2 100%   BMI 31.00 kg/m  Gen:   Awake, no distress   Resp:  Normal effort  MSK:   Moves extremities without difficulty  Other:    Medical Decision Making  Medically screening exam initiated at 12:34 PM.  Appropriate orders placed.  Justice C Deavers was informed that the remainder of the evaluation will be completed by another provider, this initial triage assessment does not replace that evaluation, and the importance of remaining in the ED until their evaluation is complete.     Fayrene Helper, PA-C 12/17/23 1236

## 2023-12-17 NOTE — ED Notes (Signed)
Redrew blood gas and walked it down to lab

## 2023-12-17 NOTE — H&P (Signed)
History and Physical  Edward Briggs BJY:[id number] DOB: 10/26/1951 DOA: 12/17/2023  PCP: [name], MD   Chief Complaint: Weakness, fever  HPI: Edward Briggs is a 73 y.o. male with medical history significant for insulin-dependent type 2 diabetes, hypertension, Parkinson's disease being admitted to the hospital with weakness in the setting of influenza A infection.  History is provided by the patient's wife and son who are at the bedside.  He is typically pretty independent, wife was recently sick with a viral illness.  Over the last 3 to 4 days, the patient has been progressively weak, with some subjective fevers.  They deny any chest pain, significant cough, nausea, vomiting or other concerns.  At home the last couple of days, he has been much more weak than usual, has been requiring assistance.  Yesterday when he was in the bathroom, he got very weak and had a controlled fall.  His wife was present, she states that he was awake and alert the whole time, did not hit his head, and did not lose consciousness.  They have not noticed any focal neurologic deficit, slurred speech, confusion, facial droop, etc.  Workup in the emergency department as detailed below shows positive for influenza A, he was started on Tamiflu.  Lab work otherwise relatively unremarkable.  He had a low-grade fever and was given Tylenol.  Despite assistance in the emergency department, he was unable to get out of bed so hospitalist was contacted to consider observation.  Review of Systems: Please see HPI for pertinent positives and negatives. A complete 10 system review of systems are otherwise negative.  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   Past Surgical History:  Procedure Laterality Date   ABSCESS DRAINAGE     COLONOSCOPY     ~ 10 yrs ago    FRACTURE SURGERY     HARDWARE REMOVAL  03/26/2012   Procedure: HARDWARE REMOVAL;  Surgeon: Sherri Rad, MD;  Location: [facility];  Service: Orthopedics;  Laterality: Left;  hardware removal deep left ankle syndesmotic screws only and stress xrays ankle    ORIF ANKLE FRACTURE  12/03/2011   Procedure: OPEN REDUCTION INTERNAL FIXATION (ORIF) ANKLE FRACTURE;  Surgeon: Sherri Rad, MD;  Location: [facility];  Service: Orthopedics;  Laterality: Left;  left medial malleolus fracture   PILONIDAL CYST EXCISION  ~1982   SKIN GRAFT  ~1970   Lt great toe (graft from Lt thigh)   Social History:  reports that he quit smoking about 35 years ago. His smoking use included cigarettes. He has never used smokeless tobacco. He reports current alcohol use of about 1.0 standard drink of alcohol per week. He reports that he does not use drugs.  No Known Allergies  Family History  Problem Relation Age of Onset   Cancer Mother    Cancer Father    Colon cancer Neg Hx    Colon polyps Neg Hx    Esophageal cancer Neg Hx    Rectal cancer Neg Hx    Stomach cancer Neg Hx      Prior to Admission medications   Medication Sig Start Date End Date Taking? Authorizing Provider  Accu-Chek Softclix Lancets lancets USE AS DIRECTED UP TO 4 TIMES DAILY 01/22/22   Neva Seat,  Asencion Partridge, MD  Ascorbic Acid (VITAMIN C PO) Take 1 tablet by mouth daily.    [provider]  aspirin 81 MG tablet Take 81 mg by mouth daily.    [provider]  atorvastatin (LIPITOR) 10 MG tablet Take 1 tablet (10 mg total) by mouth daily. 03/13/23   [name], MD  blood glucose meter kit and supplies Dispense based on patient and insurance preference. Use up to four times daily as directed. (FOR ICD-10 E10.9, E11.9). 11/25/21   [name], MD  Blood Glucose Monitoring Suppl (BLOOD GLUCOSE METER) kit Use as instructed 09/10/12   Peyton Najjar, MD  carbidopa-levodopa (SINEMET IR) 25-100 MG  tablet Take 1 tablet by mouth 4 (four) times daily. 03/05/23 02/28/24  Windell Norfolk, MD  cephALEXin (KEFLEX) 500 MG capsule Take 1 capsule (500 mg total) by mouth 3 (three) times daily. 09/24/23   McDonald, Rachelle Hora, DPM  donepezil (ARICEPT) 10 MG tablet TAKE 1 TABLET BY MOUTH AT BEDTIME 11/18/23   Windell Norfolk, MD  glucose blood test strip Check blood sugar 3 times daily 11/25/21   [name], MD  Insulin Pen Needle (BD PEN NEEDLE NANO U/F) 32G X 4 MM MISC USE DAILY AS DIRECTED 03/28/21   [name], MD  LANTUS SOLOSTAR 100 UNIT/ML Solostar Pen INJECT 34 UNITS SUBCUTANEOUSLY ONCE DAILY 12/07/23   [name], MD  lisinopril (ZESTRIL) 5 MG tablet Take 1 tablet by mouth once daily 10/07/23   [name], MD  Metformin HCl 500 MG/5ML SOLN TAKE 10 ML BY MOUTH  TWICE DAILY 11/26/23   [name], MD  neomycin-polymyxin-hydrocortisone (CORTISPORIN) 3.5-10000-1 OTIC suspension Apply 1-2 drops daily after soaking and cover with bandaid 09/24/23   Edwin Cap, DPM    Physical Exam: BP (!) 157/88   Pulse 73   Temp (!) 100.9 F (38.3 C) (Rectal)   Resp 17   Ht 6\' 1"  (1.854 m)   Wt 106.6 kg   SpO2 97%   BMI 31.00 kg/m  General:  Alert, oriented, calm, in no acute distress, somewhat sleepy but arousable.  His wife and son are at the bedside.  He is resting comfortably on room air. Cardiovascular: RRR, no murmurs or rubs, no peripheral edema  Respiratory: clear to auscultation bilaterally, no wheezes, no crackles  Abdomen: soft, nontender, nondistended, normal bowel tones heard  Skin: dry, no rashes  Musculoskeletal: no joint effusions, normal range of motion  Psychiatric: appropriate affect, normal speech  Neurologic: extraocular muscles intact, clear speech, moving all extremities with intact sensorium         Labs on Admission:  Basic Metabolic Panel: Recent Labs  Lab 12/17/23 1313  NA 136  K 4.2  CL 103  CO2 21*  GLUCOSE 147*  BUN 22  CREATININE 1.02   CALCIUM 9.0   Liver Function Tests: Recent Labs  Lab 12/17/23 1313  AST 93*  ALT 27  ALKPHOS 47  BILITOT 0.8  PROT 8.1  ALBUMIN 3.9   No results for input(s): "LIPASE", "AMYLASE" in the last 168 hours. No results for input(s): "AMMONIA" in the last 168 hours. CBC: Recent Labs  Lab 12/17/23 1313  WBC 6.6  NEUTROABS 4.8  HGB 12.9*  HCT 38.9*  MCV 94.0  PLT 185   Cardiac Enzymes: No results for input(s): "CKTOTAL", "CKMB", "CKMBINDEX", "TROPONINI" in the last 168 hours. BNP (last 3 results) No results for input(s): "BNP" in the last 8760 hours.  ProBNP (  last 3 results) No results for input(s): "PROBNP" in the last 8760 hours.  CBG: Recent Labs  Lab 12/17/23 1302  GLUCAP 145*    Radiological Exams on Admission: DG Chest Portable 1 View Result Date: 12/17/2023 CLINICAL DATA:  Weakness.  Congestion. EXAM: PORTABLE CHEST 1 VIEW COMPARISON:  07/22/2019. FINDINGS: Bilateral lung fields are clear. Bilateral costophrenic angles are clear. Normal cardio-mediastinal silhouette. No acute osseous abnormalities. The soft tissues are within normal limits. IMPRESSION: No active disease. Electronically Signed   By: Jules Schick M.D.   On: 12/17/2023 15:13   Assessment/Plan Edward Briggs is a 72 y.o. male with medical history significant for insulin-dependent type 2 diabetes, hypertension, Parkinson's disease being admitted to the hospital with weakness in the setting of influenza A infection.  Influenza A infection-with significant weakness, unable to ambulate.  No focal neurological deficit, moving all extremities and following commands. -Observation admission -Tamiflu x 5 days  Insulin-dependent type 2 diabetes -Carb modified diet -Continue Lantus at reduced dose -Hold metformin, moderate dose sliding scale in the meantime -Will update hemoglobin A1c  Parkinson's dementia-continue Sinemet 4 times daily and Aricept at  bedtime  Hyperlipidemia-Lipitor  Hypertension-lisinopril  DVT prophylaxis: Lovenox     Code Status: Full Code  Consults called: None  Admission status: Observation  Time spent: 55 minutes  Arianny Pun Sharlette Dense MD [facility] Pager [phone]  If 7PM-7AM, please contact night-coverage [email or web address] Password [facility]  12/17/2023, 4:18 PM

## 2023-12-18 ENCOUNTER — Other Ambulatory Visit: Payer: Self-pay | Admitting: Neurology

## 2023-12-18 DIAGNOSIS — K219 Gastro-esophageal reflux disease without esophagitis: Secondary | ICD-10-CM | POA: Diagnosis present

## 2023-12-18 DIAGNOSIS — G8929 Other chronic pain: Secondary | ICD-10-CM | POA: Diagnosis present

## 2023-12-18 DIAGNOSIS — Z87891 Personal history of nicotine dependence: Secondary | ICD-10-CM | POA: Diagnosis not present

## 2023-12-18 DIAGNOSIS — Z6831 Body mass index (BMI) 31.0-31.9, adult: Secondary | ICD-10-CM | POA: Diagnosis not present

## 2023-12-18 DIAGNOSIS — R531 Weakness: Secondary | ICD-10-CM | POA: Diagnosis not present

## 2023-12-18 DIAGNOSIS — Z7401 Bed confinement status: Secondary | ICD-10-CM | POA: Diagnosis not present

## 2023-12-18 DIAGNOSIS — G20A1 Parkinson's disease without dyskinesia, without mention of fluctuations: Secondary | ICD-10-CM | POA: Diagnosis present

## 2023-12-18 DIAGNOSIS — Z1152 Encounter for screening for COVID-19: Secondary | ICD-10-CM | POA: Diagnosis not present

## 2023-12-18 DIAGNOSIS — G9341 Metabolic encephalopathy: Secondary | ICD-10-CM | POA: Diagnosis not present

## 2023-12-18 DIAGNOSIS — Z7982 Long term (current) use of aspirin: Secondary | ICD-10-CM | POA: Diagnosis not present

## 2023-12-18 DIAGNOSIS — G20C Parkinsonism, unspecified: Secondary | ICD-10-CM | POA: Diagnosis not present

## 2023-12-18 DIAGNOSIS — E109 Type 1 diabetes mellitus without complications: Secondary | ICD-10-CM | POA: Diagnosis not present

## 2023-12-18 DIAGNOSIS — F0280 Dementia in other diseases classified elsewhere without behavioral disturbance: Secondary | ICD-10-CM | POA: Diagnosis present

## 2023-12-18 DIAGNOSIS — Z794 Long term (current) use of insulin: Secondary | ICD-10-CM | POA: Diagnosis not present

## 2023-12-18 DIAGNOSIS — Z79899 Other long term (current) drug therapy: Secondary | ICD-10-CM | POA: Diagnosis not present

## 2023-12-18 DIAGNOSIS — M6281 Muscle weakness (generalized): Secondary | ICD-10-CM | POA: Diagnosis not present

## 2023-12-18 DIAGNOSIS — J101 Influenza due to other identified influenza virus with other respiratory manifestations: Secondary | ICD-10-CM | POA: Diagnosis present

## 2023-12-18 DIAGNOSIS — E119 Type 2 diabetes mellitus without complications: Secondary | ICD-10-CM | POA: Diagnosis not present

## 2023-12-18 DIAGNOSIS — Z743 Need for continuous supervision: Secondary | ICD-10-CM | POA: Diagnosis not present

## 2023-12-18 DIAGNOSIS — Z7984 Long term (current) use of oral hypoglycemic drugs: Secondary | ICD-10-CM | POA: Diagnosis not present

## 2023-12-18 DIAGNOSIS — R051 Acute cough: Secondary | ICD-10-CM | POA: Diagnosis not present

## 2023-12-18 DIAGNOSIS — R269 Unspecified abnormalities of gait and mobility: Secondary | ICD-10-CM

## 2023-12-18 DIAGNOSIS — I959 Hypotension, unspecified: Secondary | ICD-10-CM | POA: Diagnosis present

## 2023-12-18 DIAGNOSIS — E785 Hyperlipidemia, unspecified: Secondary | ICD-10-CM | POA: Diagnosis not present

## 2023-12-18 DIAGNOSIS — I10 Essential (primary) hypertension: Secondary | ICD-10-CM | POA: Diagnosis not present

## 2023-12-18 DIAGNOSIS — E66811 Obesity, class 1: Secondary | ICD-10-CM | POA: Diagnosis present

## 2023-12-18 DIAGNOSIS — D638 Anemia in other chronic diseases classified elsewhere: Secondary | ICD-10-CM | POA: Diagnosis not present

## 2023-12-18 LAB — GLUCOSE, CAPILLARY
Glucose-Capillary: 92 mg/dL (ref 70–99)
Glucose-Capillary: 153 mg/dL — ABNORMAL HIGH (ref 70–99)
Glucose-Capillary: 123 mg/dL — ABNORMAL HIGH (ref 70–99)
Glucose-Capillary: 137 mg/dL — ABNORMAL HIGH (ref 70–99)

## 2023-12-18 LAB — BASIC METABOLIC PANEL
Anion gap: 13 (ref 5–15)
BUN: 15 mg/dL (ref 8–23)
CO2: 20 mmol/L — ABNORMAL LOW (ref 22–32)
Calcium: 8.6 mg/dL — ABNORMAL LOW (ref 8.9–10.3)
Chloride: 105 mmol/L (ref 98–111)
Creatinine, Ser: 0.63 mg/dL (ref 0.61–1.24)
GFR, Estimated: >60 mL/min (ref >60)
Glucose, Bld: 107 mg/dL — ABNORMAL HIGH (ref 70–99)
Potassium: 3.8 mmol/L (ref 3.5–5.1)
Sodium: 138 mmol/L (ref 135–145)

## 2023-12-18 LAB — CBC
HCT: 38.2 % — ABNORMAL LOW (ref 39.0–52.0)
Hemoglobin: 12.3 g/dL — ABNORMAL LOW (ref 13.0–17.0)
MCH: 31.7 pg (ref 26.0–34.0)
MCHC: 32.2 g/dL (ref 30.0–36.0)
MCV: 98.5 fL (ref 80.0–100.0)
Platelets: 153 10*3/uL (ref 150–400)
RBC: 3.88 MIL/uL — ABNORMAL LOW (ref 4.22–5.81)
RDW: 12.1 % (ref 11.5–15.5)
WBC: 6.6 10*3/uL (ref 4.0–10.5)
nRBC: 0 % (ref 0.0–0.2)

## 2023-12-18 NOTE — Progress Notes (Signed)
PROGRESS NOTE  Edward Briggs:[id number] DOB: [birth year] DOA: 12/17/2023 PCP: [name], MD   LOS: 0 days   Brief Narrative / Interim history: 73 year old male with IDDM, HTN, Parkinson's disease, confusion comes into the hospital with profound weakness, increased cough for the past 2 to 3 days.  Wife and son have also had URI over the same amount of time.  He has been progressively weak, unable to ambulate on his own, with fevers.  He tested positive for influenza A and he was admitted to the hospital.  Subjective / 24h Interval events: Complains of a cough this morning, wife is at bedside.  She reports increased confusion and weakness, which is persistent today.  She does not appreciate any improvement  Assesement and Plan: Principal problem Influenza A infection-with significant weakness, PT evaluation pending.  No apparent improvement per wife.  Continue Tamiflu and supportive care  Active problems Acute metabolic encephalopathy on underlying Parkinson with dementia-continue home medications, likely confusion is increased in the setting of acute infection  Hypertension-continue lisinopril  Hyperlipidemia-continue statin  Obesity, class I-he would benefit from weight loss.  BMI 31  IDDM-continue Lantus, sliding scale.  Hold metformin.  A1c acceptable in his age group  Lab Results  Component Value Date   HGBA1C 7.8 (H) 09/18/2023   CBG (last 3)  Recent Labs    12/17/23 2101 12/18/23 0749 12/18/23 1204  GLUCAP 106* 92 153*    Scheduled Meds:  aspirin EC  81 mg Oral Daily   atorvastatin  10 mg Oral Daily   carbidopa-levodopa  1 tablet Oral QID   donepezil  10 mg Oral QHS   enoxaparin (LOVENOX) injection  40 mg Subcutaneous Q24H   insulin aspart  0-15 Units Subcutaneous TID WC   insulin aspart  0-5 Units Subcutaneous QHS   insulin glargine-yfgn  15 Units Subcutaneous QHS   lisinopril  5 mg Oral Daily   oseltamivir  75 mg Oral BID   Continuous  Infusions: PRN Meds:.acetaminophen **OR** acetaminophen, albuterol, ondansetron **OR** ondansetron (ZOFRAN) IV, traZODone  Current Outpatient Medications  Medication Instructions   Accu-Chek Softclix Lancets lancets USE AS DIRECTED UP TO 4 TIMES DAILY   aspirin 81 mg, Every 7 days   atorvastatin (LIPITOR) 10 mg, Oral, Daily   blood glucose meter kit and supplies Dispense based on patient and insurance preference. Use up to four times daily as directed. (FOR ICD-10 E10.9, E11.9).   Blood Glucose Monitoring Suppl (BLOOD GLUCOSE METER) kit Use as instructed   carbidopa-levodopa (SINEMET IR) 25-100 MG tablet 1 tablet, Oral, 4 times daily   cephALEXin (KEFLEX) 500 mg, Oral, 3 times daily   donepezil (ARICEPT) 10 mg, Oral, Daily at bedtime   glucose blood test strip Check blood sugar 3 times daily   Insulin Pen Needle (BD PEN NEEDLE NANO U/F) 32G X 4 MM MISC USE DAILY AS DIRECTED   LANTUS SOLOSTAR 100 UNIT/ML Solostar Pen INJECT 34 UNITS SUBCUTANEOUSLY ONCE DAILY   lisinopril (ZESTRIL) 5 mg, Oral, Daily   Metformin HCl 500 MG/5ML SOLN TAKE 10 ML BY MOUTH  TWICE DAILY   neomycin-polymyxin-hydrocortisone (CORTISPORIN) 3.5-10000-1 OTIC suspension Apply 1-2 drops daily after soaking and cover with bandaid   TYLENOL 500-1,000 mg, Oral, Every 6 hours PRN    Diet Orders (From admission, onward)     Start     Ordered   12/17/23 1617  Diet Carb Modified Fluid consistency: Thin; Room service appropriate? Yes  Diet effective now  Question Answer Comment  Diet-HS Snack? Nothing   Calorie Level Medium 1600-2000   Fluid consistency: Thin   Room service appropriate? Yes      12/17/23 1618            DVT prophylaxis: enoxaparin (LOVENOX) injection 40 mg Start: 12/17/23 2200 SCDs Start: 12/17/23 1617   Lab Results  Component Value Date   PLT 153 12/18/2023      Code Status: Full Code  Family Communication: Wife at bedside  Status is: Observation The patient will require care  spanning > 2 midnights and should be moved to inpatient because: Persistent encephalopathy, weakness   Level of care: Med-Surg  Consultants:  None  Objective: Vitals:   12/17/23 1714 12/17/23 2219 12/18/23 0114 12/18/23 0526  BP: (!) 152/84 136/77 (!) 131/108 (!) 151/83  Pulse: 66 86 82 77  Resp: 18 18 20 17   Temp: 99.5 F (37.5 C) (!) 100.8 F (38.2 C) 99.9 F (37.7 C) 98.6 F (37 C)  TempSrc:  Oral Oral Oral  SpO2: 99% 100% 99% 99%  Weight: 106.6 kg     Height: 6\' 1"  (1.854 m)       Intake/Output Summary (Last 24 hours) at 12/18/2023 1223 Last data filed at 12/17/2023 2200 Gross per 24 hour  Intake 240 ml  Output --  Net 240 ml   Wt Readings from Last 3 Encounters:  12/17/23 106.6 kg  09/18/23 111.8 kg  06/18/23 111.8 kg    Examination:  Constitutional: NAD Eyes: no scleral icterus ENMT: Mucous membranes are moist.  Neck: normal, supple Respiratory: clear to auscultation bilaterally, no wheezing, no crackles. Normal respiratory effort. No accessory muscle use.  Cardiovascular: Regular rate and rhythm, no murmurs / rubs / gallops. No LE edema.  Abdomen: non distended, no tenderness. Bowel sounds positive.  Musculoskeletal: no clubbing / cyanosis.    Data Reviewed: I have independently reviewed following labs and imaging studies   CBC Recent Labs  Lab 12/17/23 1313 12/18/23 0438  WBC 6.6 6.6  HGB 12.9* 12.3*  HCT 38.9* 38.2*  PLT 185 153  MCV 94.0 98.5  MCH 31.2 31.7  MCHC 33.2 32.2  RDW 12.1 12.1  LYMPHSABS 1.2  --   MONOABS 0.5  --   EOSABS 0.0  --   BASOSABS 0.0  --     Recent Labs  Lab 12/17/23 1310 12/17/23 1313 12/17/23 1339 12/18/23 0438  NA  --  136  --  138  K  --  4.2  --  3.8  CL  --  103  --  105  CO2  --  21*  --  20*  GLUCOSE  --  147*  --  107*  BUN  --  22  --  15  CREATININE  --  1.02  --  0.63  CALCIUM  --  9.0  --  8.6*  AST  --  93*  --   --   ALT  --  27  --   --   ALKPHOS  --  47  --   --   BILITOT  --  0.8   --   --   ALBUMIN  --  3.9  --   --   LATICACIDVEN 1.7  --   --   --   INR  --   --  1.2  --     ------------------------------------------------------------------------------------------------------------------ No results for input(s): "CHOL", "HDL", "LDLCALC", "TRIG", "CHOLHDL", "LDLDIRECT" in the last 72 hours.  Lab Results  Component Value Date   HGBA1C 7.8 (H) 09/18/2023   ------------------------------------------------------------------------------------------------------------------ No results for input(s): "TSH", "T4TOTAL", "T3FREE", "THYROIDAB" in the last 72 hours.  Invalid input(s): "FREET3"  Cardiac Enzymes No results for input(s): "CKMB", "TROPONINI", "MYOGLOBIN" in the last 168 hours.  Invalid input(s): "CK" ------------------------------------------------------------------------------------------------------------------ No results found for: "BNP"  CBG: Recent Labs  Lab 12/17/23 1302 12/17/23 1837 12/17/23 2101 12/18/23 0749 12/18/23 1204  GLUCAP 145* 107* 106* 92 153*    Recent Results (from the past 240 hours)  Resp panel by RT-PCR (RSV, Flu A&B, Covid) Anterior Nasal Swab     Status: Abnormal   Collection Time: 12/17/23  1:13 PM   Specimen: Anterior Nasal Swab  Result Value Ref Range Status   SARS Coronavirus 2 by RT PCR NEGATIVE NEGATIVE Final    Comment: (NOTE) SARS-CoV-2 target nucleic acids are NOT DETECTED.  The SARS-CoV-2 RNA is generally detectable in upper respiratory specimens during the acute phase of infection. The lowest concentration of SARS-CoV-2 viral copies this assay can detect is 138 copies/mL. A negative result does not preclude SARS-Cov-2 infection and should not be used as the sole basis for treatment or other patient management decisions. A negative result may occur with  improper specimen collection/handling, submission of specimen other than nasopharyngeal swab, presence of viral mutation(s) within the areas targeted by  this assay, and inadequate number of viral copies(<138 copies/mL). A negative result must be combined with clinical observations, patient history, and epidemiological information. The expected result is Negative.  Fact Sheet for Patients:  BloggerCourse.com  Fact Sheet for Healthcare Providers:  SeriousBroker.it  This test is no t yet approved or cleared by the [place] FDA and  has been authorized for detection and/or diagnosis of SARS-CoV-2 by FDA under an Emergency Use Authorization (EUA). This EUA will remain  in effect (meaning this test can be used) for the duration of the COVID-19 declaration under Section 564(b)(1) of the Act, 21 U.S.C.section 360bbb-3(b)(1), unless the authorization is terminated  or revoked sooner.       Influenza A by PCR POSITIVE (A) NEGATIVE Final   Influenza B by PCR NEGATIVE NEGATIVE Final    Comment: (NOTE) The Xpert Xpress SARS-CoV-2/FLU/RSV plus assay is intended as an aid in the diagnosis of influenza from Nasopharyngeal swab specimens and should not be used as a sole basis for treatment. Nasal washings and aspirates are unacceptable for Xpert Xpress SARS-CoV-2/FLU/RSV testing.  Fact Sheet for Patients: BloggerCourse.com  Fact Sheet for Healthcare Providers: SeriousBroker.it  This test is not yet approved or cleared by the [place] FDA and has been authorized for detection and/or diagnosis of SARS-CoV-2 by FDA under an Emergency Use Authorization (EUA). This EUA will remain in effect (meaning this test can be used) for the duration of the COVID-19 declaration under Section 564(b)(1) of the Act, 21 U.S.C. section 360bbb-3(b)(1), unless the authorization is terminated or revoked.     Resp Syncytial Virus by PCR NEGATIVE NEGATIVE Final    Comment: (NOTE) Fact Sheet for Patients: BloggerCourse.com  Fact  Sheet for Healthcare Providers: SeriousBroker.it  This test is not yet approved or cleared by the [place] FDA and has been authorized for detection and/or diagnosis of SARS-CoV-2 by FDA under an Emergency Use Authorization (EUA). This EUA will remain in effect (meaning this test can be used) for the duration of the COVID-19 declaration under Section 564(b)(1) of the Act, 21 U.S.C. section 360bbb-3(b)(1), unless the authorization is terminated or revoked.  Performed at [facility], [address], [place], [place] [address]   Blood Culture (routine x 2)     Status: None (Preliminary result)   Collection Time: 12/17/23  1:35 PM   Specimen: BLOOD  Result Value Ref Range Status   Specimen Description   Final    BLOOD RIGHT ANTECUBITAL Performed at [facility], [address], [place], [place] [address]    Special Requests   Final    BOTTLES DRAWN AEROBIC AND ANAEROBIC Blood Culture results may not be optimal due to an inadequate volume of blood received in culture bottles Performed at [facility], [address], [place], [place] [address]    Culture   Final    NO GROWTH < 24 HOURS Performed at [facility], [address], [place], [place] [address]    Report Status PENDING  Incomplete  Blood Culture (routine x 2)     Status: None (Preliminary result)   Collection Time: 12/17/23  1:39 PM   Specimen: BLOOD  Result Value Ref Range Status   Specimen Description   Final    BLOOD LEFT ANTECUBITAL Performed at [facility], [address], [place], [place] [address]    Special Requests   Final    BOTTLES DRAWN AEROBIC AND ANAEROBIC Blood Culture results may not be optimal due to an inadequate volume of blood received in culture bottles Performed at [facility], [address], [place], [place] [address]    Culture   Final    NO GROWTH < 24  HOURS Performed at [facility], [address], [place], [place] [address]    Report Status PENDING  Incomplete     Radiology Studies: DG Chest Portable 1 View Result Date: 12/17/2023 CLINICAL DATA:  Weakness.  Congestion. EXAM: PORTABLE CHEST 1 VIEW COMPARISON:  07/22/2019. FINDINGS: Bilateral lung fields are clear. Bilateral costophrenic angles are clear. Normal cardio-mediastinal silhouette. No acute osseous abnormalities. The soft tissues are within normal limits. IMPRESSION: No active disease. Electronically Signed   By: Jules Schick M.D.   On: 12/17/2023 15:13     Pamella Pert, MD, PhD [facility]  Between 7 am - 7 pm I am available, please contact me via Amion (for emergencies) or Securechat (non urgent messages)  Between 7 pm - 7 am I am not available, please contact night coverage MD/APP via Amion

## 2023-12-18 NOTE — Evaluation (Signed)
Physical Therapy Evaluation Patient Details Name: Edward Briggs MRN: [id number] DOB: [birth year] Today's Date: 12/18/2023  History of Present Illness  Edward Briggs is a 73 y.o. male with medical history significant for insulin-dependent type 2 diabetes, hypertension, Parkinson's disease being admitted to the hospital with weakness in the setting of influenza A infection.  Clinical Impression  Pt admitted with above diagnosis. Per pt, he was ind with RW and SPC in the home, able to cook, ind with self care, spouse and son present at home and also providing transportation. Pt active with outpatient PT PTA. On eval, alert and oriented x4, though needing increased time to answer all questions, increased cues with mobility, and appears to fall asleep at times while speaking to therapists. Pt needing maxA+2 for transfers and limited marching in front of recliner, unable to take steps away from recliner, pt fatigues easily needing seated rest break. Pt demonstrates significant posterior lean with transfers, very rigid with all mobility needing cues and assist for safety and function. Patient will benefit from continued inpatient follow up therapy, <3 hours/day. Pt currently with functional limitations due to the deficits listed below (see PT Problem List). Pt will benefit from acute skilled PT to increase their independence and safety with mobility to allow discharge.           If plan is discharge home, recommend the following: Two people to help with walking and/or transfers;Two people to help with bathing/dressing/bathroom;Assist for transportation;Assistance with cooking/housework;Help with stairs or ramp for entrance   Can travel by private vehicle   No    Equipment Recommendations None recommended by PT  Recommendations for Other Services       Functional Status Assessment Patient has had a recent decline in their functional status and demonstrates the ability to make significant  improvements in function in a reasonable and predictable amount of time.     Precautions / Restrictions Precautions Precautions: Fall Precaution/Restrictions Comments: DROPLET Restrictions Weight Bearing Restrictions Per Provider Order: No Other Position/Activity Restrictions: Parkinson's      Mobility  Bed Mobility               General bed mobility comments: Patient received in recliner.    Transfers Overall transfer level: Needs assistance Equipment used: Rolling walker (2 wheels) Transfers: Sit to/from Stand Sit to Stand: Max assist, +2 physical assistance           General transfer comment: max A+2 for STS reps from recliner, guiding cues for correct hand placement to power up and return to sit, significant posterior lean and stiffness at hips noted rqeuiring assistance to shift weight forward to power up and bend at hips to return to sitting as pt leans back strongly into recliner with return to sit    Ambulation/Gait Ambulation/Gait assistance: Mod assist, +2 physical assistance             General Gait Details: static marching with RW in front of recliner, pt needing siginificant assist to weight shift R and L to allow for marching of opposite LE, very rigid and slow parkinson-like movement, unable to take steps away from chair due to weakness  Stairs            Wheelchair Mobility     Tilt Bed    Modified Rankin (Stroke Patients Only)       Balance Overall balance assessment: History of Falls, Needs assistance   Sitting balance-Leahy Scale: Fair Sitting balance - Comments: Only sitting with back support.  Unable to anteriorly scoot in chair. Postural control: Posterior lean Standing balance support: Bilateral upper extremity supported, During functional activity, Reliant on assistive device for balance Standing balance-Leahy Scale: Poor                               Pertinent Vitals/Pain Pain Assessment Pain Assessment:  No/denies pain    Home Living Family/patient expects to be discharged to:: Private residence Living Arrangements: Spouse/significant other;Children (Spouse and adult son) Available Help at Discharge: Available PRN/intermittently Type of Home: House Home Access: Stairs to enter Entrance Stairs-Rails: Right;Left;Can reach both Entrance Stairs-Number of Steps: 3 Alternate Level Stairs-Number of Steps: 13 Home Layout: Two level;Bed/bath upstairs Home Equipment: Agricultural consultant (2 wheels);Cane - single point Additional Comments: Active with outpatient therapy.  Per chart: "Per patient's wife and son, He is typically pretty independent,"    Prior Function Prior Level of Function : Independent/Modified Independent;History of Falls (last six months)             Mobility Comments: Pt reports using RW and SPC in the home and community. Pt reports 1 fall in the last 6 months. ADLs Comments: Pt reports that he performs his own ADLs and cooks somewhat. Family driving.     Extremity/Trunk Assessment   Upper Extremity Assessment Upper Extremity Assessment: Defer to OT evaluation RUE Deficits / Details: AROM shoulder ~70-75 degrees. PROM: ~110 degrees.  MMT: WFL within this range. elbow ~3/5 to 3+/5, wrist and hand generalized weakness. Pt not using his RUE for a while while feeding and required multi-modal cues to involve his dominent hand in feeding. Pt then able to hold fruit cup in LT hand and feed self with RT. LUE Deficits / Details: AROM shoulder ~70-75 degrees. PROM: ~110 degrees. MMT: WFL within this range. elbow ~3/5 to 3+/5, wrist and hand generalized weakness. LUE Coordination: decreased fine motor    Lower Extremity Assessment Lower Extremity Assessment: Generalized weakness (symmetrical, grossly 3+/5, denies numbness/tingling)    Cervical / Trunk Assessment Cervical / Trunk Assessment: Kyphotic  Communication   Communication Communication: Impaired Factors Affecting  Communication: Other (comment) (Speech is slow and soft.)    Cognition Arousal: Alert Behavior During Therapy: Flat affect, WFL for tasks assessed/performed                             Following commands: Impaired Following commands impaired: Follows one step commands with increased time (Needs increased time)     Cueing Cueing Techniques: Verbal cues, Gestural cues, Tactile cues, Visual cues     General Comments General comments (skin integrity, edema, etc.): on RA with SpO2 99% post stand and HR 91    Exercises     Assessment/Plan    PT Assessment Patient needs continued PT services  PT Problem List Decreased strength;Decreased activity tolerance;Decreased balance;Decreased mobility;Decreased cognition;Cardiopulmonary status limiting activity       PT Treatment Interventions DME instruction;Gait training;Stair training;Functional mobility training;Therapeutic activities;Therapeutic exercise;Balance training;Neuromuscular re-education;Cognitive remediation;Patient/family education;Manual techniques    PT Goals (Current goals can be found in the Care Plan section)  Acute Rehab PT Goals Patient Stated Goal: agreeable to therapy PT Goal Formulation: With patient Time For Goal Achievement: 01/01/24 Potential to Achieve Goals: Good    Frequency Min 1X/week     Co-evaluation               AM-PAC PT "6 Clicks" Mobility  Outcome  Measure Help needed turning from your back to your side while in a flat bed without using bedrails?: Total Help needed moving from lying on your back to sitting on the side of a flat bed without using bedrails?: Total Help needed moving to and from a bed to a chair (including a wheelchair)?: Total Help needed standing up from a chair using your arms (e.g., wheelchair or bedside chair)?: Total Help needed to walk in hospital room?: Total Help needed climbing 3-5 steps with a railing? : Total 6 Click Score: 6    End of Session  Equipment Utilized During Treatment: Gait belt Activity Tolerance: Patient tolerated treatment well Patient left: in chair;with call bell/phone within reach (no alarm upon arrival with pt in chair) Nurse Communication: Mobility status PT Visit Diagnosis: Muscle weakness (generalized) (M62.81);Other symptoms and signs involving the nervous system (R29.898);Difficulty in walking, not elsewhere classified (R26.2)    Time: 1610-9604 PT Time Calculation (min) (ACUTE ONLY): 38 min   Charges:   PT Evaluation $PT Eval Moderate Complexity: 1 Mod   PT General Charges $$ ACUTE PT VISIT: 1 Visit         Tori Kaisyn Reinhold PT, DPT 12/18/23, 1:07 PM

## 2023-12-18 NOTE — Plan of Care (Signed)
Problem: Education: Goal: Ability to describe self-care measures that may prevent or decrease complications (Diabetes Survival Skills Education) will improve Outcome: Not Progressing Goal: Individualized Educational Video(s) Outcome: Not Progressing

## 2023-12-18 NOTE — Telephone Encounter (Signed)
Order completed, please review. Thanks

## 2023-12-18 NOTE — Evaluation (Signed)
Occupational Therapy Evaluation Patient Details Name: Edward Briggs MRN: [id number] DOB: 28-Feb-1951 Today's Date: 12/18/2023   History of Present Illness   Edward Briggs is a 73 y.o. male with medical history significant for insulin-dependent type 2 diabetes, hypertension, Parkinson's disease being admitted to the hospital with weakness in the setting of influenza A infection.     Clinical Impressions Patient is currently requiring assistance with ADLs including Total assist with Lower body ADLs in bed level or chair position, and up to maximum assist with Upper body ADLs in chair position, as well as Maximum assist of 2 peopple with sit to stand functional transfers, RW and inability to take steps yet, but did march in place a few times then stopped, fatigued.   Current level of function is below patient's typical baseline which is "mostly Independent" per chart.    During this evaluation, patient was limited by apparent exacerbation of Parkinson's symptoms in setting of Flu with body rigidity, decreased initiation, generalized weakness, impaired activity tolerance, and visual disturbances, all of which has the potential to impact patient's and/or caregivers' safety and independence during functional mobility, as well as performance for ADLs.    Patient lives with his family who are able to provide some level of supervision and assistance, but present during Evaluation.  Patient demonstrates good rehab potential, and should benefit from continued skilled occupational therapy services while in acute care to maximize safety, independence and quality of life at home.  Continued occupational therapy services are recommended. Patient will benefit from continued inpatient follow up therapy, <3 hours/day   ?      If plan is discharge home, recommend the following:   A lot of help with bathing/dressing/bathroom;Supervision due to cognitive status;Assistance with feeding;Two people to help  with walking and/or transfers;A lot of help with walking and/or transfers;Assist for transportation;Assistance with cooking/housework;Help with stairs or ramp for entrance     Functional Status Assessment   Patient has had a recent decline in their functional status and demonstrates the ability to make significant improvements in function in a reasonable and predictable amount of time.     Equipment Recommendations   BSC/3in1 (Will also defer to next LOC)     Recommendations for Other Services         Precautions/Restrictions   Precautions Precautions: Fall Precaution/Restrictions Comments: DROPLET Restrictions Other Position/Activity Restrictions: Parkinson's     Mobility Bed Mobility               General bed mobility comments: Patient received in recliner.    Transfers                          Balance Overall balance assessment: History of Falls, Needs assistance   Sitting balance-Leahy Scale: Fair Sitting balance - Comments: Only sitting with back support. Unable to anteriorly scoot in chair. Postural control: Posterior lean Standing balance support: Bilateral upper extremity supported, During functional activity, Reliant on assistive device for balance Standing balance-Leahy Scale: Poor                             ADL either performed or assessed with clinical judgement   ADL Overall ADL's : Needs assistance/impaired Eating/Feeding: Sitting;Maximal assistance;Moderate assistance Eating/Feeding Details (indicate cue type and reason): Pt not using his RUE for a while while feeding and required multi-modal cues to involve his dominent hand in feeding. Pt then able to hold  fruit cup in LT hand and feed self with RT. With increased cues/setup, training pt improved feeding from Max Assist to Mod assist and then Min Assist. Pt's tray was removed due to multiple images on it and pt reaching for wrong things. Once just fruit cup on blacnk  table, pt reaching much better. Pt did report "seeing things" and 'that's what my problem is. Seeing things that aren't there.' Grooming: Wash/dry face;Sitting;Set up;Cueing for sequencing Grooming Details (indicate cue type and reason): Increased time Upper Body Bathing: Minimal assistance;Moderate assistance;Sitting   Lower Body Bathing: Maximal assistance;Total assistance;+2 for physical assistance;+2 for safety/equipment;Bed level Lower Body Bathing Details (indicate cue type and reason): Per general assessment. Upper Body Dressing : Maximal assistance;Sitting;Cueing for sequencing;Cueing for safety Upper Body Dressing Details (indicate cue type and reason): Increased time to chage front and back gowns. Pt raising arms minimally to assist with threading despite cues. Lower Body Dressing: Total assistance;+2 for physical assistance;+2 for safety/equipment;Sitting/lateral leans Lower Body Dressing Details (indicate cue type and reason): Pt attmpted to reach top of sock to adjust but unable to reach despite increased time.cues.   Toilet Transfer Details (indicate cue type and reason): Pt stood from recliner to RW x 2 rep with Max As of 2 people needed for up and down due to rigidity and retropulsion.  No steps but pt  able to march in place with cues and increased time. Toileting- Clothing Manipulation and Hygiene: Total assistance Toileting - Clothing Manipulation Details (indicate cue type and reason): Ext catheter     Functional mobility during ADLs: Maximal assistance;+2 for physical assistance;Cueing for sequencing;Cueing for safety;Rolling walker (2 wheels)       Vision Ability to See in Adequate Light: 3 Highly impaired Additional Comments: Difficulty finding objects on tray.  Needs direct light and good simple contrast.  Pt endorsed "Seeing things that aren't there."     Perception         Praxis Praxis: Impaired Praxis Impairment Details: Initiation, Motor planning      Pertinent Vitals/Pain Pain Assessment Pain Assessment: No/denies pain     Extremity/Trunk Assessment Upper Extremity Assessment Upper Extremity Assessment: Right hand dominant;RUE deficits/detail;LUE deficits/detail RUE Deficits / Details: AROM shoulder ~70-75 degrees. PROM: ~110 degrees.  MMT: WFL within this range. elbow ~3/5 to 3+/5, wrist and hand generalized weakness. Pt not using his RUE for a while while feeding and required multi-modal cues to involve his dominent hand in feeding. Pt then able to hold fruit cup in LT hand and feed self with RT. LUE Deficits / Details: AROM shoulder ~70-75 degrees. PROM: ~110 degrees. MMT: WFL within this range. elbow ~3/5 to 3+/5, wrist and hand generalized weakness. LUE Coordination: decreased fine motor           Communication Communication Communication: Impaired Factors Affecting Communication: Other (comment) (Speech is slow and soft.)   Cognition Arousal: Alert Behavior During Therapy: Flat affect, WFL for tasks assessed/performed Cognition: No family/caregiver present to determine baseline             OT - Cognition Comments: Slow to respond. Self corrected year and Ox4.  Follows instructions with significantly increased time needd for processing and initiation. Parkinson's symptoms seem exacerbated in relation to chart information on pt's prior level of function being mostly Independent.                 Following commands: Impaired Following commands impaired:  (Needs increased time)     Cueing  General Comments   Cueing  Techniques: Verbal cues;Gestural cues;Tactile cues;Visual cues      Exercises     Shoulder Instructions      Home Living Family/patient expects to be discharged to:: Private residence Living Arrangements: Spouse/significant other;Children (Spouse and adult son) Available Help at Discharge: Available PRN/intermittently Type of Home: House Home Access: Stairs to enter Secretary/administrator  of Steps: 3 Entrance Stairs-Rails: Right;Left;Can reach both Home Layout: Two level;Bed/bath upstairs Alternate Level Stairs-Number of Steps: 13 Alternate Level Stairs-Rails: Right Bathroom Shower/Tub: Producer, television/film/video: Standard     Home Equipment: Agricultural consultant (2 wheels);Cane - single point   Additional Comments: Active with outpatient therapy.  Per chart: "Per patient's wife and son, He is typically pretty independent,"      Prior Functioning/Environment Prior Level of Function : Independent/Modified Independent;History of Falls (last six months)             Mobility Comments: Pt reports using RW and SPC in the home and community. Pt reports 1 fall in the last 6 months. ADLs Comments: Pt reports that he performs his own ADLs and cooks somewhat. Family driving.    OT Problem List: Decreased coordination;Impaired vision/perception;Cardiopulmonary status limiting activity;Impaired balance (sitting and/or standing);Decreased knowledge of precautions;Decreased activity tolerance;Decreased knowledge of use of DME or AE;Impaired UE functional use;Decreased range of motion;Decreased safety awareness;Obesity;Impaired tone;Decreased cognition;Decreased strength   OT Treatment/Interventions: Self-care/ADL training;Therapeutic activities;Cognitive remediation/compensation;Therapeutic exercise;Neuromuscular education;Patient/family education;Visual/perceptual remediation/compensation;DME and/or AE instruction;Balance training;Manual therapy      OT Goals(Current goals can be found in the care plan section)   Acute Rehab OT Goals OT Goal Formulation: Patient unable to participate in goal setting Time For Goal Achievement: 01/01/24 Potential to Achieve Goals: Good ADL Goals Pt Will Perform Eating: with min assist;sitting;with adaptive utensils;with caregiver independent in assisting Pt Will Perform Grooming: with set-up;with supervision (Least restrictive position  possible) Pt Will Perform Upper Body Dressing: with supervision;with set-up;sitting Pt Will Transfer to Toilet: ambulating;bedside commode;with contact guard assist Pt/caregiver will Perform Home Exercise Program: Increased ROM;Increased strength;Both right and left upper extremity (Increqased flexibility to inhibit rigidity)   OT Frequency:  Min 1X/week    Co-evaluation              AM-PAC OT "6 Clicks" Daily Activity     Outcome Measure Help from another person eating meals?: A Lot Help from another person taking care of personal grooming?: A Little Help from another person toileting, which includes using toliet, bedpan, or urinal?: Total Help from another person bathing (including washing, rinsing, drying)?: A Lot Help from another person to put on and taking off regular upper body clothing?: A Lot Help from another person to put on and taking off regular lower body clothing?: Total 6 Click Score: 11   End of Session Equipment Utilized During Treatment: Gait belt;Rolling walker (2 wheels) Nurse Communication: Other (comment) (Okay to see)  Activity Tolerance: Patient limited by fatigue Patient left: in chair;with call bell/phone within reach  OT Visit Diagnosis: Unsteadiness on feet (R26.81);Feeding difficulties (R63.3);Other abnormalities of gait and mobility (R26.89);Muscle weakness (generalized) (M62.81);History of falling (Z91.81);Ataxia, unspecified (R27.0);Other symptoms and signs involving the nervous system (R29.898);Low vision, both eyes (H54.2)                Time: 5621-3086 OT Time Calculation (min): 38 min Charges:  OT General Charges $OT Visit: 1 Visit OT Evaluation $OT Eval Moderate Complexity: 1 Mod  Edward Briggs, OT Acute Rehab Services Office: [phone] 12/18/2023   Edward Briggs 12/18/2023, 11:31 AM

## 2023-12-19 DIAGNOSIS — J101 Influenza due to other identified influenza virus with other respiratory manifestations: Secondary | ICD-10-CM | POA: Diagnosis not present

## 2023-12-19 LAB — COMPREHENSIVE METABOLIC PANEL
ALT: 8 U/L (ref 0–44)
AST: 83 U/L — ABNORMAL HIGH (ref 15–41)
Albumin: 3 g/dL — ABNORMAL LOW (ref 3.5–5.0)
Alkaline Phosphatase: 40 U/L (ref 38–126)
Anion gap: 9 (ref 5–15)
BUN: 14 mg/dL (ref 8–23)
CO2: 24 mmol/L (ref 22–32)
Calcium: 8.3 mg/dL — ABNORMAL LOW (ref 8.9–10.3)
Chloride: 103 mmol/L (ref 98–111)
Creatinine, Ser: 0.76 mg/dL (ref 0.61–1.24)
GFR, Estimated: >60 mL/min (ref >60)
Glucose, Bld: 106 mg/dL — ABNORMAL HIGH (ref 70–99)
Potassium: 3.7 mmol/L (ref 3.5–5.1)
Sodium: 136 mmol/L (ref 135–145)
Total Bilirubin: 0.6 mg/dL (ref 0.0–1.2)
Total Protein: 6.6 g/dL (ref 6.5–8.1)

## 2023-12-19 LAB — GLUCOSE, CAPILLARY
Glucose-Capillary: 105 mg/dL — ABNORMAL HIGH (ref 70–99)
Glucose-Capillary: 181 mg/dL — ABNORMAL HIGH (ref 70–99)
Glucose-Capillary: 130 mg/dL — ABNORMAL HIGH (ref 70–99)
Glucose-Capillary: 127 mg/dL — ABNORMAL HIGH (ref 70–99)

## 2023-12-19 LAB — CBC
HCT: 34.3 % — ABNORMAL LOW (ref 39.0–52.0)
Hemoglobin: 11.4 g/dL — ABNORMAL LOW (ref 13.0–17.0)
MCH: 31.5 pg (ref 26.0–34.0)
MCHC: 33.2 g/dL (ref 30.0–36.0)
MCV: 94.8 fL (ref 80.0–100.0)
Platelets: 152 10*3/uL (ref 150–400)
RBC: 3.62 MIL/uL — ABNORMAL LOW (ref 4.22–5.81)
RDW: 11.9 % (ref 11.5–15.5)
WBC: 5.7 10*3/uL (ref 4.0–10.5)
nRBC: 0 % (ref 0.0–0.2)

## 2023-12-19 LAB — MAGNESIUM: Magnesium: 1.7 mg/dL (ref 1.7–2.4)

## 2023-12-19 MED ORDER — PHENOL 1.4 % MT LIQD
1.0000 | OROMUCOSAL | Status: DC | PRN
  Administered 2023-12-19: 1 via OROMUCOSAL
  Filled 2023-12-19: qty 177

## 2023-12-19 NOTE — Plan of Care (Signed)

## 2023-12-19 NOTE — Progress Notes (Signed)
PROGRESS NOTE  MICHEIL KLAUS ZOX:[id number] DOB: 11/17/1950 DOA: 12/17/2023 PCP: [name], MD   LOS: 1 day   Brief Narrative / Interim history: 73 year old male with IDDM, HTN, Parkinson's disease, confusion comes into the hospital with profound weakness, increased cough for the past 2 to 3 days.  Wife and son have also had URI over the same amount of time.  He has been progressively weak, unable to ambulate on his own, with fevers.  He tested positive for influenza A and he was admitted to the hospital.  Subjective / 24h Interval events: Quite sleepy this morning, still febrile today to 100.  Remains weak.  Worked with therapy yesterday was unable to walk.  Denies any shortness of breath this morning, no chest discomfort  Assesement and Plan: Principal problem Influenza A infection-with significant weakness, PT evaluated yesterday, does not look like he was able to walk.  -Wife feels like he is a little bit better.  Continue Tamiflu and supportive care  Active problems Acute metabolic encephalopathy on underlying Parkinson with dementia-continue home medications, likely confusion is increased in the setting of acute infection  Hypertension-continue lisinopril, blood pressure overall acceptable  Hyperlipidemia-continue statin  Obesity, class I-he would benefit from weight loss.  BMI 31  IDDM-continue Lantus, sliding scale.  Hold metformin.  A1c acceptable in his age group  Lab Results  Component Value Date   HGBA1C 7.8 (H) 09/18/2023   CBG (last 3)  Recent Labs    12/18/23 2035 12/19/23 0741 12/19/23 1115  GLUCAP 137* 105* 181*    Scheduled Meds:  aspirin EC  81 mg Oral Daily   atorvastatin  10 mg Oral Daily   carbidopa-levodopa  1 tablet Oral QID   donepezil  10 mg Oral QHS   enoxaparin (LOVENOX) injection  40 mg Subcutaneous Q24H   insulin aspart  0-15 Units Subcutaneous TID WC   insulin aspart  0-5 Units Subcutaneous QHS   insulin glargine-yfgn  15 Units  Subcutaneous QHS   lisinopril  5 mg Oral Daily   oseltamivir  75 mg Oral BID   Continuous Infusions: PRN Meds:.acetaminophen **OR** acetaminophen, albuterol, ondansetron **OR** ondansetron (ZOFRAN) IV, phenol, traZODone  Current Outpatient Medications  Medication Instructions   Accu-Chek Softclix Lancets lancets USE AS DIRECTED UP TO 4 TIMES DAILY   aspirin 81 mg, Every 7 days   atorvastatin (LIPITOR) 10 mg, Oral, Daily   blood glucose meter kit and supplies Dispense based on patient and insurance preference. Use up to four times daily as directed. (FOR ICD-10 E10.9, E11.9).   Blood Glucose Monitoring Suppl (BLOOD GLUCOSE METER) kit Use as instructed   carbidopa-levodopa (SINEMET IR) 25-100 MG tablet 1 tablet, Oral, 4 times daily   cephALEXin (KEFLEX) 500 mg, Oral, 3 times daily   donepezil (ARICEPT) 10 mg, Oral, Daily at bedtime   glucose blood test strip Check blood sugar 3 times daily   Insulin Pen Needle (BD PEN NEEDLE NANO U/F) 32G X 4 MM MISC USE DAILY AS DIRECTED   LANTUS SOLOSTAR 100 UNIT/ML Solostar Pen INJECT 34 UNITS SUBCUTANEOUSLY ONCE DAILY   lisinopril (ZESTRIL) 5 mg, Oral, Daily   Metformin HCl 500 MG/5ML SOLN TAKE 10 ML BY MOUTH  TWICE DAILY   neomycin-polymyxin-hydrocortisone (CORTISPORIN) 3.5-10000-1 OTIC suspension Apply 1-2 drops daily after soaking and cover with bandaid   TYLENOL 500-1,000 mg, Oral, Every 6 hours PRN    Diet Orders (From admission, onward)     Start     Ordered  12/17/23 1617  Diet Carb Modified Fluid consistency: Thin; Room service appropriate? Yes  Diet effective now       Question Answer Comment  Diet-HS Snack? Nothing   Calorie Level Medium 1600-2000   Fluid consistency: Thin   Room service appropriate? Yes      12/17/23 1618            DVT prophylaxis: enoxaparin (LOVENOX) injection 40 mg Start: 12/17/23 2200 SCDs Start: 12/17/23 1617   Lab Results  Component Value Date   PLT 152 12/19/2023      Code Status: Full  Code  Family Communication: Wife at bedside  Status is: Inpatient   Level of care: Med-Surg  Consultants:  None  Objective: Vitals:   12/18/23 1226 12/18/23 1934 12/19/23 0536 12/19/23 1112  BP: (!) 90/58 (!) 148/88 (!) 148/83 119/63  Pulse: 87 80 70 78  Resp: 18 18 20 20   Temp: 98.7 F (37.1 C) 99.6 F (37.6 C) 99.5 F (37.5 C) 100.1 F (37.8 C)  TempSrc:      SpO2: 99% 100% 100% 97%  Weight:      Height:        Intake/Output Summary (Last 24 hours) at 12/19/2023 1129 Last data filed at 12/19/2023 0915 Gross per 24 hour  Intake 1080 ml  Output 800 ml  Net 280 ml   Wt Readings from Last 3 Encounters:  12/17/23 106.6 kg  09/18/23 111.8 kg  06/18/23 111.8 kg    Examination:  Constitutional: NAD Eyes: lids and conjunctivae normal, no scleral icterus ENMT: mmm Neck: normal, supple Respiratory: clear to auscultation bilaterally, no wheezing, no crackles. Normal respiratory effort.  Cardiovascular: Regular rate and rhythm, no murmurs / rubs / gallops. No LE edema. Abdomen: soft, no distention, no tenderness. Bowel sounds positive.   Data Reviewed: I have independently reviewed following labs and imaging studies   CBC Recent Labs  Lab 12/17/23 1313 12/18/23 0438 12/19/23 0448  WBC 6.6 6.6 5.7  HGB 12.9* 12.3* 11.4*  HCT 38.9* 38.2* 34.3*  PLT 185 153 152  MCV 94.0 98.5 94.8  MCH 31.2 31.7 31.5  MCHC 33.2 32.2 33.2  RDW 12.1 12.1 11.9  LYMPHSABS 1.2  --   --   MONOABS 0.5  --   --   EOSABS 0.0  --   --   BASOSABS 0.0  --   --     Recent Labs  Lab 12/17/23 1310 12/17/23 1313 12/17/23 1339 12/18/23 0438 12/19/23 0158  NA  --  136  --  138 136  K  --  4.2  --  3.8 3.7  CL  --  103  --  105 103  CO2  --  21*  --  20* 24  GLUCOSE  --  147*  --  107* 106*  BUN  --  22  --  15 14  CREATININE  --  1.02  --  0.63 0.76  CALCIUM  --  9.0  --  8.6* 8.3*  AST  --  93*  --   --  83*  ALT  --  27  --   --  8  ALKPHOS  --  47  --   --  40  BILITOT  --   0.8  --   --  0.6  ALBUMIN  --  3.9  --   --  3.0*  MG  --   --   --   --  1.7  LATICACIDVEN 1.7  --   --   --   --  INR  --   --  1.2  --   --     ------------------------------------------------------------------------------------------------------------------ No results for input(s): "CHOL", "HDL", "LDLCALC", "TRIG", "CHOLHDL", "LDLDIRECT" in the last 72 hours.  Lab Results  Component Value Date   HGBA1C 7.8 (H) 09/18/2023   ------------------------------------------------------------------------------------------------------------------ No results for input(s): "TSH", "T4TOTAL", "T3FREE", "THYROIDAB" in the last 72 hours.  Invalid input(s): "FREET3"  Cardiac Enzymes No results for input(s): "CKMB", "TROPONINI", "MYOGLOBIN" in the last 168 hours.  Invalid input(s): "CK" ------------------------------------------------------------------------------------------------------------------ No results found for: "BNP"  CBG: Recent Labs  Lab 12/18/23 1204 12/18/23 1653 12/18/23 2035 12/19/23 0741 12/19/23 1115  GLUCAP 153* 123* 137* 105* 181*    Recent Results (from the past 240 hours)  Resp panel by RT-PCR (RSV, Flu A&B, Covid) Anterior Nasal Swab     Status: Abnormal   Collection Time: 12/17/23  1:13 PM   Specimen: Anterior Nasal Swab  Result Value Ref Range Status   SARS Coronavirus 2 by RT PCR NEGATIVE NEGATIVE Final    Comment: (NOTE) SARS-CoV-2 target nucleic acids are NOT DETECTED.  The SARS-CoV-2 RNA is generally detectable in upper respiratory specimens during the acute phase of infection. The lowest concentration of SARS-CoV-2 viral copies this assay can detect is 138 copies/mL. A negative result does not preclude SARS-Cov-2 infection and should not be used as the sole basis for treatment or other patient management decisions. A negative result may occur with  improper specimen collection/handling, submission of specimen other than nasopharyngeal swab,  presence of viral mutation(s) within the areas targeted by this assay, and inadequate number of viral copies(<138 copies/mL). A negative result must be combined with clinical observations, patient history, and epidemiological information. The expected result is Negative.  Fact Sheet for Patients:  BloggerCourse.com  Fact Sheet for Healthcare Providers:  SeriousBroker.it  This test is no t yet approved or cleared by the [place] FDA and  has been authorized for detection and/or diagnosis of SARS-CoV-2 by FDA under an Emergency Use Authorization (EUA). This EUA will remain  in effect (meaning this test can be used) for the duration of the COVID-19 declaration under Section 564(b)(1) of the Act, 21 U.S.C.section 360bbb-3(b)(1), unless the authorization is terminated  or revoked sooner.       Influenza A by PCR POSITIVE (A) NEGATIVE Final   Influenza B by PCR NEGATIVE NEGATIVE Final    Comment: (NOTE) The Xpert Xpress SARS-CoV-2/FLU/RSV plus assay is intended as an aid in the diagnosis of influenza from Nasopharyngeal swab specimens and should not be used as a sole basis for treatment. Nasal washings and aspirates are unacceptable for Xpert Xpress SARS-CoV-2/FLU/RSV testing.  Fact Sheet for Patients: BloggerCourse.com  Fact Sheet for Healthcare Providers: SeriousBroker.it  This test is not yet approved or cleared by the [place] FDA and has been authorized for detection and/or diagnosis of SARS-CoV-2 by FDA under an Emergency Use Authorization (EUA). This EUA will remain in effect (meaning this test can be used) for the duration of the COVID-19 declaration under Section 564(b)(1) of the Act, 21 U.S.C. section 360bbb-3(b)(1), unless the authorization is terminated or revoked.     Resp Syncytial Virus by PCR NEGATIVE NEGATIVE Final    Comment: (NOTE) Fact Sheet for  Patients: BloggerCourse.com  Fact Sheet for Healthcare Providers: SeriousBroker.it  This test is not yet approved or cleared by the [place] FDA and has been authorized for detection and/or diagnosis of SARS-CoV-2 by FDA under an Emergency Use Authorization (EUA). This EUA will remain in  effect (meaning this test can be used) for the duration of the COVID-19 declaration under Section 564(b)(1) of the Act, 21 U.S.C. section 360bbb-3(b)(1), unless the authorization is terminated or revoked.  Performed at [facility], [address] [place], [place] [address]   Blood Culture (routine x 2)     Status: None (Preliminary result)   Collection Time: 12/17/23  1:35 PM   Specimen: BLOOD  Result Value Ref Range Status   Specimen Description   Final    BLOOD RIGHT ANTECUBITAL Performed at [facility], [address] [place], [place] [address]    Special Requests   Final    BOTTLES DRAWN AEROBIC AND ANAEROBIC Blood Culture results may not be optimal due to an inadequate volume of blood received in culture bottles Performed at [facility], [address] [place], [place] [address]    Culture   Final    NO GROWTH 2 DAYS Performed at [facility], [address] [place], [place] [address]    Report Status PENDING  Incomplete  Blood Culture (routine x 2)     Status: None (Preliminary result)   Collection Time: 12/17/23  1:39 PM   Specimen: BLOOD  Result Value Ref Range Status   Specimen Description   Final    BLOOD LEFT ANTECUBITAL Performed at [facility], [address] [place], [place] [address]    Special Requests   Final    BOTTLES DRAWN AEROBIC AND ANAEROBIC Blood Culture results may not be optimal due to an inadequate volume of blood received in culture bottles Performed at [facility], [address], [place],  [place] [address]    Culture   Final    NO GROWTH 2 DAYS Performed at [facility], [address] [place], [place] [address]    Report Status PENDING  Incomplete     Radiology Studies: No results found.    Pamella Pert, MD, PhD [facility]  Between 7 am - 7 pm I am available, please contact me via Amion (for emergencies) or Securechat (non urgent messages)  Between 7 pm - 7 am I am not available, please contact night coverage MD/APP via Amion

## 2023-12-20 DIAGNOSIS — J101 Influenza due to other identified influenza virus with other respiratory manifestations: Secondary | ICD-10-CM | POA: Diagnosis not present

## 2023-12-20 LAB — GLUCOSE, CAPILLARY
Glucose-Capillary: 111 mg/dL — ABNORMAL HIGH (ref 70–99)
Glucose-Capillary: 180 mg/dL — ABNORMAL HIGH (ref 70–99)
Glucose-Capillary: 180 mg/dL — ABNORMAL HIGH (ref 70–99)
Glucose-Capillary: 161 mg/dL — ABNORMAL HIGH (ref 70–99)

## 2023-12-20 NOTE — Plan of Care (Signed)

## 2023-12-20 NOTE — Plan of Care (Signed)

## 2023-12-20 NOTE — TOC Initial Note (Signed)
Transition of Care [facility]) - Initial/Assessment Note    Patient Details  Name: Edward Briggs MRN: [id number] Date of Birth: 11/03/1951  [facility]) CM/SW Contact:    Otelia Santee, LCSW Phone Number: 12/20/2023, 11:17 AM  Clinical Narrative:                 Met with pt and spouse at bedside to discuss recommendation for SNF placement. Pt's spouse shares pt has not been to SNF in the past. She is agreeable to SNF placement and does not have current preference. Referrals have been faxed out and currently awaiting bed offers.   Expected Discharge Plan: Skilled Nursing Facility Barriers to Discharge: Continued Medical Work up, SNF Pending bed offer   Patient Goals and CMS Choice Patient states their goals for this hospitalization and ongoing recovery are:: For pt to get better          Expected Discharge Plan and Services In-house Referral: Clinical Social Work Discharge Planning Services: NA Post Acute Care Choice: Skilled Nursing Facility Living arrangements for the past 2 months: Single Family Home                 DME Arranged: N/A DME Agency: NA                  Prior Living Arrangements/Services Living arrangements for the past 2 months: Single Family Home Lives with:: Spouse, Adult Children Patient language and need for interpreter reviewed:: Yes Do you feel safe going back to the place where you live?: Yes      Need for Family Participation in Patient Care: Yes (Comment) Care giver support system in place?: Yes (comment) Current home services: DME Gilmer Mor, RW) Criminal Activity/Legal Involvement Pertinent to Current Situation/Hospitalization: No - Comment as needed  Activities of Daily Living   ADL Screening (condition at time of admission) Independently performs ADLs?: No Does the patient have a NEW difficulty with bathing/dressing/toileting/self-feeding that is expected to last >3 days?: Yes (Initiates electronic notice to provider for  possible OT consult) Does the patient have a NEW difficulty with getting in/out of bed, walking, or climbing stairs that is expected to last >3 days?: Yes (Initiates electronic notice to provider for possible PT consult) Does the patient have a NEW difficulty with communication that is expected to last >3 days?: No Is the patient deaf or have difficulty hearing?: No Does the patient have difficulty seeing, even when wearing glasses/contacts?: No Does the patient have difficulty concentrating, remembering, or making decisions?: Yes  Permission Sought/Granted Permission sought to share information with : Facility Medical sales representative, Family Supports Permission granted to share information with : Yes, Verbal Permission Granted  Share Information with NAME: Edward Briggs  Permission granted to share info w AGENCY: SNF's  Permission granted to share info w Relationship: Spouse     Emotional Assessment Appearance:: Appears stated age Attitude/Demeanor/Rapport: Unable to Assess Affect (typically observed): Unable to Assess Orientation: : Oriented to Self, Oriented to Place Alcohol / Substance Use: Not Applicable Psych Involvement: No (comment)  Admission diagnosis:  Influenza A [J10.1] Patient Active Problem List   Diagnosis Date Noted   Influenza A 12/17/2023   HTN (hypertension) 07/22/2019   Acute metabolic encephalopathy 07/22/2019   AKI (acute kidney injury) (HCC) 07/22/2019   Sepsis (HCC) 07/22/2019   UTI (urinary tract infection) 07/22/2019   Diabetes mellitus without complication (HCC) 09/10/2012   CHEST PAIN 07/21/2009   PCP:  [name], MD Pharmacy:   [id number] [place]), [place] - [address] [place]) [place] [address] Phone: [phone] Fax: [phone]     Social Drivers of Health (SDOH) Social History: SDOH Screenings   Food Insecurity: No Food Insecurity (12/17/2023)  Housing: Low Risk  (12/17/2023)   Transportation Needs: No Transportation Needs (12/17/2023)  Utilities: Not At Risk (12/17/2023)  Alcohol Screen: Low Risk  (03/25/2023)  Depression (PHQ2-9): Low Risk  (09/18/2023)  Financial Resource Strain: Low Risk  (03/25/2023)  Physical Activity: Inactive (03/25/2023)  Social Connections: Socially Integrated (12/17/2023)  Stress: No Stress Concern Present (03/25/2023)  Tobacco Use: Medium Risk (12/17/2023)   SDOH Interventions:     Readmission Risk Interventions    12/20/2023   11:15 AM  Readmission Risk Prevention Plan  Post Dischage Appt Complete  Medication Screening Complete  Transportation Screening Complete

## 2023-12-20 NOTE — Progress Notes (Signed)
PROGRESS NOTE  Edward Briggs:[id number] DOB: 1951/02/27 DOA: 12/17/2023 PCP: [name], MD   LOS: 2 days   Brief Narrative / Interim history: 73 year old male with IDDM, HTN, Parkinson's disease, confusion comes into the hospital with profound weakness, increased cough for the past 2 to 3 days.  Wife and son have also had URI over the same amount of time.  He has been progressively weak, unable to ambulate on his own, with fevers.  He tested positive for influenza A and he was admitted to the hospital.  Subjective / 24h Interval events: Feeling well, no specific complaints.  Ate breakfast without difficulties.  Quite weak, PT recommended SNF  Assesement and Plan: Principal problem Influenza A infection-with significant weakness, PT evaluated and recommending SNF -Wife feels like he is a little bit better.  Continue Tamiflu and supportive care -PT reevaluation tomorrow.  Fever curve improved.  TOC consulted for SNF  Active problems Acute metabolic encephalopathy on underlying Parkinson with dementia-continue home medications, likely confusion is increased in the setting of acute infection -Hard to say if he is improving, at baseline he has "good days and bad days"  Hypertension-continue lisinopril, blood pressure overall acceptable  Hyperlipidemia-continue statin  Obesity, class I-he would benefit from weight loss.  BMI 31  IDDM-continue Lantus, sliding scale.  Hold metformin.  A1c acceptable in his age group  Lab Results  Component Value Date   HGBA1C 7.8 (H) 09/18/2023   CBG (last 3)  Recent Labs    12/19/23 1652 12/19/23 2104 12/20/23 0716  GLUCAP 130* 127* 111*    Scheduled Meds:  aspirin EC  81 mg Oral Daily   atorvastatin  10 mg Oral Daily   carbidopa-levodopa  1 tablet Oral QID   donepezil  10 mg Oral QHS   enoxaparin (LOVENOX) injection  40 mg Subcutaneous Q24H   insulin aspart  0-15 Units Subcutaneous TID WC   insulin aspart  0-5 Units  Subcutaneous QHS   insulin glargine-yfgn  15 Units Subcutaneous QHS   lisinopril  5 mg Oral Daily   oseltamivir  75 mg Oral BID   Continuous Infusions: PRN Meds:.acetaminophen **OR** acetaminophen, albuterol, ondansetron **OR** ondansetron (ZOFRAN) IV, phenol, traZODone  Current Outpatient Medications  Medication Instructions   Accu-Chek Softclix Lancets lancets USE AS DIRECTED UP TO 4 TIMES DAILY   aspirin 81 mg, Every 7 days   atorvastatin (LIPITOR) 10 mg, Oral, Daily   blood glucose meter kit and supplies Dispense based on patient and insurance preference. Use up to four times daily as directed. (FOR ICD-10 E10.9, E11.9).   Blood Glucose Monitoring Suppl (BLOOD GLUCOSE METER) kit Use as instructed   carbidopa-levodopa (SINEMET IR) 25-100 MG tablet 1 tablet, Oral, 4 times daily   cephALEXin (KEFLEX) 500 mg, Oral, 3 times daily   donepezil (ARICEPT) 10 mg, Oral, Daily at bedtime   glucose blood test strip Check blood sugar 3 times daily   Insulin Pen Needle (BD PEN NEEDLE NANO U/F) 32G X 4 MM MISC USE DAILY AS DIRECTED   LANTUS SOLOSTAR 100 UNIT/ML Solostar Pen INJECT 34 UNITS SUBCUTANEOUSLY ONCE DAILY   lisinopril (ZESTRIL) 5 mg, Oral, Daily   Metformin HCl 500 MG/5ML SOLN TAKE 10 ML BY MOUTH  TWICE DAILY   neomycin-polymyxin-hydrocortisone (CORTISPORIN) 3.5-10000-1 OTIC suspension Apply 1-2 drops daily after soaking and cover with bandaid   TYLENOL 500-1,000 mg, Oral, Every 6 hours PRN    Diet Orders (From admission, onward)     Start  Ordered   12/17/23 1617  Diet Carb Modified Fluid consistency: Thin; Room service appropriate? Yes  Diet effective now       Question Answer Comment  Diet-HS Snack? Nothing   Calorie Level Medium 1600-2000   Fluid consistency: Thin   Room service appropriate? Yes      12/17/23 1618            DVT prophylaxis: enoxaparin (LOVENOX) injection 40 mg Start: 12/17/23 2200 SCDs Start: 12/17/23 1617   Lab Results  Component Value Date    PLT 152 12/19/2023      Code Status: Full Code  Family Communication: Wife at bedside  Status is: Inpatient   Level of care: Med-Surg  Consultants:  None  Objective: Vitals:   12/19/23 1200 12/19/23 2056 12/20/23 0322 12/20/23 1131  BP:  132/69 124/70 121/70  Pulse:  74 69 72  Resp:  18 18 18   Temp: 98.6 F (37 C) 98.9 F (37.2 C) 98.5 F (36.9 C) 99.3 F (37.4 C)  TempSrc:  Oral Oral Oral  SpO2:  93% 94% 98%  Weight:      Height:        Intake/Output Summary (Last 24 hours) at 12/20/2023 1143 Last data filed at 12/20/2023 0300 Gross per 24 hour  Intake 720 ml  Output 700 ml  Net 20 ml   Wt Readings from Last 3 Encounters:  12/17/23 106.6 kg  09/18/23 111.8 kg  06/18/23 111.8 kg    Examination:  Constitutional: NAD Eyes: lids and conjunctivae normal, no scleral icterus ENMT: mmm Neck: normal, supple Respiratory: clear to auscultation bilaterally, no wheezing, no crackles. Cardiovascular: Regular rate and rhythm, no murmurs / rubs / gallops. No LE edema. Abdomen: soft, no distention, no tenderness. Bowel sounds positive.   Data Reviewed: I have independently reviewed following labs and imaging studies   CBC Recent Labs  Lab 12/17/23 1313 12/18/23 0438 12/19/23 0448  WBC 6.6 6.6 5.7  HGB 12.9* 12.3* 11.4*  HCT 38.9* 38.2* 34.3*  PLT 185 153 152  MCV 94.0 98.5 94.8  MCH 31.2 31.7 31.5  MCHC 33.2 32.2 33.2  RDW 12.1 12.1 11.9  LYMPHSABS 1.2  --   --   MONOABS 0.5  --   --   EOSABS 0.0  --   --   BASOSABS 0.0  --   --     Recent Labs  Lab 12/17/23 1310 12/17/23 1313 12/17/23 1339 12/18/23 0438 12/19/23 0158  NA  --  136  --  138 136  K  --  4.2  --  3.8 3.7  CL  --  103  --  105 103  CO2  --  21*  --  20* 24  GLUCOSE  --  147*  --  107* 106*  BUN  --  22  --  15 14  CREATININE  --  1.02  --  0.63 0.76  CALCIUM  --  9.0  --  8.6* 8.3*  AST  --  93*  --   --  83*  ALT  --  27  --   --  8  ALKPHOS  --  47  --   --  40  BILITOT  --   0.8  --   --  0.6  ALBUMIN  --  3.9  --   --  3.0*  MG  --   --   --   --  1.7  LATICACIDVEN 1.7  --   --   --   --  INR  --   --  1.2  --   --     ------------------------------------------------------------------------------------------------------------------ No results for input(s): "CHOL", "HDL", "LDLCALC", "TRIG", "CHOLHDL", "LDLDIRECT" in the last 72 hours.  Lab Results  Component Value Date   HGBA1C 7.8 (H) 09/18/2023   ------------------------------------------------------------------------------------------------------------------ No results for input(s): "TSH", "T4TOTAL", "T3FREE", "THYROIDAB" in the last 72 hours.  Invalid input(s): "FREET3"  Cardiac Enzymes No results for input(s): "CKMB", "TROPONINI", "MYOGLOBIN" in the last 168 hours.  Invalid input(s): "CK" ------------------------------------------------------------------------------------------------------------------ No results found for: "BNP"  CBG: Recent Labs  Lab 12/19/23 0741 12/19/23 1115 12/19/23 1652 12/19/23 2104 12/20/23 0716  GLUCAP 105* 181* 130* 127* 111*    Recent Results (from the past 240 hours)  Resp panel by RT-PCR (RSV, Flu A&B, Covid) Anterior Nasal Swab     Status: Abnormal   Collection Time: 12/17/23  1:13 PM   Specimen: Anterior Nasal Swab  Result Value Ref Range Status   SARS Coronavirus 2 by RT PCR NEGATIVE NEGATIVE Final    Comment: (NOTE) SARS-CoV-2 target nucleic acids are NOT DETECTED.  The SARS-CoV-2 RNA is generally detectable in upper respiratory specimens during the acute phase of infection. The lowest concentration of SARS-CoV-2 viral copies this assay can detect is 138 copies/mL. A negative result does not preclude SARS-Cov-2 infection and should not be used as the sole basis for treatment or other patient management decisions. A negative result may occur with  improper specimen collection/handling, submission of specimen other than nasopharyngeal swab,  presence of viral mutation(s) within the areas targeted by this assay, and inadequate number of viral copies(<138 copies/mL). A negative result must be combined with clinical observations, patient history, and epidemiological information. The expected result is Negative.  Fact Sheet for Patients:  BloggerCourse.com  Fact Sheet for Healthcare Providers:  SeriousBroker.it  This test is no t yet approved or cleared by the [place] FDA and  has been authorized for detection and/or diagnosis of SARS-CoV-2 by FDA under an Emergency Use Authorization (EUA). This EUA will remain  in effect (meaning this test can be used) for the duration of the COVID-19 declaration under Section 564(b)(1) of the Act, 21 U.S.C.section 360bbb-3(b)(1), unless the authorization is terminated  or revoked sooner.       Influenza A by PCR POSITIVE (A) NEGATIVE Final   Influenza B by PCR NEGATIVE NEGATIVE Final    Comment: (NOTE) The Xpert Xpress SARS-CoV-2/FLU/RSV plus assay is intended as an aid in the diagnosis of influenza from Nasopharyngeal swab specimens and should not be used as a sole basis for treatment. Nasal washings and aspirates are unacceptable for Xpert Xpress SARS-CoV-2/FLU/RSV testing.  Fact Sheet for Patients: BloggerCourse.com  Fact Sheet for Healthcare Providers: SeriousBroker.it  This test is not yet approved or cleared by the [place] FDA and has been authorized for detection and/or diagnosis of SARS-CoV-2 by FDA under an Emergency Use Authorization (EUA). This EUA will remain in effect (meaning this test can be used) for the duration of the COVID-19 declaration under Section 564(b)(1) of the Act, 21 U.S.C. section 360bbb-3(b)(1), unless the authorization is terminated or revoked.     Resp Syncytial Virus by PCR NEGATIVE NEGATIVE Final    Comment: (NOTE) Fact Sheet for  Patients: BloggerCourse.com  Fact Sheet for Healthcare Providers: SeriousBroker.it  This test is not yet approved or cleared by the [place] FDA and has been authorized for detection and/or diagnosis of SARS-CoV-2 by FDA under an Emergency Use Authorization (EUA). This EUA will remain in  effect (meaning this test can be used) for the duration of the COVID-19 declaration under Section 564(b)(1) of the Act, 21 U.S.C. section 360bbb-3(b)(1), unless the authorization is terminated or revoked.  Performed at [facility], [address], [place], [place] [address]   Blood Culture (routine x 2)     Status: None (Preliminary result)   Collection Time: 12/17/23  1:35 PM   Specimen: BLOOD  Result Value Ref Range Status   Specimen Description   Final    BLOOD RIGHT ANTECUBITAL Performed at [facility], [address], [place], [place] [address]    Special Requests   Final    BOTTLES DRAWN AEROBIC AND ANAEROBIC Blood Culture results may not be optimal due to an inadequate volume of blood received in culture bottles Performed at [facility], [address], [place], [place] [address]    Culture   Final    NO GROWTH 3 DAYS Performed at [facility], [address] [place], [place] [address]    Report Status PENDING  Incomplete  Blood Culture (routine x 2)     Status: None (Preliminary result)   Collection Time: 12/17/23  1:39 PM   Specimen: BLOOD  Result Value Ref Range Status   Specimen Description   Final    BLOOD LEFT ANTECUBITAL Performed at [facility], [address], [place], [place] [address]    Special Requests   Final    BOTTLES DRAWN AEROBIC AND ANAEROBIC Blood Culture results may not be optimal due to an inadequate volume of blood received in culture bottles Performed at [facility], [address], [place],  [place] [address]    Culture   Final    NO GROWTH 3 DAYS Performed at [facility], [address], [place], [place] [address]    Report Status PENDING  Incomplete     Radiology Studies: No results found.    Pamella Pert, MD, PhD [facility]  Between 7 am - 7 pm I am available, please contact me via Amion (for emergencies) or Securechat (non urgent messages)  Between 7 pm - 7 am I am not available, please contact night coverage MD/APP via Amion

## 2023-12-20 NOTE — NC FL2 (Signed)
[place] MEDICAID FL2 LEVEL OF CARE FORM     IDENTIFICATION  Patient Name: Edward Briggs Birthdate: 1950-12-07 Sex: male Admission Date (Current Location): 12/17/2023  [facility] and [place] Number:  Producer, television/film/video and Address:  [facility],  [address] [place], [place] [address]      Provider Number: [id number]  Attending Physician Name and Address:  Leatha Gilding, MD  Relative Name and Phone Number:  Drakes,[name] (Spouse)  [phone]    Current Level of Care: Hospital Recommended Level of Care: Skilled Nursing Facility Prior Approval Number:    Date Approved/Denied:   PASRR Number: [id number]  Discharge Plan: SNF    Current Diagnoses: Patient Active Problem List   Diagnosis Date Noted   Influenza A 12/17/2023   HTN (hypertension) 07/22/2019   Acute metabolic encephalopathy 07/22/2019   AKI (acute kidney injury) (HCC) 07/22/2019   Sepsis (HCC) 07/22/2019   UTI (urinary tract infection) 07/22/2019   Diabetes mellitus without complication (HCC) 09/10/2012   CHEST PAIN 07/21/2009    Orientation RESPIRATION BLADDER Height & Weight     Self, Situation, Place  Normal Incontinent Weight: 235 lb 0.2 oz (106.6 kg) Height:  6\' 1"  (185.4 cm)  BEHAVIORAL SYMPTOMS/MOOD NEUROLOGICAL BOWEL NUTRITION STATUS      Incontinent Diet (Carb modified)  AMBULATORY STATUS COMMUNICATION OF NEEDS Skin   Extensive Assist Verbally Normal                       Personal Care Assistance Level of Assistance  Bathing, Feeding, Dressing Bathing Assistance: Maximum assistance Feeding assistance: Maximum assistance Dressing Assistance: Maximum assistance     Functional Limitations Info  Sight, Hearing, Speech Sight Info: Adequate Hearing Info: Adequate Speech Info: Adequate    SPECIAL CARE FACTORS FREQUENCY  PT (By licensed PT), OT (By licensed OT)     PT Frequency: 5x/wk OT Frequency: 5x/wk            Contractures Contractures Info: Not present     Additional Factors Info  Code Status, Allergies, Isolation Precautions Code Status Info: FULL Allergies Info: No Known Allergies     Isolation Precautions Info: Flu     Current Medications (12/20/2023):  This is the current hospital active medication list Current Facility-Administered Medications  Medication Dose Route Frequency Provider Last Rate Last Admin   acetaminophen (TYLENOL) tablet 650 mg  650 mg Oral Q6H PRN Kirby Crigler, Mir M, MD   650 mg at 12/19/23 2057   Or   acetaminophen (TYLENOL) suppository 650 mg  650 mg Rectal Q6H PRN Kirby Crigler, Mir M, MD       albuterol (PROVENTIL) (2.5 MG/3ML) 0.083% nebulizer solution 2.5 mg  2.5 mg Nebulization Q2H PRN Kirby Crigler, Mir M, MD       aspirin EC tablet 81 mg  81 mg Oral Daily Kirby Crigler, Mir M, MD   81 mg at 12/20/23 0854   atorvastatin (LIPITOR) tablet 10 mg  10 mg Oral Daily Kirby Crigler, Mir M, MD   10 mg at 12/20/23 0854   carbidopa-levodopa (SINEMET IR) 25-100 MG per tablet immediate release 1 tablet  1 tablet Oral QID Kirby Crigler, Mir M, MD   1 tablet at 12/20/23 0853   donepezil (ARICEPT) tablet 10 mg  10 mg Oral QHS Kirby Crigler, Mir M, MD   10 mg at 12/19/23 2057   enoxaparin (LOVENOX) injection 40 mg  40 mg Subcutaneous Q24H Kirby Crigler, Mir M, MD   40 mg at 12/19/23 2058   insulin aspart (  novoLOG) injection 0-15 Units  0-15 Units Subcutaneous TID WC Kirby Crigler, Mir M, MD   2 Units at 12/19/23 1711   insulin aspart (novoLOG) injection 0-5 Units  0-5 Units Subcutaneous QHS Kirby Crigler, Mir M, MD       insulin glargine-yfgn [facility]) injection 15 Units  15 Units Subcutaneous QHS Kirby Crigler, Mir M, MD   15 Units at 12/19/23 2057   lisinopril (ZESTRIL) tablet 5 mg  5 mg Oral Daily Kirby Crigler, Mir M, MD   5 mg at 12/20/23 0854   ondansetron (ZOFRAN) tablet 4 mg  4 mg Oral Q6H PRN Kirby Crigler, Mir M, MD       Or   ondansetron [facility]) injection 4 mg  4 mg Intravenous Q6H PRN Kirby Crigler, Mir M, MD       oseltamivir (TAMIFLU) capsule 75  mg  75 mg Oral BID Kirby Crigler, Mir M, MD   75 mg at 12/20/23 0855   phenol (CHLORASEPTIC) mouth spray 1 spray  1 spray Mouth/Throat PRN Anthoney Harada, NP   1 spray at 12/19/23 0622   traZODone (DESYREL) tablet 25 mg  25 mg Oral QHS PRN Maryln Gottron, MD   25 mg at 12/19/23 2058     Discharge Medications: Please see discharge summary for a list of discharge medications.  Relevant Imaging Results:  Relevant Lab Results:   Additional Information SSN: [id number]  Otelia Santee, LCSW

## 2023-12-20 NOTE — Progress Notes (Signed)
Mobility Specialist - Progress Note   12/20/23 1401  Mobility  Activity  (LE Exercises)  Range of Motion/Exercises Active Assistive;Active;Right leg;Left leg  Activity Response Tolerated well  Mobility Referral Yes  Mobility visit 1 Mobility  Mobility Specialist Start Time (ACUTE ONLY) 1348  Mobility Specialist Stop Time (ACUTE ONLY) 1359  Mobility Specialist Time Calculation (min) (ACUTE ONLY) 11 min   Pt received in bed and agreeable to lower extremity exercises. See below for exercises. No complaints during session. Pt to bed after session with all needs met.    Supine BLE exercises: 10 reps each  1) Ankle Pumps  2) Heel Slides   3) Hip Abduction /Adduction   4) Straight Leg Raise      [facility] [place] [facility] Mobility Specialist

## 2023-12-21 ENCOUNTER — Encounter: Payer: Self-pay | Admitting: Occupational Therapy

## 2023-12-21 ENCOUNTER — Ambulatory Visit: Payer: Medicare Other | Admitting: Occupational Therapy

## 2023-12-21 ENCOUNTER — Ambulatory Visit: Payer: Medicare Other

## 2023-12-21 ENCOUNTER — Encounter: Payer: Self-pay | Admitting: Physical Therapy

## 2023-12-21 ENCOUNTER — Ambulatory Visit: Payer: Medicare Other | Admitting: Physical Therapy

## 2023-12-21 DIAGNOSIS — J101 Influenza due to other identified influenza virus with other respiratory manifestations: Secondary | ICD-10-CM | POA: Diagnosis not present

## 2023-12-21 LAB — GLUCOSE, CAPILLARY
Glucose-Capillary: 131 mg/dL — ABNORMAL HIGH (ref 70–99)
Glucose-Capillary: 150 mg/dL — ABNORMAL HIGH (ref 70–99)
Glucose-Capillary: 168 mg/dL — ABNORMAL HIGH (ref 70–99)
Glucose-Capillary: 177 mg/dL — ABNORMAL HIGH (ref 70–99)

## 2023-12-21 LAB — BASIC METABOLIC PANEL
Anion gap: 11 (ref 5–15)
BUN: 11 mg/dL (ref 8–23)
CO2: 20 mmol/L — ABNORMAL LOW (ref 22–32)
Calcium: 8.2 mg/dL — ABNORMAL LOW (ref 8.9–10.3)
Chloride: 103 mmol/L (ref 98–111)
Creatinine, Ser: 0.65 mg/dL (ref 0.61–1.24)
GFR, Estimated: >60 mL/min (ref >60)
Glucose, Bld: 145 mg/dL — ABNORMAL HIGH (ref 70–99)
Potassium: 3.5 mmol/L (ref 3.5–5.1)
Sodium: 134 mmol/L — ABNORMAL LOW (ref 135–145)

## 2023-12-21 LAB — MAGNESIUM: Magnesium: 1.8 mg/dL (ref 1.7–2.4)

## 2023-12-21 NOTE — Plan of Care (Signed)

## 2023-12-21 NOTE — Therapy (Signed)
[facility] [address] [place], [place], [address] Phone: [phone]   Fax:  [phone]  Patient Details  Name: Edward Briggs MRN: [id number] Date of Birth: 16-Jun-1951 Referring Provider:  No ref. provider found  Encounter Date: 12/21/2023  OCCUPATIONAL THERAPY DISCHARGE SUMMARY  Visits from Start of Care: 6  Current functional level related to goals / functional outcomes: Pt had met most STG's however LTG's not met due to unable to finish POC   Remaining deficits: Same as initial evaluation including:  Bradykinesia Strength Significant cognitive deficits   Education / Equipment: HEP's, ADL strategies and safety    Patient goals were partially met. Patient is being discharged due to a change in medical status./being hospitalized on 12/17/23. Being considered for SNF upon d/c from hospital due to significant decline in mobility      Edward Briggs, [place] 12/21/2023, 1:18 PM  [facility] [address] [place], [place], [address] Phone: [phone]   Fax:  [phone]

## 2023-12-21 NOTE — Progress Notes (Signed)
PROGRESS NOTE  Edward Briggs ZOX:[id number] DOB: 1951/02/03 DOA: 12/17/2023 PCP: [name], MD   LOS: 3 days   Brief Narrative / Interim history: 73 year old male with IDDM, HTN, Parkinson's disease, confusion comes into the hospital with profound weakness, increased cough for the past 2 to 3 days.  Wife and son have also had URI over the same amount of time.  He has been progressively weak, unable to ambulate on his own, with fevers.  He tested positive for influenza A and he was admitted to the hospital.  Subjective / 24h Interval events: Feeling well, no nausea, no vomiting, no shortness of breath.  Assesement and Plan: Principal problem Influenza A infection-with significant weakness, PT evaluated and recommending SNF -Wife feels like he is a little bit better.  Continue Tamiflu, supportive care -PT reevaluation tomorrow.  Fever curve improved.  TOC consulted for SNF  Active problems Acute metabolic encephalopathy on underlying Parkinson with dementia-continue home medications, likely confusion is increased in the setting of acute infection -Hard to say if he is improving, at baseline he has "good days and bad days"  Hypertension-continue lisinopril, blood pressure is acceptable  Hyperlipidemia-continue statin  Obesity, class I-he would benefit from weight loss.  BMI 31  IDDM-continue Lantus, sliding scale.  Hold metformin.  A1c acceptable in his age group  Lab Results  Component Value Date   HGBA1C 7.8 (H) 09/18/2023   CBG (last 3)  Recent Labs    12/20/23 1655 12/20/23 2043 12/21/23 0923  GLUCAP 180* 161* 131*    Scheduled Meds:  aspirin EC  81 mg Oral Daily   atorvastatin  10 mg Oral Daily   carbidopa-levodopa  1 tablet Oral QID   donepezil  10 mg Oral QHS   enoxaparin (LOVENOX) injection  40 mg Subcutaneous Q24H   insulin aspart  0-15 Units Subcutaneous TID WC   insulin aspart  0-5 Units Subcutaneous QHS   insulin glargine-yfgn  15 Units  Subcutaneous QHS   lisinopril  5 mg Oral Daily   oseltamivir  75 mg Oral BID   Continuous Infusions: PRN Meds:.acetaminophen **OR** acetaminophen, albuterol, ondansetron **OR** ondansetron (ZOFRAN) IV, phenol, traZODone  Current Outpatient Medications  Medication Instructions   Accu-Chek Softclix Lancets lancets USE AS DIRECTED UP TO 4 TIMES DAILY   aspirin 81 mg, Every 7 days   atorvastatin (LIPITOR) 10 mg, Oral, Daily   blood glucose meter kit and supplies Dispense based on patient and insurance preference. Use up to four times daily as directed. (FOR ICD-10 E10.9, E11.9).   Blood Glucose Monitoring Suppl (BLOOD GLUCOSE METER) kit Use as instructed   carbidopa-levodopa (SINEMET IR) 25-100 MG tablet 1 tablet, Oral, 4 times daily   cephALEXin (KEFLEX) 500 mg, Oral, 3 times daily   donepezil (ARICEPT) 10 mg, Oral, Daily at bedtime   glucose blood test strip Check blood sugar 3 times daily   Insulin Pen Needle (BD PEN NEEDLE NANO U/F) 32G X 4 MM MISC USE DAILY AS DIRECTED   LANTUS SOLOSTAR 100 UNIT/ML Solostar Pen INJECT 34 UNITS SUBCUTANEOUSLY ONCE DAILY   lisinopril (ZESTRIL) 5 mg, Oral, Daily   Metformin HCl 500 MG/5ML SOLN TAKE 10 ML BY MOUTH  TWICE DAILY   neomycin-polymyxin-hydrocortisone (CORTISPORIN) 3.5-10000-1 OTIC suspension Apply 1-2 drops daily after soaking and cover with bandaid   TYLENOL 500-1,000 mg, Oral, Every 6 hours PRN    Diet Orders (From admission, onward)     Start     Ordered   12/17/23 1617  Diet Carb Modified Fluid consistency: Thin; Room service appropriate? Yes  Diet effective now       Question Answer Comment  Diet-HS Snack? Nothing   Calorie Level Medium 1600-2000   Fluid consistency: Thin   Room service appropriate? Yes      12/17/23 1618            DVT prophylaxis: enoxaparin (LOVENOX) injection 40 mg Start: 12/17/23 2200 SCDs Start: 12/17/23 1617   Lab Results  Component Value Date   PLT 152 12/19/2023      Code Status: Full  Code  Family Communication: Wife at bedside  Status is: Inpatient   Level of care: Med-Surg  Consultants:  None  Objective: Vitals:   12/20/23 1131 12/20/23 2045 12/21/23 0422 12/21/23 0900  BP: 121/70 (!) 145/83 (!) 147/76 (!) 142/60  Pulse: 72 77 75 77  Resp: 18 20 18 20   Temp: 99.3 F (37.4 C) 99.3 F (37.4 C) 98.1 F (36.7 C) 98 F (36.7 C)  TempSrc: Oral Oral Oral Oral  SpO2: 98% 96% 96% 98%  Weight:      Height:        Intake/Output Summary (Last 24 hours) at 12/21/2023 1031 Last data filed at 12/21/2023 4098 Gross per 24 hour  Intake 360 ml  Output 1900 ml  Net -1540 ml   Wt Readings from Last 3 Encounters:  12/17/23 106.6 kg  09/18/23 111.8 kg  06/18/23 111.8 kg    Examination:  Constitutional: NAD Eyes: lids and conjunctivae normal, no scleral icterus ENMT: mmm Neck: normal, supple Respiratory: clear to auscultation bilaterally, no wheezing, no crackles. Normal respiratory effort.  Cardiovascular: Regular rate and rhythm, no murmurs / rubs / gallops. No LE edema. Abdomen: soft, no distention, no tenderness. Bowel sounds positive.   Data Reviewed: I have independently reviewed following labs and imaging studies   CBC Recent Labs  Lab 12/17/23 1313 12/18/23 0438 12/19/23 0448  WBC 6.6 6.6 5.7  HGB 12.9* 12.3* 11.4*  HCT 38.9* 38.2* 34.3*  PLT 185 153 152  MCV 94.0 98.5 94.8  MCH 31.2 31.7 31.5  MCHC 33.2 32.2 33.2  RDW 12.1 12.1 11.9  LYMPHSABS 1.2  --   --   MONOABS 0.5  --   --   EOSABS 0.0  --   --   BASOSABS 0.0  --   --     Recent Labs  Lab 12/17/23 1310 12/17/23 1313 12/17/23 1339 12/18/23 0438 12/19/23 0158 12/21/23 0435  NA  --  136  --  138 136 134*  K  --  4.2  --  3.8 3.7 3.5  CL  --  103  --  105 103 103  CO2  --  21*  --  20* 24 20*  GLUCOSE  --  147*  --  107* 106* 145*  BUN  --  22  --  15 14 11   CREATININE  --  1.02  --  0.63 0.76 0.65  CALCIUM  --  9.0  --  8.6* 8.3* 8.2*  AST  --  93*  --   --  83*  --    ALT  --  27  --   --  8  --   ALKPHOS  --  47  --   --  40  --   BILITOT  --  0.8  --   --  0.6  --   ALBUMIN  --  3.9  --   --  3.0*  --  MG  --   --   --   --  1.7 1.8  LATICACIDVEN 1.7  --   --   --   --   --   INR  --   --  1.2  --   --   --     ------------------------------------------------------------------------------------------------------------------ No results for input(s): "CHOL", "HDL", "LDLCALC", "TRIG", "CHOLHDL", "LDLDIRECT" in the last 72 hours.  Lab Results  Component Value Date   HGBA1C 7.8 (H) 09/18/2023   ------------------------------------------------------------------------------------------------------------------ No results for input(s): "TSH", "T4TOTAL", "T3FREE", "THYROIDAB" in the last 72 hours.  Invalid input(s): "FREET3"  Cardiac Enzymes No results for input(s): "CKMB", "TROPONINI", "MYOGLOBIN" in the last 168 hours.  Invalid input(s): "CK" ------------------------------------------------------------------------------------------------------------------ No results found for: "BNP"  CBG: Recent Labs  Lab 12/20/23 0716 12/20/23 1131 12/20/23 1655 12/20/23 2043 12/21/23 0923  GLUCAP 111* 180* 180* 161* 131*    Recent Results (from the past 240 hours)  Resp panel by RT-PCR (RSV, Flu A&B, Covid) Anterior Nasal Swab     Status: Abnormal   Collection Time: 12/17/23  1:13 PM   Specimen: Anterior Nasal Swab  Result Value Ref Range Status   SARS Coronavirus 2 by RT PCR NEGATIVE NEGATIVE Final    Comment: (NOTE) SARS-CoV-2 target nucleic acids are NOT DETECTED.  The SARS-CoV-2 RNA is generally detectable in upper respiratory specimens during the acute phase of infection. The lowest concentration of SARS-CoV-2 viral copies this assay can detect is 138 copies/mL. A negative result does not preclude SARS-Cov-2 infection and should not be used as the sole basis for treatment or other patient management decisions. A negative result may occur  with  improper specimen collection/handling, submission of specimen other than nasopharyngeal swab, presence of viral mutation(s) within the areas targeted by this assay, and inadequate number of viral copies(<138 copies/mL). A negative result must be combined with clinical observations, patient history, and epidemiological information. The expected result is Negative.  Fact Sheet for Patients:  BloggerCourse.com  Fact Sheet for Healthcare Providers:  SeriousBroker.it  This test is no t yet approved or cleared by the [place] FDA and  has been authorized for detection and/or diagnosis of SARS-CoV-2 by FDA under an Emergency Use Authorization (EUA). This EUA will remain  in effect (meaning this test can be used) for the duration of the COVID-19 declaration under Section 564(b)(1) of the Act, 21 U.S.C.section 360bbb-3(b)(1), unless the authorization is terminated  or revoked sooner.       Influenza A by PCR POSITIVE (A) NEGATIVE Final   Influenza B by PCR NEGATIVE NEGATIVE Final    Comment: (NOTE) The Xpert Xpress SARS-CoV-2/FLU/RSV plus assay is intended as an aid in the diagnosis of influenza from Nasopharyngeal swab specimens and should not be used as a sole basis for treatment. Nasal washings and aspirates are unacceptable for Xpert Xpress SARS-CoV-2/FLU/RSV testing.  Fact Sheet for Patients: BloggerCourse.com  Fact Sheet for Healthcare Providers: SeriousBroker.it  This test is not yet approved or cleared by the [place] FDA and has been authorized for detection and/or diagnosis of SARS-CoV-2 by FDA under an Emergency Use Authorization (EUA). This EUA will remain in effect (meaning this test can be used) for the duration of the COVID-19 declaration under Section 564(b)(1) of the Act, 21 U.S.C. section 360bbb-3(b)(1), unless the authorization is terminated  or revoked.     Resp Syncytial Virus by PCR NEGATIVE NEGATIVE Final    Comment: (NOTE) Fact Sheet for Patients: BloggerCourse.com  Fact Sheet for Healthcare Providers: SeriousBroker.it  This test is not yet approved or cleared by the [place] and has been authorized for detection and/or diagnosis of SARS-CoV-2 by FDA under an Emergency Use Authorization (EUA). This EUA will remain in effect (meaning this test can be used) for the duration of the COVID-19 declaration under Section 564(b)(1) of the Act, 21 U.S.C. section 360bbb-3(b)(1), unless the authorization is terminated or revoked.  Performed at [facility], [address], [place], [place] [address]   Blood Culture (routine x 2)     Status: None (Preliminary result)   Collection Time: 12/17/23  1:35 PM   Specimen: BLOOD  Result Value Ref Range Status   Specimen Description   Final    BLOOD RIGHT ANTECUBITAL Performed at [facility], [address], [place], [place] [address]    Special Requests   Final    BOTTLES DRAWN AEROBIC AND ANAEROBIC Blood Culture results may not be optimal due to an inadequate volume of blood received in culture bottles Performed at [facility], [address], [place], [place] [address]    Culture   Final    NO GROWTH 4 DAYS Performed at [facility], [address], [place], [place] [address]    Report Status PENDING  Incomplete  Blood Culture (routine x 2)     Status: None (Preliminary result)   Collection Time: 12/17/23  1:39 PM   Specimen: BLOOD  Result Value Ref Range Status   Specimen Description   Final    BLOOD LEFT ANTECUBITAL Performed at [facility], [address] [place], [place] [address]    Special Requests   Final    BOTTLES DRAWN AEROBIC AND ANAEROBIC Blood Culture results may not be optimal due to an inadequate volume of blood  received in culture bottles Performed at [facility], [address], [place], [place] [address]    Culture   Final    NO GROWTH 4 DAYS Performed at [facility], [address], [place], [place] [address]    Report Status PENDING  Incomplete     Radiology Studies: No results found.    Pamella Pert, MD, PhD [facility]  Between 7 am - 7 pm I am available, please contact me via Amion (for emergencies) or Securechat (non urgent messages)  Between 7 pm - 7 am I am not available, please contact night coverage MD/APP via Amion

## 2023-12-21 NOTE — Therapy (Signed)
[facility] [address] [place], [place], [address] Phone: [phone]   Fax:  [phone]  Patient Details  Name: Edward Briggs MRN: [id number] Date of Birth: [birth year] Referring Provider:  No ref. provider found  Encounter Date: 12/21/2023  PHYSICAL THERAPY DISCHARGE SUMMARY  Visits from Start of Care: 5  Current functional level related to goals / functional outcomes: Pt admitted to hospital due to weakness, currently requiring max A x2 for mobility. Pt is CGA-SBA at baseline w/o AD and was working towards using U-step    Remaining deficits: See hospital notes    Education / Equipment: HEP    Patient agrees to discharge. Patient goals were not met. Patient is being discharged due to a change in medical status.   Jill Alexanders Kashawn Manzano, PT, DPT 12/21/2023, 1:37 PM  [facility] [address] [place], [place], [address] Phone: [phone]   Fax:  [phone]

## 2023-12-21 NOTE — TOC Progression Note (Signed)
Transition of Care [facility]) - Progression Note    Patient Details  Name: Edward Briggs MRN: [id number] Date of Birth: Sep 16, 1951  Transition of [facility]) CM/SW Contact  Larrie Kass, LCSW Phone Number: 12/21/2023, 1:24 PM  Clinical Narrative:     Csw presented bed offers to pt's wife , she is requesting to to review   Expected Discharge Plan: Skilled Nursing Facility Barriers to Discharge: Continued Medical Work up, SNF Pending bed offer  Expected Discharge Plan and Services In-house Referral: Clinical Social Work Discharge Planning Services: NA Post Acute Care Choice: Skilled Nursing Facility Living arrangements for the past 2 months: Single Family Home                 DME Arranged: N/A DME Agency: NA                   Social Determinants of Health (SDOH) Interventions SDOH Screenings   Food Insecurity: No Food Insecurity (12/17/2023)  Housing: Low Risk  (12/17/2023)  Transportation Needs: No Transportation Needs (12/17/2023)  Utilities: Not At Risk (12/17/2023)  Alcohol Screen: Low Risk  (03/25/2023)  Depression (PHQ2-9): Low Risk  (09/18/2023)  Financial Resource Strain: Low Risk  (03/25/2023)  Physical Activity: Inactive (03/25/2023)  Social Connections: Socially Integrated (12/17/2023)  Stress: No Stress Concern Present (03/25/2023)  Tobacco Use: Medium Risk (12/17/2023)    Readmission Risk Interventions    12/20/2023   11:15 AM  Readmission Risk Prevention Plan  Post Dischage Appt Complete  Medication Screening Complete  Transportation Screening Complete

## 2023-12-21 NOTE — Progress Notes (Signed)
Occupational Therapy Treatment Patient Details Name: Edward Briggs MRN: [id number] DOB: 10/29/1951 Today's Date: 12/21/2023   History of present illness Edward Briggs is a 73 y.o. male with medical history significant for insulin-dependent type 2 diabetes, hypertension, Parkinson's disease being admitted to the hospital with weakness in the setting of influenza A infection.   OT comments  Pt seen for skilled OT session. Pt bed level with wife bedside. Pt continues to require 2 + assistance for safe mobility from bed level to EOB with RW for SPT. Pt with poor motor planning, initiation and postural control impacting all BADL's and mobility.  Pt progressing toward goals and will be seen while in Acute setting by OT. Patient will benefit from continued inpatient follow up therapy, <3 hours/day.        If plan is discharge home, recommend the following:  A lot of help with bathing/dressing/bathroom;Supervision due to cognitive status;Assistance with feeding;Two people to help with walking and/or transfers;A lot of help with walking and/or transfers;Assist for transportation;Assistance with cooking/housework;Help with stairs or ramp for entrance               Precautions / Restrictions Precautions Precautions: Fall Precaution/Restrictions Comments: DROPLET Restrictions Weight Bearing Restrictions Per Provider Order: No Other Position/Activity Restrictions: Parkinson's       Mobility Bed Mobility Overal bed mobility: Needs Assistance             General bed mobility comments: max A x 1 mod A x 1 with use of bed features and rails to move to EOB    Transfers Overall transfer level: Needs assistance Equipment used: Rolling walker (2 wheels) Transfers: Sit to/from Stand Sit to Stand: Max assist, +2 physical assistance           General transfer comment: EOB with posterior lean and max support to readjust to midline, then sat EOB with min A briefly prior to sit to stand, 2  attempts with maax tactile and verbal cues with delayed motor planning and carrover of 2 step SPT from bed to recliner     Balance Overall balance assessment: History of Falls, Needs assistance   Sitting balance-Leahy Scale: Fair Sitting balance - Comments: poor EOB fading to fair, recliner sitting with back support. Unable to anteriorly scoot in chair. Postural control: Posterior lean Standing balance support: Bilateral upper extremity supported, During functional activity, Reliant on assistive device for balance Standing balance-Leahy Scale: Poor                             ADL either performed or assessed with clinical judgement   ADL Overall ADL's : Needs assistance/impaired Eating/Feeding: Sitting;Maximal assistance;Moderate assistance                                          Extremity/Trunk Assessment         Cervical / Trunk Assessment Cervical / Trunk Assessment: Kyphotic (lateral head tilt with cues and manual support to maintain midline as well as pillow prop when up in recliner)                     Communication Communication Communication: Impaired   Cognition Arousal: Lethargic Behavior During Therapy: Flat affect, WFL for tasks assessed/performed               OT - Cognition  Comments: delayed processing and motor carryover, limited initiation for basic mobility and ADL's                 Following commands: Impaired Following commands impaired: Follows one step commands with increased time      Cueing   Cueing Techniques: Verbal cues, Gestural cues, Tactile cues, Visual cues               General Comments RA, no SOB, min coughing to clear sputum    Pertinent Vitals/ Pain       Pain Assessment Pain Assessment: No/denies pain   Frequency  Min 1X/week        Progress Toward Goals  OT Goals(current goals can now be found in the care plan section)  Progress towards OT goals: Progressing toward  goals  Acute Rehab OT Goals OT Goal Formulation: With patient Time For Goal Achievement: 01/04/24 Potential to Achieve Goals: Good  Plan         AM-PAC OT "6 Clicks" Daily Activity     Outcome Measure   Help from another person eating meals?: A Little Help from another person taking care of personal grooming?: A Little Help from another person toileting, which includes using toliet, bedpan, or urinal?: Total Help from another person bathing (including washing, rinsing, drying)?: A Lot Help from another person to put on and taking off regular upper body clothing?: A Lot Help from another person to put on and taking off regular lower body clothing?: Total 6 Click Score: 12    End of Session Equipment Utilized During Treatment: Gait belt;Rolling walker (2 wheels)  OT Visit Diagnosis: Unsteadiness on feet (R26.81);Feeding difficulties (R63.3);Other abnormalities of gait and mobility (R26.89);Muscle weakness (generalized) (M62.81);History of falling (Z91.81);Ataxia, unspecified (R27.0);Other symptoms and signs involving the nervous system (R29.898);Low vision, both eyes (H54.2)   Activity Tolerance Patient limited by fatigue;Patient limited by lethargy   Patient Left in chair;with call bell/phone within reach;with chair alarm set   Nurse Communication Mobility status        Time: 1410-1435 OT Time Calculation (min): 25 min  Charges: OT Treatments $Self Care/Home Management : 23-37 mins  Lando Alcalde OT/L Acute Rehabilitation Department  [phone]  12/21/2023, 2:51 PM

## 2023-12-21 NOTE — Therapy (Signed)
[facility] [address] [place], [place], [address] Phone: [phone]   Fax:  [phone]  Patient Details  Name: Edward Briggs MRN: [id number] Date of Birth: 29-Jul-1951 Referring Provider:  [name], MD   Encounter Date: 12/21/2023  SPEECH THERAPY DISCHARGE SUMMARY  Visits from Start of Care: 4  Current functional level related to goals / functional outcomes: Pt admitted to hospital and unable to complete POC. D/C from OP ST   Remaining deficits: See hospital notes    Education / Equipment: Speak Out!   Patient agrees to discharge. Patient goals were not met. Patient is being discharged due to a change in medical status.Gracy Racer, CCC-SLP 12/21/2023, 2:13 PM  [facility] [address] [place], [place], [address] Phone: [phone]   Fax:  [phone]

## 2023-12-22 DIAGNOSIS — J101 Influenza due to other identified influenza virus with other respiratory manifestations: Secondary | ICD-10-CM | POA: Diagnosis not present

## 2023-12-22 LAB — CULTURE, BLOOD (ROUTINE X 2)
Culture: NO GROWTH
Culture: NO GROWTH

## 2023-12-22 LAB — GLUCOSE, CAPILLARY
Glucose-Capillary: 128 mg/dL — ABNORMAL HIGH (ref 70–99)
Glucose-Capillary: 162 mg/dL — ABNORMAL HIGH (ref 70–99)
Glucose-Capillary: 165 mg/dL — ABNORMAL HIGH (ref 70–99)
Glucose-Capillary: 113 mg/dL — ABNORMAL HIGH (ref 70–99)

## 2023-12-22 MED ORDER — GUAIFENESIN-DM 100-10 MG/5ML PO SYRP
5.0000 mL | ORAL_SOLUTION | ORAL | Status: DC | PRN
  Administered 2023-12-22 – 2023-12-23 (×4): 5 mL via ORAL
  Filled 2023-12-22 (×5): qty 5

## 2023-12-22 NOTE — Progress Notes (Signed)
Physical Therapy Treatment Patient Details Name: CHRISTOP HIPPERT MRN: [id number] DOB: [birth year] Today's Date: 12/22/2023   History of Present Illness JUNIE ENGRAM is a 73 y.o. male with medical history significant for insulin-dependent type 2 diabetes, hypertension, Parkinson's disease being admitted to the hospital with weakness in the setting of influenza A infection.    PT Comments  PT - Cognition Comments: AxO x 2 slow/sluggish to respond.  Stated his wife got the Flu fisrt but "not as bad". Assisted OOB to amb was difficult. General bed mobility comments: Pt rigid and slow to move.  Poor initiation.  Required + 2 Max/Total Asisst to transfer tyo EOB.  Stiff.  Posterior lean with poor self correction.  Minimal use of B UE's.  EOB x 3 min required balance correction 4 times.  Slow posterior drift. General transfer comment: Pt required + 2 side by side MAX Assist to rise from elevated bed onto B platform EVA walker.  Bradykinesia.  Rigidity.  Total Assist to postione pt weight upper body shift forward.  Posture poor with flexed hips and knees. General Gait Details: First, started with EVA walker. Therapist to advance EVA walker in order to initiate steppage gait.  Severe posterior lean.  Required + 2 side by side and a third to follow with recliner.  Pt was able to amb 22 feet with EVA walker then advance to RW to remaining 16 feet still with + 2 assist.  Typical Parkinsons Gait requiring VC's to increase step length.  VERY weak.  Distance limited by fatigue.  Pt is strong.  HIGH FALL RISK. Positioned in recliner.  Assisted with hand over hand to hold/drink his coffee.  So weak. Prior to admit, pt was amb with Rollator Indep.  Pt will need ST Rehab at SNF to address mobility and functional decline prior to safely returning home with Spouse.    If plan is discharge home, recommend the following: Two people to help with walking and/or transfers;Two people to help with bathing/dressing/bathroom;Assist  for transportation;Assistance with cooking/housework;Help with stairs or ramp for entrance   Can travel by private vehicle     No  Equipment Recommendations  None recommended by PT    Recommendations for Other Services       Precautions / Restrictions Precautions Precautions: Fall Precaution/Restrictions Comments: DROPLET/FLU Hx Parkinsons Restrictions Weight Bearing Restrictions Per Provider Order: No     Mobility  Bed Mobility Overal bed mobility: Needs Assistance Bed Mobility: Supine to Sit     Supine to sit: Max assist, Total assist, +2 for physical assistance, +2 for safety/equipment     General bed mobility comments: Pt rigid and slow to move.  Poor initiation.  Required + 2 Max/Total Asisst to transfer tyo EOB.  Stiff.  Posterior lean with poor self correction.  Minimal use of B UE's.  EOB x 3 min required balance correction 4 times.  Slow posterior drift.    Transfers Overall transfer level: Needs assistance Equipment used: 2 person hand held assist Transfers: Sit to/from Stand Sit to Stand: Max assist, +2 physical assistance           General transfer comment: Pt required + 2 side by side MAX Assist to rise from elevated bed onto B platform EVA walker.  Bradykinesia.  Rigidity.  Total Assist to postione pt weight upper body shift forward.  Posture poor with flexed hips and knees.    Ambulation/Gait Ambulation/Gait assistance: Max assist, +2 physical assistance, +2 safety/equipment Gait Distance (Feet): 38 Feet  Assistive device: Rolling walker (2 wheels), Bilateral platform walker Gait Pattern/deviations: Step-to pattern, Trunk flexed, Shuffle, Narrow base of support       General Gait Details: First, started with EVA walker. Therapist to advance EVA walker in order to initiate steppage gait.  Severe posterior lean.  Required + 2 side by side and a third to follow with recliner.  Pt was able to amb 22 feet with EVA walker then advance to RW to remaining 16  feet still with + 2 assist.  Typical Parkinsons Gait requiring VC's to increase step length.  VERY weak.  Distance limited by fatigue.  Pt is strong.  HIGH FALL RISK.   Stairs             Wheelchair Mobility     Tilt Bed    Modified Rankin (Stroke Patients Only)       Balance                                            Communication    Cognition Arousal: Alert Behavior During Therapy: Flat affect                           PT - Cognition Comments: AxO x 2 slow/sluggish to respond.  Stated his wife got the Flu fisrt but "not as bad". Following commands: Intact Following commands impaired: Follows one step commands with increased time    Cueing Cueing Techniques: Verbal cues  Exercises      General Comments        Pertinent Vitals/Pain Pain Assessment Pain Assessment: No/denies pain    Home Living                          Prior Function            PT Goals (current goals can now be found in the care plan section) Progress towards PT goals: Progressing toward goals    Frequency    Min 1X/week      PT Plan      Co-evaluation              AM-PAC PT "6 Clicks" Mobility   Outcome Measure  Help needed turning from your back to your side while in a flat bed without using bedrails?: Total Help needed moving from lying on your back to sitting on the side of a flat bed without using bedrails?: Total Help needed moving to and from a bed to a chair (including a wheelchair)?: Total Help needed standing up from a chair using your arms (e.g., wheelchair or bedside chair)?: Total Help needed to walk in hospital room?: Total Help needed climbing 3-5 steps with a railing? : Total 6 Click Score: 6    End of Session Equipment Utilized During Treatment: Gait belt Activity Tolerance: Patient limited by fatigue Patient left: in chair;with call bell/phone within reach Nurse Communication: Mobility status PT Visit  Diagnosis: Muscle weakness (generalized) (M62.81);Other symptoms and signs involving the nervous system (R29.898);Difficulty in walking, not elsewhere classified (R26.2)     Time: 4098-1191 PT Time Calculation (min) (ACUTE ONLY): 25 min  Charges:    $Gait Training: 8-22 mins $Therapeutic Activity: 8-22 mins PT General Charges $$ ACUTE PT VISIT: 1 Visit  Felecia Shelling  PTA Acute  Rehabilitation Services Office M-F          [phone]

## 2023-12-22 NOTE — Progress Notes (Signed)
PROGRESS NOTE  Edward Briggs:[id number] DOB: 20-Dec-1950 DOA: 12/17/2023 PCP: [name], MD   LOS: 4 days   Brief Narrative / Interim history: 73 year old male with IDDM, HTN, Parkinson's disease, confusion comes into the hospital with profound weakness, increased cough for the past 2 to 3 days.  Wife and son have also had URI over the same amount of time.  He has been progressively weak, unable to ambulate on his own, with fevers.  He tested positive for influenza A and he was admitted to the hospital.  Subjective / 24h Interval events: Doing well remains weak, no nausea, no vomiting.  Has occasional cough  Assesement and Plan: Principal problem Influenza A infection-with significant weakness, PT evaluated and recommending SNF -Wife feels like he is a little bit better.  Completed Tamiflu fever resolved -SNF pending, wife needs to be complacent insurance authorization is to be obtained  Active problems Acute metabolic encephalopathy on underlying Parkinson with dementia-continue home medications, likely confusion is increased in the setting of acute infection -Mental status close to baseline  Hypertension-continue lisinopril, blood pressure is acceptable  Hyperlipidemia-continue statin  Obesity, class I-he would benefit from weight loss.  BMI 31  IDDM-continue Lantus, sliding scale.  Hold metformin.  A1c acceptable in his age group  Lab Results  Component Value Date   HGBA1C 7.8 (H) 09/18/2023   CBG (last 3)  Recent Labs    12/21/23 2113 12/22/23 0837 12/22/23 1212  GLUCAP 177* 128* 162*    Scheduled Meds:  aspirin EC  81 mg Oral Daily   atorvastatin  10 mg Oral Daily   carbidopa-levodopa  1 tablet Oral QID   donepezil  10 mg Oral QHS   enoxaparin (LOVENOX) injection  40 mg Subcutaneous Q24H   insulin aspart  0-15 Units Subcutaneous TID WC   insulin aspart  0-5 Units Subcutaneous QHS   insulin glargine-yfgn  15 Units Subcutaneous QHS   lisinopril  5  mg Oral Daily   oseltamivir  75 mg Oral BID   Continuous Infusions: PRN Meds:.acetaminophen **OR** acetaminophen, albuterol, guaiFENesin-dextromethorphan, ondansetron **OR** ondansetron (ZOFRAN) IV, phenol, traZODone  Current Outpatient Medications  Medication Instructions   Accu-Chek Softclix Lancets lancets USE AS DIRECTED UP TO 4 TIMES DAILY   aspirin 81 mg, Every 7 days   atorvastatin (LIPITOR) 10 mg, Oral, Daily   blood glucose meter kit and supplies Dispense based on patient and insurance preference. Use up to four times daily as directed. (FOR ICD-10 E10.9, E11.9).   Blood Glucose Monitoring Suppl (BLOOD GLUCOSE METER) kit Use as instructed   carbidopa-levodopa (SINEMET IR) 25-100 MG tablet 1 tablet, Oral, 4 times daily   cephALEXin (KEFLEX) 500 mg, Oral, 3 times daily   donepezil (ARICEPT) 10 mg, Oral, Daily at bedtime   glucose blood test strip Check blood sugar 3 times daily   Insulin Pen Needle (BD PEN NEEDLE NANO U/F) 32G X 4 MM MISC USE DAILY AS DIRECTED   LANTUS SOLOSTAR 100 UNIT/ML Solostar Pen INJECT 34 UNITS SUBCUTANEOUSLY ONCE DAILY   lisinopril (ZESTRIL) 5 mg, Oral, Daily   Metformin HCl 500 MG/5ML SOLN TAKE 10 ML BY MOUTH  TWICE DAILY   neomycin-polymyxin-hydrocortisone (CORTISPORIN) 3.5-10000-1 OTIC suspension Apply 1-2 drops daily after soaking and cover with bandaid   TYLENOL 500-1,000 mg, Oral, Every 6 hours PRN    Diet Orders (From admission, onward)     Start     Ordered   12/17/23 1617  Diet Carb Modified Fluid consistency: Thin; Room  service appropriate? Yes  Diet effective now       Question Answer Comment  Diet-HS Snack? Nothing   Calorie Level Medium 1600-2000   Fluid consistency: Thin   Room service appropriate? Yes      12/17/23 1618            DVT prophylaxis: enoxaparin (LOVENOX) injection 40 mg Start: 12/17/23 2200 SCDs Start: 12/17/23 1617   Lab Results  Component Value Date   PLT 152 12/19/2023      Code Status: Full  Code  Family Communication: Wife at bedside  Status is: Inpatient   Level of care: Med-Surg  Consultants:  None  Objective: Vitals:   12/21/23 1300 12/21/23 2049 12/22/23 0454 12/22/23 1213  BP: 132/68 115/73 139/89 119/73  Pulse: 75 91 78 82  Resp: 20 20 18 18   Temp: 97.9 F (36.6 C) 99 F (37.2 C) 98.7 F (37.1 C) 98.6 F (37 C)  TempSrc: Oral Oral Oral Oral  SpO2: 98% 98% 98% 97%  Weight:      Height:        Intake/Output Summary (Last 24 hours) at 12/22/2023 1325 Last data filed at 12/21/2023 1800 Gross per 24 hour  Intake --  Output 450 ml  Net -450 ml   Wt Readings from Last 3 Encounters:  12/17/23 106.6 kg  09/18/23 111.8 kg  06/18/23 111.8 kg    Examination:  Constitutional: NAD Eyes: lids and conjunctivae normal, no scleral icterus ENMT: mmm Neck: normal, supple Respiratory: clear to auscultation bilaterally, no wheezing, no crackles.  Cardiovascular: Regular rate and rhythm, no murmurs / rubs / gallops. No LE edema. Abdomen: soft, no distention, no tenderness. Bowel sounds positive.   Data Reviewed: I have independently reviewed following labs and imaging studies   CBC Recent Labs  Lab 12/17/23 1313 12/18/23 0438 12/19/23 0448  WBC 6.6 6.6 5.7  HGB 12.9* 12.3* 11.4*  HCT 38.9* 38.2* 34.3*  PLT 185 153 152  MCV 94.0 98.5 94.8  MCH 31.2 31.7 31.5  MCHC 33.2 32.2 33.2  RDW 12.1 12.1 11.9  LYMPHSABS 1.2  --   --   MONOABS 0.5  --   --   EOSABS 0.0  --   --   BASOSABS 0.0  --   --     Recent Labs  Lab 12/17/23 1310 12/17/23 1313 12/17/23 1339 12/18/23 0438 12/19/23 0158 12/21/23 0435  NA  --  136  --  138 136 134*  K  --  4.2  --  3.8 3.7 3.5  CL  --  103  --  105 103 103  CO2  --  21*  --  20* 24 20*  GLUCOSE  --  147*  --  107* 106* 145*  BUN  --  22  --  15 14 11   CREATININE  --  1.02  --  0.63 0.76 0.65  CALCIUM  --  9.0  --  8.6* 8.3* 8.2*  AST  --  93*  --   --  83*  --   ALT  --  27  --   --  8  --   ALKPHOS  --  47   --   --  40  --   BILITOT  --  0.8  --   --  0.6  --   ALBUMIN  --  3.9  --   --  3.0*  --   MG  --   --   --   --  1.7 1.8  LATICACIDVEN 1.7  --   --   --   --   --   INR  --   --  1.2  --   --   --     ------------------------------------------------------------------------------------------------------------------ No results for input(s): "CHOL", "HDL", "LDLCALC", "TRIG", "CHOLHDL", "LDLDIRECT" in the last 72 hours.  Lab Results  Component Value Date   HGBA1C 7.8 (H) 09/18/2023   ------------------------------------------------------------------------------------------------------------------ No results for input(s): "TSH", "T4TOTAL", "T3FREE", "THYROIDAB" in the last 72 hours.  Invalid input(s): "FREET3"  Cardiac Enzymes No results for input(s): "CKMB", "TROPONINI", "MYOGLOBIN" in the last 168 hours.  Invalid input(s): "CK" ------------------------------------------------------------------------------------------------------------------ No results found for: "BNP"  CBG: Recent Labs  Lab 12/21/23 1138 12/21/23 1713 12/21/23 2113 12/22/23 0837 12/22/23 1212  GLUCAP 150* 168* 177* 128* 162*    Recent Results (from the past 240 hours)  Resp panel by RT-PCR (RSV, Flu A&B, Covid) Anterior Nasal Swab     Status: Abnormal   Collection Time: 12/17/23  1:13 PM   Specimen: Anterior Nasal Swab  Result Value Ref Range Status   SARS Coronavirus 2 by RT PCR NEGATIVE NEGATIVE Final    Comment: (NOTE) SARS-CoV-2 target nucleic acids are NOT DETECTED.  The SARS-CoV-2 RNA is generally detectable in upper respiratory specimens during the acute phase of infection. The lowest concentration of SARS-CoV-2 viral copies this assay can detect is 138 copies/mL. A negative result does not preclude SARS-Cov-2 infection and should not be used as the sole basis for treatment or other patient management decisions. A negative result may occur with  improper specimen collection/handling,  submission of specimen other than nasopharyngeal swab, presence of viral mutation(s) within the areas targeted by this assay, and inadequate number of viral copies(<138 copies/mL). A negative result must be combined with clinical observations, patient history, and epidemiological information. The expected result is Negative.  Fact Sheet for Patients:  BloggerCourse.com  Fact Sheet for Healthcare Providers:  SeriousBroker.it  This test is no t yet approved or cleared by the [place] FDA and  has been authorized for detection and/or diagnosis of SARS-CoV-2 by FDA under an Emergency Use Authorization (EUA). This EUA will remain  in effect (meaning this test can be used) for the duration of the COVID-19 declaration under Section 564(b)(1) of the Act, 21 U.S.C.section 360bbb-3(b)(1), unless the authorization is terminated  or revoked sooner.       Influenza A by PCR POSITIVE (A) NEGATIVE Final   Influenza B by PCR NEGATIVE NEGATIVE Final    Comment: (NOTE) The Xpert Xpress SARS-CoV-2/FLU/RSV plus assay is intended as an aid in the diagnosis of influenza from Nasopharyngeal swab specimens and should not be used as a sole basis for treatment. Nasal washings and aspirates are unacceptable for Xpert Xpress SARS-CoV-2/FLU/RSV testing.  Fact Sheet for Patients: BloggerCourse.com  Fact Sheet for Healthcare Providers: SeriousBroker.it  This test is not yet approved or cleared by the [place] FDA and has been authorized for detection and/or diagnosis of SARS-CoV-2 by FDA under an Emergency Use Authorization (EUA). This EUA will remain in effect (meaning this test can be used) for the duration of the COVID-19 declaration under Section 564(b)(1) of the Act, 21 U.S.C. section 360bbb-3(b)(1), unless the authorization is terminated or revoked.     Resp Syncytial Virus by PCR  NEGATIVE NEGATIVE Final    Comment: (NOTE) Fact Sheet for Patients: BloggerCourse.com  Fact Sheet for Healthcare Providers: SeriousBroker.it  This test is not yet approved or cleared by the [place]  FDA and has been authorized for detection and/or diagnosis of SARS-CoV-2 by FDA under an Emergency Use Authorization (EUA). This EUA will remain in effect (meaning this test can be used) for the duration of the COVID-19 declaration under Section 564(b)(1) of the Act, 21 U.S.C. section 360bbb-3(b)(1), unless the authorization is terminated or revoked.  Performed at [facility], [address] [place], [place] [address]   Blood Culture (routine x 2)     Status: None   Collection Time: 12/17/23  1:35 PM   Specimen: BLOOD  Result Value Ref Range Status   Specimen Description   Final    BLOOD RIGHT ANTECUBITAL Performed at [facility], [address] [place], [place] [address]    Special Requests   Final    BOTTLES DRAWN AEROBIC AND ANAEROBIC Blood Culture results may not be optimal due to an inadequate volume of blood received in culture bottles Performed at [facility], [address] [place], [place] [address]    Culture   Final    NO GROWTH 5 DAYS Performed at [facility], [address] [place], [place] [address]    Report Status 12/22/2023 FINAL  Final  Blood Culture (routine x 2)     Status: None   Collection Time: 12/17/23  1:39 PM   Specimen: BLOOD  Result Value Ref Range Status   Specimen Description   Final    BLOOD LEFT ANTECUBITAL Performed at [facility], [address] [place], [place] [address]    Special Requests   Final    BOTTLES DRAWN AEROBIC AND ANAEROBIC Blood Culture results may not be optimal due to an inadequate volume of blood received in culture bottles Performed at [facility], [address] [place], [place] [address]    Culture   Final    NO GROWTH 5 DAYS Performed at [facility], [address] [place], [place] [address]    Report Status 12/22/2023 FINAL  Final     Radiology Studies: No results found.    Pamella Pert, MD, PhD [facility]  Between 7 am - 7 pm I am available, please contact me via Amion (for emergencies) or Securechat (non urgent messages)  Between 7 pm - 7 am I am not available, please contact night coverage MD/APP via Amion

## 2023-12-22 NOTE — Plan of Care (Signed)

## 2023-12-22 NOTE — TOC Progression Note (Addendum)
Transition of Care [facility]) - Progression Note    Patient Details  Name: Edward Briggs MRN: [id number] Date of Birth: [birth year]  Transition [facility]) CM/SW Contact  Larrie Kass, LCSW Phone Number: 12/22/2023, 1:26 PM  Clinical Narrative:     CSW spoke with pt's spouse , [facility]'s [facility] placement. CSW informed Bjorn Loser , admission with [employer] insurance Berkley Harvey was start today. TOC to follow.   ADDEN 2:30pm Insurance Berkley Harvey is pending. TOC to follow  Expected Discharge Plan: Skilled Nursing Facility Barriers to Discharge: Continued Medical Work up, English as a second language teacher  Expected Discharge Plan and Services In-house Referral: Clinical Social Work Discharge Planning Services: NA Post Acute Care Choice: Skilled Nursing Facility Living arrangements for the past 2 months: Single Family Home                 DME Arranged: N/A DME Agency: NA                   Social Determinants of Health (SDOH) Interventions SDOH Screenings   Food Insecurity: No Food Insecurity (12/17/2023)  Housing: Low Risk  (12/17/2023)  Transportation Needs: No Transportation Needs (12/17/2023)  Utilities: Not At Risk (12/17/2023)  Alcohol Screen: Low Risk  (03/25/2023)  Depression (PHQ2-9): Low Risk  (09/18/2023)  Financial Resource Strain: Low Risk  (03/25/2023)  Physical Activity: Inactive (03/25/2023)  Social Connections: Socially Integrated (12/17/2023)  Stress: No Stress Concern Present (03/25/2023)  Tobacco Use: Medium Risk (12/17/2023)    Readmission Risk Interventions    12/20/2023   11:15 AM  Readmission Risk Prevention Plan  Post Dischage Appt Complete  Medication Screening Complete  Transportation Screening Complete

## 2023-12-23 ENCOUNTER — Ambulatory Visit: Payer: Medicare Other | Admitting: Podiatry

## 2023-12-23 DIAGNOSIS — J101 Influenza due to other identified influenza virus with other respiratory manifestations: Secondary | ICD-10-CM | POA: Diagnosis not present

## 2023-12-23 LAB — GLUCOSE, CAPILLARY
Glucose-Capillary: 103 mg/dL — ABNORMAL HIGH (ref 70–99)
Glucose-Capillary: 142 mg/dL — ABNORMAL HIGH (ref 70–99)
Glucose-Capillary: 138 mg/dL — ABNORMAL HIGH (ref 70–99)
Glucose-Capillary: 131 mg/dL — ABNORMAL HIGH (ref 70–99)

## 2023-12-23 LAB — COMPREHENSIVE METABOLIC PANEL
ALT: 9 U/L (ref 0–44)
AST: 24 U/L (ref 15–41)
Albumin: 2.8 g/dL — ABNORMAL LOW (ref 3.5–5.0)
Alkaline Phosphatase: 35 U/L — ABNORMAL LOW (ref 38–126)
Anion gap: 6 (ref 5–15)
BUN: 11 mg/dL (ref 8–23)
CO2: 24 mmol/L (ref 22–32)
Calcium: 8.2 mg/dL — ABNORMAL LOW (ref 8.9–10.3)
Chloride: 106 mmol/L (ref 98–111)
Creatinine, Ser: 0.67 mg/dL (ref 0.61–1.24)
GFR, Estimated: >60 mL/min (ref >60)
Glucose, Bld: 112 mg/dL — ABNORMAL HIGH (ref 70–99)
Potassium: 3.8 mmol/L (ref 3.5–5.1)
Sodium: 136 mmol/L (ref 135–145)
Total Bilirubin: 1 mg/dL (ref 0.0–1.2)
Total Protein: 6.4 g/dL — ABNORMAL LOW (ref 6.5–8.1)

## 2023-12-23 LAB — CBC
HCT: 33.9 % — ABNORMAL LOW (ref 39.0–52.0)
Hemoglobin: 11.4 g/dL — ABNORMAL LOW (ref 13.0–17.0)
MCH: 31.6 pg (ref 26.0–34.0)
MCHC: 33.6 g/dL (ref 30.0–36.0)
MCV: 93.9 fL (ref 80.0–100.0)
Platelets: 157 10*3/uL (ref 150–400)
RBC: 3.61 MIL/uL — ABNORMAL LOW (ref 4.22–5.81)
RDW: 11.7 % (ref 11.5–15.5)
WBC: 8.9 10*3/uL (ref 4.0–10.5)
nRBC: 0 % (ref 0.0–0.2)

## 2023-12-23 LAB — MAGNESIUM: Magnesium: 1.7 mg/dL (ref 1.7–2.4)

## 2023-12-23 MED ORDER — BENZONATATE 100 MG PO CAPS
200.0000 mg | ORAL_CAPSULE | Freq: Two times a day (BID) | ORAL | Status: DC
  Administered 2023-12-23 – 2023-12-24 (×3): 200 mg via ORAL
  Filled 2023-12-23 (×4): qty 2

## 2023-12-23 NOTE — TOC Progression Note (Signed)
Transition of Care [facility]) - Progression Note    Patient Details  Name: Edward Briggs MRN: [id number] Date of Birth: 03-May-1951  Transition of Care [facility]) CM/SW Contact  Diona Browner, [place] Phone Number: 12/23/2023, 9:56 AM  Clinical Narrative:     Insurance auth approved, next review 12/24/23. Facility notified. Awaiting medical readiness.   Expected Discharge Plan: Skilled Nursing Facility Barriers to Discharge: Continued Medical Work up, English as a second language teacher  Expected Discharge Plan and Services In-house Referral: Clinical Social Work Discharge Planning Services: NA Post Acute Care Choice: Skilled Nursing Facility Living arrangements for the past 2 months: Single Family Home                 DME Arranged: N/A DME Agency: NA                   Social Determinants of Health (SDOH) Interventions SDOH Screenings   Food Insecurity: No Food Insecurity (12/17/2023)  Housing: Low Risk  (12/17/2023)  Transportation Needs: No Transportation Needs (12/17/2023)  Utilities: Not At Risk (12/17/2023)  Alcohol Screen: Low Risk  (03/25/2023)  Depression (PHQ2-9): Low Risk  (09/18/2023)  Financial Resource Strain: Low Risk  (03/25/2023)  Physical Activity: Inactive (03/25/2023)  Social Connections: Socially Integrated (12/17/2023)  Stress: No Stress Concern Present (03/25/2023)  Tobacco Use: Medium Risk (12/17/2023)    Readmission Risk Interventions    12/20/2023   11:15 AM  Readmission Risk Prevention Plan  Post Dischage Appt Complete  Medication Screening Complete  Transportation Screening Complete

## 2023-12-23 NOTE — Plan of Care (Signed)

## 2023-12-23 NOTE — Progress Notes (Signed)
PROGRESS NOTE  Edward Briggs  JYN:[id number] DOB: [birth year] DOA: 12/17/2023 PCP: [name], MD  Consultants  Brief Narrative: 73 y.o. male with with IDDM, HTN, Parkinson's disease, confusion comes into the hospital with profound weakness, increased cough for the past 2 to 3 days. Wife and son have also had URI over the same amount of time. He has been progressively weak, unable to ambulate on his own, with fevers. He tested positive for influenza A and he was admitted to the hospital.    Assessment & Plan: Influenza A infection -with significant weakness, PT evaluated and recommending SNF -Has completed Tamiflu.  Fever resolved. -Still with fairly persistent cough.  I have ordered Tessalon Perles today to help with this. -SNF pending at this point   Active problems Acute metabolic encephalopathy on underlying Parkinson with dementia -continue home medications, likely confusion previously increased in the setting of acute infection -Mental status close to baseline.  Oriented today.   Hypertension -continue lisinopril, blood pressure is acceptable.   - Borderline low today with systolic of 107.  Will continue to monitor.   Hyperlipidemia -continue statin   Obesity, class I -he would benefit from weight loss.  BMI 31   IDDM -continue Lantus, sliding scale.  Hold metformin.  A1c acceptable in his age group, was 7.8 in November 2024  Normocytic anemia: -Likely anemia of chronic disease. -Follow hemoglobin         DVT prophylaxis:  enoxaparin (LOVENOX) injection 40 mg Start: 12/17/23 2200 SCDs Start: 12/17/23 1617  Code Status:   Code Status: Full Code Level of care: Med-Surg Status is: Inpatient Remains inpatient appropriate because: Awaiting SNF  Subjective: Patient awake alert and eating breakfast on my exam.  No complaints other than persistent cough today.  No fevers and chills.  Reports that his appetite has diminished but this has been chronic over the  past several weeks if not longer.  Objective: Vitals:   12/22/23 1213 12/22/23 2039 12/23/23 0504 12/23/23 1217  BP: 119/73 (!) 149/79 131/78 107/60  Pulse: 82 83 75 73  Resp: 18 20 18 16   Temp: 98.6 F (37 C) 98.4 F (36.9 C) 98.2 F (36.8 C) 98.1 F (36.7 C)  TempSrc: Oral Oral Oral Oral  SpO2: 97% 97% 98% 96%  Weight:      Height:        Intake/Output Summary (Last 24 hours) at 12/23/2023 1421 Last data filed at 12/23/2023 1244 Gross per 24 hour  Intake 240 ml  Output 1150 ml  Net -910 ml   Filed Weights   12/17/23 1232 12/17/23 1714  Weight: 106.6 kg 106.6 kg   Body mass index is 31.01 kg/m.  Gen: 73 y.o. male in no apparent distress.  Nontoxic Pulm: Non-labored breathing.  Clear to auscultation bilaterally.  Multiple episodes of coughing during my exam. CV: Regular rate and rhythm. No murmur, rub, or gallop. No JVD GI: Abdomen soft, non-tender, non-distended, with normoactive bowel sounds. No organomegaly or masses felt. Ext: Warm, no deformities, no pedal edema Skin: No rashes, lesions  Neuro: Alert and oriented. No focal neurological deficits. Psych: Calm  Judgement and insight appear normal. Mood & affect appropriate.     I have personally reviewed the following labs and images: CBC: Recent Labs  Lab 12/17/23 1313 12/18/23 0438 12/19/23 0448 12/23/23 0506  WBC 6.6 6.6 5.7 8.9  NEUTROABS 4.8  --   --   --   HGB 12.9* 12.3* 11.4* 11.4*  HCT 38.9* 38.2* 34.3*  33.9*  MCV 94.0 98.5 94.8 93.9  PLT 185 153 152 157   BMP &GFR Recent Labs  Lab 12/17/23 1313 12/18/23 0438 12/19/23 0158 12/21/23 0435 12/23/23 0506  NA 136 138 136 134* 136  K 4.2 3.8 3.7 3.5 3.8  CL 103 105 103 103 106  CO2 21* 20* 24 20* 24  GLUCOSE 147* 107* 106* 145* 112*  BUN 22 15 14 11 11   CREATININE 1.02 0.63 0.76 0.65 0.67  CALCIUM 9.0 8.6* 8.3* 8.2* 8.2*  MG  --   --  1.7 1.8 1.7   Estimated Creatinine Clearance: 107 mL/min (by C-G formula based on SCr of 0.67  mg/dL). Liver & Pancreas: Recent Labs  Lab 12/17/23 1313 12/19/23 0158 12/23/23 0506  AST 93* 83* 24  ALT 27 8 9   ALKPHOS 47 40 35*  BILITOT 0.8 0.6 1.0  PROT 8.1 6.6 6.4*  ALBUMIN 3.9 3.0* 2.8*   No results for input(s): "LIPASE", "AMYLASE" in the last 168 hours. No results for input(s): "AMMONIA" in the last 168 hours. Diabetic: No results for input(s): "HGBA1C" in the last 72 hours. Recent Labs  Lab 12/22/23 1212 12/22/23 1622 12/22/23 2115 12/23/23 0756 12/23/23 1213  GLUCAP 162* 165* 113* 103* 142*   Cardiac Enzymes: No results for input(s): "CKTOTAL", "CKMB", "CKMBINDEX", "TROPONINI" in the last 168 hours. No results for input(s): "PROBNP" in the last 8760 hours. Coagulation Profile: Recent Labs  Lab 12/17/23 1339  INR 1.2   Thyroid Function Tests: No results for input(s): "TSH", "T4TOTAL", "FREET4", "T3FREE", "THYROIDAB" in the last 72 hours. Lipid Profile: No results for input(s): "CHOL", "HDL", "LDLCALC", "TRIG", "CHOLHDL", "LDLDIRECT" in the last 72 hours. Anemia Panel: No results for input(s): "VITAMINB12", "FOLATE", "FERRITIN", "TIBC", "IRON", "RETICCTPCT" in the last 72 hours. Urine analysis:    Component Value Date/Time   COLORURINE YELLOW 07/22/2019 2128   APPEARANCEUR HAZY (A) 07/22/2019 2128   LABSPEC 1.009 07/22/2019 2128   PHURINE 5.0 07/22/2019 2128   GLUCOSEU NEGATIVE 07/22/2019 2128   HGBUR MODERATE (A) 07/22/2019 2128   BILIRUBINUR negative 08/31/2019 1200   BILIRUBINUR neg 09/10/2012 1254   KETONESUR negative 08/31/2019 1200   KETONESUR NEGATIVE 07/22/2019 2128   PROTEINUR negative 08/31/2019 1200   PROTEINUR NEGATIVE 07/22/2019 2128   UROBILINOGEN 0.2 08/31/2019 1200   NITRITE Negative 08/31/2019 1200   NITRITE NEGATIVE 07/22/2019 2128   LEUKOCYTESUR Negative 08/31/2019 1200   LEUKOCYTESUR LARGE (A) 07/22/2019 2128   Sepsis Labs: Invalid input(s): "PROCALCITONIN", "LACTICIDVEN"  Microbiology: Recent Results (from the past 240  hours)  Resp panel by RT-PCR (RSV, Flu A&B, Covid) Anterior Nasal Swab     Status: Abnormal   Collection Time: 12/17/23  1:13 PM   Specimen: Anterior Nasal Swab  Result Value Ref Range Status   SARS Coronavirus 2 by RT PCR NEGATIVE NEGATIVE Final    Comment: (NOTE) SARS-CoV-2 target nucleic acids are NOT DETECTED.  The SARS-CoV-2 RNA is generally detectable in upper respiratory specimens during the acute phase of infection. The lowest concentration of SARS-CoV-2 viral copies this assay can detect is 138 copies/mL. A negative result does not preclude SARS-Cov-2 infection and should not be used as the sole basis for treatment or other patient management decisions. A negative result may occur with  improper specimen collection/handling, submission of specimen other than nasopharyngeal swab, presence of viral mutation(s) within the areas targeted by this assay, and inadequate number of viral copies(<138 copies/mL). A negative result must be combined with clinical observations, patient history, and epidemiological information.  The expected result is Negative.  Fact Sheet for Patients:  BloggerCourse.com  Fact Sheet for Healthcare Providers:  SeriousBroker.it  This test is no t yet approved or cleared by the [place] FDA and  has been authorized for detection and/or diagnosis of SARS-CoV-2 by FDA under an Emergency Use Authorization (EUA). This EUA will remain  in effect (meaning this test can be used) for the duration of the COVID-19 declaration under Section 564(b)(1) of the Act, 21 U.S.C.section 360bbb-3(b)(1), unless the authorization is terminated  or revoked sooner.       Influenza A by PCR POSITIVE (A) NEGATIVE Final   Influenza B by PCR NEGATIVE NEGATIVE Final    Comment: (NOTE) The Xpert Xpress SARS-CoV-2/FLU/RSV plus assay is intended as an aid in the diagnosis of influenza from Nasopharyngeal swab specimens  and should not be used as a sole basis for treatment. Nasal washings and aspirates are unacceptable for Xpert Xpress SARS-CoV-2/FLU/RSV testing.  Fact Sheet for Patients: BloggerCourse.com  Fact Sheet for Healthcare Providers: SeriousBroker.it  This test is not yet approved or cleared by the [place] FDA and has been authorized for detection and/or diagnosis of SARS-CoV-2 by FDA under an Emergency Use Authorization (EUA). This EUA will remain in effect (meaning this test can be used) for the duration of the COVID-19 declaration under Section 564(b)(1) of the Act, 21 U.S.C. section 360bbb-3(b)(1), unless the authorization is terminated or revoked.     Resp Syncytial Virus by PCR NEGATIVE NEGATIVE Final    Comment: (NOTE) Fact Sheet for Patients: BloggerCourse.com  Fact Sheet for Healthcare Providers: SeriousBroker.it  This test is not yet approved or cleared by the [place] FDA and has been authorized for detection and/or diagnosis of SARS-CoV-2 by FDA under an Emergency Use Authorization (EUA). This EUA will remain in effect (meaning this test can be used) for the duration of the COVID-19 declaration under Section 564(b)(1) of the Act, 21 U.S.C. section 360bbb-3(b)(1), unless the authorization is terminated or revoked.  Performed at [facility], [address] [place], [place] [address]   Blood Culture (routine x 2)     Status: None   Collection Time: 12/17/23  1:35 PM   Specimen: BLOOD  Result Value Ref Range Status   Specimen Description   Final    BLOOD RIGHT ANTECUBITAL Performed at [facility], [address] [place], [place] [address]    Special Requests   Final    BOTTLES DRAWN AEROBIC AND ANAEROBIC Blood Culture results may not be optimal due to an inadequate volume of blood received in culture bottles Performed  at [facility], [address], [place], [place] [address]    Culture   Final    NO GROWTH 5 DAYS Performed at [facility], [address] [place], [place] [address]    Report Status 12/22/2023 FINAL  Final  Blood Culture (routine x 2)     Status: None   Collection Time: 12/17/23  1:39 PM   Specimen: BLOOD  Result Value Ref Range Status   Specimen Description   Final    BLOOD LEFT ANTECUBITAL Performed at [facility], [address], [place], [place] [address]    Special Requests   Final    BOTTLES DRAWN AEROBIC AND ANAEROBIC Blood Culture results may not be optimal due to an inadequate volume of blood received in culture bottles Performed at [facility], [address], [place], [place] [address]  Culture   Final    NO GROWTH 5 DAYS Performed at [facility], [address], [place], [place] [address]    Report Status 12/22/2023 FINAL  Final    Radiology Studies: No results found.  Scheduled Meds:  aspirin EC  81 mg Oral Daily   atorvastatin  10 mg Oral Daily   carbidopa-levodopa  1 tablet Oral QID   donepezil  10 mg Oral QHS   enoxaparin (LOVENOX) injection  40 mg Subcutaneous Q24H   insulin aspart  0-15 Units Subcutaneous TID WC   insulin aspart  0-5 Units Subcutaneous QHS   insulin glargine-yfgn  15 Units Subcutaneous QHS   lisinopril  5 mg Oral Daily   Continuous Infusions:   LOS: 5 days   35 minutes with more than 50% spent in reviewing records, counseling patient/family and coordinating care.  Tobey Grim, MD [facility] [email or web address] 12/23/2023, 2:21 PM

## 2023-12-23 NOTE — Plan of Care (Signed)
   Problem: Education: Goal: Ability to describe self-care measures that may prevent or decrease complications (Diabetes Survival Skills Education) will improve Outcome: Progressing Goal: Individualized Educational Video(s) Outcome: Progressing   Problem: Coping: Goal: Ability to adjust to condition or change in health will improve Outcome: Progressing

## 2023-12-24 DIAGNOSIS — R059 Cough, unspecified: Secondary | ICD-10-CM | POA: Diagnosis not present

## 2023-12-24 DIAGNOSIS — D638 Anemia in other chronic diseases classified elsewhere: Secondary | ICD-10-CM | POA: Diagnosis not present

## 2023-12-24 DIAGNOSIS — M6281 Muscle weakness (generalized): Secondary | ICD-10-CM | POA: Diagnosis not present

## 2023-12-24 DIAGNOSIS — G9341 Metabolic encephalopathy: Secondary | ICD-10-CM | POA: Diagnosis not present

## 2023-12-24 DIAGNOSIS — R531 Weakness: Secondary | ICD-10-CM | POA: Diagnosis not present

## 2023-12-24 DIAGNOSIS — E119 Type 2 diabetes mellitus without complications: Secondary | ICD-10-CM | POA: Diagnosis not present

## 2023-12-24 DIAGNOSIS — Z743 Need for continuous supervision: Secondary | ICD-10-CM | POA: Diagnosis not present

## 2023-12-24 DIAGNOSIS — G20C Parkinsonism, unspecified: Secondary | ICD-10-CM | POA: Diagnosis not present

## 2023-12-24 DIAGNOSIS — Z794 Long term (current) use of insulin: Secondary | ICD-10-CM | POA: Diagnosis not present

## 2023-12-24 DIAGNOSIS — I10 Essential (primary) hypertension: Secondary | ICD-10-CM | POA: Diagnosis not present

## 2023-12-24 DIAGNOSIS — Z7401 Bed confinement status: Secondary | ICD-10-CM | POA: Diagnosis not present

## 2023-12-24 DIAGNOSIS — E785 Hyperlipidemia, unspecified: Secondary | ICD-10-CM | POA: Diagnosis not present

## 2023-12-24 DIAGNOSIS — J101 Influenza due to other identified influenza virus with other respiratory manifestations: Secondary | ICD-10-CM | POA: Diagnosis not present

## 2023-12-24 DIAGNOSIS — E109 Type 1 diabetes mellitus without complications: Secondary | ICD-10-CM | POA: Diagnosis not present

## 2023-12-24 DIAGNOSIS — R051 Acute cough: Secondary | ICD-10-CM | POA: Diagnosis not present

## 2023-12-24 LAB — GLUCOSE, CAPILLARY: Glucose-Capillary: 111 mg/dL — ABNORMAL HIGH (ref 70–99)

## 2023-12-24 MED ORDER — BENZONATATE 200 MG PO CAPS: 200.0000 mg | ORAL_CAPSULE | Freq: Two times a day (BID) | ORAL | 0 refills | Status: DC

## 2023-12-24 MED ORDER — INSULIN GLARGINE 100 UNIT/ML ~~LOC~~ SOLN: 15.0000 [IU] | Freq: Every day | SUBCUTANEOUS | 3 refills | Status: DC

## 2023-12-24 NOTE — TOC Progression Note (Addendum)
Transition of Care [facility]) - Progression Note    Patient Details  Name: Edward Briggs MRN: [id number] Date of Birth: Oct 30, 1951  Transition of Care [facility]) CM/SW Contact  Adrian Prows, RN Phone Number: 12/24/2023, 9:26 AM  Clinical Narrative:    Notified pt ready for d/c to SNF; LVM for [name] Admissions; awaiting return call for room # and call report #.  [id number]- received callback from [facility]; she gave RM # [id number], call report # [phone], ext [phone].  Barriers to Discharge: Continued Medical Work up, Conservator, museum/gallery and Services In-house Referral: Clinical Social Work Discharge Planning Services: NA Post Acute Care Choice: Skilled Nursing Facility Living arrangements for the past 2 months: Single Family Home                 DME Arranged: N/A DME Agency: NA                   Social Determinants of Health (SDOH) Interventions SDOH Screenings   Food Insecurity: No Food Insecurity (12/17/2023)  Housing: Low Risk  (12/17/2023)  Transportation Needs: No Transportation Needs (12/17/2023)  Utilities: Not At Risk (12/17/2023)  Alcohol Screen: Low Risk  (03/25/2023)  Depression (PHQ2-9): Low Risk  (09/18/2023)  Financial Resource Strain: Low Risk  (03/25/2023)  Physical Activity: Inactive (03/25/2023)  Social Connections: Socially Integrated (12/17/2023)  Stress: No Stress Concern Present (03/25/2023)  Tobacco Use: Medium Risk (12/17/2023)    Readmission Risk Interventions    12/20/2023   11:15 AM  Readmission Risk Prevention Plan  Post Dischage Appt Complete  Medication Screening Complete  Transportation Screening Complete

## 2023-12-24 NOTE — TOC Transition Note (Addendum)
Transition of Care [facility]) - Discharge Note   Patient Details  Name: Edward Briggs MRN: [id number] Date of Birth: 29-Jan-1951  Transition of Care [facility]) CM/SW Contact:  Adrian Prows, RN Phone Number: 12/24/2023, 10:21 AM   Clinical Narrative:    D/C orders received; pt is going to [place]; per Bjorn Loser, Admissions at facility pt is going to [facility] # [id number], call report # [phone], [phone]; pt and wife Gigi Gin notified [phone]) and agree to d/c plan; SNF transfer report and D/C Summary sent via SNF hub; [facility] called at 1021; spoke w/ Ron; no TOC needs.    Final next level of care: Skilled Nursing Facility Barriers to Discharge: No Barriers Identified   Patient Goals and CMS Choice Patient states their goals for this hospitalization and ongoing recovery are:: For pt to get better          Discharge Placement              Patient [facility] bed at: [facility] Patient to be transferred to facility by: [facility] Name of family member notified: Doyal Saric (spouse) [phone] Patient and family notified of of transfer: 12/24/23  Discharge Plan and Services Additional resources added to the After Visit Summary for   In-house Referral: Clinical Social Work Discharge Planning Services: NA Post Acute Care Choice: Skilled Nursing Facility          DME Arranged: N/A DME Agency: NA                  Social Drivers of Health (SDOH) Interventions SDOH Screenings   Food Insecurity: No Food Insecurity (12/17/2023)  Housing: Low Risk  (12/17/2023)  Transportation Needs: No Transportation Needs (12/17/2023)  Utilities: Not At Risk (12/17/2023)  Alcohol Screen: Low Risk  (03/25/2023)  Depression (PHQ2-9): Low Risk  (09/18/2023)  Financial Resource Strain: Low Risk  (03/25/2023)  Physical Activity: Inactive (03/25/2023)  Social Connections: Socially Integrated (12/17/2023)  Stress: No Stress Concern Present (03/25/2023)  Tobacco Use: Medium Risk (12/17/2023)      Readmission Risk Interventions    12/20/2023   11:15 AM  Readmission Risk Prevention Plan  Post Dischage Appt Complete  Medication Screening Complete  Transportation Screening Complete

## 2023-12-24 NOTE — Plan of Care (Signed)

## 2023-12-24 NOTE — Discharge Summary (Signed)
Physician Discharge Summary   Patient: Edward Briggs MRN: [id number] DOB: [birth year]  Admit date:     12/17/2023  Discharge date: 12/24/23  Discharge Physician: Tobey Grim   PCP: [name], MD   Recommendations at discharge:   Patient was on 34 units reportedly prior to admission.  He had some decreased p.o. intake here and we reduced his insulin to 15 units subcu.  This kept his blood sugars under control and generally less than 130.  Ensure that his blood sugars are not creeping back upwards once he is on his regular diet again. Patient had fairly persistent cough.  He was discharged to SNF with [facility] as needed.  He did not remember to ask for these so it might be more beneficial to make this scheduled rather than as needed. Ensure continued resolution of fevers and that patient remains at baseline from orientation standpoint.  He was oriented and alert at discharge.  Discharge Diagnoses: Principal Problem:   Influenza A  Brief Narrative: 73 y.o. male with with IDDM, HTN, Parkinson's disease, confusion comes into the hospital with profound weakness, increased cough for several days preceding hospitalization.  Family with URI symptoms over same out of time.  Weakness and with fevers and some confusion.  Tested positive for influenza A admitted to the hospital.  He had continued improvement during hospital stay.  Confusion resolved pretty quickly after admission and starting Tamiflu.  Fever also resolved after admission.  Still with fairly significant weakness and persistent cough.  Due to weakness patient was recommended for skilled nursing facility.  Able to discharge on 12/24/2023 due to degree of improvement/being back to baseline.     Assessment & Plan: Influenza A infection -with significant weakness, PT evaluated and recommending SNF -Has completed Tamiflu.  Fever resolved. -Still with fairly persistent cough.  I have ordered Tessalon Perles today to help  with this. -SNF accepted patient   Active problems Acute metabolic encephalopathy on underlying Parkinson with dementia -continue home medications, likely confusion previously increased in the setting of acute infection -Mental status back to baseline at discharge.   Hypertension -continue lisinopril, blood pressure is acceptable.   -Blood pressure at baseline at discharge.   Hyperlipidemia -continue statin   Obesity, class I -he would benefit from weight loss.  BMI 31   IDDM -continue Lantus, sliding scale.  Hold metformin.  A1c acceptable in his age group, was 7.8 in November 2024 -Of note patient was admitted with home Lantus of 34 units.  Here in the hospital we decreased to 15 units subcu and his blood sugars remained in the 110s to 130s.   Normocytic anemia: -Likely anemia of chronic disease. -Follow hemoglobin        Disposition: Skilled nursing facility Diet recommendation:  Discharge Diet Orders (From admission, onward)     Start     Ordered   12/24/23 0000  Diet - low sodium heart healthy        12/24/23 0946           Carb modified diet DISCHARGE MEDICATION: Allergies as of 12/24/2023   No Known Allergies      Medication List     STOP taking these medications    cephALEXin 500 MG capsule Commonly known as: Keflex   Lantus SoloStar 100 UNIT/ML Solostar Pen Generic drug: insulin glargine Replaced by: insulin glargine 100 UNIT/ML injection   neomycin-polymyxin-hydrocortisone 3.5-10000-1 OTIC suspension Commonly known as: CORTISPORIN       TAKE these  medications    Accu-Chek Softclix Lancets lancets USE AS DIRECTED UP TO 4 TIMES DAILY   aspirin 81 MG tablet Take 81 mg by mouth every 7 (seven) days.   atorvastatin 10 MG tablet Commonly known as: LIPITOR Take 1 tablet (10 mg total) by mouth daily.   BD Pen Needle Nano U/F 32G X 4 MM Misc Generic drug: Insulin Pen Needle USE DAILY AS DIRECTED   benzonatate 200 MG  capsule Commonly known as: TESSALON Take 1 capsule (200 mg total) by mouth 2 (two) times daily.   blood glucose meter kit and supplies Use as instructed   blood glucose meter kit and supplies Dispense based on patient and insurance preference. Use up to four times daily as directed. (FOR ICD-10 E10.9, E11.9).   carbidopa-levodopa 25-100 MG tablet Commonly known as: SINEMET IR Take 1 tablet by mouth 4 (four) times daily. What changed:  when to take this additional instructions   donepezil 10 MG tablet Commonly known as: ARICEPT TAKE 1 TABLET BY MOUTH AT BEDTIME   glucose blood test strip Check blood sugar 3 times daily   insulin glargine 100 UNIT/ML injection Commonly known as: LANTUS Inject 0.15 mLs (15 Units total) into the skin daily. Replaces: Lantus SoloStar 100 UNIT/ML Solostar Pen   lisinopril 5 MG tablet Commonly known as: ZESTRIL Take 1 tablet by mouth once daily   Metformin HCl 500 MG/5ML Soln TAKE 10 ML BY MOUTH  TWICE DAILY What changed: See the new instructions.   TYLENOL 500 MG tablet Generic drug: acetaminophen Take 500-1,000 mg by mouth every 6 (six) hours as needed for mild pain (pain score 1-3) or headache.        Contact information for after-discharge care     Destination     [facility] Preferred SNF .   Service: Skilled Nursing Contact information: [address] [place] [address] [phone]                    Discharge Exam: Ceasar Mons Weights   12/17/23 1232 12/17/23 1714  Weight: 106.6 kg 106.6 kg   Gen: 73 y.o. male in no apparent distress.  Nontoxic Pulm: Non-labored breathing.  Clear to auscultation bilaterally.  Cough still present but better today when compared to yesterday. CV: Regular rate and rhythm. No murmur, rub, or gallop. No JVD GI: Abdomen soft, non-tender, non-distended, with normoactive bowel sounds. No organomegaly or masses felt. Ext: Warm, no deformities,  no pedal edema Skin: No rashes, lesions  Neuro: Alert and oriented. No focal neurological deficits. Psych: Calm  Judgement and insight appear normal. Mood & affect appropriate.    Condition at discharge: good  The results of significant diagnostics from this hospitalization (including imaging, microbiology, ancillary and laboratory) are listed below for reference.   Imaging Studies: DG Chest Portable 1 View Result Date: 12/17/2023 CLINICAL DATA:  Weakness.  Congestion. EXAM: PORTABLE CHEST 1 VIEW COMPARISON:  07/22/2019. FINDINGS: Bilateral lung fields are clear. Bilateral costophrenic angles are clear. Normal cardio-mediastinal silhouette. No acute osseous abnormalities. The soft tissues are within normal limits. IMPRESSION: No active disease. Electronically Signed   By: Jules Schick M.D.   On: 12/17/2023 15:13    Microbiology: Results for orders placed or performed during the hospital encounter of 12/17/23  Resp panel by RT-PCR (RSV, Flu A&B, Covid) Anterior Nasal Swab     Status: Abnormal   Collection Time: 12/17/23  1:13 PM   Specimen: Anterior Nasal Swab  Result Value Ref Range  Status   SARS Coronavirus 2 by RT PCR NEGATIVE NEGATIVE Final    Comment: (NOTE) SARS-CoV-2 target nucleic acids are NOT DETECTED.  The SARS-CoV-2 RNA is generally detectable in upper respiratory specimens during the acute phase of infection. The lowest concentration of SARS-CoV-2 viral copies this assay can detect is 138 copies/mL. A negative result does not preclude SARS-Cov-2 infection and should not be used as the sole basis for treatment or other patient management decisions. A negative result may occur with  improper specimen collection/handling, submission of specimen other than nasopharyngeal swab, presence of viral mutation(s) within the areas targeted by this assay, and inadequate number of viral copies(<138 copies/mL). A negative result must be combined with clinical observations, patient  history, and epidemiological information. The expected result is Negative.  Fact Sheet for Patients:  BloggerCourse.com  Fact Sheet for Healthcare Providers:  SeriousBroker.it  This test is no t yet approved or cleared by the [place] FDA and  has been authorized for detection and/or diagnosis of SARS-CoV-2 by FDA under an Emergency Use Authorization (EUA). This EUA will remain  in effect (meaning this test can be used) for the duration of the COVID-19 declaration under Section 564(b)(1) of the Act, 21 U.S.C.section 360bbb-3(b)(1), unless the authorization is terminated  or revoked sooner.       Influenza A by PCR POSITIVE (A) NEGATIVE Final   Influenza B by PCR NEGATIVE NEGATIVE Final    Comment: (NOTE) The Xpert Xpress SARS-CoV-2/FLU/RSV plus assay is intended as an aid in the diagnosis of influenza from Nasopharyngeal swab specimens and should not be used as a sole basis for treatment. Nasal washings and aspirates are unacceptable for Xpert Xpress SARS-CoV-2/FLU/RSV testing.  Fact Sheet for Patients: BloggerCourse.com  Fact Sheet for Healthcare Providers: SeriousBroker.it  This test is not yet approved or cleared by the [place] FDA and has been authorized for detection and/or diagnosis of SARS-CoV-2 by FDA under an Emergency Use Authorization (EUA). This EUA will remain in effect (meaning this test can be used) for the duration of the COVID-19 declaration under Section 564(b)(1) of the Act, 21 U.S.C. section 360bbb-3(b)(1), unless the authorization is terminated or revoked.     Resp Syncytial Virus by PCR NEGATIVE NEGATIVE Final    Comment: (NOTE) Fact Sheet for Patients: BloggerCourse.com  Fact Sheet for Healthcare Providers: SeriousBroker.it  This test is not yet approved or cleared by the [place]  FDA and has been authorized for detection and/or diagnosis of SARS-CoV-2 by FDA under an Emergency Use Authorization (EUA). This EUA will remain in effect (meaning this test can be used) for the duration of the COVID-19 declaration under Section 564(b)(1) of the Act, 21 U.S.C. section 360bbb-3(b)(1), unless the authorization is terminated or revoked.  Performed at [facility], [address], [place], [place] [address]   Blood Culture (routine x 2)     Status: None   Collection Time: 12/17/23  1:35 PM   Specimen: BLOOD  Result Value Ref Range Status   Specimen Description   Final    BLOOD RIGHT ANTECUBITAL Performed at [facility], [address], [place], [place] [address]    Special Requests   Final    BOTTLES DRAWN AEROBIC AND ANAEROBIC Blood Culture results may not be optimal due to an inadequate volume of blood received in culture bottles Performed at [facility], [address], [place], [place] [address]    Culture   Final    NO GROWTH  5 DAYS Performed at [facility], [address] [place], [place] [address]    Report Status 12/22/2023 FINAL  Final  Blood Culture (routine x 2)     Status: None   Collection Time: 12/17/23  1:39 PM   Specimen: BLOOD  Result Value Ref Range Status   Specimen Description   Final    BLOOD LEFT ANTECUBITAL Performed at [facility], [address] [place], [place] [address]    Special Requests   Final    BOTTLES DRAWN AEROBIC AND ANAEROBIC Blood Culture results may not be optimal due to an inadequate volume of blood received in culture bottles Performed at [facility], [address], [place], [place] [address]    Culture   Final    NO GROWTH 5 DAYS Performed at [facility], [address] [place], [place] [address]    Report Status 12/22/2023 FINAL  Final    Labs: CBC: Recent Labs  Lab 12/17/23 1313 12/18/23 0438  12/19/23 0448 12/23/23 0506  WBC 6.6 6.6 5.7 8.9  NEUTROABS 4.8  --   --   --   HGB 12.9* 12.3* 11.4* 11.4*  HCT 38.9* 38.2* 34.3* 33.9*  MCV 94.0 98.5 94.8 93.9  PLT 185 153 152 157   Basic Metabolic Panel: Recent Labs  Lab 12/17/23 1313 12/18/23 0438 12/19/23 0158 12/21/23 0435 12/23/23 0506  NA 136 138 136 134* 136  K 4.2 3.8 3.7 3.5 3.8  CL 103 105 103 103 106  CO2 21* 20* 24 20* 24  GLUCOSE 147* 107* 106* 145* 112*  BUN 22 15 14 11 11   CREATININE 1.02 0.63 0.76 0.65 0.67  CALCIUM 9.0 8.6* 8.3* 8.2* 8.2*  MG  --   --  1.7 1.8 1.7   Liver Function Tests: Recent Labs  Lab 12/17/23 1313 12/19/23 0158 12/23/23 0506  AST 93* 83* 24  ALT 27 8 9   ALKPHOS 47 40 35*  BILITOT 0.8 0.6 1.0  PROT 8.1 6.6 6.4*  ALBUMIN 3.9 3.0* 2.8*   CBG: Recent Labs  Lab 12/22/23 2115 12/23/23 0756 12/23/23 1213 12/23/23 1609 12/23/23 2047  GLUCAP 113* 103* 142* 138* 131*    Discharge time spent: less than 30 minutes.  Signed: Tobey Grim, MD [facility] 12/24/2023

## 2023-12-25 DIAGNOSIS — D638 Anemia in other chronic diseases classified elsewhere: Secondary | ICD-10-CM | POA: Diagnosis not present

## 2023-12-25 DIAGNOSIS — I10 Essential (primary) hypertension: Secondary | ICD-10-CM | POA: Diagnosis not present

## 2023-12-25 DIAGNOSIS — G9341 Metabolic encephalopathy: Secondary | ICD-10-CM | POA: Diagnosis not present

## 2023-12-25 DIAGNOSIS — G20C Parkinsonism, unspecified: Secondary | ICD-10-CM | POA: Diagnosis not present

## 2023-12-25 DIAGNOSIS — E785 Hyperlipidemia, unspecified: Secondary | ICD-10-CM | POA: Diagnosis not present

## 2023-12-25 DIAGNOSIS — R051 Acute cough: Secondary | ICD-10-CM | POA: Diagnosis not present

## 2023-12-25 DIAGNOSIS — E109 Type 1 diabetes mellitus without complications: Secondary | ICD-10-CM | POA: Diagnosis not present

## 2023-12-28 DIAGNOSIS — E785 Hyperlipidemia, unspecified: Secondary | ICD-10-CM | POA: Diagnosis not present

## 2023-12-28 DIAGNOSIS — D638 Anemia in other chronic diseases classified elsewhere: Secondary | ICD-10-CM | POA: Diagnosis not present

## 2023-12-28 DIAGNOSIS — I10 Essential (primary) hypertension: Secondary | ICD-10-CM | POA: Diagnosis not present

## 2023-12-28 DIAGNOSIS — E109 Type 1 diabetes mellitus without complications: Secondary | ICD-10-CM | POA: Diagnosis not present

## 2023-12-28 DIAGNOSIS — G20C Parkinsonism, unspecified: Secondary | ICD-10-CM | POA: Diagnosis not present

## 2023-12-28 DIAGNOSIS — R051 Acute cough: Secondary | ICD-10-CM | POA: Diagnosis not present

## 2023-12-28 DIAGNOSIS — G9341 Metabolic encephalopathy: Secondary | ICD-10-CM | POA: Diagnosis not present

## 2023-12-29 DIAGNOSIS — E785 Hyperlipidemia, unspecified: Secondary | ICD-10-CM | POA: Diagnosis not present

## 2023-12-29 DIAGNOSIS — E109 Type 1 diabetes mellitus without complications: Secondary | ICD-10-CM | POA: Diagnosis not present

## 2023-12-29 DIAGNOSIS — G9341 Metabolic encephalopathy: Secondary | ICD-10-CM | POA: Diagnosis not present

## 2023-12-29 DIAGNOSIS — D638 Anemia in other chronic diseases classified elsewhere: Secondary | ICD-10-CM | POA: Diagnosis not present

## 2023-12-29 DIAGNOSIS — E119 Type 2 diabetes mellitus without complications: Secondary | ICD-10-CM | POA: Diagnosis not present

## 2023-12-29 DIAGNOSIS — Z794 Long term (current) use of insulin: Secondary | ICD-10-CM | POA: Diagnosis not present

## 2023-12-29 DIAGNOSIS — I10 Essential (primary) hypertension: Secondary | ICD-10-CM | POA: Diagnosis not present

## 2023-12-29 DIAGNOSIS — G20C Parkinsonism, unspecified: Secondary | ICD-10-CM | POA: Diagnosis not present

## 2023-12-29 DIAGNOSIS — R051 Acute cough: Secondary | ICD-10-CM | POA: Diagnosis not present

## 2023-12-30 ENCOUNTER — Encounter: Payer: Medicare Other | Admitting: Occupational Therapy

## 2023-12-30 ENCOUNTER — Ambulatory Visit: Payer: Medicare Other | Admitting: Physical Therapy

## 2023-12-30 DIAGNOSIS — G9341 Metabolic encephalopathy: Secondary | ICD-10-CM | POA: Diagnosis not present

## 2023-12-30 DIAGNOSIS — I10 Essential (primary) hypertension: Secondary | ICD-10-CM | POA: Diagnosis not present

## 2023-12-30 DIAGNOSIS — E785 Hyperlipidemia, unspecified: Secondary | ICD-10-CM | POA: Diagnosis not present

## 2023-12-30 DIAGNOSIS — G20C Parkinsonism, unspecified: Secondary | ICD-10-CM | POA: Diagnosis not present

## 2023-12-30 DIAGNOSIS — R051 Acute cough: Secondary | ICD-10-CM | POA: Diagnosis not present

## 2023-12-30 DIAGNOSIS — D638 Anemia in other chronic diseases classified elsewhere: Secondary | ICD-10-CM | POA: Diagnosis not present

## 2023-12-30 DIAGNOSIS — E109 Type 1 diabetes mellitus without complications: Secondary | ICD-10-CM | POA: Diagnosis not present

## 2023-12-31 ENCOUNTER — Other Ambulatory Visit: Payer: Self-pay | Admitting: Family Medicine

## 2023-12-31 DIAGNOSIS — G20C Parkinsonism, unspecified: Secondary | ICD-10-CM | POA: Diagnosis not present

## 2023-12-31 DIAGNOSIS — E109 Type 1 diabetes mellitus without complications: Secondary | ICD-10-CM | POA: Diagnosis not present

## 2023-12-31 DIAGNOSIS — I10 Essential (primary) hypertension: Secondary | ICD-10-CM | POA: Diagnosis not present

## 2023-12-31 DIAGNOSIS — E785 Hyperlipidemia, unspecified: Secondary | ICD-10-CM | POA: Diagnosis not present

## 2023-12-31 DIAGNOSIS — R051 Acute cough: Secondary | ICD-10-CM | POA: Diagnosis not present

## 2023-12-31 DIAGNOSIS — D638 Anemia in other chronic diseases classified elsewhere: Secondary | ICD-10-CM | POA: Diagnosis not present

## 2023-12-31 DIAGNOSIS — G9341 Metabolic encephalopathy: Secondary | ICD-10-CM | POA: Diagnosis not present

## 2023-12-31 DIAGNOSIS — E1165 Type 2 diabetes mellitus with hyperglycemia: Secondary | ICD-10-CM

## 2024-01-01 DIAGNOSIS — I10 Essential (primary) hypertension: Secondary | ICD-10-CM | POA: Diagnosis not present

## 2024-01-01 DIAGNOSIS — D638 Anemia in other chronic diseases classified elsewhere: Secondary | ICD-10-CM | POA: Diagnosis not present

## 2024-01-01 DIAGNOSIS — R051 Acute cough: Secondary | ICD-10-CM | POA: Diagnosis not present

## 2024-01-01 DIAGNOSIS — E109 Type 1 diabetes mellitus without complications: Secondary | ICD-10-CM | POA: Diagnosis not present

## 2024-01-01 DIAGNOSIS — G9341 Metabolic encephalopathy: Secondary | ICD-10-CM | POA: Diagnosis not present

## 2024-01-01 DIAGNOSIS — G20C Parkinsonism, unspecified: Secondary | ICD-10-CM | POA: Diagnosis not present

## 2024-01-01 DIAGNOSIS — E785 Hyperlipidemia, unspecified: Secondary | ICD-10-CM | POA: Diagnosis not present

## 2024-01-02 DIAGNOSIS — D638 Anemia in other chronic diseases classified elsewhere: Secondary | ICD-10-CM | POA: Diagnosis not present

## 2024-01-02 DIAGNOSIS — R051 Acute cough: Secondary | ICD-10-CM | POA: Diagnosis not present

## 2024-01-02 DIAGNOSIS — E109 Type 1 diabetes mellitus without complications: Secondary | ICD-10-CM | POA: Diagnosis not present

## 2024-01-02 DIAGNOSIS — G9341 Metabolic encephalopathy: Secondary | ICD-10-CM | POA: Diagnosis not present

## 2024-01-02 DIAGNOSIS — E785 Hyperlipidemia, unspecified: Secondary | ICD-10-CM | POA: Diagnosis not present

## 2024-01-02 DIAGNOSIS — I10 Essential (primary) hypertension: Secondary | ICD-10-CM | POA: Diagnosis not present

## 2024-01-02 DIAGNOSIS — G20C Parkinsonism, unspecified: Secondary | ICD-10-CM | POA: Diagnosis not present

## 2024-01-04 DIAGNOSIS — I10 Essential (primary) hypertension: Secondary | ICD-10-CM | POA: Diagnosis not present

## 2024-01-04 DIAGNOSIS — R051 Acute cough: Secondary | ICD-10-CM | POA: Diagnosis not present

## 2024-01-04 DIAGNOSIS — E109 Type 1 diabetes mellitus without complications: Secondary | ICD-10-CM | POA: Diagnosis not present

## 2024-01-04 DIAGNOSIS — G20C Parkinsonism, unspecified: Secondary | ICD-10-CM | POA: Diagnosis not present

## 2024-01-04 DIAGNOSIS — G9341 Metabolic encephalopathy: Secondary | ICD-10-CM | POA: Diagnosis not present

## 2024-01-04 DIAGNOSIS — E785 Hyperlipidemia, unspecified: Secondary | ICD-10-CM | POA: Diagnosis not present

## 2024-01-04 DIAGNOSIS — D638 Anemia in other chronic diseases classified elsewhere: Secondary | ICD-10-CM | POA: Diagnosis not present

## 2024-01-06 DIAGNOSIS — E109 Type 1 diabetes mellitus without complications: Secondary | ICD-10-CM | POA: Diagnosis not present

## 2024-01-06 DIAGNOSIS — G20C Parkinsonism, unspecified: Secondary | ICD-10-CM | POA: Diagnosis not present

## 2024-01-06 DIAGNOSIS — D638 Anemia in other chronic diseases classified elsewhere: Secondary | ICD-10-CM | POA: Diagnosis not present

## 2024-01-06 DIAGNOSIS — G9341 Metabolic encephalopathy: Secondary | ICD-10-CM | POA: Diagnosis not present

## 2024-01-06 DIAGNOSIS — R051 Acute cough: Secondary | ICD-10-CM | POA: Diagnosis not present

## 2024-01-06 DIAGNOSIS — I10 Essential (primary) hypertension: Secondary | ICD-10-CM | POA: Diagnosis not present

## 2024-01-06 DIAGNOSIS — E785 Hyperlipidemia, unspecified: Secondary | ICD-10-CM | POA: Diagnosis not present

## 2024-01-07 ENCOUNTER — Other Ambulatory Visit: Payer: Self-pay | Admitting: Family Medicine

## 2024-01-07 DIAGNOSIS — I10 Essential (primary) hypertension: Secondary | ICD-10-CM

## 2024-01-08 DIAGNOSIS — G20C Parkinsonism, unspecified: Secondary | ICD-10-CM | POA: Diagnosis not present

## 2024-01-08 DIAGNOSIS — E785 Hyperlipidemia, unspecified: Secondary | ICD-10-CM | POA: Diagnosis not present

## 2024-01-08 DIAGNOSIS — R051 Acute cough: Secondary | ICD-10-CM | POA: Diagnosis not present

## 2024-01-08 DIAGNOSIS — E109 Type 1 diabetes mellitus without complications: Secondary | ICD-10-CM | POA: Diagnosis not present

## 2024-01-08 DIAGNOSIS — D638 Anemia in other chronic diseases classified elsewhere: Secondary | ICD-10-CM | POA: Diagnosis not present

## 2024-01-08 DIAGNOSIS — G9341 Metabolic encephalopathy: Secondary | ICD-10-CM | POA: Diagnosis not present

## 2024-01-08 DIAGNOSIS — I10 Essential (primary) hypertension: Secondary | ICD-10-CM | POA: Diagnosis not present

## 2024-01-10 DIAGNOSIS — M6281 Muscle weakness (generalized): Secondary | ICD-10-CM | POA: Diagnosis not present

## 2024-01-10 DIAGNOSIS — D638 Anemia in other chronic diseases classified elsewhere: Secondary | ICD-10-CM | POA: Diagnosis not present

## 2024-01-10 DIAGNOSIS — E785 Hyperlipidemia, unspecified: Secondary | ICD-10-CM | POA: Diagnosis not present

## 2024-01-10 DIAGNOSIS — Z794 Long term (current) use of insulin: Secondary | ICD-10-CM | POA: Diagnosis not present

## 2024-01-10 DIAGNOSIS — G20C Parkinsonism, unspecified: Secondary | ICD-10-CM | POA: Diagnosis not present

## 2024-01-10 DIAGNOSIS — I10 Essential (primary) hypertension: Secondary | ICD-10-CM | POA: Diagnosis not present

## 2024-01-10 DIAGNOSIS — R051 Acute cough: Secondary | ICD-10-CM | POA: Diagnosis not present

## 2024-01-10 DIAGNOSIS — E119 Type 2 diabetes mellitus without complications: Secondary | ICD-10-CM | POA: Diagnosis not present

## 2024-01-10 DIAGNOSIS — G9341 Metabolic encephalopathy: Secondary | ICD-10-CM | POA: Diagnosis not present

## 2024-01-11 DIAGNOSIS — D638 Anemia in other chronic diseases classified elsewhere: Secondary | ICD-10-CM | POA: Diagnosis not present

## 2024-01-11 DIAGNOSIS — E785 Hyperlipidemia, unspecified: Secondary | ICD-10-CM | POA: Diagnosis not present

## 2024-01-11 DIAGNOSIS — R051 Acute cough: Secondary | ICD-10-CM | POA: Diagnosis not present

## 2024-01-11 DIAGNOSIS — G20C Parkinsonism, unspecified: Secondary | ICD-10-CM | POA: Diagnosis not present

## 2024-01-11 DIAGNOSIS — E109 Type 1 diabetes mellitus without complications: Secondary | ICD-10-CM | POA: Diagnosis not present

## 2024-01-11 DIAGNOSIS — G9341 Metabolic encephalopathy: Secondary | ICD-10-CM | POA: Diagnosis not present

## 2024-01-11 DIAGNOSIS — I10 Essential (primary) hypertension: Secondary | ICD-10-CM | POA: Diagnosis not present

## 2024-01-12 DIAGNOSIS — I10 Essential (primary) hypertension: Secondary | ICD-10-CM | POA: Diagnosis not present

## 2024-01-12 DIAGNOSIS — D638 Anemia in other chronic diseases classified elsewhere: Secondary | ICD-10-CM | POA: Diagnosis not present

## 2024-01-12 DIAGNOSIS — G9341 Metabolic encephalopathy: Secondary | ICD-10-CM | POA: Diagnosis not present

## 2024-01-12 DIAGNOSIS — G20C Parkinsonism, unspecified: Secondary | ICD-10-CM | POA: Diagnosis not present

## 2024-01-12 DIAGNOSIS — E109 Type 1 diabetes mellitus without complications: Secondary | ICD-10-CM | POA: Diagnosis not present

## 2024-01-12 DIAGNOSIS — E785 Hyperlipidemia, unspecified: Secondary | ICD-10-CM | POA: Diagnosis not present

## 2024-01-12 DIAGNOSIS — R051 Acute cough: Secondary | ICD-10-CM | POA: Diagnosis not present

## 2024-01-16 DIAGNOSIS — I10 Essential (primary) hypertension: Secondary | ICD-10-CM | POA: Diagnosis not present

## 2024-01-16 DIAGNOSIS — Z556 Problems related to health literacy: Secondary | ICD-10-CM | POA: Diagnosis not present

## 2024-01-16 DIAGNOSIS — E785 Hyperlipidemia, unspecified: Secondary | ICD-10-CM | POA: Diagnosis not present

## 2024-01-16 DIAGNOSIS — Z794 Long term (current) use of insulin: Secondary | ICD-10-CM | POA: Diagnosis not present

## 2024-01-16 DIAGNOSIS — E119 Type 2 diabetes mellitus without complications: Secondary | ICD-10-CM | POA: Diagnosis not present

## 2024-01-16 DIAGNOSIS — D638 Anemia in other chronic diseases classified elsewhere: Secondary | ICD-10-CM | POA: Diagnosis not present

## 2024-01-16 DIAGNOSIS — Z7984 Long term (current) use of oral hypoglycemic drugs: Secondary | ICD-10-CM | POA: Diagnosis not present

## 2024-01-16 DIAGNOSIS — G9341 Metabolic encephalopathy: Secondary | ICD-10-CM | POA: Diagnosis not present

## 2024-01-16 DIAGNOSIS — M109 Gout, unspecified: Secondary | ICD-10-CM | POA: Diagnosis not present

## 2024-01-16 DIAGNOSIS — Z7982 Long term (current) use of aspirin: Secondary | ICD-10-CM | POA: Diagnosis not present

## 2024-01-16 DIAGNOSIS — G20C Parkinsonism, unspecified: Secondary | ICD-10-CM | POA: Diagnosis not present

## 2024-01-19 ENCOUNTER — Other Ambulatory Visit: Payer: Self-pay

## 2024-01-19 ENCOUNTER — Other Ambulatory Visit: Payer: Self-pay | Admitting: Family Medicine

## 2024-01-19 DIAGNOSIS — Z794 Long term (current) use of insulin: Secondary | ICD-10-CM | POA: Diagnosis not present

## 2024-01-19 DIAGNOSIS — D638 Anemia in other chronic diseases classified elsewhere: Secondary | ICD-10-CM | POA: Diagnosis not present

## 2024-01-19 DIAGNOSIS — G9341 Metabolic encephalopathy: Secondary | ICD-10-CM | POA: Diagnosis not present

## 2024-01-19 DIAGNOSIS — Z556 Problems related to health literacy: Secondary | ICD-10-CM | POA: Diagnosis not present

## 2024-01-19 DIAGNOSIS — Z7982 Long term (current) use of aspirin: Secondary | ICD-10-CM | POA: Diagnosis not present

## 2024-01-19 DIAGNOSIS — E119 Type 2 diabetes mellitus without complications: Secondary | ICD-10-CM | POA: Diagnosis not present

## 2024-01-19 DIAGNOSIS — G20C Parkinsonism, unspecified: Secondary | ICD-10-CM | POA: Diagnosis not present

## 2024-01-19 DIAGNOSIS — M109 Gout, unspecified: Secondary | ICD-10-CM | POA: Diagnosis not present

## 2024-01-19 DIAGNOSIS — I10 Essential (primary) hypertension: Secondary | ICD-10-CM | POA: Diagnosis not present

## 2024-01-19 DIAGNOSIS — E785 Hyperlipidemia, unspecified: Secondary | ICD-10-CM | POA: Diagnosis not present

## 2024-01-19 DIAGNOSIS — Z7984 Long term (current) use of oral hypoglycemic drugs: Secondary | ICD-10-CM | POA: Diagnosis not present

## 2024-01-19 DIAGNOSIS — E1165 Type 2 diabetes mellitus with hyperglycemia: Secondary | ICD-10-CM

## 2024-01-21 ENCOUNTER — Ambulatory Visit: Payer: Medicare Other | Admitting: Family Medicine

## 2024-01-21 ENCOUNTER — Other Ambulatory Visit: Payer: Self-pay | Admitting: Family Medicine

## 2024-01-21 ENCOUNTER — Encounter: Payer: Self-pay | Admitting: Family Medicine

## 2024-01-21 VITALS — BP 118/68 | HR 87 | Temp 98.1°F | Wt 238.0 lb

## 2024-01-21 DIAGNOSIS — M545 Low back pain, unspecified: Secondary | ICD-10-CM | POA: Diagnosis not present

## 2024-01-21 DIAGNOSIS — E785 Hyperlipidemia, unspecified: Secondary | ICD-10-CM | POA: Diagnosis not present

## 2024-01-21 DIAGNOSIS — R351 Nocturia: Secondary | ICD-10-CM

## 2024-01-21 DIAGNOSIS — E1169 Type 2 diabetes mellitus with other specified complication: Secondary | ICD-10-CM | POA: Diagnosis not present

## 2024-01-21 DIAGNOSIS — E1165 Type 2 diabetes mellitus with hyperglycemia: Secondary | ICD-10-CM

## 2024-01-21 DIAGNOSIS — D649 Anemia, unspecified: Secondary | ICD-10-CM

## 2024-01-21 DIAGNOSIS — R350 Frequency of micturition: Secondary | ICD-10-CM | POA: Diagnosis not present

## 2024-01-21 DIAGNOSIS — Z794 Long term (current) use of insulin: Secondary | ICD-10-CM

## 2024-01-21 DIAGNOSIS — G20C Parkinsonism, unspecified: Secondary | ICD-10-CM

## 2024-01-21 DIAGNOSIS — G9341 Metabolic encephalopathy: Secondary | ICD-10-CM

## 2024-01-21 LAB — COMPREHENSIVE METABOLIC PANEL
ALT: 7 U/L (ref 0–53)
AST: 9 U/L (ref 0–37)
Albumin: 3.7 g/dL (ref 3.5–5.2)
Alkaline Phosphatase: 58 U/L (ref 39–117)
BUN: 8 mg/dL (ref 6–23)
CO2: 28 meq/L (ref 19–32)
Calcium: 8.7 mg/dL (ref 8.4–10.5)
Chloride: 103 meq/L (ref 96–112)
Creatinine, Ser: 0.81 mg/dL (ref 0.40–1.50)
GFR: 88.25 mL/min (ref >60.00)
Glucose, Bld: 145 mg/dL — ABNORMAL HIGH (ref 70–99)
Potassium: 4.5 meq/L (ref 3.5–5.1)
Sodium: 139 meq/L (ref 135–145)
Total Bilirubin: 0.5 mg/dL (ref 0.2–1.2)
Total Protein: 6.8 g/dL (ref 6.0–8.3)

## 2024-01-21 LAB — POCT URINALYSIS DIP (MANUAL ENTRY)
Bilirubin, UA: NEGATIVE
Blood, UA: NEGATIVE
Glucose, UA: NEGATIVE mg/dL
Ketones, POC UA: NEGATIVE mg/dL
Leukocytes, UA: NEGATIVE
Nitrite, UA: NEGATIVE
Protein Ur, POC: NEGATIVE mg/dL
Spec Grav, UA: 1.015 (ref 1.010–1.025)
Urobilinogen, UA: 0.2 U/dL
pH, UA: 6 (ref 5.0–8.0)

## 2024-01-21 LAB — CBC
HCT: 35.7 % — ABNORMAL LOW (ref 39.0–52.0)
Hemoglobin: 11.8 g/dL — ABNORMAL LOW (ref 13.0–17.0)
MCHC: 33.2 g/dL (ref 30.0–36.0)
MCV: 93.9 fl (ref 78.0–100.0)
Platelets: 204 10*3/uL (ref 150.0–400.0)
RBC: 3.8 Mil/uL — ABNORMAL LOW (ref 4.22–5.81)
RDW: 12.8 % (ref 11.5–15.5)
WBC: 11 10*3/uL — ABNORMAL HIGH (ref 4.0–10.5)

## 2024-01-21 LAB — PSA: PSA: 0.48 ng/mL (ref 0.10–4.00)

## 2024-01-21 LAB — LIPID PANEL
Cholesterol: 93 mg/dL (ref 0–200)
HDL: 42.6 mg/dL (ref >39.00)
LDL Cholesterol: 39 mg/dL (ref 0–99)
NonHDL: 50.54
Total CHOL/HDL Ratio: 2
Triglycerides: 58 mg/dL (ref 0.0–149.0)
VLDL: 11.6 mg/dL (ref 0.0–40.0)

## 2024-01-21 LAB — HEMOGLOBIN A1C: Hgb A1c MFr Bld: 7.9 % — ABNORMAL HIGH (ref 4.6–6.5)

## 2024-01-21 NOTE — Patient Instructions (Signed)
 No med changes at this time.  I will check your labs including the urine test and let you know if any new medications needed.  Follow-up in 3 months depending on urinary symptoms.  If any new or worsening symptoms only see you sooner.  Take care

## 2024-01-21 NOTE — Progress Notes (Signed)
 Subjective:  Patient ID: Edward Briggs, male    DOB: 12-08-1950  Age: 73 y.o. MRN: [id number]  CC:  Chief Complaint  Patient presents with   Back Pain    Patient states back pain has eased up quit a bit. Patient also has a handicap plaque paper needed to be filled out.  Was in hospital and went to rehab jut got out of rehab on the 11th of march.     Medical Management of Chronic Issues    Patient states no concerns     HPI Edward Briggs presents for   Influenza A, acute metabolic encephalopathy, underlying Parkinson's with dementia Admitted 12/17/2023 - 12/24/2023.  History of Parkinson's but presented with profound weakness, increased cough of several days duration.  Tested positive for influenza A.  Confusion thought to be in the setting of acute infection, symptomatic care and during hospital stay and confusion resolved quickly after admission and treatment with Tamiflu.  Fevers also resolved.  He did have some persistent weakness, persistent cough and discharge to skilled nursing facility with Jerilynn Som as needed for cough.  Mental status was back at baseline at discharge. Discharged from rehab on March 11.  Now back at home. Cough has resolved. Feeling better. No fever.  Still with home health modalities in place. Using rolling walker for ambulation.  No recent falls.    Diabetes: Previously on 34 units of insulin prior to admission to the hospital above.  With decreased p.o. intake his insulin was decreased to 15 units with overall stable control, generally less than 130.  Plan for monitoring of glycemic control back on regular diet.  Metformin was held during hospitalization, insulin sliding scale during hospitalization. He was continued on lisinopril for hypertension, statin for hyperlipidemia. Has used metformin solution d/t pill dysphagia.  Back on 34 units insulin since home.  Home readings 120-125.  No lows. No recent 200's.   Microalbumin: normal 03/13/23.   Optho, foot exam, pneumovax:  Optho - appt in April.  Covid booster last fall.   Diabetic Foot Exam - Simple   No data filed      Lab Results  Component Value Date   HGBA1C 7.8 (H) 09/18/2023   HGBA1C 7.6 (H) 06/18/2023   HGBA1C 7.3 (H) 03/13/2023   Lab Results  Component Value Date   MICROALBUR <0.7 03/13/2023   LDLCALC 87 09/18/2023   CREATININE 0.67 12/23/2023    Parkinson's disease Followed by Dr. Teresa Coombs, treated with Sinemet.  25/100 mg, 4 times daily.  Has also been treated with Aricept for mild cognitive impairment. Has been followed with outpatient rehab with PT, OT, SLP prior. Has new home health agency in place for PT.  Appt with Dr. Teresa Coombs 03/10/24 handicap placard paperwork completed today.  Anemia Thought to have anemia of chronic disease during hospitalization, continue to monitor hemoglobin planned.  No melena/hematochezia.   Lab Results  Component Value Date   WBC 8.9 12/23/2023   HGB 11.4 (L) 12/23/2023   HCT 33.9 (L) 12/23/2023   MCV 93.9 12/23/2023   PLT 157 12/23/2023    Chronic back pain Tylenol, other over-the-counter treatments discussed previously including topical creams when discussed in August.  Imaging In 2023 without acute findings of the lumbar spine. Back pain has improved. Topical Bengay has been helpful.   Frequent urination Past 2-3 weeks. More often, no fever, abd pain or new back pain. Nocturia 2x/night. New.  No change in caffeinated beverages.   History Patient Active Problem  List   Diagnosis Date Noted   Influenza A 12/17/2023   HTN (hypertension) 07/22/2019   Acute metabolic encephalopathy 07/22/2019   AKI (acute kidney injury) (HCC) 07/22/2019   Sepsis (HCC) 07/22/2019   UTI (urinary tract infection) 07/22/2019   Diabetes mellitus without complication (HCC) 09/10/2012   CHEST PAIN 07/21/2009   Past Medical History:  Diagnosis Date   Diabetes mellitus without complication (HCC)    GERD (gastroesophageal reflux  disease)    no meds   Hypertension    controlled on meds   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   Past Surgical History:  Procedure Laterality Date   ABSCESS DRAINAGE     COLONOSCOPY     ~ 10 yrs ago   FRACTURE SURGERY     HARDWARE REMOVAL  03/26/2012   Procedure: HARDWARE REMOVAL;  Surgeon: Sherri Rad, MD;  Location: [facility];  Service: Orthopedics;  Laterality: Left;  hardware removal deep left ankle syndesmotic screws only and stress xrays ankle    ORIF ANKLE FRACTURE  12/03/2011   Procedure: OPEN REDUCTION INTERNAL FIXATION (ORIF) ANKLE FRACTURE;  Surgeon: Sherri Rad, MD;  Location:  SURGERY CENTER;  Service: Orthopedics;  Laterality: Left;  left medial malleolus fracture   PILONIDAL CYST EXCISION  ~1982   SKIN GRAFT  ~1970   Lt great toe (graft from Lt thigh)   No Known Allergies Prior to Admission medications   Medication Sig Start Date End Date Taking? Authorizing Provider  Accu-Chek Softclix Lancets lancets USE AS DIRECTED UP TO 4 TIMES DAILY 01/22/22  Yes [name], MD  aspirin 81 MG tablet Take 81 mg by mouth every 7 (seven) days.   Yes [provider]  atorvastatin (LIPITOR) 10 MG tablet Take 1 tablet (10 mg total) by mouth daily. 03/13/23  Yes [name], MD  benzonatate (TESSALON) 200 MG capsule Take 1 capsule (200 mg total) by mouth 2 (two) times daily. 12/24/23  Yes Tobey Grim, MD  blood glucose meter kit and supplies Dispense based on patient and insurance preference. Use up to four times daily as directed. (FOR ICD-10 E10.9, E11.9). 11/25/21  Yes [name], MD  Blood Glucose Monitoring Suppl (BLOOD GLUCOSE METER) kit Use as instructed 09/10/12  Yes Peyton Najjar, MD  carbidopa-levodopa (SINEMET IR) 25-100 MG tablet Take 1 tablet by mouth 4 (four) times daily. Patient taking differently: Take 1 tablet by mouth See admin instructions. Take 1 tablet by mouth at 11 AM, 2 PM, 5 PM, and at bedtime 03/05/23 02/28/24  Yes Camara, Amadou, MD  donepezil (ARICEPT) 10 MG tablet TAKE 1 TABLET BY MOUTH AT BEDTIME 11/18/23  Yes Camara, Amadou, MD  glucose blood test strip Check blood sugar 3 times daily 11/25/21  Yes [name], MD  insulin glargine (LANTUS) 100 UNIT/ML injection Inject 0.15 mLs (15 Units total) into the skin daily. 12/24/23  Yes Tobey Grim, MD  Insulin Pen Needle (BD PEN NEEDLE NANO U/F) 32G X 4 MM MISC USE DAILY AS DIRECTED 03/28/21  Yes [name], MD  lisinopril (ZESTRIL) 5 MG tablet Take 1 tablet by mouth once daily 01/07/24  Yes [name], MD  Metformin HCl 500 MG/5ML SOLN TAKE 10 ML BY MOUTH  TWICE DAILY . APPOINTMENT REQUIRED FOR FUTURE REFILLS 01/19/24  Yes [name], MD  TYLENOL 500 MG tablet Take 500-1,000 mg by mouth every 6 (six) hours as needed for mild pain (pain score  1-3) or headache.   Yes [provider]   Social History   Socioeconomic History   Marital status: Married    Spouse name: Not on file   Number of children: 2   Years of education: Not on file   Highest education level: Not on file  Occupational History   Occupation: retired  Tobacco Use   Smoking status: Former    Current packs/day: 0.00    Types: Cigarettes    Quit date: 10/30/1988    Years since quitting: 35.2   Smokeless tobacco: Never  Vaping Use   Vaping status: Never Used  Substance and Sexual Activity   Alcohol use: Yes    Alcohol/week: 1.0 standard drink of alcohol    Types: 1 Shots of liquor per week    Comment: every two weeks   Drug use: No   Sexual activity: Yes    Birth control/protection: None  Other Topics Concern   Not on file  Social History Narrative   Not on file   Social Drivers of Health   Financial Resource Strain: Low Risk  (03/25/2023)   Overall Financial Resource Strain (CARDIA)    Difficulty of Paying Living Expenses: Not hard at all  Food Insecurity: No Food Insecurity (12/17/2023)   Hunger Vital Sign    Worried About Running Out  of Food in the Last Year: Never true    Ran Out of Food in the Last Year: Never true  Transportation Needs: No Transportation Needs (12/17/2023)   PRAPARE - Administrator, Civil Service (Medical): No    Lack of Transportation (Non-Medical): No  Physical Activity: Inactive (03/25/2023)   Exercise Vital Sign    Days of Exercise per Week: 0 days    Minutes of Exercise per Session: 0 min  Stress: No Stress Concern Present (03/25/2023)   [employer] of Occupational Health - Occupational Stress Questionnaire    Feeling of Stress : Not at all  Social Connections: Socially Integrated (12/17/2023)   Social Connection and Isolation Panel [NHANES]    Frequency of Communication with Friends and Family: Three times a week    Frequency of Social Gatherings with Friends and Family: Twice a week    Attends Religious Services: More than 4 times per year    Active Member of [employer] or Organizations: No    Attends Engineer, structural: More than 4 times per year    Marital Status: Married  Catering manager Violence: Not At Risk (12/17/2023)   Humiliation, Afraid, Rape, and Kick questionnaire    Fear of Current or Ex-Partner: No    Emotionally Abused: No    Physically Abused: No    Sexually Abused: No    Review of Systems  Per HPI Objective:   Vitals:   01/21/24 1253  BP: 118/68  Pulse: 87  Temp: 98.1 F (36.7 C)  TempSrc: Temporal  SpO2: 99%  Weight: 238 lb (108 kg)     Physical Exam Vitals reviewed.  Constitutional:      Appearance: He is well-developed.  HENT:     Head: Normocephalic and atraumatic.  Neck:     Vascular: No carotid bruit or JVD.  Cardiovascular:     Rate and Rhythm: Normal rate and regular rhythm.     Heart sounds: Normal heart sounds. No murmur heard. Pulmonary:     Effort: Pulmonary effort is normal.     Breath sounds: Normal breath sounds. No rales.  Abdominal:     General: There  is no distension.     Tenderness: There is no  abdominal tenderness. There is no right CVA tenderness or left CVA tenderness.  Musculoskeletal:     Right lower leg: No edema.     Left lower leg: No edema.  Skin:    General: Skin is warm and dry.  Neurological:     Mental Status: He is alert and oriented to person, place, and time.  Psychiatric:        Mood and Affect: Mood normal.     Assessment & Plan:  Edward Briggs is a 73 y.o. male . Type 2 diabetes mellitus with hyperglycemia, with long-term current use of insulin (HCC) - Plan: Comprehensive metabolic panel, Hemoglobin A1c, Lipid panel  -Check labs, adjust regimen accordingly.  He is back on 34 units of Lantus, continue on metformin suspension.  Recheck 3 months.  Repeat urine microalbumin at that time.  Ophthalmology appointment soon.  Hyperlipidemia associated with type 2 diabetes mellitus (HCC) - Plan: Comprehensive metabolic panel, Hemoglobin A1c, Lipid panel  -Statin as above, tolerating current med regimen, check labs and adjust plan accordingly  Urinary frequency - Plan: POCT urinalysis dipstick, Urine Culture, PSA Nocturia  - check PSA, urine culture.   Anemia, unspecified type - Plan: CBC  -Likely anemia of chronic disease, check CBC for stability.  Acute metabolic encephalopathy  -Cleared after treatment for influenza as above.  No residual symptoms.  Parkinsonism, unspecified Parkinsonism type (HCC)  -On Sinemet as above with upcoming appointment with neurology.  No med changes.  Handicap placard paperwork completed.  Midline low back pain without sciatica, unspecified chronicity  -Improved, topical treatment, symptomatic care and PT as above.  RTC precautions.  No orders of the defined types were placed in this encounter.  Patient Instructions  No med changes at this time.  I will check your labs including the urine test and let you know if any new medications needed.  Follow-up in 3 months depending on urinary symptoms.  If any new or worsening symptoms  only see you sooner.  Take care    Signed,   Meredith Staggers, MD [facility], [facility] 01/21/24 1:36 PM

## 2024-01-22 DIAGNOSIS — I10 Essential (primary) hypertension: Secondary | ICD-10-CM | POA: Diagnosis not present

## 2024-01-22 DIAGNOSIS — G20C Parkinsonism, unspecified: Secondary | ICD-10-CM | POA: Diagnosis not present

## 2024-01-22 DIAGNOSIS — D638 Anemia in other chronic diseases classified elsewhere: Secondary | ICD-10-CM | POA: Diagnosis not present

## 2024-01-22 DIAGNOSIS — Z7984 Long term (current) use of oral hypoglycemic drugs: Secondary | ICD-10-CM | POA: Diagnosis not present

## 2024-01-22 DIAGNOSIS — Z794 Long term (current) use of insulin: Secondary | ICD-10-CM | POA: Diagnosis not present

## 2024-01-22 DIAGNOSIS — M109 Gout, unspecified: Secondary | ICD-10-CM | POA: Diagnosis not present

## 2024-01-22 DIAGNOSIS — G9341 Metabolic encephalopathy: Secondary | ICD-10-CM | POA: Diagnosis not present

## 2024-01-22 DIAGNOSIS — Z7982 Long term (current) use of aspirin: Secondary | ICD-10-CM | POA: Diagnosis not present

## 2024-01-22 DIAGNOSIS — E785 Hyperlipidemia, unspecified: Secondary | ICD-10-CM | POA: Diagnosis not present

## 2024-01-22 DIAGNOSIS — E119 Type 2 diabetes mellitus without complications: Secondary | ICD-10-CM | POA: Diagnosis not present

## 2024-01-22 DIAGNOSIS — Z556 Problems related to health literacy: Secondary | ICD-10-CM | POA: Diagnosis not present

## 2024-01-22 LAB — URINE CULTURE
MICRO NUMBER:: 16226744
Result:: NO GROWTH
SPECIMEN QUALITY:: ADEQUATE

## 2024-01-26 DIAGNOSIS — Z7982 Long term (current) use of aspirin: Secondary | ICD-10-CM | POA: Diagnosis not present

## 2024-01-26 DIAGNOSIS — I10 Essential (primary) hypertension: Secondary | ICD-10-CM | POA: Diagnosis not present

## 2024-01-26 DIAGNOSIS — E119 Type 2 diabetes mellitus without complications: Secondary | ICD-10-CM | POA: Diagnosis not present

## 2024-01-26 DIAGNOSIS — Z556 Problems related to health literacy: Secondary | ICD-10-CM | POA: Diagnosis not present

## 2024-01-26 DIAGNOSIS — Z7984 Long term (current) use of oral hypoglycemic drugs: Secondary | ICD-10-CM | POA: Diagnosis not present

## 2024-01-26 DIAGNOSIS — M109 Gout, unspecified: Secondary | ICD-10-CM | POA: Diagnosis not present

## 2024-01-26 DIAGNOSIS — E785 Hyperlipidemia, unspecified: Secondary | ICD-10-CM | POA: Diagnosis not present

## 2024-01-26 DIAGNOSIS — Z794 Long term (current) use of insulin: Secondary | ICD-10-CM | POA: Diagnosis not present

## 2024-01-26 DIAGNOSIS — G9341 Metabolic encephalopathy: Secondary | ICD-10-CM | POA: Diagnosis not present

## 2024-01-26 DIAGNOSIS — G20C Parkinsonism, unspecified: Secondary | ICD-10-CM | POA: Diagnosis not present

## 2024-01-26 DIAGNOSIS — D638 Anemia in other chronic diseases classified elsewhere: Secondary | ICD-10-CM | POA: Diagnosis not present

## 2024-01-27 ENCOUNTER — Telehealth: Payer: Self-pay

## 2024-01-27 DIAGNOSIS — Z7984 Long term (current) use of oral hypoglycemic drugs: Secondary | ICD-10-CM | POA: Diagnosis not present

## 2024-01-27 DIAGNOSIS — Z794 Long term (current) use of insulin: Secondary | ICD-10-CM | POA: Diagnosis not present

## 2024-01-27 DIAGNOSIS — I10 Essential (primary) hypertension: Secondary | ICD-10-CM | POA: Diagnosis not present

## 2024-01-27 DIAGNOSIS — E785 Hyperlipidemia, unspecified: Secondary | ICD-10-CM | POA: Diagnosis not present

## 2024-01-27 DIAGNOSIS — Z556 Problems related to health literacy: Secondary | ICD-10-CM | POA: Diagnosis not present

## 2024-01-27 DIAGNOSIS — D638 Anemia in other chronic diseases classified elsewhere: Secondary | ICD-10-CM | POA: Diagnosis not present

## 2024-01-27 DIAGNOSIS — G9341 Metabolic encephalopathy: Secondary | ICD-10-CM | POA: Diagnosis not present

## 2024-01-27 DIAGNOSIS — G20C Parkinsonism, unspecified: Secondary | ICD-10-CM | POA: Diagnosis not present

## 2024-01-27 DIAGNOSIS — M109 Gout, unspecified: Secondary | ICD-10-CM | POA: Diagnosis not present

## 2024-01-27 DIAGNOSIS — E119 Type 2 diabetes mellitus without complications: Secondary | ICD-10-CM | POA: Diagnosis not present

## 2024-01-27 DIAGNOSIS — Z7982 Long term (current) use of aspirin: Secondary | ICD-10-CM | POA: Diagnosis not present

## 2024-01-27 NOTE — Telephone Encounter (Signed)
-----   Message from [name] sent at 01/26/2024  3:45 PM EDT ----- Call patient.  Good news, urine test looked okay and the urine culture did not indicate infection. Prostate test was normal.  Cholesterol levels were normal.  Blood sugar elevated at 145, other electrolytes were normal.  57-month blood sugar running too high at 7.9.  Anemia test was stable, infection fighting cells were slightly elevated.  If any fever or infection symptoms should be seen.  Based on blood sugars last visit let's hold on medication changes for diabetes but monitor levels at home and if those are in the 200s, let me know and we will make some changes.  Let me know if there are questions.

## 2024-01-27 NOTE — Telephone Encounter (Signed)
 Lab results have been discussed.   Verbalized understanding? Yes  Are there any questions? No

## 2024-01-29 DIAGNOSIS — Z7984 Long term (current) use of oral hypoglycemic drugs: Secondary | ICD-10-CM | POA: Diagnosis not present

## 2024-01-29 DIAGNOSIS — E785 Hyperlipidemia, unspecified: Secondary | ICD-10-CM | POA: Diagnosis not present

## 2024-01-29 DIAGNOSIS — Z794 Long term (current) use of insulin: Secondary | ICD-10-CM | POA: Diagnosis not present

## 2024-01-29 DIAGNOSIS — G9341 Metabolic encephalopathy: Secondary | ICD-10-CM | POA: Diagnosis not present

## 2024-01-29 DIAGNOSIS — M109 Gout, unspecified: Secondary | ICD-10-CM | POA: Diagnosis not present

## 2024-01-29 DIAGNOSIS — I10 Essential (primary) hypertension: Secondary | ICD-10-CM | POA: Diagnosis not present

## 2024-01-29 DIAGNOSIS — G20C Parkinsonism, unspecified: Secondary | ICD-10-CM | POA: Diagnosis not present

## 2024-01-29 DIAGNOSIS — Z7982 Long term (current) use of aspirin: Secondary | ICD-10-CM | POA: Diagnosis not present

## 2024-01-29 DIAGNOSIS — Z556 Problems related to health literacy: Secondary | ICD-10-CM | POA: Diagnosis not present

## 2024-01-29 DIAGNOSIS — E119 Type 2 diabetes mellitus without complications: Secondary | ICD-10-CM | POA: Diagnosis not present

## 2024-01-29 DIAGNOSIS — D638 Anemia in other chronic diseases classified elsewhere: Secondary | ICD-10-CM | POA: Diagnosis not present

## 2024-02-04 ENCOUNTER — Telehealth: Payer: Self-pay

## 2024-02-04 DIAGNOSIS — Z794 Long term (current) use of insulin: Secondary | ICD-10-CM | POA: Diagnosis not present

## 2024-02-04 DIAGNOSIS — G20C Parkinsonism, unspecified: Secondary | ICD-10-CM | POA: Diagnosis not present

## 2024-02-04 DIAGNOSIS — G9341 Metabolic encephalopathy: Secondary | ICD-10-CM | POA: Diagnosis not present

## 2024-02-04 DIAGNOSIS — Z556 Problems related to health literacy: Secondary | ICD-10-CM | POA: Diagnosis not present

## 2024-02-04 DIAGNOSIS — E119 Type 2 diabetes mellitus without complications: Secondary | ICD-10-CM | POA: Diagnosis not present

## 2024-02-04 DIAGNOSIS — I10 Essential (primary) hypertension: Secondary | ICD-10-CM | POA: Diagnosis not present

## 2024-02-04 DIAGNOSIS — Z7984 Long term (current) use of oral hypoglycemic drugs: Secondary | ICD-10-CM | POA: Diagnosis not present

## 2024-02-04 DIAGNOSIS — M109 Gout, unspecified: Secondary | ICD-10-CM | POA: Diagnosis not present

## 2024-02-04 DIAGNOSIS — E785 Hyperlipidemia, unspecified: Secondary | ICD-10-CM | POA: Diagnosis not present

## 2024-02-04 DIAGNOSIS — D638 Anemia in other chronic diseases classified elsewhere: Secondary | ICD-10-CM | POA: Diagnosis not present

## 2024-02-04 DIAGNOSIS — Z7982 Long term (current) use of aspirin: Secondary | ICD-10-CM | POA: Diagnosis not present

## 2024-02-04 NOTE — Telephone Encounter (Signed)
 Copied from CRM [id number]. Topic: General - Other >> Feb 04, 2024  2:09 PM Elizebeth Brooking wrote: Reason for CRM: Loraine Leriche with [facility] called to report that patient had a fall n Sunday from not getting close to the chair, no injuries and no trip to the er was required  If any questions callback number is [phone]

## 2024-02-04 NOTE — Telephone Encounter (Signed)
 Noted. Ok to continue to monitor for any symptoms and be seen if any pain or injury.

## 2024-02-04 NOTE — Telephone Encounter (Signed)
 Fyi about patients fall didn't get close enough to his chair, no injury reported no ED trip needed per home health assessment

## 2024-02-05 DIAGNOSIS — G9341 Metabolic encephalopathy: Secondary | ICD-10-CM | POA: Diagnosis not present

## 2024-02-05 DIAGNOSIS — G20C Parkinsonism, unspecified: Secondary | ICD-10-CM | POA: Diagnosis not present

## 2024-02-05 DIAGNOSIS — Z7984 Long term (current) use of oral hypoglycemic drugs: Secondary | ICD-10-CM | POA: Diagnosis not present

## 2024-02-05 DIAGNOSIS — Z7982 Long term (current) use of aspirin: Secondary | ICD-10-CM | POA: Diagnosis not present

## 2024-02-05 DIAGNOSIS — E119 Type 2 diabetes mellitus without complications: Secondary | ICD-10-CM | POA: Diagnosis not present

## 2024-02-05 DIAGNOSIS — I10 Essential (primary) hypertension: Secondary | ICD-10-CM | POA: Diagnosis not present

## 2024-02-05 DIAGNOSIS — M109 Gout, unspecified: Secondary | ICD-10-CM | POA: Diagnosis not present

## 2024-02-05 DIAGNOSIS — Z794 Long term (current) use of insulin: Secondary | ICD-10-CM | POA: Diagnosis not present

## 2024-02-05 DIAGNOSIS — E785 Hyperlipidemia, unspecified: Secondary | ICD-10-CM | POA: Diagnosis not present

## 2024-02-05 DIAGNOSIS — D638 Anemia in other chronic diseases classified elsewhere: Secondary | ICD-10-CM | POA: Diagnosis not present

## 2024-02-05 DIAGNOSIS — Z556 Problems related to health literacy: Secondary | ICD-10-CM | POA: Diagnosis not present

## 2024-02-09 ENCOUNTER — Telehealth: Payer: Self-pay

## 2024-02-09 DIAGNOSIS — G20C Parkinsonism, unspecified: Secondary | ICD-10-CM | POA: Diagnosis not present

## 2024-02-09 DIAGNOSIS — I10 Essential (primary) hypertension: Secondary | ICD-10-CM | POA: Diagnosis not present

## 2024-02-09 DIAGNOSIS — Z7984 Long term (current) use of oral hypoglycemic drugs: Secondary | ICD-10-CM | POA: Diagnosis not present

## 2024-02-09 DIAGNOSIS — E119 Type 2 diabetes mellitus without complications: Secondary | ICD-10-CM | POA: Diagnosis not present

## 2024-02-09 DIAGNOSIS — Z794 Long term (current) use of insulin: Secondary | ICD-10-CM | POA: Diagnosis not present

## 2024-02-09 DIAGNOSIS — E785 Hyperlipidemia, unspecified: Secondary | ICD-10-CM | POA: Diagnosis not present

## 2024-02-09 DIAGNOSIS — G9341 Metabolic encephalopathy: Secondary | ICD-10-CM | POA: Diagnosis not present

## 2024-02-09 DIAGNOSIS — M109 Gout, unspecified: Secondary | ICD-10-CM | POA: Diagnosis not present

## 2024-02-09 DIAGNOSIS — Z7982 Long term (current) use of aspirin: Secondary | ICD-10-CM | POA: Diagnosis not present

## 2024-02-09 DIAGNOSIS — D638 Anemia in other chronic diseases classified elsewhere: Secondary | ICD-10-CM | POA: Diagnosis not present

## 2024-02-09 DIAGNOSIS — Z556 Problems related to health literacy: Secondary | ICD-10-CM | POA: Diagnosis not present

## 2024-02-09 NOTE — Telephone Encounter (Signed)
 All from [facility]  caregiver at [employer]. She reports that since hospital stay for the flu patient has been droolling. She states the wife thinks it may have to do with the times the medication (sinemet ) is given ( 10, 2, 5, and bedtime) I reviewed chart and patient has been taking for 1 yr with no issues. Likely not mediation related but disease progression. Advised I would send to Dr. Teresa Coombs for review and advise/recommendations

## 2024-02-10 ENCOUNTER — Telehealth: Payer: Self-pay | Admitting: Family Medicine

## 2024-02-10 ENCOUNTER — Ambulatory Visit: Admitting: Podiatry

## 2024-02-10 ENCOUNTER — Encounter: Payer: Self-pay | Admitting: Podiatry

## 2024-02-10 VITALS — Ht 73.0 in | Wt 238.0 lb

## 2024-02-10 DIAGNOSIS — M2012 Hallux valgus (acquired), left foot: Secondary | ICD-10-CM | POA: Diagnosis not present

## 2024-02-10 DIAGNOSIS — M79674 Pain in right toe(s): Secondary | ICD-10-CM

## 2024-02-10 DIAGNOSIS — M79675 Pain in left toe(s): Secondary | ICD-10-CM

## 2024-02-10 DIAGNOSIS — M2011 Hallux valgus (acquired), right foot: Secondary | ICD-10-CM

## 2024-02-10 DIAGNOSIS — E119 Type 2 diabetes mellitus without complications: Secondary | ICD-10-CM | POA: Diagnosis not present

## 2024-02-10 DIAGNOSIS — B351 Tinea unguium: Secondary | ICD-10-CM | POA: Diagnosis not present

## 2024-02-10 NOTE — Telephone Encounter (Signed)
 Placed in your sign folder

## 2024-02-10 NOTE — Telephone Encounter (Signed)
 Paperwork completed and placed in fax bin at back nurse station

## 2024-02-10 NOTE — Telephone Encounter (Addendum)
 Home Health Certification or Plan of Care Tracking  **Total of 2 orders paperclipped together**  Is this a Certification or Plan of Care? Plan of Care  [facility] Agency: [facility]  Order Numbers: 1st Order # [id number] 2nd Order # [id number] 3rd Order # [id number] 4th Order # [id number]  Has charge sheet been attached? Yes  Where has form been placed:   Labeled & placed in provider bin

## 2024-02-11 DIAGNOSIS — E119 Type 2 diabetes mellitus without complications: Secondary | ICD-10-CM | POA: Diagnosis not present

## 2024-02-11 DIAGNOSIS — H25813 Combined forms of age-related cataract, bilateral: Secondary | ICD-10-CM | POA: Diagnosis not present

## 2024-02-11 DIAGNOSIS — H43813 Vitreous degeneration, bilateral: Secondary | ICD-10-CM | POA: Diagnosis not present

## 2024-02-11 DIAGNOSIS — H04123 Dry eye syndrome of bilateral lacrimal glands: Secondary | ICD-10-CM | POA: Diagnosis not present

## 2024-02-11 DIAGNOSIS — H02831 Dermatochalasis of right upper eyelid: Secondary | ICD-10-CM | POA: Diagnosis not present

## 2024-02-11 DIAGNOSIS — H02834 Dermatochalasis of left upper eyelid: Secondary | ICD-10-CM | POA: Diagnosis not present

## 2024-02-11 LAB — HM DIABETES EYE EXAM

## 2024-02-12 DIAGNOSIS — Z794 Long term (current) use of insulin: Secondary | ICD-10-CM | POA: Diagnosis not present

## 2024-02-12 DIAGNOSIS — G9341 Metabolic encephalopathy: Secondary | ICD-10-CM | POA: Diagnosis not present

## 2024-02-12 DIAGNOSIS — E119 Type 2 diabetes mellitus without complications: Secondary | ICD-10-CM | POA: Diagnosis not present

## 2024-02-12 DIAGNOSIS — Z7984 Long term (current) use of oral hypoglycemic drugs: Secondary | ICD-10-CM | POA: Diagnosis not present

## 2024-02-12 DIAGNOSIS — I10 Essential (primary) hypertension: Secondary | ICD-10-CM | POA: Diagnosis not present

## 2024-02-12 DIAGNOSIS — E785 Hyperlipidemia, unspecified: Secondary | ICD-10-CM | POA: Diagnosis not present

## 2024-02-12 DIAGNOSIS — M109 Gout, unspecified: Secondary | ICD-10-CM | POA: Diagnosis not present

## 2024-02-12 DIAGNOSIS — Z7982 Long term (current) use of aspirin: Secondary | ICD-10-CM | POA: Diagnosis not present

## 2024-02-12 DIAGNOSIS — D638 Anemia in other chronic diseases classified elsewhere: Secondary | ICD-10-CM | POA: Diagnosis not present

## 2024-02-12 DIAGNOSIS — G20C Parkinsonism, unspecified: Secondary | ICD-10-CM | POA: Diagnosis not present

## 2024-02-12 DIAGNOSIS — Z556 Problems related to health literacy: Secondary | ICD-10-CM | POA: Diagnosis not present

## 2024-02-14 ENCOUNTER — Encounter: Payer: Self-pay | Admitting: Podiatry

## 2024-02-14 NOTE — Progress Notes (Signed)
 ANNUAL DIABETIC FOOT EXAM  Subjective: Edward Briggs presents today for annual diabetic foot exam.  Chief Complaint  Patient presents with   Nail Problem    Pt is here for [facility] unsure of last A1C PCP is Dr Ester Helms and LOV was in March.   Patient confirms h/o diabetes.  Patient denies any h/o foot wounds.  Benjiman Bras, MD is patient's PCP.  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   Patient Active Problem List   Diagnosis Date Noted   Influenza A 12/17/2023   HTN (hypertension) 07/22/2019   Acute metabolic encephalopathy 07/22/2019   AKI (acute kidney injury) (HCC) 07/22/2019   Sepsis (HCC) 07/22/2019   UTI (urinary tract infection) 07/22/2019   Diabetes mellitus without complication (HCC) 09/10/2012   CHEST PAIN 07/21/2009   Past Surgical History:  Procedure Laterality Date   ABSCESS DRAINAGE     COLONOSCOPY     ~ 10 yrs ago   FRACTURE SURGERY     HARDWARE REMOVAL  03/26/2012   Procedure: HARDWARE REMOVAL;  Surgeon: Janifer Meigs, MD;  Location: [facility];  Service: Orthopedics;  Laterality: Left;  hardware removal deep left ankle syndesmotic screws only and stress xrays ankle    ORIF ANKLE FRACTURE  12/03/2011   Procedure: OPEN REDUCTION INTERNAL FIXATION (ORIF) ANKLE FRACTURE;  Surgeon: Janifer Meigs, MD;  Location: [facility];  Service: Orthopedics;  Laterality: Left;  left medial malleolus fracture   PILONIDAL CYST EXCISION  ~1982   SKIN GRAFT  ~1970   Lt great toe (graft from Lt thigh)   Current Outpatient Medications on File Prior to Visit  Medication Sig Dispense Refill   Accu-Chek Softclix Lancets lancets USE AS DIRECTED UP TO 4 TIMES DAILY 100 each 0   aspirin 81 MG tablet Take 81 mg by mouth every 7 (seven) days.     atorvastatin (LIPITOR) 10 MG  tablet Take 1 tablet (10 mg total) by mouth daily. 90 tablet 1   benzonatate (TESSALON) 200 MG capsule Take 1 capsule (200 mg total) by mouth 2 (two) times daily. 20 capsule 0   blood glucose meter kit and supplies Dispense based on patient and insurance preference. Use up to four times daily as directed. (FOR ICD-10 E10.9, E11.9). 1 each 0   Blood Glucose Monitoring Suppl (BLOOD GLUCOSE METER) kit Use as instructed 1 each 0   carbidopa-levodopa (SINEMET IR) 25-100 MG tablet Take 1 tablet by mouth 4 (four) times daily. (Patient taking differently: Take 1 tablet by mouth See admin instructions. Take 1 tablet by mouth at 11 AM, 2 PM, 5 PM, and at bedtime) 360 tablet 3   donepezil (ARICEPT) 10 MG tablet TAKE 1 TABLET BY MOUTH AT BEDTIME 90 tablet 0   glucose blood test strip Check blood sugar 3 times daily 100 each 4   insulin glargine (LANTUS) 100 UNIT/ML injection Inject 0.15 mLs (15 Units total) into the skin daily. 10 mL 3   Insulin Pen Needle (BD PEN NEEDLE NANO U/F) 32G X 4 MM MISC USE DAILY AS DIRECTED 100 each 2   LANTUS SOLOSTAR 100 UNIT/ML Solostar Pen INJECT 34 UNITS SUBCUTANEOUSLY ONCE DAILY 15 mL 0   lisinopril (ZESTRIL) 5 MG tablet Take 1 tablet by mouth once daily  90 tablet 0   Metformin HCl 500 MG/5ML SOLN TAKE 10 ML BY MOUTH  TWICE DAILY . APPOINTMENT REQUIRED FOR FUTURE REFILLS 473 mL 0   TYLENOL 500 MG tablet Take 500-1,000 mg by mouth every 6 (six) hours as needed for mild pain (pain score 1-3) or headache.     No current facility-administered medications on file prior to visit.    No Known Allergies Social History   Occupational History   Occupation: retired  Tobacco Use   Smoking status: Former    Current packs/day: 0.00    Types: Cigarettes    Quit date: 10/30/1988    Years since quitting: 35.3   Smokeless tobacco: Never  Vaping Use   Vaping status: Never Used  Substance and Sexual Activity   Alcohol use: Yes    Alcohol/week: 1.0 standard drink of alcohol     Types: 1 Shots of liquor per week    Comment: every two weeks   Drug use: No   Sexual activity: Yes    Birth control/protection: None   Family History  Problem Relation Age of Onset   Cancer Mother    Cancer Father    Colon cancer Neg Hx    Colon polyps Neg Hx    Esophageal cancer Neg Hx    Rectal cancer Neg Hx    Stomach cancer Neg Hx    Immunization History  Administered Date(s) Administered   PFIZER Comirnaty(Gray Top)Covid-19 Tri-Sucrose Vaccine 12/16/2019, 01/10/2020, 08/31/2020   Pneumococcal Conjugate-13 04/23/2017   Pneumococcal Polysaccharide-23 12/07/2018   Tdap 03/13/2023     Review of Systems: Negative except as noted in the HPI.   Objective: There were no vitals filed for this visit.  Edward Briggs is a pleasant 73 y.o. male in NAD. AAO X 3.  Diabetic foot exam was performed with the following findings:   Normal sensation of 10g monofilament Intact posterior tibialis and dorsalis pedis pulses Vascular Examination: Capillary refill time immediate b/l. Vascular status intact b/l with palpable pedal pulses. Pedal hair present b/l. No pain with calf compression b/l. Skin temperature gradient WNL b/l. No cyanosis or clubbing b/l. No ischemia or gangrene noted b/l.   Neurological Examination: Sensation grossly intact b/l with 10 gram monofilament. Vibratory sensation intact b/l.   Dermatological Examination: Pedal skin with normal turgor, texture and tone b/l.  No open wounds. No interdigital macerations.   Toenails 1-5 b/l thick, discolored, elongated with subungual debris and pain on dorsal palpation.   No hyperkeratotic nor porokeratotic lesions present on today's visit.  Musculoskeletal Examination: Muscle strength 5/5 to all lower extremity muscle groups bilaterally. HAV with bunion deformity noted b/l LE.  Radiographs: None     Lab Results  Component Value Date   HGBA1C 7.9 (H) 01/21/2024   ADA Risk Categorization: Low Risk :  Patient has all  of the following: Intact protective sensation No prior foot ulcer  No severe deformity Pedal pulses present  Assessment: 1. Pain due to onychomycosis of toenails of both feet   2. Hallux valgus, acquired, bilateral   3. Diabetes mellitus without complication (HCC)   4. Encounter for diabetic foot exam (HCC)     Plan: Diabetic foot examination performed today.  All patient's and/or POA's questions/concerns addressed on today's visit. Mycotic toenails 1-5 debrided in length and girth without incident. Continue daily foot inspections and monitor blood glucose per PCP/Endocrinologist's recommendations. Continue soft, supportive shoe gear daily. Report any pedal injuries to medical professional. Call office if there are any  questions/concerns. -Patient/POA to call should there be question/concern in the interim. Return in about 3 months (around 05/11/2024).  Luella Sager, DPM      [facility] LOCATION: [address], [place] [address]                   Office [phone]   [facility] LOCATION: [address] [place], [place] [address] Office [phone]

## 2024-02-15 DIAGNOSIS — Z794 Long term (current) use of insulin: Secondary | ICD-10-CM | POA: Diagnosis not present

## 2024-02-15 DIAGNOSIS — D638 Anemia in other chronic diseases classified elsewhere: Secondary | ICD-10-CM | POA: Diagnosis not present

## 2024-02-15 DIAGNOSIS — Z7982 Long term (current) use of aspirin: Secondary | ICD-10-CM | POA: Diagnosis not present

## 2024-02-15 DIAGNOSIS — E119 Type 2 diabetes mellitus without complications: Secondary | ICD-10-CM | POA: Diagnosis not present

## 2024-02-15 DIAGNOSIS — G20C Parkinsonism, unspecified: Secondary | ICD-10-CM | POA: Diagnosis not present

## 2024-02-15 DIAGNOSIS — G9341 Metabolic encephalopathy: Secondary | ICD-10-CM | POA: Diagnosis not present

## 2024-02-15 DIAGNOSIS — Z7984 Long term (current) use of oral hypoglycemic drugs: Secondary | ICD-10-CM | POA: Diagnosis not present

## 2024-02-15 DIAGNOSIS — Z556 Problems related to health literacy: Secondary | ICD-10-CM | POA: Diagnosis not present

## 2024-02-15 DIAGNOSIS — M109 Gout, unspecified: Secondary | ICD-10-CM | POA: Diagnosis not present

## 2024-02-15 DIAGNOSIS — E785 Hyperlipidemia, unspecified: Secondary | ICD-10-CM | POA: Diagnosis not present

## 2024-02-15 DIAGNOSIS — I10 Essential (primary) hypertension: Secondary | ICD-10-CM | POA: Diagnosis not present

## 2024-02-16 ENCOUNTER — Telehealth: Payer: Self-pay

## 2024-02-16 DIAGNOSIS — E785 Hyperlipidemia, unspecified: Secondary | ICD-10-CM | POA: Diagnosis not present

## 2024-02-16 DIAGNOSIS — I10 Essential (primary) hypertension: Secondary | ICD-10-CM | POA: Diagnosis not present

## 2024-02-16 DIAGNOSIS — D638 Anemia in other chronic diseases classified elsewhere: Secondary | ICD-10-CM | POA: Diagnosis not present

## 2024-02-16 DIAGNOSIS — Z556 Problems related to health literacy: Secondary | ICD-10-CM | POA: Diagnosis not present

## 2024-02-16 DIAGNOSIS — G20C Parkinsonism, unspecified: Secondary | ICD-10-CM | POA: Diagnosis not present

## 2024-02-16 DIAGNOSIS — Z7982 Long term (current) use of aspirin: Secondary | ICD-10-CM | POA: Diagnosis not present

## 2024-02-16 DIAGNOSIS — M109 Gout, unspecified: Secondary | ICD-10-CM | POA: Diagnosis not present

## 2024-02-16 DIAGNOSIS — Z7984 Long term (current) use of oral hypoglycemic drugs: Secondary | ICD-10-CM | POA: Diagnosis not present

## 2024-02-16 DIAGNOSIS — G9341 Metabolic encephalopathy: Secondary | ICD-10-CM | POA: Diagnosis not present

## 2024-02-16 DIAGNOSIS — E119 Type 2 diabetes mellitus without complications: Secondary | ICD-10-CM | POA: Diagnosis not present

## 2024-02-16 DIAGNOSIS — Z794 Long term (current) use of insulin: Secondary | ICD-10-CM | POA: Diagnosis not present

## 2024-02-16 NOTE — Telephone Encounter (Signed)
**Note De-identified  [place] Obfuscation** Please advise 

## 2024-02-16 NOTE — Telephone Encounter (Signed)
 Copied from CRM [id number]. Topic: Clinical - Medical Advice >> Feb 16, 2024  3:45 PM Edward Briggs wrote: Reason for CRM: Patient wanted to know if he can get a prescription for depends due to incontinence.

## 2024-02-17 MED ORDER — DEPEND EASY FIT UNDERGARMENTS MISC: 1.0000 | 6 refills | Status: DC | PRN

## 2024-02-17 NOTE — Telephone Encounter (Signed)
 Spoke with wife Carles Cheadle and she states he is not having new sx.

## 2024-02-17 NOTE — Telephone Encounter (Signed)
 Prescribed to [employer].  Please make sure this is not a new symptom.  I do see on prior FL 2 incontinence was listed.  If any new symptoms, please schedule visit.

## 2024-02-18 ENCOUNTER — Other Ambulatory Visit: Payer: Self-pay | Admitting: Neurology

## 2024-02-23 ENCOUNTER — Telehealth: Payer: Self-pay

## 2024-02-23 DIAGNOSIS — D638 Anemia in other chronic diseases classified elsewhere: Secondary | ICD-10-CM | POA: Diagnosis not present

## 2024-02-23 DIAGNOSIS — G9341 Metabolic encephalopathy: Secondary | ICD-10-CM | POA: Diagnosis not present

## 2024-02-23 DIAGNOSIS — Z556 Problems related to health literacy: Secondary | ICD-10-CM | POA: Diagnosis not present

## 2024-02-23 DIAGNOSIS — E119 Type 2 diabetes mellitus without complications: Secondary | ICD-10-CM | POA: Diagnosis not present

## 2024-02-23 DIAGNOSIS — I10 Essential (primary) hypertension: Secondary | ICD-10-CM | POA: Diagnosis not present

## 2024-02-23 DIAGNOSIS — M109 Gout, unspecified: Secondary | ICD-10-CM | POA: Diagnosis not present

## 2024-02-23 DIAGNOSIS — Z7982 Long term (current) use of aspirin: Secondary | ICD-10-CM | POA: Diagnosis not present

## 2024-02-23 DIAGNOSIS — E785 Hyperlipidemia, unspecified: Secondary | ICD-10-CM | POA: Diagnosis not present

## 2024-02-23 DIAGNOSIS — G20C Parkinsonism, unspecified: Secondary | ICD-10-CM | POA: Diagnosis not present

## 2024-02-23 DIAGNOSIS — Z7984 Long term (current) use of oral hypoglycemic drugs: Secondary | ICD-10-CM | POA: Diagnosis not present

## 2024-02-23 DIAGNOSIS — Z794 Long term (current) use of insulin: Secondary | ICD-10-CM | POA: Diagnosis not present

## 2024-02-23 NOTE — Telephone Encounter (Signed)
 Copied from CRM [id number]. Topic: Clinical - Medical Advice >> Feb 23, 2024  4:13 PM Albertha Alosa wrote: Reason for CRM: Starling Eck from [facility] called in needing to speak to someone that can confirm patient chronic condition callback number is [phone]  Reference number : [id number]

## 2024-02-23 NOTE — Telephone Encounter (Signed)
 Patient verification already completed

## 2024-02-24 ENCOUNTER — Telehealth: Payer: Self-pay | Admitting: Family Medicine

## 2024-02-24 DIAGNOSIS — Z7984 Long term (current) use of oral hypoglycemic drugs: Secondary | ICD-10-CM | POA: Diagnosis not present

## 2024-02-24 DIAGNOSIS — Z794 Long term (current) use of insulin: Secondary | ICD-10-CM | POA: Diagnosis not present

## 2024-02-24 DIAGNOSIS — E785 Hyperlipidemia, unspecified: Secondary | ICD-10-CM | POA: Diagnosis not present

## 2024-02-24 DIAGNOSIS — D638 Anemia in other chronic diseases classified elsewhere: Secondary | ICD-10-CM | POA: Diagnosis not present

## 2024-02-24 DIAGNOSIS — M109 Gout, unspecified: Secondary | ICD-10-CM | POA: Diagnosis not present

## 2024-02-24 DIAGNOSIS — E119 Type 2 diabetes mellitus without complications: Secondary | ICD-10-CM | POA: Diagnosis not present

## 2024-02-24 DIAGNOSIS — G9341 Metabolic encephalopathy: Secondary | ICD-10-CM | POA: Diagnosis not present

## 2024-02-24 DIAGNOSIS — Z7982 Long term (current) use of aspirin: Secondary | ICD-10-CM | POA: Diagnosis not present

## 2024-02-24 DIAGNOSIS — I10 Essential (primary) hypertension: Secondary | ICD-10-CM | POA: Diagnosis not present

## 2024-02-24 DIAGNOSIS — G20C Parkinsonism, unspecified: Secondary | ICD-10-CM | POA: Diagnosis not present

## 2024-02-24 DIAGNOSIS — Z556 Problems related to health literacy: Secondary | ICD-10-CM | POA: Diagnosis not present

## 2024-02-24 NOTE — Telephone Encounter (Signed)
 Paper work was sent from [facility] and needs signatures and sent back to its designated place. Paper work is placed in Chartered loss adjuster.  Please advise, Thanks

## 2024-02-25 NOTE — Telephone Encounter (Signed)
 Paperwork completed and placed in fax bin at back nurse station

## 2024-02-25 NOTE — Telephone Encounter (Signed)
 Placed in your sign folder

## 2024-03-01 DIAGNOSIS — Z556 Problems related to health literacy: Secondary | ICD-10-CM | POA: Diagnosis not present

## 2024-03-01 DIAGNOSIS — E785 Hyperlipidemia, unspecified: Secondary | ICD-10-CM | POA: Diagnosis not present

## 2024-03-01 DIAGNOSIS — Z794 Long term (current) use of insulin: Secondary | ICD-10-CM | POA: Diagnosis not present

## 2024-03-01 DIAGNOSIS — E119 Type 2 diabetes mellitus without complications: Secondary | ICD-10-CM | POA: Diagnosis not present

## 2024-03-01 DIAGNOSIS — Z7982 Long term (current) use of aspirin: Secondary | ICD-10-CM | POA: Diagnosis not present

## 2024-03-01 DIAGNOSIS — Z7984 Long term (current) use of oral hypoglycemic drugs: Secondary | ICD-10-CM | POA: Diagnosis not present

## 2024-03-01 DIAGNOSIS — M109 Gout, unspecified: Secondary | ICD-10-CM | POA: Diagnosis not present

## 2024-03-01 DIAGNOSIS — D638 Anemia in other chronic diseases classified elsewhere: Secondary | ICD-10-CM | POA: Diagnosis not present

## 2024-03-01 DIAGNOSIS — G20C Parkinsonism, unspecified: Secondary | ICD-10-CM | POA: Diagnosis not present

## 2024-03-01 DIAGNOSIS — G9341 Metabolic encephalopathy: Secondary | ICD-10-CM | POA: Diagnosis not present

## 2024-03-01 DIAGNOSIS — I10 Essential (primary) hypertension: Secondary | ICD-10-CM | POA: Diagnosis not present

## 2024-03-02 DIAGNOSIS — Z556 Problems related to health literacy: Secondary | ICD-10-CM | POA: Diagnosis not present

## 2024-03-02 DIAGNOSIS — Z794 Long term (current) use of insulin: Secondary | ICD-10-CM | POA: Diagnosis not present

## 2024-03-02 DIAGNOSIS — G20C Parkinsonism, unspecified: Secondary | ICD-10-CM | POA: Diagnosis not present

## 2024-03-02 DIAGNOSIS — E119 Type 2 diabetes mellitus without complications: Secondary | ICD-10-CM | POA: Diagnosis not present

## 2024-03-02 DIAGNOSIS — E785 Hyperlipidemia, unspecified: Secondary | ICD-10-CM | POA: Diagnosis not present

## 2024-03-02 DIAGNOSIS — Z7984 Long term (current) use of oral hypoglycemic drugs: Secondary | ICD-10-CM | POA: Diagnosis not present

## 2024-03-02 DIAGNOSIS — I10 Essential (primary) hypertension: Secondary | ICD-10-CM | POA: Diagnosis not present

## 2024-03-02 DIAGNOSIS — D638 Anemia in other chronic diseases classified elsewhere: Secondary | ICD-10-CM | POA: Diagnosis not present

## 2024-03-02 DIAGNOSIS — Z7982 Long term (current) use of aspirin: Secondary | ICD-10-CM | POA: Diagnosis not present

## 2024-03-02 DIAGNOSIS — G9341 Metabolic encephalopathy: Secondary | ICD-10-CM | POA: Diagnosis not present

## 2024-03-02 DIAGNOSIS — M109 Gout, unspecified: Secondary | ICD-10-CM | POA: Diagnosis not present

## 2024-03-09 ENCOUNTER — Other Ambulatory Visit: Payer: Self-pay | Admitting: Family Medicine

## 2024-03-09 DIAGNOSIS — E1165 Type 2 diabetes mellitus with hyperglycemia: Secondary | ICD-10-CM

## 2024-03-10 ENCOUNTER — Ambulatory Visit: Payer: Self-pay | Admitting: Neurology

## 2024-03-11 DIAGNOSIS — G20C Parkinsonism, unspecified: Secondary | ICD-10-CM | POA: Diagnosis not present

## 2024-03-11 DIAGNOSIS — M109 Gout, unspecified: Secondary | ICD-10-CM | POA: Diagnosis not present

## 2024-03-11 DIAGNOSIS — Z7984 Long term (current) use of oral hypoglycemic drugs: Secondary | ICD-10-CM | POA: Diagnosis not present

## 2024-03-11 DIAGNOSIS — G9341 Metabolic encephalopathy: Secondary | ICD-10-CM | POA: Diagnosis not present

## 2024-03-11 DIAGNOSIS — Z794 Long term (current) use of insulin: Secondary | ICD-10-CM | POA: Diagnosis not present

## 2024-03-11 DIAGNOSIS — D638 Anemia in other chronic diseases classified elsewhere: Secondary | ICD-10-CM | POA: Diagnosis not present

## 2024-03-11 DIAGNOSIS — I10 Essential (primary) hypertension: Secondary | ICD-10-CM | POA: Diagnosis not present

## 2024-03-11 DIAGNOSIS — E785 Hyperlipidemia, unspecified: Secondary | ICD-10-CM | POA: Diagnosis not present

## 2024-03-11 DIAGNOSIS — Z556 Problems related to health literacy: Secondary | ICD-10-CM | POA: Diagnosis not present

## 2024-03-11 DIAGNOSIS — E119 Type 2 diabetes mellitus without complications: Secondary | ICD-10-CM | POA: Diagnosis not present

## 2024-03-11 DIAGNOSIS — Z7982 Long term (current) use of aspirin: Secondary | ICD-10-CM | POA: Diagnosis not present

## 2024-03-16 DIAGNOSIS — G9341 Metabolic encephalopathy: Secondary | ICD-10-CM | POA: Diagnosis not present

## 2024-03-16 DIAGNOSIS — Z9181 History of falling: Secondary | ICD-10-CM | POA: Diagnosis not present

## 2024-03-16 DIAGNOSIS — I10 Essential (primary) hypertension: Secondary | ICD-10-CM | POA: Diagnosis not present

## 2024-03-16 DIAGNOSIS — E119 Type 2 diabetes mellitus without complications: Secondary | ICD-10-CM | POA: Diagnosis not present

## 2024-03-16 DIAGNOSIS — D638 Anemia in other chronic diseases classified elsewhere: Secondary | ICD-10-CM | POA: Diagnosis not present

## 2024-03-16 DIAGNOSIS — G20A1 Parkinson's disease without dyskinesia, without mention of fluctuations: Secondary | ICD-10-CM | POA: Diagnosis not present

## 2024-03-16 DIAGNOSIS — Z7982 Long term (current) use of aspirin: Secondary | ICD-10-CM | POA: Diagnosis not present

## 2024-03-16 DIAGNOSIS — Z7984 Long term (current) use of oral hypoglycemic drugs: Secondary | ICD-10-CM | POA: Diagnosis not present

## 2024-03-16 DIAGNOSIS — E785 Hyperlipidemia, unspecified: Secondary | ICD-10-CM | POA: Diagnosis not present

## 2024-03-16 DIAGNOSIS — Z556 Problems related to health literacy: Secondary | ICD-10-CM | POA: Diagnosis not present

## 2024-03-16 DIAGNOSIS — M109 Gout, unspecified: Secondary | ICD-10-CM | POA: Diagnosis not present

## 2024-03-16 DIAGNOSIS — Z794 Long term (current) use of insulin: Secondary | ICD-10-CM | POA: Diagnosis not present

## 2024-03-17 DIAGNOSIS — I10 Essential (primary) hypertension: Secondary | ICD-10-CM | POA: Diagnosis not present

## 2024-03-17 DIAGNOSIS — Z7984 Long term (current) use of oral hypoglycemic drugs: Secondary | ICD-10-CM | POA: Diagnosis not present

## 2024-03-17 DIAGNOSIS — Z7982 Long term (current) use of aspirin: Secondary | ICD-10-CM | POA: Diagnosis not present

## 2024-03-17 DIAGNOSIS — D638 Anemia in other chronic diseases classified elsewhere: Secondary | ICD-10-CM | POA: Diagnosis not present

## 2024-03-17 DIAGNOSIS — Z794 Long term (current) use of insulin: Secondary | ICD-10-CM | POA: Diagnosis not present

## 2024-03-17 DIAGNOSIS — E119 Type 2 diabetes mellitus without complications: Secondary | ICD-10-CM | POA: Diagnosis not present

## 2024-03-17 DIAGNOSIS — G20A1 Parkinson's disease without dyskinesia, without mention of fluctuations: Secondary | ICD-10-CM | POA: Diagnosis not present

## 2024-03-17 DIAGNOSIS — M109 Gout, unspecified: Secondary | ICD-10-CM | POA: Diagnosis not present

## 2024-03-17 DIAGNOSIS — Z9181 History of falling: Secondary | ICD-10-CM | POA: Diagnosis not present

## 2024-03-17 DIAGNOSIS — Z556 Problems related to health literacy: Secondary | ICD-10-CM | POA: Diagnosis not present

## 2024-03-17 DIAGNOSIS — G9341 Metabolic encephalopathy: Secondary | ICD-10-CM | POA: Diagnosis not present

## 2024-03-17 DIAGNOSIS — E785 Hyperlipidemia, unspecified: Secondary | ICD-10-CM | POA: Diagnosis not present

## 2024-03-22 ENCOUNTER — Telehealth: Payer: Self-pay | Admitting: Family Medicine

## 2024-03-22 NOTE — Telephone Encounter (Signed)
Placed in your sign folder at nurse station.

## 2024-03-22 NOTE — Telephone Encounter (Signed)
 Home health orders was sent from [facility] and needs signatures and sent back to its designated place. Paper work is placed in Chartered loss adjuster.  Please advise, Thanks

## 2024-03-23 DIAGNOSIS — D638 Anemia in other chronic diseases classified elsewhere: Secondary | ICD-10-CM | POA: Diagnosis not present

## 2024-03-23 DIAGNOSIS — E119 Type 2 diabetes mellitus without complications: Secondary | ICD-10-CM | POA: Diagnosis not present

## 2024-03-23 DIAGNOSIS — I10 Essential (primary) hypertension: Secondary | ICD-10-CM | POA: Diagnosis not present

## 2024-03-23 DIAGNOSIS — Z7984 Long term (current) use of oral hypoglycemic drugs: Secondary | ICD-10-CM | POA: Diagnosis not present

## 2024-03-23 DIAGNOSIS — Z794 Long term (current) use of insulin: Secondary | ICD-10-CM | POA: Diagnosis not present

## 2024-03-23 DIAGNOSIS — Z556 Problems related to health literacy: Secondary | ICD-10-CM | POA: Diagnosis not present

## 2024-03-23 DIAGNOSIS — G20A1 Parkinson's disease without dyskinesia, without mention of fluctuations: Secondary | ICD-10-CM | POA: Diagnosis not present

## 2024-03-23 DIAGNOSIS — Z7982 Long term (current) use of aspirin: Secondary | ICD-10-CM | POA: Diagnosis not present

## 2024-03-23 DIAGNOSIS — Z9181 History of falling: Secondary | ICD-10-CM | POA: Diagnosis not present

## 2024-03-23 DIAGNOSIS — E785 Hyperlipidemia, unspecified: Secondary | ICD-10-CM | POA: Diagnosis not present

## 2024-03-23 DIAGNOSIS — M109 Gout, unspecified: Secondary | ICD-10-CM | POA: Diagnosis not present

## 2024-03-23 DIAGNOSIS — G9341 Metabolic encephalopathy: Secondary | ICD-10-CM | POA: Diagnosis not present

## 2024-03-23 NOTE — Telephone Encounter (Signed)
 Paperwork completed and placed in fax bin at back nurse station

## 2024-03-23 NOTE — Telephone Encounter (Signed)
 Faxed back.

## 2024-03-29 ENCOUNTER — Ambulatory Visit: Admitting: Podiatry

## 2024-03-30 DIAGNOSIS — Z7982 Long term (current) use of aspirin: Secondary | ICD-10-CM | POA: Diagnosis not present

## 2024-03-30 DIAGNOSIS — E785 Hyperlipidemia, unspecified: Secondary | ICD-10-CM | POA: Diagnosis not present

## 2024-03-30 DIAGNOSIS — D638 Anemia in other chronic diseases classified elsewhere: Secondary | ICD-10-CM | POA: Diagnosis not present

## 2024-03-30 DIAGNOSIS — Z556 Problems related to health literacy: Secondary | ICD-10-CM | POA: Diagnosis not present

## 2024-03-30 DIAGNOSIS — Z7984 Long term (current) use of oral hypoglycemic drugs: Secondary | ICD-10-CM | POA: Diagnosis not present

## 2024-03-30 DIAGNOSIS — G20A1 Parkinson's disease without dyskinesia, without mention of fluctuations: Secondary | ICD-10-CM | POA: Diagnosis not present

## 2024-03-30 DIAGNOSIS — Z794 Long term (current) use of insulin: Secondary | ICD-10-CM | POA: Diagnosis not present

## 2024-03-30 DIAGNOSIS — Z9181 History of falling: Secondary | ICD-10-CM | POA: Diagnosis not present

## 2024-03-30 DIAGNOSIS — I10 Essential (primary) hypertension: Secondary | ICD-10-CM | POA: Diagnosis not present

## 2024-03-30 DIAGNOSIS — E119 Type 2 diabetes mellitus without complications: Secondary | ICD-10-CM | POA: Diagnosis not present

## 2024-03-30 DIAGNOSIS — G9341 Metabolic encephalopathy: Secondary | ICD-10-CM | POA: Diagnosis not present

## 2024-03-30 DIAGNOSIS — M109 Gout, unspecified: Secondary | ICD-10-CM | POA: Diagnosis not present

## 2024-03-31 ENCOUNTER — Other Ambulatory Visit: Payer: Self-pay | Admitting: Family Medicine

## 2024-03-31 DIAGNOSIS — E1165 Type 2 diabetes mellitus with hyperglycemia: Secondary | ICD-10-CM

## 2024-03-31 DIAGNOSIS — Z9189 Other specified personal risk factors, not elsewhere classified: Secondary | ICD-10-CM

## 2024-04-01 DIAGNOSIS — G20A1 Parkinson's disease without dyskinesia, without mention of fluctuations: Secondary | ICD-10-CM | POA: Diagnosis not present

## 2024-04-01 DIAGNOSIS — D638 Anemia in other chronic diseases classified elsewhere: Secondary | ICD-10-CM | POA: Diagnosis not present

## 2024-04-01 DIAGNOSIS — E119 Type 2 diabetes mellitus without complications: Secondary | ICD-10-CM | POA: Diagnosis not present

## 2024-04-01 DIAGNOSIS — Z556 Problems related to health literacy: Secondary | ICD-10-CM | POA: Diagnosis not present

## 2024-04-01 DIAGNOSIS — Z794 Long term (current) use of insulin: Secondary | ICD-10-CM | POA: Diagnosis not present

## 2024-04-01 DIAGNOSIS — I10 Essential (primary) hypertension: Secondary | ICD-10-CM | POA: Diagnosis not present

## 2024-04-01 DIAGNOSIS — E785 Hyperlipidemia, unspecified: Secondary | ICD-10-CM | POA: Diagnosis not present

## 2024-04-01 DIAGNOSIS — Z9181 History of falling: Secondary | ICD-10-CM | POA: Diagnosis not present

## 2024-04-01 DIAGNOSIS — Z7984 Long term (current) use of oral hypoglycemic drugs: Secondary | ICD-10-CM | POA: Diagnosis not present

## 2024-04-01 DIAGNOSIS — M109 Gout, unspecified: Secondary | ICD-10-CM | POA: Diagnosis not present

## 2024-04-01 DIAGNOSIS — Z7982 Long term (current) use of aspirin: Secondary | ICD-10-CM | POA: Diagnosis not present

## 2024-04-01 DIAGNOSIS — G9341 Metabolic encephalopathy: Secondary | ICD-10-CM | POA: Diagnosis not present

## 2024-04-01 NOTE — Telephone Encounter (Signed)
 Would you like this to be sent in? Her last lipid panel looks like it is more on the lower end. If you'd like me to send this in let me know and I can get it sent over.

## 2024-04-02 ENCOUNTER — Other Ambulatory Visit: Payer: Self-pay | Admitting: Neurology

## 2024-04-04 ENCOUNTER — Encounter: Payer: Self-pay | Admitting: Neurology

## 2024-04-04 ENCOUNTER — Ambulatory Visit: Payer: Self-pay | Admitting: Neurology

## 2024-04-04 VITALS — BP 132/77 | HR 78 | Ht 73.0 in

## 2024-04-04 DIAGNOSIS — M109 Gout, unspecified: Secondary | ICD-10-CM | POA: Diagnosis not present

## 2024-04-04 DIAGNOSIS — E119 Type 2 diabetes mellitus without complications: Secondary | ICD-10-CM | POA: Diagnosis not present

## 2024-04-04 DIAGNOSIS — E785 Hyperlipidemia, unspecified: Secondary | ICD-10-CM | POA: Diagnosis not present

## 2024-04-04 DIAGNOSIS — I10 Essential (primary) hypertension: Secondary | ICD-10-CM | POA: Diagnosis not present

## 2024-04-04 DIAGNOSIS — Z7982 Long term (current) use of aspirin: Secondary | ICD-10-CM | POA: Diagnosis not present

## 2024-04-04 DIAGNOSIS — D638 Anemia in other chronic diseases classified elsewhere: Secondary | ICD-10-CM | POA: Diagnosis not present

## 2024-04-04 DIAGNOSIS — G20A1 Parkinson's disease without dyskinesia, without mention of fluctuations: Secondary | ICD-10-CM

## 2024-04-04 DIAGNOSIS — G9341 Metabolic encephalopathy: Secondary | ICD-10-CM | POA: Diagnosis not present

## 2024-04-04 DIAGNOSIS — Z794 Long term (current) use of insulin: Secondary | ICD-10-CM | POA: Diagnosis not present

## 2024-04-04 DIAGNOSIS — Z556 Problems related to health literacy: Secondary | ICD-10-CM | POA: Diagnosis not present

## 2024-04-04 DIAGNOSIS — R498 Other voice and resonance disorders: Secondary | ICD-10-CM | POA: Diagnosis not present

## 2024-04-04 DIAGNOSIS — Z9181 History of falling: Secondary | ICD-10-CM | POA: Diagnosis not present

## 2024-04-04 DIAGNOSIS — Z7984 Long term (current) use of oral hypoglycemic drugs: Secondary | ICD-10-CM | POA: Diagnosis not present

## 2024-04-04 DIAGNOSIS — K117 Disturbances of salivary secretion: Secondary | ICD-10-CM | POA: Diagnosis not present

## 2024-04-04 MED ORDER — CARBIDOPA-LEVODOPA 25-100 MG PO TABS: 1.0000 | ORAL_TABLET | Freq: Four times a day (QID) | ORAL | 3 refills | Status: AC

## 2024-04-04 MED ORDER — AMITRIPTYLINE HCL 10 MG PO TABS: 10.0000 mg | ORAL_TABLET | Freq: Every day | ORAL | 0 refills | Status: DC

## 2024-04-04 NOTE — Patient Instructions (Signed)
 Continue with Sinemet  25/100 4 times daily  Trial of Amitriptyline for Sialorrhea  Please increase physical activity, consider joining the gym  Return in a year or sooner if worse

## 2024-04-04 NOTE — Progress Notes (Signed)
 [facility]  PATIENT: Edward Briggs DOB: 1951/02/03  REFERRING CLINICIAN: Benjiman Bras, MD HISTORY FROM: From patient and wife [name] Kovacic  REASON FOR VISIT: Memory problems    HISTORICAL  CHIEF COMPLAINT:  Chief Complaint  Patient presents with   Follow-up    RM 13. Follow Up.  Parkinson's:  GAIT: pt and wife report feet were very swollen for about 2 months ice helped. Going to foot dr tomorrow. Pt having troule putting shoes.  Pt's wife reports drooling for about 3 months.     INTERVAL HISTORY 04/04/2024:  Patient presents today for follow-up, he is accompanied by wife.  Last visit was a year ago.  At that time we obtained the DaTscan  which confirmed Parkinson disease.  Patient was continued on Sinemet  25/100 4 times daily.  Wife tells me that he is doing better, still use walker with ambulation outside the house.  In February he was admitted to the hospital due to flu which make his Parkinson symptoms worse.  Wife tells me for the past 3 months patient has been having excessive drooling. They denied recent falls. He is doing home PT and OT.    INTERVAL HISTORY 03/05/2023:  Patient presents today for follow-up, he is accompanied by wife.  Last visit was in January.  At that time, he was diagnosed with parkinsonism/Parkinson disease and patient was referred to physical therapy.  We have also started him on Sinemet  25/100 3 times daily.  Wife reports there is some improvement with the Sinemet  and physical therapy but is not a huge improvement.  She feels that his gait is better, no recent falls.  Overall he is doing better in general    INTERVAL HISTORY 12/01/2022:  Patient presents today for follow-up, he is accompanied by wife. Since last visit, they reported he is stable.  He does have some good days and and some bad days.  Wife reports that sometimes he is forgetful but overall he is doing well, still independent.  He is compliant with the Aricept ,  denies any side effect from the medicine.  Wife reported patient does not exercise.  They deny any fall, and no REM sleep behavior.   HISTORY OF PRESENT ILLNESS:  This is a 73 year old gentleman with past medical history of diabetes, hypertension, hyperlipidemia who is presenting for memory problem.  Patient states that "He does not feel too bad about his memory".  He reports that he feels a little more forgetful lately.  He reported he is still independent, able to carry activities of daily living but still a little forgetful.  Wife reports that patient does not usually remember what they have been talking about.  He has asked her to repeat the same thing over and over. Wife reported memory problem has been going on for the past 6 months and it is getting worse.  Most of the issues are related to missing objects. He has missed his wallet, misplaced his phone and does not remember previous conversations.  He is able to cook, clean, bath and dress himself.  He has not had any recent accident or get lost in familiar places.  He continues to drive but for only for short distance.  For finances, they do pay the bills together;  wife reports that in the past he had missed some payments but since they have been working together to pay the bills, they have not had any problems with missing payment. 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  He had recent lab work: His TSH is 1.64, B12 224, his previous HbA1c is 8.6.  His last brain imaging was a CT of the head done in September 2020 and showed some cerebral atrophy some small vessel white matter disease but no acute intracranial pathology.     REVIEW OF SYSTEMS: Full 14 system review of systems performed and negative with exception of: as noted in the HPI  ALLERGIES: No Known Allergies  HOME MEDICATIONS: Outpatient Medications Prior to Visit  Medication Sig Dispense Refill   Accu-Chek Softclix  Lancets lancets USE AS DIRECTED UP TO 4 TIMES DAILY 100 each 0   atorvastatin  (LIPITOR) 10 MG tablet Take 1 tablet by mouth once daily 90 tablet 0   blood glucose meter kit and supplies Dispense based on patient and insurance preference. Use up to four times daily as directed. (FOR ICD-10 E10.9, E11.9). 1 each 0   Blood Glucose Monitoring Suppl (BLOOD GLUCOSE METER) kit Use as instructed 1 each 0   donepezil  (ARICEPT ) 10 MG tablet TAKE 1 TABLET BY MOUTH AT BEDTIME 90 tablet 0   glucose blood test strip Check blood sugar 3 times daily 100 each 4   Incontinence Supply Disposable (DEPEND EASY FIT UNDERGARMENTS) MISC 1 Application by Does not apply route as needed. 30 each 6   insulin  glargine (LANTUS ) 100 UNIT/ML injection Inject 0.15 mLs (15 Units total) into the skin daily. 10 mL 3   Insulin  Pen Needle (BD PEN NEEDLE NANO U/F) 32G X 4 MM MISC USE DAILY AS DIRECTED 100 each 2   LANTUS  SOLOSTAR 100 UNIT/ML Solostar Pen INJECT 34 UNITS SUBCUTANEOUSLY ONCE DAILY 15 mL 0   lisinopril  (ZESTRIL ) 5 MG tablet Take 1 tablet by mouth once daily 90 tablet 0   Metformin  HCl 500 MG/5ML SOLN TAKE 10 ML BY MOUTH  TWICE DAILY . APPOINTMENT REQUIRED FOR FUTURE REFILLS 473 mL 0   TYLENOL  500 MG tablet Take 500-1,000 mg by mouth every 6 (six) hours as needed for mild pain (pain score 1-3) or headache.     aspirin  81 MG tablet Take 81 mg by mouth every 7 (seven) days. (Patient not taking: Reported on 04/04/2024)     benzonatate  (TESSALON ) 200 MG capsule Take 1 capsule (200 mg total) by mouth 2 (two) times daily. (Patient not taking: Reported on 04/04/2024) 20 capsule 0   carbidopa -levodopa  (SINEMET  IR) 25-100 MG tablet Take 1 tablet by mouth 4 (four) times daily. (Patient taking differently: Take 1 tablet by mouth See admin instructions. Take 1 tablet by mouth at 11 AM, 2 PM, 5 PM, and at bedtime) 360 tablet 3   No facility-administered medications prior to visit.    PAST MEDICAL HISTORY: Past Medical History:   Diagnosis Date   Diabetes mellitus without complication (HCC)    GERD (gastroesophageal reflux disease)    no meds   Hypertension    controlled on meds   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     PAST SURGICAL HISTORY: Past Surgical History:  Procedure Laterality Date   ABSCESS DRAINAGE     COLONOSCOPY     ~ 10 yrs ago   FRACTURE SURGERY     HARDWARE REMOVAL  03/26/2012   Procedure: HARDWARE REMOVAL;  Surgeon: Janifer Meigs, MD;  Location: [facility];  Service: Orthopedics;  Laterality: Left;  hardware removal deep left ankle syndesmotic screws only and stress xrays ankle  ORIF ANKLE FRACTURE  12/03/2011   Procedure: OPEN REDUCTION INTERNAL FIXATION (ORIF) ANKLE FRACTURE;  Surgeon: Janifer Meigs, MD;  Location: [facility];  Service: Orthopedics;  Laterality: Left;  left medial malleolus fracture   PILONIDAL CYST EXCISION  ~1982   SKIN GRAFT  ~1970   Lt great toe (graft from Lt thigh)    FAMILY HISTORY: Family History  Problem Relation Age of Onset   Cancer Mother    Cancer Father    Colon cancer Neg Hx    Colon polyps Neg Hx    Esophageal cancer Neg Hx    Rectal cancer Neg Hx    Stomach cancer Neg Hx     SOCIAL HISTORY: Social History   Socioeconomic History   Marital status: Married    Spouse name: Not on file   Number of children: 2   Years of education: Not on file   Highest education level: Not on file  Occupational History   Occupation: retired  Tobacco Use   Smoking status: Former    Current packs/day: 0.00    Types: Cigarettes    Quit date: 10/30/1988    Years since quitting: 35.4   Smokeless tobacco: Never  Vaping Use   Vaping status: Never Used  Substance and Sexual Activity   Alcohol use: Yes    Alcohol/week: 1.0 standard drink of alcohol    Types: 1 Shots of liquor per week    Comment: every two weeks   Drug use: No   Sexual activity: Yes    Birth control/protection: None  Other Topics Concern   Not on file  Social  History Narrative   Not on file   Social Drivers of Health   Financial Resource Strain: Low Risk  (03/25/2023)   Overall Financial Resource Strain (CARDIA)    Difficulty of Paying Living Expenses: Not hard at all  Food Insecurity: No Food Insecurity (12/17/2023)   Hunger Vital Sign    Worried About Running Out of Food in the Last Year: Never true    Ran Out of Food in the Last Year: Never true  Transportation Needs: No Transportation Needs (12/17/2023)   PRAPARE - Administrator, Civil Service (Medical): No    Lack of Transportation (Non-Medical): No  Physical Activity: Inactive (03/25/2023)   Exercise Vital Sign    Days of Exercise per Week: 0 days    Minutes of Exercise per Session: 0 min  Stress: No Stress Concern Present (03/25/2023)   [employer] of Occupational Health - Occupational Stress Questionnaire    Feeling of Stress : Not at all  Social Connections: Socially Integrated (12/17/2023)   Social Connection and Isolation Panel [NHANES]    Frequency of Communication with Friends and Family: Three times a week    Frequency of Social Gatherings with Friends and Family: Twice a week    Attends Religious Services: More than 4 times per year    Active Member of [employer] or Organizations: No    Attends Engineer, structural: More than 4 times per year    Marital Status: Married  Catering manager Violence: Not At Risk (12/17/2023)   Humiliation, Afraid, Rape, and Kick questionnaire    Fear of Current or Ex-Partner: No    Emotionally Abused: No    Physically Abused: No    Sexually Abused: No     PHYSICAL EXAM  GENERAL EXAM/CONSTITUTIONAL: Vitals:  Vitals:   04/04/24 1439  BP: 132/77  Pulse: 78  SpO2: 96%  Height: 6\' 1"  (1.854 m)    Body mass index is 31.4 kg/m. Wt Readings from Last 3 Encounters:  02/10/24 238 lb (108 kg)  01/21/24 238 lb (108 kg)  12/17/23 235 lb 0.2 oz (106.6 kg)   Patient is in no distress; well developed, nourished and  groomed; neck is supple.  MUSCULOSKELETAL: Gait, strength, tone, movements noted in Neurologic exam below  NEUROLOGIC: MENTAL STATUS:     05/29/2021    2:57 PM  MMSE - Mini Mental State Exam  Orientation to time 4  Orientation to Place 4  Registration 3  Attention/ Calculation 2  Recall 2  Language- name 2 objects 2  Language- repeat 1  Language- follow 3 step command 2  Language- follow 3 step command-comments handed paper back to me  Language- read & follow direction 0  Language-read & follow direction-comments did not close eyes  Write a sentence 1  Copy design 0  Total score 21    CRANIAL NERVE:  2nd, 3rd, 4th, 6th -visual fields full to confrontation, extraocular muscles intact, no nystagmus 5th - facial sensation symmetric 7th - decrease facial expression (masked facies) 8th - hearing intact 9th - palate elevates symmetrically, uvula midline 11th - shoulder shrug symmetric 12th - tongue protrusion midline Glabellar reflex present   MOTOR:  normal bulk. Bradykinesia is also present  SENSORY:  normal and symmetric to light touch,  COORDINATION:  Slow finger-nose-finger. No resting or action tremors noted    GAIT/STATION:  Difficulty getting up with arms crossed, but was able to do so after multiple attempts. Shuffling gait, decrease arm swing, turn in block.     DIAGNOSTIC DATA (LABS, IMAGING, TESTING) - I reviewed patient records, labs, notes, testing and imaging myself where available.  Lab Results  Component Value Date   WBC 11.0 (H) 01/21/2024   HGB 11.8 (L) 01/21/2024   HCT 35.7 (L) 01/21/2024   MCV 93.9 01/21/2024   PLT 204.0 01/21/2024      Component Value Date/Time   NA 139 01/21/2024 1401   NA 138 01/19/2020 1118   K 4.5 01/21/2024 1401   CL 103 01/21/2024 1401   CO2 28 01/21/2024 1401   GLUCOSE 145 (H) 01/21/2024 1401   BUN 8 01/21/2024 1401   BUN 11 01/19/2020 1118   CREATININE 0.81 01/21/2024 1401   CREATININE 0.86 08/20/2015  0924   CALCIUM  8.7 01/21/2024 1401   PROT 6.8 01/21/2024 1401   PROT 7.3 01/19/2020 1118   ALBUMIN 3.7 01/21/2024 1401   ALBUMIN 4.3 01/19/2020 1118   AST 9 01/21/2024 1401   ALT 7 01/21/2024 1401   ALKPHOS 58 01/21/2024 1401   BILITOT 0.5 01/21/2024 1401   BILITOT 0.6 01/19/2020 1118   GFRNONAA >60 12/23/2023 0506   GFRNONAA >89 08/20/2015 0924   GFRAA 90 01/19/2020 1118   GFRAA >89 08/20/2015 0924   Lab Results  Component Value Date   CHOL 93 01/21/2024   HDL 42.60 01/21/2024   LDLCALC 39 01/21/2024   TRIG 58.0 01/21/2024   CHOLHDL 2 01/21/2024   Lab Results  Component Value Date   HGBA1C 7.9 (H) 01/21/2024   Lab Results  Component Value Date   VITAMINB12 324 04/24/2021   Lab Results  Component Value Date   TSH 1.64 04/24/2021    CT Head 07/2019: Cerebral atrophy, no acute intracranial pathology.   DatScan  04/09/2023 Decreased radiotracer activity posterior RIGHT putamen is a pattern which could indicate Parkinsonian syndrome pathology   Routine EEG 07/04/2021 This  is an abnormal EEG recording in the waking and sleeping state due to diffuse generalized slowing. No interictal epileptiform discharges were seen at any time during the recording.  Diffuse slowing is consistent with non specific generalized brain dysfunction such as encephalopathy. No seizures captured.    ASSESSMENT AND PLAN  73 y.o. year old male here for follow up for his mild cognitive impairment and Parkinson disease. There is some improvement with Sinemet  and physical therapy.  He was admitted in February for flu like symptoms which made his Parkinson disease symptoms worse. Currently doing home PT and OT. We will continue him on Sinemet  25/100 4 times daily. Will also give him a trial of amitriptyline for his Sialorrhea.  Recommended increase in physical activity and advised them to contact me if any other questions or concerns. I will see them in a year for follow up or sooner if worse.    1.  Parkinson's disease without dyskinesia, unspecified whether manifestations fluctuate (HCC)   2. Sialorrhea   3. Hypophonia     Patient Instructions  Continue with Sinemet  25/100 4 times daily  Trial of Amitriptyline for Sialorrhea  Please increase physical activity, consider joining the gym  Return in a year or sooner if worse    No orders of the defined types were placed in this encounter.   Meds ordered this encounter  Medications   carbidopa -levodopa  (SINEMET  IR) 25-100 MG tablet    Sig: Take 1 tablet by mouth 4 (four) times daily. Take 1 tablet by mouth at 11 AM, 2 PM, 5 PM, and at bedtime    Dispense:  360 tablet    Refill:  3   amitriptyline (ELAVIL) 10 MG tablet    Sig: Take 1 tablet (10 mg total) by mouth at bedtime.    Dispense:  30 tablet    Refill:  0   --------------------------------------------------------------------------------------------  There are well-accepted and sensible ways to reduce risk for Alzheimers disease and other degenerative brain disorders .  Exercise Daily Walk A daily 20 minute walk should be part of your routine. Disease related apathy can be a significant roadblock to exercise and the only way to overcome this is to make it a daily routine and perhaps have a reward at the end (something your loved one loves to eat or drink perhaps) or a personal trainer coming to the home can also be very useful. Most importantly, the patient is much more likely to exercise if the caregiver / spouse does it with him/her. In general a structured, repetitive schedule is best.  General Health: Any diseases which effect your body will effect your brain such as a pneumonia, urinary infection, blood clot, heart attack or stroke. Keep contact with your primary care doctor for regular follow ups.  Sleep. A good nights sleep is healthy for the brain. Seven hours is recommended. If you have insomnia or poor sleep habits we can give you some instructions. If you have sleep  apnea wear your mask.  Diet: Eating a heart healthy diet is also a good idea; fish and poultry instead of red meat, nuts (mostly non-peanuts), vegetables, fruits, olive oil or canola oil (instead of butter), minimal salt (use other spices to flavor foods), whole grain rice, bread, cereal and pasta and wine in moderation.Research is now showing that the MIND diet, which is a combination of The Mediterranean diet and the DASH diet, is beneficial for cognitive processing and longevity. Information about this diet can be found in The MIND Diet,  a book by Andria Keeler, MS, RDN, and online at [employer]Wild[employer].es  Finances, Power of [address] and Advance Directives: You should consider putting legal safeguards in place with regard to financial and medical decision making. While the spouse always has power of attorney for medical and financial issues in the absence of any form, you should consider what you want in case the spouse / caregiver is no longer around or capable of making decisions.    Return in about 1 year (around 04/04/2025).   Cassandra Cleveland, MD 04/04/2024, 5:21 PM  [facility] [address], [address] [place], [place] [address] [phone]

## 2024-04-05 ENCOUNTER — Other Ambulatory Visit: Payer: Self-pay | Admitting: Family Medicine

## 2024-04-05 DIAGNOSIS — I10 Essential (primary) hypertension: Secondary | ICD-10-CM

## 2024-04-08 ENCOUNTER — Ambulatory Visit: Admitting: Podiatry

## 2024-04-08 DIAGNOSIS — M79672 Pain in left foot: Secondary | ICD-10-CM

## 2024-04-08 DIAGNOSIS — I872 Venous insufficiency (chronic) (peripheral): Secondary | ICD-10-CM | POA: Diagnosis not present

## 2024-04-08 DIAGNOSIS — I890 Lymphedema, not elsewhere classified: Secondary | ICD-10-CM

## 2024-04-08 DIAGNOSIS — M79671 Pain in right foot: Secondary | ICD-10-CM

## 2024-04-08 NOTE — Patient Instructions (Signed)
.  tfc  

## 2024-04-08 NOTE — Progress Notes (Signed)
 Patient presents today with complaints of swelling in the lower legs and feet bilaterally.  He has had it for walks for several years but seems that is getting worse.  He is a type II diabetic.  Gets some aching in the legs at times with the swelling.  Has not noticed any redness.  No trauma.   Physical exam:  General appearance: Pleasant, and in no acute distress. AOx3.  Vascular: Pedal pulses: DP 2/4 bilaterally, PT 0/4 bilaterally.  Severe edema lower legs bilaterally. Capillary fill time immediate.  Neurological: Light touch intact feet bilaterally.  Normal Achilles reflex bilaterally.  No clonus or spasticity noted.  Decreased vibratory sensation bilaterally  Dermatologic:   Skin normal temperature bilaterally.  Skin normal color, tone, and texture bilaterally.   Musculoskeletal: Toes 2 through 5 bilaterally.  No tenderness around ankles or feet with palpation or range of motion.    Diagnosis: 1.  Pain lower extremities bilaterally. 2 lymphedema secondary to venous insufficiency bilaterally.  Plan: -Established office visit level 3 for evaluation and management. - Discussed with him lymphedema secondary to venous insufficiency.  Discussed causes and treatment.  Recommended wearing compression hose 20 to 30 mmHg BK bilateral.  Explains he needs where there is wear these daily.  Discussed some exercises that he can do also gave him a handout on application of stockings and lymphedema.  Discussed proper shoes.  Recommended he limit his sodium intake.    Return 2 months follow-up for lymphedema

## 2024-04-11 ENCOUNTER — Telehealth: Payer: Self-pay

## 2024-04-11 DIAGNOSIS — D638 Anemia in other chronic diseases classified elsewhere: Secondary | ICD-10-CM | POA: Diagnosis not present

## 2024-04-11 DIAGNOSIS — G20A1 Parkinson's disease without dyskinesia, without mention of fluctuations: Secondary | ICD-10-CM | POA: Diagnosis not present

## 2024-04-11 DIAGNOSIS — Z794 Long term (current) use of insulin: Secondary | ICD-10-CM | POA: Diagnosis not present

## 2024-04-11 DIAGNOSIS — E785 Hyperlipidemia, unspecified: Secondary | ICD-10-CM | POA: Diagnosis not present

## 2024-04-11 DIAGNOSIS — Z7984 Long term (current) use of oral hypoglycemic drugs: Secondary | ICD-10-CM | POA: Diagnosis not present

## 2024-04-11 DIAGNOSIS — E119 Type 2 diabetes mellitus without complications: Secondary | ICD-10-CM | POA: Diagnosis not present

## 2024-04-11 DIAGNOSIS — Z9181 History of falling: Secondary | ICD-10-CM | POA: Diagnosis not present

## 2024-04-11 DIAGNOSIS — G9341 Metabolic encephalopathy: Secondary | ICD-10-CM | POA: Diagnosis not present

## 2024-04-11 DIAGNOSIS — Z7982 Long term (current) use of aspirin: Secondary | ICD-10-CM | POA: Diagnosis not present

## 2024-04-11 DIAGNOSIS — Z556 Problems related to health literacy: Secondary | ICD-10-CM | POA: Diagnosis not present

## 2024-04-11 DIAGNOSIS — I10 Essential (primary) hypertension: Secondary | ICD-10-CM | POA: Diagnosis not present

## 2024-04-11 DIAGNOSIS — M109 Gout, unspecified: Secondary | ICD-10-CM | POA: Diagnosis not present

## 2024-04-11 NOTE — Telephone Encounter (Signed)
 Paperwork completed and placed in fax bin at back nurse station

## 2024-04-11 NOTE — Telephone Encounter (Signed)
 Placed in your sign folder

## 2024-04-11 NOTE — Telephone Encounter (Signed)
 Type of form received: [facility]  Additional comments:  order signature   Received by: fax  Form should be Faxed/mailed to: [phone]  Is patient requesting call for pickup: no  Form placed:  in provider front office pick up folder  Attach charge sheet.  Provider will determine charge.  Individual made aware of 3-5 business day turn around No?

## 2024-04-15 DIAGNOSIS — Z7982 Long term (current) use of aspirin: Secondary | ICD-10-CM | POA: Diagnosis not present

## 2024-04-15 DIAGNOSIS — G9341 Metabolic encephalopathy: Secondary | ICD-10-CM | POA: Diagnosis not present

## 2024-04-15 DIAGNOSIS — D638 Anemia in other chronic diseases classified elsewhere: Secondary | ICD-10-CM | POA: Diagnosis not present

## 2024-04-15 DIAGNOSIS — Z794 Long term (current) use of insulin: Secondary | ICD-10-CM | POA: Diagnosis not present

## 2024-04-15 DIAGNOSIS — G20A1 Parkinson's disease without dyskinesia, without mention of fluctuations: Secondary | ICD-10-CM | POA: Diagnosis not present

## 2024-04-15 DIAGNOSIS — Z9181 History of falling: Secondary | ICD-10-CM | POA: Diagnosis not present

## 2024-04-15 DIAGNOSIS — E785 Hyperlipidemia, unspecified: Secondary | ICD-10-CM | POA: Diagnosis not present

## 2024-04-15 DIAGNOSIS — I10 Essential (primary) hypertension: Secondary | ICD-10-CM | POA: Diagnosis not present

## 2024-04-15 DIAGNOSIS — M109 Gout, unspecified: Secondary | ICD-10-CM | POA: Diagnosis not present

## 2024-04-15 DIAGNOSIS — E119 Type 2 diabetes mellitus without complications: Secondary | ICD-10-CM | POA: Diagnosis not present

## 2024-04-15 DIAGNOSIS — Z7984 Long term (current) use of oral hypoglycemic drugs: Secondary | ICD-10-CM | POA: Diagnosis not present

## 2024-04-15 DIAGNOSIS — Z556 Problems related to health literacy: Secondary | ICD-10-CM | POA: Diagnosis not present

## 2024-04-19 MED ORDER — DEPEND EASY FIT UNDERGARMENTS MISC: 1.0000 | 6 refills | Status: AC | PRN

## 2024-04-19 NOTE — Telephone Encounter (Signed)
 Pt wife notes he has been wearing depends since leaving the hospital and is wondering if he can have a prescription for these.

## 2024-04-19 NOTE — Telephone Encounter (Signed)
 Looks like those were ordered prior with refills, but I sent them in again to [facility]. Let me know if I help further.

## 2024-04-19 NOTE — Telephone Encounter (Unsigned)
 Copied from CRM [id number]. Topic: Clinical - Medical Advice >> Apr 19, 2024 10:53 AM Edward Briggs wrote: Reason for CRM: pt wife called to speak with nurse regarding depend under garmets for  the pt requesting a call back [phone]

## 2024-04-19 NOTE — Addendum Note (Signed)
 Addended by: Adam Demary R on: 04/19/2024 12:52 PM   Modules accepted: Orders

## 2024-04-25 ENCOUNTER — Other Ambulatory Visit: Payer: Self-pay | Admitting: Family Medicine

## 2024-04-25 DIAGNOSIS — Z794 Long term (current) use of insulin: Secondary | ICD-10-CM | POA: Diagnosis not present

## 2024-04-25 DIAGNOSIS — I10 Essential (primary) hypertension: Secondary | ICD-10-CM | POA: Diagnosis not present

## 2024-04-25 DIAGNOSIS — M109 Gout, unspecified: Secondary | ICD-10-CM | POA: Diagnosis not present

## 2024-04-25 DIAGNOSIS — Z7984 Long term (current) use of oral hypoglycemic drugs: Secondary | ICD-10-CM | POA: Diagnosis not present

## 2024-04-25 DIAGNOSIS — E785 Hyperlipidemia, unspecified: Secondary | ICD-10-CM | POA: Diagnosis not present

## 2024-04-25 DIAGNOSIS — E119 Type 2 diabetes mellitus without complications: Secondary | ICD-10-CM | POA: Diagnosis not present

## 2024-04-25 DIAGNOSIS — E1165 Type 2 diabetes mellitus with hyperglycemia: Secondary | ICD-10-CM

## 2024-04-25 DIAGNOSIS — G20A1 Parkinson's disease without dyskinesia, without mention of fluctuations: Secondary | ICD-10-CM | POA: Diagnosis not present

## 2024-04-25 DIAGNOSIS — G9341 Metabolic encephalopathy: Secondary | ICD-10-CM | POA: Diagnosis not present

## 2024-04-25 DIAGNOSIS — Z556 Problems related to health literacy: Secondary | ICD-10-CM | POA: Diagnosis not present

## 2024-04-25 DIAGNOSIS — Z7982 Long term (current) use of aspirin: Secondary | ICD-10-CM | POA: Diagnosis not present

## 2024-04-25 DIAGNOSIS — Z9181 History of falling: Secondary | ICD-10-CM | POA: Diagnosis not present

## 2024-04-25 DIAGNOSIS — D638 Anemia in other chronic diseases classified elsewhere: Secondary | ICD-10-CM | POA: Diagnosis not present

## 2024-04-27 ENCOUNTER — Other Ambulatory Visit: Payer: Self-pay | Admitting: Family Medicine

## 2024-04-27 DIAGNOSIS — E1165 Type 2 diabetes mellitus with hyperglycemia: Secondary | ICD-10-CM

## 2024-04-28 ENCOUNTER — Ambulatory Visit (INDEPENDENT_AMBULATORY_CARE_PROVIDER_SITE_OTHER): Admitting: Family Medicine

## 2024-04-28 ENCOUNTER — Encounter: Payer: Self-pay | Admitting: Family Medicine

## 2024-04-28 VITALS — BP 126/70 | HR 77 | Temp 98.3°F | Resp 19 | Ht 73.0 in | Wt 233.6 lb

## 2024-04-28 DIAGNOSIS — Z794 Long term (current) use of insulin: Secondary | ICD-10-CM

## 2024-04-28 DIAGNOSIS — G20C Parkinsonism, unspecified: Secondary | ICD-10-CM

## 2024-04-28 DIAGNOSIS — E1165 Type 2 diabetes mellitus with hyperglycemia: Secondary | ICD-10-CM

## 2024-04-28 DIAGNOSIS — R32 Unspecified urinary incontinence: Secondary | ICD-10-CM | POA: Diagnosis not present

## 2024-04-28 LAB — COMPREHENSIVE METABOLIC PANEL WITH GFR
ALT: 7 U/L (ref 0–53)
AST: 12 U/L (ref 0–37)
Albumin: 4.2 g/dL (ref 3.5–5.2)
Alkaline Phosphatase: 67 U/L (ref 39–117)
BUN: 10 mg/dL (ref 6–23)
CO2: 30 meq/L (ref 19–32)
Calcium: 9.6 mg/dL (ref 8.4–10.5)
Chloride: 104 meq/L (ref 96–112)
Creatinine, Ser: 0.72 mg/dL (ref 0.40–1.50)
GFR: 91.27 mL/min (ref >60.00)
Glucose, Bld: 141 mg/dL — ABNORMAL HIGH (ref 70–99)
Potassium: 4.6 meq/L (ref 3.5–5.1)
Sodium: 138 meq/L (ref 135–145)
Total Bilirubin: 0.6 mg/dL (ref 0.2–1.2)
Total Protein: 7.5 g/dL (ref 6.0–8.3)

## 2024-04-28 LAB — HEMOGLOBIN A1C: Hgb A1c MFr Bld: 8.2 % — ABNORMAL HIGH (ref 4.6–6.5)

## 2024-04-28 NOTE — Patient Instructions (Addendum)
 If no relief with amitriptyline , discuss options with your specialist. Glad to hear no side effects, though.   I will refer you to urology to discuss further workup or treatment for the urinary incontinence.  Okay to use incontinence pads or depends for now.  I did send the prescription to [facility] a few weeks ago.  Let me know if I need to send that differently.  No change in diabetes meds for now.  I will check your A1c today and if it is still slightly elevated we may adjust the insulin  slightly.  Recheck in 3 months.  Take care!

## 2024-04-28 NOTE — Progress Notes (Signed)
 Subjective:  Patient ID: Edward Briggs, male    DOB: [birth year]  Age: 73 y.o. MRN: [id number]  CC:  Chief Complaint  Patient presents with   Medical Management of Chronic Issues    Pt notes a need for Rx for depends,     HPI Edward Briggs presents for   Diabetes: Insulin -dependent.  Last visit in March.  Prior decrease insulin  dosage during hospitalization due to decreased intake but he had returned to his 34 units of insulin  since returning home and was taking that dose at his March visit.  His metformin  was initially held during hospitalization as well, back on 1000mg  bid - liquid.  He has had to use forming solution due to pill dysphagia. Home readings were stable without lows or 200s at his March visit.  A1c slightly elevated at 7.9. He is on statin and ACE inhibitor. Home readings 140-150.  Rare 200's with sweets - once per week.  No lows. Lowest 125.  Recent refill of metformin .  Microalbumin: Normal in May 2024, repeat today. Optho, foot exam, pneumovax:  Up to date.   Lab Results  Component Value Date   HGBA1C 7.9 (H) 01/21/2024   HGBA1C 7.8 (H) 09/18/2023   HGBA1C 7.6 (H) 06/18/2023   Lab Results  Component Value Date   MICROALBUR <0.7 03/13/2023   LDLCALC 39 01/21/2024   CREATININE 0.81 01/21/2024   Urinary incontinence Has required use of incontinence pads/underwear.  Recent prescription given.  Frequent urination discussed at his March visit.PSA was normal at 0.48, urine culture with no growth. Not making to bathroom.   Parkinson's disease He does have history of Parkinson's disease treated with Sinemet , followed by Dr. Gregg.  Aricept  for mild cognitive impairment.  He was started on amitriptyline  for sialorrhea at his June 2 visit.  Continue with physical therapy, Occupational Therapy and Sinemet .  1 year follow-up planned. Minimal changes on amitriptyline , but no side effects.    History Patient Active Problem List   Diagnosis Date Noted    Influenza A 12/17/2023   HTN (hypertension) 07/22/2019   Acute metabolic encephalopathy 07/22/2019   AKI (acute kidney injury) (HCC) 07/22/2019   Sepsis (HCC) 07/22/2019   UTI (urinary tract infection) 07/22/2019   Diabetes mellitus without complication (HCC) 09/10/2012   CHEST PAIN 07/21/2009   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    Past Surgical History:  Procedure Laterality Date   ABSCESS DRAINAGE     COLONOSCOPY     ~ 10 yrs ago   FRACTURE SURGERY     HARDWARE REMOVAL  03/26/2012   Procedure: HARDWARE REMOVAL;  Surgeon: Deward DELENA Schwartz, MD;  Location: [facility];  Service: Orthopedics;  Laterality: Left;  hardware removal deep left ankle syndesmotic screws only and stress xrays ankle    ORIF ANKLE FRACTURE  12/03/2011   Procedure: OPEN REDUCTION INTERNAL FIXATION (ORIF) ANKLE FRACTURE;  Surgeon: Deward DELENA Schwartz, MD;  Location: [facility];  Service: Orthopedics;  Laterality: Left;  left medial malleolus fracture   PILONIDAL CYST EXCISION  ~1982   SKIN GRAFT  ~1970   Lt great toe (graft from Lt thigh)   No Known Allergies Prior to Admission medications   Medication Sig Start Date End Date Taking? Authorizing Provider  Accu-Chek  Softclix Lancets lancets USE AS DIRECTED UP TO 4 TIMES DAILY 01/22/22   Levora Reyes SAUNDERS, MD  amitriptyline  (ELAVIL ) 10 MG tablet Take 1 tablet (10 mg total) by mouth at bedtime. 04/04/24   Camara, Amadou, MD  aspirin  81 MG tablet Take 81 mg by mouth every 7 (seven) days.    [provider]  atorvastatin  (LIPITOR) 10 MG tablet Take 1 tablet by mouth once daily 04/01/24   Levora Reyes SAUNDERS, MD  benzonatate  (TESSALON ) 200 MG capsule Take 1 capsule (200 mg total) by mouth 2 (two) times daily. 12/24/23   Elpidio Reyes DEL, MD  blood glucose meter kit and  supplies Dispense based on patient and insurance preference. Use up to four times daily as directed. (FOR ICD-10 E10.9, E11.9). 11/25/21   Levora Reyes SAUNDERS, MD  Blood Glucose Monitoring Suppl (BLOOD GLUCOSE METER) kit Use as instructed 09/10/12   Tish Alm DEL, MD  carbidopa -levodopa  (SINEMET  IR) 25-100 MG tablet Take 1 tablet by mouth 4 (four) times daily. Take 1 tablet by mouth at 11 AM, 2 PM, 5 PM, and at bedtime 04/04/24 03/30/25  Camara, Amadou, MD  donepezil  (ARICEPT ) 10 MG tablet TAKE 1 TABLET BY MOUTH AT BEDTIME 02/18/24   Camara, Amadou, MD  glucose blood test strip Check blood sugar 3 times daily 11/25/21   Levora Reyes SAUNDERS, MD  Incontinence Supply Disposable (DEPEND EASY FIT UNDERGARMENTS) MISC 1 Application by Does not apply route as needed. 04/19/24   Levora Reyes SAUNDERS, MD  insulin  glargine (LANTUS ) 100 UNIT/ML injection Inject 0.15 mLs (15 Units total) into the skin daily. 12/24/23   Elpidio Reyes DEL, MD  Insulin  Pen Needle (BD PEN NEEDLE NANO U/F) 32G X 4 MM MISC USE DAILY AS DIRECTED 03/28/21   Levora Reyes SAUNDERS, MD  LANTUS  SOLOSTAR 100 UNIT/ML Solostar Pen INJECT 34 UNITS SUBCUTANEOUSLY ONCE DAILY 03/09/24   Levora Reyes SAUNDERS, MD  lisinopril  (ZESTRIL ) 5 MG tablet Take 1 tablet by mouth once daily 04/05/24   Amberley Hamler R, MD  Metformin  HCl 500 MG/5ML SOLN TAKE 10 ML BY MOUTH  TWICE DAILY . APPOINTMENT REQUIRED FOR FUTURE REFILLS 04/27/24   Levora Reyes SAUNDERS, MD  TYLENOL  500 MG tablet Take 500-1,000 mg by mouth every 6 (six) hours as needed for mild pain (pain score 1-3) or headache.    [provider]   Social History   Socioeconomic History   Marital status: Married    Spouse name: Not on file   Number of children: 2   Years of education: Not on file   Highest education level: Not on file  Occupational History   Occupation: retired  Tobacco Use   Smoking status: Former    Current packs/day: 0.00    Types: Cigarettes    Quit date: 10/30/1988    Years since quitting:  35.5   Smokeless tobacco: Never  Vaping Use   Vaping status: Never Used  Substance and Sexual Activity   Alcohol use: Yes    Alcohol/week: 1.0 standard drink of alcohol    Types: 1 Shots of liquor per week    Comment: every two weeks   Drug use: No   Sexual activity: Yes    Birth control/protection: None  Other Topics Concern   Not on file  Social History Narrative   Not on file   Social Drivers of Health   Financial Resource Strain: Low Risk  (03/25/2023)   Overall Financial Resource Strain (CARDIA)    Difficulty of Paying Living Expenses:  Not hard at all  Food Insecurity: No Food Insecurity (12/17/2023)   Hunger Vital Sign    Worried About Running Out of Food in the Last Year: Never true    Ran Out of Food in the Last Year: Never true  Transportation Needs: No Transportation Needs (12/17/2023)   PRAPARE - Administrator, Civil Service (Medical): No    Lack of Transportation (Non-Medical): No  Physical Activity: Inactive (03/25/2023)   Exercise Vital Sign    Days of Exercise per Week: 0 days    Minutes of Exercise per Session: 0 min  Stress: No Stress Concern Present (03/25/2023)   [employer] of Occupational Health - Occupational Stress Questionnaire    Feeling of Stress : Not at all  Social Connections: Socially Integrated (12/17/2023)   Social Connection and Isolation Panel    Frequency of Communication with Friends and Family: Three times a week    Frequency of Social Gatherings with Friends and Family: Twice a week    Attends Religious Services: More than 4 times per year    Active Member of [employer] or Organizations: No    Attends Engineer, structural: More than 4 times per year    Marital Status: Married  Catering manager Violence: Not At Risk (12/17/2023)   Humiliation, Afraid, Rape, and Kick questionnaire    Fear of Current or Ex-Partner: No    Emotionally Abused: No    Physically Abused: No    Sexually Abused: No    Review of Systems   Constitutional:  Negative for fatigue and unexpected weight change.  Eyes:  Negative for visual disturbance.  Respiratory:  Negative for cough, chest tightness and shortness of breath.   Cardiovascular:  Negative for chest pain, palpitations and leg swelling.  Gastrointestinal:  Negative for abdominal pain and blood in stool.  Neurological:  Negative for dizziness, light-headedness and headaches.     Objective:   Vitals:   04/28/24 0935  BP: 126/70  Pulse: 77  Resp: 19  Temp: 98.3 F (36.8 C)  TempSrc: Temporal  SpO2: 99%  Weight: 233 lb 9.6 oz (106 kg)  Height: 6' 1 (1.854 m)     Physical Exam Vitals reviewed.  Constitutional:      Appearance: He is well-developed.  HENT:     Head: Normocephalic and atraumatic.  Neck:     Vascular: No carotid bruit or JVD.   Cardiovascular:     Rate and Rhythm: Normal rate and regular rhythm.     Heart sounds: Normal heart sounds. No murmur heard. Pulmonary:     Effort: Pulmonary effort is normal.     Breath sounds: Normal breath sounds. No rales.   Musculoskeletal:     Right lower leg: No edema.     Left lower leg: No edema.   Skin:    General: Skin is warm and dry.   Neurological:     Mental Status: He is alert and oriented to person, place, and time.   Psychiatric:        Mood and Affect: Mood normal.        Assessment & Plan:  JAMAHL LEMMONS is a 73 y.o. male . Urinary incontinence, unspecified type - Plan: Ambulatory referral to Urology  - Persistent symptoms, question overflow incontinence, may need PVR, refer to urology for eval, treatment.  Of note he has been recently started on amitriptyline , would be cautious with other meds, anticholinergics.    Type 2 diabetes mellitus with hyperglycemia, with  long-term current use of insulin  (HCC) - Plan: Comprehensive metabolic panel with GFR, Hemoglobin A1c, Microalbumin / creatinine urine ratio  - Slight elevated A1c previously, rare 200 based on diet once per  week.  Usually well-controlled.  Check A1c again and consider slight higher dose of insulin  but will continue 34 units/day for now along with his metformin .  14-month follow-up.  Parkinsonism, unspecified Parkinsonism type (HCC) Recent visit with specialist noted, continue same dose Sinemet , will start on amitriptyline  for sialorrhea with minimal change.  Recommended he discuss with his specialist but luckily no side effects.  No orders of the defined types were placed in this encounter.  Patient Instructions  If no relief with amitriptyline , discuss options with your specialist. Glad to hear no side effects, though.   I will refer you to urology to discuss further workup or treatment for the urinary incontinence.  Okay to use incontinence pads or depends for now.  I did send the prescription to [facility] a few weeks ago.  Let me know if I need to send that differently.  No change in diabetes meds for now.  I will check your A1c today and if it is still slightly elevated we may adjust the insulin  slightly.  Recheck in 3 months.  Take care!       Signed,   Reyes Pines, MD  Primary Care, [facility] 04/28/24 10:22 AM

## 2024-04-30 ENCOUNTER — Ambulatory Visit: Payer: Self-pay | Admitting: Family Medicine

## 2024-04-30 DIAGNOSIS — E1165 Type 2 diabetes mellitus with hyperglycemia: Secondary | ICD-10-CM

## 2024-04-30 DIAGNOSIS — R32 Unspecified urinary incontinence: Secondary | ICD-10-CM

## 2024-05-03 ENCOUNTER — Ambulatory Visit: Admitting: *Deleted

## 2024-05-03 VITALS — Ht 73.0 in | Wt 233.0 lb

## 2024-05-03 DIAGNOSIS — Z0000 Encounter for general adult medical examination without abnormal findings: Secondary | ICD-10-CM

## 2024-05-03 NOTE — Progress Notes (Signed)
 Subjective:   Edward Briggs is a 73 y.o. male who presents for Medicare Annual/Subsequent preventive examination.  Visit Complete: Virtual I connected with  [name] on 05/03/24 by a audio enabled telemedicine application and verified that I am speaking with the correct person using two identifiers.  Patient Location: Home  Provider Location: Home Office  I discussed the limitations of evaluation and management by telemedicine. The patient expressed understanding and agreed to proceed.  Vital Signs: Because this visit was a virtual/telehealth visit, some criteria may be missing or patient reported. Any vitals not documented were not able to be obtained and vitals that have been documented are patient reported.        Objective:    Today's Vitals   05/03/24 1506  Weight: 233 lb (105.7 kg)  Height: 6' 1 (1.854 m)   Body mass index is 30.74 kg/m.     05/03/2024    2:59 PM 12/17/2023    5:41 PM 12/17/2023    5:40 PM 12/17/2023   12:33 PM 11/30/2023    3:26 PM 11/23/2023    1:17 PM 07/02/2023   12:30 PM  Advanced Directives  Does Patient Have a Medical Advance Directive? No No  No No No No  Would patient like information on creating a medical advance directive? No - Patient declined Yes (Inpatient - patient defers creating a medical advance directive and declines information at this time) Yes (Inpatient - patient defers creating a medical advance directive at this time - Information given)  No - Patient declined No - Patient declined Yes (MAU/Ambulatory/Procedural Areas - Information given)    Current Medications (verified) Outpatient Encounter Medications as of 05/03/2024  Medication Sig   Accu-Chek Softclix Lancets lancets USE AS DIRECTED UP TO 4 TIMES DAILY   amitriptyline  (ELAVIL ) 10 MG tablet Take 1 tablet (10 mg total) by mouth at bedtime.   aspirin  81 MG tablet Take 81 mg by mouth every 7 (seven) days.   atorvastatin  (LIPITOR) 10 MG tablet Take 1 tablet by mouth once  daily   blood glucose meter kit and supplies Dispense based on patient and insurance preference. Use up to four times daily as directed. (FOR ICD-10 E10.9, E11.9).   Blood Glucose Monitoring Suppl (BLOOD GLUCOSE METER) kit Use as instructed   carbidopa -levodopa  (SINEMET  IR) 25-100 MG tablet Take 1 tablet by mouth 4 (four) times daily. Take 1 tablet by mouth at 11 AM, 2 PM, 5 PM, and at bedtime   donepezil  (ARICEPT ) 10 MG tablet TAKE 1 TABLET BY MOUTH AT BEDTIME   glucose blood test strip Check blood sugar 3 times daily   Incontinence Supply Disposable (DEPEND EASY FIT UNDERGARMENTS) MISC 1 Application by Does not apply route as needed.   Insulin  Pen Needle (BD PEN NEEDLE NANO U/F) 32G X 4 MM MISC USE DAILY AS DIRECTED   LANTUS  SOLOSTAR 100 UNIT/ML Solostar Pen INJECT 34 UNITS SUBCUTANEOUSLY ONCE DAILY   lisinopril  (ZESTRIL ) 5 MG tablet Take 1 tablet by mouth once daily   Metformin  HCl 500 MG/5ML SOLN TAKE 10 ML BY MOUTH  TWICE DAILY . APPOINTMENT REQUIRED FOR FUTURE REFILLS   TYLENOL  500 MG tablet Take 500-1,000 mg by mouth every 6 (six) hours as needed for mild pain (pain score 1-3) or headache.   No facility-administered encounter medications on file as of 05/03/2024.    Allergies (verified) Patient has no known allergies.   History: Past Medical History:  Diagnosis Date   Diabetes mellitus without complication (HCC)  GERD (gastroesophageal reflux disease)    no meds   Hypertension    controlled on meds   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    Past Surgical History:  Procedure Laterality Date   ABSCESS DRAINAGE     COLONOSCOPY     ~ 10 yrs ago   FRACTURE SURGERY     HARDWARE REMOVAL  03/26/2012   Procedure: HARDWARE REMOVAL;  Surgeon: Deward DELENA Schwartz, MD;  Location: [facility];  Service: Orthopedics;  Laterality: Left;  hardware removal deep left ankle syndesmotic screws only and stress xrays ankle    ORIF ANKLE FRACTURE  12/03/2011   Procedure: OPEN REDUCTION INTERNAL  FIXATION (ORIF) ANKLE FRACTURE;  Surgeon: Deward DELENA Schwartz, MD;  Location: [facility];  Service: Orthopedics;  Laterality: Left;  left medial malleolus fracture   PILONIDAL CYST EXCISION  ~1982   SKIN GRAFT  ~1970   Lt great toe (graft from Lt thigh)   Family History  Problem Relation Age of Onset   Cancer Mother    Cancer Father    Colon cancer Neg Hx    Colon polyps Neg Hx    Esophageal cancer Neg Hx    Rectal cancer Neg Hx    Stomach cancer Neg Hx    Social History   Socioeconomic History   Marital status: Married    Spouse name: Not on file   Number of children: 2   Years of education: Not on file   Highest education level: Not on file  Occupational History   Occupation: retired  Tobacco Use   Smoking status: Former    Current packs/day: 0.00    Types: Cigarettes    Quit date: 10/30/1988    Years since quitting: 35.5   Smokeless tobacco: Never  Vaping Use   Vaping status: Never Used  Substance and Sexual Activity   Alcohol use: Yes    Alcohol/week: 1.0 standard drink of alcohol    Types: 1 Shots of liquor per week    Comment: every two weeks   Drug use: No   Sexual activity: Yes    Birth control/protection: None  Other Topics Concern   Not on file  Social History Narrative   Not on file   Social Drivers of Health   Financial Resource Strain: Low Risk  (03/25/2023)   Overall Financial Resource Strain (CARDIA)    Difficulty of Paying Living Expenses: Not hard at all  Food Insecurity: No Food Insecurity (05/03/2024)   Hunger Vital Sign    Worried About Running Out of Food in the Last Year: Never true    Ran Out of Food in the Last Year: Never true  Transportation Needs: No Transportation Needs (12/17/2023)   PRAPARE - Administrator, Civil Service (Medical): No    Lack of Transportation (Non-Medical): No  Physical Activity: Inactive (05/03/2024)   Exercise Vital Sign    Days of Exercise per Week: 0 days    Minutes of Exercise per  Session: 0 min  Stress: No Stress Concern Present (03/25/2023)   [employer] of Occupational Health - Occupational Stress Questionnaire    Feeling of Stress : Not at all  Social Connections: Moderately Integrated (05/03/2024)   Social Connection and Isolation Panel    Frequency of Communication with Friends and Family: More than three times a week    Frequency of Social Gatherings with Friends and Family: Once a week    Attends Religious Services: More than 4 times  per year    Active Member of Clubs or Organizations: No    Attends Banker Meetings: Never    Marital Status: Married    Tobacco Counseling Counseling given: Not Answered   Clinical Intake:     Pain : No/denies pain     Diabetes: Yes CBG done?: No Did pt. bring in CBG monitor from home?: No  How often do you need to have someone help you when you read instructions, pamphlets, or other written materials from your doctor or pharmacy?: 1 - Never  Interpreter Needed?: No  Information entered by :: Mliss Graff LPN   Activities of Daily Living    12/17/2023    5:27 PM  In your present state of health, do you have any difficulty performing the following activities:  Hearing? 0  Vision? 0  Difficulty concentrating or making decisions? 1    Patient Care Team: Levora Reyes SAUNDERS, MD as PCP - General (Family Medicine)  Indicate any recent Medical Services you may have received from other than [facility] providers in the past year (date may be approximate).     Assessment:   This is a routine wellness examination for Treyvion.  Hearing/Vision screen Hearing Screening - Comments:: No trouble hearing Vision Screening - Comments:: [facility] Up to date   Goals Addressed   None    Depression Screen    05/03/2024    3:06 PM 04/28/2024    9:37 AM 09/18/2023   10:36 AM 06/18/2023   10:16 AM 03/25/2023    8:50 AM 03/13/2023    8:28 AM 07/03/2022    9:21 AM  PHQ 2/9 Scores  PHQ - 2 Score 0 0 0 0 0 0 0   PHQ- 9 Score 2 0 0 0 1 1 0    Fall Risk    05/03/2024    3:01 PM 04/28/2024    9:37 AM 09/18/2023   10:35 AM 06/18/2023   10:16 AM 03/25/2023    8:42 AM  Fall Risk   Falls in the past year? 0 0 0 0 1  Number falls in past yr: 0 0 0 0 1  Injury with Fall? 1 0 0 0 0  Risk for fall due to : Impaired balance/gait No Fall Risks No Fall Risks No Fall Risks   Follow up Falls evaluation completed;Education provided;Falls prevention discussed Falls evaluation completed Falls evaluation completed Falls evaluation completed Falls evaluation completed;Education provided;Falls prevention discussed    MEDICARE RISK AT HOME: Medicare Risk at Home Any stairs in or around the home?: Yes If so, are there any without handrails?: No Home free of loose throw rugs in walkways, pet beds, electrical cords, etc?: Yes Adequate lighting in your home to reduce risk of falls?: Yes Life alert?: No Use of a cane, walker or w/c?: Yes Grab bars in the bathroom?: Yes Shower chair or bench in shower?: Yes Elevated toilet seat or a handicapped toilet?: No  TIMED UP AND GO:  Was the test performed?  No    Cognitive Function:    05/29/2021    2:57 PM  MMSE - Mini Mental State Exam  Orientation to time 4  Orientation to Place 4  Registration 3  Attention/ Calculation 2  Recall 2  Language- name 2 objects 2  Language- repeat 1  Language- follow 3 step command 2  Language- follow 3 step command-comments handed paper back to me  Language- read & follow direction 0  Language-read & follow direction-comments did not close eyes  Write a sentence 1  Copy design 0  Total score 21        05/03/2024    3:01 PM 03/25/2023    8:44 AM 08/10/2019    9:28 AM 12/07/2018    8:12 AM  6CIT Screen  What Year? 0 points 0 points 0 points 0 points  What month? 3 points 0 points 0 points   What time? 0 points 0 points 0 points 0 points  Count back from 20 4 points 2 points 0 points 0 points  Months in reverse 4 points 2  points 0 points 0 points  Repeat phrase 10 points 10 points 8 points 0 points  Total Score 21 points 14 points 8 points     Immunizations Immunization History  Administered Date(s) Administered   PFIZER Comirnaty(Gray Top)Covid-19 Tri-Sucrose Vaccine 12/16/2019, 01/10/2020, 08/31/2020   Pneumococcal Conjugate-13 04/23/2017   Pneumococcal Polysaccharide-23 12/07/2018   Tdap 03/13/2023    TDAP status: Up to date  Flu Vaccine status: Declined, Education has been provided regarding the importance of this vaccine but patient still declined. Advised may receive this vaccine at local pharmacy or Health Dept. Aware to provide a copy of the vaccination record if obtained from local pharmacy or Health Dept. Verbalized acceptance and understanding.  Pneumococcal vaccine status: Up to date  Covid-19 vaccine status: Information provided on how to obtain vaccines.   Qualifies for Shingles Vaccine? Yes   Zostavax completed No   Shingrix Completed?: No.    Education has been provided regarding the importance of this vaccine. Patient has been advised to call insurance company to determine out of pocket expense if they have not yet received this vaccine. Advised may also receive vaccine at local pharmacy or Health Dept. Verbalized acceptance and understanding.  Screening Tests Health Maintenance  Topic Date Due   Diabetic kidney evaluation - Urine ACR  03/21/2020   COVID-19 Vaccine (4 - 2024-25 season) 07/05/2023   HEMOGLOBIN A1C  10/28/2024   FOOT EXAM  02/09/2025   OPHTHALMOLOGY EXAM  02/10/2025   Diabetic kidney evaluation - eGFR measurement  04/28/2025   Medicare Annual Wellness (AWV)  05/03/2025   Colonoscopy  12/25/2031   DTaP/Tdap/Td (2 - Td or Tdap) 03/12/2033   Pneumococcal Vaccine: 50+ Years  Completed   Hepatitis C Screening  Completed   Hepatitis B Vaccines  Aged Out   HPV VACCINES  Aged Out   Meningococcal B Vaccine  Aged Out   INFLUENZA VACCINE  Discontinued   Zoster Vaccines-  Shingrix  Discontinued    Health Maintenance  Health Maintenance Due  Topic Date Due   Diabetic kidney evaluation - Urine ACR  03/21/2020   COVID-19 Vaccine (4 - 2024-25 season) 07/05/2023    Colorectal cancer screening: Type of screening: Colonoscopy. Completed 2023. Repeat every 10 years  Lung Cancer Screening: (Low Dose CT Chest recommended if Age 80-80 years, 20 pack-year currently smoking OR have quit w/in 15years.) does not qualify.   Lung Cancer Screening Referral:   Additional Screening:  Hepatitis C Screening: does not qualify; Completed 2016  Vision Screening: Recommended annual ophthalmology exams for early detection of glaucoma and other disorders of the eye. Is the patient up to date with their annual eye exam?  Yes  Who is the provider or what is the name of the office in which the patient attends annual eye exams? [facility] If pt is not established with a provider, would they like to be referred to a provider to establish care? No .  Dental Screening: Recommended annual dental exams for proper oral hygiene  Nutrition Risk Assessment:  Has the patient had any N/V/D within the last 2 months?  No  Does the patient have any non-healing wounds?  No  Has the patient had any unintentional weight loss or weight gain?  No   Diabetes:  Is the patient diabetic?  Yes  If diabetic, was a CBG obtained today?  No  Did the patient bring in their glucometer from home?  No  How often do you monitor your CBG's? 2-3  x a month.   Financial Strains and Diabetes Management:  Are you having any financial strains with the device, your supplies or your medication? No .  Does the patient want to be seen by Chronic Care Management for management of their diabetes?  No  Would the patient like to be referred to a Nutritionist or for Diabetic Management?  No   Diabetic Exams:  Diabetic Eye Exam: Completed . Pt has been advised about the importance in completing this exam.   Diabetic  Foot Exam: . Pt has been advised about the importance in completing this exam. .    Community Resource Referral / Chronic Care Management: CRR required this visit?  No   CCM required this visit?  No     Plan:     I have personally reviewed and noted the following in the patient's chart:   Medical and social history Use of alcohol, tobacco or illicit drugs  Current medications and supplements including opioid prescriptions. Patient is not currently taking opioid prescriptions. Functional ability and status Nutritional status Physical activity Advanced directives List of other physicians Hospitalizations, surgeries, and ER visits in previous 12 months Vitals Screenings to include cognitive, depression, and falls Referrals and appointments  In addition, I have reviewed and discussed with patient certain preventive protocols, quality metrics, and best practice recommendations. A written personalized care plan for preventive services as well as general preventive health recommendations were provided to patient.     Mliss Graff, LPN   12/11/7972   After Visit Summary: (MyChart) Due to this being a telephonic visit, the after visit summary with patients personalized plan was offered to patient via MyChart   Nurse Notes:

## 2024-05-03 NOTE — Patient Instructions (Signed)
 Edward Briggs , Thank you for taking time to come for your Medicare Wellness Visit. I appreciate your ongoing commitment to your health goals. Please review the following plan we discussed and let me know if I can assist you in the future.   Screening recommendations/referrals: Colonoscopy: up to date Recommended yearly ophthalmology/optometry visit for glaucoma screening and checkup Recommended yearly dental visit for hygiene and checkup  Vaccinations: Influenza vaccine: up to date Pneumococcal vaccine: up to date Tdap vaccine: Education provided       Preventive Care 65 Years and Older, Male Preventive care refers to lifestyle choices and visits with your health care provider that can promote health and wellness. What does preventive care include? A yearly physical exam. This is also called an annual well check. Dental exams once or twice a year. Routine eye exams. Ask your health care provider how often you should have your eyes checked. Personal lifestyle choices, including: Daily care of your teeth and gums. Regular physical activity. Eating a healthy diet. Avoiding tobacco and drug use. Limiting alcohol use. Practicing safe sex. Taking low doses of aspirin  every day. Taking vitamin and mineral supplements as recommended by your health care provider. What happens during an annual well check? The services and screenings done by your health care provider during your annual well check will depend on your age, overall health, lifestyle risk factors, and family history of disease. Counseling  Your health care provider may ask you questions about your: Alcohol use. Tobacco use. Drug use. Emotional well-being. Home and relationship well-being. Sexual activity. Eating habits. History of falls. Memory and ability to understand (cognition). Work and work Astronomer. Screening  You may have the following tests or measurements: Height, weight, and BMI. Blood pressure. Lipid and  cholesterol levels. These may be checked every 5 years, or more frequently if you are over 73 years old. Skin check. Lung cancer screening. You may have this screening every year starting at age 73 if you have a 30-pack-year history of smoking and currently smoke or have quit within the past 15 years. Fecal occult blood test (FOBT) of the stool. You may have this test every year starting at age 73. Flexible sigmoidoscopy or colonoscopy. You may have a sigmoidoscopy every 5 years or a colonoscopy every 10 years starting at age 73. Prostate cancer screening. Recommendations will vary depending on your family history and other risks. Hepatitis C blood test. Hepatitis B blood test. Sexually transmitted disease (STD) testing. Diabetes screening. This is done by checking your blood sugar (glucose) after you have not eaten for a while (fasting). You may have this done every 1-3 years. Abdominal aortic aneurysm (AAA) screening. You may need this if you are a current or former smoker. Osteoporosis. You may be screened starting at age 73 if you are at high risk. Talk with your health care provider about your test results, treatment options, and if necessary, the need for more tests. Vaccines  Your health care provider may recommend certain vaccines, such as: Influenza vaccine. This is recommended every year. Tetanus, diphtheria, and acellular pertussis (Tdap, Td) vaccine. You may need a Td booster every 10 years. Zoster vaccine. You may need this after age 73. Pneumococcal 13-valent conjugate (PCV13) vaccine. One dose is recommended after age 73. Pneumococcal polysaccharide (PPSV23) vaccine. One dose is recommended after age 73. Talk to your health care provider about which screenings and vaccines you need and how often you need them. This information is not intended to replace advice given to  you by your health care provider. Make sure you discuss any questions you have with your health care  provider. Document Released: 11/16/2015 Document Revised: 07/09/2016 Document Reviewed: 08/21/2015 Elsevier Interactive Patient Education  2017 [employer].  Fall Prevention in the Home Falls can cause injuries. They can happen to people of all ages. There are many things you can do to make your home safe and to help prevent falls. What can I do on the outside of my home? Regularly fix the edges of walkways and driveways and fix any cracks. Remove anything that might make you trip as you walk through a door, such as a raised step or threshold. Trim any bushes or trees on the path to your home. Use bright outdoor lighting. Clear any walking paths of anything that might make someone trip, such as rocks or tools. Regularly check to see if handrails are loose or broken. Make sure that both sides of any steps have handrails. Any raised decks and porches should have guardrails on the edges. Have any leaves, snow, or ice cleared regularly. Use sand or salt on walking paths during winter. Clean up any spills in your garage right away. This includes oil or grease spills. What can I do in the bathroom? Use night lights. Install grab bars by the toilet and in the tub and shower. Do not use towel bars as grab bars. Use non-skid mats or decals in the tub or shower. If you need to sit down in the shower, use a plastic, non-slip stool. Keep the floor dry. Clean up any water that spills on the floor as soon as it happens. Remove soap buildup in the tub or shower regularly. Attach bath mats securely with double-sided non-slip rug tape. Do not have throw rugs and other things on the floor that can make you trip. What can I do in the bedroom? Use night lights. Make sure that you have a light by your bed that is easy to reach. Do not use any sheets or blankets that are too big for your bed. They should not hang down onto the floor. Have a firm chair that has side arms. You can use this for support while  you get dressed. Do not have throw rugs and other things on the floor that can make you trip. What can I do in the kitchen? Clean up any spills right away. Avoid walking on wet floors. Keep items that you use a lot in easy-to-reach places. If you need to reach something above you, use a strong step stool that has a grab bar. Keep electrical cords out of the way. Do not use floor polish or wax that makes floors slippery. If you must use wax, use non-skid floor wax. Do not have throw rugs and other things on the floor that can make you trip. What can I do with my stairs? Do not leave any items on the stairs. Make sure that there are handrails on both sides of the stairs and use them. Fix handrails that are broken or loose. Make sure that handrails are as long as the stairways. Check any carpeting to make sure that it is firmly attached to the stairs. Fix any carpet that is loose or worn. Avoid having throw rugs at the top or bottom of the stairs. If you do have throw rugs, attach them to the floor with carpet tape. Make sure that you have a light switch at the top of the stairs and the bottom of the stairs.  If you do not have them, ask someone to add them for you. What else can I do to help prevent falls? Wear shoes that: Do not have high heels. Have rubber bottoms. Are comfortable and fit you well. Are closed at the toe. Do not wear sandals. If you use a stepladder: Make sure that it is fully opened. Do not climb a closed stepladder. Make sure that both sides of the stepladder are locked into place. Ask someone to hold it for you, if possible. Clearly mark and make sure that you can see: Any grab bars or handrails. First and last steps. Where the edge of each step is. Use tools that help you move around (mobility aids) if they are needed. These include: Canes. Walkers. Scooters. Crutches. Turn on the lights when you go into a dark area. Replace any light bulbs as soon as they burn  out. Set up your furniture so you have a clear path. Avoid moving your furniture around. If any of your floors are uneven, fix them. If there are any pets around you, be aware of where they are. Review your medicines with your doctor. Some medicines can make you feel dizzy. This can increase your chance of falling. Ask your doctor what other things that you can do to help prevent falls. This information is not intended to replace advice given to you by your health care provider. Make sure you discuss any questions you have with your health care provider. Document Released: 08/16/2009 Document Revised: 03/27/2016 Document Reviewed: 11/24/2014 Elsevier Interactive Patient Education  2017 [employer].

## 2024-05-04 ENCOUNTER — Telehealth: Payer: Self-pay

## 2024-05-04 ENCOUNTER — Other Ambulatory Visit: Payer: Self-pay | Admitting: Neurology

## 2024-05-04 NOTE — Telephone Encounter (Signed)
 Call to patient, she states husband is not home. She states she thinks the amitriptyline  may be helping a little with the salivation. She asked me to refuse the refill request and she will call and let us  know if they wish to continue it.

## 2024-05-05 ENCOUNTER — Encounter: Payer: Self-pay | Admitting: Family Medicine

## 2024-05-05 ENCOUNTER — Ambulatory Visit: Admitting: Family Medicine

## 2024-05-05 ENCOUNTER — Ambulatory Visit (INDEPENDENT_AMBULATORY_CARE_PROVIDER_SITE_OTHER)

## 2024-05-05 VITALS — BP 138/70 | HR 94 | Temp 98.9°F | Resp 16 | Ht 73.0 in | Wt 233.0 lb

## 2024-05-05 DIAGNOSIS — Z794 Long term (current) use of insulin: Secondary | ICD-10-CM

## 2024-05-05 DIAGNOSIS — T63441A Toxic effect of venom of bees, accidental (unintentional), initial encounter: Secondary | ICD-10-CM

## 2024-05-05 DIAGNOSIS — E1165 Type 2 diabetes mellitus with hyperglycemia: Secondary | ICD-10-CM | POA: Diagnosis not present

## 2024-05-05 DIAGNOSIS — R32 Unspecified urinary incontinence: Secondary | ICD-10-CM | POA: Diagnosis not present

## 2024-05-05 DIAGNOSIS — M7989 Other specified soft tissue disorders: Secondary | ICD-10-CM | POA: Diagnosis not present

## 2024-05-05 LAB — MICROALBUMIN / CREATININE URINE RATIO
Creatinine,U: 152.2 mg/dL
Microalb Creat Ratio: 5.5 mg/g (ref 0.0–30.0)
Microalb, Ur: 0.8 mg/dL (ref 0.0–1.9)

## 2024-05-05 MED ORDER — TRIAMCINOLONE ACETONIDE 0.1 % EX CREA: 1.0000 | TOPICAL_CREAM | Freq: Two times a day (BID) | CUTANEOUS | 0 refills | Status: AC

## 2024-05-05 NOTE — Progress Notes (Signed)
 Patient dropped off sample this morning due to non collect during last visit

## 2024-05-05 NOTE — Progress Notes (Signed)
 Subjective:  Patient ID: Edward Briggs, male    DOB: 1951/02/12  Age: 73 y.o. MRN: [id number]  CC:  Chief Complaint  Patient presents with   Insect Bite    Pt walked out of the house grabbed the railing and got stung by a bee, yesterday afternoon, there is significant swelling in the Rt hand    HPI Edward Briggs presents for   Bee sting of right hand Occurred yesterday, grabbing a railing and got stung by bee on top of hand - between thumb and first finger.  No allergy. Swelling noted in hand since sting. No rash/hives, wheezing, trouble swallowing or other systemic symptoms.  Swelling today about the same as yesterday.   Tx: tobacco, soap/water cleanse. Tylenol .   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    Past Surgical History:  Procedure Laterality Date   ABSCESS DRAINAGE     COLONOSCOPY     ~ 10 yrs ago   FRACTURE SURGERY     HARDWARE REMOVAL  03/26/2012   Procedure: HARDWARE REMOVAL;  Surgeon: Deward DELENA Schwartz, MD;  Location: [facility];  Service: Orthopedics;  Laterality: Left;  hardware removal deep left ankle syndesmotic screws only and stress xrays ankle    ORIF ANKLE FRACTURE  12/03/2011   Procedure: OPEN REDUCTION INTERNAL FIXATION (ORIF) ANKLE FRACTURE;  Surgeon: Deward DELENA Schwartz, MD;  Location: [facility];  Service: Orthopedics;  Laterality: Left;  left medial malleolus fracture   PILONIDAL CYST EXCISION   ~1982   SKIN GRAFT  ~1970   Lt great toe (graft from Lt thigh)   No Known Allergies Prior to Admission medications   Medication Sig Start Date End Date Taking? Authorizing Provider  Accu-Chek Softclix Lancets lancets USE AS DIRECTED UP TO 4 TIMES DAILY 01/22/22  Yes Levora Reyes SAUNDERS, MD  amitriptyline  (ELAVIL ) 10 MG tablet Take 1 tablet (10 mg total) by mouth at bedtime. 04/04/24  Yes Gregg Lek, MD  aspirin  81 MG tablet Take 81 mg by mouth every 7 (seven) days.   Yes [provider]  atorvastatin  (LIPITOR) 10 MG tablet Take 1 tablet by mouth once daily 04/01/24  Yes Levora Reyes SAUNDERS, MD  blood glucose meter kit and supplies Dispense based on patient and insurance preference. Use up to four times daily as directed. (FOR ICD-10 E10.9, E11.9). 11/25/21  Yes Levora Reyes SAUNDERS, MD  Blood Glucose Monitoring Suppl (BLOOD GLUCOSE METER) kit Use as instructed 09/10/12  Yes Tish Alm DEL, MD  carbidopa -levodopa  (SINEMET  IR) 25-100 MG tablet Take 1 tablet by mouth 4 (four) times daily. Take 1 tablet by mouth at 11 AM, 2 PM, 5 PM, and at bedtime 04/04/24 03/30/25 Yes Camara, Amadou, MD  donepezil  (ARICEPT ) 10 MG tablet TAKE 1 TABLET BY MOUTH AT BEDTIME 02/18/24  Yes Camara, Amadou, MD  glucose blood test strip Check blood sugar 3 times daily 11/25/21  Yes Levora Reyes  R, MD  Incontinence Supply Disposable (DEPEND EASY FIT UNDERGARMENTS) MISC 1 Application by Does not apply route as needed. 04/19/24  Yes Levora Reyes SAUNDERS, MD  Insulin  Pen Needle (BD PEN NEEDLE NANO U/F) 32G X 4 MM MISC USE DAILY AS DIRECTED 03/28/21  Yes Levora Reyes SAUNDERS, MD  LANTUS  SOLOSTAR 100 UNIT/ML Solostar Pen INJECT 34 UNITS SUBCUTANEOUSLY ONCE DAILY 03/09/24  Yes Levora Reyes SAUNDERS, MD  lisinopril  (ZESTRIL ) 5 MG tablet Take 1 tablet by mouth once daily 04/05/24  Yes Levora Reyes SAUNDERS, MD  Metformin  HCl 500 MG/5ML SOLN TAKE 10 ML BY MOUTH  TWICE DAILY . APPOINTMENT REQUIRED FOR FUTURE REFILLS 04/27/24  Yes Levora Reyes SAUNDERS, MD   TYLENOL  500 MG tablet Take 500-1,000 mg by mouth every 6 (six) hours as needed for mild pain (pain score 1-3) or headache.   Yes [provider]   Social History   Socioeconomic History   Marital status: Married    Spouse name: Not on file   Number of children: 2   Years of education: Not on file   Highest education level: Not on file  Occupational History   Occupation: retired  Tobacco Use   Smoking status: Former    Current packs/day: 0.00    Types: Cigarettes    Quit date: 10/30/1988    Years since quitting: 35.5   Smokeless tobacco: Never  Vaping Use   Vaping status: Never Used  Substance and Sexual Activity   Alcohol use: Yes    Alcohol/week: 1.0 standard drink of alcohol    Types: 1 Shots of liquor per week    Comment: every two weeks   Drug use: No   Sexual activity: Yes    Birth control/protection: None  Other Topics Concern   Not on file  Social History Narrative   Not on file   Social Drivers of Health   Financial Resource Strain: Low Risk  (03/25/2023)   Overall Financial Resource Strain (CARDIA)    Difficulty of Paying Living Expenses: Not hard at all  Food Insecurity: No Food Insecurity (05/03/2024)   Hunger Vital Sign    Worried About Running Out of Food in the Last Year: Never true    Ran Out of Food in the Last Year: Never true  Transportation Needs: No Transportation Needs (12/17/2023)   PRAPARE - Administrator, Civil Service (Medical): No    Lack of Transportation (Non-Medical): No  Physical Activity: Inactive (05/03/2024)   Exercise Vital Sign    Days of Exercise per Week: 0 days    Minutes of Exercise per Session: 0 min  Stress: No Stress Concern Present (03/25/2023)   [employer] of Occupational Health - Occupational Stress Questionnaire    Feeling of Stress : Not at all  Social Connections: Moderately Integrated (05/03/2024)   Social Connection and Isolation Panel    Frequency of Communication with Friends and Family:  More than three times a week    Frequency of Social Gatherings with Friends and Family: Once a week    Attends Religious Services: More than 4 times per year    Active Member of [employer] or Organizations: No    Attends Banker Meetings: Never    Marital Status: Married  Catering manager Violence: Not At Risk (12/17/2023)   Humiliation, Afraid, Rape, and Kick questionnaire    Fear of Current or Ex-Partner: No    Emotionally Abused: No    Physically Abused: No    Sexually Abused: No  Review of Systems   Objective:   Vitals:   05/05/24 1145  BP: 138/70  Pulse: 94  Resp: 16  Temp: 98.9 F (37.2 C)  TempSrc: Temporal  SpO2: 98%  Weight: 233 lb (105.7 kg)  Height: 6' 1 (1.854 m)     Physical Exam Vitals reviewed.  Constitutional:      Appearance: He is well-developed.  HENT:     Head: Normocephalic and atraumatic.  Neck:     Vascular: No carotid bruit or JVD.  Cardiovascular:     Rate and Rhythm: Normal rate and regular rhythm.     Heart sounds: Normal heart sounds. No murmur heard. Pulmonary:     Effort: Pulmonary effort is normal. No respiratory distress.     Breath sounds: Normal breath sounds. No stridor. No wheezing or rales.  Musculoskeletal:       Arms:     Right lower leg: No edema.     Left lower leg: No edema.  Skin:    General: Skin is warm and dry.     Findings: No rash.     Comments: No hives.   Neurological:     Mental Status: He is alert and oriented to person, place, and time.  Psychiatric:        Mood and Affect: Mood normal.         Assessment & Plan:  KAGAN MUTCHLER is a 73 y.o. male . Bee sting, accidental or unintentional, initial encounter - Plan: triamcinolone  cream (KENALOG ) 0.1 %  Hand swelling - Plan: triamcinolone  cream (KENALOG ) 0.1 % Bee sting yesterday with immediate hand swelling, similar swelling today as yesterday.  Some swelling into the forearm which may be dependent from having hand elevated versus large  local reaction.  No systemic symptoms or sign of allergic reaction.  No history of bee sting allergy.  Symptomatic care discussed for now with cold compresses, topical steroid cream if needed for itching, option of Zyrtec but that may be more problematic with his other medications, and anticholinergic side effects discussed.  Start with topical treatment initially.  Will hold on systemic prednisone  as well given possible side effects and start with topical treatments as above.  ER precautions given if any acute or worsening symptoms but I do not expect that to occur.  As far as large local reaction and possible anaphylaxis or severe allergic reaction in the future, we discussed this would be a very low risk based on review of information through up-to-date but ER precautions were discussed if that were to occur.  Hold on EpiPen  for now.  All questions answered.  RTC precautions discussed.  Meds ordered this encounter  Medications   triamcinolone  cream (KENALOG ) 0.1 %    Sig: Apply 1 Application topically 2 (two) times daily.    Dispense:  30 g    Refill:  0   Patient Instructions  As we discussed the swelling in the hand is likely due to the bee sting, but there may be a component of a large local reaction from that bee sting to cause the swelling up into the arm.  Okay to use Tylenol  as needed for pain.  Cold compresses can be helpful for pain and any itching.  If you have significant itching you can either use topical steroid cream I prescribed, or could consider an antihistamine like Zyrtec, but you may have some side effects with your other medications and Zyrtec.  I would start with the topical streoid treatment first.  On my further review review of recommendations today, it does not look like we need to prescribe an EpiPen  as the risk of a serious allergic reaction in the future would be very low.  However if you do get stung by bee again and have any hives, rash, swelling in areas other than  where you were stung or any respiratory symptoms like wheezing or shortness of breath, be seen in the emergency room immediately or call 911.  Let me know if there are questions.  Bee, Wasp, or [employer], Adult Bees, wasps, and hornets are part of a family of insects that sting. Normally, a sting will cause pain, redness, and swelling at the sting site. However, some people have an allergy to these stings, and their reactions can be much more serious. What increases the risk? You may be at a greater risk of getting stung if you: Provoke a stinging insect by swatting or disturbing it. Wear strong-smelling soaps, deodorants, or body sprays. Spend time outdoors near gardens with flowers or fruit trees or in clothes that expose skin. Eat or drink outside. What are the signs or symptoms? The reaction to an insect sting can vary from a mild, normal response to life-threatening anaphylaxis. The sting site is often a red lump in the skin, sometimes with a tiny hole in the center, that may still have the stinger in the center of the wound. Normal reaction A normal reaction is experienced by most people after an insect sting. Symptoms include: Pain, redness, and swelling at the sting site. These can develop over 24-48 hours. Pain, redness, and swelling that may spread to a larger, connected area beyond the sting site. The spreading can continue over 24-48 hours. Allergic reaction An allergic reaction can vary in severity and includes symptoms in other areas of the body beyond the sting site. People who experience an allergic reaction have a higher risk of having similar or worse symptoms the next time they are stung. Symptoms may include: Hives, itching, and swelling. Abdominal symptoms including cramping, nausea, vomiting, and diarrhea. Severe symptoms that require immediate medical attention include: Chest pain or tightness. Wheezing or trouble breathing. Swelling of the tongue, throat, or  lips. Trouble swallowing or hoarse voice. Anaphylactic reaction An anaphylactic reaction is a severe, life-threatening allergy and requires immediate medical attention. The symptoms often include severe allergic reaction symptoms that develop rapidly and lead to: A sudden and sharp drop in blood pressure. Dizziness. Loss of consciousness. How is this diagnosed? This condition is usually diagnosed based on your symptoms and medical history as well as a physical exam. You may have an allergy test to determine if you are allergic to the insect venom. How is this treated? If you were stung by a bee, the stinger and a small sac of venom may be in the wound. Remove the stinger as soon as possible. Do this by brushing across the wound with gauze, a clean fingernail, or a flat card such as a credit card. This can help reduce the severity of your body's reaction to the sting. Normal reactions can be treated with: Washing the area thoroughly with soap and water. Applying ice to the area to reduce swelling. Oral or topical medicines to help reduce pain and itching, if present. Pay close attention to your symptoms after you have been stung. If possible, have someone stay with you to see if an allergic reaction develops. If allergic symptoms develop, oral antihistamines can be taken and you will need medical  help right away. If you had an allergic reaction before, you may need to: Use an auto-injector pen(pre-filled automatic epinephrine  injection device)at the first sign of an allergic reaction. Get medical help right away because the epinephrine  is short-acting. It is intended to give you more time to get to an emergency room. Follow these instructions at home:  Wash the sting site 2-3 times a day with soap and water as told by your health care provider. Apply or take over-the-counter and prescription medicines only as told by your health care provider. If directed, apply ice to the sting area. Put  ice in a plastic bag. Place a towel between your skin and the bag. Leave the ice on for 20 minutes, 2-3 times a day. Do not scratch the sting area. If you had a severe allergic reaction to a sting, you may need to: Wear a medical bracelet or necklace that lists the allergy. Carry an anaphylaxis kit or an epinephrine  auto-injector pen with you at all times. Tell your family members, friends, and coworkers when and how to use it. Use it at the first sign of an allergic reaction. How is this prevented? Avoid swatting at stinging insects and disturbing insect nests. Do not use fragrant soaps or lotions and avoid sitting near flowering plants, if possible. Wear shoes, pants, and long sleeves when spending time outdoors, especially in grassy areas where stinging insects are common. Keep outdoor areas free from nests or hives. Keep food and drink containers covered when eating outdoors. Wear gloves if you are gardening or working outdoors. Find a barrier between you and the insect(s), such as a door, if an attack by a stinging insect or a swarm seems likely. Contact a health care provider if: Your symptoms do not get better in 2-3 days. You have redness, swelling, or pain that spreads beyond the area of the sting. You have a fever. Get help right away if: You have symptoms of a severe allergic reaction. These include: Chest tightness or pain. Wheezing, or trouble swallowing or breathing. Light-headedness, dizziness, or fainting. Itchy, raised, red patches on the skin beyond the sting site. Abdominal cramping, nausea, vomiting, or diarrhea. Trouble swallowing or a swollen tongue, throat, or lips. These symptoms may be an emergency. Get help right away. Call 911. Do not wait to see if the symptoms will go away. Do not drive yourself to the hospital. Summary Stings from bees, wasps, and hornets can cause pain and swelling, but they are usually not serious. However, some people may have an  allergic reaction to a sting. This can cause the symptoms to be more severe. Pay close attention to your symptoms after you have been stung. If possible, have someone stay with you to look for signs of worsening symptoms. Call your health care provider if you have any signs of an allergic reaction. This information is not intended to replace advice given to you by your health care provider. Make sure you discuss any questions you have with your health care provider. Document Revised: 12/17/2021 Document Reviewed: 12/17/2021 Elsevier Patient Education  2024 Elsevier Inc.    Signed,   Reyes Pines, MD [facility], [facility] 05/05/24 12:32 PM

## 2024-05-05 NOTE — Patient Instructions (Addendum)
 As we discussed the swelling in the hand is likely due to the bee sting, but there may be a component of a large local reaction from that bee sting to cause the swelling up into the arm.  Okay to use Tylenol  as needed for pain.  Cold compresses can be helpful for pain and any itching.  If you have significant itching you can either use topical steroid cream I prescribed, or could consider an antihistamine like Zyrtec, but you may have some side effects with your other medications and Zyrtec.  I would start with the topical streoid treatment first.  On my further review review of recommendations today, it does not look like we need to prescribe an EpiPen  as the risk of a serious allergic reaction in the future would be very low.  However if you do get stung by bee again and have any hives, rash, swelling in areas other than where you were stung or any respiratory symptoms like wheezing or shortness of breath, be seen in the emergency room immediately or call 911.  Let me know if there are questions.  Bee, Wasp, or [employer], Adult Bees, wasps, and hornets are part of a family of insects that sting. Normally, a sting will cause pain, redness, and swelling at the sting site. However, some people have an allergy to these stings, and their reactions can be much more serious. What increases the risk? You may be at a greater risk of getting stung if you: Provoke a stinging insect by swatting or disturbing it. Wear strong-smelling soaps, deodorants, or body sprays. Spend time outdoors near gardens with flowers or fruit trees or in clothes that expose skin. Eat or drink outside. What are the signs or symptoms? The reaction to an insect sting can vary from a mild, normal response to life-threatening anaphylaxis. The sting site is often a red lump in the skin, sometimes with a tiny hole in the center, that may still have the stinger in the center of the wound. Normal reaction A normal reaction is experienced  by most people after an insect sting. Symptoms include: Pain, redness, and swelling at the sting site. These can develop over 24-48 hours. Pain, redness, and swelling that may spread to a larger, connected area beyond the sting site. The spreading can continue over 24-48 hours. Allergic reaction An allergic reaction can vary in severity and includes symptoms in other areas of the body beyond the sting site. People who experience an allergic reaction have a higher risk of having similar or worse symptoms the next time they are stung. Symptoms may include: Hives, itching, and swelling. Abdominal symptoms including cramping, nausea, vomiting, and diarrhea. Severe symptoms that require immediate medical attention include: Chest pain or tightness. Wheezing or trouble breathing. Swelling of the tongue, throat, or lips. Trouble swallowing or hoarse voice. Anaphylactic reaction An anaphylactic reaction is a severe, life-threatening allergy and requires immediate medical attention. The symptoms often include severe allergic reaction symptoms that develop rapidly and lead to: A sudden and sharp drop in blood pressure. Dizziness. Loss of consciousness. How is this diagnosed? This condition is usually diagnosed based on your symptoms and medical history as well as a physical exam. You may have an allergy test to determine if you are allergic to the insect venom. How is this treated? If you were stung by a bee, the stinger and a small sac of venom may be in the wound. Remove the stinger as soon as possible. Do this by brushing  across the wound with gauze, a clean fingernail, or a flat card such as a credit card. This can help reduce the severity of your body's reaction to the sting. Normal reactions can be treated with: Washing the area thoroughly with soap and water. Applying ice to the area to reduce swelling. Oral or topical medicines to help reduce pain and itching, if present. Pay close attention to  your symptoms after you have been stung. If possible, have someone stay with you to see if an allergic reaction develops. If allergic symptoms develop, oral antihistamines can be taken and you will need medical help right away. If you had an allergic reaction before, you may need to: Use an auto-injector pen(pre-filled automatic epinephrine  injection device)at the first sign of an allergic reaction. Get medical help right away because the epinephrine  is short-acting. It is intended to give you more time to get to an emergency room. Follow these instructions at home:  Wash the sting site 2-3 times a day with soap and water as told by your health care provider. Apply or take over-the-counter and prescription medicines only as told by your health care provider. If directed, apply ice to the sting area. Put ice in a plastic bag. Place a towel between your skin and the bag. Leave the ice on for 20 minutes, 2-3 times a day. Do not scratch the sting area. If you had a severe allergic reaction to a sting, you may need to: Wear a medical bracelet or necklace that lists the allergy. Carry an anaphylaxis kit or an epinephrine  auto-injector pen with you at all times. Tell your family members, friends, and coworkers when and how to use it. Use it at the first sign of an allergic reaction. How is this prevented? Avoid swatting at stinging insects and disturbing insect nests. Do not use fragrant soaps or lotions and avoid sitting near flowering plants, if possible. Wear shoes, pants, and long sleeves when spending time outdoors, especially in grassy areas where stinging insects are common. Keep outdoor areas free from nests or hives. Keep food and drink containers covered when eating outdoors. Wear gloves if you are gardening or working outdoors. Find a barrier between you and the insect(s), such as a door, if an attack by a stinging insect or a swarm seems likely. Contact a health care provider  if: Your symptoms do not get better in 2-3 days. You have redness, swelling, or pain that spreads beyond the area of the sting. You have a fever. Get help right away if: You have symptoms of a severe allergic reaction. These include: Chest tightness or pain. Wheezing, or trouble swallowing or breathing. Light-headedness, dizziness, or fainting. Itchy, raised, red patches on the skin beyond the sting site. Abdominal cramping, nausea, vomiting, or diarrhea. Trouble swallowing or a swollen tongue, throat, or lips. These symptoms may be an emergency. Get help right away. Call 911. Do not wait to see if the symptoms will go away. Do not drive yourself to the hospital. Summary Stings from bees, wasps, and hornets can cause pain and swelling, but they are usually not serious. However, some people may have an allergic reaction to a sting. This can cause the symptoms to be more severe. Pay close attention to your symptoms after you have been stung. If possible, have someone stay with you to look for signs of worsening symptoms. Call your health care provider if you have any signs of an allergic reaction. This information is not intended to replace advice given  to you by your health care provider. Make sure you discuss any questions you have with your health care provider. Document Revised: 12/17/2021 Document Reviewed: 12/17/2021 Elsevier Patient Education  2024 [employer].

## 2024-05-06 ENCOUNTER — Ambulatory Visit: Payer: Self-pay | Admitting: Family Medicine

## 2024-05-09 ENCOUNTER — Telehealth: Payer: Self-pay | Admitting: Neurology

## 2024-05-09 NOTE — Telephone Encounter (Signed)
 Wife asked of pt's 1st appointment with Dr Gregg

## 2024-05-09 NOTE — Progress Notes (Signed)
 Pt has been notified.

## 2024-05-11 DIAGNOSIS — I10 Essential (primary) hypertension: Secondary | ICD-10-CM | POA: Diagnosis not present

## 2024-05-11 DIAGNOSIS — Z7984 Long term (current) use of oral hypoglycemic drugs: Secondary | ICD-10-CM | POA: Diagnosis not present

## 2024-05-11 DIAGNOSIS — Z794 Long term (current) use of insulin: Secondary | ICD-10-CM | POA: Diagnosis not present

## 2024-05-11 DIAGNOSIS — E785 Hyperlipidemia, unspecified: Secondary | ICD-10-CM | POA: Diagnosis not present

## 2024-05-11 DIAGNOSIS — G20A1 Parkinson's disease without dyskinesia, without mention of fluctuations: Secondary | ICD-10-CM | POA: Diagnosis not present

## 2024-05-11 DIAGNOSIS — G9341 Metabolic encephalopathy: Secondary | ICD-10-CM | POA: Diagnosis not present

## 2024-05-11 DIAGNOSIS — M109 Gout, unspecified: Secondary | ICD-10-CM | POA: Diagnosis not present

## 2024-05-11 DIAGNOSIS — Z9181 History of falling: Secondary | ICD-10-CM | POA: Diagnosis not present

## 2024-05-11 DIAGNOSIS — Z556 Problems related to health literacy: Secondary | ICD-10-CM | POA: Diagnosis not present

## 2024-05-11 DIAGNOSIS — D638 Anemia in other chronic diseases classified elsewhere: Secondary | ICD-10-CM | POA: Diagnosis not present

## 2024-05-11 DIAGNOSIS — E119 Type 2 diabetes mellitus without complications: Secondary | ICD-10-CM | POA: Diagnosis not present

## 2024-05-11 DIAGNOSIS — Z7982 Long term (current) use of aspirin: Secondary | ICD-10-CM | POA: Diagnosis not present

## 2024-05-17 ENCOUNTER — Telehealth: Payer: Self-pay

## 2024-05-17 ENCOUNTER — Telehealth: Payer: Self-pay | Admitting: Family Medicine

## 2024-05-17 DIAGNOSIS — Z794 Long term (current) use of insulin: Secondary | ICD-10-CM | POA: Diagnosis not present

## 2024-05-17 DIAGNOSIS — M109 Gout, unspecified: Secondary | ICD-10-CM | POA: Diagnosis not present

## 2024-05-17 DIAGNOSIS — D638 Anemia in other chronic diseases classified elsewhere: Secondary | ICD-10-CM | POA: Diagnosis not present

## 2024-05-17 DIAGNOSIS — Z556 Problems related to health literacy: Secondary | ICD-10-CM | POA: Diagnosis not present

## 2024-05-17 DIAGNOSIS — I10 Essential (primary) hypertension: Secondary | ICD-10-CM | POA: Diagnosis not present

## 2024-05-17 DIAGNOSIS — E119 Type 2 diabetes mellitus without complications: Secondary | ICD-10-CM | POA: Diagnosis not present

## 2024-05-17 DIAGNOSIS — Z7984 Long term (current) use of oral hypoglycemic drugs: Secondary | ICD-10-CM | POA: Diagnosis not present

## 2024-05-17 DIAGNOSIS — E785 Hyperlipidemia, unspecified: Secondary | ICD-10-CM | POA: Diagnosis not present

## 2024-05-17 DIAGNOSIS — Z9181 History of falling: Secondary | ICD-10-CM | POA: Diagnosis not present

## 2024-05-17 DIAGNOSIS — G20A1 Parkinson's disease without dyskinesia, without mention of fluctuations: Secondary | ICD-10-CM | POA: Diagnosis not present

## 2024-05-17 NOTE — Telephone Encounter (Signed)
 Home Health Certification or Plan of Care Tracking  Is this a Certification or Plan of Care? Plan of Care  [facility] Agency: [facility]  Order Number:  [id number]  Has charge sheet been attached? Yes  Where has form been placed:   Labeled & placed in provider bin

## 2024-05-17 NOTE — Telephone Encounter (Signed)
 Copied from CRM [id number]. Topic: Clinical - Home Health Verbal Orders >> May 17, 2024 12:17 PM Drema MATSU wrote: Nicole wanted to notify physican that patient had a fall without injury on 07/10.

## 2024-05-18 NOTE — Telephone Encounter (Signed)
 Paperwork completed and placed in fax bin at back nurse station

## 2024-05-19 NOTE — Telephone Encounter (Signed)
 Faxed paperwork and sent documents off to be scanned into patients chart

## 2024-05-20 ENCOUNTER — Other Ambulatory Visit: Payer: Self-pay | Admitting: Neurology

## 2024-05-24 ENCOUNTER — Other Ambulatory Visit: Payer: Self-pay | Admitting: Family Medicine

## 2024-05-24 DIAGNOSIS — E1165 Type 2 diabetes mellitus with hyperglycemia: Secondary | ICD-10-CM

## 2024-05-26 ENCOUNTER — Telehealth: Payer: Self-pay

## 2024-05-26 DIAGNOSIS — G20A1 Parkinson's disease without dyskinesia, without mention of fluctuations: Secondary | ICD-10-CM | POA: Diagnosis not present

## 2024-05-26 DIAGNOSIS — E785 Hyperlipidemia, unspecified: Secondary | ICD-10-CM | POA: Diagnosis not present

## 2024-05-26 DIAGNOSIS — E119 Type 2 diabetes mellitus without complications: Secondary | ICD-10-CM | POA: Diagnosis not present

## 2024-05-26 DIAGNOSIS — D638 Anemia in other chronic diseases classified elsewhere: Secondary | ICD-10-CM | POA: Diagnosis not present

## 2024-05-26 DIAGNOSIS — I10 Essential (primary) hypertension: Secondary | ICD-10-CM | POA: Diagnosis not present

## 2024-05-26 DIAGNOSIS — Z9181 History of falling: Secondary | ICD-10-CM | POA: Diagnosis not present

## 2024-05-26 DIAGNOSIS — Z556 Problems related to health literacy: Secondary | ICD-10-CM | POA: Diagnosis not present

## 2024-05-26 DIAGNOSIS — Z7984 Long term (current) use of oral hypoglycemic drugs: Secondary | ICD-10-CM | POA: Diagnosis not present

## 2024-05-26 DIAGNOSIS — M109 Gout, unspecified: Secondary | ICD-10-CM | POA: Diagnosis not present

## 2024-05-26 DIAGNOSIS — Z794 Long term (current) use of insulin: Secondary | ICD-10-CM | POA: Diagnosis not present

## 2024-05-26 NOTE — Telephone Encounter (Signed)
 Noted, continue to monitor for any symptoms after fall and be seen if those occur.  Appreciate the notification.

## 2024-05-26 NOTE — Telephone Encounter (Signed)
 FYI for PCP patient had a fall on 05/24/2023 but on assessment with them there is no injury.

## 2024-05-26 NOTE — Telephone Encounter (Signed)
 Copied from CRM [id number]. Topic: Clinical - Home Health Verbal Orders >> May 26, 2024 10:56 AM Thersia BROCKS wrote: Ammie from [facility], Physical Therapist Assistant wanted to notify Dr.Greene that patient had a fall on 07/21 with no injury

## 2024-05-30 DIAGNOSIS — G20A1 Parkinson's disease without dyskinesia, without mention of fluctuations: Secondary | ICD-10-CM | POA: Diagnosis not present

## 2024-05-30 DIAGNOSIS — M109 Gout, unspecified: Secondary | ICD-10-CM | POA: Diagnosis not present

## 2024-05-30 DIAGNOSIS — Z7984 Long term (current) use of oral hypoglycemic drugs: Secondary | ICD-10-CM | POA: Diagnosis not present

## 2024-05-30 DIAGNOSIS — E119 Type 2 diabetes mellitus without complications: Secondary | ICD-10-CM | POA: Diagnosis not present

## 2024-05-30 DIAGNOSIS — I10 Essential (primary) hypertension: Secondary | ICD-10-CM | POA: Diagnosis not present

## 2024-05-30 DIAGNOSIS — E785 Hyperlipidemia, unspecified: Secondary | ICD-10-CM | POA: Diagnosis not present

## 2024-05-30 DIAGNOSIS — Z556 Problems related to health literacy: Secondary | ICD-10-CM | POA: Diagnosis not present

## 2024-05-30 DIAGNOSIS — Z9181 History of falling: Secondary | ICD-10-CM | POA: Diagnosis not present

## 2024-05-30 DIAGNOSIS — Z794 Long term (current) use of insulin: Secondary | ICD-10-CM | POA: Diagnosis not present

## 2024-05-30 DIAGNOSIS — D638 Anemia in other chronic diseases classified elsewhere: Secondary | ICD-10-CM | POA: Diagnosis not present

## 2024-05-31 ENCOUNTER — Ambulatory Visit (INDEPENDENT_AMBULATORY_CARE_PROVIDER_SITE_OTHER): Admitting: Student in an Organized Health Care Education/Training Program

## 2024-05-31 ENCOUNTER — Ambulatory Visit: Admitting: Podiatry

## 2024-05-31 ENCOUNTER — Encounter: Payer: Self-pay | Admitting: Student in an Organized Health Care Education/Training Program

## 2024-05-31 ENCOUNTER — Encounter: Payer: Self-pay | Admitting: Podiatry

## 2024-05-31 VITALS — BP 129/85 | HR 81 | Wt 235.0 lb

## 2024-05-31 DIAGNOSIS — M79675 Pain in left toe(s): Secondary | ICD-10-CM

## 2024-05-31 DIAGNOSIS — B351 Tinea unguium: Secondary | ICD-10-CM

## 2024-05-31 DIAGNOSIS — M79674 Pain in right toe(s): Secondary | ICD-10-CM | POA: Diagnosis not present

## 2024-05-31 DIAGNOSIS — E119 Type 2 diabetes mellitus without complications: Secondary | ICD-10-CM

## 2024-05-31 DIAGNOSIS — L720 Epidermal cyst: Secondary | ICD-10-CM | POA: Diagnosis not present

## 2024-05-31 NOTE — Assessment & Plan Note (Signed)
 Hard to tell what the small mass in his abdomen is because it is not present currently.  Exam is reassuring today.  No hernia felt in the abdominal wall.  No skin changes.  No tenderness on exam.  With the nature of a 1 cm dermal nodule that is intermittent, it sounds most consistent with a inclusion cyst.  The patient has other noticeable inclusion cysts along his beard line.  We talked about the nature of these cysts.  I recommended supportive care.  No need for antibiotics.  No need for incision and drainage.  His exam is otherwise reassuring.  I recommended continuing his usual medications.  I recommended avoiding the area of his upper abdomen when he does insulin  injections.

## 2024-05-31 NOTE — Progress Notes (Signed)
   Acute Office Visit  Subjective:     Patient ID: Edward Briggs, male    DOB: 07-Oct-1951, 73 y.o.   MRN: [id number]  Chief Complaint  Patient presents with   Cyst    Knot that comes and goes on the lower end of sternum.Patient states the knot is not there today and does not cause any pain.     HPI  Discussed the use of AI scribe software for clinical note transcription with the patient, who gave verbal consent to proceed.  History of Present Illness Edward Briggs is a 73 year old male with diabetes who presents with a recurring lump on his abdomen.  He has a recurring lump on his abdomen that has been present for about two weeks. The lump is rough, located beneath the skin on his belly, and comes and goes without a known cause. It does not cause any pain.  He is currently on insulin  for diabetes management, administering injections primarily in the lower abdomen area, but not in the specific area where the lump appears. No pain associated with the lump and no other significant changes in his health.  He denies any recent changes in his health, stating that he is eating and drinking well.      Objective:    BP 129/85   Pulse 81   Wt 235 lb (106.6 kg)   BMI 31.00 kg/m    Physical Exam  Gen: Well-appearing man Abd: Soft, nontender, no organomegaly, no hernia, the subxiphoid process is prominent Skin: Patient has a number of inclusion cysts on his face in his beard line.  The area of concern is in his epigastric upper abdomen.  The skin appears normal.  There is no erythema, induration, or tenderness.  I do not feel any nodule or mass within the dermis or soft tissues of the abdomen.       Assessment & Plan:   Problem List Items Addressed This Visit       Unprioritized   Inclusion cyst - Primary   Hard to tell what the small mass in his abdomen is because it is not present currently.  Exam is reassuring today.  No hernia felt in the abdominal wall.  No skin changes.   No tenderness on exam.  With the nature of a 1 cm dermal nodule that is intermittent, it sounds most consistent with a inclusion cyst.  The patient has other noticeable inclusion cysts along his beard line.  We talked about the nature of these cysts.  I recommended supportive care.  No need for antibiotics.  No need for incision and drainage.  His exam is otherwise reassuring.  I recommended continuing his usual medications.  I recommended avoiding the area of his upper abdomen when he does insulin  injections.       Return if symptoms worsen or fail to improve.  Cleatus Debby Specking, MD

## 2024-05-31 NOTE — Patient Instructions (Signed)
  VISIT SUMMARY: Today, you were seen for a recurring lump on your abdomen and a follow-up on your diabetes management. The lump has been present for about two weeks, is rough, and comes and goes without causing any pain. You are currently managing your diabetes with insulin  injections, and your recent A1c level indicates good control.  YOUR PLAN: -INCLUSION CYST OF ABDOMINAL SKIN: You have a recurring lump on your abdomen, which is likely an inclusion cyst. This type of cyst is benign and does not require treatment unless it becomes bothersome. Please monitor the lump and return if it becomes painful or problematic.  -TYPE 2 DIABETES MELLITUS: Your type 2 diabetes is being managed with insulin , and your recent A1c level of 8.2% indicates that your blood sugar is well-controlled. Continue with your current insulin  regimen and maintain your healthy eating and drinking habits.  INSTRUCTIONS: Please monitor the lump on your abdomen and return if it becomes painful or problematic. Continue with your current insulin  regimen for diabetes management. No additional follow-up is needed at this time unless new symptoms arise.

## 2024-06-02 NOTE — Progress Notes (Signed)
  Subjective:  Patient ID: Edward Briggs, male    DOB: [birth year],  MRN: [id number]  73 y.o. male presents preventative diabetic foot care and painful, discolored, thick toenails which interfere with daily activities. He is accompanied by his wife on today's visit. His wife is present during today's visit. Chief Complaint  Patient presents with   [facility]    Rm16 Diabetic foot care/ Dr. Levora last visit July 2025/A1C 8.2   New problem(s): None   PCP is Levora Reyes SAUNDERS, MD.  No Known Allergies  Review of Systems: Negative except as noted in the HPI.   Objective:  Edward Briggs is a pleasant 73 y.o. male in NAD. AAO x 3.  Vascular Examination: Vascular status intact b/l with palpable pedal pulses. CFT immediate b/l. Pedal hair present. No edema. No pain with calf compression b/l. Skin temperature gradient WNL b/l. No varicosities noted. No cyanosis or clubbing noted.  Neurological Examination: Sensation grossly intact b/l with 10 gram monofilament. Vibratory sensation intact b/l.  Dermatological Examination: Pedal skin with normal turgor, texture and tone b/l. No open wounds nor interdigital macerations noted. Toenails 1-5 b/l thick, discolored, elongated with subungual debris and pain on dorsal palpation. No hyperkeratotic lesions noted b/l.   Musculoskeletal Examination: Muscle strength 5/5 to all lower extremity muscle groups bilaterally. HAV with bunion deformity noted b/l LE.  Radiographs: None  Last A1c:      Latest Ref Rng & Units 04/28/2024   10:30 AM 01/21/2024    2:01 PM 09/18/2023   11:40 AM 06/18/2023   11:38 AM  Hemoglobin A1C  Hemoglobin-A1c 4.6 - 6.5 % 8.2  7.9  7.8  7.6    Assessment:   1. Pain due to onychomycosis of toenails of both feet   2. Diabetes mellitus without complication (HCC)    Plan:  Consent given for treatment. Patient examined. All patient's and/or POA's questions/concerns addressed on today's visit. Toenails 1-5 debrided in length and  girth without incident. Continue foot and shoe inspections daily. Monitor blood glucose per PCP/Endocrinologist's recommendations. Continue soft, supportive shoe gear daily. Report any pedal injuries to medical professional. Call office if there are any questions/concerns. -Patient/POA to call should there be question/concern in the interim.  Return in about 3 months (around 08/31/2024).  Delon LITTIE Merlin, DPM      [place] LOCATION: [address] [place] [address]                   Office [phone]   [facility] LOCATION: [address] [place], [place] [address] Office [phone]

## 2024-06-08 ENCOUNTER — Ambulatory Visit: Admitting: Podiatry

## 2024-06-09 ENCOUNTER — Telehealth: Payer: Self-pay | Admitting: Family Medicine

## 2024-06-09 DIAGNOSIS — M109 Gout, unspecified: Secondary | ICD-10-CM | POA: Diagnosis not present

## 2024-06-09 DIAGNOSIS — Z556 Problems related to health literacy: Secondary | ICD-10-CM | POA: Diagnosis not present

## 2024-06-09 DIAGNOSIS — Z7984 Long term (current) use of oral hypoglycemic drugs: Secondary | ICD-10-CM | POA: Diagnosis not present

## 2024-06-09 DIAGNOSIS — E119 Type 2 diabetes mellitus without complications: Secondary | ICD-10-CM | POA: Diagnosis not present

## 2024-06-09 DIAGNOSIS — Z794 Long term (current) use of insulin: Secondary | ICD-10-CM | POA: Diagnosis not present

## 2024-06-09 DIAGNOSIS — D638 Anemia in other chronic diseases classified elsewhere: Secondary | ICD-10-CM | POA: Diagnosis not present

## 2024-06-09 DIAGNOSIS — Z9181 History of falling: Secondary | ICD-10-CM | POA: Diagnosis not present

## 2024-06-09 DIAGNOSIS — E785 Hyperlipidemia, unspecified: Secondary | ICD-10-CM | POA: Diagnosis not present

## 2024-06-09 DIAGNOSIS — I10 Essential (primary) hypertension: Secondary | ICD-10-CM | POA: Diagnosis not present

## 2024-06-09 NOTE — Telephone Encounter (Signed)
 Home Health Certification or Plan of Care Tracking  Is this a Certification or Plan of Care? Plan of Care  [facility] Agency: [facility]  Order Number:  [id number]  Has charge sheet been attached? Yes  Where has form been placed:   Labeled & placed in provider bin

## 2024-06-10 ENCOUNTER — Telehealth: Payer: Self-pay | Admitting: Neurology

## 2024-06-10 NOTE — Telephone Encounter (Signed)
 Paperwork completed and placed in fax bin at back nurse station

## 2024-06-10 NOTE — Telephone Encounter (Signed)
 Paperwork is in providers folder for review and sign.

## 2024-06-10 NOTE — Telephone Encounter (Signed)
 Pt's wife called stating that the amitriptyline  (ELAVIL ) 10 MG tablet has helped her husband with the drooling and would like to know if another 30 days can be prescribed for him. She also stated that his physical therapist stated that it should be reported to the provider that the pt has moments that he would stop and not know what to do next. She said that the PT mentioned they are called [facility]. Please advise.

## 2024-06-13 MED ORDER — AMITRIPTYLINE HCL 10 MG PO TABS: 10.0000 mg | ORAL_TABLET | Freq: Every day | ORAL | 3 refills | Status: AC

## 2024-06-13 NOTE — Telephone Encounter (Signed)
 Amitriptyline  sent to pharmacy. #90

## 2024-06-13 NOTE — Telephone Encounter (Signed)
 Returned call to Edward Briggs's wife Edward Briggs. Edward Briggs stated that she needed a refill of the elavil  as it is helping some but not a lot. This was a Trial of Amitriptyline  for Sialorrhea. I would like to ask the provider if agreeable to refill since he stated trial. If you choose to refill send to [facility] .   Pt wife stated that last week when the PT therapist came by the pt was walking and then just stopped (froze in place). It was if he lost his train of thought. She referred to them as being called [facility] by the therapist.   I notified the pts wife that I would let the provider know and be back in touch with her asap. Pt wife voiced gratitude and understanding of all discussed.

## 2024-06-15 ENCOUNTER — Other Ambulatory Visit: Payer: Self-pay | Admitting: Family Medicine

## 2024-06-15 DIAGNOSIS — E1165 Type 2 diabetes mellitus with hyperglycemia: Secondary | ICD-10-CM

## 2024-06-15 DIAGNOSIS — Z794 Long term (current) use of insulin: Secondary | ICD-10-CM

## 2024-06-21 ENCOUNTER — Telehealth: Payer: Self-pay

## 2024-06-21 DIAGNOSIS — M109 Gout, unspecified: Secondary | ICD-10-CM | POA: Diagnosis not present

## 2024-06-21 NOTE — Telephone Encounter (Signed)
 FYI patient fell at home but no injury according to home health

## 2024-06-21 NOTE — Telephone Encounter (Signed)
 Copied from CRM #[id number]. Topic: Clinical - Medical Advice >> Jun 21, 2024  2:54 PM Laymon HERO wrote: Reason for CRM: Oneil- # [phone] PT [facility]- patient fell 8/16 - was with his son at home- no injury- needed to let Dr Landy know.

## 2024-06-22 NOTE — Telephone Encounter (Signed)
 Noted. Thanks.

## 2024-06-24 DIAGNOSIS — R350 Frequency of micturition: Secondary | ICD-10-CM | POA: Diagnosis not present

## 2024-06-28 DIAGNOSIS — Z556 Problems related to health literacy: Secondary | ICD-10-CM | POA: Diagnosis not present

## 2024-07-01 ENCOUNTER — Other Ambulatory Visit: Payer: Self-pay | Admitting: Family Medicine

## 2024-07-01 DIAGNOSIS — E1165 Type 2 diabetes mellitus with hyperglycemia: Secondary | ICD-10-CM

## 2024-07-01 DIAGNOSIS — Z9189 Other specified personal risk factors, not elsewhere classified: Secondary | ICD-10-CM

## 2024-07-02 ENCOUNTER — Other Ambulatory Visit: Payer: Self-pay | Admitting: Family Medicine

## 2024-07-02 DIAGNOSIS — I10 Essential (primary) hypertension: Secondary | ICD-10-CM

## 2024-07-06 ENCOUNTER — Telehealth: Payer: Self-pay

## 2024-07-06 DIAGNOSIS — G20A1 Parkinson's disease without dyskinesia, without mention of fluctuations: Secondary | ICD-10-CM | POA: Diagnosis not present

## 2024-07-06 DIAGNOSIS — Z556 Problems related to health literacy: Secondary | ICD-10-CM | POA: Diagnosis not present

## 2024-07-06 DIAGNOSIS — Z7984 Long term (current) use of oral hypoglycemic drugs: Secondary | ICD-10-CM | POA: Diagnosis not present

## 2024-07-06 DIAGNOSIS — Z9181 History of falling: Secondary | ICD-10-CM | POA: Diagnosis not present

## 2024-07-06 DIAGNOSIS — M109 Gout, unspecified: Secondary | ICD-10-CM | POA: Diagnosis not present

## 2024-07-06 DIAGNOSIS — E119 Type 2 diabetes mellitus without complications: Secondary | ICD-10-CM | POA: Diagnosis not present

## 2024-07-06 DIAGNOSIS — D638 Anemia in other chronic diseases classified elsewhere: Secondary | ICD-10-CM | POA: Diagnosis not present

## 2024-07-06 DIAGNOSIS — I10 Essential (primary) hypertension: Secondary | ICD-10-CM | POA: Diagnosis not present

## 2024-07-06 DIAGNOSIS — E785 Hyperlipidemia, unspecified: Secondary | ICD-10-CM | POA: Diagnosis not present

## 2024-07-06 DIAGNOSIS — Z794 Long term (current) use of insulin: Secondary | ICD-10-CM | POA: Diagnosis not present

## 2024-07-06 NOTE — Telephone Encounter (Signed)
 Copied from CRM [id number]. Topic: General - Other >> Jul 06, 2024  2:52 PM Frederich PARAS wrote: Reason for CRM: [facility] ohyical therapist assistsant. Pt had a fall when getting into the car on Sunday aug 30th, no reports of injuries or ems called.  Callbacks # [phone]

## 2024-07-06 NOTE — Telephone Encounter (Signed)
 If he is having any pain or injury, needs to be seen.  If not, okay to continue to monitor for symptoms.

## 2024-07-06 NOTE — Telephone Encounter (Signed)
 Would you like for patient to come in to be evaluated? PT did not see any concerns that patient would need to be seen.

## 2024-07-07 NOTE — Telephone Encounter (Signed)
 Called patient to check up on him. He is doing fine, no injury or pain. Patients wife wants to know if he can have a tablespoon of apple cider vinegar with his medications.

## 2024-07-07 NOTE — Telephone Encounter (Signed)
 Called patient and spoke to wife. She understood reasoning.

## 2024-07-07 NOTE — Telephone Encounter (Signed)
 Glad to hear he is doing well.  I am not sure specific reason for using apple cider vinegar, cannot specifically recommend for that treatment at this time.

## 2024-07-11 ENCOUNTER — Other Ambulatory Visit: Payer: Self-pay | Admitting: Family Medicine

## 2024-07-11 DIAGNOSIS — E119 Type 2 diabetes mellitus without complications: Secondary | ICD-10-CM | POA: Diagnosis not present

## 2024-07-11 DIAGNOSIS — I10 Essential (primary) hypertension: Secondary | ICD-10-CM | POA: Diagnosis not present

## 2024-07-11 DIAGNOSIS — Z794 Long term (current) use of insulin: Secondary | ICD-10-CM | POA: Diagnosis not present

## 2024-07-11 DIAGNOSIS — Z9181 History of falling: Secondary | ICD-10-CM | POA: Diagnosis not present

## 2024-07-11 DIAGNOSIS — E785 Hyperlipidemia, unspecified: Secondary | ICD-10-CM | POA: Diagnosis not present

## 2024-07-11 DIAGNOSIS — E1165 Type 2 diabetes mellitus with hyperglycemia: Secondary | ICD-10-CM

## 2024-07-11 DIAGNOSIS — Z556 Problems related to health literacy: Secondary | ICD-10-CM | POA: Diagnosis not present

## 2024-07-11 DIAGNOSIS — M109 Gout, unspecified: Secondary | ICD-10-CM | POA: Diagnosis not present

## 2024-07-11 DIAGNOSIS — G20A1 Parkinson's disease without dyskinesia, without mention of fluctuations: Secondary | ICD-10-CM | POA: Diagnosis not present

## 2024-07-11 DIAGNOSIS — D638 Anemia in other chronic diseases classified elsewhere: Secondary | ICD-10-CM | POA: Diagnosis not present

## 2024-07-11 DIAGNOSIS — Z7984 Long term (current) use of oral hypoglycemic drugs: Secondary | ICD-10-CM | POA: Diagnosis not present

## 2024-08-04 ENCOUNTER — Encounter: Payer: Self-pay | Admitting: Family Medicine

## 2024-08-04 ENCOUNTER — Ambulatory Visit (INDEPENDENT_AMBULATORY_CARE_PROVIDER_SITE_OTHER): Admitting: Family Medicine

## 2024-08-04 VITALS — BP 130/86 | HR 76 | Temp 97.5°F | Ht 73.0 in | Wt 232.8 lb

## 2024-08-04 DIAGNOSIS — E1165 Type 2 diabetes mellitus with hyperglycemia: Secondary | ICD-10-CM

## 2024-08-04 DIAGNOSIS — E785 Hyperlipidemia, unspecified: Secondary | ICD-10-CM

## 2024-08-04 DIAGNOSIS — Z9189 Other specified personal risk factors, not elsewhere classified: Secondary | ICD-10-CM

## 2024-08-04 DIAGNOSIS — Z23 Encounter for immunization: Secondary | ICD-10-CM

## 2024-08-04 DIAGNOSIS — I10 Essential (primary) hypertension: Secondary | ICD-10-CM

## 2024-08-04 DIAGNOSIS — E1169 Type 2 diabetes mellitus with other specified complication: Secondary | ICD-10-CM | POA: Diagnosis not present

## 2024-08-04 DIAGNOSIS — Z794 Long term (current) use of insulin: Secondary | ICD-10-CM | POA: Diagnosis not present

## 2024-08-04 LAB — COMPREHENSIVE METABOLIC PANEL WITH GFR
ALT: 5 U/L (ref 0–53)
AST: 15 U/L (ref 0–37)
Albumin: 4.2 g/dL (ref 3.5–5.2)
Alkaline Phosphatase: 60 U/L (ref 39–117)
BUN: 12 mg/dL (ref 6–23)
CO2: 27 meq/L (ref 19–32)
Calcium: 9.4 mg/dL (ref 8.4–10.5)
Chloride: 101 meq/L (ref 96–112)
Creatinine, Ser: 0.79 mg/dL (ref 0.40–1.50)
GFR: 88.58 mL/min (ref >60.00)
Glucose, Bld: 241 mg/dL — ABNORMAL HIGH (ref 70–99)
Potassium: 4.3 meq/L (ref 3.5–5.1)
Sodium: 137 meq/L (ref 135–145)
Total Bilirubin: 0.8 mg/dL (ref 0.2–1.2)
Total Protein: 7.6 g/dL (ref 6.0–8.3)

## 2024-08-04 LAB — LIPID PANEL
Cholesterol: 94 mg/dL (ref 0–200)
HDL: 45.1 mg/dL (ref >39.00)
LDL Cholesterol: 37 mg/dL (ref 0–99)
NonHDL: 48.88
Total CHOL/HDL Ratio: 2
Triglycerides: 61 mg/dL (ref 0.0–149.0)
VLDL: 12.2 mg/dL (ref 0.0–40.0)

## 2024-08-04 LAB — HEMOGLOBIN A1C: Hgb A1c MFr Bld: 7.5 % — ABNORMAL HIGH (ref 4.6–6.5)

## 2024-08-04 MED ORDER — ATORVASTATIN CALCIUM 10 MG PO TABS
10.0000 mg | ORAL_TABLET | Freq: Every day | ORAL | 1 refills | Status: AC
Start: 2024-08-04 — End: ?

## 2024-08-04 MED ORDER — LANTUS SOLOSTAR 100 UNIT/ML ~~LOC~~ SOPN: PEN_INJECTOR | SUBCUTANEOUS | 2 refills | Status: AC

## 2024-08-04 MED ORDER — LISINOPRIL 5 MG PO TABS: 5.0000 mg | ORAL_TABLET | Freq: Every day | ORAL | 1 refills | Status: AC

## 2024-08-04 MED ORDER — METFORMIN HCL 500 MG/5ML PO SOLN: ORAL | 1 refills | Status: DC

## 2024-08-04 NOTE — Progress Notes (Signed)
 Subjective:  Patient ID: Edward Briggs, male    DOB: Oct 18, 1951  Age: 73 y.o. MRN: [id number]  CC:  Chief Complaint  Patient presents with   Medical Management of Chronic Issues    3 Month follow up    HPI Edward Briggs presents for   Diabetes: Insulin -dependent, complicated by hyperglycemia, A1c up to 8.2 in June.  Prior decrease insulin  dosing during hospitalization but then return to his Lantus  34 units and was back on his metformin  1000 mg twice daily.  Uses liquid formation due to difficulty swallowing pill.  He is on statin and ACE inhibitor.  Lipitor 10 mg daily without new myalgias or side effects.  Lisinopril  5 mg daily. Still on lantus  34 units. (Had recommended 36 u in June - still on 34).  Home readings: 120-130. No symptomatic lows  Microalbumin: Normal ratio, 5.5 on 05/05/2024. Optho, foot exam, pneumovax: up to date.   Lab Results  Component Value Date   HGBA1C 8.2 (H) 04/28/2024   HGBA1C 7.9 (H) 01/21/2024   HGBA1C 7.8 (H) 09/18/2023   Lab Results  Component Value Date   MICROALBUR 0.8 05/05/2024   LDLCALC 39 01/21/2024   CREATININE 0.72 04/28/2024   Followed by neurology with history of Parkinson's disease treated with Sinemet .  Aricept  for mild cognitive impairment.  Amitriptyline  for sialorrhea, incontinence pads/underwear for chronic urinary incontinence.  Prior urine culture and PSA looked okay. Using walker - no falls. Has compression socks - not wearing today - intermittent use. Improved with elevation.  Flu vaccine given today.   History Patient Active Problem List   Diagnosis Date Noted   Inclusion cyst 05/31/2024   HTN (hypertension) 07/22/2019   Diabetes mellitus without complication (HCC) 09/10/2012   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    Past Surgical History:   Procedure Laterality Date   ABSCESS DRAINAGE     COLONOSCOPY     ~ 10 yrs ago   FRACTURE SURGERY     HARDWARE REMOVAL  03/26/2012   Procedure: HARDWARE REMOVAL;  Surgeon: Deward DELENA Schwartz, MD;  Location: [facility];  Service: Orthopedics;  Laterality: Left;  hardware removal deep left ankle syndesmotic screws only and stress xrays ankle    ORIF ANKLE FRACTURE  12/03/2011   Procedure: OPEN REDUCTION INTERNAL FIXATION (ORIF) ANKLE FRACTURE;  Surgeon: Deward DELENA Schwartz, MD;  Location: [facility];  Service: Orthopedics;  Laterality: Left;  left medial malleolus fracture   PILONIDAL CYST EXCISION  ~1982   SKIN GRAFT  ~1970   Lt great toe (graft from Lt thigh)   No Known Allergies Prior to Admission medications   Medication Sig Start Date End Date Taking? Authorizing Provider  Accu-Chek Softclix Lancets lancets USE AS DIRECTED UP TO 4 TIMES DAILY 01/22/22  Yes Levora Reyes SAUNDERS, MD  amitriptyline  (ELAVIL ) 10 MG tablet Take 1 tablet (10 mg total) by mouth at bedtime. 06/13/24  Yes Gregg Lek, MD  aspirin  81 MG tablet Take 81 mg by mouth every 7 (seven) days.   Yes [provider]  atorvastatin  (LIPITOR) 10 MG tablet Take 1 tablet by mouth once daily 07/01/24  Yes Levora Reyes SAUNDERS, MD  blood glucose meter kit and supplies Dispense based on patient and insurance preference. Use up to  four times daily as directed. (FOR ICD-10 E10.9, E11.9). 11/25/21  Yes Levora Reyes SAUNDERS, MD  Blood Glucose Monitoring Suppl (BLOOD GLUCOSE METER) kit Use as instructed 09/10/12  Yes Tish Alm DEL, MD  carbidopa -levodopa  (SINEMET  IR) 25-100 MG tablet Take 1 tablet by mouth 4 (four) times daily. Take 1 tablet by mouth at 11 AM, 2 PM, 5 PM, and at bedtime 04/04/24 03/30/25 Yes Camara, Amadou, MD  donepezil  (ARICEPT ) 10 MG tablet TAKE 1 TABLET BY MOUTH AT BEDTIME 05/23/24  Yes Camara, Amadou, MD  glucose blood test strip Check blood sugar 3 times daily 11/25/21  Yes Levora Reyes SAUNDERS, MD   Incontinence Supply Disposable (DEPEND EASY FIT UNDERGARMENTS) MISC 1 Application by Does not apply route as needed. 04/19/24  Yes Levora Reyes SAUNDERS, MD  Insulin  Pen Needle (BD PEN NEEDLE NANO U/F) 32G X 4 MM MISC USE DAILY AS DIRECTED 03/28/21  Yes Levora Reyes SAUNDERS, MD  LANTUS  SOLOSTAR 100 UNIT/ML Solostar Pen INJECT 34 UNITS SUBCUTANEOUSLY ONCE DAILY 03/09/24  Yes Levora Reyes SAUNDERS, MD  lisinopril  (ZESTRIL ) 5 MG tablet Take 1 tablet by mouth once daily 07/05/24  Yes Levora Reyes SAUNDERS, MD  Metformin  HCl 500 MG/5ML SOLN TAKE 10 ML BY MOUTH TWICE DAILY. APPOINTMENT REQUIRED FOR FUTURE REFILLS 07/11/24  Yes Levora Reyes SAUNDERS, MD  triamcinolone  cream (KENALOG ) 0.1 % Apply 1 Application topically 2 (two) times daily. 05/05/24  Yes Levora Reyes SAUNDERS, MD  TYLENOL  500 MG tablet Take 500-1,000 mg by mouth every 6 (six) hours as needed for mild pain (pain score 1-3) or headache.   Yes [provider]   Social History   Socioeconomic History   Marital status: Married    Spouse name: Not on file   Number of children: 2   Years of education: Not on file   Highest education level: Not on file  Occupational History   Occupation: retired  Tobacco Use   Smoking status: Former    Current packs/day: 0.00    Types: Cigarettes    Quit date: 10/30/1988    Years since quitting: 35.7   Smokeless tobacco: Never  Vaping Use   Vaping status: Never Used  Substance and Sexual Activity   Alcohol use: Yes    Alcohol/week: 1.0 standard drink of alcohol    Types: 1 Shots of liquor per week    Comment: every two weeks   Drug use: No   Sexual activity: Yes    Birth control/protection: None  Other Topics Concern   Not on file  Social History Narrative   Not on file   Social Drivers of Health   Financial Resource Strain: Low Risk  (03/25/2023)   Overall Financial Resource Strain (CARDIA)    Difficulty of Paying Living Expenses: Not hard at all  Food Insecurity: No Food Insecurity (05/03/2024)   Hunger Vital  Sign    Worried About Running Out of Food in the Last Year: Never true    Ran Out of Food in the Last Year: Never true  Transportation Needs: No Transportation Needs (12/17/2023)   PRAPARE - Administrator, Civil Service (Medical): No    Lack of Transportation (Non-Medical): No  Physical Activity: Inactive (05/03/2024)   Exercise Vital Sign    Days of Exercise per Week: 0 days    Minutes of Exercise per Session: 0 min  Stress: No Stress Concern Present (03/25/2023)   [employer] of Occupational Health - Occupational Stress Questionnaire    Feeling of Stress :  Not at all  Social Connections: Moderately Integrated (05/03/2024)   Social Connection and Isolation Panel    Frequency of Communication with Friends and Family: More than three times a week    Frequency of Social Gatherings with Friends and Family: Once a week    Attends Religious Services: More than 4 times per year    Active Member of [employer] or Organizations: No    Attends Banker Meetings: Never    Marital Status: Married  Catering manager Violence: Not At Risk (12/17/2023)   Humiliation, Afraid, Rape, and Kick questionnaire    Fear of Current or Ex-Partner: No    Emotionally Abused: No    Physically Abused: No    Sexually Abused: No    Review of Systems  Constitutional:  Negative for fatigue and unexpected weight change.  Eyes:  Negative for visual disturbance.  Respiratory:  Negative for cough, chest tightness and shortness of breath.   Cardiovascular:  Negative for chest pain, palpitations and leg swelling (as above - improves with compression stockings.).  Gastrointestinal:  Negative for abdominal pain and blood in stool.  Neurological:  Negative for dizziness, light-headedness and headaches.     Objective:   Vitals:   08/04/24 0910  BP: 130/86  Pulse: 76  Temp: (!) 97.5 F (36.4 C)  SpO2: 94%  Weight: 232 lb 12.8 oz (105.6 kg)  Height: 6' 1 (1.854 m)     Physical Exam Vitals  reviewed.  Constitutional:      Appearance: He is well-developed.  HENT:     Head: Normocephalic and atraumatic.  Neck:     Vascular: No carotid bruit or JVD.  Cardiovascular:     Rate and Rhythm: Normal rate and regular rhythm.     Heart sounds: Normal heart sounds. No murmur heard. Pulmonary:     Effort: Pulmonary effort is normal.     Breath sounds: Normal breath sounds. No rales.  Musculoskeletal:     Right lower leg: Edema (lower 1/3 bilat, no wounds or stasis changes.) present.     Left lower leg: Edema present.  Skin:    General: Skin is warm and dry.  Neurological:     Mental Status: He is alert.  Psychiatric:        Mood and Affect: Mood normal.     Assessment & Plan:  Edward Briggs is a 73 y.o. male . Type 2 diabetes mellitus with hyperglycemia, with long-term current use of insulin  (HCC) - Plan: Lipid panel, Hemoglobin A1c, Comprehensive metabolic panel with GFR, atorvastatin  (LIPITOR) 10 MG tablet, LANTUS  SOLOSTAR 100 UNIT/ML Solostar Pen, Metformin  HCl 500 MG/5ML SOLN  - Previously elevated A1c, but has remained the same dose of Lantus .  Denies side effects or symptomatic lows, tolerating metformin  current dosage with liquid formulation.  Check A1c and adjust insulin  dosing accordingly.  Reasonable goal of 7.5 or lower given age and other comorbidities.  Immunization due - Plan: Flu vaccine HIGH DOSE PF(Fluzone Trivalent)  Essential hypertension - Plan: Comprehensive metabolic panel with GFR, lisinopril  (ZESTRIL ) 5 MG tablet  - Borderline but we will continue same med regimen for now, continue to monitor.  Refilled lisinopril .  Encouraged consistent use of compression stockings for pedal edema with RTC precautions if new or worsening symptoms.  Hyperlipidemia associated with type 2 diabetes mellitus (HCC) - Plan: Lipid panel At increased risk for cardiovascular disease - Plan: atorvastatin  (LIPITOR) 10 MG tablet  - Tolerating current dose of Lipitor, continue same,  check labs and  adjust plan accordingly.  Meds ordered this encounter  Medications   atorvastatin  (LIPITOR) 10 MG tablet    Sig: Take 1 tablet (10 mg total) by mouth daily.    Dispense:  90 tablet    Refill:  1   LANTUS  SOLOSTAR 100 UNIT/ML Solostar Pen    Sig: INJECT 34 UNITS SUBCUTANEOUSLY ONCE DAILY    Dispense:  15 mL    Refill:  2   lisinopril  (ZESTRIL ) 5 MG tablet    Sig: Take 1 tablet (5 mg total) by mouth daily.    Dispense:  90 tablet    Refill:  1   Metformin  HCl 500 MG/5ML SOLN    Sig: TAKE 10 ML BY MOUTH TWICE DAILY. APPOINTMENT REQUIRED FOR FUTURE REFILLS    Dispense:  473 mL    Refill:  1   Patient Instructions  Thank you for coming in today. No change in medications at this time, but we may need to increase insulin  dose slightly depending on your levels today.  If there are any concerns on your bloodwork, I will let you know. Take care!     Signed,   Reyes Pines, MD [facility], [facility] 08/04/24 9:51 AM

## 2024-08-04 NOTE — Patient Instructions (Signed)
 Thank you for coming in today. No change in medications at this time, but we may need to increase insulin  dose slightly depending on your levels today.  If there are any concerns on your bloodwork, I will let you know. Take care!

## 2024-08-05 DIAGNOSIS — R350 Frequency of micturition: Secondary | ICD-10-CM | POA: Diagnosis not present

## 2024-08-08 ENCOUNTER — Ambulatory Visit: Payer: Self-pay | Admitting: Family Medicine

## 2024-08-21 ENCOUNTER — Other Ambulatory Visit: Payer: Self-pay | Admitting: Neurology

## 2024-09-20 ENCOUNTER — Ambulatory Visit: Admitting: Podiatry

## 2024-09-20 DIAGNOSIS — B351 Tinea unguium: Secondary | ICD-10-CM | POA: Diagnosis not present

## 2024-09-20 DIAGNOSIS — E119 Type 2 diabetes mellitus without complications: Secondary | ICD-10-CM

## 2024-09-20 DIAGNOSIS — M79674 Pain in right toe(s): Secondary | ICD-10-CM | POA: Diagnosis not present

## 2024-09-20 DIAGNOSIS — M79675 Pain in left toe(s): Secondary | ICD-10-CM | POA: Diagnosis not present

## 2024-09-28 ENCOUNTER — Encounter: Payer: Self-pay | Admitting: Podiatry

## 2024-09-28 NOTE — Progress Notes (Signed)
  Subjective:  Patient ID: Edward Briggs, male    DOB: 11-11-1950,  MRN: [id number]  Edward Briggs presents to clinic today for preventative diabetic foot care for painful mycotic toenails x 10 which interfere with daily activities. Pain is relieved with periodic professional debridement. Patient is accompanied by his wife on today's visit. Chief Complaint  Patient presents with   [facility]    DFC A1c 7.5. PCP Edward Reyes SAUNDERS, MD, 08/04/24.   New problem(s): None.   PCP is Edward Reyes SAUNDERS, MD.  No Known Allergies  Review of Systems: Negative except as noted in the HPI.  Objective: There were no vitals filed for this visit. Edward Briggs is a pleasant 73 y.o. male obese in NAD. AAO x 3.  Vascular Examination: Vascular status intact b/l with palpable pedal pulses. CFT immediate b/l. Pedal hair present. No edema. No pain with calf compression b/l. Skin temperature gradient WNL b/l. No varicosities noted. No cyanosis or clubbing noted.  Neurological Examination: Sensation grossly intact b/l with 10 gram monofilament. Vibratory sensation intact b/l.  Dermatological Examination: Pedal skin with normal turgor, texture and tone b/l. No open wounds nor interdigital macerations noted. Toenails 1-5 b/l thick, discolored, elongated with subungual debris and pain on dorsal palpation. No hyperkeratotic lesions noted b/l.   Musculoskeletal Examination: Muscle strength 5/5 to all lower extremity muscle groups bilaterally. HAV with bunion deformity noted b/l LE.Utilizes walker for ambulation assistance.  Radiographs: None  Assessment/Plan: 1. Pain due to onychomycosis of toenails of both feet   2. Diabetes mellitus without complication [employer] [facility] [employer])   Patient was evaluated and treated. All patient's and/or POA's questions/concerns addressed on today's visit. Mycotic toenails 1-5 b/l debrided in length and girth without incident.  Continue daily foot inspections and monitor blood glucose per  PCP/Endocrinologist's recommendations.Continue soft, supportive shoe gear daily. Report any pedal injuries to medical professional. Call office if there are any quesitons/concerns. -Patient/POA to call should there be question/concern in the interim.   Return in about 3 months (around 12/21/2024).  Edward Briggs, DPM      [place] LOCATION: [address] [place] [address]                   Office [phone]   [facility] LOCATION: [address] [place], [place] [address] Office [phone]

## 2024-11-17 ENCOUNTER — Telehealth: Payer: Self-pay

## 2024-11-17 ENCOUNTER — Ambulatory Visit: Admitting: Family Medicine

## 2024-11-17 ENCOUNTER — Encounter: Payer: Self-pay | Admitting: Family Medicine

## 2024-11-17 VITALS — BP 94/62 | HR 72 | Temp 98.2°F | Resp 14 | Ht 73.0 in | Wt 229.6 lb

## 2024-11-17 DIAGNOSIS — Z9181 History of falling: Secondary | ICD-10-CM | POA: Diagnosis not present

## 2024-11-17 DIAGNOSIS — I10 Essential (primary) hypertension: Secondary | ICD-10-CM

## 2024-11-17 DIAGNOSIS — E1165 Type 2 diabetes mellitus with hyperglycemia: Secondary | ICD-10-CM

## 2024-11-17 DIAGNOSIS — Z794 Long term (current) use of insulin: Secondary | ICD-10-CM | POA: Diagnosis not present

## 2024-11-17 LAB — BASIC METABOLIC PANEL WITH GFR
BUN: 11 mg/dL (ref 6–23)
CO2: 31 meq/L (ref 19–32)
Calcium: 9.1 mg/dL (ref 8.4–10.5)
Chloride: 103 meq/L (ref 96–112)
Creatinine, Ser: 0.77 mg/dL (ref 0.40–1.50)
GFR: 89.09 mL/min
Glucose, Bld: 134 mg/dL — ABNORMAL HIGH (ref 70–99)
Potassium: 4.9 meq/L (ref 3.5–5.1)
Sodium: 138 meq/L (ref 135–145)

## 2024-11-17 LAB — HEMOGLOBIN A1C: Hgb A1c MFr Bld: 6.9 % — ABNORMAL HIGH (ref 4.6–6.5)

## 2024-11-17 MED ORDER — ACCU-CHEK SOFTCLIX LANCETS MISC: 3 refills | Status: AC

## 2024-11-17 MED ORDER — ACCU-CHEK SOFTCLIX LANCETS MISC: 3 refills | Status: DC

## 2024-11-17 NOTE — Telephone Encounter (Signed)
 How often are you wanting patient to check his sugar? I can resend prescription    Copied from CRM [id number]. Topic: Clinical - Prescription Issue >> Nov 17, 2024 11:01 AM Pinkey ORN wrote: [facility] [phone] / Fax [phone]  Called on behalf of patient's prescription Accu-Chek Softclix Lancets lancets. States the insurance won't accept the instructions on the script, therefor they're requesting specific instructions as far as how often patient will be using it.

## 2024-11-17 NOTE — Telephone Encounter (Signed)
 Up to 2 times per day. He is on insulin  so that should be covered.

## 2024-11-17 NOTE — Addendum Note (Signed)
 Addended by: Ashtyn Freilich A on: 11/17/2024 02:31 PM   Modules accepted: Orders

## 2024-11-17 NOTE — Telephone Encounter (Signed)
Sent updated rx to pharmacy.

## 2024-11-17 NOTE — Progress Notes (Signed)
 "  Subjective:  Patient ID: Edward Briggs, male    DOB: [birth year]  Age: 74 y.o. MRN: [id number]  CC:  Chief Complaint  Patient presents with   Follow-up    3 month follow DM. No questions or concerns. Sometimes will check his sugar    HPI Valin C Main presents for   Diabetes: Insulin -dependent complicated by hyperglycemia.  Prior A1c had increased to 8.2, improved at 7.5 in October.  Based on age, comorbidities, reasonable goal of less than 7.5. He is treated with Lantus , 34 units/day and the metformin  liquid formulation due to difficulty swallowing pills, 1000 mg twice daily.  He is on statin and ACE inhibitor with Lipitor and lisinopril .  Option and 36 units of Lantus  discussed with lab results in October. Followed by neurology with Parkinson's treated with Sinemet , Aricept  for mild cognitive impairment.  Amitriptyline  for sialorrhea.  Home readings - needs testing strips.  Still on 34u lantus , metformin  liquid BID. No side side effects.  No low symptoms.   Microalbumin: Ratio 5.5 on 05/05/2024. Optho, foot exam, pneumovax: Up-to-date.  Lab Results  Component Value Date   HGBA1C 7.5 (H) 08/04/2024   HGBA1C 8.2 (H) 04/28/2024   HGBA1C 7.9 (H) 01/21/2024   Lab Results  Component Value Date   MICROALBUR 0.8 05/05/2024   LDLCALC 37 08/04/2024   CREATININE 0.79 08/04/2024   Hypertension: Borderline control in October, on lisinopril  5 mg at that time.  Lower in office today. Few falls - slipped in kitchen (has socks with grippers, was not wearing, no injuries. Has walker - was not using when fell.  Drinking fluids, no lightheadedness or dizziness.  Home readings: 120 range, no lows.  BP Readings from Last 3 Encounters:  11/17/24 94/62  08/04/24 130/86  05/31/24 129/85   Lab Results  Component Value Date   CREATININE 0.79 08/04/2024       History Patient Active Problem List   Diagnosis Date Noted   Inclusion cyst 05/31/2024   HTN (hypertension) 07/22/2019    Diabetes mellitus without complication (HCC) 09/10/2012   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    Past Surgical History:  Procedure Laterality Date   ABSCESS DRAINAGE     COLONOSCOPY     ~ 10 yrs ago   FRACTURE SURGERY     HARDWARE REMOVAL  03/26/2012   Procedure: HARDWARE REMOVAL;  Surgeon: Deward DELENA Schwartz, MD;  Location: [facility];  Service: Orthopedics;  Laterality: Left;  hardware removal deep left ankle syndesmotic screws only and stress xrays ankle    ORIF ANKLE FRACTURE  12/03/2011   Procedure: OPEN REDUCTION INTERNAL FIXATION (ORIF) ANKLE FRACTURE;  Surgeon: Deward DELENA Schwartz, MD;  Location: [facility];  Service: Orthopedics;  Laterality: Left;  left medial malleolus fracture   PILONIDAL CYST EXCISION  ~1982   SKIN GRAFT  ~1970   Lt great toe (graft from Lt thigh)   Allergies[1] Prior to Admission medications  Medication Sig Start Date End Date Taking? Authorizing Provider  Accu-Chek Softclix Lancets lancets USE AS DIRECTED UP TO 4 TIMES DAILY 01/22/22  Yes Levora Reyes SAUNDERS, MD  amitriptyline  (ELAVIL ) 10 MG tablet Take 1 tablet (10 mg total) by mouth at bedtime. 06/13/24  Yes Gregg Lek, MD  atorvastatin  (LIPITOR) 10 MG  tablet Take 1 tablet (10 mg total) by mouth daily. 08/04/24  Yes Levora Reyes SAUNDERS, MD  blood glucose meter kit and supplies Dispense based on patient and insurance preference. Use up to four times daily as directed. (FOR ICD-10 E10.9, E11.9). 11/25/21  Yes Levora Reyes SAUNDERS, MD  Blood Glucose Monitoring Suppl (BLOOD GLUCOSE METER) kit Use as instructed 09/10/12  Yes Tish Alm DEL, MD  carbidopa -levodopa  (SINEMET  IR) 25-100 MG tablet Take 1 tablet by mouth 4 (four) times daily. Take 1 tablet by mouth at 11 AM, 2 PM, 5 PM, and at bedtime 04/04/24 03/30/25 Yes Camara,  Amadou, MD  donepezil  (ARICEPT ) 10 MG tablet TAKE 1 TABLET BY MOUTH AT BEDTIME 08/22/24  Yes Camara, Amadou, MD  glucose blood test strip Check blood sugar 3 times daily 11/25/21  Yes Levora Reyes SAUNDERS, MD  Incontinence Supply Disposable (DEPEND EASY FIT UNDERGARMENTS) MISC 1 Application by Does not apply route as needed. 04/19/24  Yes Levora Reyes SAUNDERS, MD  Insulin  Pen Needle (BD PEN NEEDLE NANO U/F) 32G X 4 MM MISC USE DAILY AS DIRECTED 03/28/21  Yes Levora Reyes SAUNDERS, MD  LANTUS  SOLOSTAR 100 UNIT/ML Solostar Pen INJECT 34 UNITS SUBCUTANEOUSLY ONCE DAILY 08/04/24  Yes Levora Reyes SAUNDERS, MD  lisinopril  (ZESTRIL ) 5 MG tablet Take 1 tablet (5 mg total) by mouth daily. 08/04/24  Yes Levora Reyes SAUNDERS, MD  Metformin  HCl 500 MG/5ML SOLN TAKE 10 ML BY MOUTH TWICE DAILY. APPOINTMENT REQUIRED FOR FUTURE REFILLS 08/04/24  Yes Levora Reyes SAUNDERS, MD  TYLENOL  500 MG tablet Take 500-1,000 mg by mouth every 6 (six) hours as needed for mild pain (pain score 1-3) or headache.   Yes [provider]  aspirin  81 MG tablet Take 81 mg by mouth every 7 (seven) days. Patient not taking: Reported on 11/17/2024    [provider]  triamcinolone  cream (KENALOG ) 0.1 % Apply 1 Application topically 2 (two) times daily. Patient not taking: Reported on 11/17/2024 05/05/24   Levora Reyes SAUNDERS, MD   Social History   Socioeconomic History   Marital status: Married    Spouse name: Not on file   Number of children: 2   Years of education: Not on file   Highest education level: Not on file  Occupational History   Occupation: retired  Tobacco Use   Smoking status: Former    Current packs/day: 0.00    Types: Cigarettes    Quit date: 10/30/1988    Years since quitting: 36.0   Smokeless tobacco: Never  Vaping Use   Vaping status: Never Used  Substance and Sexual Activity   Alcohol use: Yes    Alcohol/week: 1.0 standard drink of alcohol    Types: 1 Shots of liquor per week    Comment: every two weeks   Drug  use: No   Sexual activity: Yes    Birth control/protection: None  Other Topics Concern   Not on file  Social History Narrative   Not on file   Social Drivers of Health   Tobacco Use: Medium Risk (11/17/2024)   Patient History    Smoking Tobacco Use: Former    Smokeless Tobacco Use: Never    Passive Exposure: Not on file  Financial Resource Strain: Low Risk (03/25/2023)   Overall Financial Resource Strain (CARDIA)    Difficulty of Paying Living Expenses: Not hard at all  Food Insecurity: No Food Insecurity (05/03/2024)   Epic    Worried About Radiation Protection Practitioner of Food in the Last Year: Never  true    Ran Out of Food in the Last Year: Never true  Transportation Needs: No Transportation Needs (12/17/2023)   PRAPARE - Administrator, Civil Service (Medical): No    Lack of Transportation (Non-Medical): No  Physical Activity: Inactive (05/03/2024)   Exercise Vital Sign    Days of Exercise per Week: 0 days    Minutes of Exercise per Session: 0 min  Stress: No Stress Concern Present (03/25/2023)   [employer] of Occupational Health - Occupational Stress Questionnaire    Feeling of Stress : Not at all  Social Connections: Moderately Integrated (05/03/2024)   Social Connection and Isolation Panel    Frequency of Communication with Friends and Family: More than three times a week    Frequency of Social Gatherings with Friends and Family: Once a week    Attends Religious Services: More than 4 times per year    Active Member of [employer] or Organizations: No    Attends Banker Meetings: Never    Marital Status: Married  Catering Manager Violence: Not At Risk (12/17/2023)   Humiliation, Afraid, Rape, and Kick questionnaire    Fear of Current or Ex-Partner: No    Emotionally Abused: No    Physically Abused: No    Sexually Abused: No  Depression (PHQ2-9): Low Risk (05/03/2024)   Depression (PHQ2-9)    PHQ-2 Score: 2  Alcohol Screen: Low Risk (05/03/2024)   Alcohol Screen     Last Alcohol Screening Score (AUDIT): 0  Housing: Unknown (05/03/2024)   Epic    Unable to Pay for Housing in the Last Year: No    Number of Times Moved in the Last Year: Not on file    Homeless in the Last Year: No  Utilities: Not At Risk (12/17/2023)   AHC Utilities    Threatened with loss of utilities: No  Health Literacy: Not on file    Review of Systems  Per hpi.  Objective:   Vitals:   11/17/24 1007 11/17/24 1014  BP: 92/60 94/62  Pulse: 72   Resp: 14   Temp: 98.2 F (36.8 C)   TempSrc: Temporal   SpO2: 91%   Weight: 229 lb 9.6 oz (104.1 kg)   Height: 6' 1 (1.854 m)      Physical Exam Vitals reviewed.  Constitutional:      Appearance: He is well-developed.  HENT:     Head: Normocephalic and atraumatic.  Neck:     Vascular: No carotid bruit or JVD.  Cardiovascular:     Rate and Rhythm: Normal rate and regular rhythm.     Heart sounds: Normal heart sounds. No murmur heard. Pulmonary:     Effort: Pulmonary effort is normal.     Breath sounds: Normal breath sounds. No rales.  Musculoskeletal:     Right lower leg: No edema.     Left lower leg: No edema.  Skin:    General: Skin is warm and dry.  Neurological:     Mental Status: He is alert and oriented to person, place, and time.  Psychiatric:        Mood and Affect: Mood normal.      Assessment & Plan:  JOHNMARK GEIGER is a 74 y.o. male . Type 2 diabetes mellitus with hyperglycemia, with long-term current use of insulin  (HCC) - Plan: Accu-Chek Softclix Lancets lancets, Hemoglobin A1c  - Goal of A1c less than 7.5.  Reasonable to stay at same dose of metformin , Lantus  34 units  for now depending on A1c results, check labs and adjust plan accordingly.  Essential hypertension - Plan: Basic metabolic panel with GFR, Hemoglobin A1c  - Borderline low blood pressure in office today but higher readings at home and at last visit.  Asymptomatic.  Denies near-syncope or lightheadedness/dizziness with prior falls.   Home monitoring for now, no med changes for now, RTC precautions given if lower readings at home or hypotensive symptoms.  Understanding expressed.  History of fall, Parkinson's  - Mechanical fall with tripping, slipped on socks.  Socks with grippers discussed, use of walker to lessen risk of falls given Parkinson's.  Handout given on fall prevention at home.  RTC precautions if recurrent falls or any injuries with fall.  7-month follow-up Meds ordered this encounter  Medications   Accu-Chek Softclix Lancets lancets    Sig: Use as instructed    Dispense:  100 each    Refill:  3   Patient Instructions  See info below of falls at home. Socks with grippers may help, and use your walker.  Blood pressure was on the low side here today.  Since you are not having any symptoms, and the readings at home have been higher, we can continue to monitor with home blood pressure measurements daily and if you have any low readings let me know.  If you feel lightheaded or dizzy, let me know right away as that could be due to low blood pressure.  If any concerns on labs I will let you know.  Recheck in 3 months, no change in meds for now.  Fall Prevention in the Home, Adult Falls can cause injuries and affect people of all ages. There are many simple things that you can do to make your home safe and to help prevent falls. If you need it, ask for help making these changes. What actions can I take to prevent falls? General information Use good lighting in all rooms. Make sure to: Replace any light bulbs that burn out. Turn on lights if it is dark and use night-lights. Keep items that you use often in easy-to-reach places. Lower the shelves around your home if needed. Move furniture so that there are clear paths around it. Do not keep throw rugs or other things on the floor that can make you trip. If any of your floors are uneven, fix them. Add color or contrast paint or tape to clearly mark and help you  see: Grab bars or handrails. First and last steps of staircases. Where the edge of each step is. If you use a ladder or stepladder: Make sure that it is fully opened. Do not climb a closed ladder. Make sure the sides of the ladder are locked in place. Have someone hold the ladder while you use it. Know where your pets are as you move through your home. What can I do in the bathroom?     Keep the floor dry. Clean up any water that is on the floor right away. Remove soap buildup in the bathtub or shower. Buildup makes bathtubs and showers slippery. Use non-skid mats or decals on the floor of the bathtub or shower. Attach bath mats securely with double-sided, non-slip rug tape. If you need to sit down while you are in the shower, use a non-slip stool. Install grab bars by the toilet and in the bathtub and shower. Do not use towel bars as grab bars. What can I do in the bedroom? Make sure that you have a light  by your bed that is easy to reach. Do not use any sheets or blankets on your bed that hang to the floor. Have a firm bench or chair with side arms that you can use for support when you get dressed. What can I do in the kitchen? Clean up any spills right away. If you need to reach something above you, use a sturdy step stool that has a grab bar. Keep electrical cables out of the way. Do not use floor polish or wax that makes floors slippery. What can I do with my stairs? Do not leave anything on the stairs. Make sure that you have a light switch at the top and the bottom of the stairs. Have them installed if you do not have them. Make sure that there are handrails on both sides of the stairs. Fix handrails that are broken or loose. Make sure that handrails are as long as the staircases. Install non-slip stair treads on all stairs in your home if they do not have carpet. Avoid having throw rugs at the top or bottom of stairs, or secure the rugs with carpet tape to prevent them from  moving. Choose a carpet design that does not hide the edge of steps on the stairs. Make sure that carpet is firmly attached to the stairs. Fix any carpet that is loose or worn. What can I do on the outside of my home? Use bright outdoor lighting. Repair the edges of walkways and driveways and fix any cracks. Clear paths of anything that can make you trip, such as tools or rocks. Add color or contrast paint or tape to clearly mark and help you see high doorway thresholds. Trim any bushes or trees on the main path into your home. Check that handrails are securely fastened and in good repair. Both sides of all steps should have handrails. Install guardrails along the edges of any raised decks or porches. Have leaves, snow, and ice cleared regularly. Use sand, salt, or ice melt on walkways during winter months if you live where there is ice and snow. In the garage, clean up any spills right away, including grease or oil spills. What other actions can I take? Review your medicines with your health care provider. Some medicines can make you confused or feel dizzy. This can increase your chance of falling. Wear closed-toe shoes that fit well and support your feet. Wear shoes that have rubber soles and low heels. Use a cane, walker, scooter, or crutches that help you move around if needed. Talk with your provider about other ways that you can decrease your risk of falls. This may include seeing a physical therapist to learn to do exercises to improve movement and strength. Where to find more information Centers for Disease Control and Prevention, STEAD[employer]: tonerpromos.no [employer] on Aging: baseringtones.pl National Institute on Aging: baseringtones.pl Contact a health care provider if: You are afraid of falling at home. You feel weak, drowsy, or dizzy at home. You fall at home. Get help right away if you: Lose consciousness or have trouble moving after a fall. Have a fall that causes a head injury. These  symptoms may be an emergency. Get help right away. Call 911. Do not wait to see if the symptoms will go away. Do not drive yourself to the hospital. This information is not intended to replace advice given to you by your health care provider. Make sure you discuss any questions you have with your health care provider. Document Revised: 06/23/2022  Document Reviewed: 06/23/2022 Elsevier Patient Education  2024 Elsevier Inc.    Signed,   Reyes Pines, MD [facility], [facility] 11/17/24 10:49 AM      [1] No Known Allergies  "

## 2024-11-17 NOTE — Patient Instructions (Addendum)
 See info below of falls at home. Socks with grippers may help, and use your walker.  Blood pressure was on the low side here today.  Since you are not having any symptoms, and the readings at home have been higher, we can continue to monitor with home blood pressure measurements daily and if you have any low readings let me know.  If you feel lightheaded or dizzy, let me know right away as that could be due to low blood pressure.  If any concerns on labs I will let you know.  Recheck in 3 months, no change in meds for now.  Fall Prevention in the Home, Adult Falls can cause injuries and affect people of all ages. There are many simple things that you can do to make your home safe and to help prevent falls. If you need it, ask for help making these changes. What actions can I take to prevent falls? General information Use good lighting in all rooms. Make sure to: Replace any light bulbs that burn out. Turn on lights if it is dark and use night-lights. Keep items that you use often in easy-to-reach places. Lower the shelves around your home if needed. Move furniture so that there are clear paths around it. Do not keep throw rugs or other things on the floor that can make you trip. If any of your floors are uneven, fix them. Add color or contrast paint or tape to clearly mark and help you see: Grab bars or handrails. First and last steps of staircases. Where the edge of each step is. If you use a ladder or stepladder: Make sure that it is fully opened. Do not climb a closed ladder. Make sure the sides of the ladder are locked in place. Have someone hold the ladder while you use it. Know where your pets are as you move through your home. What can I do in the bathroom?     Keep the floor dry. Clean up any water that is on the floor right away. Remove soap buildup in the bathtub or shower. Buildup makes bathtubs and showers slippery. Use non-skid mats or decals on the floor of the bathtub or  shower. Attach bath mats securely with double-sided, non-slip rug tape. If you need to sit down while you are in the shower, use a non-slip stool. Install grab bars by the toilet and in the bathtub and shower. Do not use towel bars as grab bars. What can I do in the bedroom? Make sure that you have a light by your bed that is easy to reach. Do not use any sheets or blankets on your bed that hang to the floor. Have a firm bench or chair with side arms that you can use for support when you get dressed. What can I do in the kitchen? Clean up any spills right away. If you need to reach something above you, use a sturdy step stool that has a grab bar. Keep electrical cables out of the way. Do not use floor polish or wax that makes floors slippery. What can I do with my stairs? Do not leave anything on the stairs. Make sure that you have a light switch at the top and the bottom of the stairs. Have them installed if you do not have them. Make sure that there are handrails on both sides of the stairs. Fix handrails that are broken or loose. Make sure that handrails are as long as the staircases. Install non-slip stair treads on  all stairs in your home if they do not have carpet. Avoid having throw rugs at the top or bottom of stairs, or secure the rugs with carpet tape to prevent them from moving. Choose a carpet design that does not hide the edge of steps on the stairs. Make sure that carpet is firmly attached to the stairs. Fix any carpet that is loose or worn. What can I do on the outside of my home? Use bright outdoor lighting. Repair the edges of walkways and driveways and fix any cracks. Clear paths of anything that can make you trip, such as tools or rocks. Add color or contrast paint or tape to clearly mark and help you see high doorway thresholds. Trim any bushes or trees on the main path into your home. Check that handrails are securely fastened and in good repair. Both sides of all steps  should have handrails. Install guardrails along the edges of any raised decks or porches. Have leaves, snow, and ice cleared regularly. Use sand, salt, or ice melt on walkways during winter months if you live where there is ice and snow. In the garage, clean up any spills right away, including grease or oil spills. What other actions can I take? Review your medicines with your health care provider. Some medicines can make you confused or feel dizzy. This can increase your chance of falling. Wear closed-toe shoes that fit well and support your feet. Wear shoes that have rubber soles and low heels. Use a cane, walker, scooter, or crutches that help you move around if needed. Talk with your provider about other ways that you can decrease your risk of falls. This may include seeing a physical therapist to learn to do exercises to improve movement and strength. Where to find more information Centers for Disease Control and Prevention, STEAD[employer]: tonerpromos.no [employer] on Aging: baseringtones.pl National Institute on Aging: baseringtones.pl Contact a health care provider if: You are afraid of falling at home. You feel weak, drowsy, or dizzy at home. You fall at home. Get help right away if you: Lose consciousness or have trouble moving after a fall. Have a fall that causes a head injury. These symptoms may be an emergency. Get help right away. Call 911. Do not wait to see if the symptoms will go away. Do not drive yourself to the hospital. This information is not intended to replace advice given to you by your health care provider. Make sure you discuss any questions you have with your health care provider. Document Revised: 06/23/2022 Document Reviewed: 06/23/2022 Elsevier Patient Education  2024 [employer].

## 2024-11-19 ENCOUNTER — Other Ambulatory Visit: Payer: Self-pay | Admitting: Family Medicine

## 2024-11-19 DIAGNOSIS — E1165 Type 2 diabetes mellitus with hyperglycemia: Secondary | ICD-10-CM

## 2024-11-20 ENCOUNTER — Ambulatory Visit: Payer: Self-pay | Admitting: Family Medicine

## 2024-11-24 ENCOUNTER — Ambulatory Visit: Admitting: *Deleted

## 2024-11-24 VITALS — Ht 73.0 in | Wt 229.0 lb

## 2024-11-24 DIAGNOSIS — Z0000 Encounter for general adult medical examination without abnormal findings: Secondary | ICD-10-CM

## 2024-11-24 NOTE — Progress Notes (Signed)
 "  Chief Complaint  Patient presents with   Medicare Wellness     Subjective:   Edward Briggs is a 74 y.o. male who presents for a Medicare Annual Wellness Visit.  No voiced or noted concerns at this time Patient advised to keep follow-up appointment with PCP (02-16-2025)   Visit info / Clinical Intake: Medicare Wellness Visit Type:: Subsequent Annual Wellness Visit Persons participating in visit and providing information:: patient Medicare Wellness Visit Mode:: Telephone If telephone:: video declined Since this visit was completed virtually, some vitals may be partially provided or unavailable. Missing vitals are due to the limitations of the virtual format.: Unable to obtain vitals - no equipment If Telephone or Video please confirm:: I connected with patient using audio/video enable telemedicine. I verified patient identity with two identifiers, discussed telehealth limitations, and patient agreed to proceed. Patient Location:: home Provider Location:: office Interpreter Needed?: No Pre-visit prep was completed: no AWV questionnaire completed by patient prior to visit?: no Living arrangements:: lives with spouse/significant other Patient's Overall Health Status Rating: (!) fair Typical amount of pain: none Does pain affect daily life?: no Are you currently prescribed opioids?: no  Dietary Habits and Nutritional Risks How many meals a day?: 2 Eats fruit and vegetables daily?: yes Most meals are obtained by: preparing own meals; eating out In the last 2 weeks, have you had any of the following?: none Diabetic:: (!) yes Any non-healing wounds?: no How often do you check your BS?: 1 Would you like to be referred to a Nutritionist or for Diabetic Management? : no  Functional Status Activities of Daily Living (to include ambulation/medication): Independent Ambulation: Independent with device- listed below Home Assistive Devices/Equipment: Walker (specify Type) Medication  Administration: Independent Home Management (perform basic housework or laundry): Independent Manage your own finances?: yes Primary transportation is: family / friends Concerns about vision?: no *vision screening is required for WTM* Concerns about hearing?: no  Fall Screening Falls in the past year?: 1 Number of falls in past year: 0 Was there an injury with Fall?: 0 Fall Risk Category Calculator: 1 Patient Fall Risk Level: Low Fall Risk  Fall Risk Patient at Risk for Falls Due to: Impaired balance/gait; Impaired mobility Fall risk Follow up: Falls evaluation completed; Education provided; Falls prevention discussed  Home and Transportation Safety: All rugs have non-skid backing?: N/A, no rugs All stairs or steps have railings?: yes Grab bars in the bathtub or shower?: yes Have non-skid surface in bathtub or shower?: (!) no Good home lighting?: yes Regular seat belt use?: yes Hospital stays in the last year:: no  Cognitive Assessment Difficulty concentrating, remembering, or making decisions? : yes Will 6CIT or Mini Cog be Completed: yes What year is it?: 0 points What month is it?: 0 points Give patient an address phrase to remember (5 components): Its very sunny outside About what time is it?: 0 points Count backwards from 20 to 1: 2 points Say the months of the year in reverse: 4 points Repeat the address phrase from earlier: 2 points 6 CIT Score: 8 points  Advance Directives (For Healthcare) Does Patient Have a Medical Advance Directive?: No Would patient like information on creating a medical advance directive?: No - Patient declined  Reviewed/Updated  Reviewed/Updated: Reviewed All (Medical, Surgical, Family, Medications, Allergies, Care Teams, Patient Goals); Surgical History; Family History; Medications; Allergies; Care Teams; Patient Goals; Medical History    Allergies (verified) Patient has no known allergies.   Current Medications (verified) Outpatient  Encounter Medications as  of 11/24/2024  Medication Sig   Accu-Chek Softclix Lancets lancets Check twice a day   amitriptyline  (ELAVIL ) 10 MG tablet Take 1 tablet (10 mg total) by mouth at bedtime.   atorvastatin  (LIPITOR) 10 MG tablet Take 1 tablet (10 mg total) by mouth daily.   blood glucose meter kit and supplies Dispense based on patient and insurance preference. Use up to four times daily as directed. (FOR ICD-10 E10.9, E11.9).   Blood Glucose Monitoring Suppl (BLOOD GLUCOSE METER) kit Use as instructed   carbidopa -levodopa  (SINEMET  IR) 25-100 MG tablet Take 1 tablet by mouth 4 (four) times daily. Take 1 tablet by mouth at 11 AM, 2 PM, 5 PM, and at bedtime   donepezil  (ARICEPT ) 10 MG tablet TAKE 1 TABLET BY MOUTH AT BEDTIME   glucose blood test strip Check blood sugar 3 times daily   Incontinence Supply Disposable (DEPEND EASY FIT UNDERGARMENTS) MISC 1 Application by Does not apply route as needed.   Insulin  Pen Needle (BD PEN NEEDLE NANO U/F) 32G X 4 MM MISC USE DAILY AS DIRECTED   LANTUS  SOLOSTAR 100 UNIT/ML Solostar Pen INJECT 34 UNITS SUBCUTANEOUSLY ONCE DAILY   lisinopril  (ZESTRIL ) 5 MG tablet Take 1 tablet (5 mg total) by mouth daily.   Metformin  HCl 500 MG/5ML SOLN TAKE 10 ML BY MOUTH TWICE DAILY. APPOINTMENT REQUIRED FOR FUTURE REFILLS   triamcinolone  cream (KENALOG ) 0.1 % Apply 1 Application topically 2 (two) times daily.   TYLENOL  500 MG tablet Take 500-1,000 mg by mouth every 6 (six) hours as needed for mild pain (pain score 1-3) or headache.   aspirin  81 MG tablet Take 81 mg by mouth every 7 (seven) days. (Patient not taking: Reported on 11/24/2024)   No facility-administered encounter medications on file as of 11/24/2024.    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    Past Surgical History:  Procedure  Laterality Date   ABSCESS DRAINAGE     COLONOSCOPY     ~ 10 yrs ago   FRACTURE SURGERY     HARDWARE REMOVAL  03/26/2012   Procedure: HARDWARE REMOVAL;  Surgeon: Deward DELENA Schwartz, MD;  Location: [facility];  Service: Orthopedics;  Laterality: Left;  hardware removal deep left ankle syndesmotic screws only and stress xrays ankle    ORIF ANKLE FRACTURE  12/03/2011   Procedure: OPEN REDUCTION INTERNAL FIXATION (ORIF) ANKLE FRACTURE;  Surgeon: Deward DELENA Schwartz, MD;  Location: [facility];  Service: Orthopedics;  Laterality: Left;  left medial malleolus fracture   PILONIDAL CYST EXCISION  ~1982   SKIN GRAFT  ~1970   Lt great toe (graft from Lt thigh)   Family History  Problem Relation Age of Onset   Cancer Mother    Cancer Father    Colon cancer Neg Hx    Colon polyps Neg Hx    Esophageal cancer Neg Hx    Rectal cancer Neg Hx    Stomach cancer Neg Hx    Social History   Occupational History   Occupation: retired  Tobacco Use   Smoking status: Former    Current packs/day: 0.00    Types: Cigarettes    Quit date: 10/30/1988    Years since quitting: 36.0   Smokeless tobacco: Never  Vaping Use   Vaping  status: Never Used  Substance and Sexual Activity   Alcohol use: Yes    Alcohol/week: 1.0 standard drink of alcohol    Types: 1 Shots of liquor per week    Comment: every two weeks   Drug use: No   Sexual activity: Yes    Birth control/protection: None   Tobacco Counseling Counseling given: Not Answered  SDOH Screenings   Food Insecurity: No Food Insecurity (11/24/2024)  Housing: Low Risk (11/24/2024)  Transportation Needs: No Transportation Needs (11/24/2024)  Utilities: Not At Risk (11/24/2024)  Alcohol Screen: Low Risk (05/03/2024)  Depression (PHQ2-9): Low Risk (11/24/2024)  Financial Resource Strain: Low Risk (03/25/2023)  Physical Activity: Inactive (11/24/2024)  Social Connections: Moderately Integrated (11/24/2024)  Stress: No Stress Concern  Present (11/24/2024)  Tobacco Use: Medium Risk (11/24/2024)  Health Literacy: Adequate Health Literacy (11/24/2024)   See flowsheets for full screening details  Depression Screen PHQ 2 & 9 Depression Scale- Over the past 2 weeks, how often have you been bothered by any of the following problems? Little interest or pleasure in doing things: 0 Feeling down, depressed, or hopeless (PHQ Adolescent also includes...irritable): 0 PHQ-2 Total Score: 0 Trouble falling or staying asleep, or sleeping too much: 0 Feeling tired or having little energy: 0 Poor appetite or overeating (PHQ Adolescent also includes...weight loss): 0 Feeling bad about yourself - or that you are a failure or have let yourself or your family down: 0 Trouble concentrating on things, such as reading the newspaper or watching television (PHQ Adolescent also includes...like school work): 0 Moving or speaking so slowly that other people could have noticed. Or the opposite - being so fidgety or restless that you have been moving around a lot more than usual: 0 Thoughts that you would be better off dead, or of hurting yourself in some way: 0 PHQ-9 Total Score: 0 If you checked off any problems, how difficult have these problems made it for you to do your work, take care of things at home, or get along with other people?: Not difficult at all     Goals Addressed             This Visit's Progress    Weight (lb) < 200 lb (90.7 kg)   229 lb (103.9 kg)            Objective:    Today's Vitals   11/24/24 1012  Weight: 229 lb (103.9 kg)  Height: 6' 1 (1.854 m)   Body mass index is 30.21 kg/m.  Hearing/Vision screen Hearing Screening - Comments:: No trouble hearing Vision Screening - Comments:: [place]   Not up to date Immunizations and Health Maintenance Health Maintenance  Topic Date Due   COVID-19 Vaccine (6 - 2025-26 season) 07/04/2024   Diabetic kidney evaluation - Urine ACR  11/05/2024   FOOT EXAM  02/09/2025    OPHTHALMOLOGY EXAM  02/10/2025   HEMOGLOBIN A1C  05/17/2025   Diabetic kidney evaluation - eGFR measurement  11/17/2025   Medicare Annual Wellness (AWV)  11/24/2025   Colonoscopy  12/25/2031   DTaP/Tdap/Td (2 - Td or Tdap) 03/12/2033   Pneumococcal Vaccine: 50+ Years  Completed   Hepatitis C Screening  Completed   Meningococcal B Vaccine  Aged Out   Zoster Vaccines- Shingrix  Discontinued        Assessment/Plan:  This is a routine wellness examination for Lonald.  Patient Care Team: Levora Reyes SAUNDERS, MD as PCP - General (Family Medicine)  I have personally reviewed and noted the  following in the patients chart:   Medical and social history Use of alcohol, tobacco or illicit drugs  Current medications and supplements including opioid prescriptions. Functional ability and status Nutritional status Physical activity Advanced directives List of other physicians Hospitalizations, surgeries, and ER visits in previous 12 months Vitals Screenings to include cognitive, depression, and falls Referrals and appointments  No orders of the defined types were placed in this encounter.  In addition, I have reviewed and discussed with patient certain preventive protocols, quality metrics, and best practice recommendations. A written personalized care plan for preventive services as well as general preventive health recommendations were provided to patient.   Mliss Graff, LPN   [id number]   Return in 1 year (on 11/24/2025).  After Visit Summary: (MyChart) Due to this being a telephonic visit, the after visit summary with patients personalized plan was offered to patient via MyChart   Nurse Notes:  "

## 2024-11-24 NOTE — Patient Instructions (Signed)
 [name],  Thank you for taking the time for your Medicare Wellness Visit. I appreciate your continued commitment to your health goals. Please review the care plan we discussed, and feel free to reach out if I can assist you further.  Please note that Annual Wellness Visits do not include a physical exam. Some assessments may be limited, especially if the visit was conducted virtually. If needed, we may recommend an in-person follow-up with your provider.  Ongoing Care Seeing your primary care provider every 3 to 6 months helps us  monitor your health and provide consistent, personalized care.   Referrals If a referral was made during today's visit and you haven't received any updates within two weeks, please contact the referred provider directly to check on the status.  Recommended Screenings:  Health Maintenance  Topic Date Due   COVID-19 Vaccine (6 - 2025-26 season) 07/04/2024   Kidney health urinalysis for diabetes  11/05/2024   Complete foot exam   02/09/2025   Eye exam for diabetics  02/10/2025   Hemoglobin A1C  05/17/2025   Yearly kidney function blood test for diabetes  11/17/2025   Medicare Annual Wellness Visit  11/24/2025   Colon Cancer Screening  12/25/2031   DTaP/Tdap/Td vaccine (2 - Td or Tdap) 03/12/2033   Pneumococcal Vaccine for age over 68  Completed   Hepatitis C Screening  Completed   Meningitis B Vaccine  Aged Out   Zoster (Shingles) Vaccine  Discontinued       11/24/2024   10:13 AM  Advanced Directives  Does Patient Have a Medical Advance Directive? No  Would patient like information on creating a medical advance directive? No - Patient declined    Vision: Annual vision screenings are recommended for early detection of glaucoma, cataracts, and diabetic retinopathy. These exams can also reveal signs of chronic conditions such as diabetes and high blood pressure.  Dental: Annual dental screenings help detect early signs of oral cancer, gum disease, and  other conditions linked to overall health, including heart disease and diabetes.  Please see the attached documents for additional preventive care recommendations.    [name] , Thank you for taking time to come for your Medicare Wellness Visit. I appreciate your ongoing commitment to your health goals. Please review the following plan we discussed and let me know if I can assist you in the future.   Screening recommendations/referrals: Colonoscopy:  Recommended yearly ophthalmology/optometry visit for glaucoma screening and checkup Recommended yearly dental visit for hygiene and checkup  Vaccinations: Influenza vaccine:  Pneumococcal vaccine:  Tdap vaccine:  Shingles vaccine:         Preventive Care 65 Years and Older, Male Preventive care refers to lifestyle choices and visits with your health care provider that can promote health and wellness. What does preventive care include? A yearly physical exam. This is also called an annual well check. Dental exams once or twice a year. Routine eye exams. Ask your health care provider how often you should have your eyes checked. Personal lifestyle choices, including: Daily care of your teeth and gums. Regular physical activity. Eating a healthy diet. Avoiding tobacco and drug use. Limiting alcohol use. Practicing safe sex. Taking low doses of aspirin  every day. Taking vitamin and mineral supplements as recommended by your health care provider. What happens during an annual well check? The services and screenings done by your health care provider during your annual well check will depend on your age, overall health, lifestyle risk factors, and family history of  disease. Counseling  Your health care provider may ask you questions about your: Alcohol use. Tobacco use. Drug use. Emotional well-being. Home and relationship well-being. Sexual activity. Eating habits. History of falls. Memory and ability to understand  (cognition). Work and work astronomer. Screening  You may have the following tests or measurements: Height, weight, and BMI. Blood pressure. Lipid and cholesterol levels. These may be checked every 5 years, or more frequently if you are over 55 years old. Skin check. Lung cancer screening. You may have this screening every year starting at age 96 if you have a 30-pack-year history of smoking and currently smoke or have quit within the past 15 years. Fecal occult blood test (FOBT) of the stool. You may have this test every year starting at age 56. Flexible sigmoidoscopy or colonoscopy. You may have a sigmoidoscopy every 5 years or a colonoscopy every 10 years starting at age 63. Prostate cancer screening. Recommendations will vary depending on your family history and other risks. Hepatitis C blood test. Hepatitis B blood test. Sexually transmitted disease (STD) testing. Diabetes screening. This is done by checking your blood sugar (glucose) after you have not eaten for a while (fasting). You may have this done every 1-3 years. Abdominal aortic aneurysm (AAA) screening. You may need this if you are a current or former smoker. Osteoporosis. You may be screened starting at age 53 if you are at high risk. Talk with your health care provider about your test results, treatment options, and if necessary, the need for more tests. Vaccines  Your health care provider may recommend certain vaccines, such as: Influenza vaccine. This is recommended every year. Tetanus, diphtheria, and acellular pertussis (Tdap, Td) vaccine. You may need a Td booster every 10 years. Zoster vaccine. You may need this after age 67. Pneumococcal 13-valent conjugate (PCV13) vaccine. One dose is recommended after age 22. Pneumococcal polysaccharide (PPSV23) vaccine. One dose is recommended after age 52. Talk to your health care provider about which screenings and vaccines you need and how often you need them. This  information is not intended to replace advice given to you by your health care provider. Make sure you discuss any questions you have with your health care provider. Document Released: 11/16/2015 Document Revised: 07/09/2016 Document Reviewed: 08/21/2015 Elsevier Interactive Patient Education  2017 [employer].  Fall Prevention in the Home Falls can cause injuries. They can happen to people of all ages. There are many things you can do to make your home safe and to help prevent falls. What can I do on the outside of my home? Regularly fix the edges of walkways and driveways and fix any cracks. Remove anything that might make you trip as you walk through a door, such as a raised step or threshold. Trim any bushes or trees on the path to your home. Use bright outdoor lighting. Clear any walking paths of anything that might make someone trip, such as rocks or tools. Regularly check to see if handrails are loose or broken. Make sure that both sides of any steps have handrails. Any raised decks and porches should have guardrails on the edges. Have any leaves, snow, or ice cleared regularly. Use sand or salt on walking paths during winter. Clean up any spills in your garage right away. This includes oil or grease spills. What can I do in the bathroom? Use night lights. Install grab bars by the toilet and in the tub and shower. Do not use towel bars as grab bars. Use non-skid  mats or decals in the tub or shower. If you need to sit down in the shower, use a plastic, non-slip stool. Keep the floor dry. Clean up any water that spills on the floor as soon as it happens. Remove soap buildup in the tub or shower regularly. Attach bath mats securely with double-sided non-slip rug tape. Do not have throw rugs and other things on the floor that can make you trip. What can I do in the bedroom? Use night lights. Make sure that you have a light by your bed that is easy to reach. Do not use any sheets or  blankets that are too big for your bed. They should not hang down onto the floor. Have a firm chair that has side arms. You can use this for support while you get dressed. Do not have throw rugs and other things on the floor that can make you trip. What can I do in the kitchen? Clean up any spills right away. Avoid walking on wet floors. Keep items that you use a lot in easy-to-reach places. If you need to reach something above you, use a strong step stool that has a grab bar. Keep electrical cords out of the way. Do not use floor polish or wax that makes floors slippery. If you must use wax, use non-skid floor wax. Do not have throw rugs and other things on the floor that can make you trip. What can I do with my stairs? Do not leave any items on the stairs. Make sure that there are handrails on both sides of the stairs and use them. Fix handrails that are broken or loose. Make sure that handrails are as long as the stairways. Check any carpeting to make sure that it is firmly attached to the stairs. Fix any carpet that is loose or worn. Avoid having throw rugs at the top or bottom of the stairs. If you do have throw rugs, attach them to the floor with carpet tape. Make sure that you have a light switch at the top of the stairs and the bottom of the stairs. If you do not have them, ask someone to add them for you. What else can I do to help prevent falls? Wear shoes that: Do not have high heels. Have rubber bottoms. Are comfortable and fit you well. Are closed at the toe. Do not wear sandals. If you use a stepladder: Make sure that it is fully opened. Do not climb a closed stepladder. Make sure that both sides of the stepladder are locked into place. Ask someone to hold it for you, if possible. Clearly mark and make sure that you can see: Any grab bars or handrails. First and last steps. Where the edge of each step is. Use tools that help you move around (mobility aids) if they are  needed. These include: Canes. Walkers. Scooters. Crutches. Turn on the lights when you go into a dark area. Replace any light bulbs as soon as they burn out. Set up your furniture so you have a clear path. Avoid moving your furniture around. If any of your floors are uneven, fix them. If there are any pets around you, be aware of where they are. Review your medicines with your doctor. Some medicines can make you feel dizzy. This can increase your chance of falling. Ask your doctor what other things that you can do to help prevent falls. This information is not intended to replace advice given to you by your health care provider.  Make sure you discuss any questions you have with your health care provider. Document Released: 08/16/2009 Document Revised: 03/27/2016 Document Reviewed: 11/24/2014 Elsevier Interactive Patient Education  2017 [employer].

## 2024-11-28 NOTE — Progress Notes (Signed)
 Lab results have been discussed.   Verbalized understanding? Yes  Are there any questions? No

## 2025-01-03 ENCOUNTER — Ambulatory Visit: Admitting: Podiatry

## 2025-02-16 ENCOUNTER — Ambulatory Visit: Admitting: Family Medicine

## 2025-04-06 ENCOUNTER — Ambulatory Visit: Admitting: Neurology
